# Patient Record
Sex: Female | Born: 1935
Health system: Southern US, Community
[De-identification: ages and names within clinical notes are randomized; demographics above are authoritative.]

## PROBLEM LIST (undated history)

## (undated) DIAGNOSIS — C50919 Malignant neoplasm of unspecified site of unspecified female breast: Secondary | ICD-10-CM

## (undated) DIAGNOSIS — K222 Esophageal obstruction: Secondary | ICD-10-CM

## (undated) DIAGNOSIS — F419 Anxiety disorder, unspecified: Secondary | ICD-10-CM

## (undated) DIAGNOSIS — F32A Depression, unspecified: Secondary | ICD-10-CM

## (undated) DIAGNOSIS — Z973 Presence of spectacles and contact lenses: Secondary | ICD-10-CM

## (undated) DIAGNOSIS — K219 Gastro-esophageal reflux disease without esophagitis: Secondary | ICD-10-CM

## (undated) DIAGNOSIS — I2510 Atherosclerotic heart disease of native coronary artery without angina pectoris: Secondary | ICD-10-CM

## (undated) DIAGNOSIS — I5189 Other ill-defined heart diseases: Secondary | ICD-10-CM

## (undated) DIAGNOSIS — K6289 Other specified diseases of anus and rectum: Secondary | ICD-10-CM

## (undated) DIAGNOSIS — H9190 Unspecified hearing loss, unspecified ear: Secondary | ICD-10-CM

## (undated) DIAGNOSIS — R0602 Shortness of breath: Secondary | ICD-10-CM

## (undated) DIAGNOSIS — Z972 Presence of dental prosthetic device (complete) (partial): Secondary | ICD-10-CM

## (undated) DIAGNOSIS — H5440 Blindness, one eye, unspecified eye: Secondary | ICD-10-CM

## (undated) DIAGNOSIS — N8189 Other female genital prolapse: Secondary | ICD-10-CM

## (undated) DIAGNOSIS — M1990 Unspecified osteoarthritis, unspecified site: Secondary | ICD-10-CM

## (undated) DIAGNOSIS — K529 Noninfective gastroenteritis and colitis, unspecified: Secondary | ICD-10-CM

## (undated) DIAGNOSIS — Z9889 Other specified postprocedural states: Secondary | ICD-10-CM

## (undated) DIAGNOSIS — F329 Major depressive disorder, single episode, unspecified: Secondary | ICD-10-CM

## (undated) DIAGNOSIS — IMO0002 Reserved for concepts with insufficient information to code with codable children: Secondary | ICD-10-CM

## (undated) HISTORY — PX: EYE SURGERY: SHX253

## (undated) HISTORY — PX: CORONARY STENT PLACEMENT: SHX1402

## (undated) HISTORY — PX: ABDOMINAL HYSTERECTOMY: SHX81

## (undated) HISTORY — DX: Other specified postprocedural states: Z98.890

## (undated) HISTORY — PX: APPENDECTOMY: SHX54

## (undated) HISTORY — DX: Unspecified osteoarthritis, unspecified site: M19.90

## (undated) HISTORY — PX: CHOLECYSTECTOMY: SHX55

## (undated) HISTORY — DX: Other specified diseases of anus and rectum: K62.89

## (undated) HISTORY — DX: Other ill-defined heart diseases: I51.89

## (undated) HISTORY — DX: Malignant neoplasm of unspecified site of unspecified female breast: C50.919

## (undated) HISTORY — DX: Noninfective gastroenteritis and colitis, unspecified: K52.9

## (undated) HISTORY — DX: Reserved for concepts with insufficient information to code with codable children: IMO0002

## (undated) HISTORY — DX: Esophageal obstruction: K22.2

## (undated) HISTORY — DX: Gastro-esophageal reflux disease without esophagitis: K21.9

## (undated) HISTORY — DX: Other female genital prolapse: N81.89

---

## 1976-11-25 HISTORY — PX: RETINAL DETACHMENT SURGERY: SHX105

## 1991-11-26 HISTORY — PX: LUMBAR DISC SURGERY: SHX700

## 1996-11-25 HISTORY — PX: CARDIAC CATHETERIZATION: SHX172

## 2001-03-06 ENCOUNTER — Ambulatory Visit (HOSPITAL_COMMUNITY): Admission: RE | Admit: 2001-03-06 | Discharge: 2001-03-06 | Payer: Self-pay | Admitting: Internal Medicine

## 2001-03-06 ENCOUNTER — Encounter: Payer: Self-pay | Admitting: Internal Medicine

## 2001-04-14 ENCOUNTER — Ambulatory Visit (HOSPITAL_COMMUNITY): Admission: RE | Admit: 2001-04-14 | Discharge: 2001-04-14 | Payer: Self-pay | Admitting: Internal Medicine

## 2001-04-14 ENCOUNTER — Encounter: Payer: Self-pay | Admitting: Internal Medicine

## 2001-09-18 ENCOUNTER — Ambulatory Visit (HOSPITAL_COMMUNITY): Admission: RE | Admit: 2001-09-18 | Discharge: 2001-09-18 | Payer: Self-pay | Admitting: Internal Medicine

## 2001-09-18 ENCOUNTER — Encounter: Payer: Self-pay | Admitting: Internal Medicine

## 2001-09-22 ENCOUNTER — Ambulatory Visit (HOSPITAL_COMMUNITY): Admission: RE | Admit: 2001-09-22 | Discharge: 2001-09-22 | Payer: Self-pay | Admitting: Internal Medicine

## 2001-09-22 ENCOUNTER — Encounter: Payer: Self-pay | Admitting: Internal Medicine

## 2001-12-23 ENCOUNTER — Ambulatory Visit (HOSPITAL_COMMUNITY): Admission: RE | Admit: 2001-12-23 | Discharge: 2001-12-23 | Payer: Self-pay | Admitting: Internal Medicine

## 2001-12-23 ENCOUNTER — Encounter: Payer: Self-pay | Admitting: Internal Medicine

## 2002-06-03 ENCOUNTER — Inpatient Hospital Stay (HOSPITAL_COMMUNITY): Admission: AD | Admit: 2002-06-03 | Discharge: 2002-06-06 | Payer: Self-pay | Admitting: Internal Medicine

## 2002-06-03 ENCOUNTER — Encounter: Payer: Self-pay | Admitting: Internal Medicine

## 2002-09-30 ENCOUNTER — Emergency Department (HOSPITAL_COMMUNITY): Admission: EM | Admit: 2002-09-30 | Discharge: 2002-09-30 | Payer: Self-pay | Admitting: Emergency Medicine

## 2003-02-22 ENCOUNTER — Encounter: Payer: Self-pay | Admitting: Internal Medicine

## 2003-02-22 ENCOUNTER — Ambulatory Visit (HOSPITAL_COMMUNITY): Admission: RE | Admit: 2003-02-22 | Discharge: 2003-02-22 | Payer: Self-pay | Admitting: Internal Medicine

## 2003-05-17 ENCOUNTER — Ambulatory Visit (HOSPITAL_COMMUNITY): Admission: RE | Admit: 2003-05-17 | Discharge: 2003-05-17 | Payer: Self-pay | Admitting: Internal Medicine

## 2003-05-17 ENCOUNTER — Encounter: Payer: Self-pay | Admitting: Internal Medicine

## 2003-08-18 ENCOUNTER — Encounter: Payer: Self-pay | Admitting: Internal Medicine

## 2003-08-18 ENCOUNTER — Ambulatory Visit (HOSPITAL_COMMUNITY): Admission: RE | Admit: 2003-08-18 | Discharge: 2003-08-18 | Payer: Self-pay | Admitting: Internal Medicine

## 2003-09-06 ENCOUNTER — Ambulatory Visit (HOSPITAL_COMMUNITY): Admission: RE | Admit: 2003-09-06 | Discharge: 2003-09-06 | Payer: Self-pay | Admitting: Internal Medicine

## 2003-09-06 ENCOUNTER — Encounter: Payer: Self-pay | Admitting: Internal Medicine

## 2003-09-15 ENCOUNTER — Encounter: Payer: Self-pay | Admitting: Internal Medicine

## 2003-09-15 ENCOUNTER — Ambulatory Visit (HOSPITAL_COMMUNITY): Admission: RE | Admit: 2003-09-15 | Discharge: 2003-09-15 | Payer: Self-pay | Admitting: Internal Medicine

## 2004-01-18 ENCOUNTER — Ambulatory Visit (HOSPITAL_COMMUNITY): Admission: RE | Admit: 2004-01-18 | Discharge: 2004-01-18 | Payer: Self-pay | Admitting: Internal Medicine

## 2004-01-18 IMAGING — CR DG CHEST 2V
2 series · 2 of 2 positions shown · non-contrast
Comparison: none

CLINICAL DATA: Bronchitis.
 PA AND LATERAL CHEST
 Comparison [DATE].  
 The heart size and vascularity are normal.  There is some chronic accentuation of the interstitial markings in the right lung, more prominent than on the prior study of [DATE] but there are no consolidative infiltrates or effusions.  There is some minimal scarring at the left lung base which is stable.  Interstitial markings are accentuated in the right upper lobe as well on a chronic basis.  
 IMPRESSION
 Accentuated interstitial markings in the right lung, progressed since the prior study.  No consolidative infiltrates. The findings are consistent with progressive chronic bronchitis, on the right.

[view not recorded (1 of 2)]
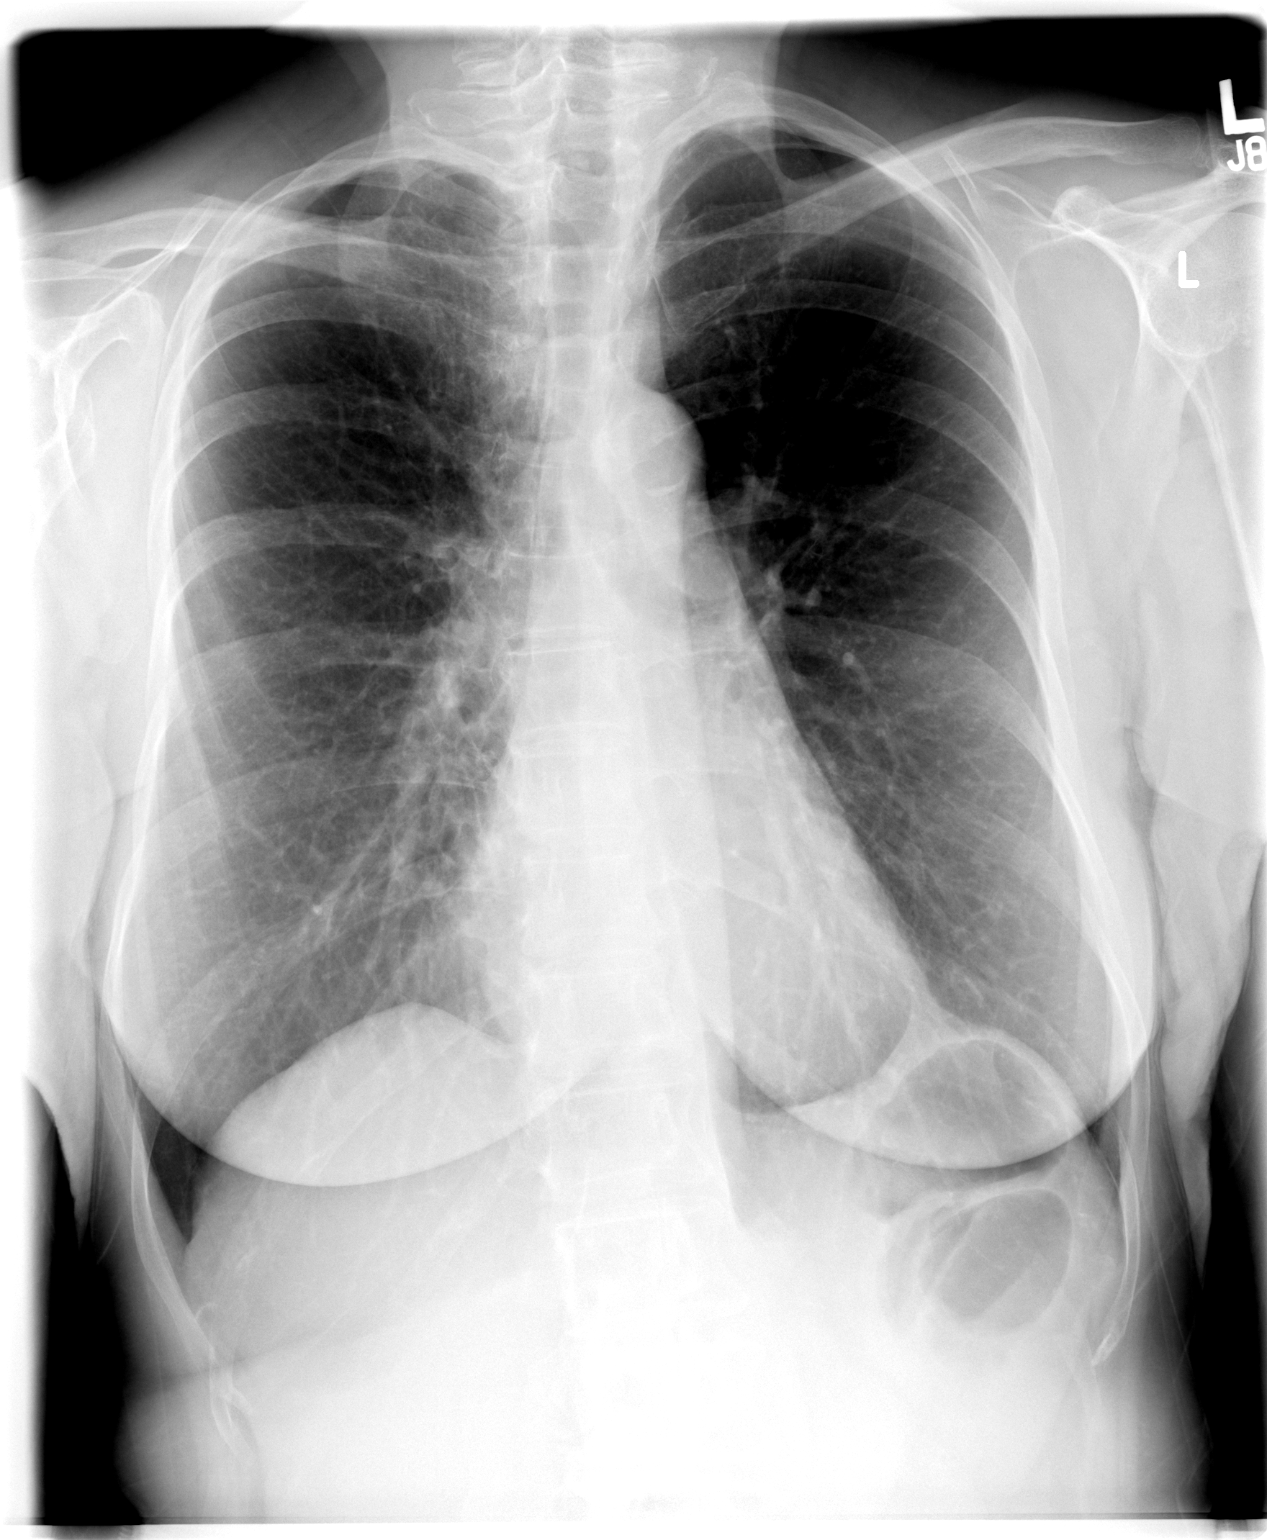

[view not recorded (2 of 2)]
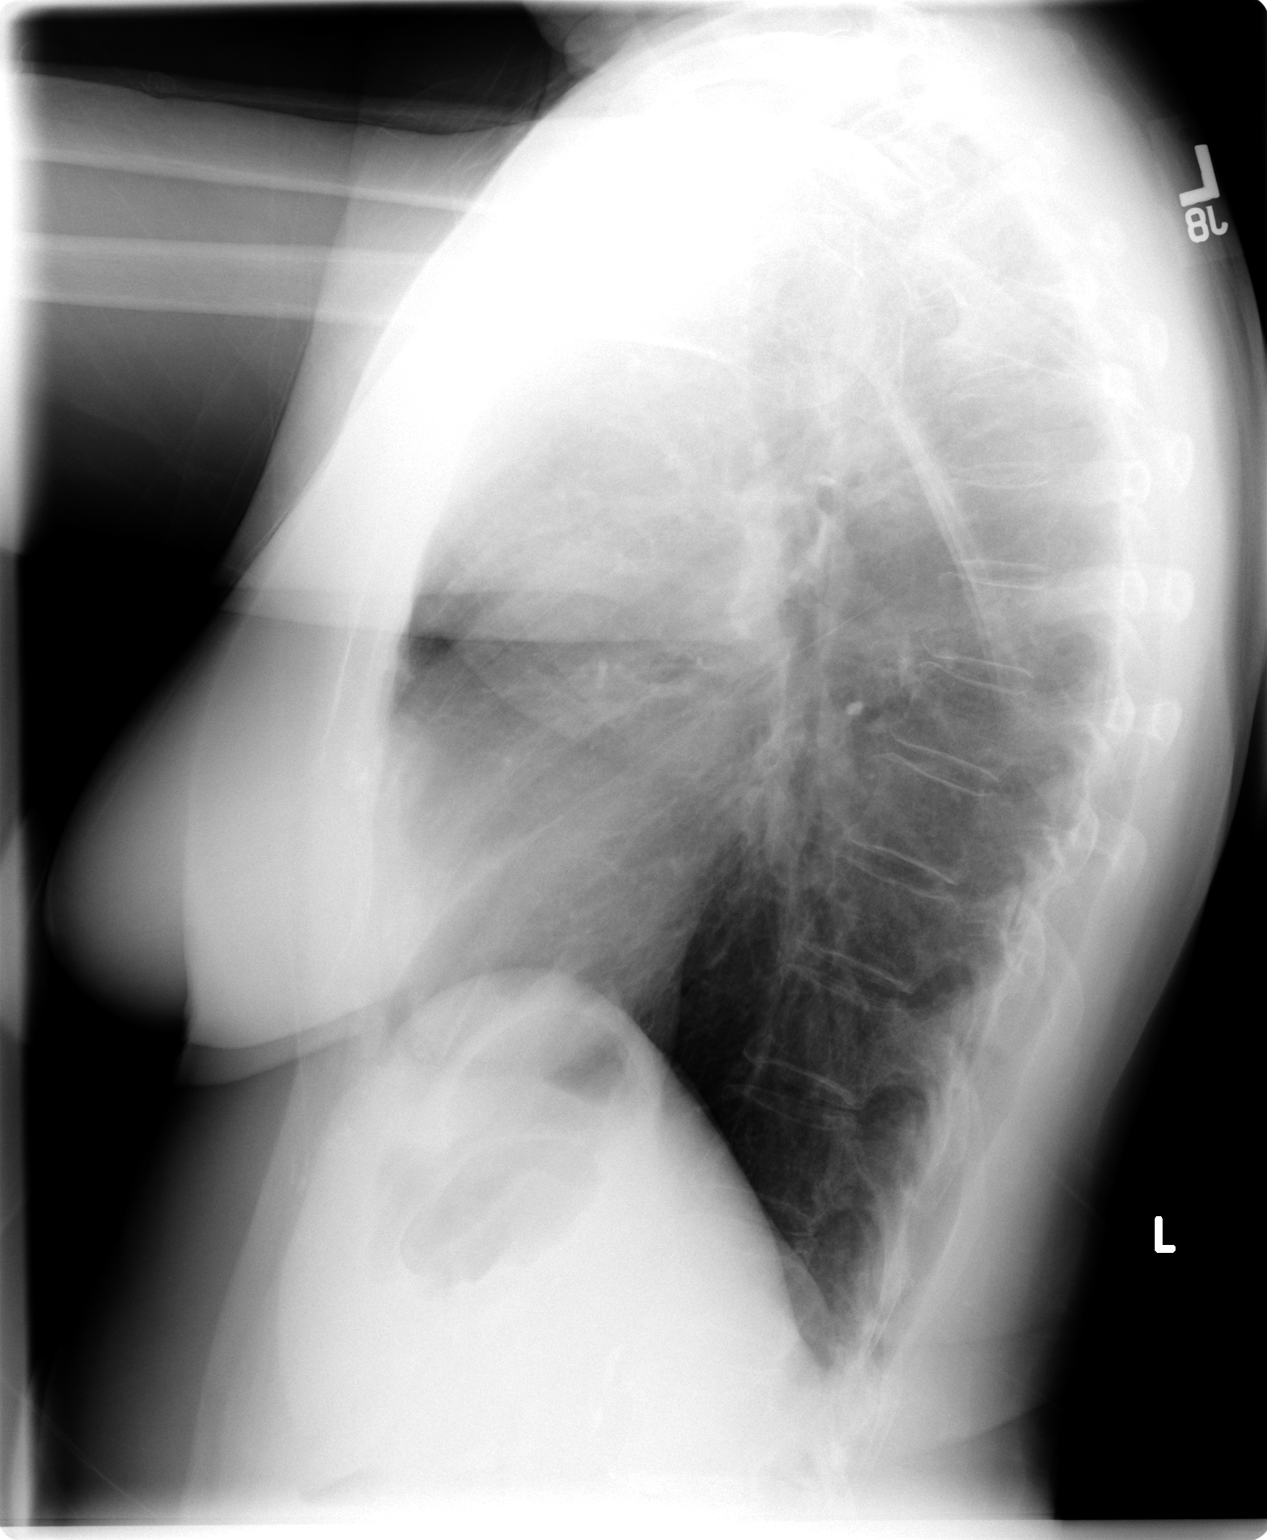

[2 of 2 positions shown; findings below may reference images not displayed]

## 2004-06-05 ENCOUNTER — Ambulatory Visit (HOSPITAL_COMMUNITY): Admission: RE | Admit: 2004-06-05 | Discharge: 2004-06-05 | Payer: Self-pay | Admitting: Internal Medicine

## 2004-06-05 IMAGING — CT CT HEAD W/O CM
1 series · 16 of 30 positions shown, 20 images · non-contrast
Comparison: none

CLINICAL DATA: Sinusitis, headache.
 CT HEAD WITHOUT CONTRAST 
 Routine noncontrast CT head compared to [DATE].  Mild atrophy.  Normal ventricular morphology.  No midline shift or mass effect.  Normal appearance of brain parenchyma without mass, hemorrhage, or infarct.  No extraaxial fluid collection.  Small air fluid level, right maxillary sinus.  Bones unremarkable. 
 IMPRESSION
 Right maxillary sinusitis.  No acute intracranial abnormalities.

[Series 9271: — · axial · 0.43mm/px · z∈[-753,-618]mm · 16 of 30 slices shown, 20 images]
[im 2/30  brain]
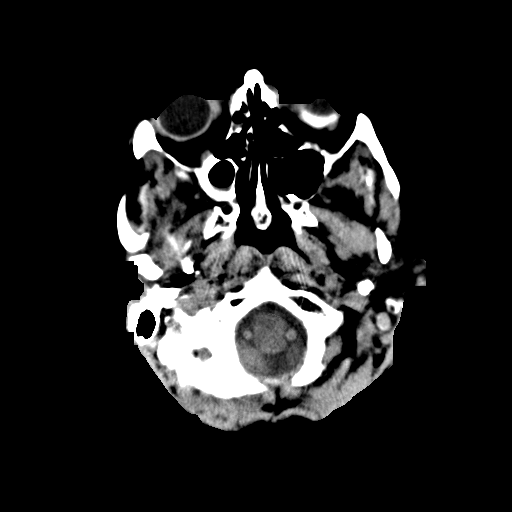
[im 2/30  bone]
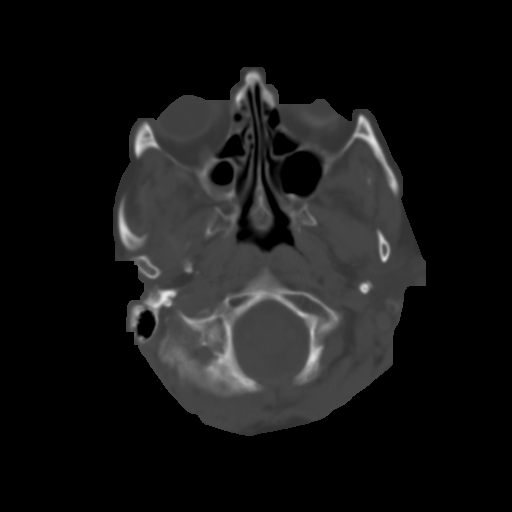
[im 4/30  brain]
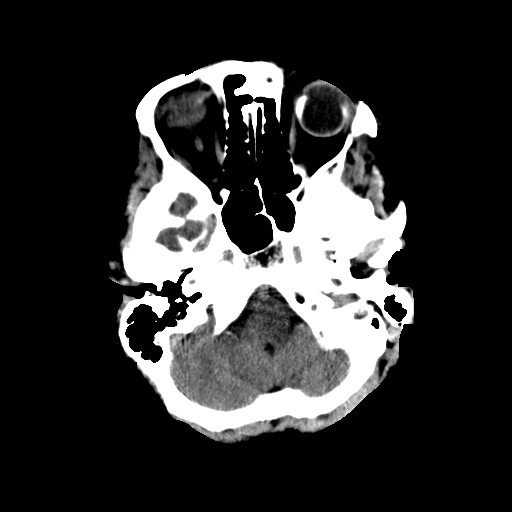
[im 6/30  brain]
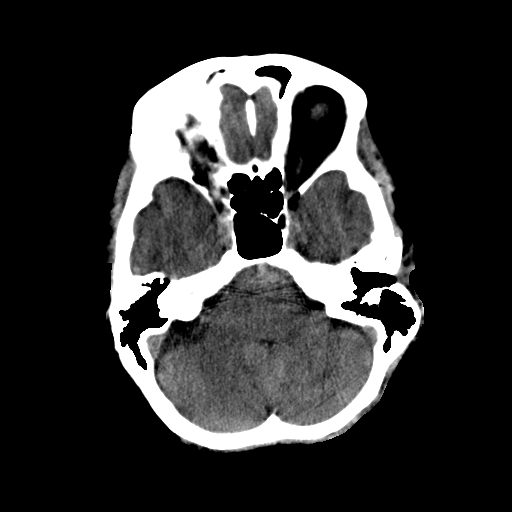
[im 8/30  brain]
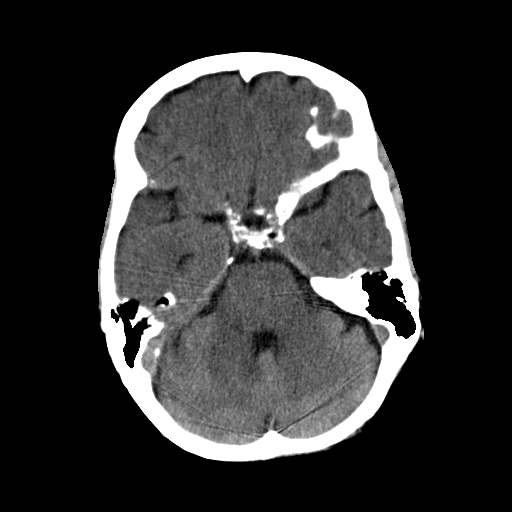
[im 9/30  brain]
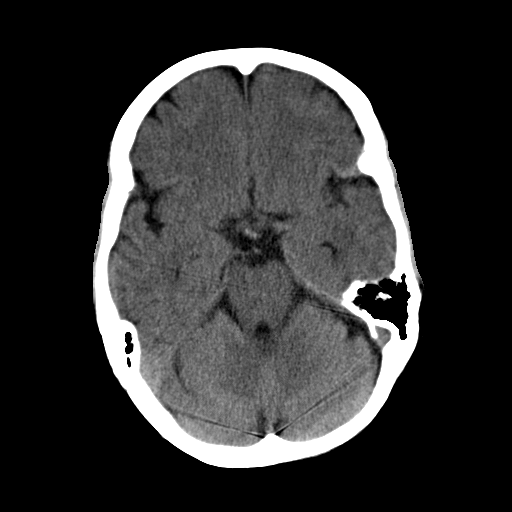
[im 9/30  bone]
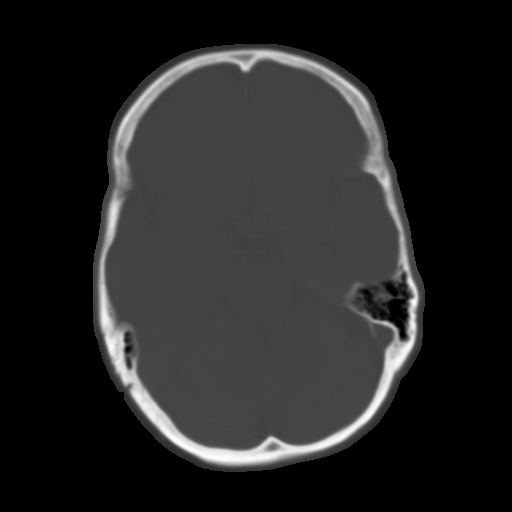
[im 11/30  brain]
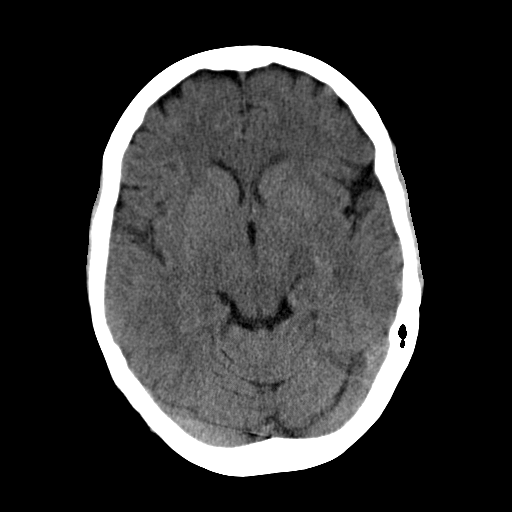
[im 13/30  brain]
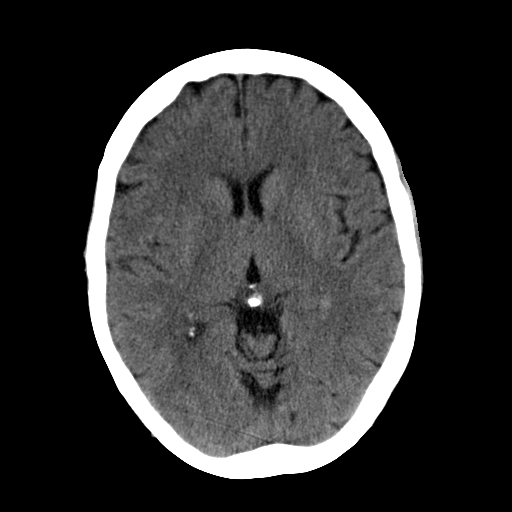
[im 15/30  brain]
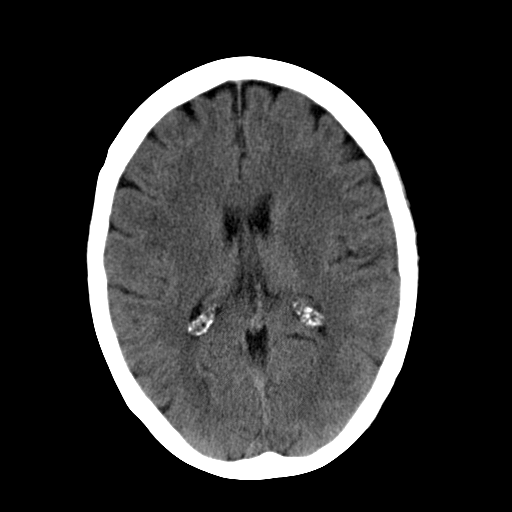
[im 16/30  brain]
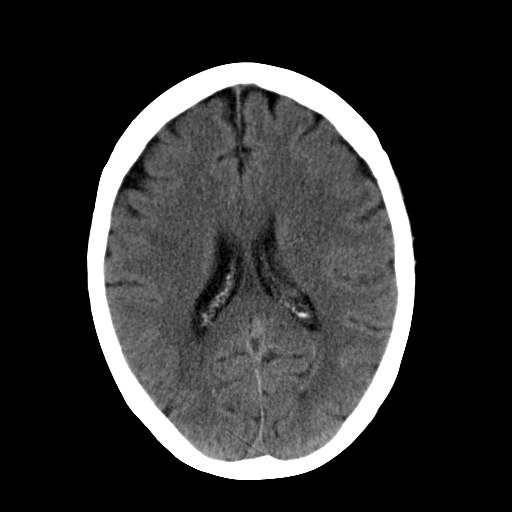
[im 16/30  bone]
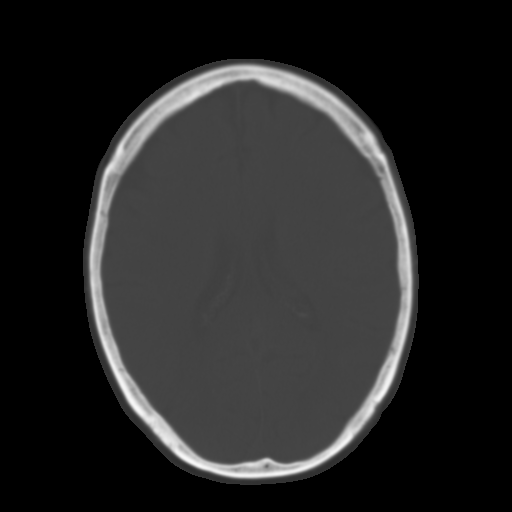
[im 18/30  brain]
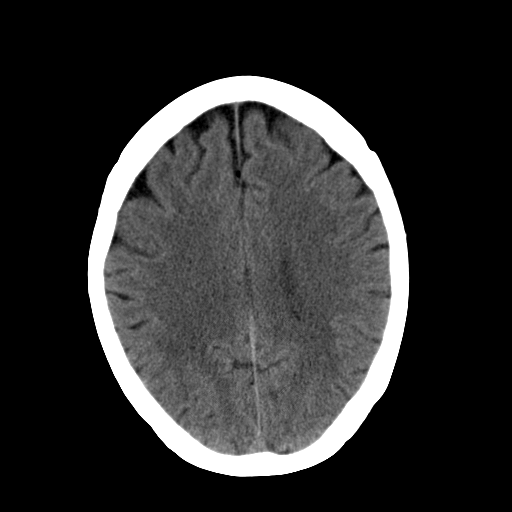
[im 20/30  brain]
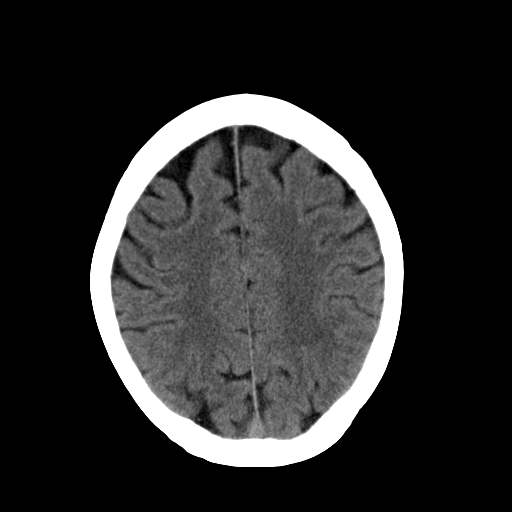
[im 22/30  brain]
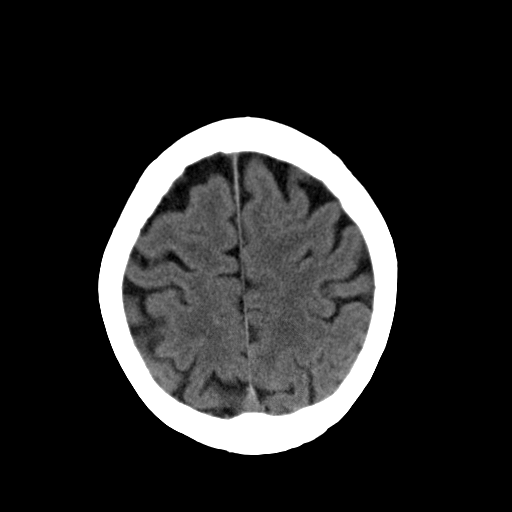
[im 23/30  brain]
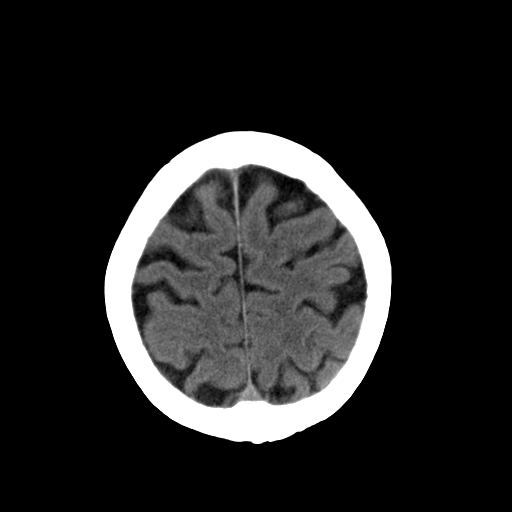
[im 23/30  bone]
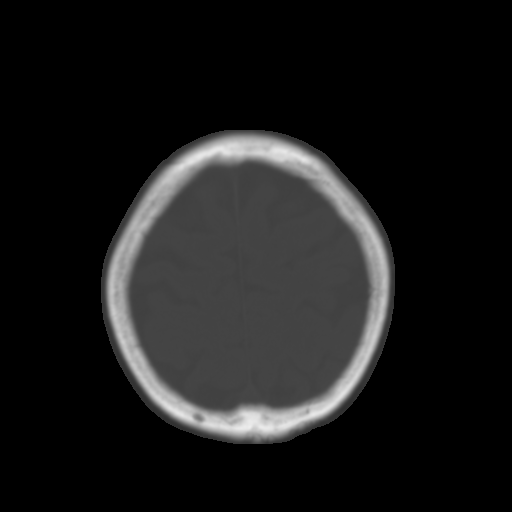
[im 25/30  brain]
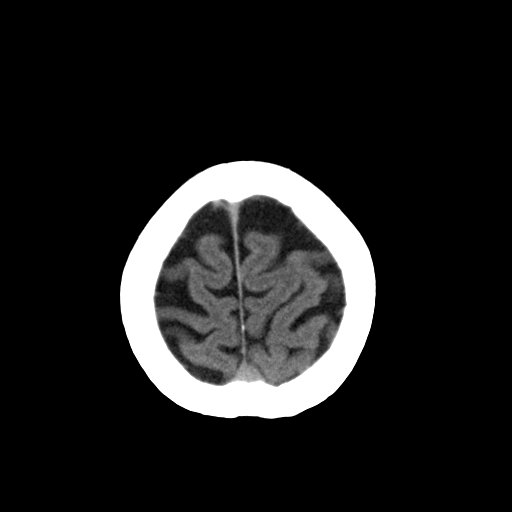
[im 27/30  brain]
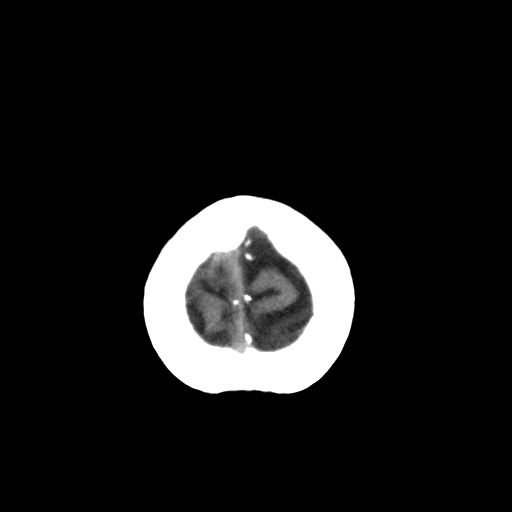
[im 29/30  brain]
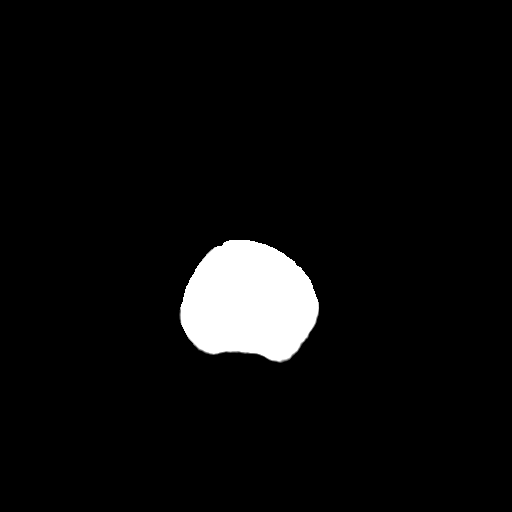

[16 of 30 positions shown; findings below may reference images not displayed]

## 2004-10-25 ENCOUNTER — Ambulatory Visit (HOSPITAL_COMMUNITY): Admission: RE | Admit: 2004-10-25 | Discharge: 2004-10-25 | Payer: Self-pay | Admitting: Internal Medicine

## 2005-07-01 ENCOUNTER — Ambulatory Visit (HOSPITAL_COMMUNITY): Admission: RE | Admit: 2005-07-01 | Discharge: 2005-07-01 | Payer: Self-pay | Admitting: Internal Medicine

## 2005-07-01 IMAGING — CT CT HEAD W/O CM
1 series · 16 of 30 positions shown, 20 images · IV contrast (agent unspecified)
Comparison: [DATE].

CLINICAL DATA: Headaches/struck top of head on cabinet. 
 HEAD CT WITHOUT CONTRAST:
TECHNIQUE: 5mm collimated images were obtained from the base of the skull through the vertex according to standard protocol without contrast.

[Series 6471: — · axial · 0.45mm/px · z∈[-665,-530]mm · 16 of 30 slices shown, 20 images]
[im 2/30  brain]
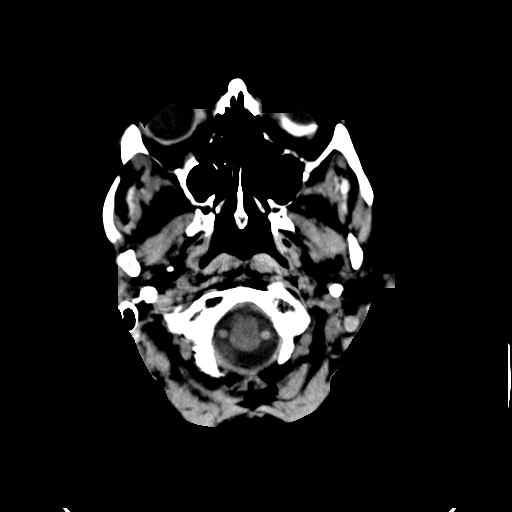
[im 2/30  bone]
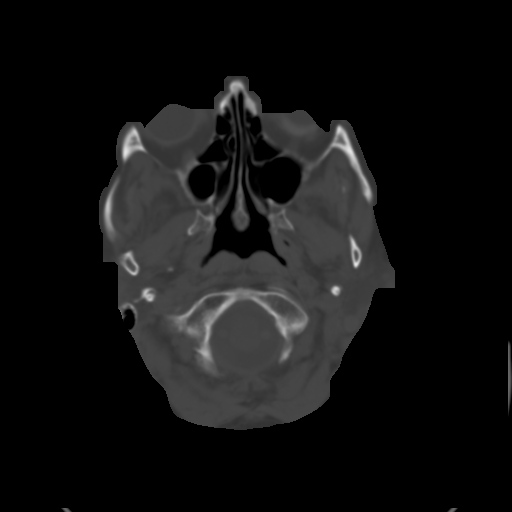
[im 4/30  brain]
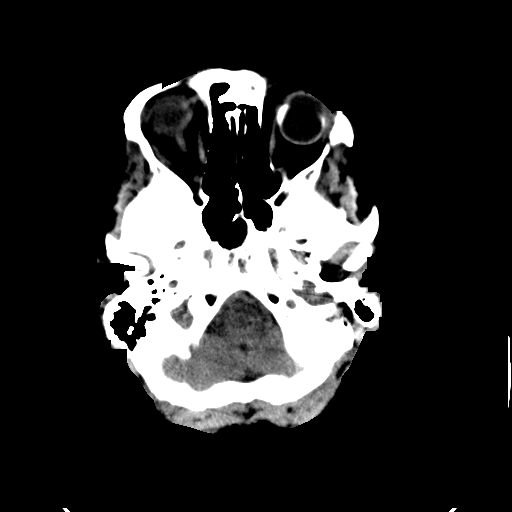
[im 6/30  brain]
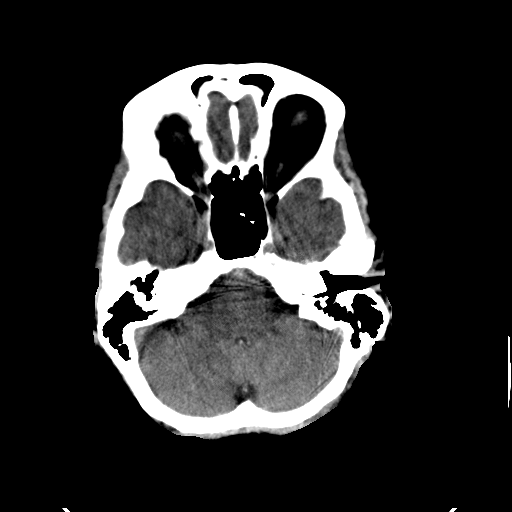
[im 8/30  brain]
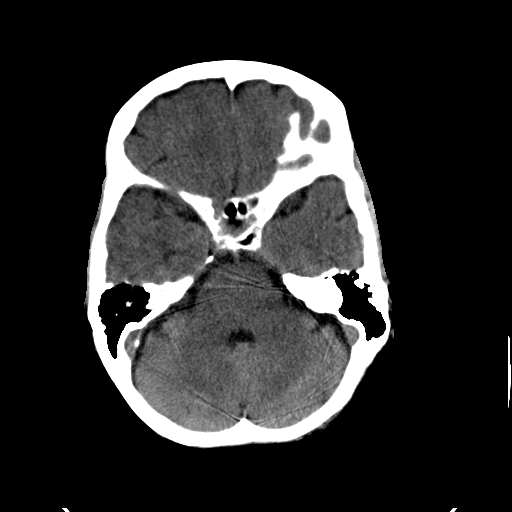
[im 9/30  brain]
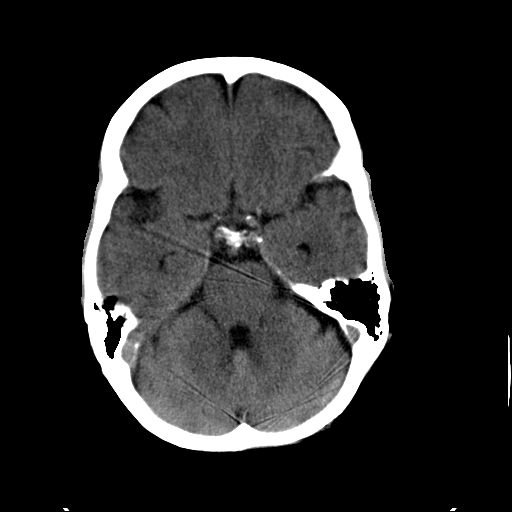
[im 9/30  bone]
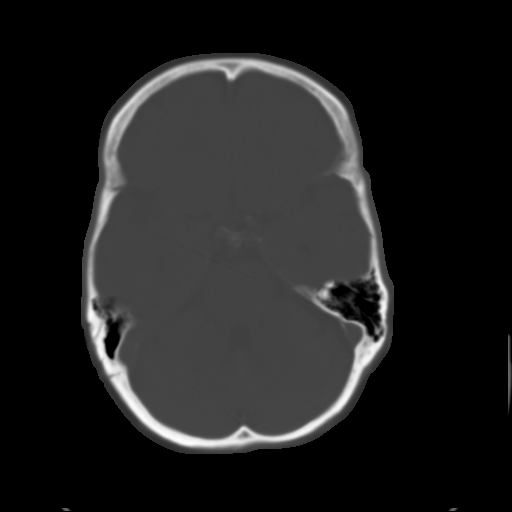
[im 11/30  brain]
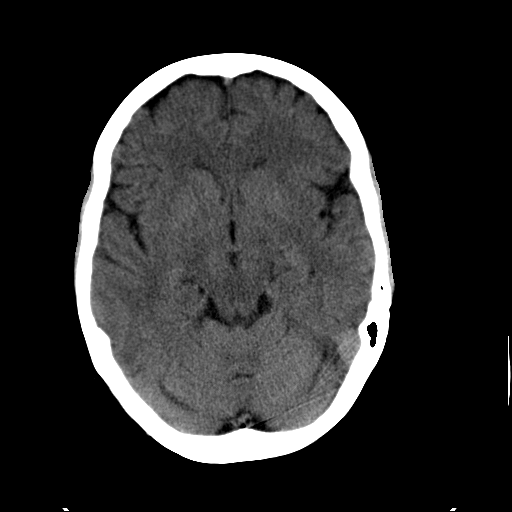
[im 13/30  brain]
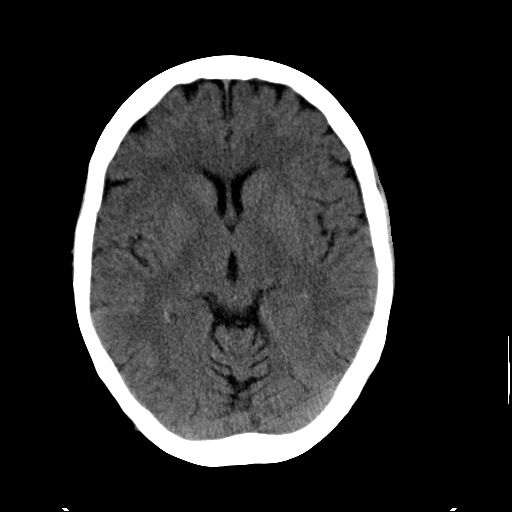
[im 15/30  brain]
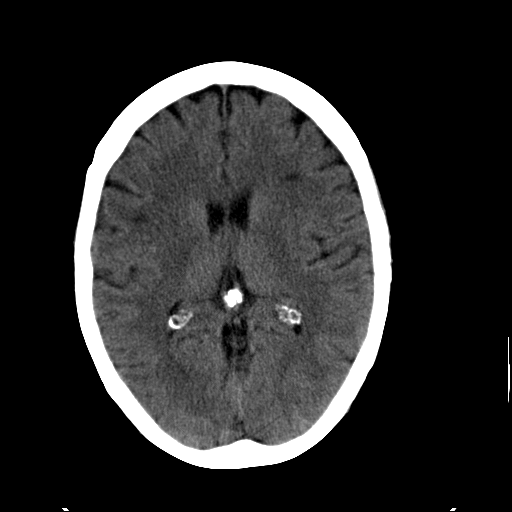
[im 16/30  brain]
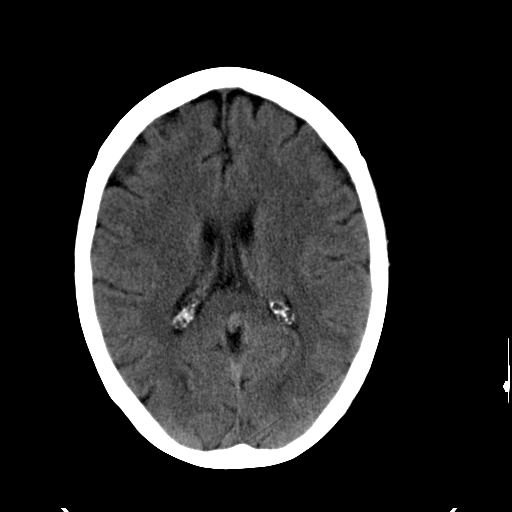
[im 16/30  bone]
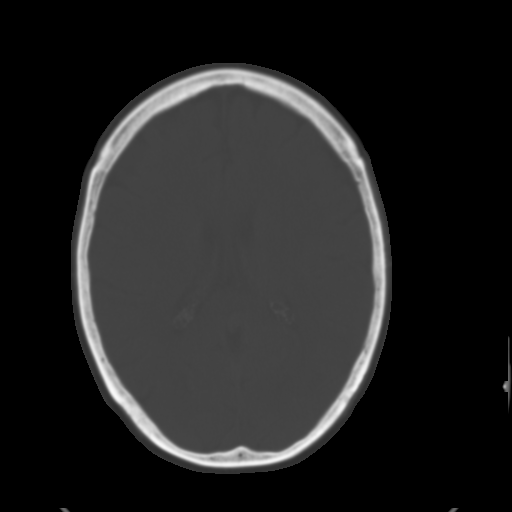
[im 18/30  brain]
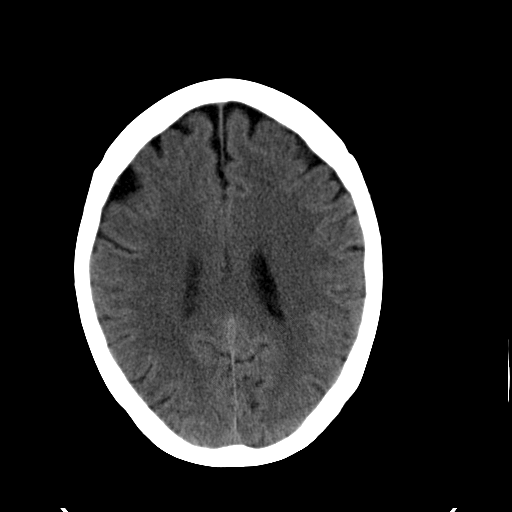
[im 20/30  brain]
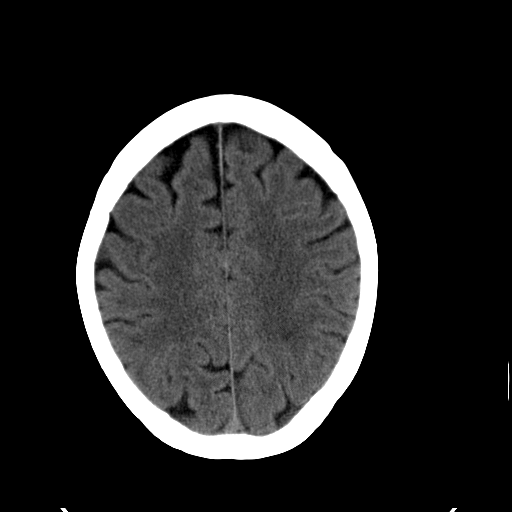
[im 22/30  brain]
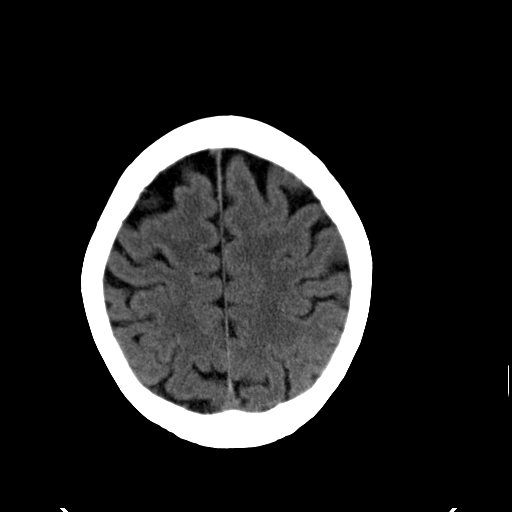
[im 23/30  brain]
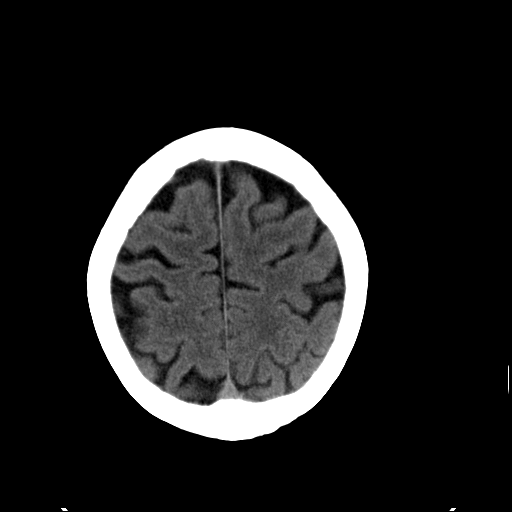
[im 23/30  bone]
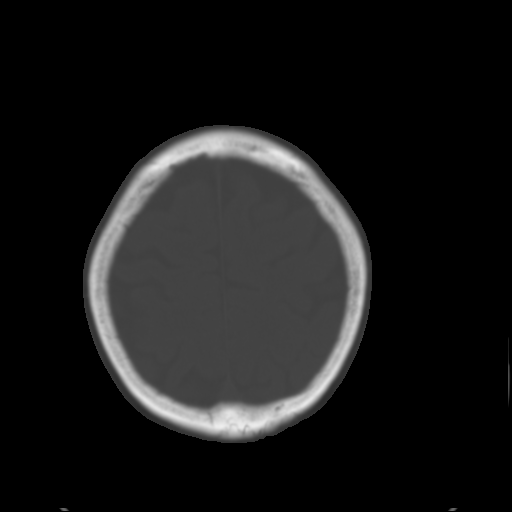
[im 25/30  brain]
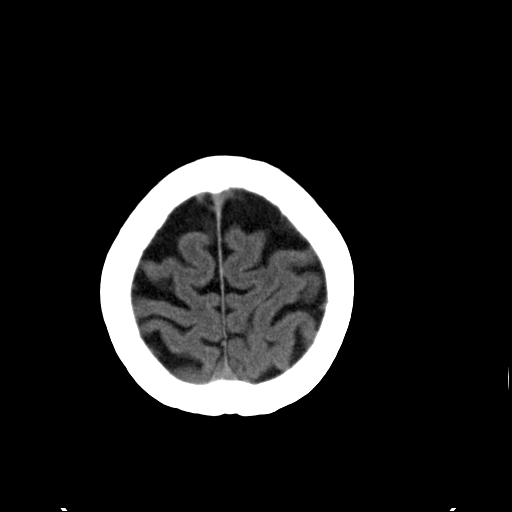
[im 27/30  brain]
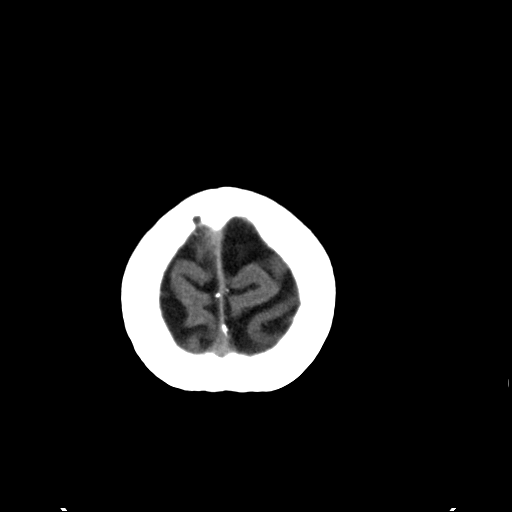
[im 29/30  brain]
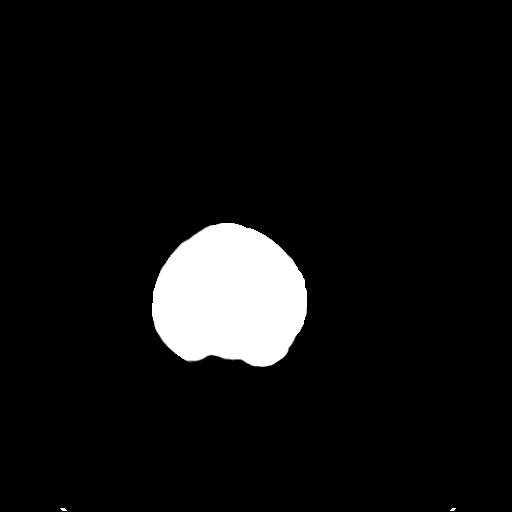

[16 of 30 positions shown; findings below may reference images not displayed]

No obvious acute abnormality. There is a small lucency in the white matter of the left frontal lobe, seen on image #15. This may be a new finding since the prior study, although it is possible that it was present but less obvious.  
 Calvarium intact.  Right maxillary sinusitis has resolved since the prior exam.
IMPRESSION: Probable small infarct in the white matter of the left frontal lobe.  Probably present previously but more prominent now.  Otherwise unremarkable.

## 2005-09-12 ENCOUNTER — Other Ambulatory Visit: Admission: RE | Admit: 2005-09-12 | Discharge: 2005-09-12 | Payer: Self-pay | Admitting: Unknown Physician Specialty

## 2005-12-30 ENCOUNTER — Ambulatory Visit (HOSPITAL_COMMUNITY): Admission: RE | Admit: 2005-12-30 | Discharge: 2005-12-30 | Payer: Self-pay | Admitting: Internal Medicine

## 2006-01-02 ENCOUNTER — Encounter: Admission: RE | Admit: 2006-01-02 | Discharge: 2006-01-02 | Payer: Self-pay | Admitting: Internal Medicine

## 2006-01-02 IMAGING — MG MM MAMMO DIAG UNILATERAL*L*
2 series · 2 of 2 positions shown · non-contrast
Comparison: none

DIGITAL UNILATERAL LIMITED LEFT DIAGNOSTIC MAMMOGRAM AND LEFT BREAST ULTRASOUND:
CLINICAL DATA: Screening callback for left lower inner quadrant density.

Comparison is made to multiple prior studies dating back as distantly as [S2].  The most recent 
screening mammogram from [HOSPITAL] dated [DATE] is reviewed.

[L CC]
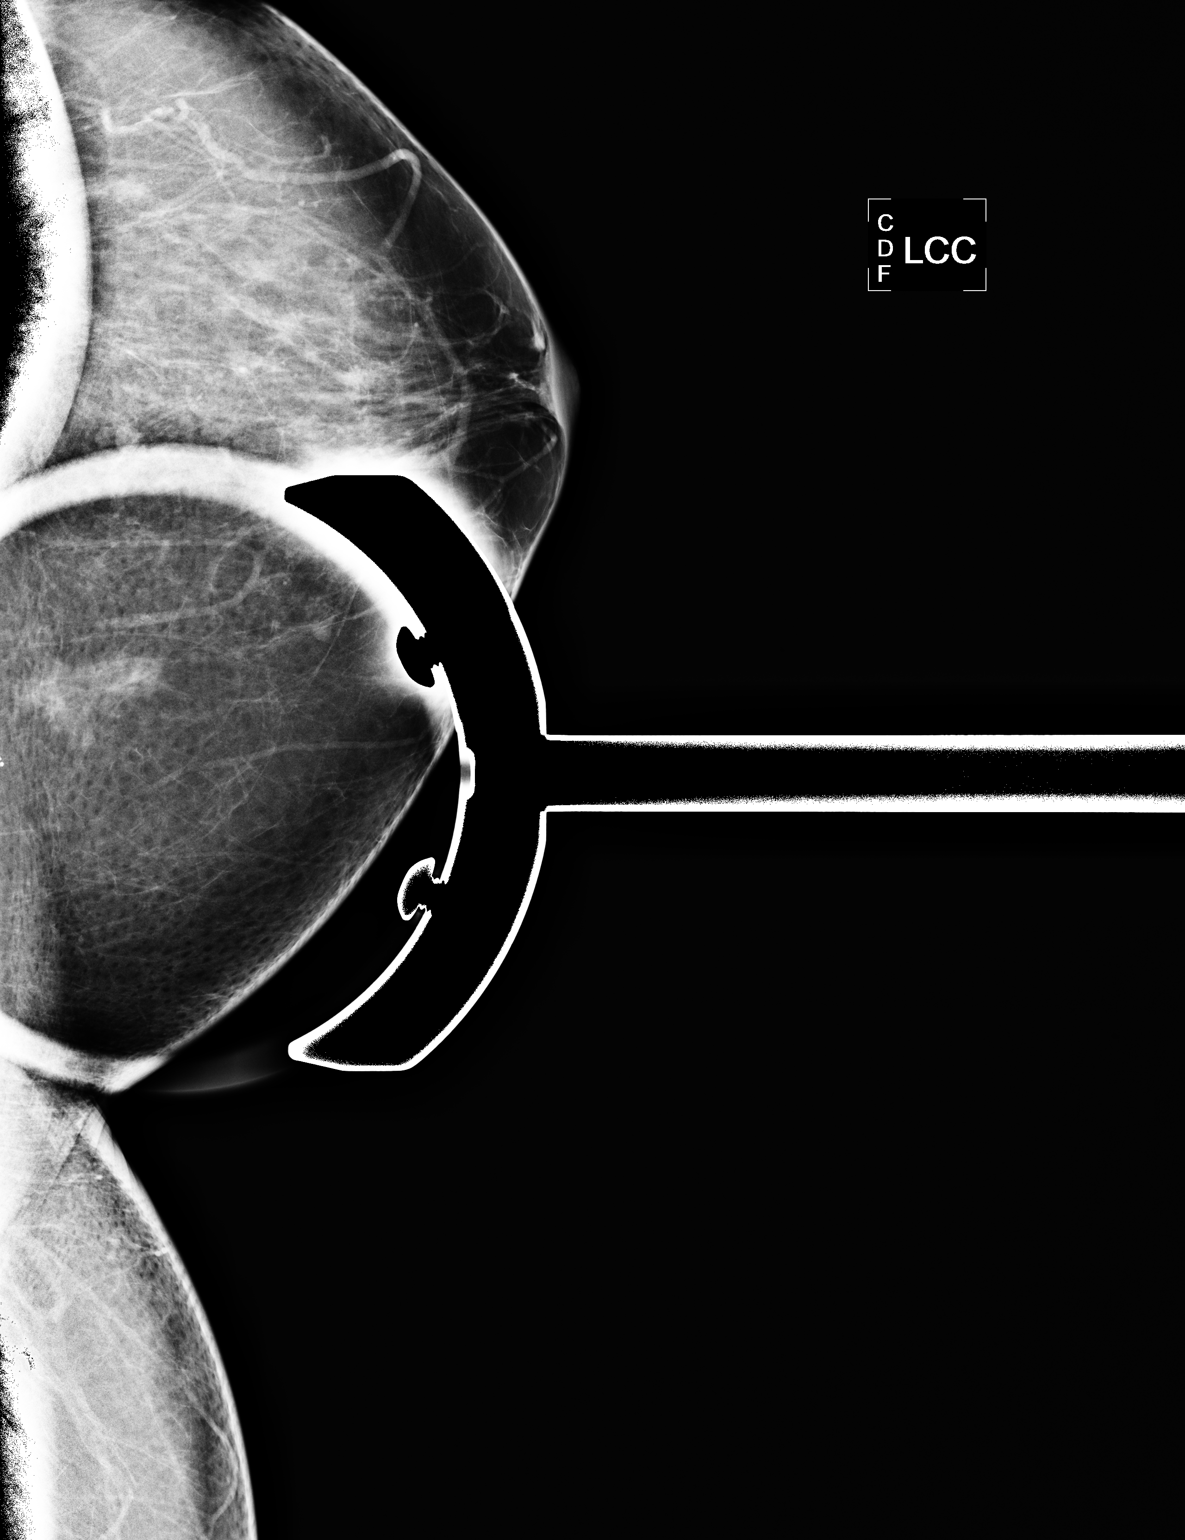

[L MLO]
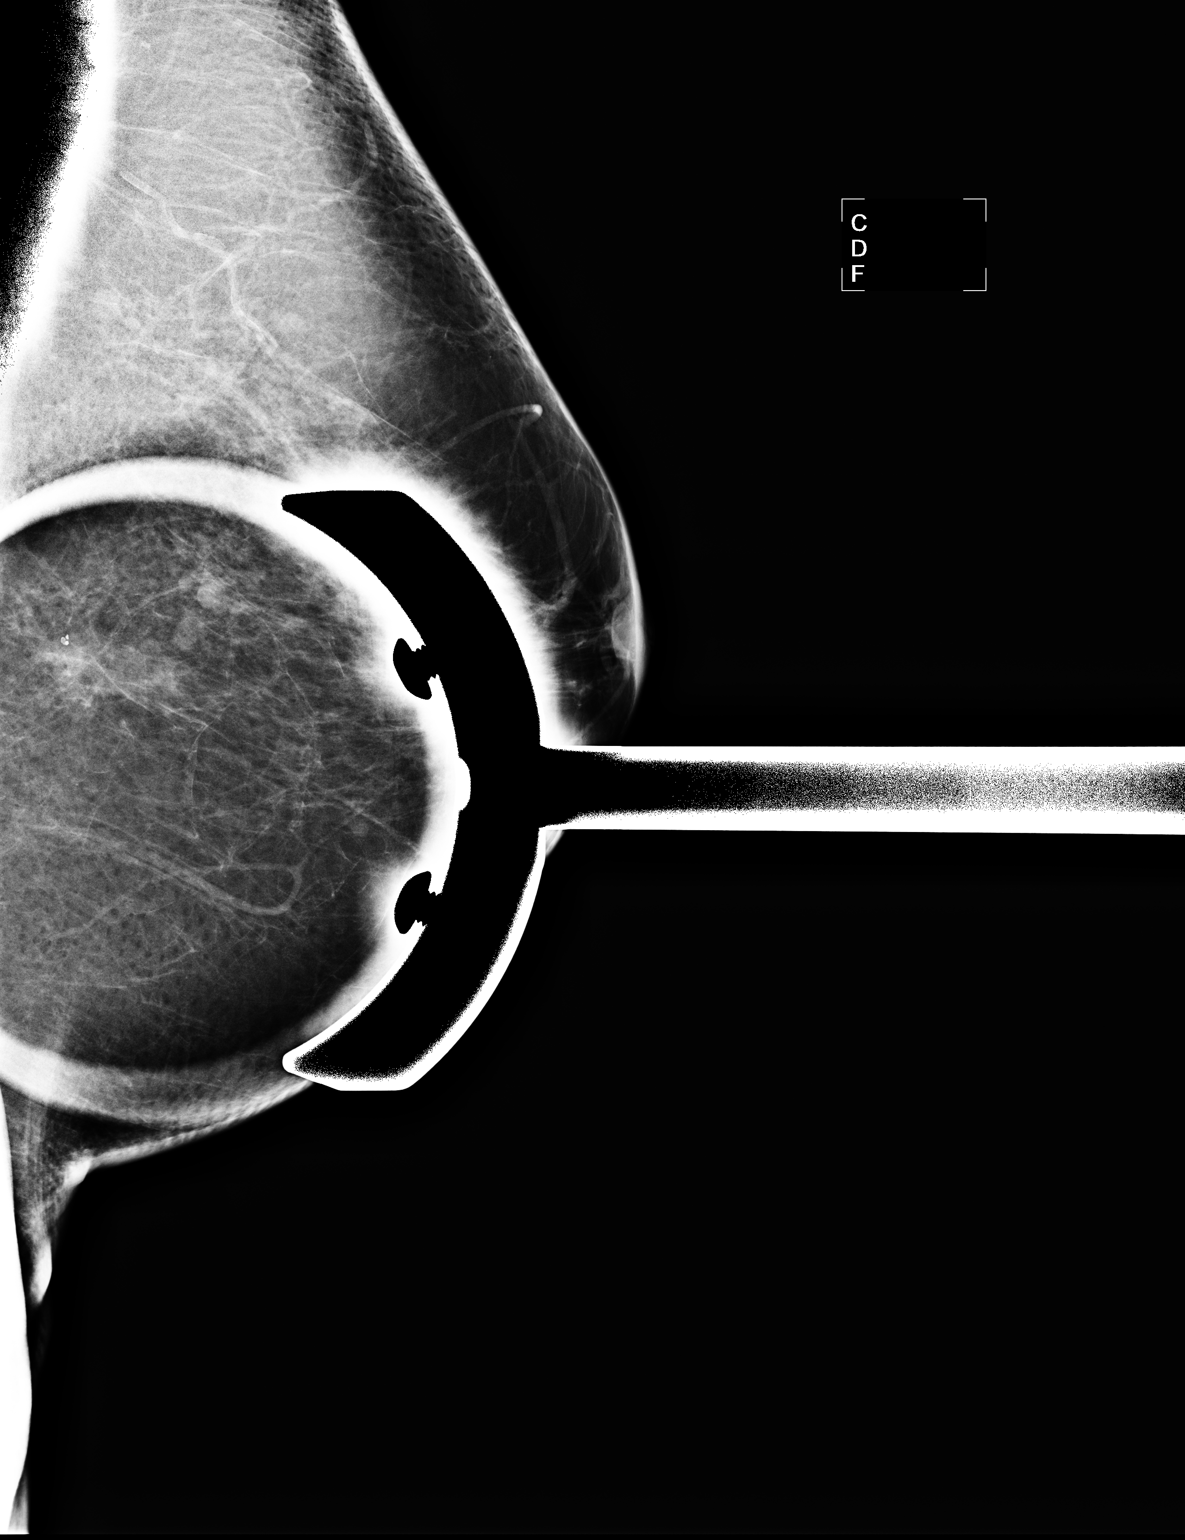

[2 of 2 positions shown; findings below may reference images not displayed]

FINDINGS: Spot compression mammography demonstrates change in conformation and pliability of the 
lower inner quadrant density, suggestive of benign-appearing glandular tissue.  Increased density 
is present in this area dating back to [S2]. However, this may have appeared to have changed on the
screening mammogram due to differences in technique and breast composition overall.

Ultrasound of the left breast lower inner quadrant demonstrates no sonographic abnormality.
IMPRESSION: Probably benign area of normal-appearing glandular tissue, left lower inner quadrant.  Six-month 
left diagnostic mammographic followup is recommended.

ASSESSMENT: Probably benign - BI-RADS 3

Diagnostic mammogram of the left breast in 6 months.

## 2006-05-06 ENCOUNTER — Encounter (INDEPENDENT_AMBULATORY_CARE_PROVIDER_SITE_OTHER): Payer: Self-pay | Admitting: *Deleted

## 2006-05-06 ENCOUNTER — Ambulatory Visit: Payer: Self-pay | Admitting: Internal Medicine

## 2006-05-06 ENCOUNTER — Ambulatory Visit (HOSPITAL_COMMUNITY): Admission: RE | Admit: 2006-05-06 | Discharge: 2006-05-06 | Payer: Self-pay | Admitting: Internal Medicine

## 2006-05-06 HISTORY — PX: COLONOSCOPY: SHX174

## 2006-06-05 ENCOUNTER — Ambulatory Visit (HOSPITAL_COMMUNITY): Admission: RE | Admit: 2006-06-05 | Discharge: 2006-06-05 | Payer: Self-pay | Admitting: Internal Medicine

## 2006-06-05 IMAGING — CR DG HIP W/ PELVIS BILAT
5 series · 5 of 5 positions shown · non-contrast
Comparison: none

CLINICAL DATA: Hip, back, and chest pain.  No known injury. 
 HIP BILATERAL WITH PELVIS ? 5 VIEWS (TOTAL):
 Single view of the pelvis and two views of each hip.

[view not recorded (1 of 5)]
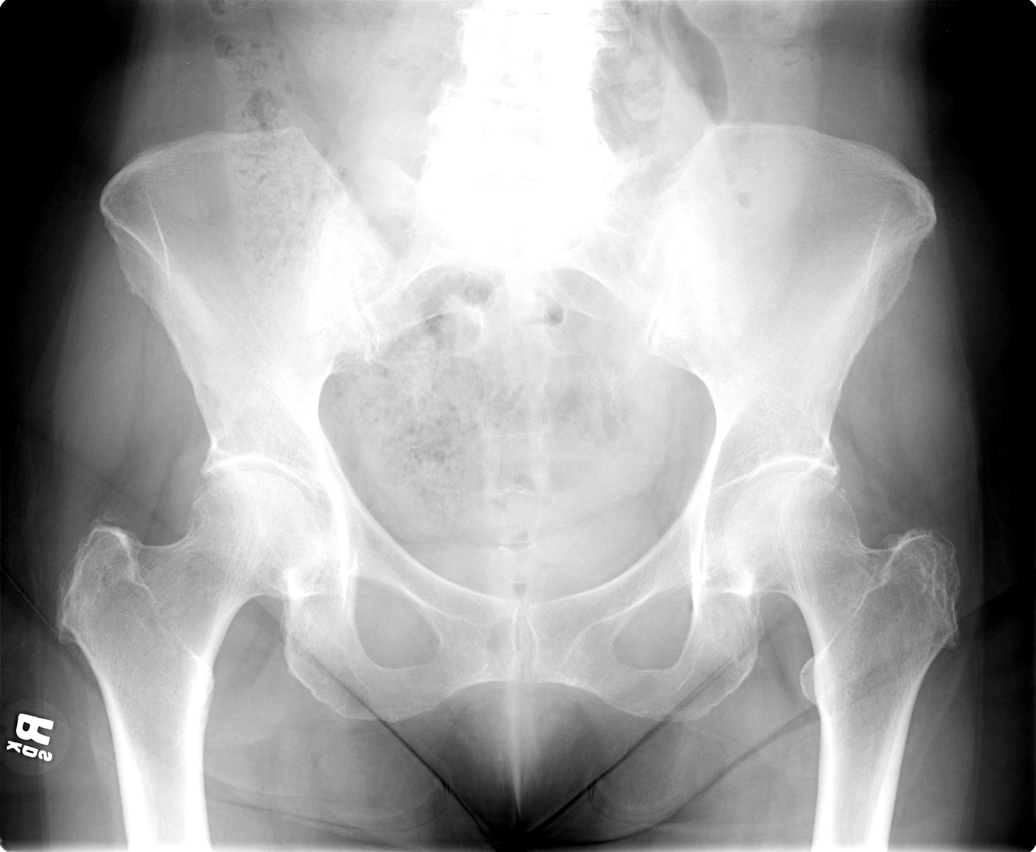

[view not recorded (2 of 5)]
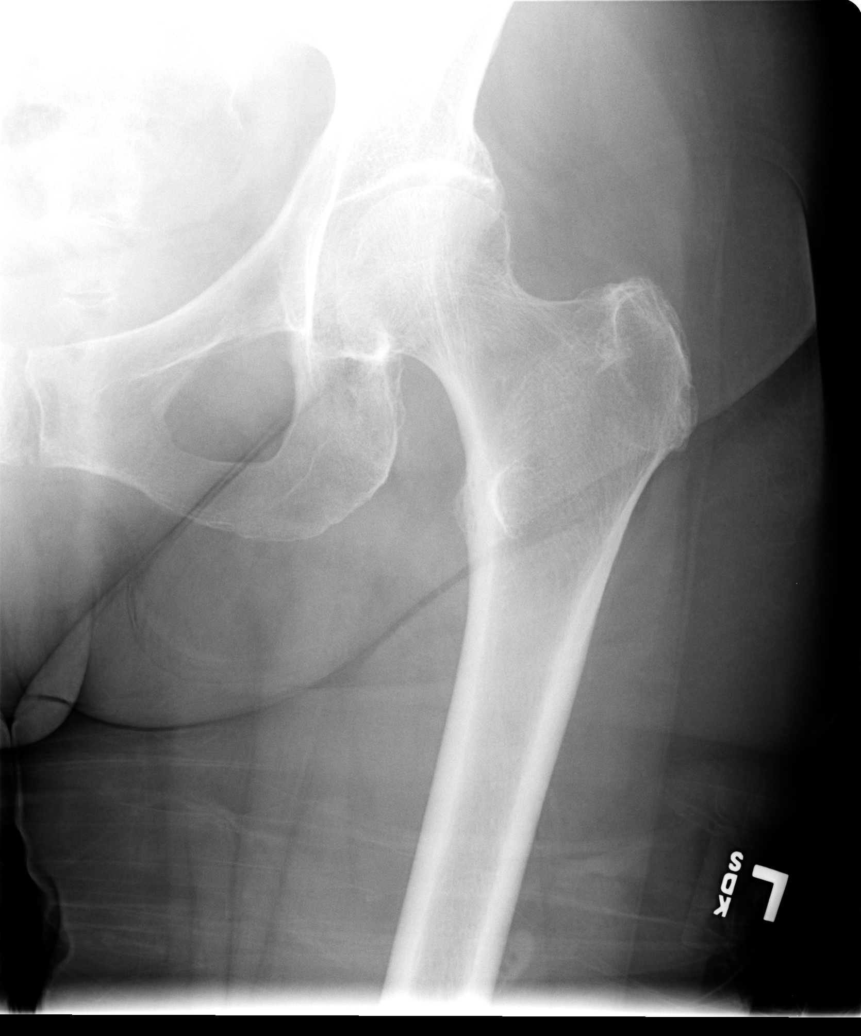

[view not recorded (3 of 5)]
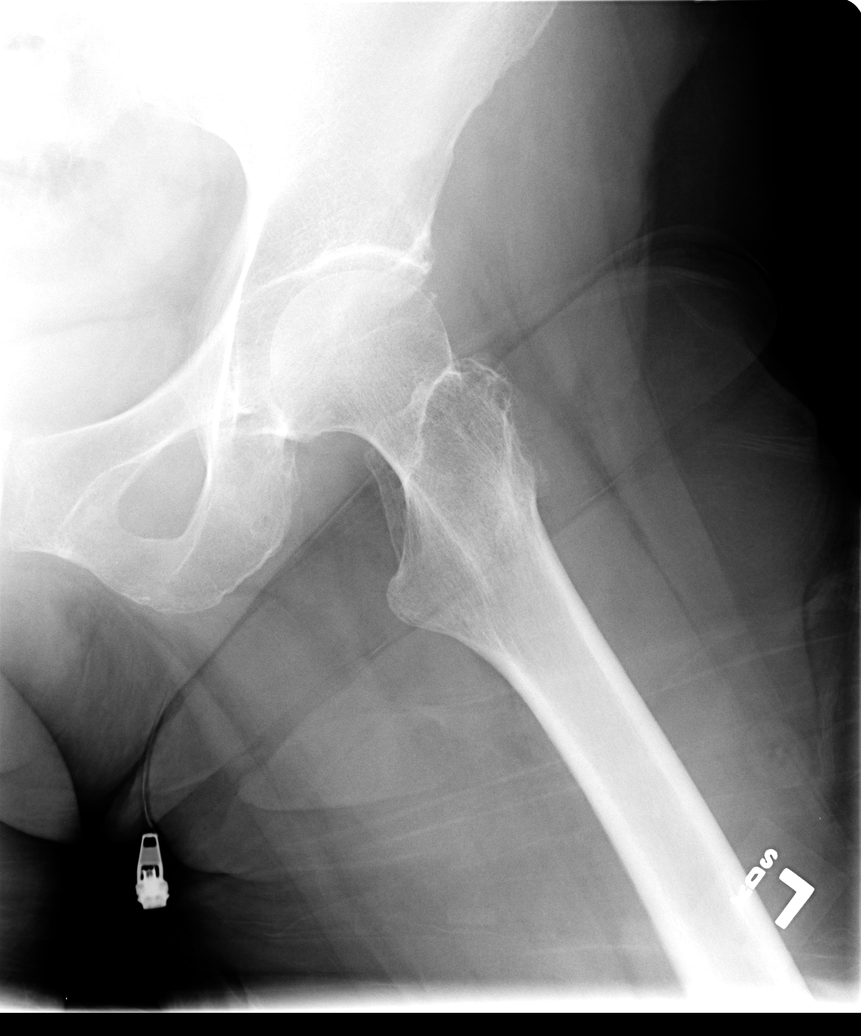

[view not recorded (4 of 5)]
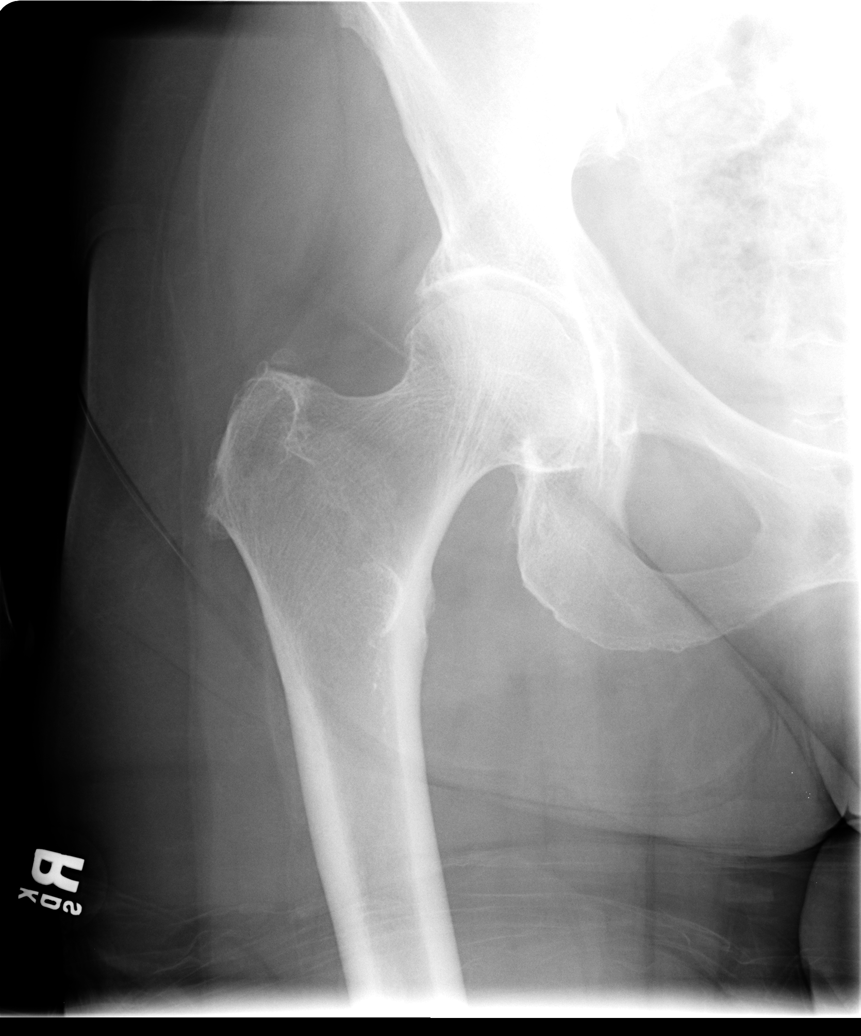

[view not recorded (5 of 5)]
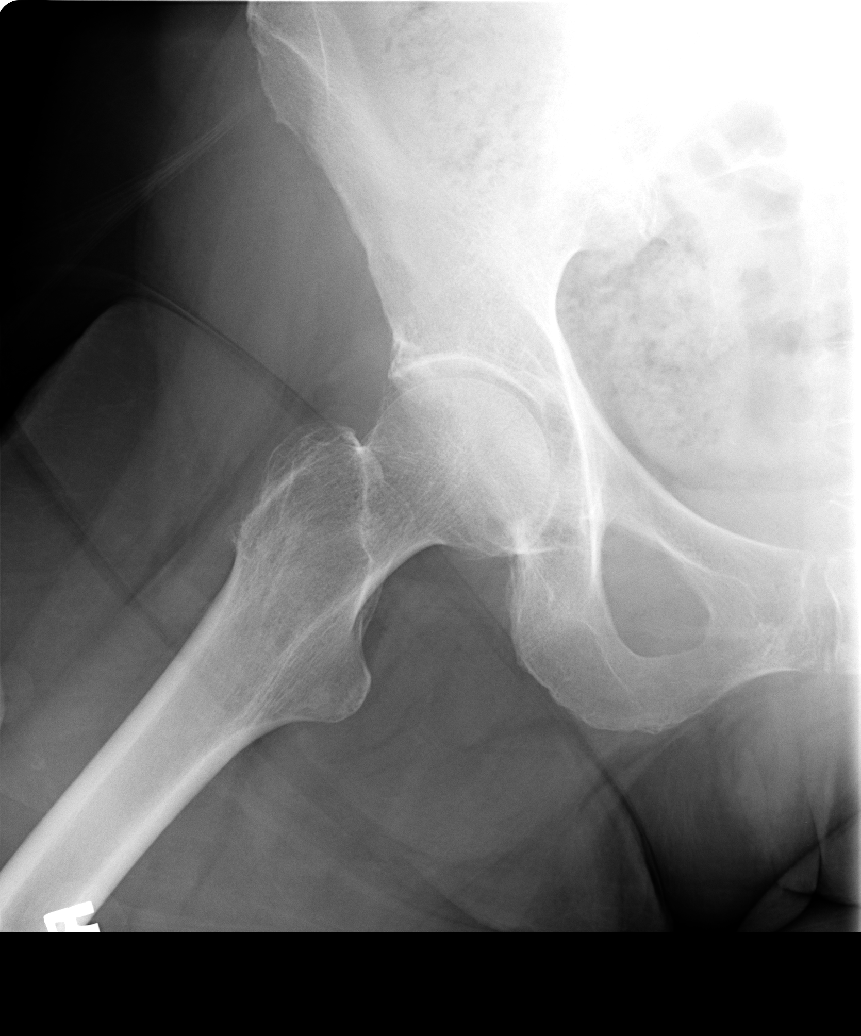

[5 of 5 positions shown; findings below may reference images not displayed]

FINDINGS: The hips are located and the pelvis is intact.  There is degenerative change noted in the lower lumbar spine.  Changes of osteoarthritis are seen in the hips bilaterally which appear mild to moderate.  No acute finding.
IMPRESSION: 1.  Mild to moderate bilateral hip osteoarthritis without acute finding.
 2.  Degenerative disease of the lumbar spine.

## 2006-06-05 IMAGING — CR DG CHEST 2V
2 series · 2 of 2 positions shown · non-contrast
Comparison: Chest x-ray of [DATE].

CLINICAL DATA: Hip, back, and chest pain.  No known injury. 
 CHEST - 2 VIEW ? [DATE]:

[view not recorded (1 of 2)]
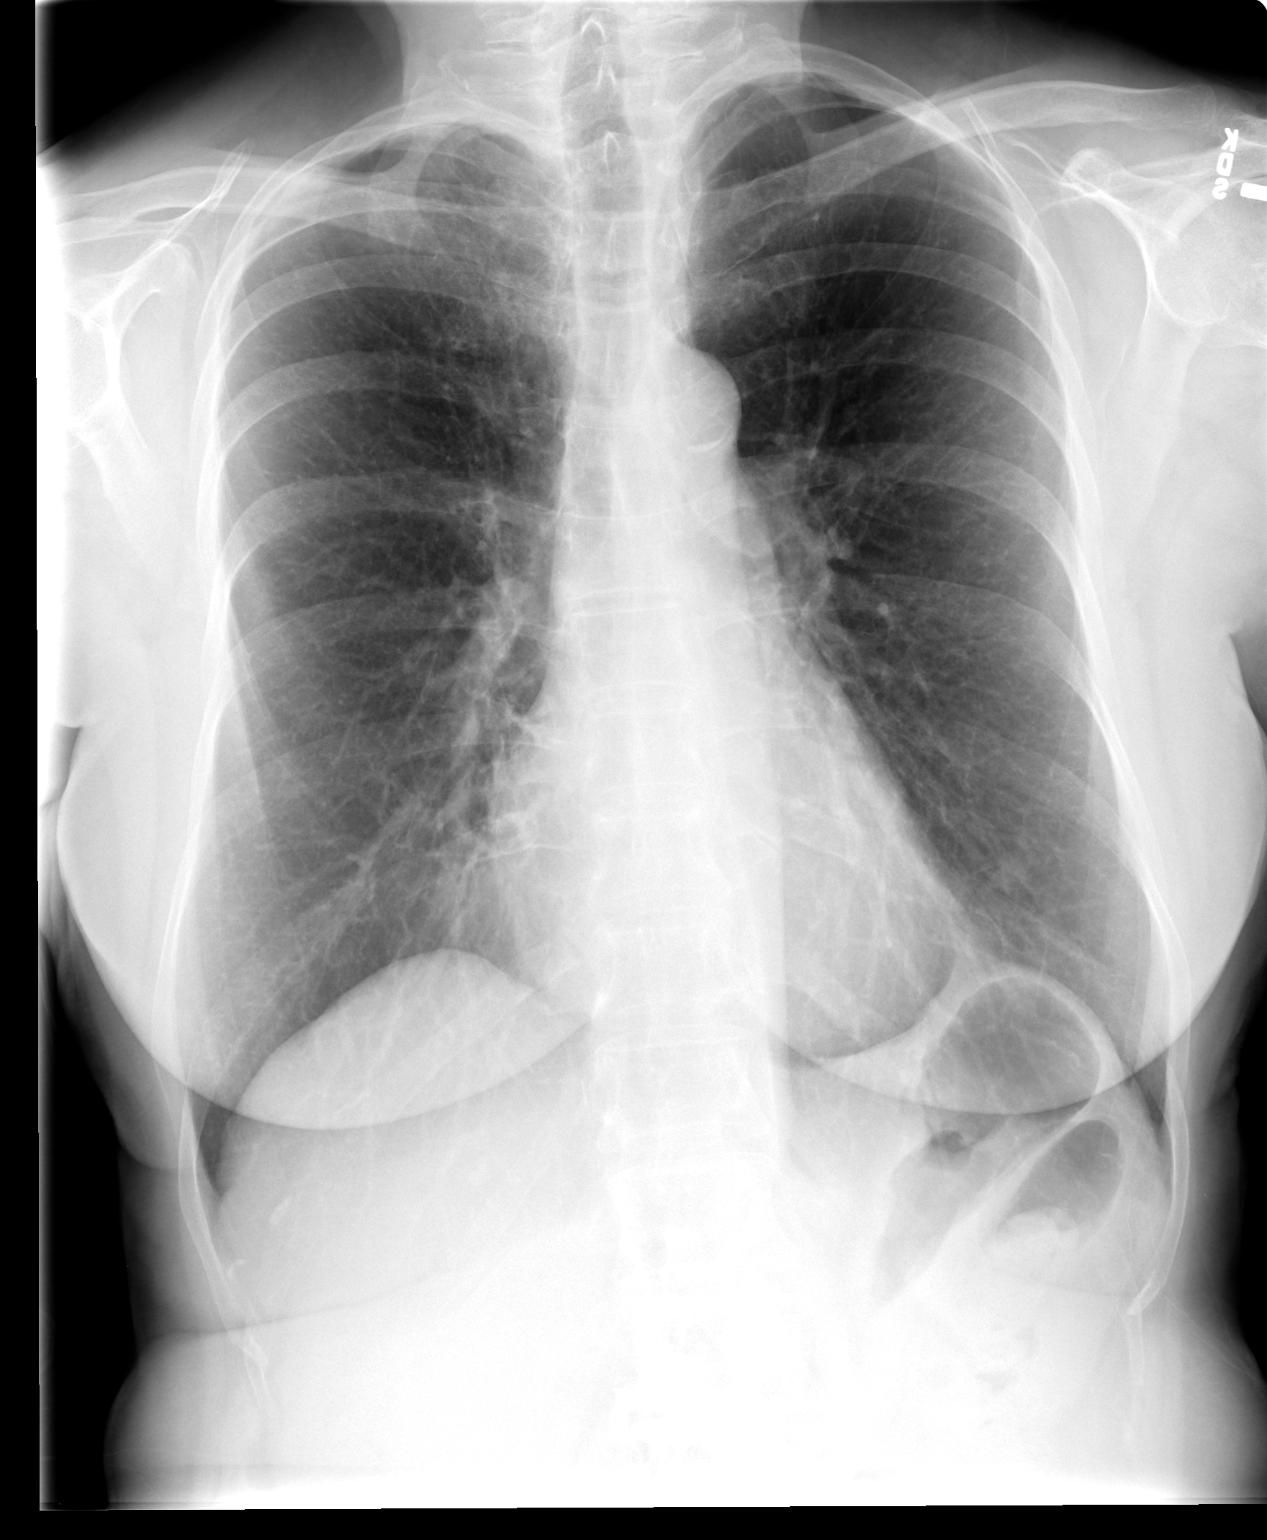

[view not recorded (2 of 2)]
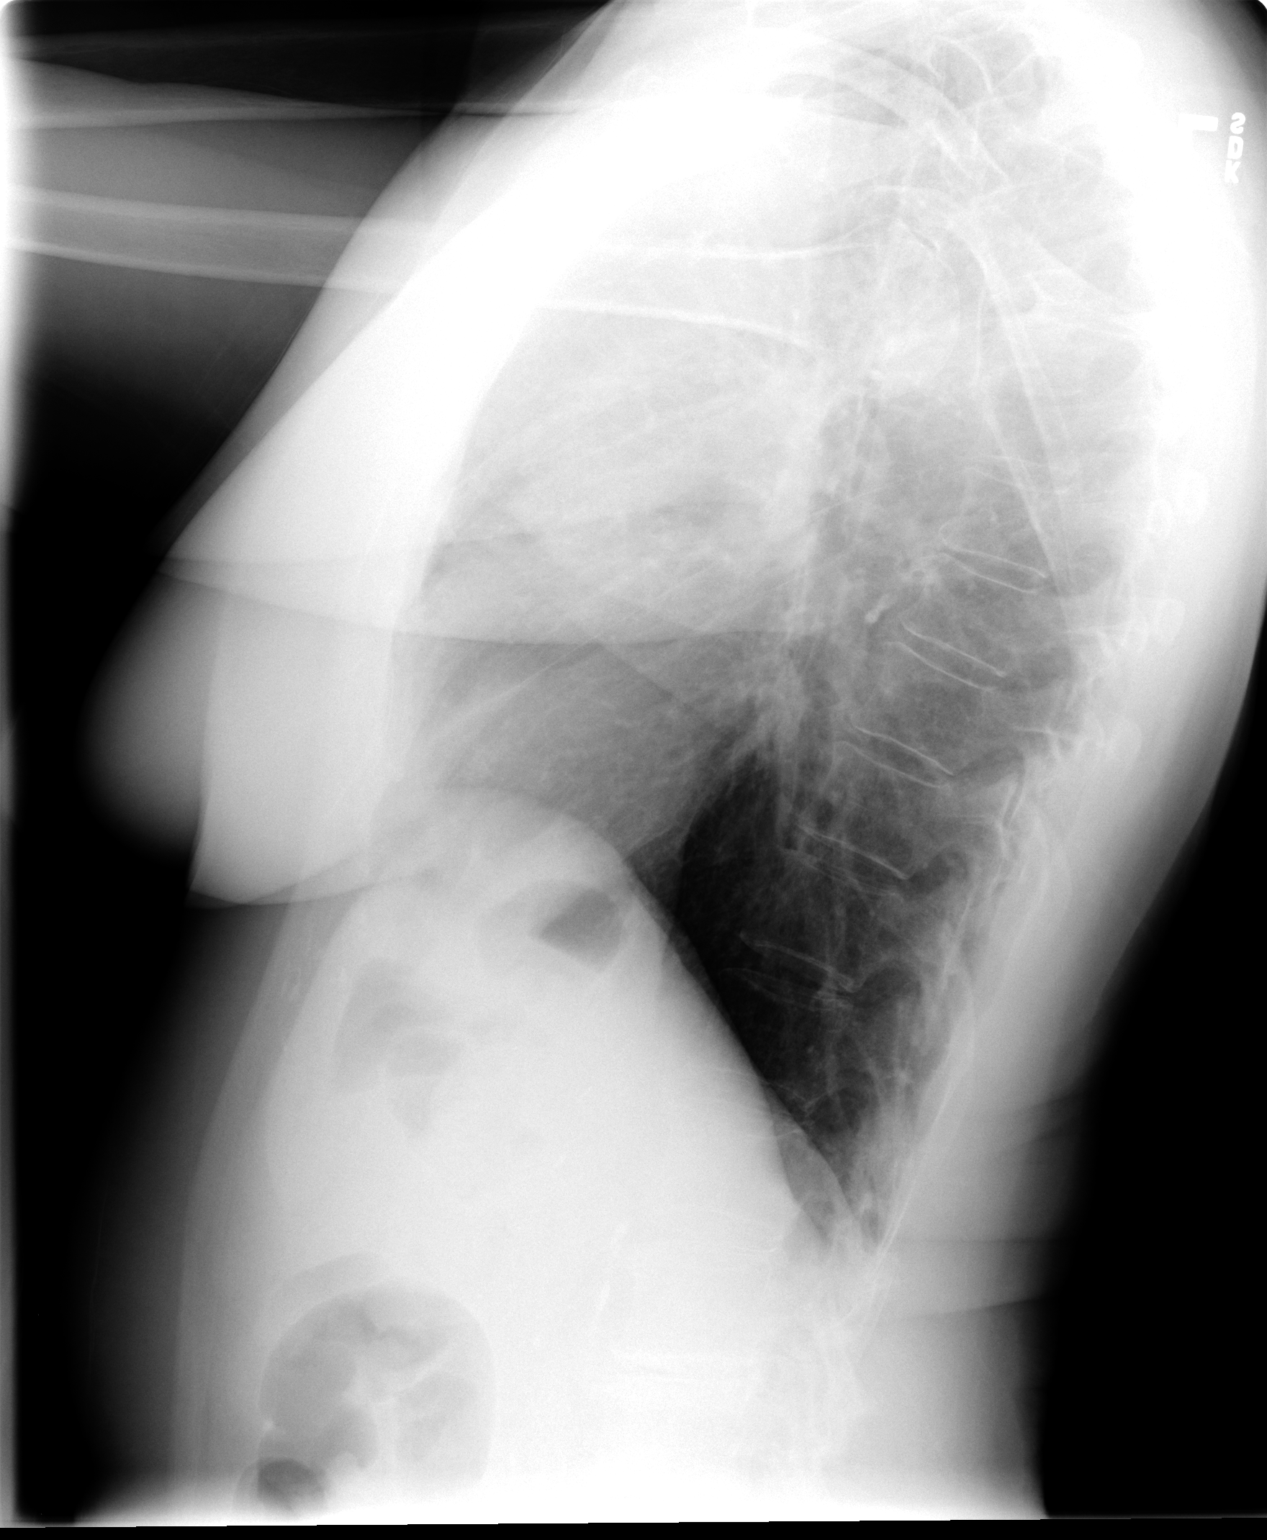

[2 of 2 positions shown; findings below may reference images not displayed]

FINDINGS: The chest is hyperexpanded.  There is a nodular opacity projecting over the left lower lung zone which may represent the patient?s nipple.  The lungs are otherwise clear.  No effusion.  The heart and mediastinum are unremarkable.
IMPRESSION: 1.  Nodular opacity in the left lower lung zone which may represent the patient?s nipple.  Recommend repeat films with nipple markers in place.  
 2.  Emphysema without acute disease .

## 2006-06-05 IMAGING — CR DG LUMBAR SPINE COMPLETE 4+V
5 series · 5 of 5 positions shown · non-contrast
Comparison: none

CLINICAL DATA: Hip, back, and chest pain.  No known injury. 
 LUMBAR SPINE ? 4 VIEWS ? [DATE]:

[view not recorded (1 of 5)]
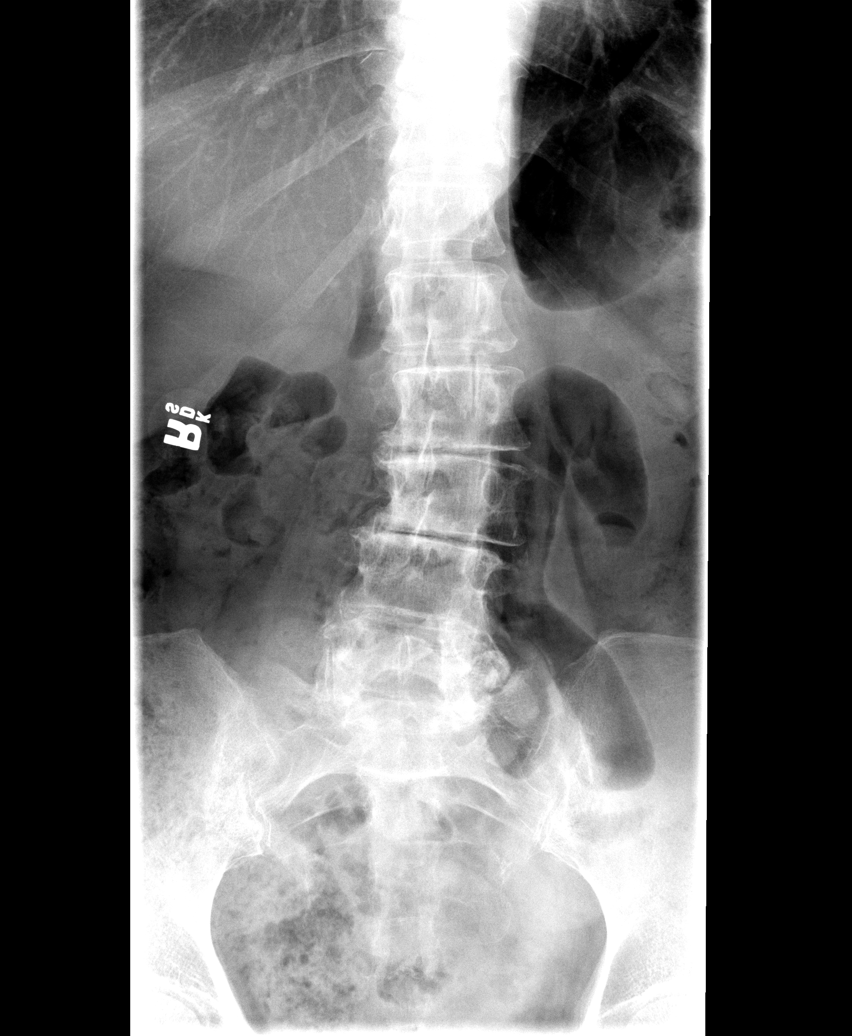

[view not recorded (2 of 5)]
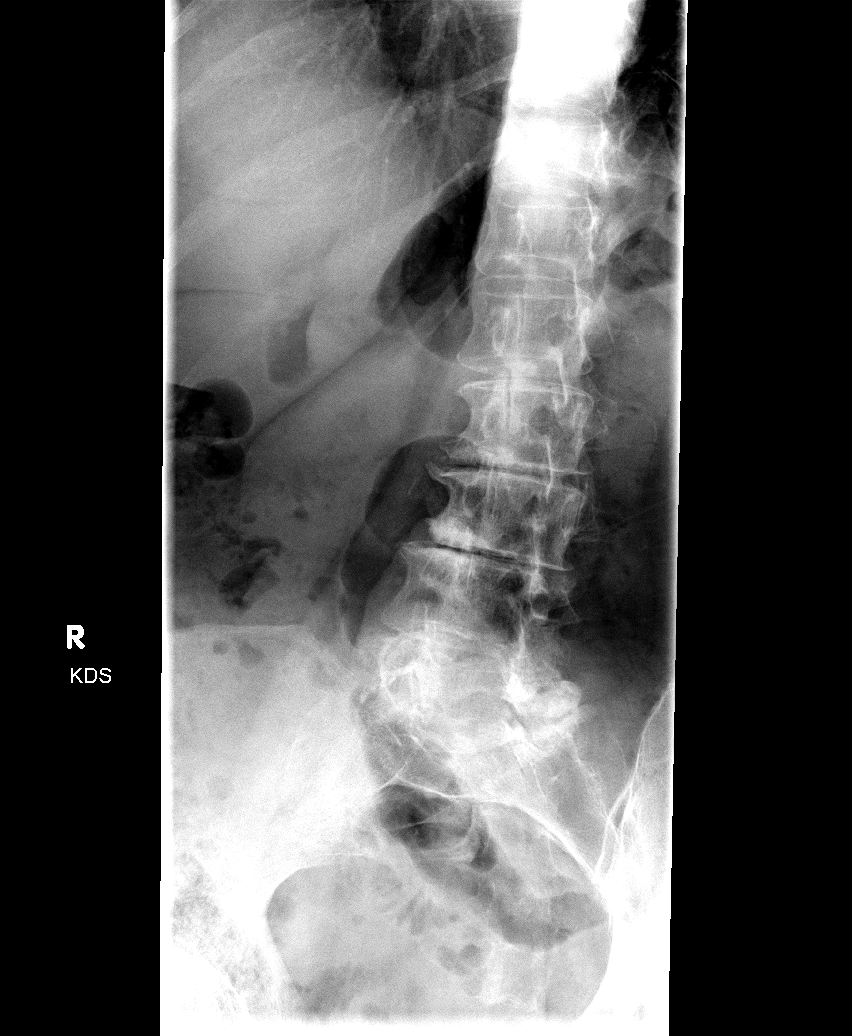

[view not recorded (3 of 5)]
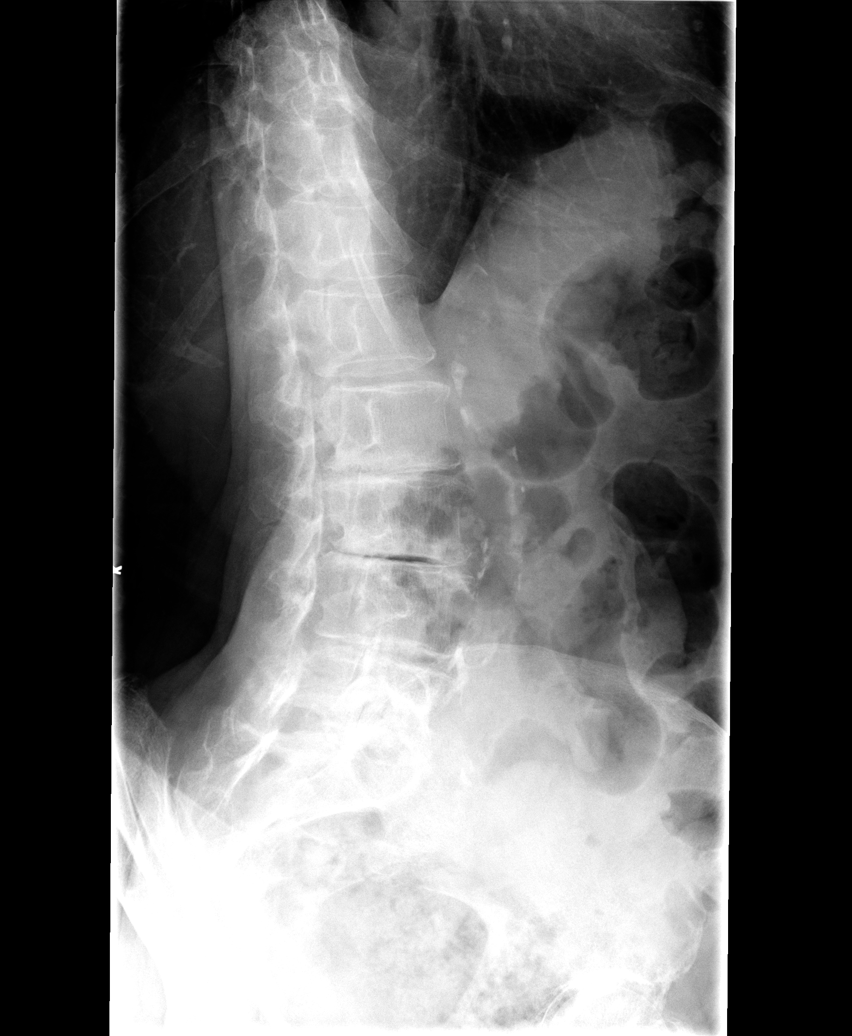

[view not recorded (4 of 5)]
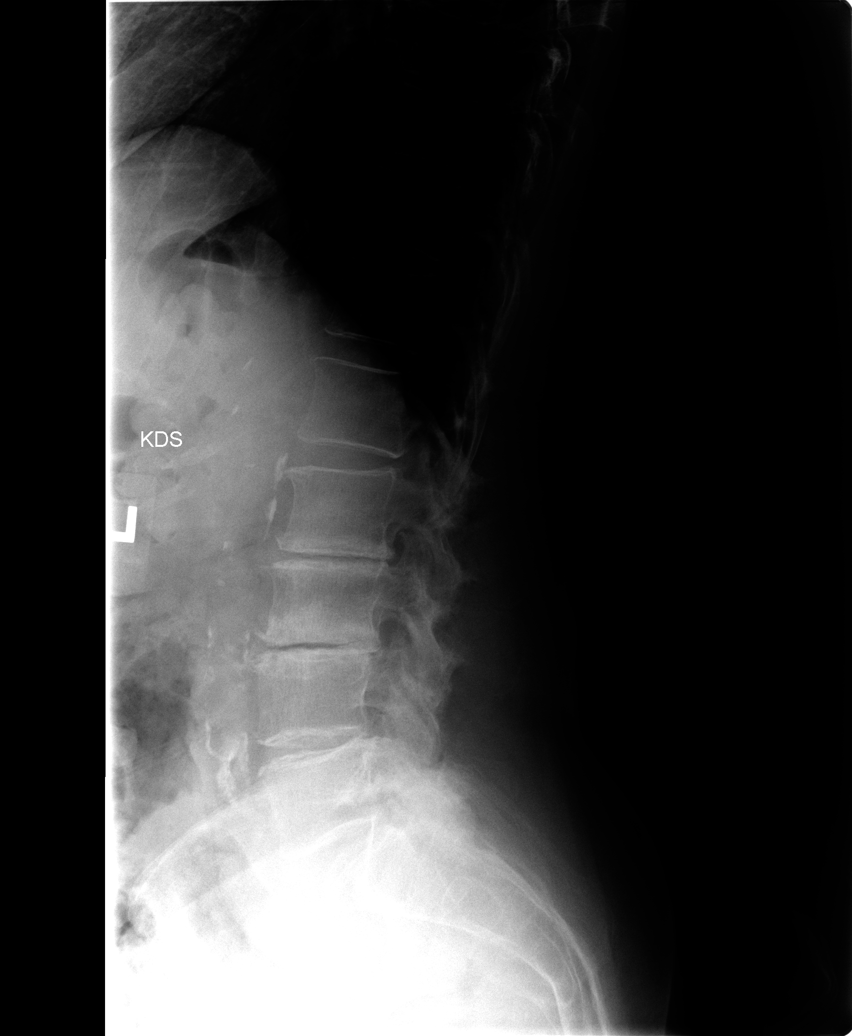

[view not recorded (5 of 5)]
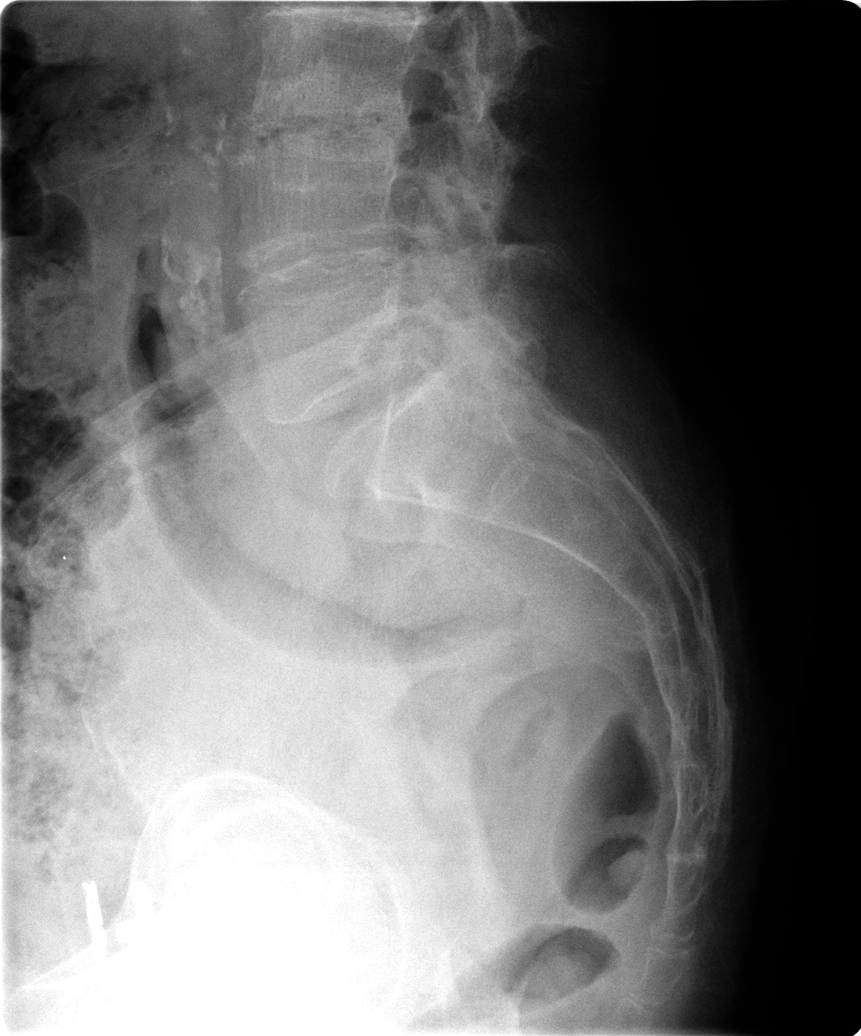

[5 of 5 positions shown; findings below may reference images not displayed]

FINDINGS: There is convex left scoliosis of the lumbar spine with the apex at L3.  The patient has severe degenerative disk disease with marked loss of disk space height and end-plate sclerosis at L2-3, L3-4, and L4-5.  Severe facet arthropathy is identified from L3-4 to L5-S1 with the L5-S1 level most notably affected.  No fracture. Atherosclerosis is noted.
IMPRESSION: Severe degenerative change of the lumbar spine from L3-4 to L5-S1 involving both the intervertebral disks and the facet joints as above.

## 2006-06-11 ENCOUNTER — Ambulatory Visit (HOSPITAL_COMMUNITY): Admission: RE | Admit: 2006-06-11 | Discharge: 2006-06-11 | Payer: Self-pay | Admitting: Internal Medicine

## 2006-06-11 IMAGING — CR DG CHEST SPECIAL VIEW
1 series · 1 of 1 positions shown · non-contrast
Comparison: [DATE].

CLINICAL DATA: Chest pain, nipple markers.
 CHEST ? 1 VIEW:

[view not recorded]
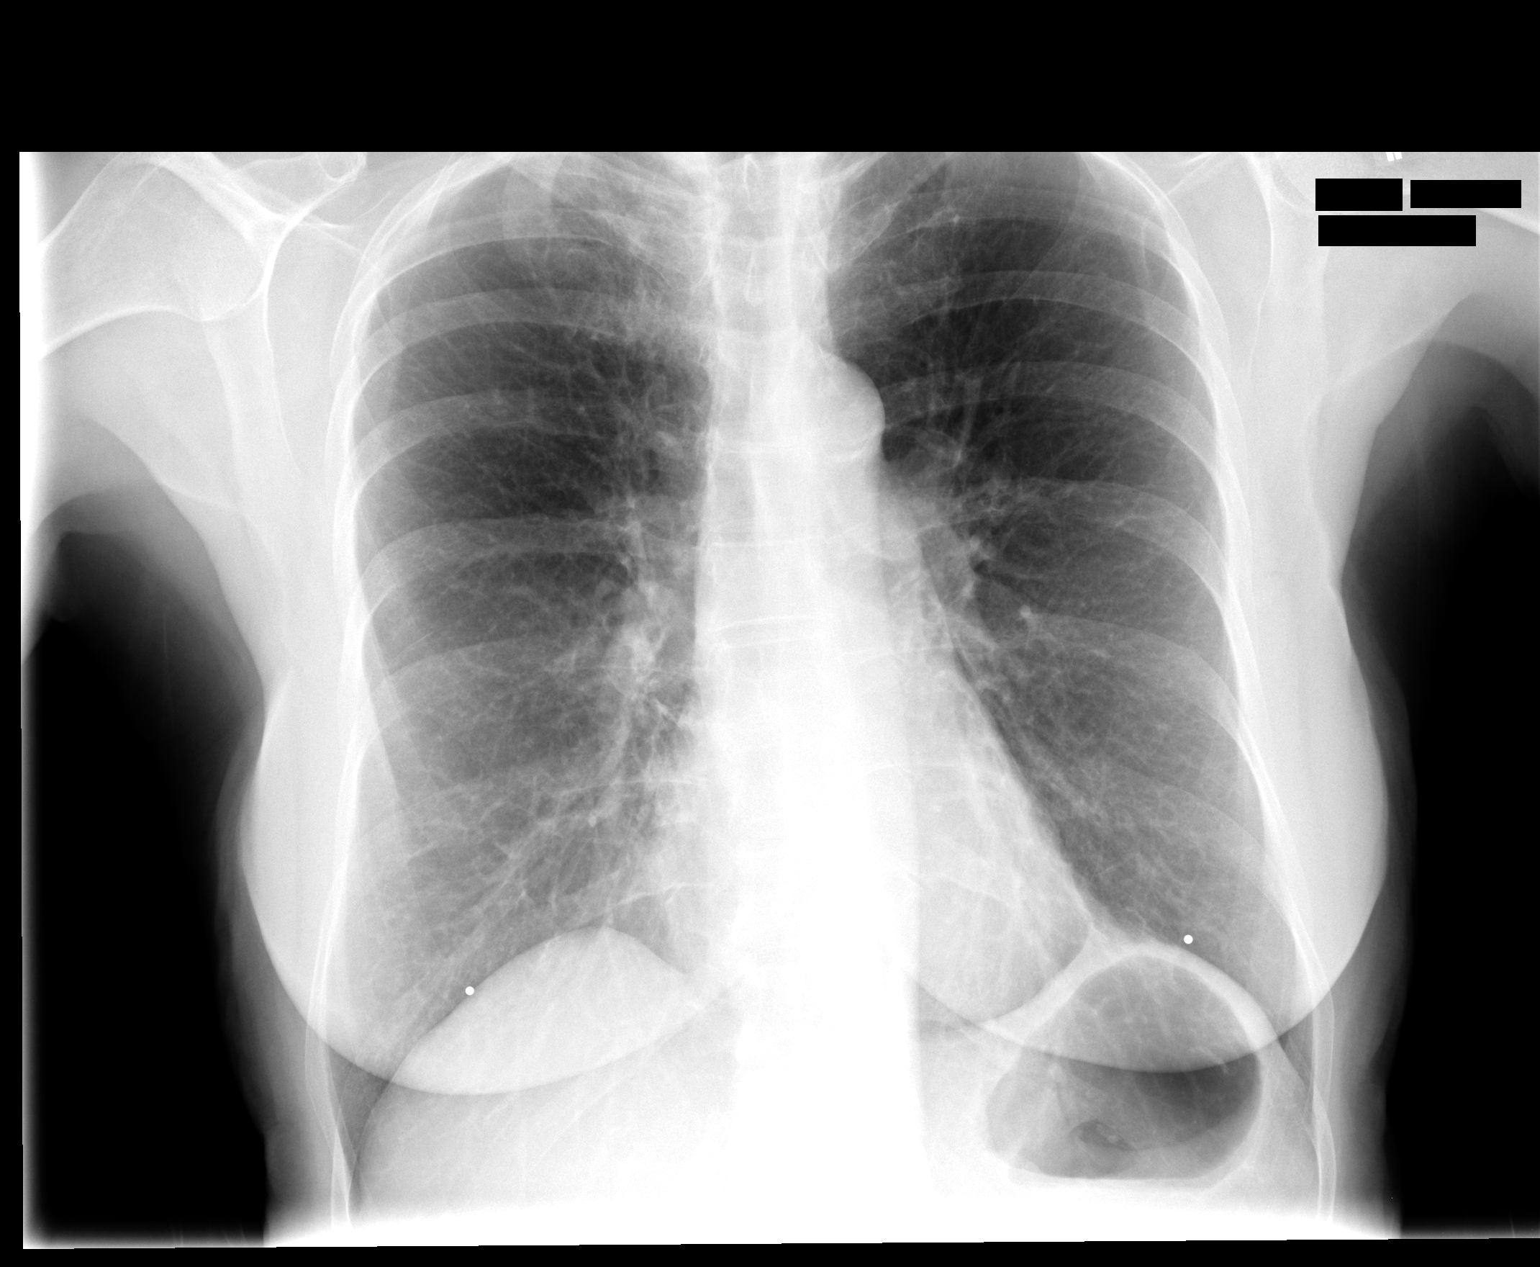

[1 of 1 positions shown; findings below may reference images not displayed]

FINDINGS: Previously seen nodular opacity in the left lower lung zone likely represented a confluence of shadows, as it is not seen today.  Right apical scarring.  Lungs otherwise clear.
IMPRESSION: No acute findings.

## 2006-08-27 ENCOUNTER — Encounter: Admission: RE | Admit: 2006-08-27 | Discharge: 2006-08-27 | Payer: Self-pay | Admitting: Internal Medicine

## 2006-08-27 IMAGING — MG MM DIAGNOSTIC UNILATERAL L
4 series · 4 of 4 positions shown · non-contrast
Comparison: none

DG DIAGNOSTIC UNILATERAL L
CC and MLO view(s) were taken of the left breast.

DIGITAL LEFT DIAGNOSTIC MAMMOGRAM WITH CAD:
CLINICAL DATA: The patient returns for six month follow-up of a suspected benign asymmetric 
density in the medial aspect of the left breast.

[L CC]
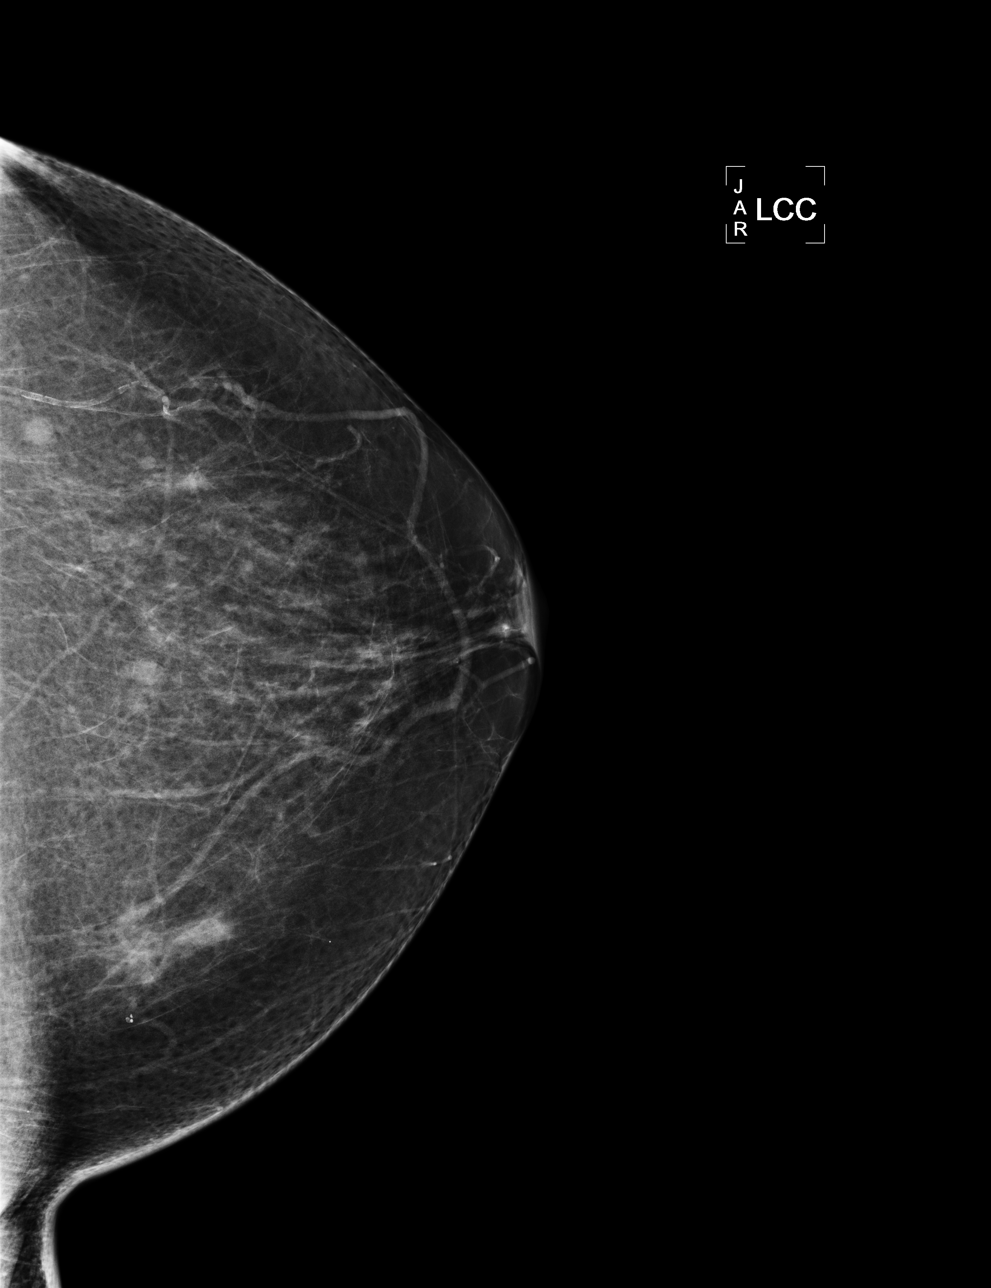

[L MLO (1 of 3)]
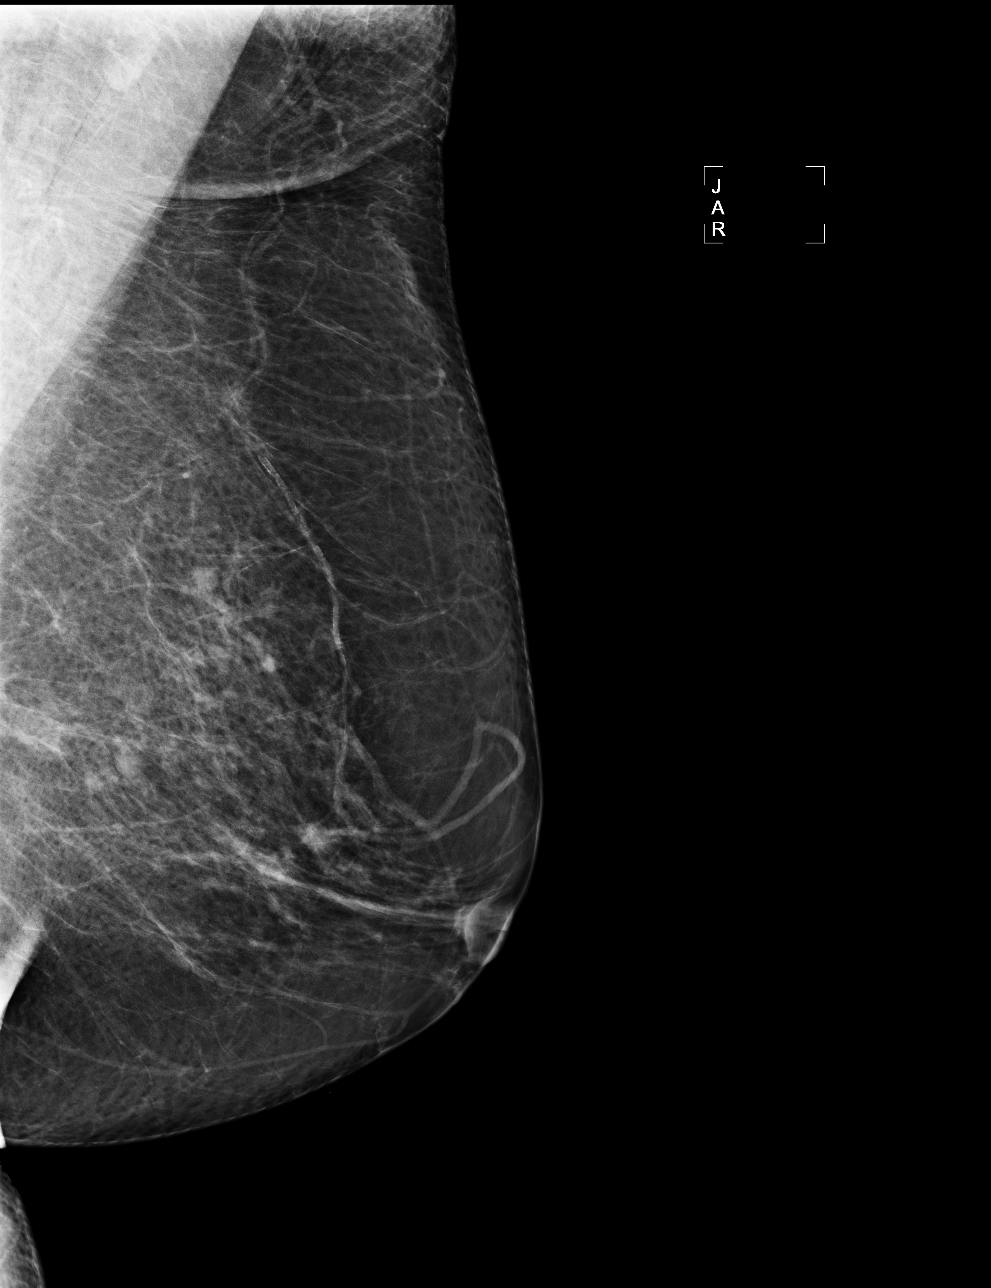

[L MLO (2 of 3)]
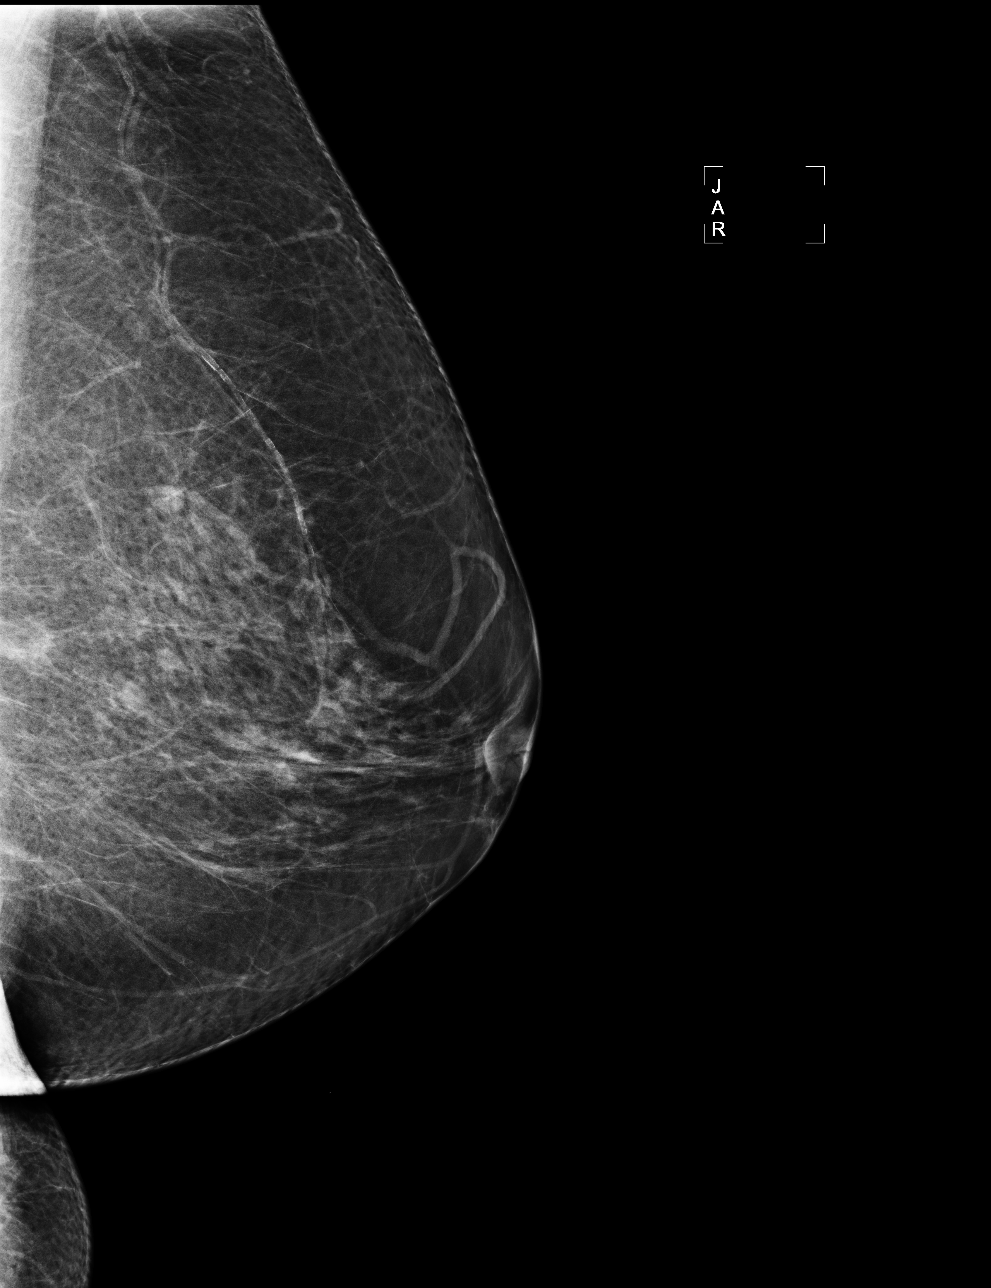

[L MLO (3 of 3)]
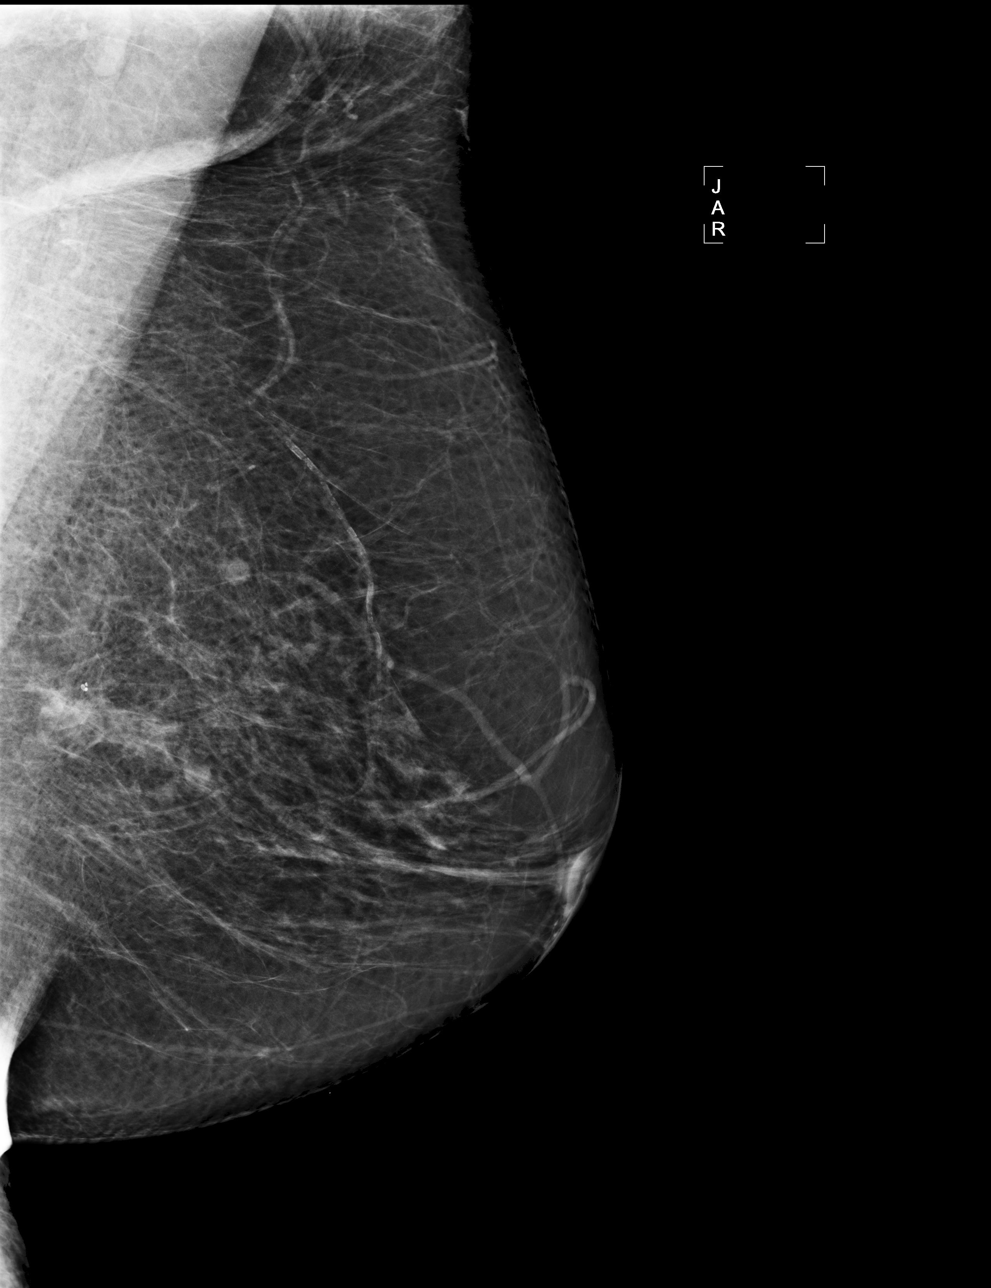

[4 of 4 positions shown; findings below may reference images not displayed]

Comparison is made to the prior study of [DATE] and to our additional views dated [DATE].

There are scattered fibroglandular densities.  There is no dominant mass, architectural distortion,
or calcification to suggest malignancy.  The asymmetric density in the medial aspect of the left 
breast is stable and thought to be of benign etiology.
IMPRESSION: Stable asymmetric density in the medial aspect of the left breast.  Suggest bilateral diagnostic 
mammography in [DATE].

ASSESSMENT: Probably benign - BI-RADS 3

ANALYZED BY COMPUTER AIDED DETECTION. ,

## 2007-02-23 ENCOUNTER — Ambulatory Visit: Payer: Self-pay | Admitting: Internal Medicine

## 2007-02-27 ENCOUNTER — Encounter: Admission: RE | Admit: 2007-02-27 | Discharge: 2007-02-27 | Payer: Self-pay | Admitting: Internal Medicine

## 2007-02-27 IMAGING — MG MM DIAGNOSTIC BILATERAL
4 series · 4 of 4 positions shown · non-contrast
Comparison: none

DG DIAGNOSTIC BILATERAL
Bilateral CC and MLO view(s) were taken.

DIGITAL BILATERAL DIAGNOSTIC MAMMOGRAM WITH CAD:
CLINICAL DATA: Short term interval follow-up of the left breast.
There are scattered fibroglandular densities.  There is no suspicious mass or malignant-type 
microcalcification in either breast.
Comparison is made to  prior mammograms dated [DATE], [DATE] and [DATE].

[R CC]
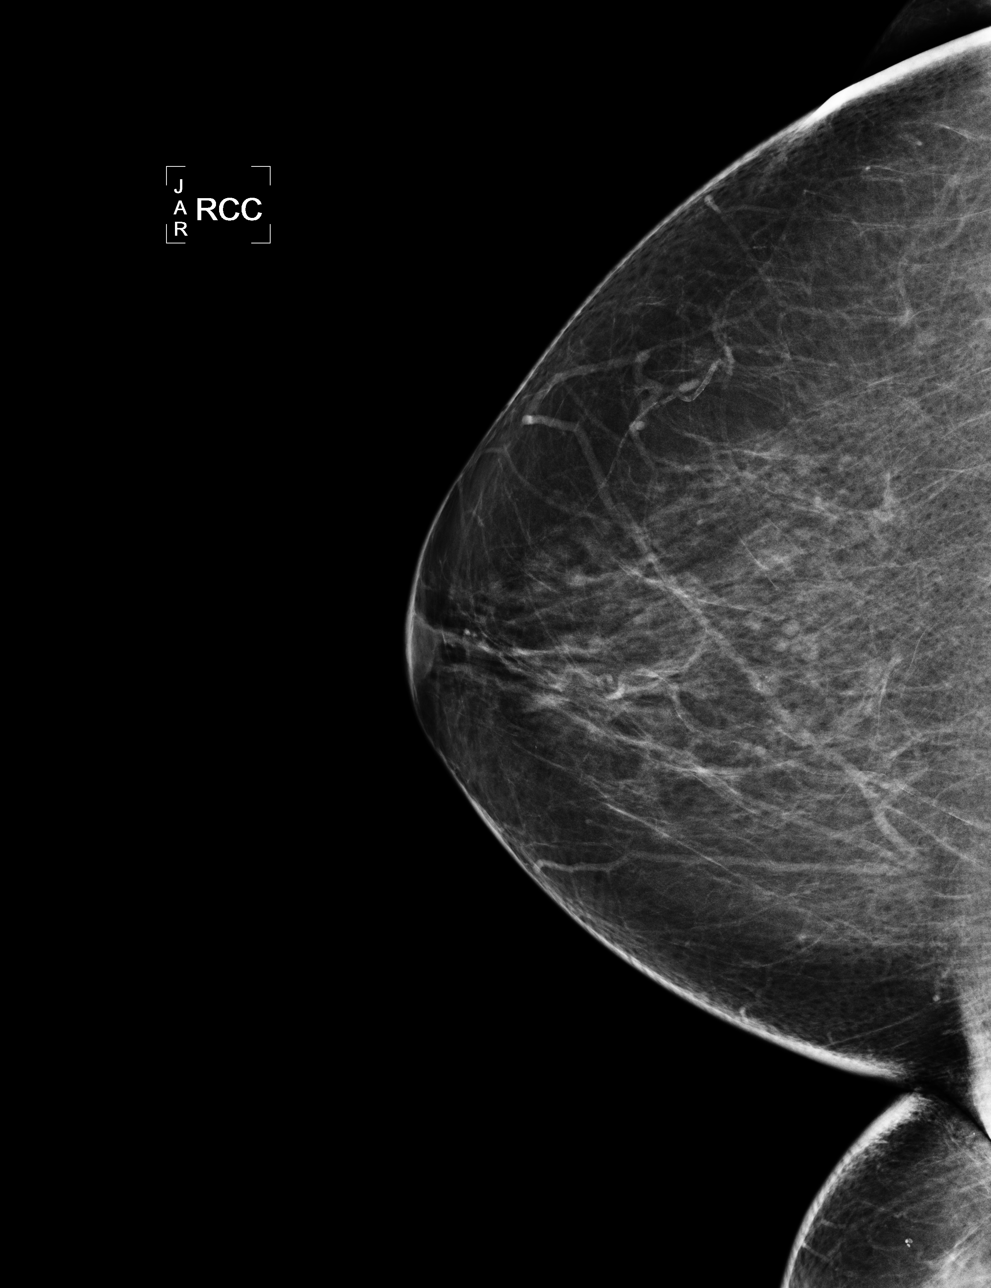

[L CC]
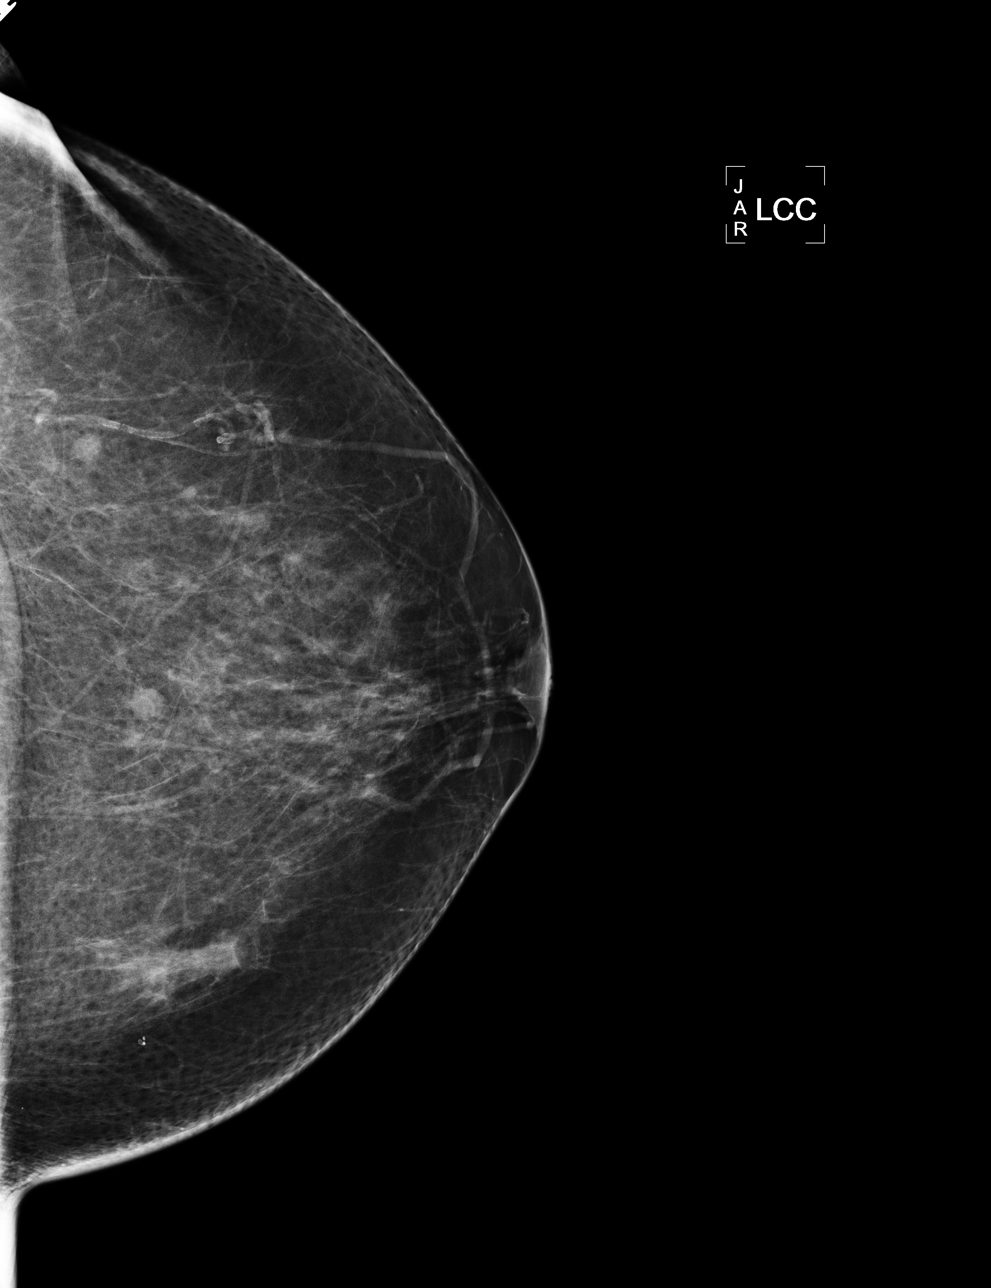

[L MLO]
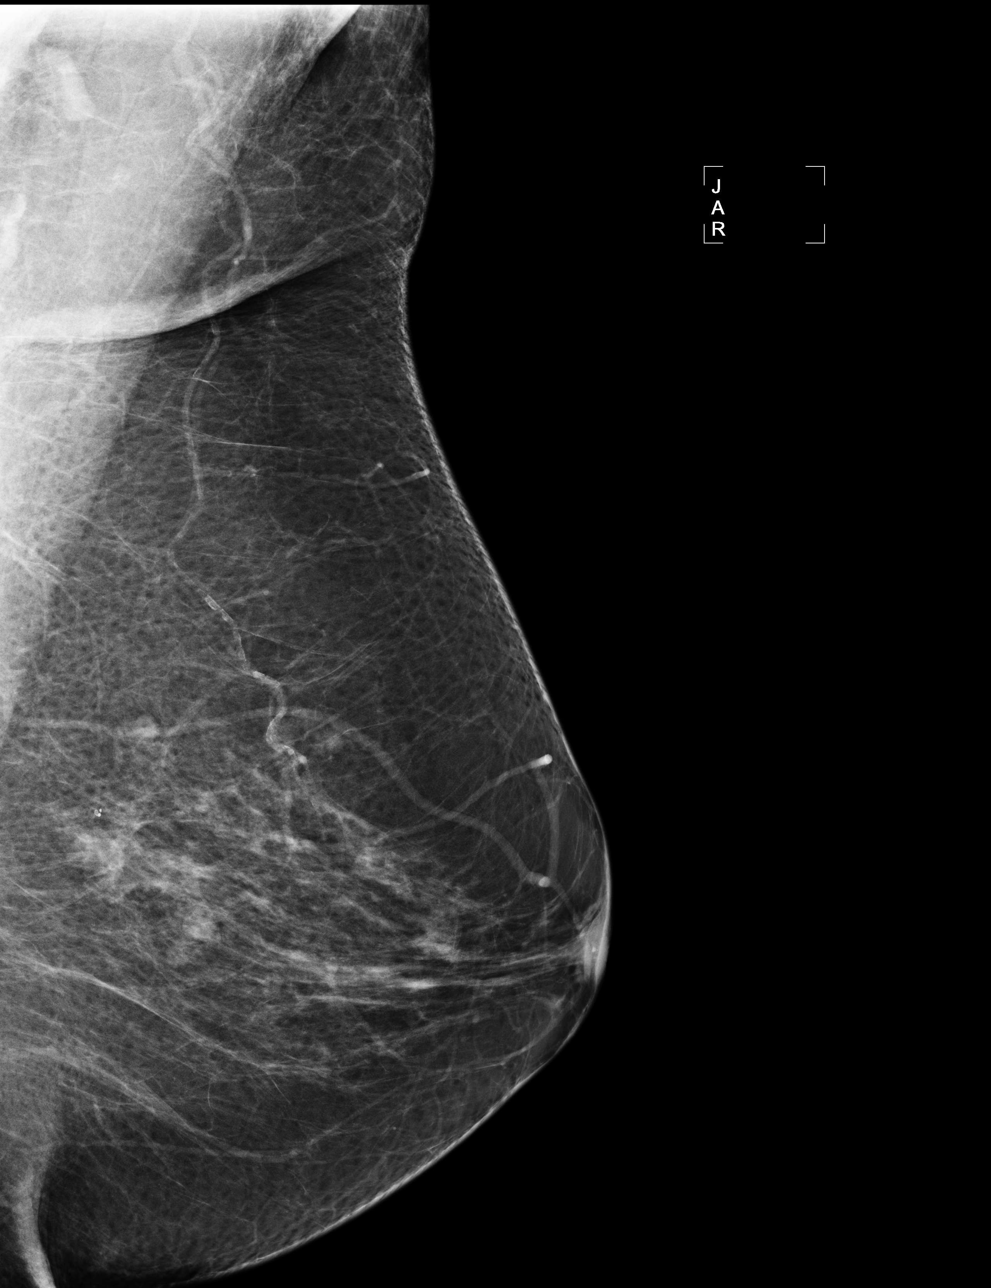

[R MLO]
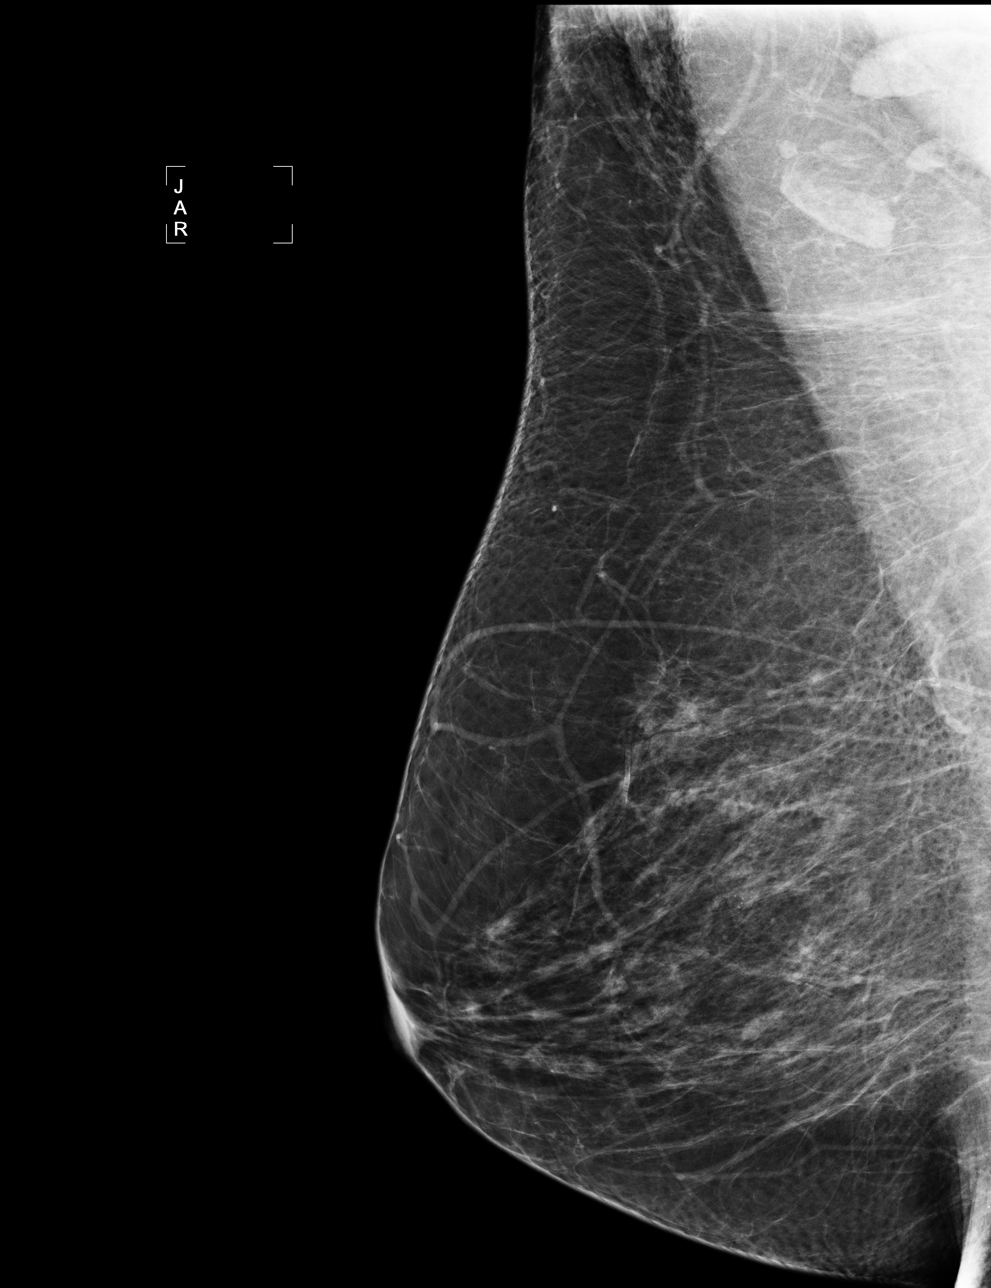

[4 of 4 positions shown; findings below may reference images not displayed]

IMPRESSION: No evidence of malignancy in either breast.  Screening mammogram in a year is recommended.

ASSESSMENT: Benign - BI-RADS 2

Screening mammogram of both breasts in 1 year.
, THIS PROCEDURE WAS A DIGITAL MAMMOGRAM

## 2007-04-16 ENCOUNTER — Ambulatory Visit: Payer: Self-pay | Admitting: Internal Medicine

## 2007-07-21 ENCOUNTER — Ambulatory Visit (HOSPITAL_COMMUNITY): Admission: RE | Admit: 2007-07-21 | Discharge: 2007-07-21 | Payer: Self-pay | Admitting: Internal Medicine

## 2007-07-21 IMAGING — CR DG CHEST 2V
2 series · 2 of 2 positions shown · non-contrast
Comparison: [DATE]

CLINICAL DATA: Chest pain and sob.

CHEST - 2 VIEW

[view not recorded (1 of 2)]
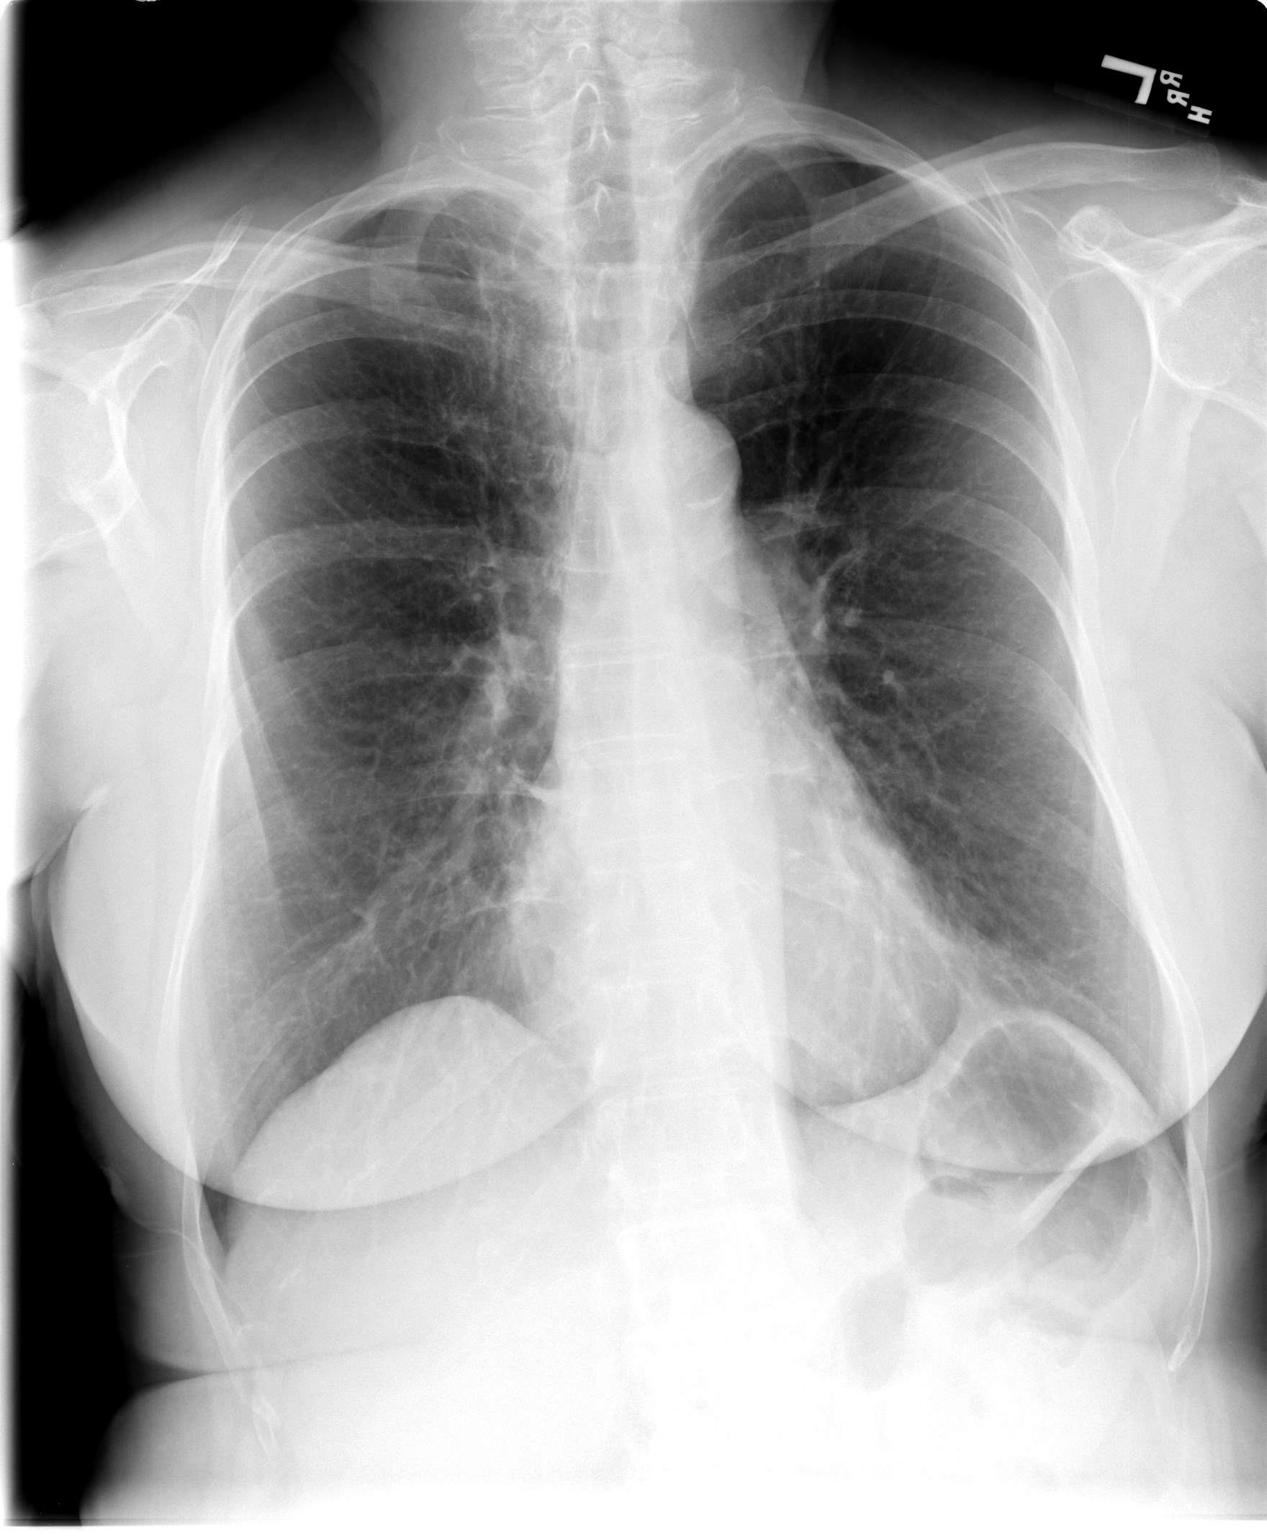

[view not recorded (2 of 2)]
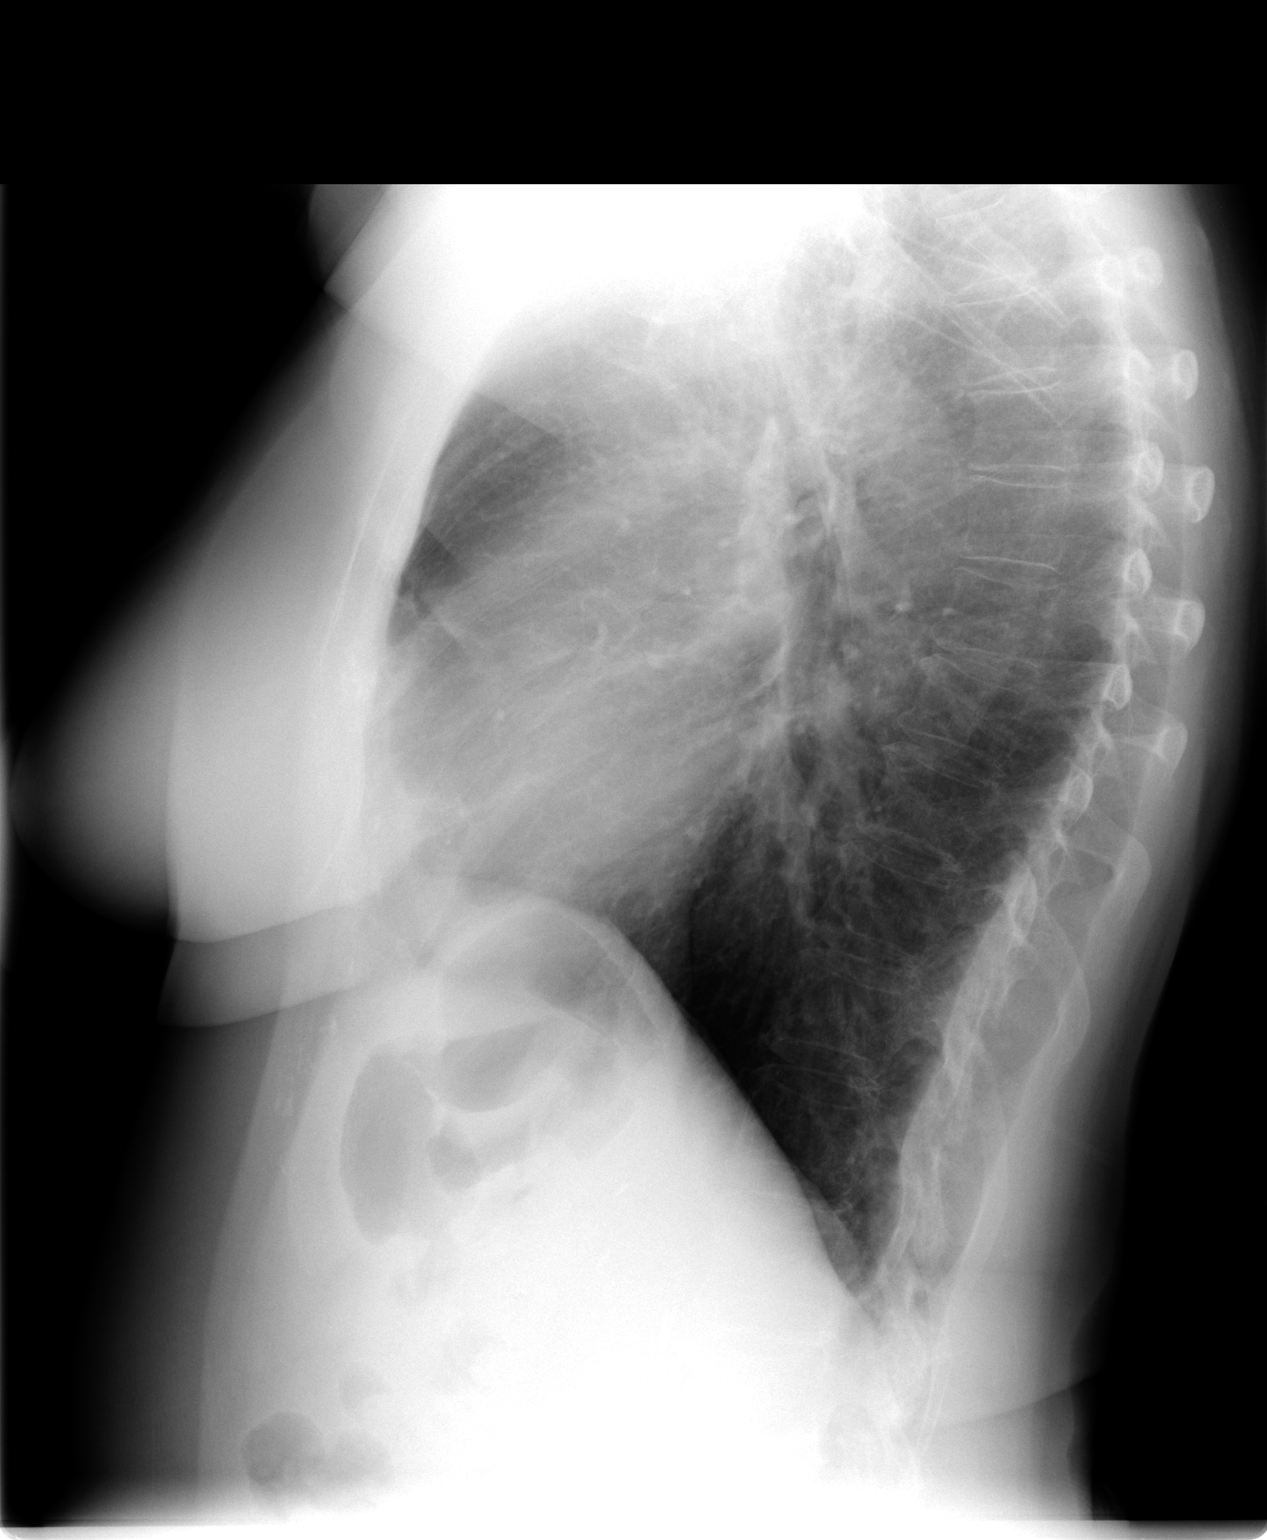

[2 of 2 positions shown; findings below may reference images not displayed]

FINDINGS: COPD. Peribronchial thickening.

Midline trachea. Normal heart size. Atherosclerotic transverse aorta. Sharp
costophrenic angles. No pneumothorax. Similar medial right apical opacity is
likely scarring. Volume loss and left hemidiaphragm tenting is also consistent
with scarring. Stable.

IMPRESSION

1. COPD and multifocal scarring without acute disease.

## 2007-07-29 ENCOUNTER — Ambulatory Visit (HOSPITAL_COMMUNITY): Admission: RE | Admit: 2007-07-29 | Discharge: 2007-07-29 | Payer: Self-pay | Admitting: Internal Medicine

## 2007-08-03 ENCOUNTER — Ambulatory Visit (HOSPITAL_COMMUNITY): Admission: RE | Admit: 2007-08-03 | Discharge: 2007-08-03 | Payer: Self-pay | Admitting: Internal Medicine

## 2007-12-20 ENCOUNTER — Emergency Department (HOSPITAL_COMMUNITY): Admission: EM | Admit: 2007-12-20 | Discharge: 2007-12-20 | Payer: Self-pay | Admitting: Emergency Medicine

## 2007-12-21 ENCOUNTER — Emergency Department (HOSPITAL_COMMUNITY): Admission: EM | Admit: 2007-12-21 | Discharge: 2007-12-21 | Payer: Self-pay | Admitting: Emergency Medicine

## 2008-01-24 DIAGNOSIS — Z9889 Other specified postprocedural states: Secondary | ICD-10-CM

## 2008-01-24 HISTORY — DX: Other specified postprocedural states: Z98.890

## 2008-02-02 ENCOUNTER — Ambulatory Visit: Payer: Self-pay | Admitting: Internal Medicine

## 2008-02-09 ENCOUNTER — Ambulatory Visit: Payer: Self-pay | Admitting: Internal Medicine

## 2008-02-09 ENCOUNTER — Ambulatory Visit (HOSPITAL_COMMUNITY): Admission: RE | Admit: 2008-02-09 | Discharge: 2008-02-09 | Payer: Self-pay | Admitting: Internal Medicine

## 2008-02-09 HISTORY — PX: ESOPHAGOGASTRODUODENOSCOPY: SHX1529

## 2008-06-01 ENCOUNTER — Ambulatory Visit: Payer: Self-pay | Admitting: Gastroenterology

## 2008-07-18 ENCOUNTER — Ambulatory Visit (HOSPITAL_COMMUNITY): Admission: RE | Admit: 2008-07-18 | Discharge: 2008-07-18 | Payer: Self-pay | Admitting: Internal Medicine

## 2008-07-18 IMAGING — US US RENAL
1 series · 14 of 25 positions shown · non-contrast
Comparison: None

CLINICAL DATA: Recurrent UTI, nephritis

RENAL/URINARY TRACT ULTRASOUND
TECHNIQUE: Complete ultrasound examination of the urinary tract
was performed including evaluation of the kidneys, renal collecting
systems and urinary bladder.

[Series 1: us renal · 0.30mm/px · 14 of 34 slices shown]
[im 1/34]
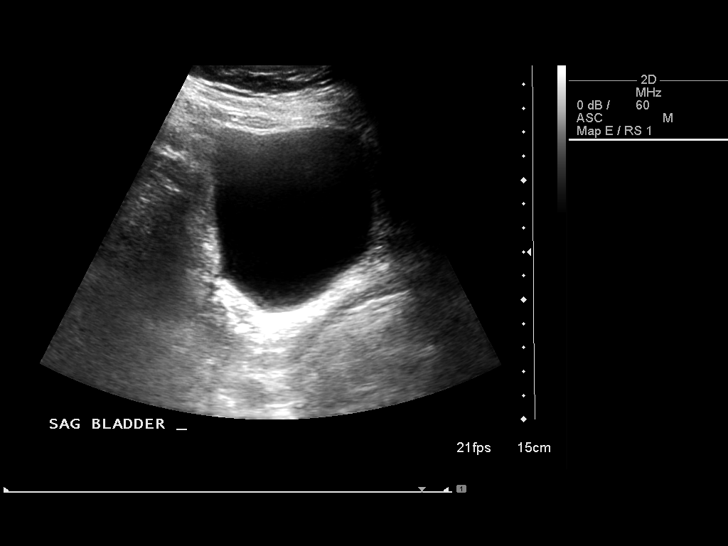
[im 3/34]
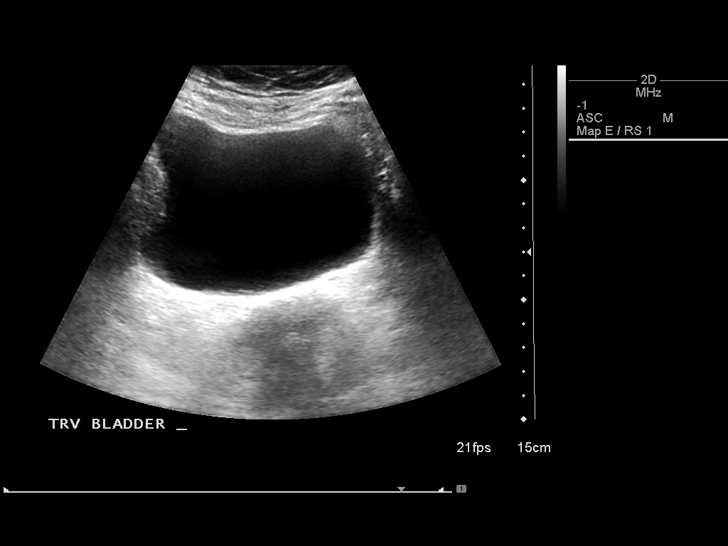
[im 6/34]
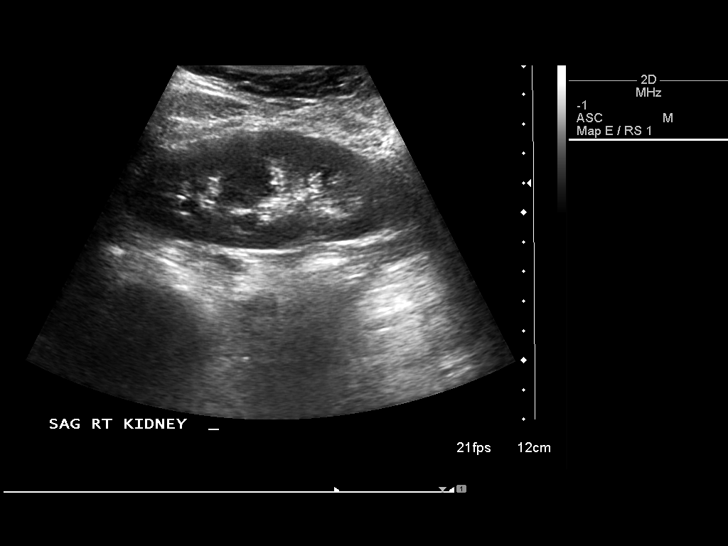
[im 9/34]
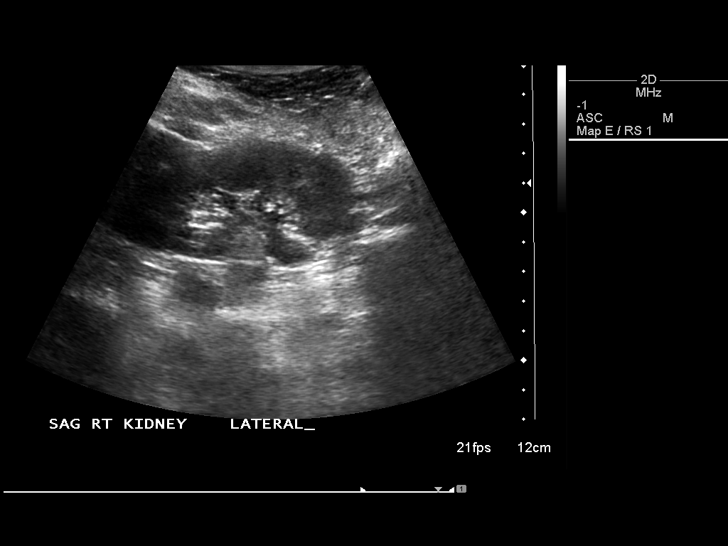
[im 12/34]
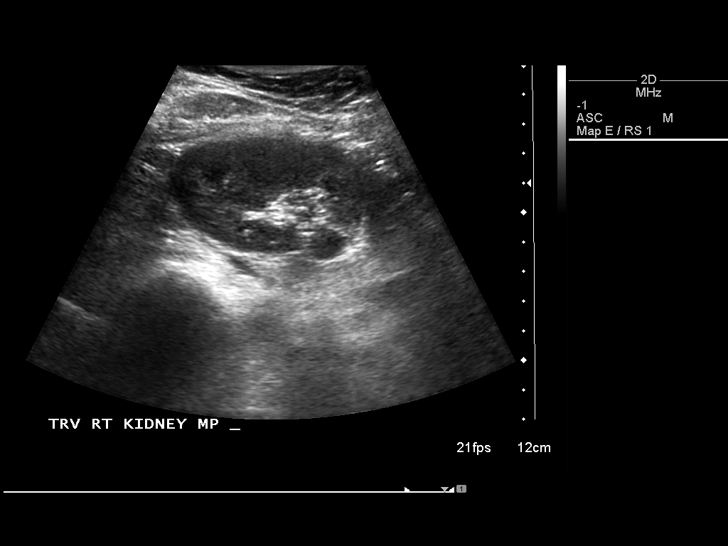
[im 13/34]
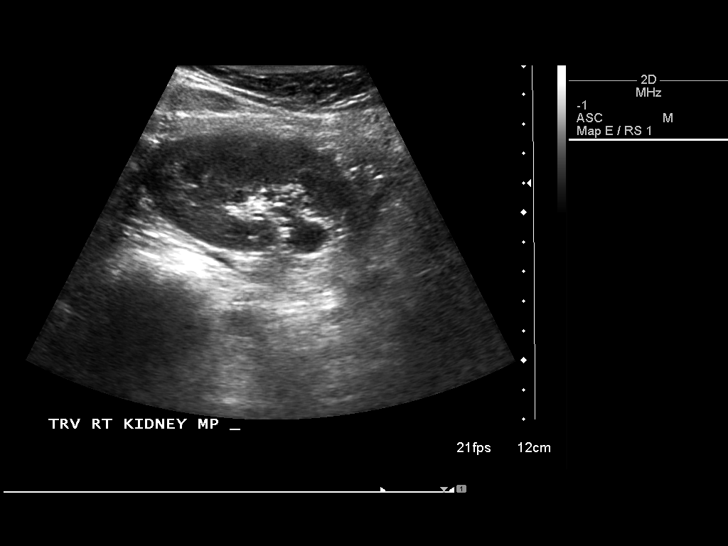
[im 16/34]
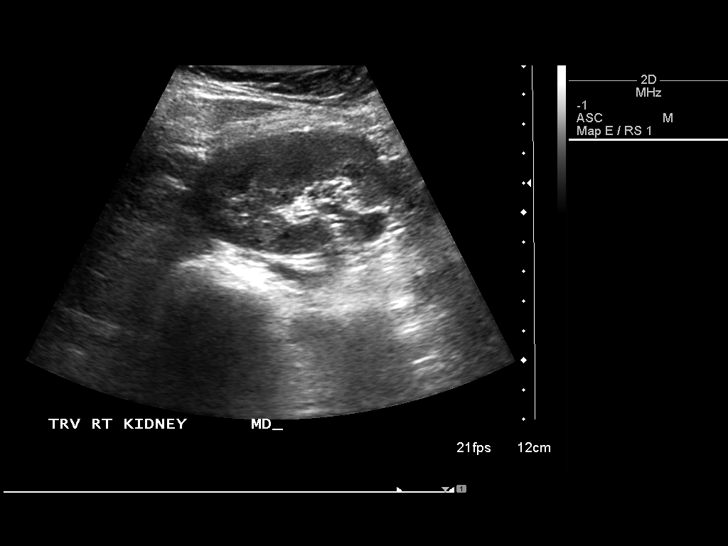
[im 18/34]
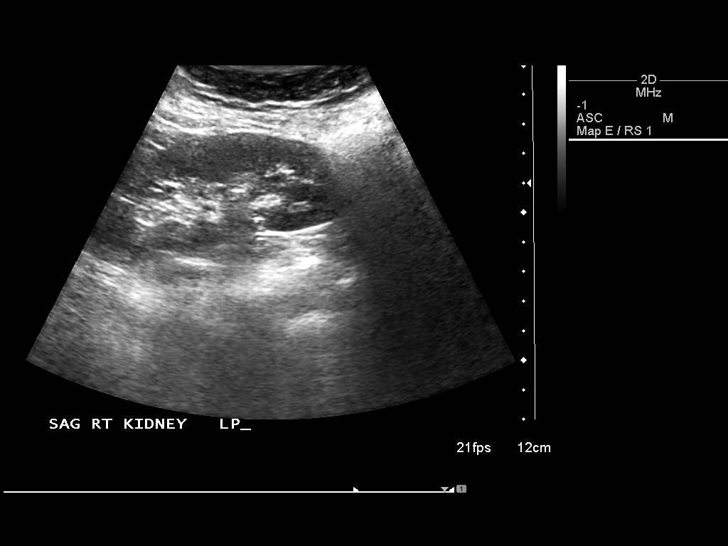
[im 21/34]
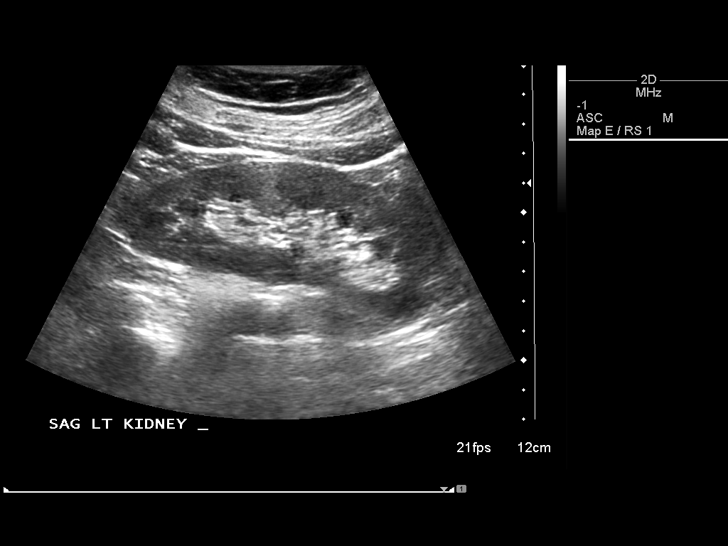
[im 23/34]
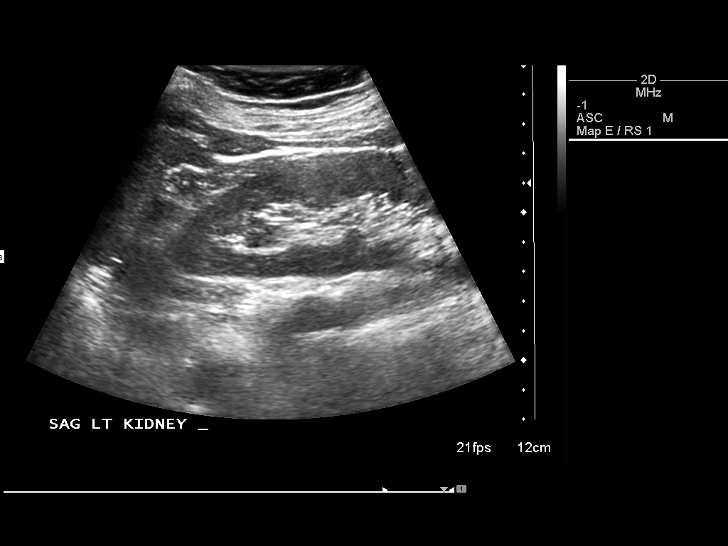
[im 25/34]
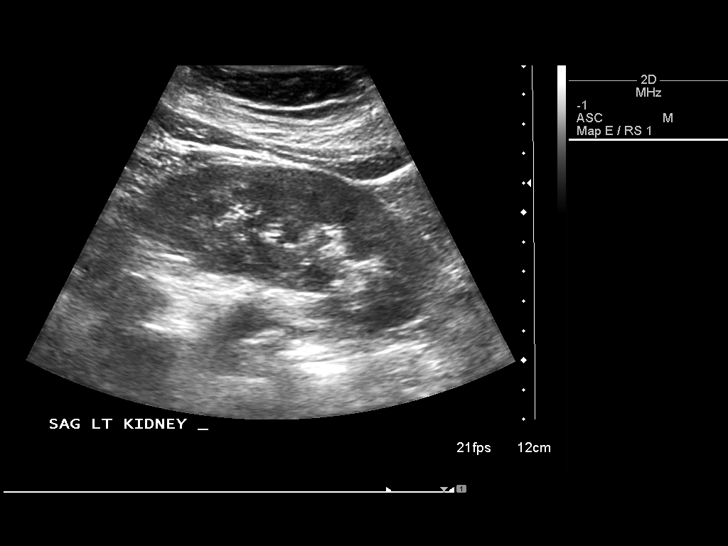
[im 28/34]
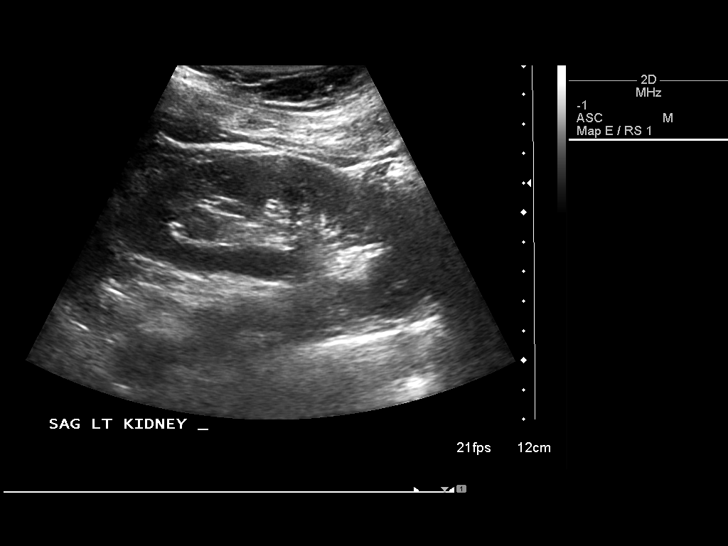
[im 31/34]
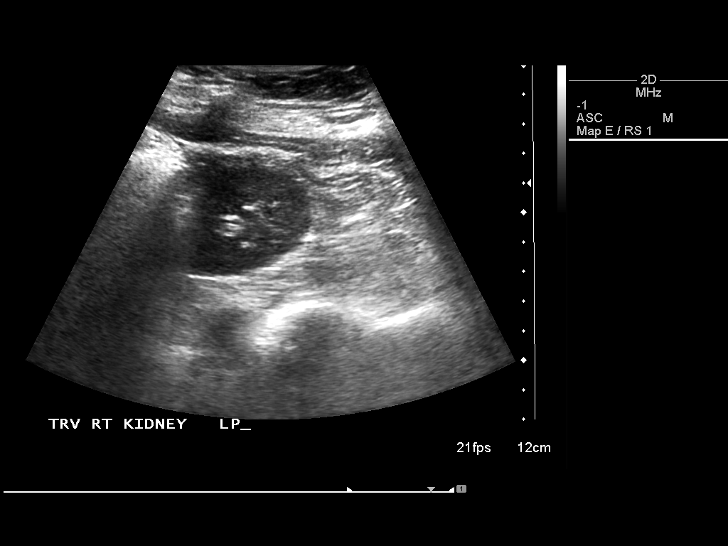
[im 34/34]
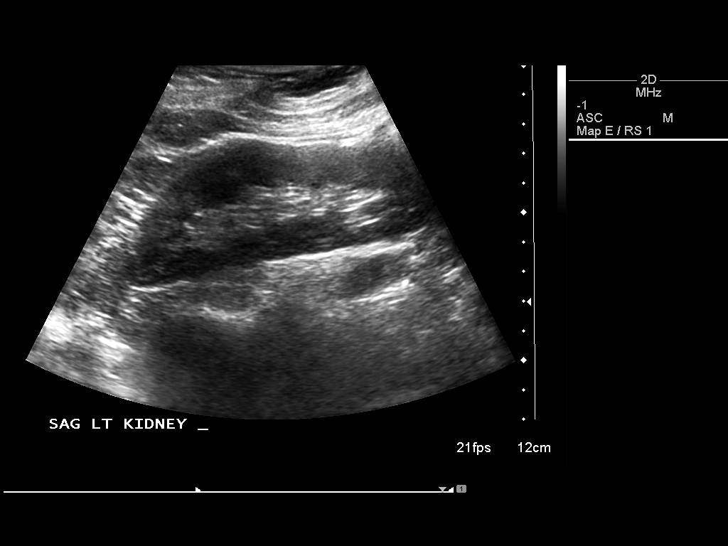

[14 of 25 positions shown; findings below may reference images not displayed]

FINDINGS: Kidneys measure 10.2 cm length right and 10.8 cm length left.
Normal renal cortical thickness and echogenicity bilaterally.
Prominent column of Bertin at mid right kidney.
No hydronephrosis, definite mass, or shadowing calcification.
Bladder unremarkable.
IMPRESSION: No acute renal abnormalities.

## 2008-07-28 DIAGNOSIS — I2510 Atherosclerotic heart disease of native coronary artery without angina pectoris: Secondary | ICD-10-CM

## 2008-07-28 DIAGNOSIS — K6289 Other specified diseases of anus and rectum: Secondary | ICD-10-CM | POA: Insufficient documentation

## 2008-07-28 DIAGNOSIS — Z8669 Personal history of other diseases of the nervous system and sense organs: Secondary | ICD-10-CM | POA: Insufficient documentation

## 2008-07-28 DIAGNOSIS — E785 Hyperlipidemia, unspecified: Secondary | ICD-10-CM | POA: Insufficient documentation

## 2008-07-28 DIAGNOSIS — F411 Generalized anxiety disorder: Secondary | ICD-10-CM | POA: Insufficient documentation

## 2008-07-28 DIAGNOSIS — K219 Gastro-esophageal reflux disease without esophagitis: Secondary | ICD-10-CM | POA: Insufficient documentation

## 2008-07-28 DIAGNOSIS — H3530 Unspecified macular degeneration: Secondary | ICD-10-CM | POA: Insufficient documentation

## 2008-07-28 DIAGNOSIS — M949 Disorder of cartilage, unspecified: Secondary | ICD-10-CM

## 2008-07-28 DIAGNOSIS — M899 Disorder of bone, unspecified: Secondary | ICD-10-CM | POA: Insufficient documentation

## 2009-01-19 ENCOUNTER — Ambulatory Visit (HOSPITAL_COMMUNITY): Admission: RE | Admit: 2009-01-19 | Discharge: 2009-01-19 | Payer: Self-pay | Admitting: Internal Medicine

## 2009-01-19 IMAGING — CR DG CHEST 2V
2 series · 2 of 2 positions shown · non-contrast
Comparison: [DATE]

CLINICAL DATA: Cough, congestion

CHEST - 2 VIEW

[view not recorded (1 of 2)]
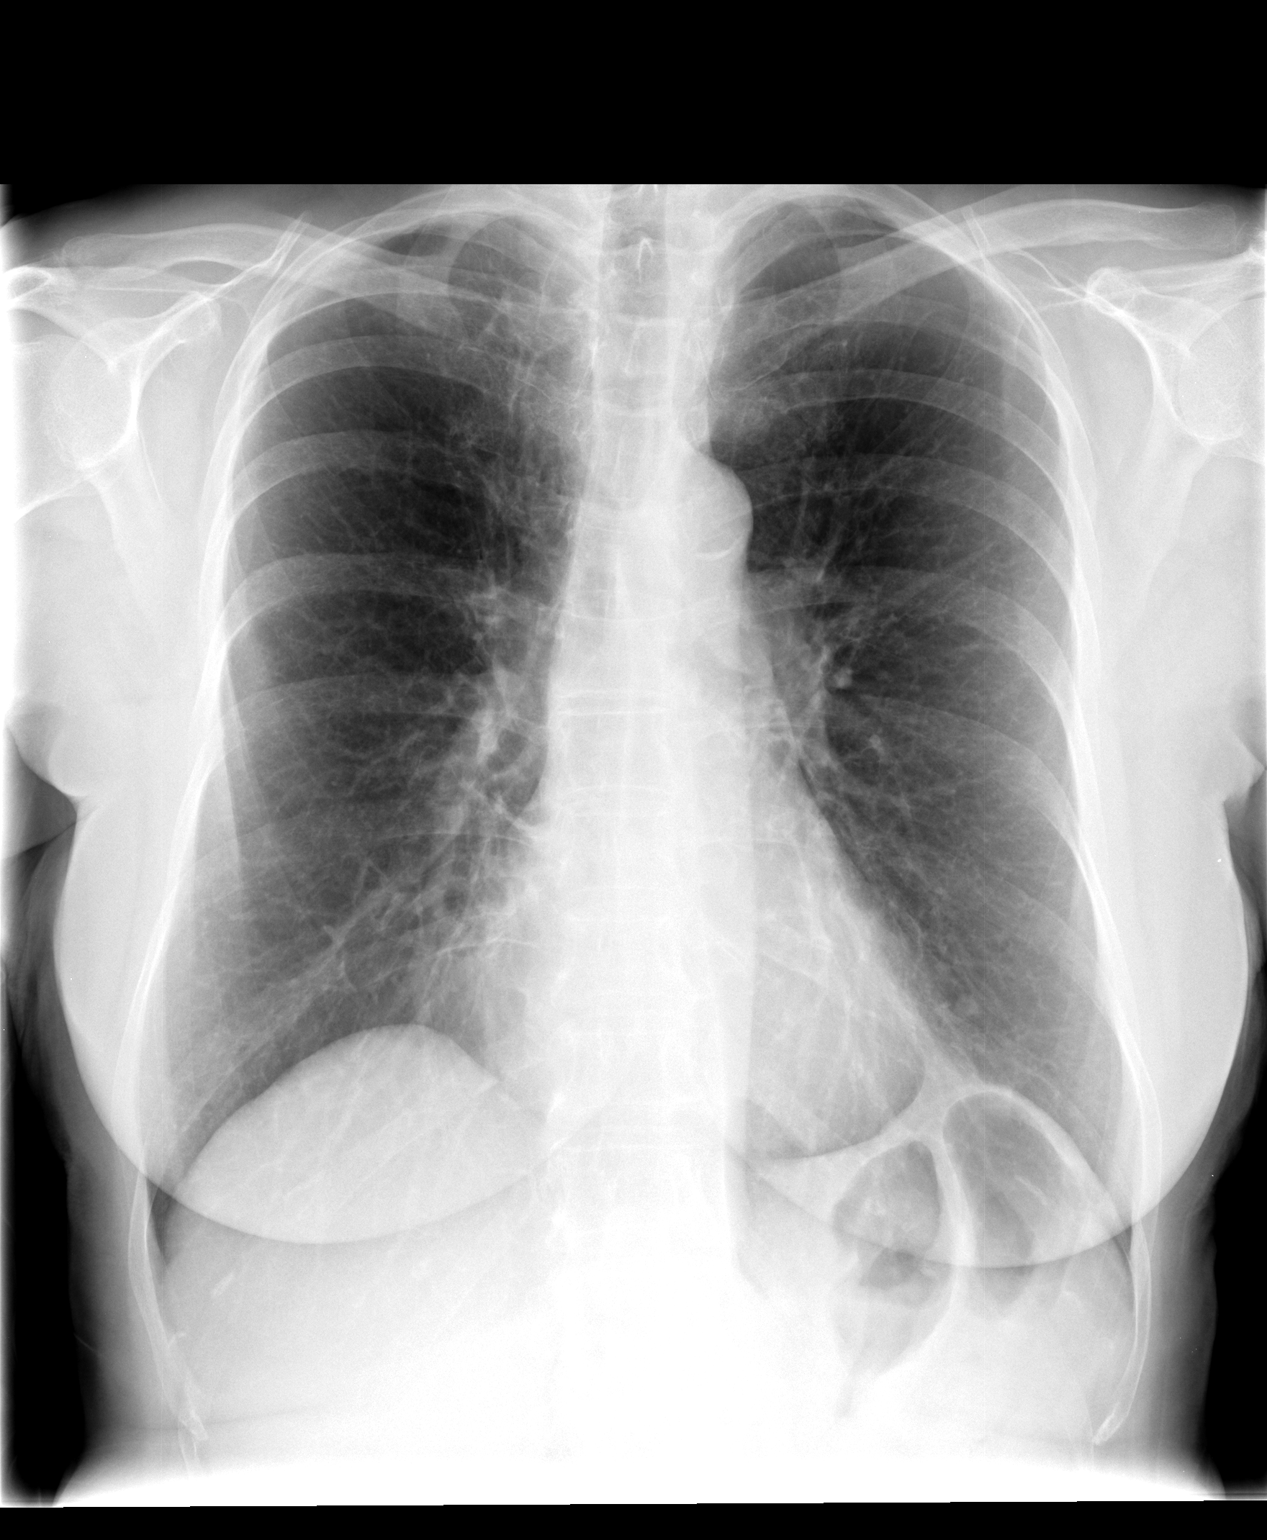

[view not recorded (2 of 2)]
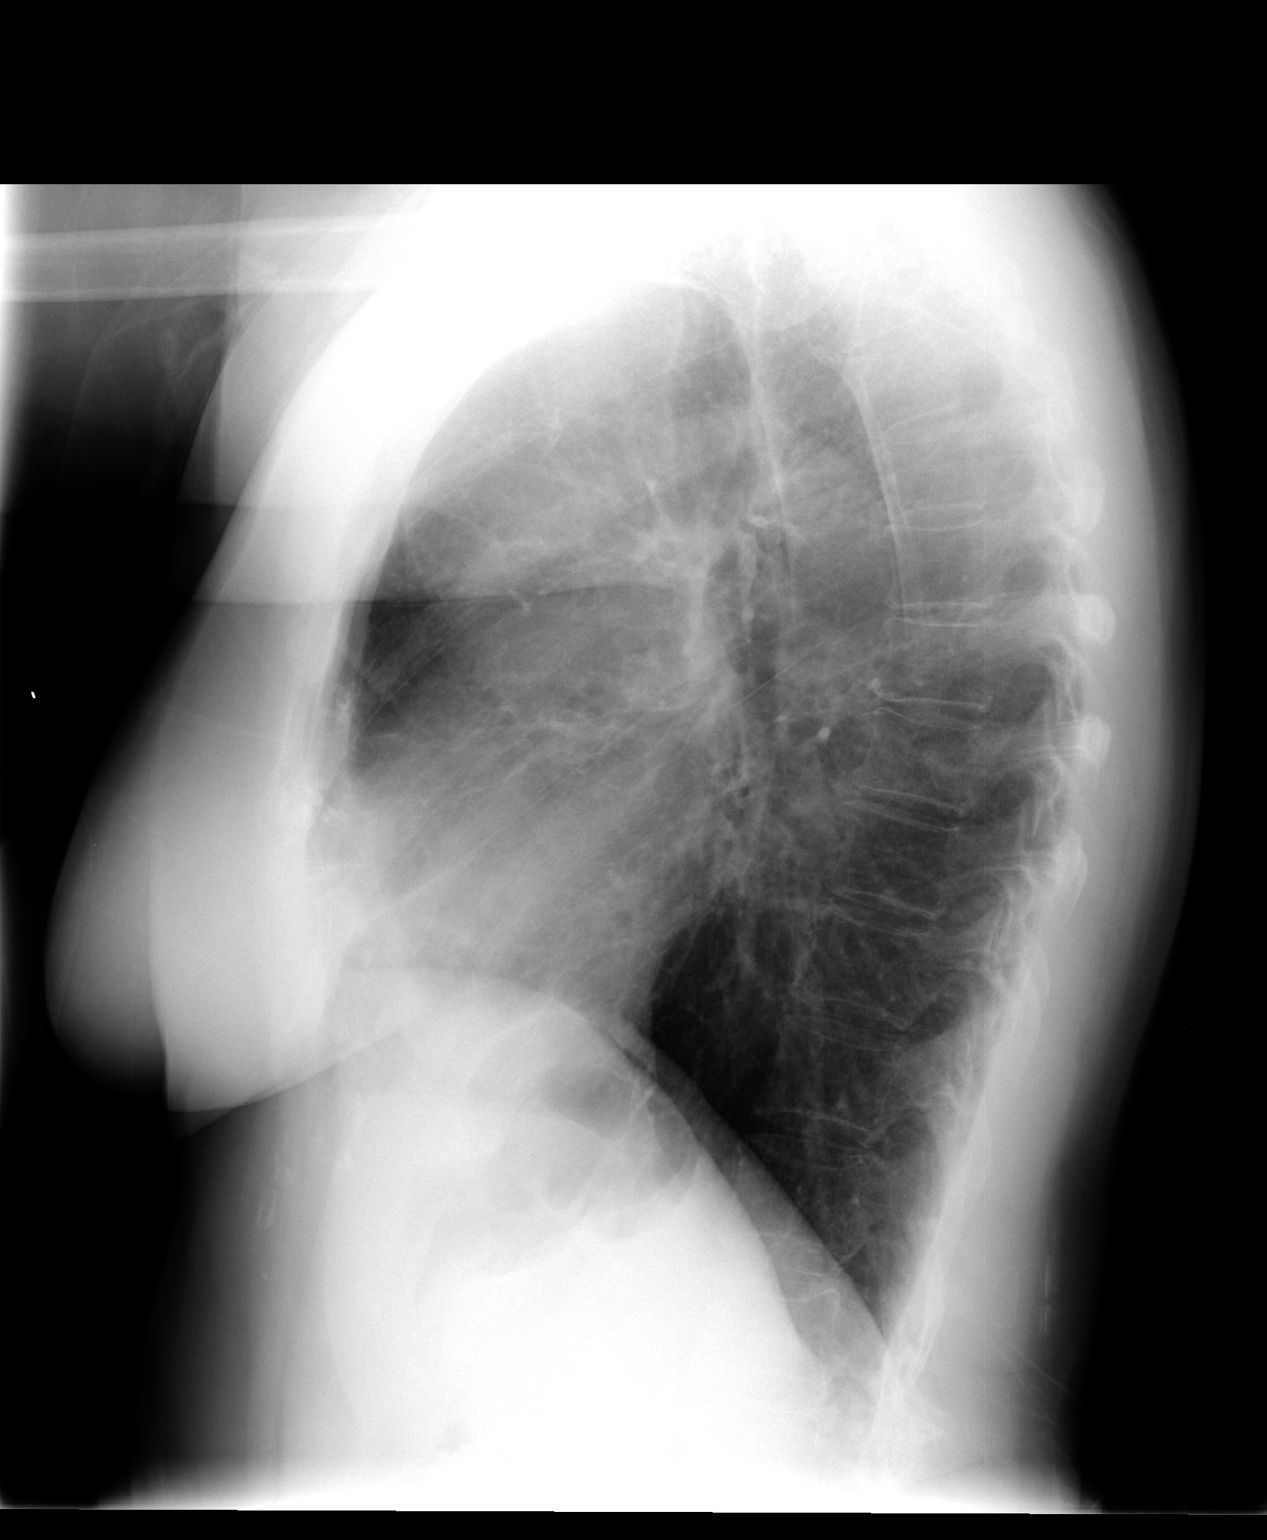

[2 of 2 positions shown; findings below may reference images not displayed]

FINDINGS: Upper normal heart size.
Normal mediastinal contours and pulmonary vascularity.
Atherosclerotic calcification at aortic arch.
Emphysematous and chronic bronchitic changes compatible with COPD.
No pulmonary infiltrate, pleural effusion, or pneumothorax.
7 mm diameter nodular density lower left chest, not seen on
preceding chest radiograph but unchanged from an earlier study of
the [DATE].
Diffuse bony demineralization.
IMPRESSION: COPD/chronic bronchitis without acute infiltrate.

## 2009-04-04 ENCOUNTER — Ambulatory Visit (HOSPITAL_COMMUNITY): Admission: RE | Admit: 2009-04-04 | Discharge: 2009-04-04 | Payer: Self-pay | Admitting: Internal Medicine

## 2009-04-04 IMAGING — CT CT HEAD W/O CM
1 series · 16 of 30 positions shown, 20 images · non-contrast
Comparison: Head CT [DATE]

CLINICAL DATA: Left occipital headache

CT HEAD WITHOUT CONTRAST
TECHNIQUE: Contiguous axial images were obtained from the base of
the skull through the vertex without contrast.

[Series 2: headseq 4.8 h37s · axial · 0.43mm/px · z∈[+64,+219]mm · 16 of 36 slices shown, 20 images]
[im 2/36  brain]
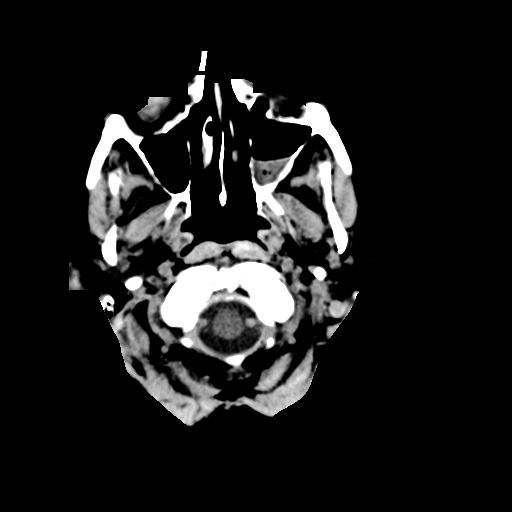
[im 2/36  bone]
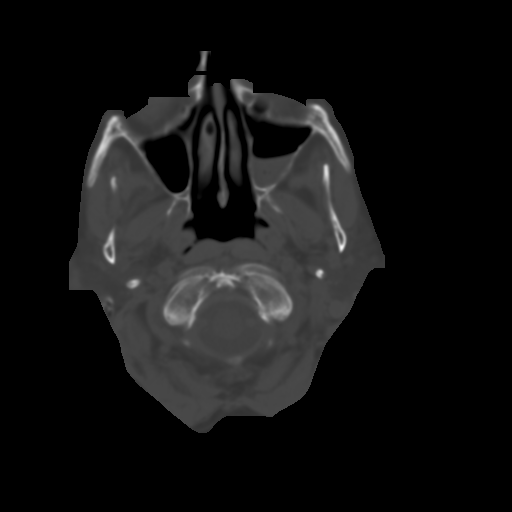
[im 4/36  brain]
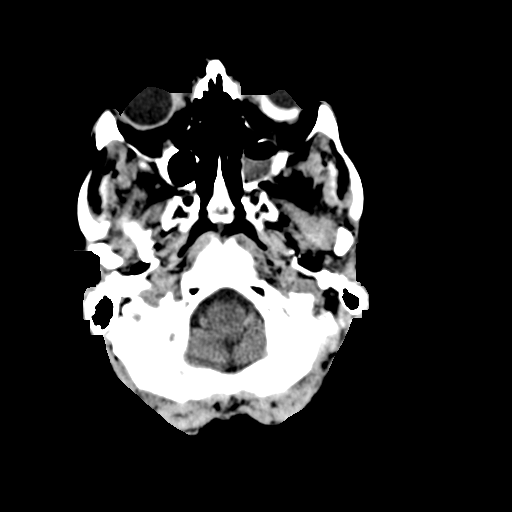
[im 7/36  brain]
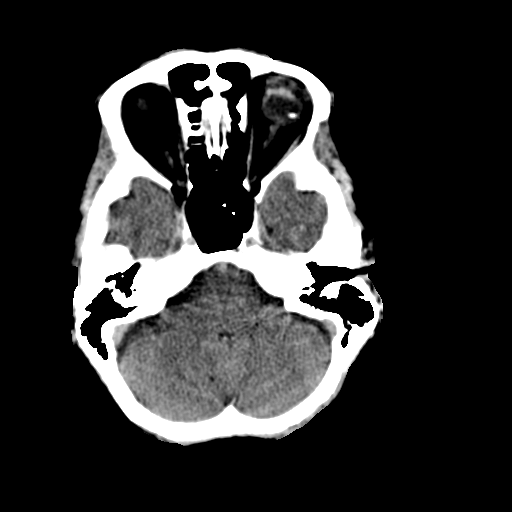
[im 9/36  brain]
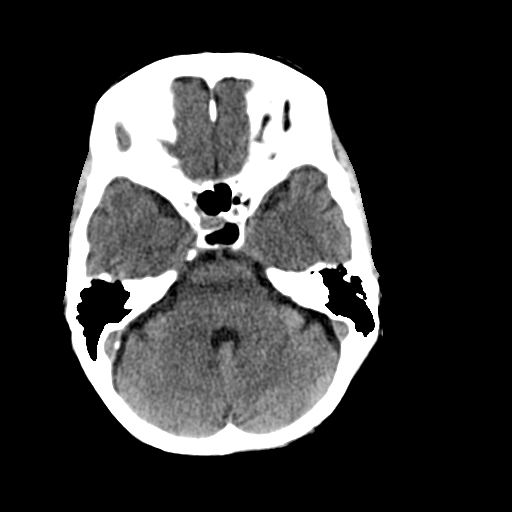
[im 10/36  brain]
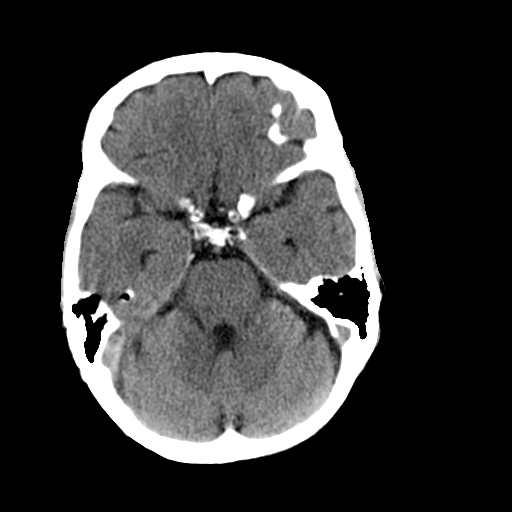
[im 10/36  bone]
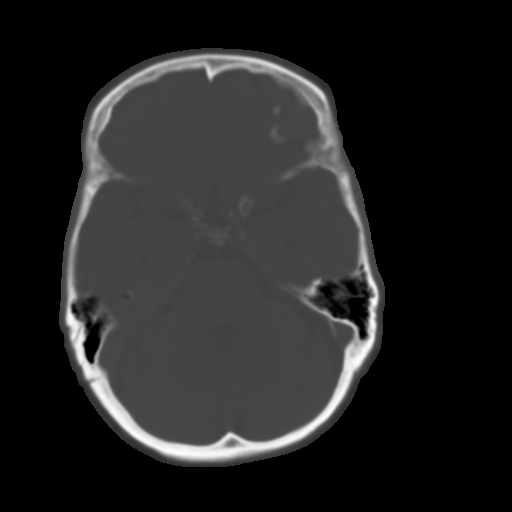
[im 13/36  brain]
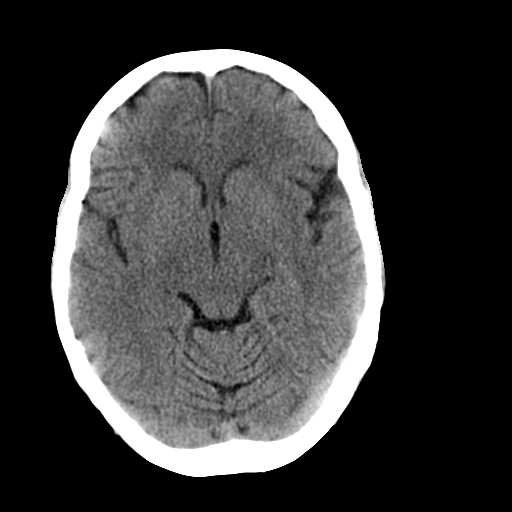
[im 15/36  brain]
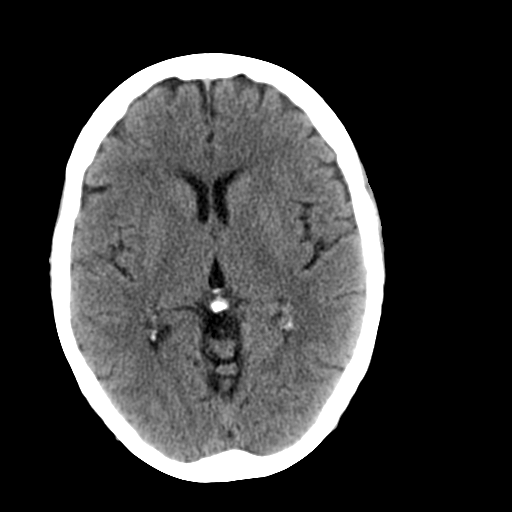
[im 17/36  brain]
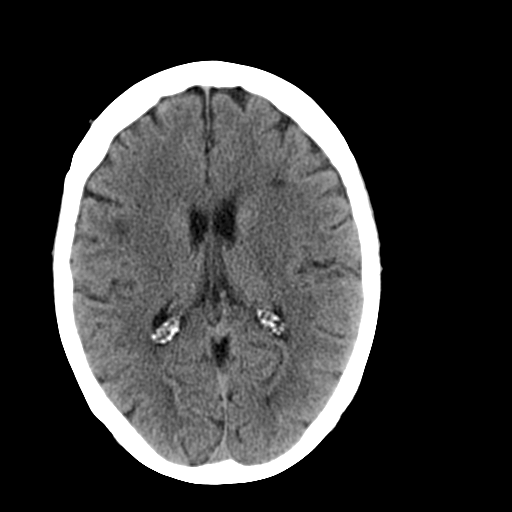
[im 19/36  brain]
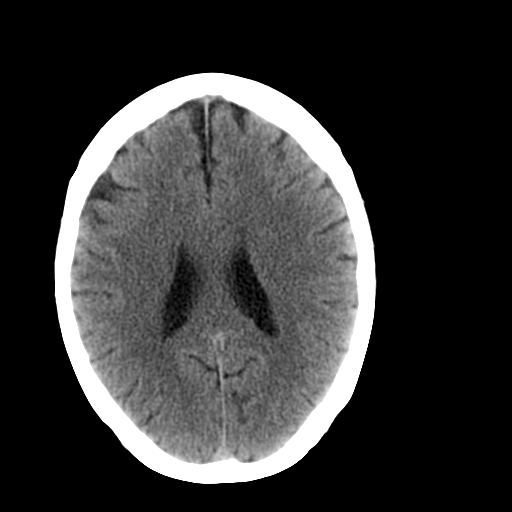
[im 19/36  bone]
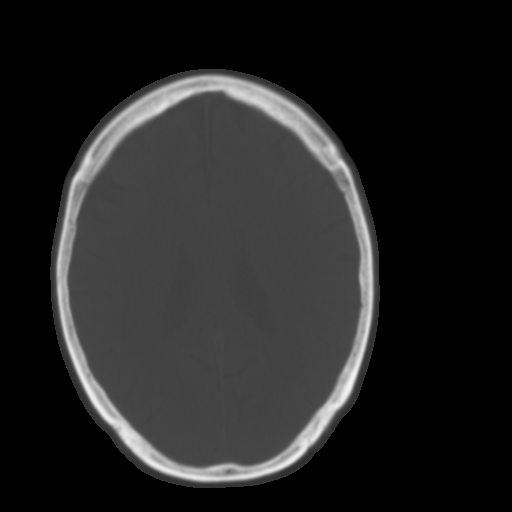
[im 21/36  brain]
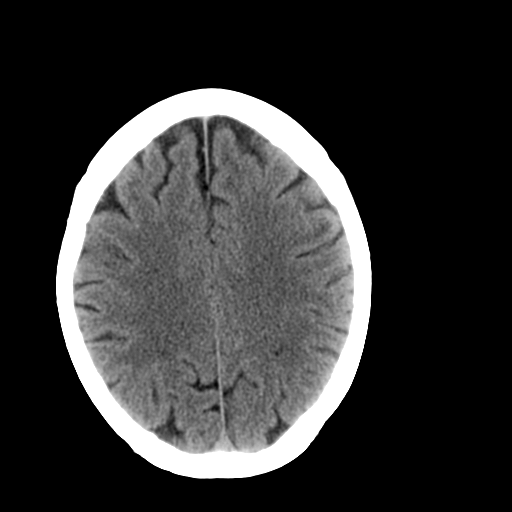
[im 23/36  brain]
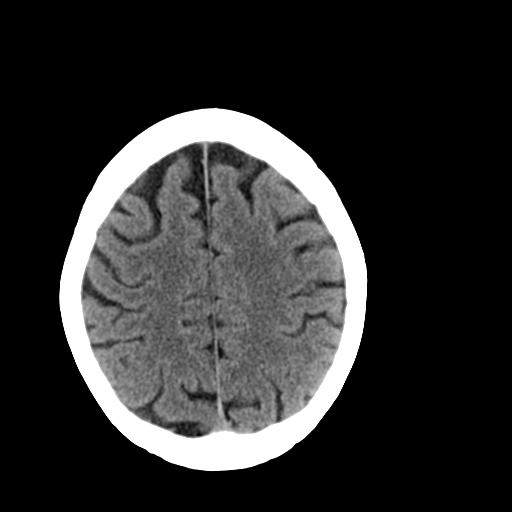
[im 26/36  brain]
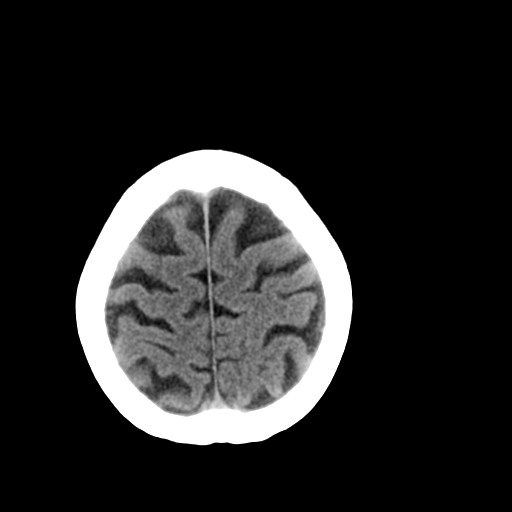
[im 27/36  brain]
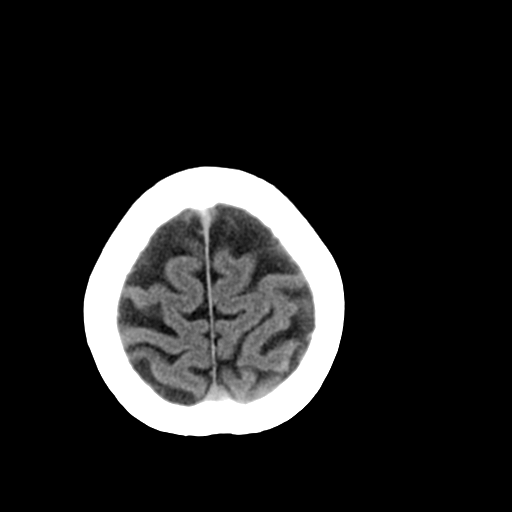
[im 27/36  bone]
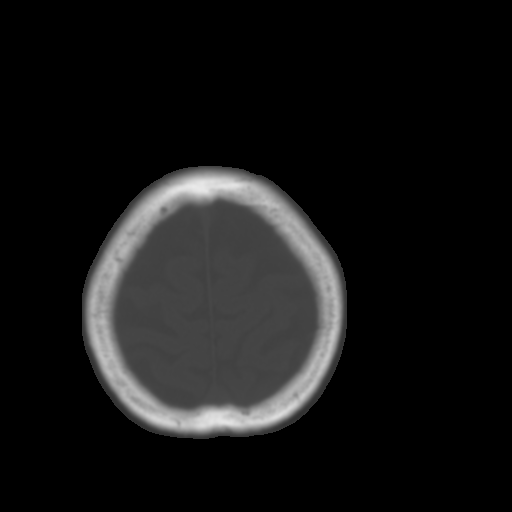
[im 29/36  brain]
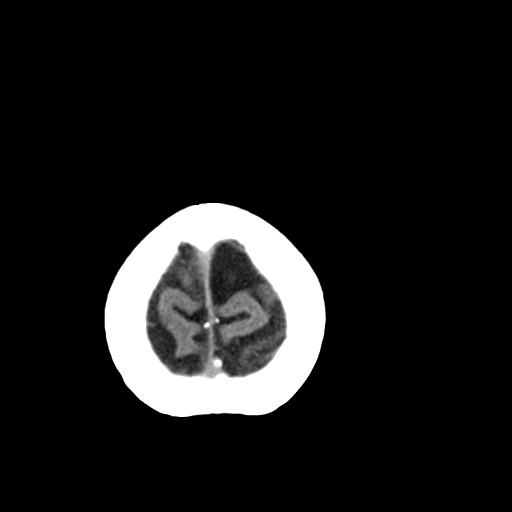
[im 32/36  brain]
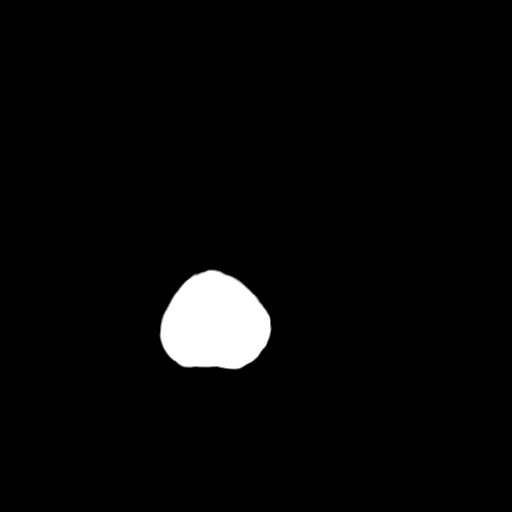
[im 34/36  brain]
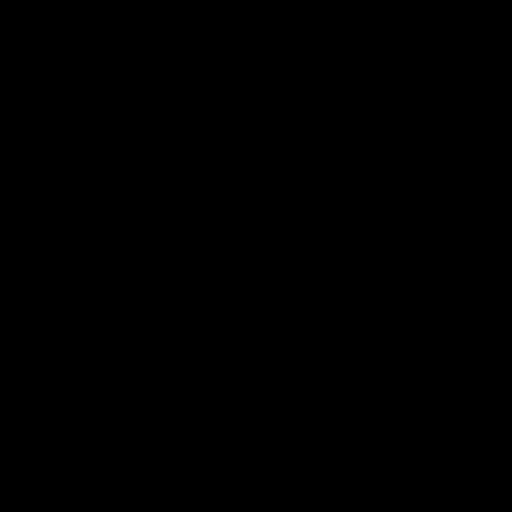

[16 of 30 positions shown; findings below may reference images not displayed]

FINDINGS: No acute intracranial hemorrhage.  No focal mass lesion.
No hydrocephalus midline shift or mass effect.  There are bilateral
frontal subcortical hypodensities which are not changed from prior.
No CT evidence of acute infarction.

Paranasal sinuses demonstrate mucosal thickening in the left
maxillary sinus.  Mastoid air cells are clear.  Orbits are normal.
IMPRESSION: 1.  No CT evidence of acute infarction.
2.  No evidence of intracranial hemorrhage.
3.  Subcortical white matter hypodensities consistent with small
vessel ischemia.
4.  Left maxillary sinusitis.

## 2009-08-14 ENCOUNTER — Encounter: Payer: Self-pay | Admitting: Gastroenterology

## 2009-09-13 ENCOUNTER — Encounter: Payer: Self-pay | Admitting: Urgent Care

## 2009-09-18 ENCOUNTER — Ambulatory Visit: Payer: Self-pay | Admitting: Gastroenterology

## 2009-09-18 DIAGNOSIS — R079 Chest pain, unspecified: Secondary | ICD-10-CM | POA: Insufficient documentation

## 2009-09-20 ENCOUNTER — Ambulatory Visit (HOSPITAL_COMMUNITY): Admission: RE | Admit: 2009-09-20 | Discharge: 2009-09-20 | Payer: Self-pay | Admitting: Internal Medicine

## 2009-09-20 IMAGING — MG MM DIGITAL SCREENING
5 series · 5 of 5 positions shown · non-contrast
Comparison: none

DG SCREEN MAMMOGRAM BILATERAL
Bilateral CC and MLO view(s) were taken.

DIGITAL SCREENING MAMMOGRAM WITH CAD:
The breast tissue is heterogeneously dense.  No masses or malignant type calcifications are 
identified.  Compared with prior studies.
Images were processed with CAD.

[L CC (1 of 2)]
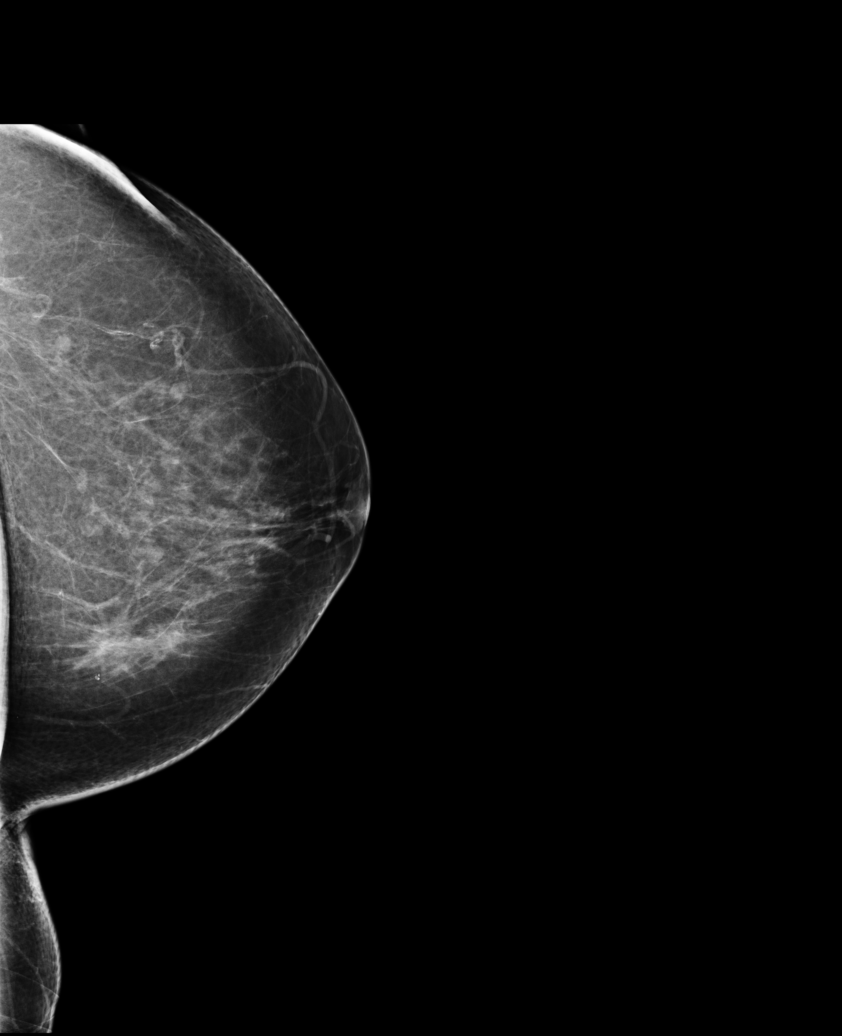

[L MLO]
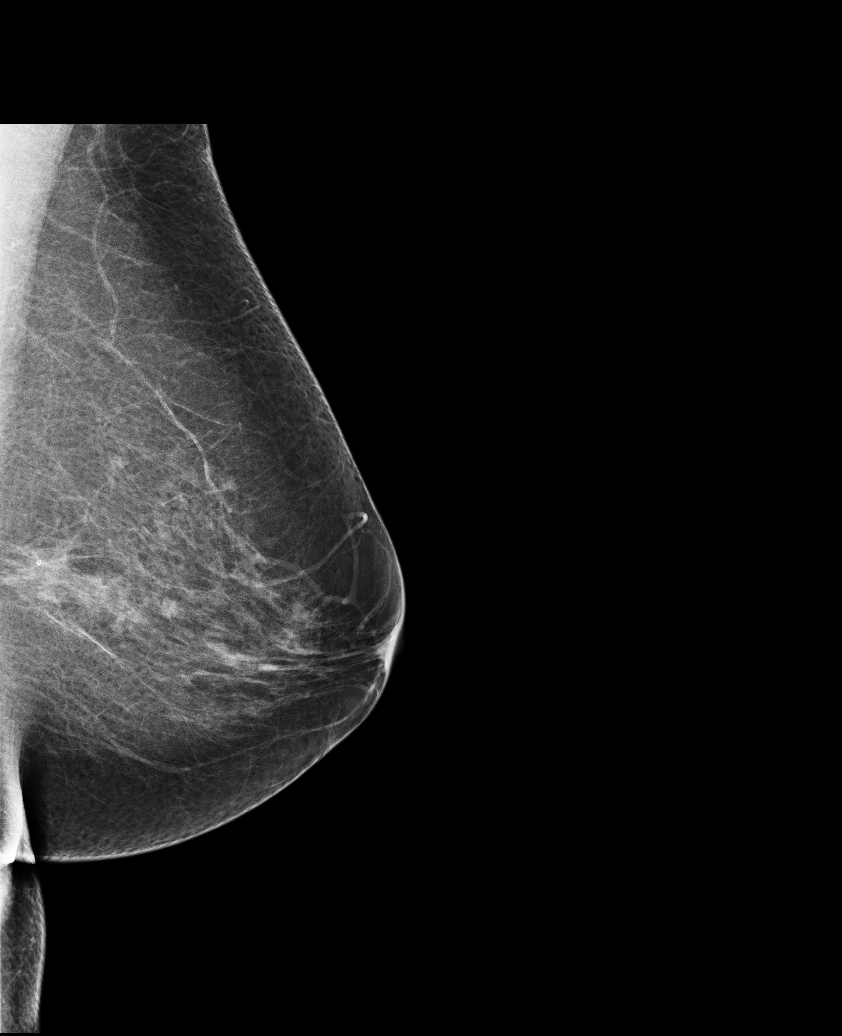

[R CC]
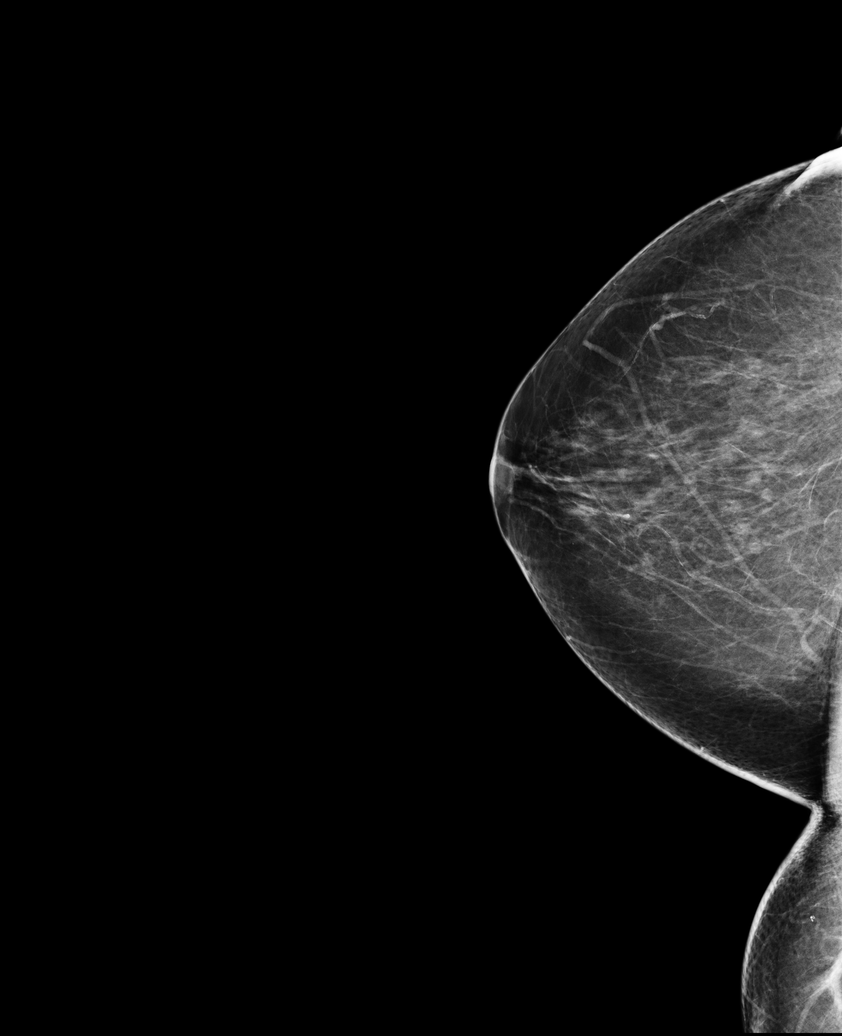

[R MLO]
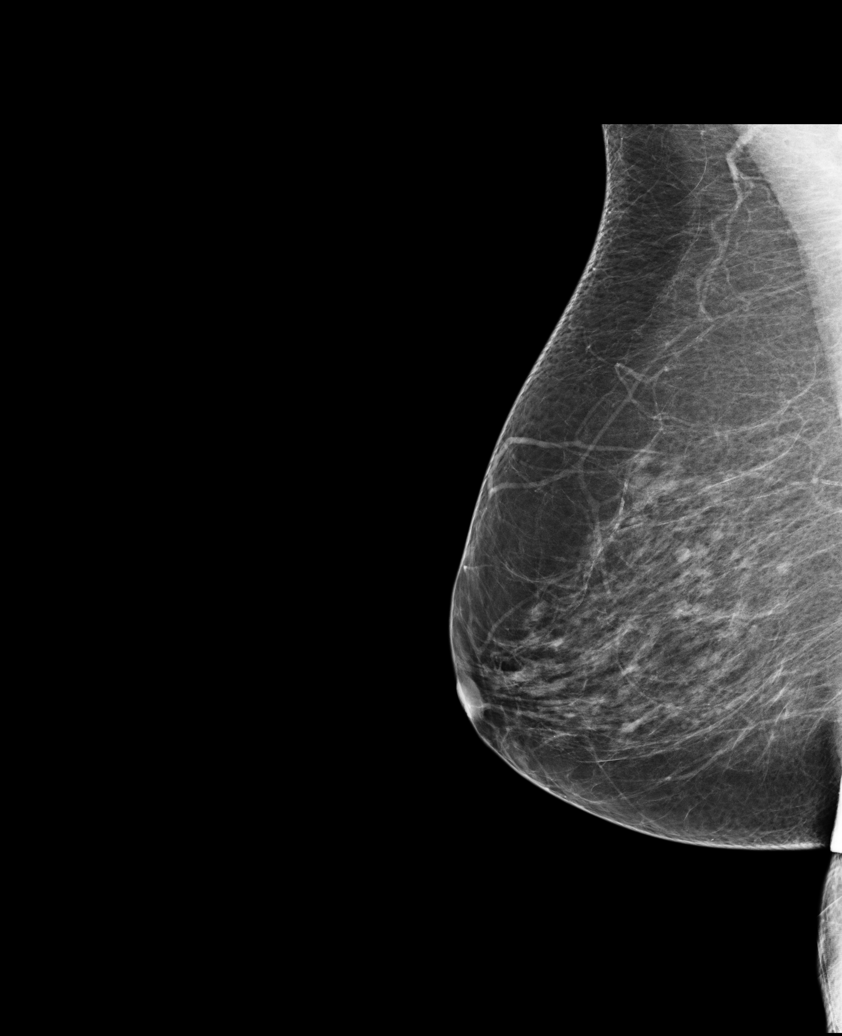

[L CC (2 of 2)]
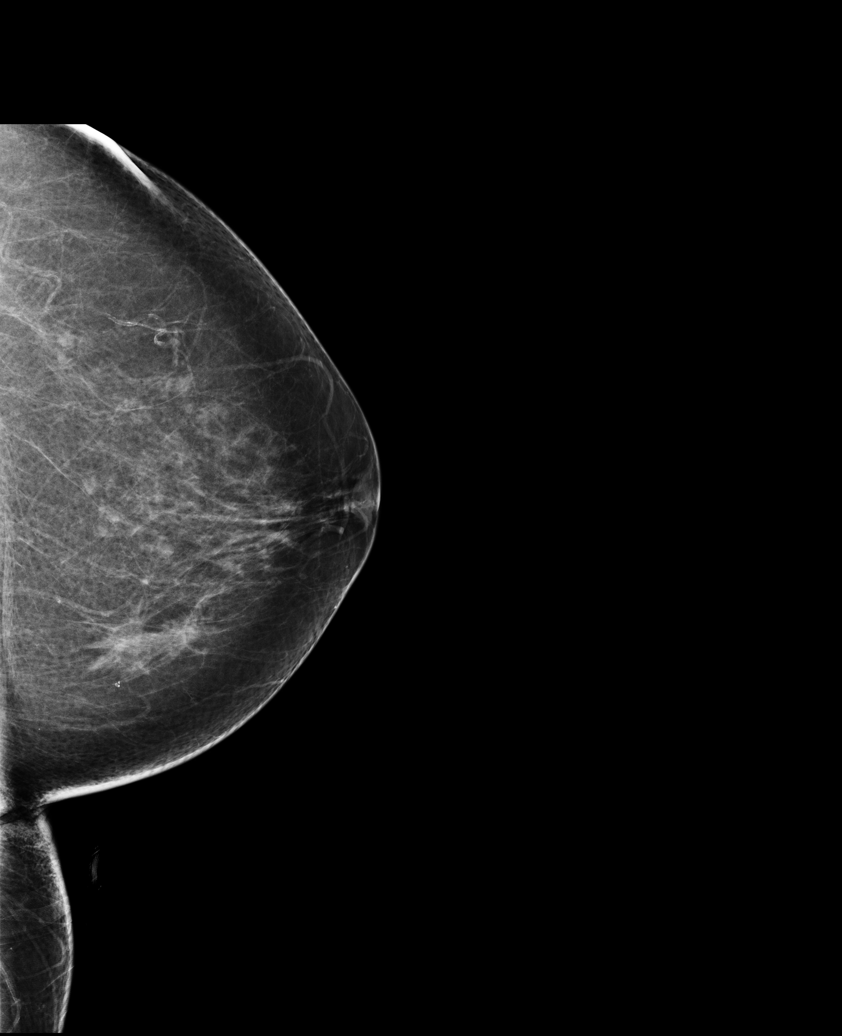

[5 of 5 positions shown; findings below may reference images not displayed]

IMPRESSION: No specific mammographic evidence of malignancy.  Next screening mammogram is recommended in one 
year.

A result letter of this screening mammogram will be mailed directly to the patient.

ASSESSMENT: Negative - BI-RADS 1

Screening mammogram in 1 year.
,

## 2009-12-18 ENCOUNTER — Ambulatory Visit (HOSPITAL_COMMUNITY): Admission: RE | Admit: 2009-12-18 | Discharge: 2009-12-18 | Payer: Self-pay | Admitting: Internal Medicine

## 2009-12-18 IMAGING — CR DG RIBS 2V*L*
3 series · 3 of 3 positions shown · non-contrast
Comparison: Chest radiograph [DATE]

CLINICAL DATA: Left chest pain, rib pain

LEFT RIBS - 2 VIEW

[view not recorded (1 of 3)]
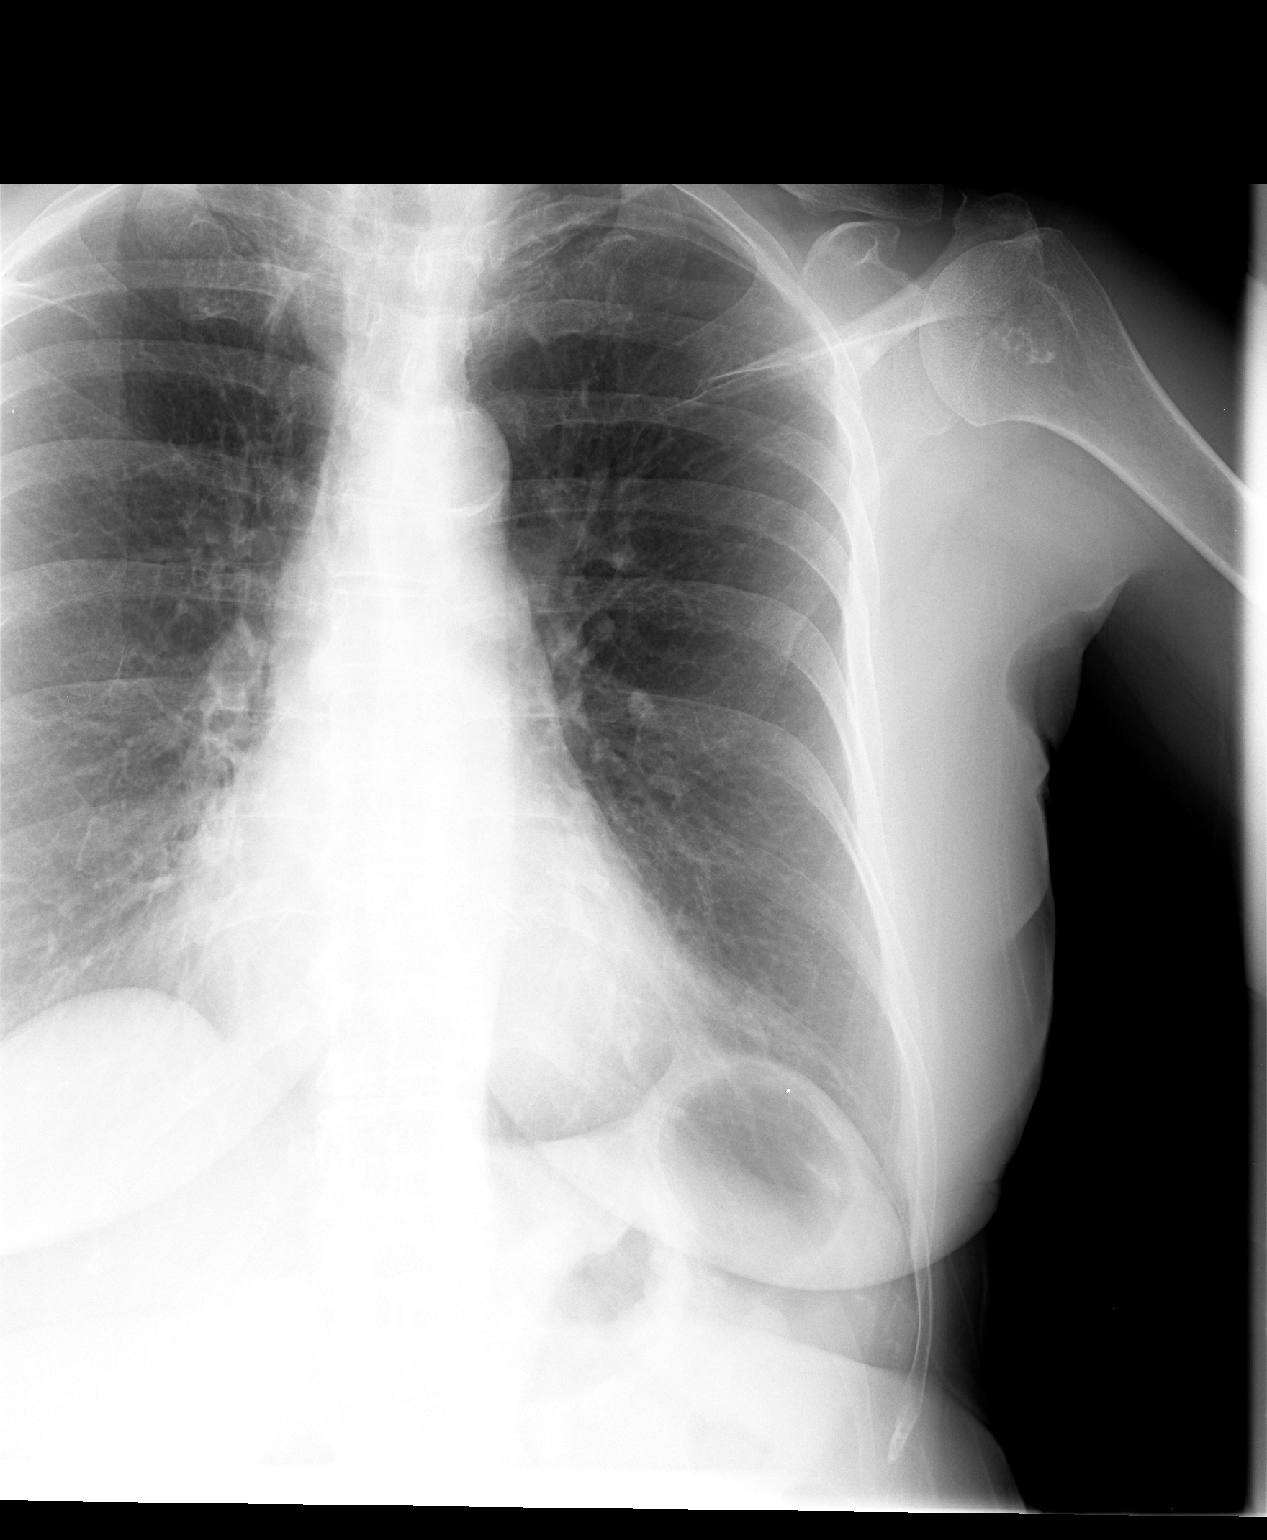

[view not recorded (2 of 3)]
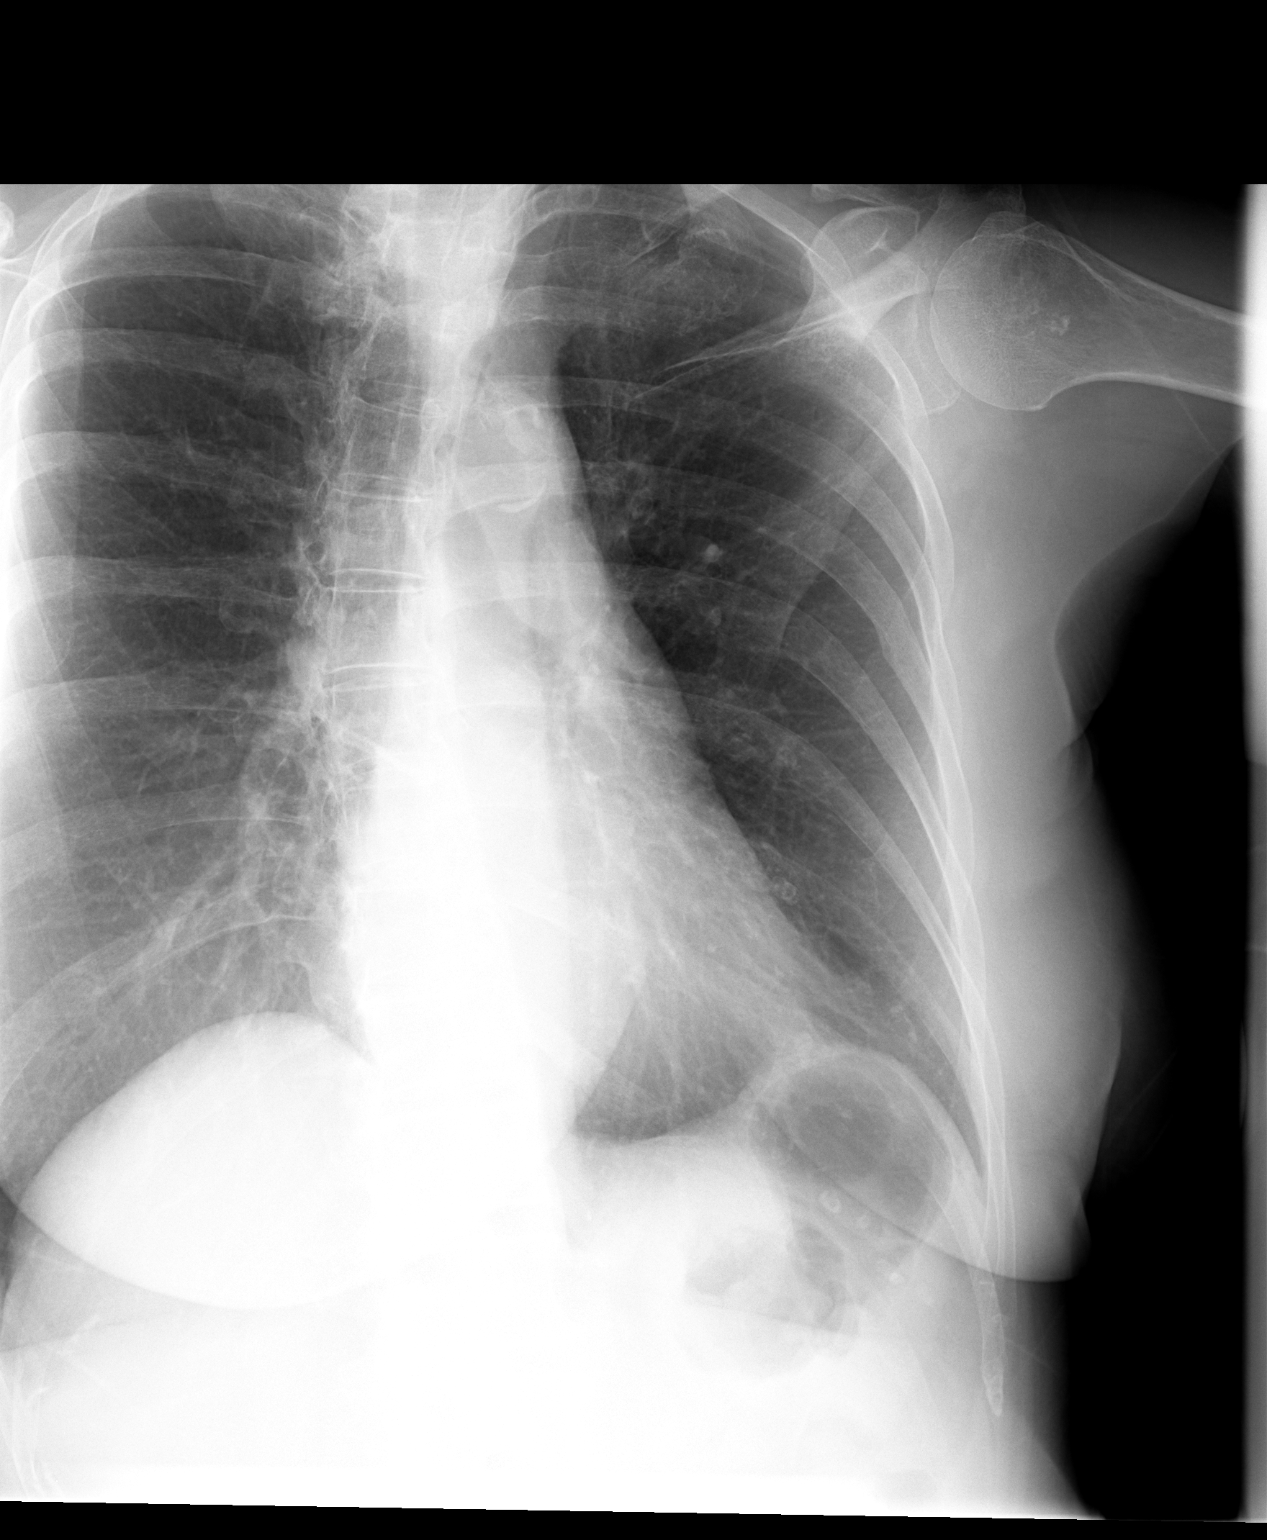

[view not recorded (3 of 3)]
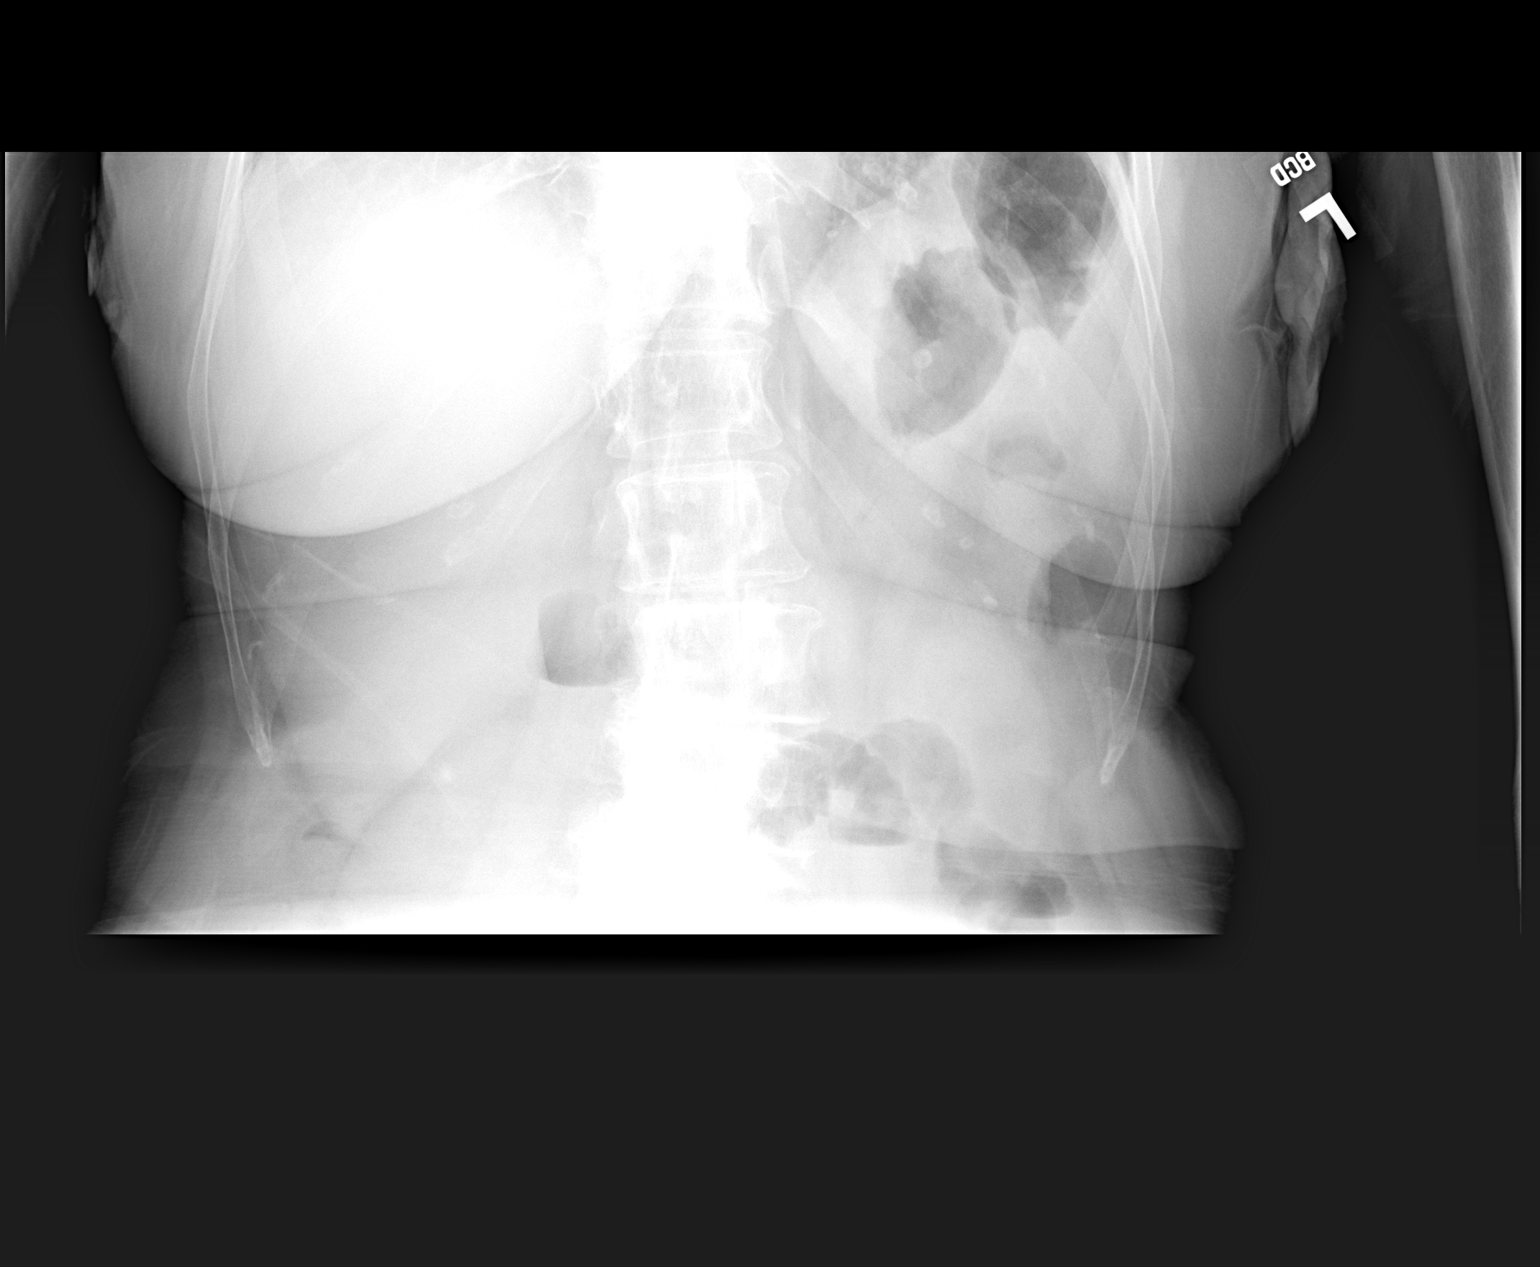

[3 of 3 positions shown; findings below may reference images not displayed]

FINDINGS: Diffuse bony demineralization.
Deformity of posterolateral left 6th rib, likely old healed
fracture.
No definite acute left rib fracture or bone destruction.
Minimal scattered costal cartilaginous calcifications.
IMPRESSION: Probably old fracture posterolateral left 6th rib.
No acute bony abnormalities.
Bony demineralization.

## 2009-12-18 IMAGING — CR DG CHEST 2V
2 series · 2 of 2 positions shown · non-contrast
Comparison: [DATE]

CLINICAL DATA: Left rib pain, chest pain

CHEST - 2 VIEW

[view not recorded (1 of 2)]
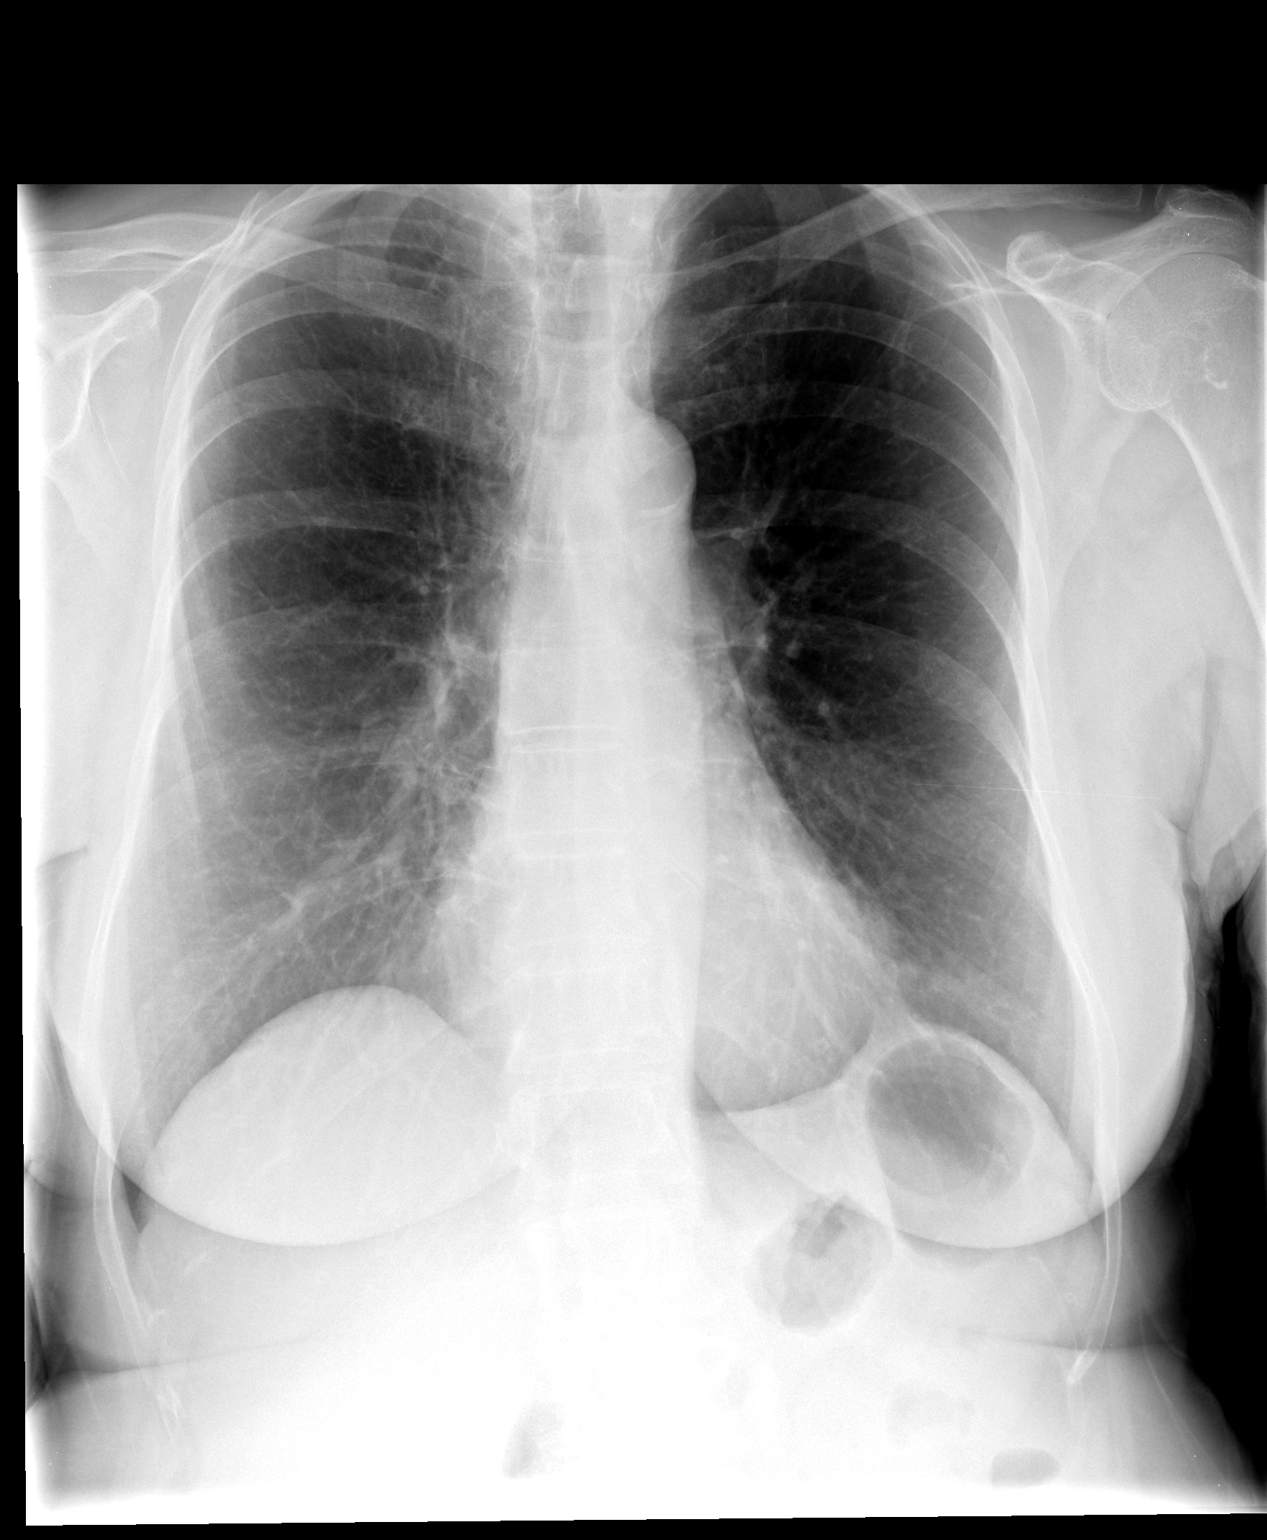

[view not recorded (2 of 2)]
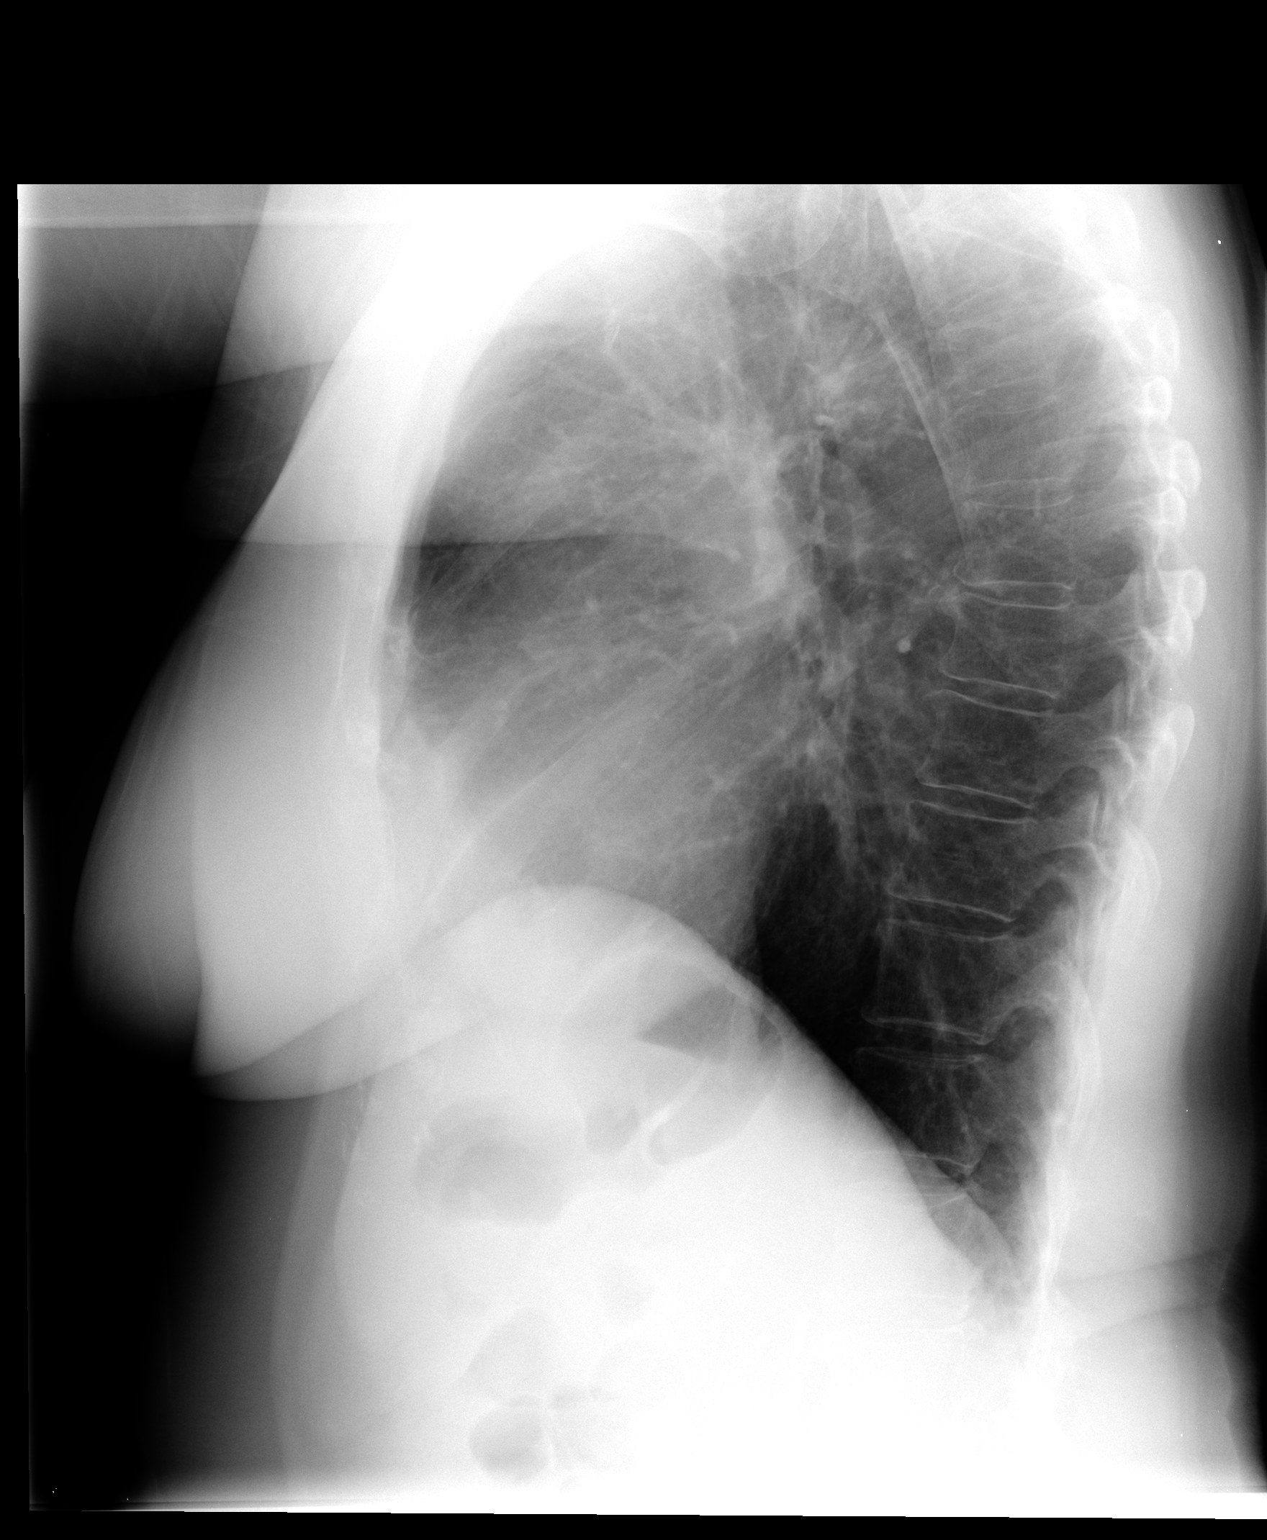

[2 of 2 positions shown; findings below may reference images not displayed]

FINDINGS: Upper normal heart size.
Normal mediastinal contours and pulmonary vascularity.
Atherosclerotic calcifications aortic arch.
Mild emphysematous changes likely COPD.
No definite pulmonary infiltrate, pleural effusion, or
pneumothorax.
Question nodular density on previous exam at left lung base is less
prominent on current study.
Bones appear diffusely demineralized.
IMPRESSION: COPD.
No acute abnormalities.

## 2010-04-03 ENCOUNTER — Emergency Department (HOSPITAL_COMMUNITY): Admission: EM | Admit: 2010-04-03 | Discharge: 2010-04-03 | Payer: Self-pay | Admitting: Emergency Medicine

## 2010-04-03 ENCOUNTER — Encounter: Payer: Self-pay | Admitting: Orthopedic Surgery

## 2010-04-03 IMAGING — CR DG ELBOW COMPLETE 3+V*L*
4 series · 4 of 4 positions shown · non-contrast
Comparison: None.

CLINICAL DATA: Injury

LEFT ELBOW - COMPLETE 3+ VIEW

[view not recorded (1 of 4)]
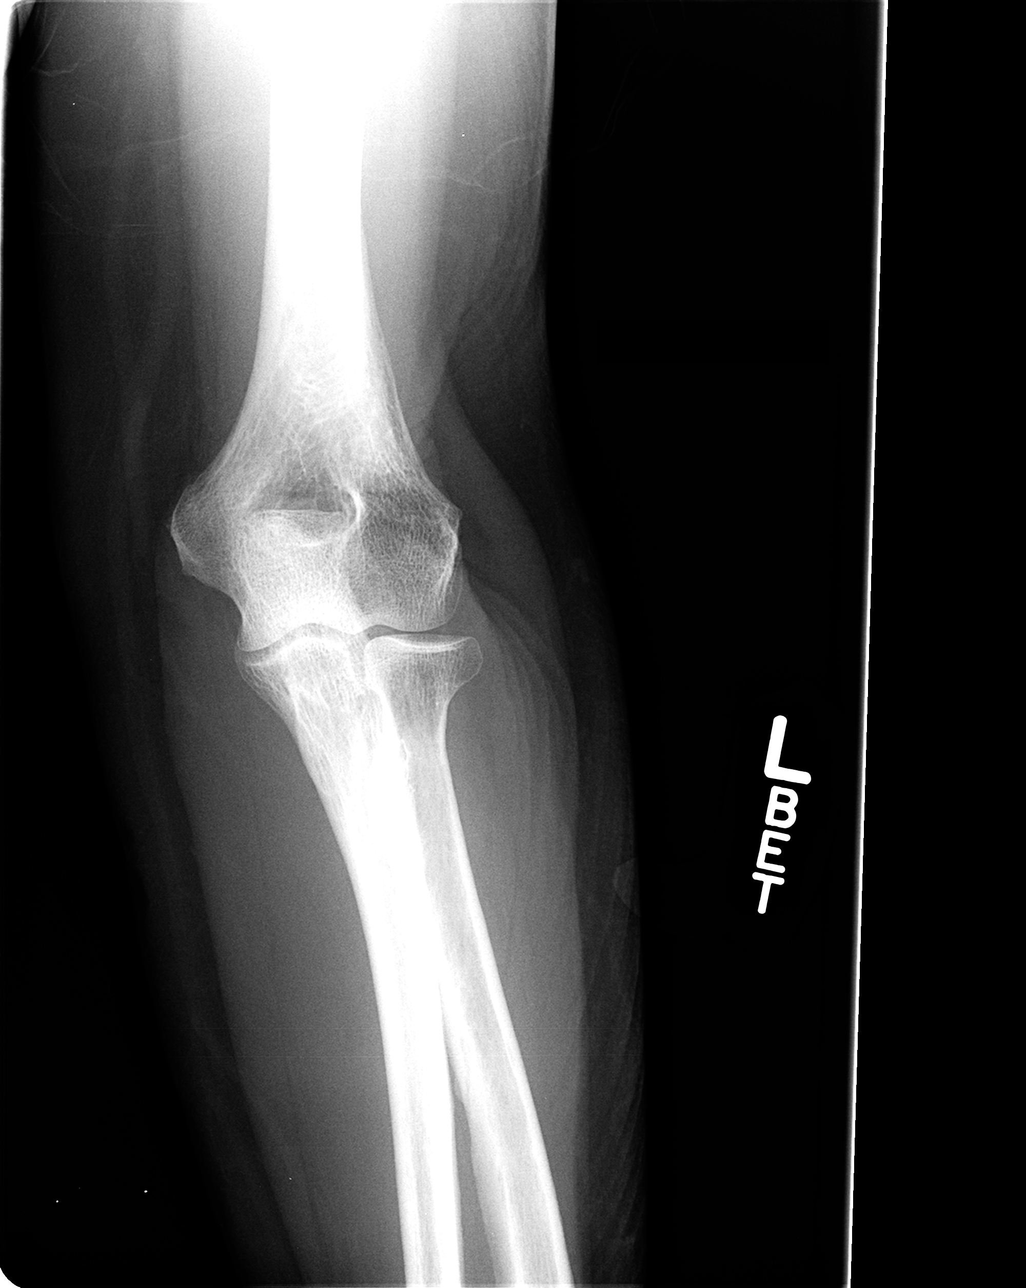

[view not recorded (2 of 4)]
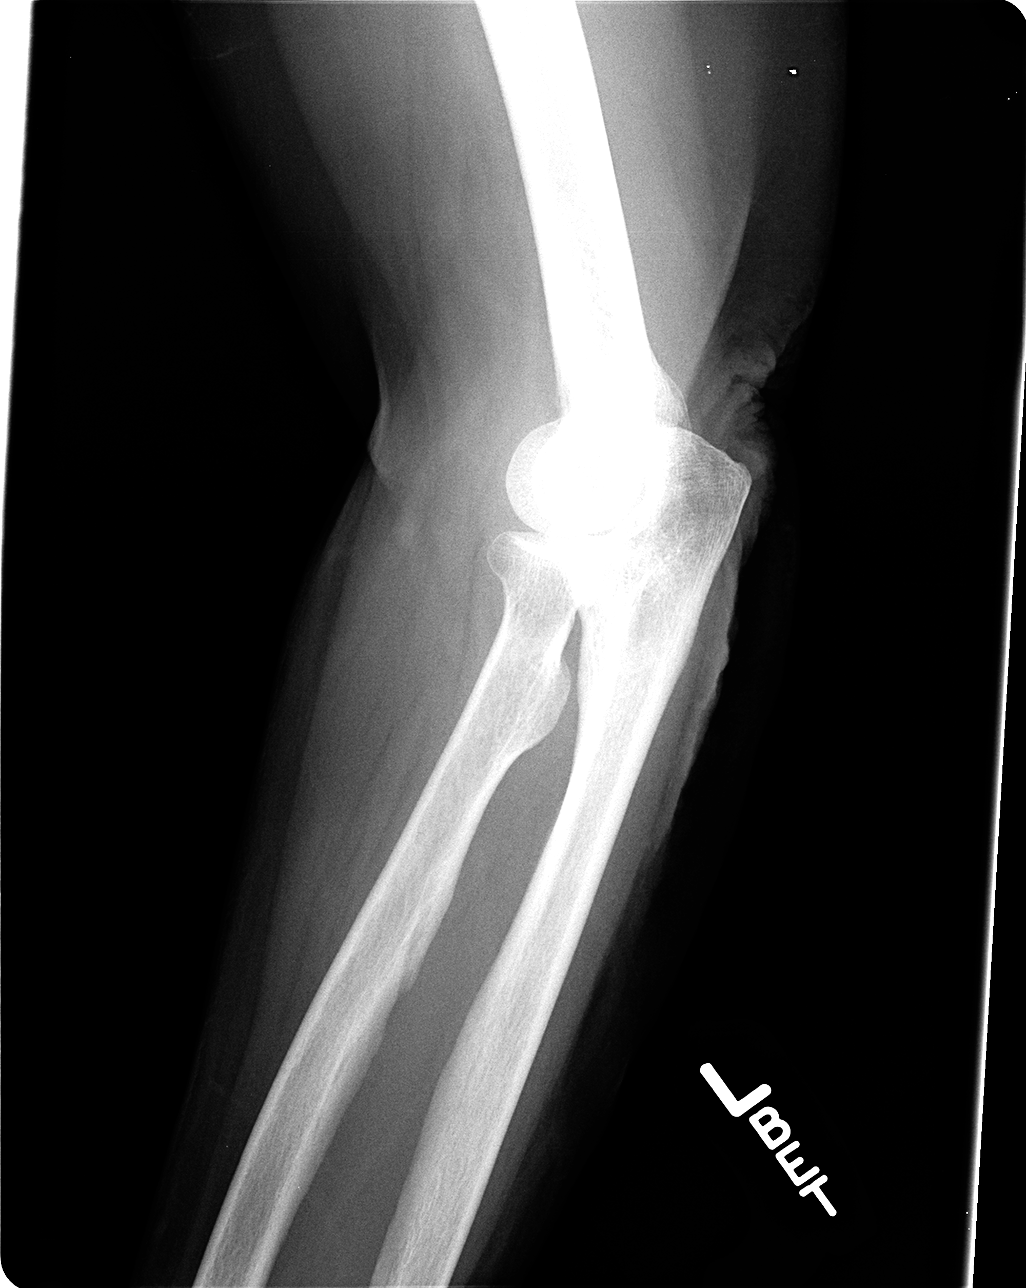

[view not recorded (3 of 4)]
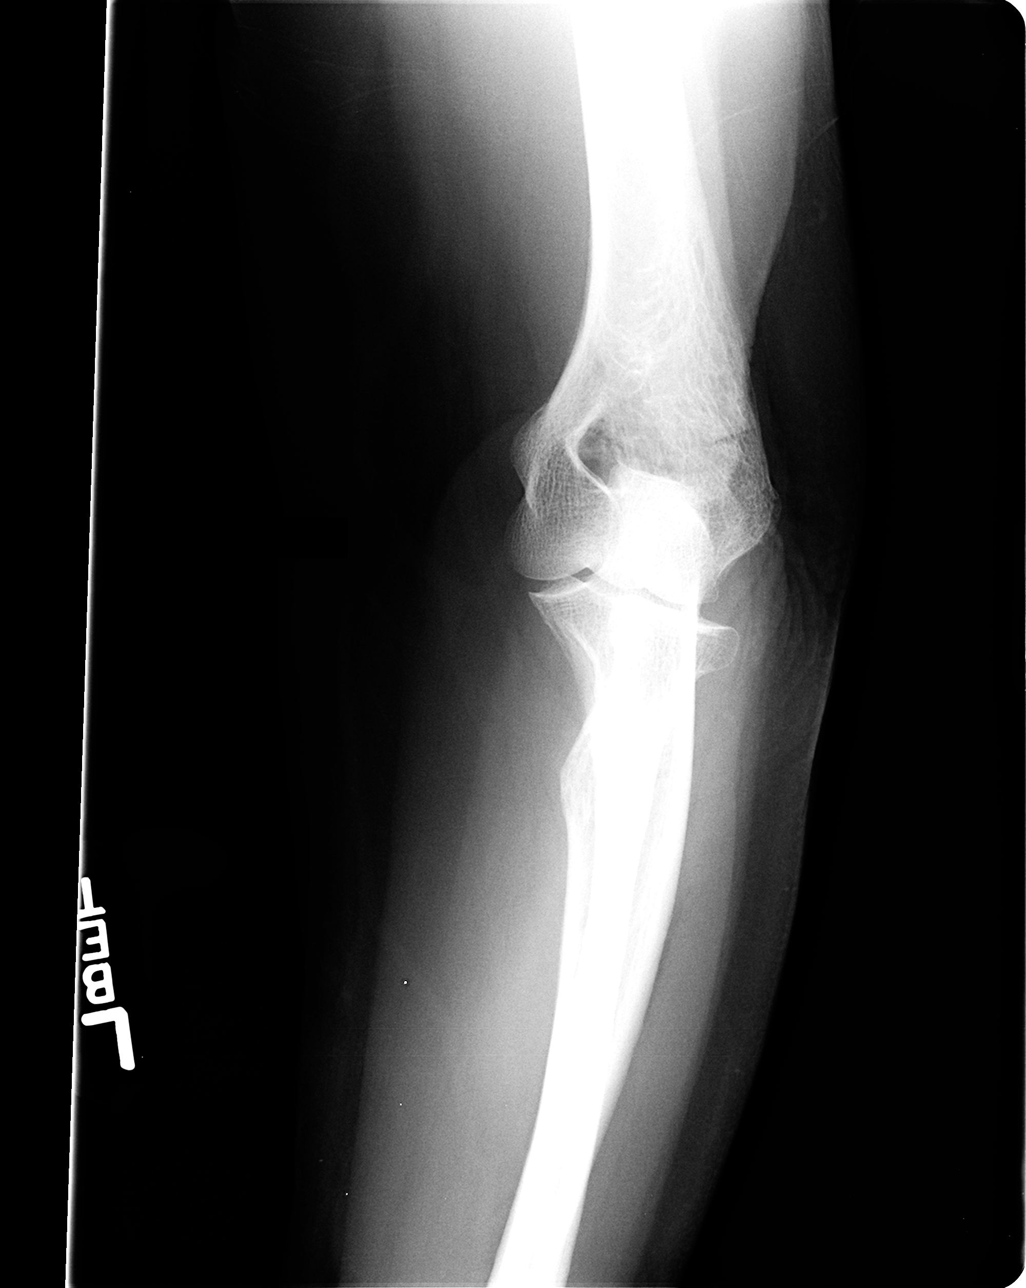

[view not recorded (4 of 4)]
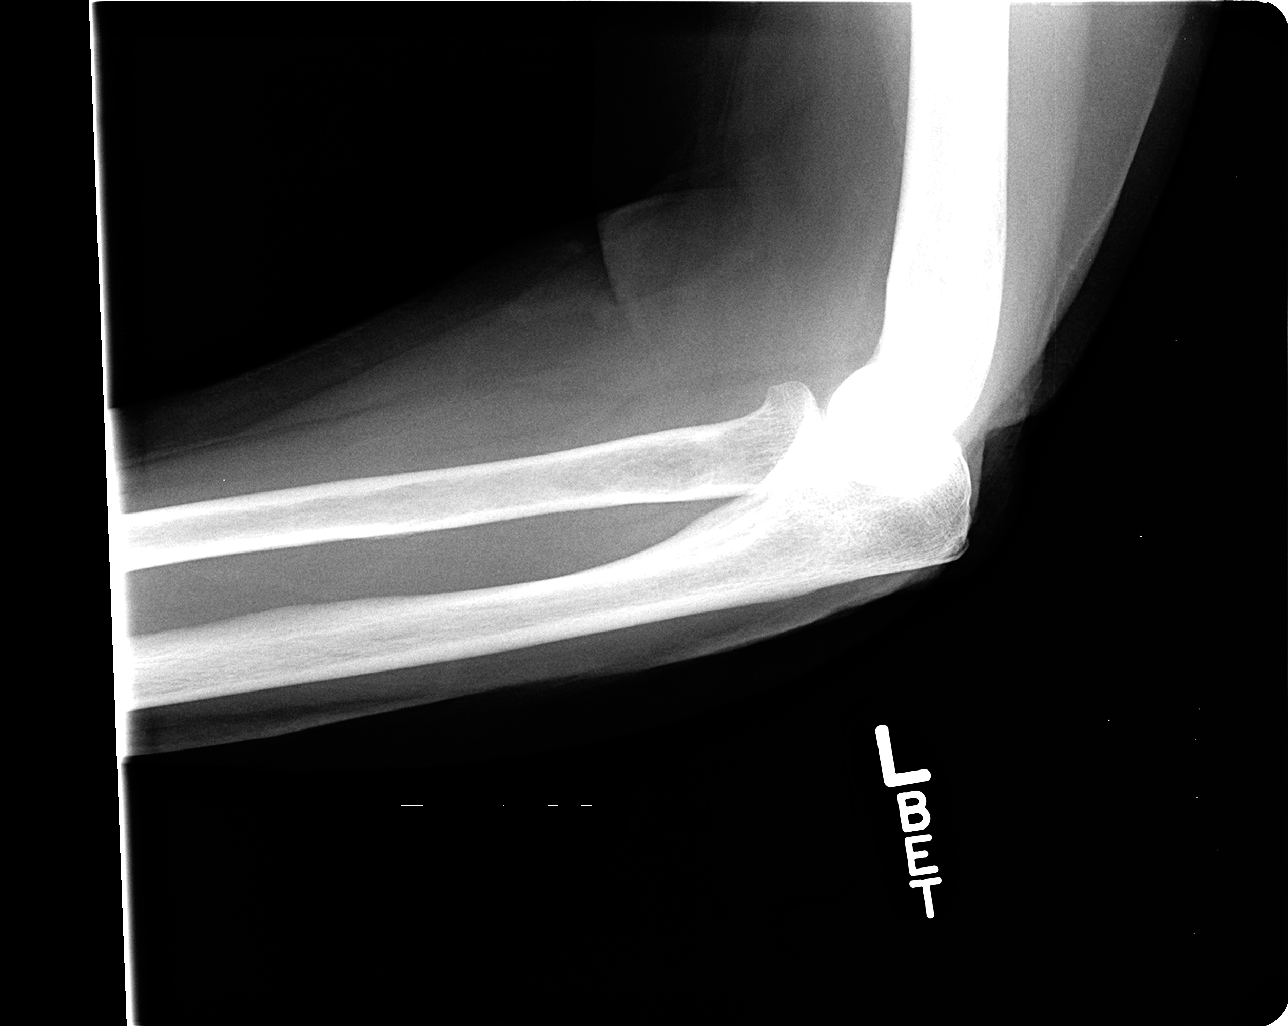

[4 of 4 positions shown; findings below may reference images not displayed]

FINDINGS: No acute fracture.  No dislocation.  Unremarkable soft
tissues.
IMPRESSION: No acute bony pathology.

## 2010-04-03 IMAGING — CR DG WRIST COMPLETE 3+V*L*
3 series · 3 of 3 positions shown · non-contrast
Comparison: None.

CLINICAL DATA: Fall.  Pain.

LEFT WRIST - COMPLETE 3+ VIEW

[view not recorded (1 of 3)]
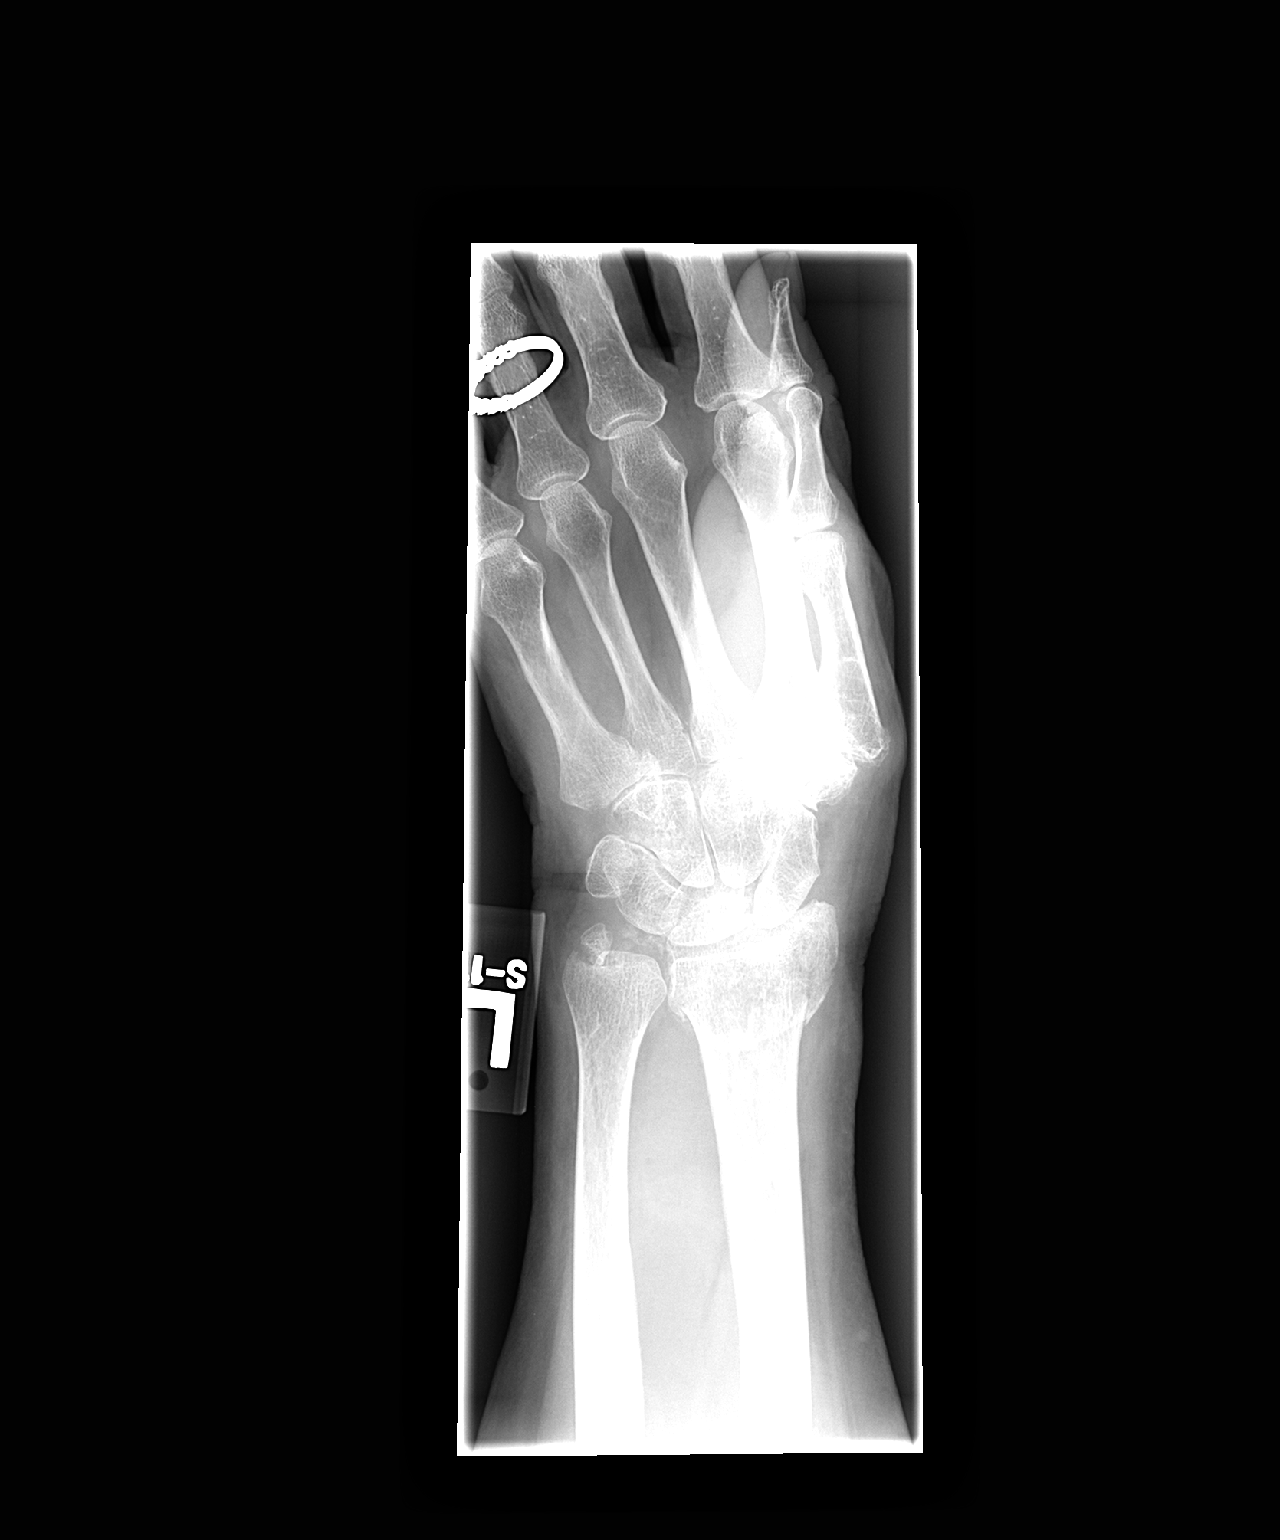

[view not recorded (2 of 3)]
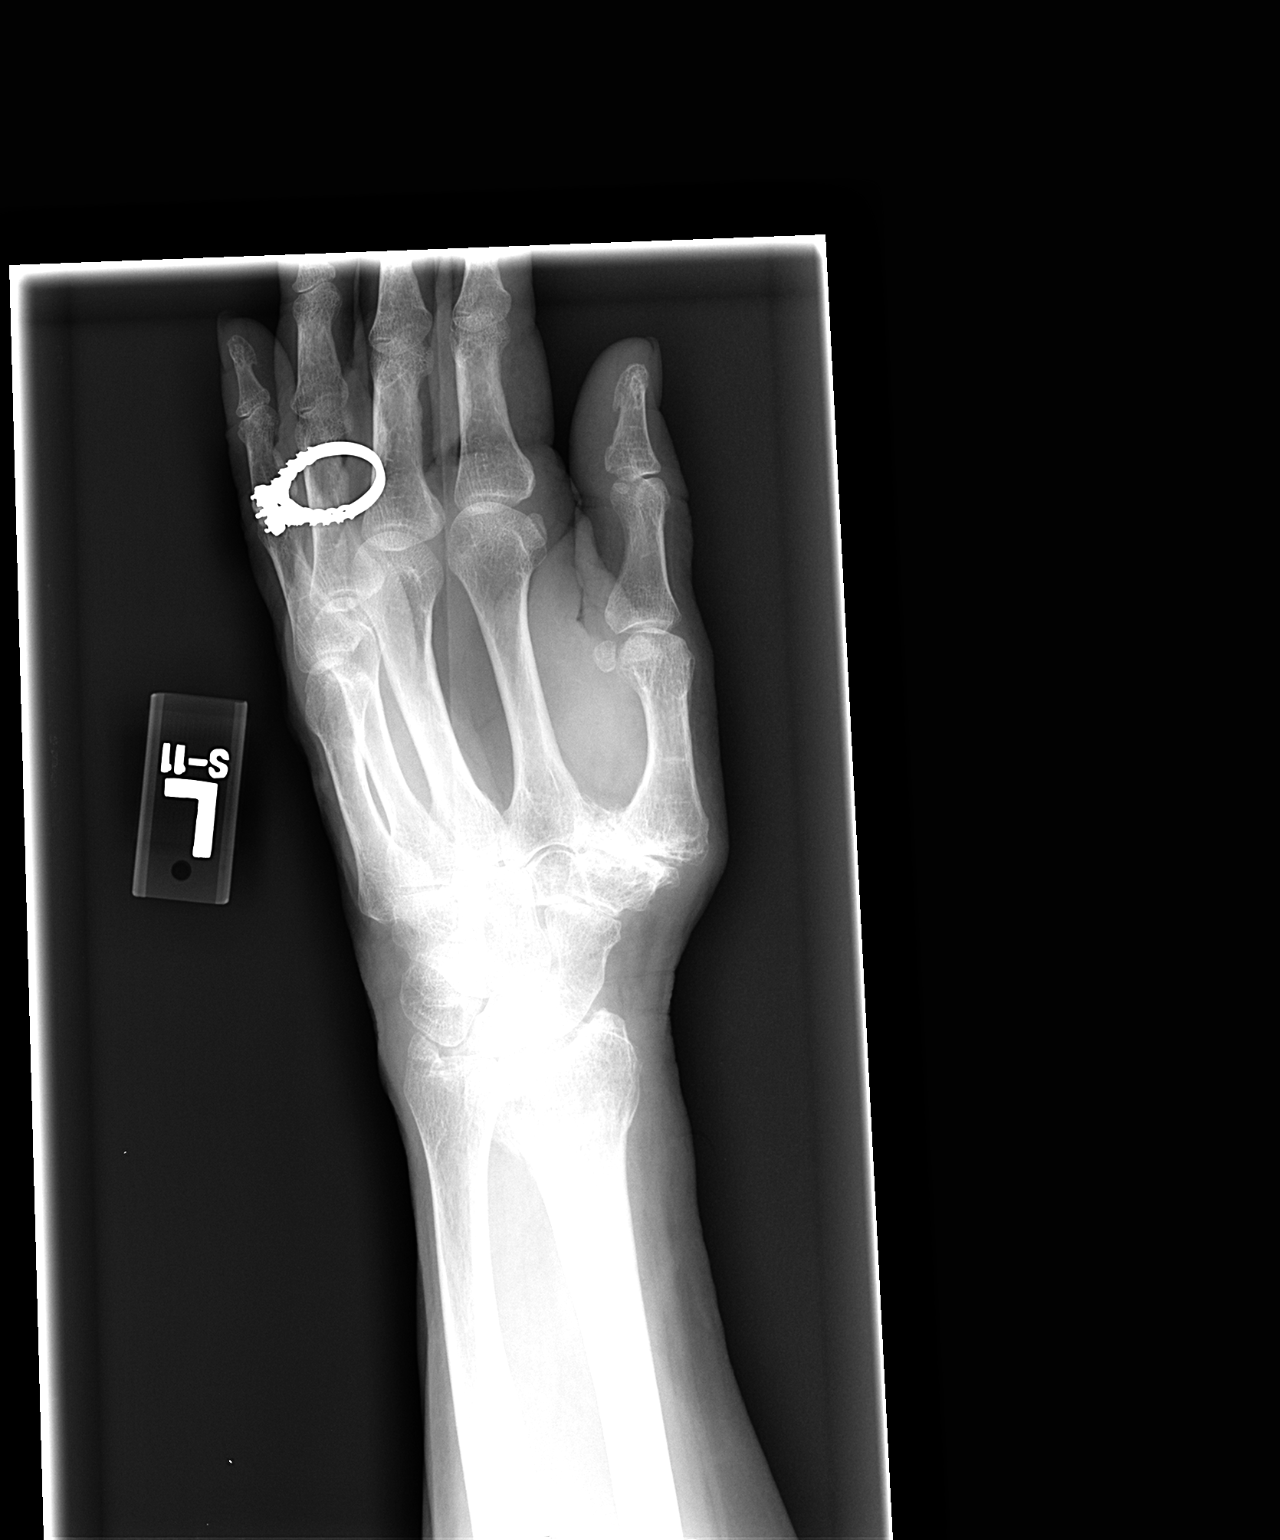

[view not recorded (3 of 3)]
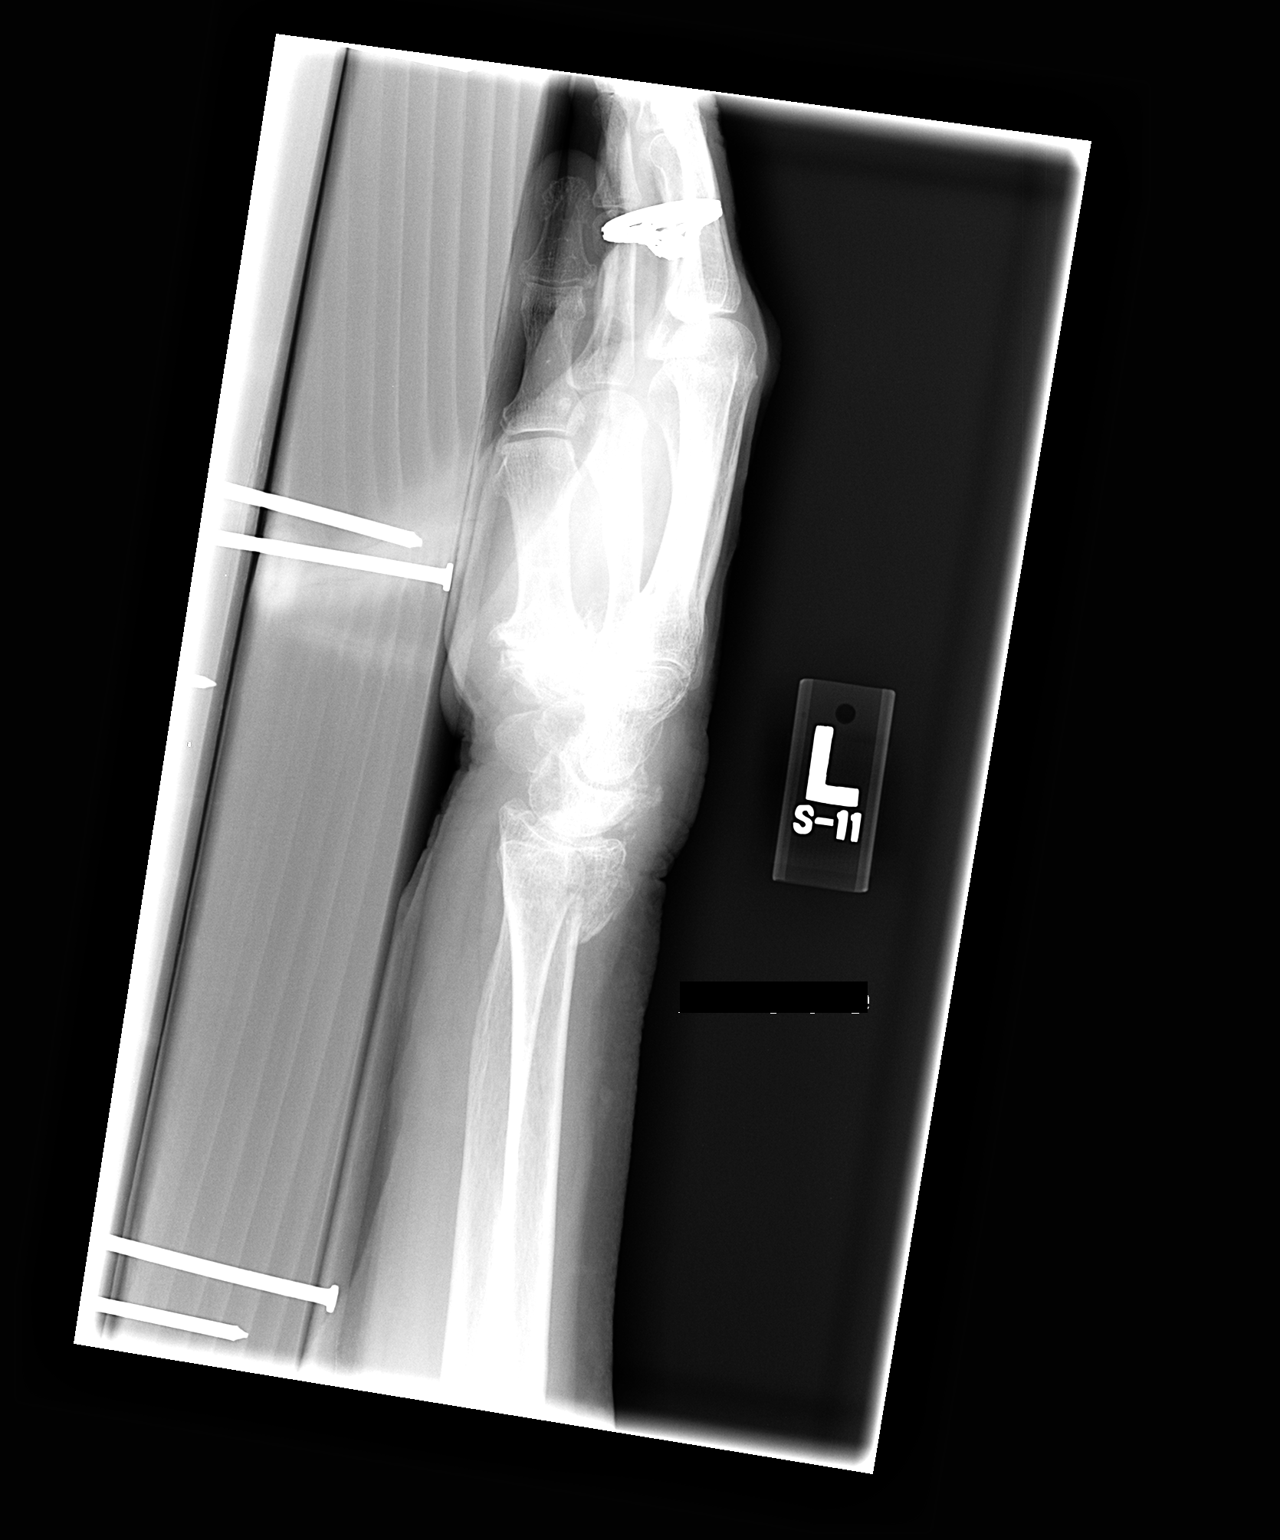

[3 of 3 positions shown; findings below may reference images not displayed]

FINDINGS: Displaced fracture of the ulnar styloid process.
Displaced angulated fracture of the distal radial metaphysis.

Calcification of the triangular fibrocartilage.  Incidental
degenerative arthrosis of the first carpometacarpal articulation.
IMPRESSION: Fracture of the distal left radius and ulnar styloid process.

## 2010-04-04 ENCOUNTER — Ambulatory Visit: Payer: Self-pay | Admitting: Orthopedic Surgery

## 2010-04-04 DIAGNOSIS — S52539A Colles' fracture of unspecified radius, initial encounter for closed fracture: Secondary | ICD-10-CM | POA: Insufficient documentation

## 2010-04-09 ENCOUNTER — Telehealth: Payer: Self-pay | Admitting: Orthopedic Surgery

## 2010-04-09 ENCOUNTER — Ambulatory Visit: Payer: Self-pay | Admitting: Orthopedic Surgery

## 2010-04-11 ENCOUNTER — Ambulatory Visit: Payer: Self-pay | Admitting: Orthopedic Surgery

## 2010-04-13 ENCOUNTER — Telehealth: Payer: Self-pay | Admitting: Orthopedic Surgery

## 2010-04-16 ENCOUNTER — Telehealth: Payer: Self-pay | Admitting: Orthopedic Surgery

## 2010-04-18 ENCOUNTER — Ambulatory Visit: Payer: Self-pay | Admitting: Orthopedic Surgery

## 2010-05-02 ENCOUNTER — Ambulatory Visit: Payer: Self-pay | Admitting: Orthopedic Surgery

## 2010-05-10 ENCOUNTER — Telehealth: Payer: Self-pay | Admitting: Orthopedic Surgery

## 2010-05-10 ENCOUNTER — Encounter: Payer: Self-pay | Admitting: Orthopedic Surgery

## 2010-05-10 ENCOUNTER — Emergency Department (HOSPITAL_COMMUNITY): Admission: EM | Admit: 2010-05-10 | Discharge: 2010-05-10 | Payer: Self-pay | Admitting: Emergency Medicine

## 2010-05-10 IMAGING — CR DG SACRUM/COCCYX 2+V
3 series · 3 of 3 positions shown · non-contrast
Comparison: None.

CLINICAL DATA: Right ankle injury, fall

SACRUM AND COCCYX - 2+ VIEW

[view not recorded (1 of 3)]
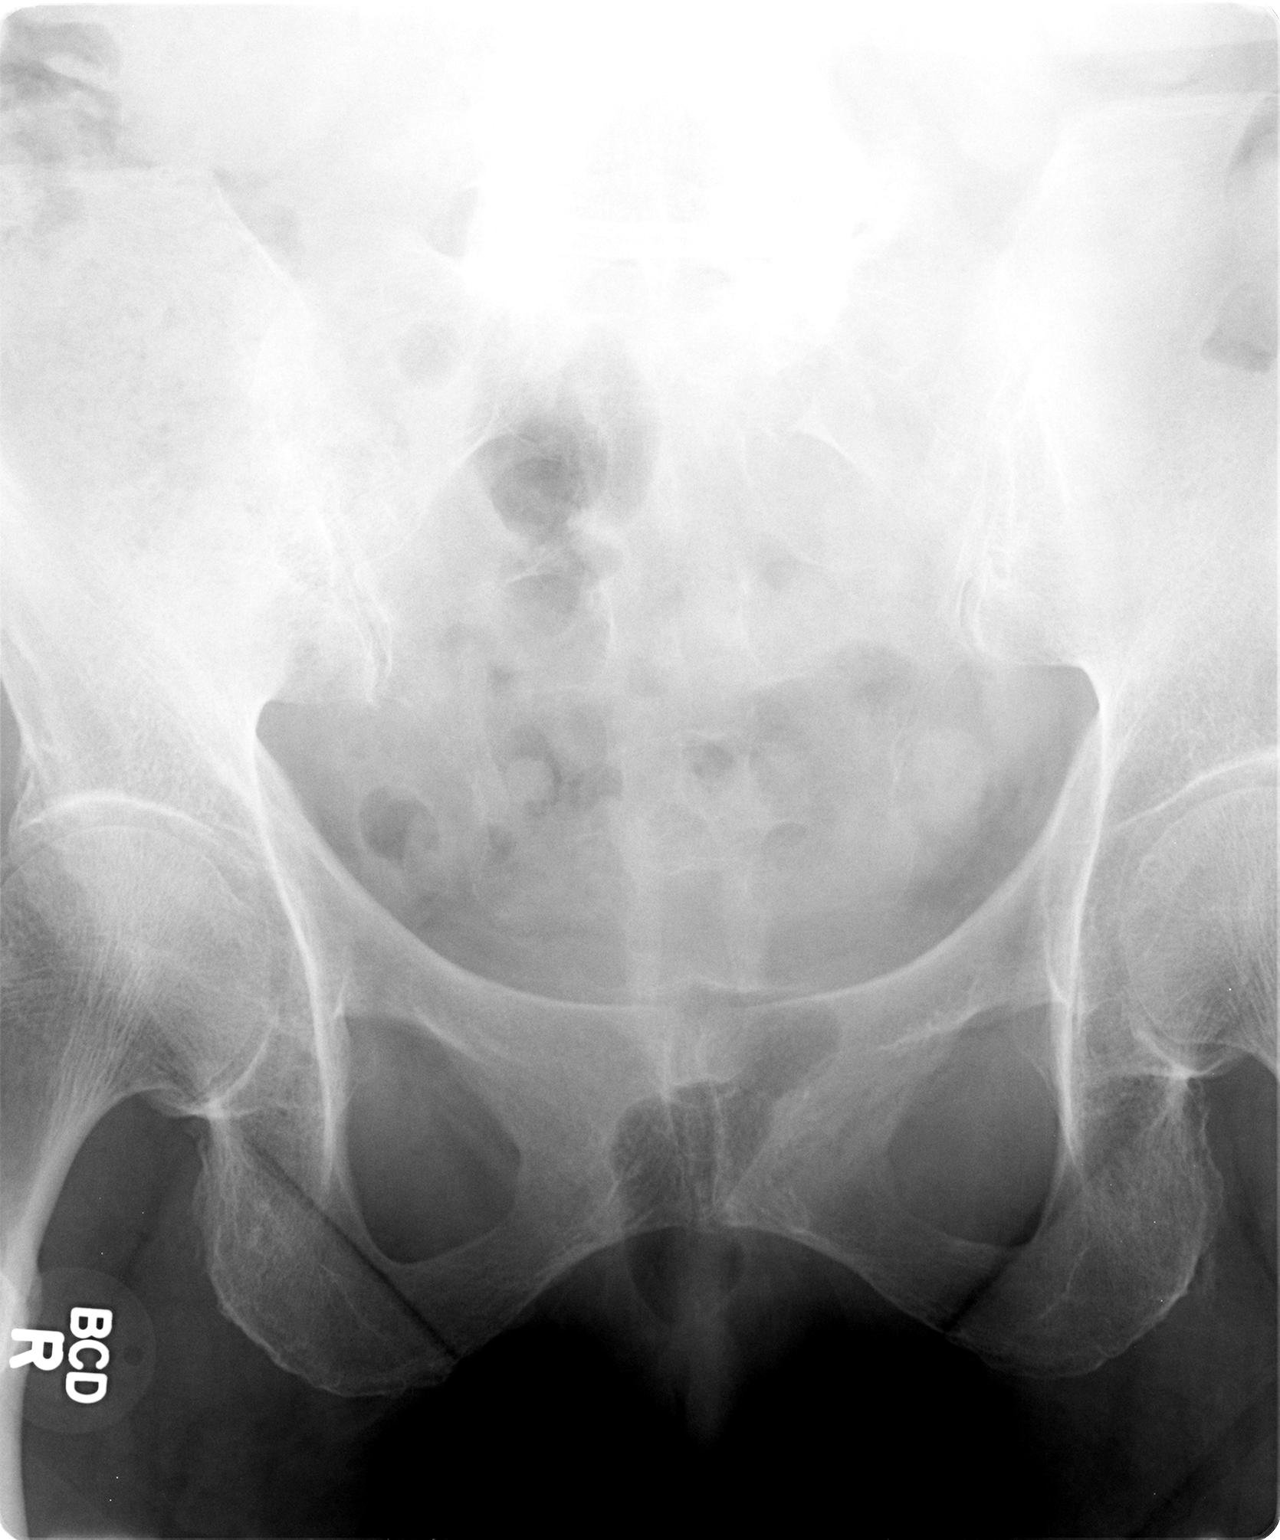

[view not recorded (2 of 3)]
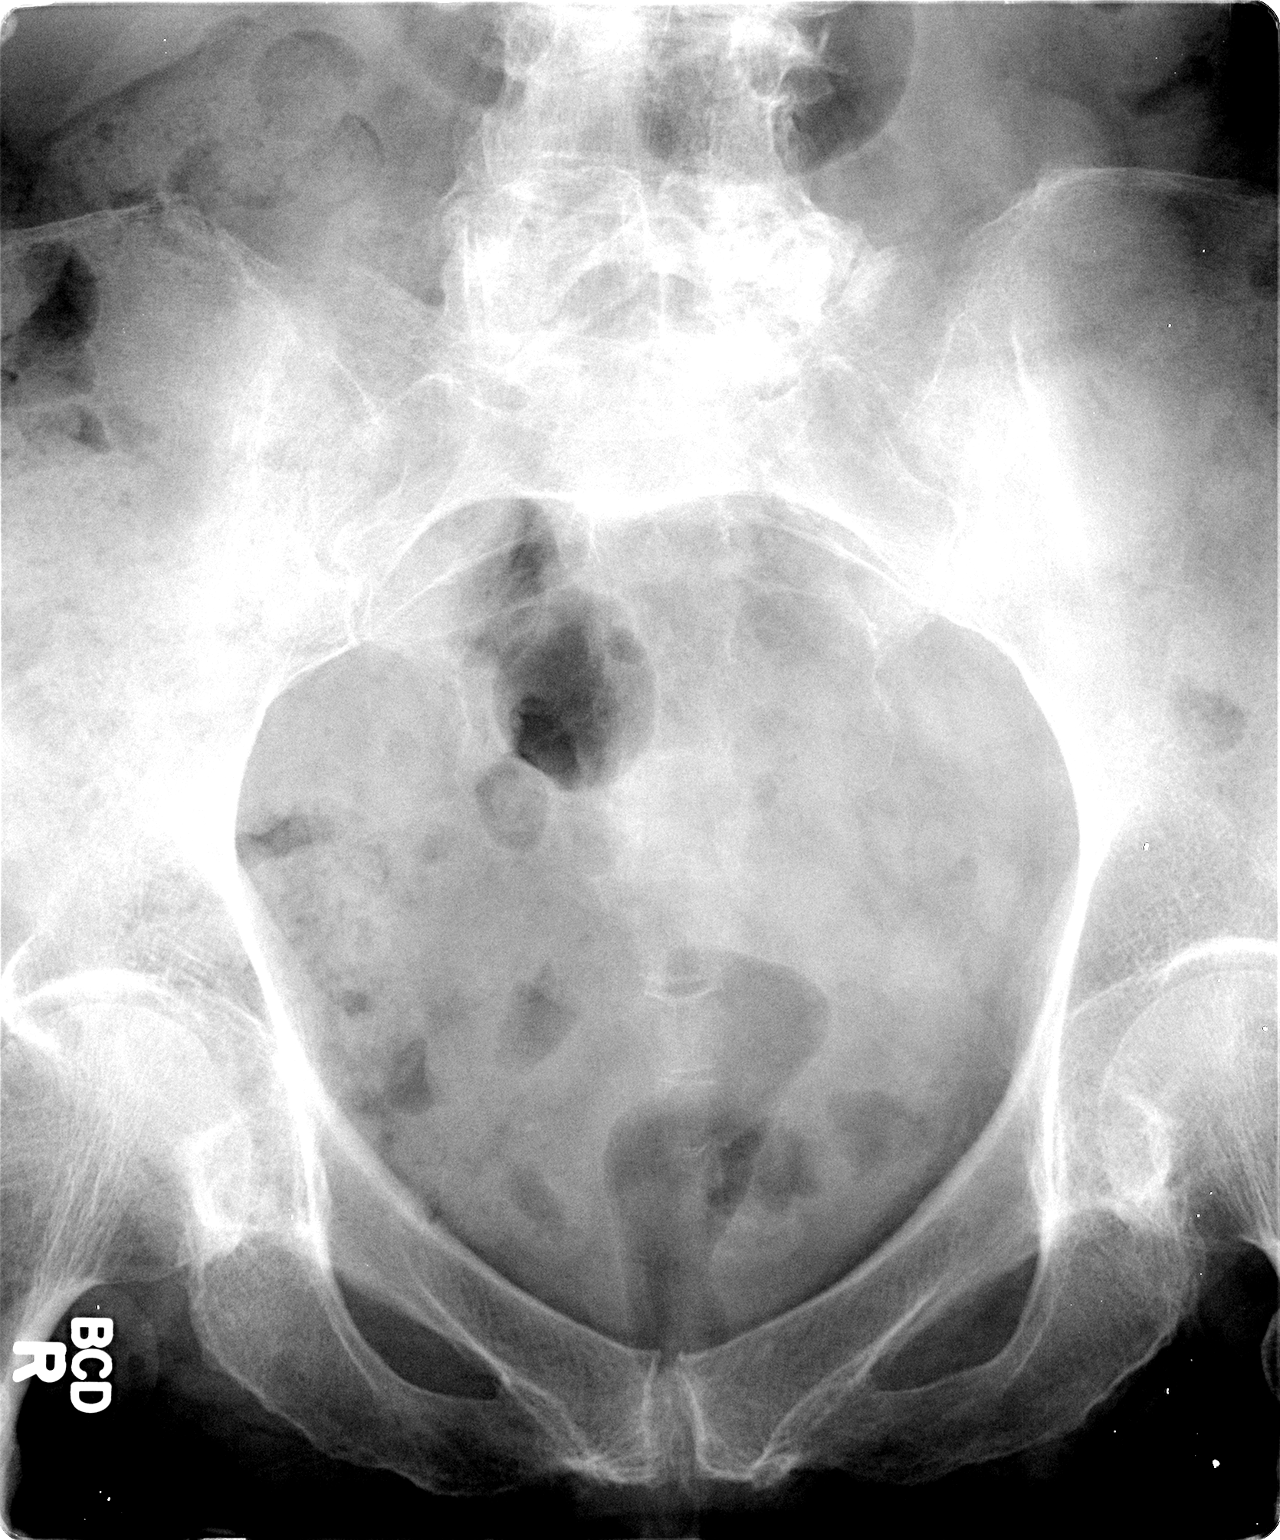

[view not recorded (3 of 3)]
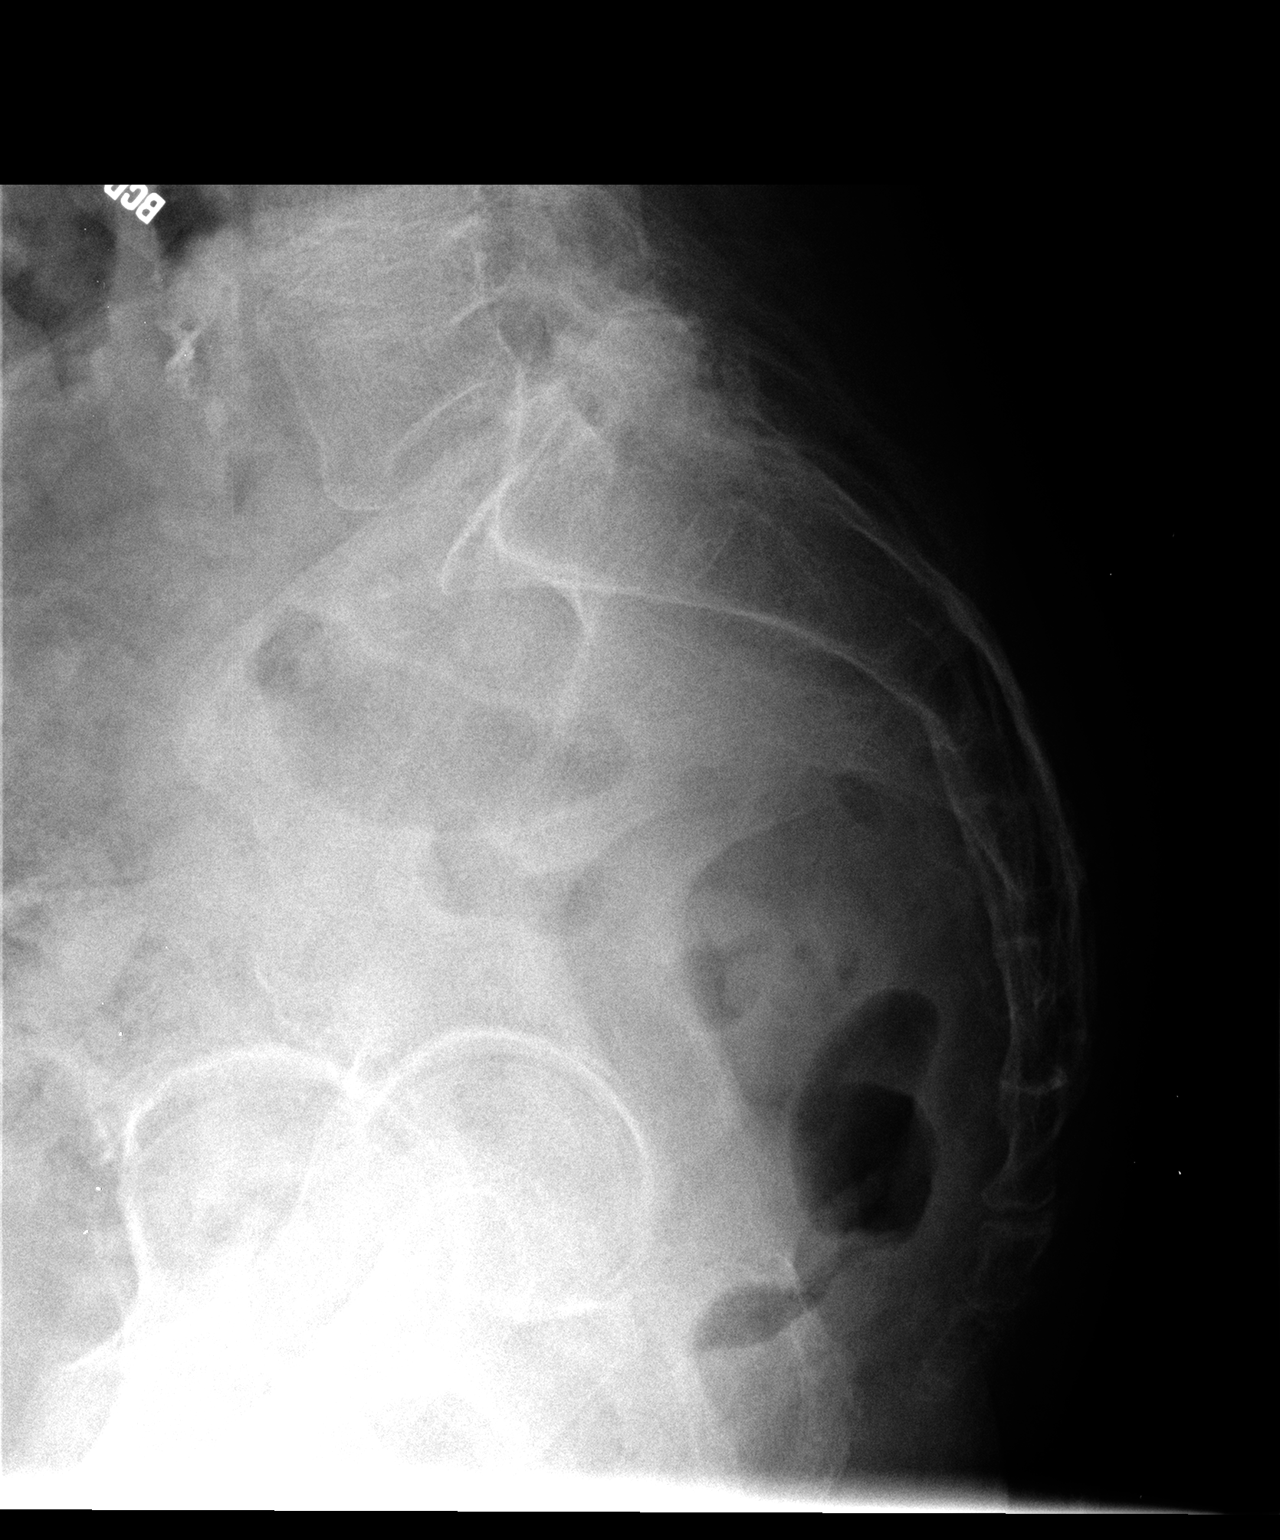

[3 of 3 positions shown; findings below may reference images not displayed]

FINDINGS: Hips are located.  No evidence of sacral fracture.  No
evidence of coccygeal fracture
IMPRESSION: No  evidence of coccygeal or sacral fracture.

## 2010-05-10 IMAGING — CR DG ANKLE COMPLETE 3+V*R*
3 series · 3 of 3 positions shown · non-contrast
Comparison: None.

CLINICAL DATA: Fell from chair, twisted ankle

RIGHT ANKLE - COMPLETE 3+ VIEW

[view not recorded (1 of 3)]
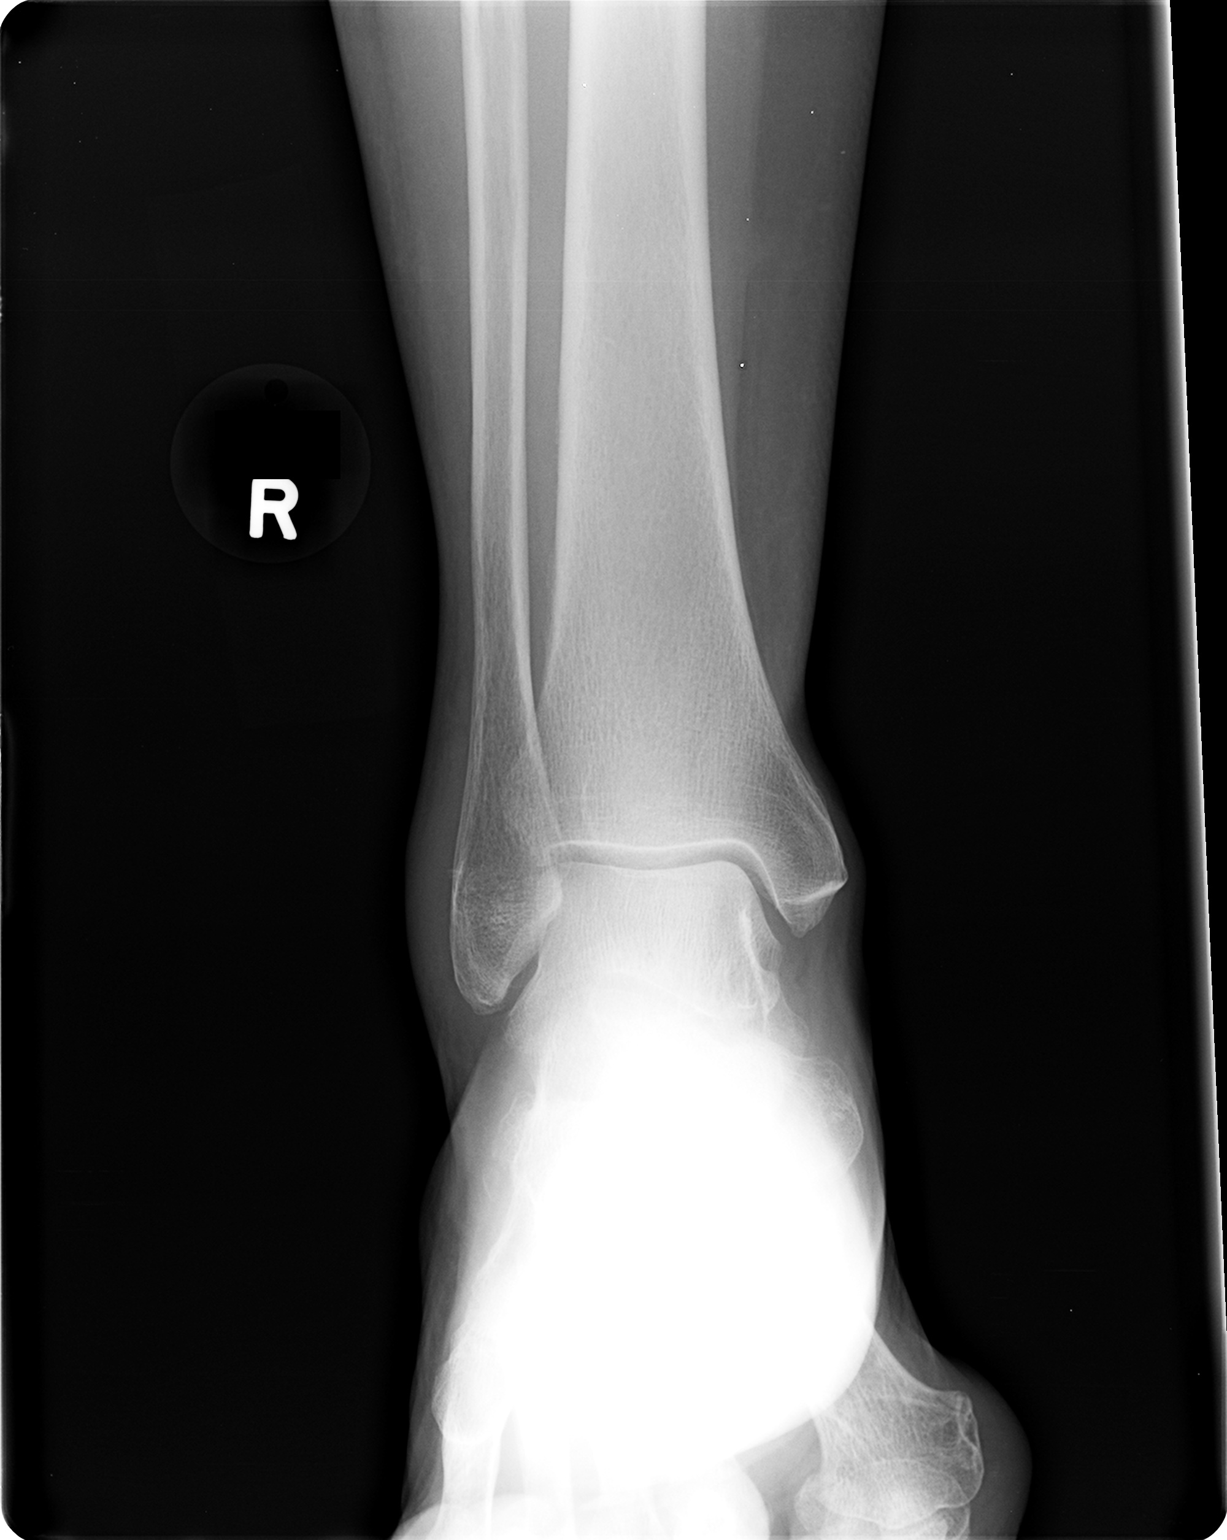

[view not recorded (2 of 3)]
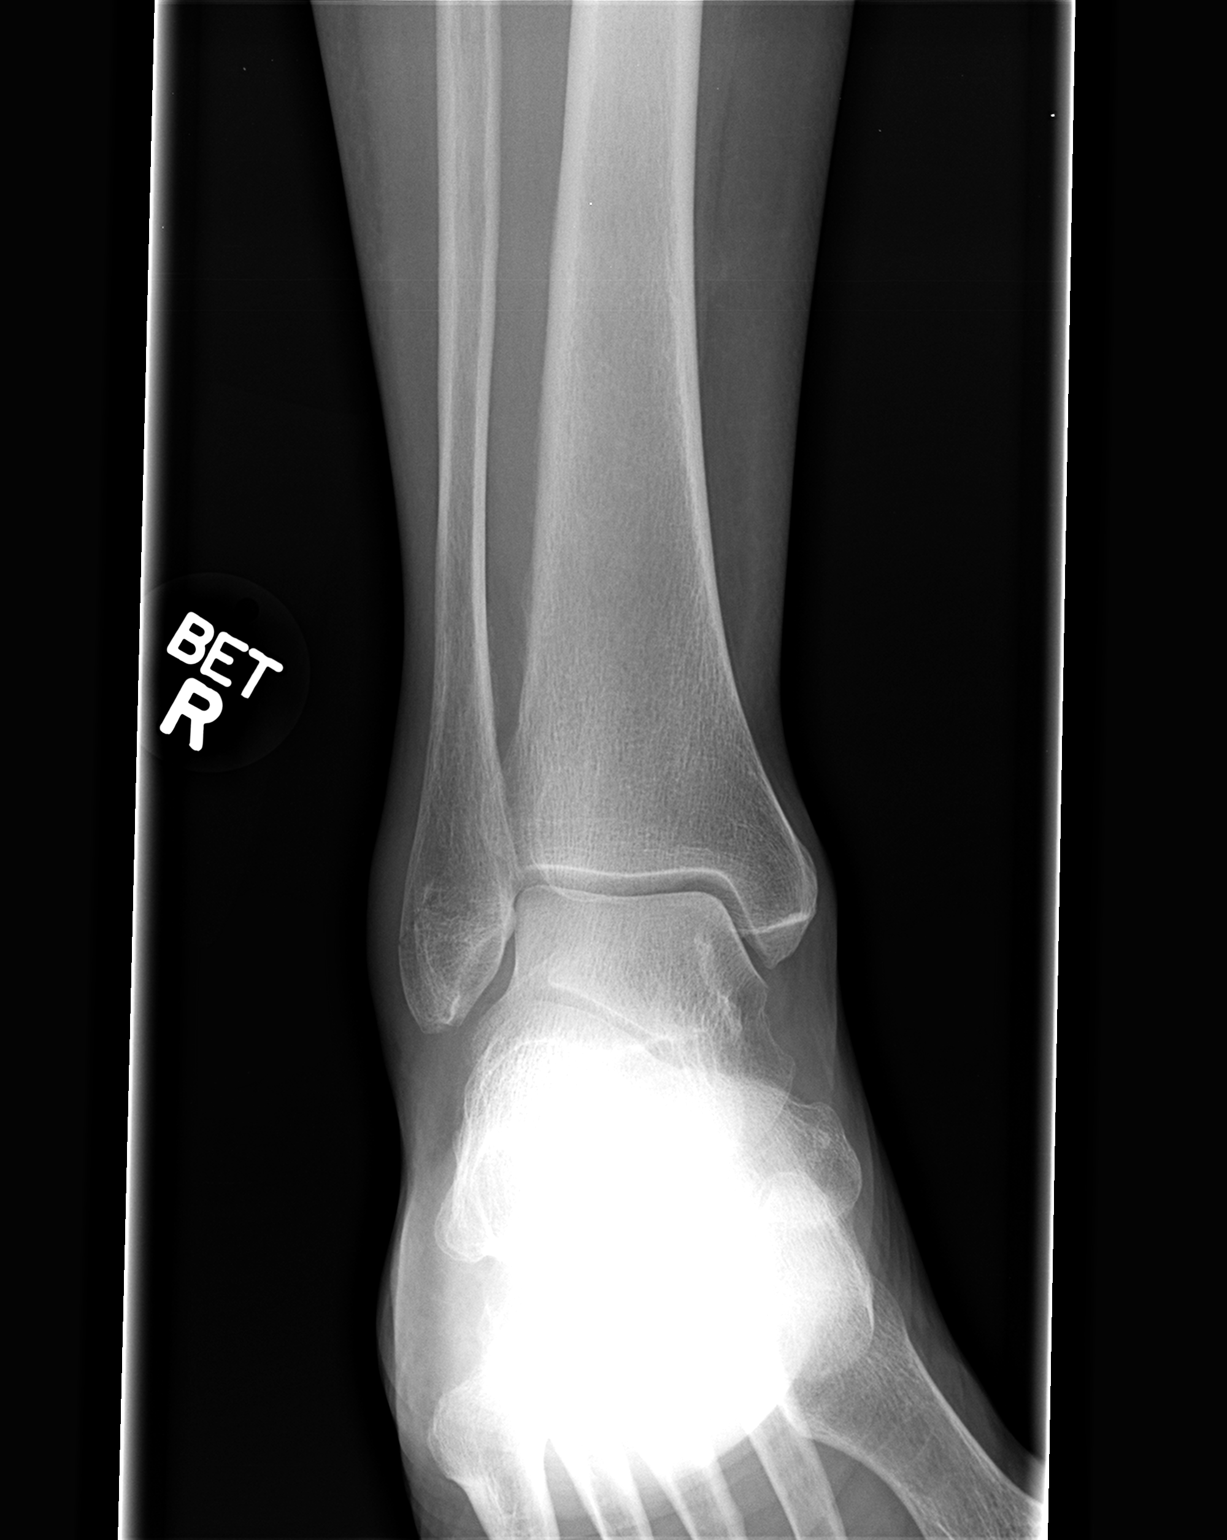

[view not recorded (3 of 3)]
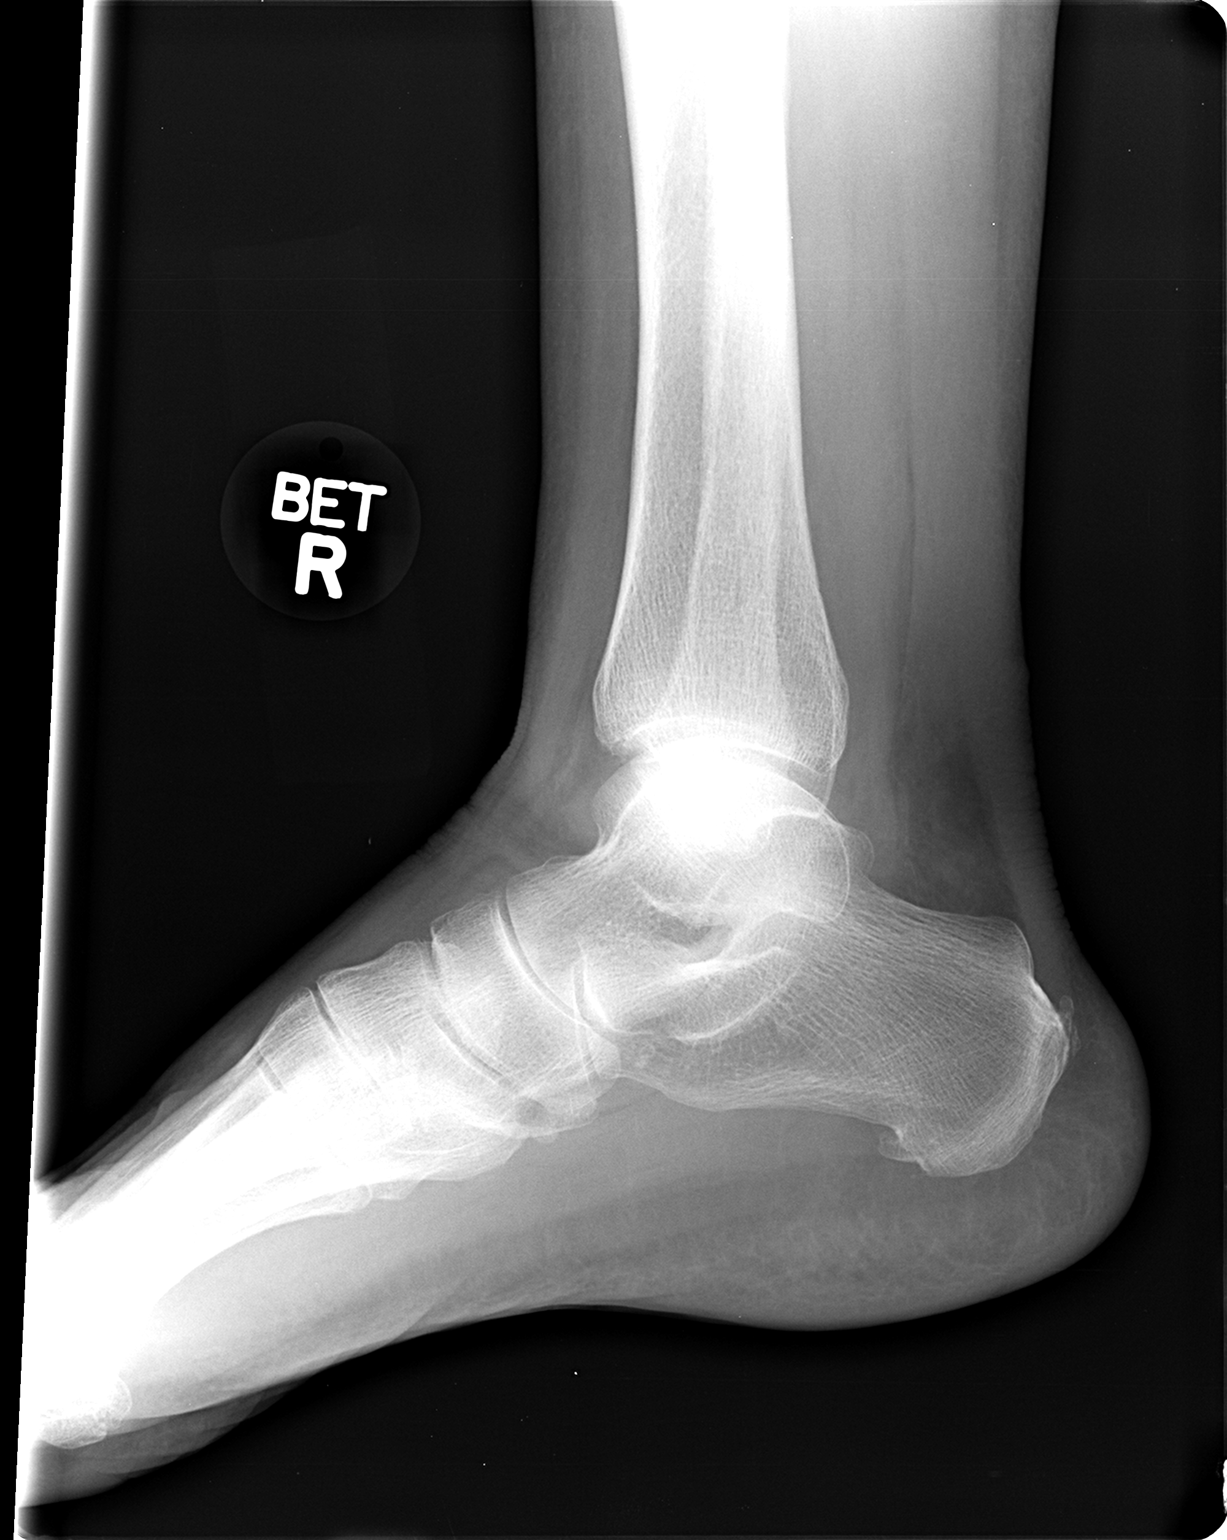

[3 of 3 positions shown; findings below may reference images not displayed]

FINDINGS: Ankle mortise intact.  The talar dome is normal.  No
evidence of malleolar fracture.  No joint effusion.
IMPRESSION: No evidence of ankle fracture.

## 2010-05-14 ENCOUNTER — Telehealth: Payer: Self-pay | Admitting: Orthopedic Surgery

## 2010-05-21 ENCOUNTER — Ambulatory Visit: Payer: Self-pay | Admitting: Orthopedic Surgery

## 2010-05-22 ENCOUNTER — Telehealth: Payer: Self-pay | Admitting: Orthopedic Surgery

## 2010-05-23 ENCOUNTER — Telehealth: Payer: Self-pay | Admitting: Orthopedic Surgery

## 2010-05-25 ENCOUNTER — Encounter: Payer: Self-pay | Admitting: Orthopedic Surgery

## 2010-06-26 ENCOUNTER — Encounter (HOSPITAL_COMMUNITY): Admission: RE | Admit: 2010-06-26 | Discharge: 2010-07-26 | Payer: Self-pay | Admitting: Orthopedic Surgery

## 2010-07-27 ENCOUNTER — Encounter (HOSPITAL_COMMUNITY): Admission: RE | Admit: 2010-07-27 | Discharge: 2010-08-24 | Payer: Self-pay | Admitting: Orthopedic Surgery

## 2010-08-07 ENCOUNTER — Emergency Department (HOSPITAL_COMMUNITY): Admission: EM | Admit: 2010-08-07 | Discharge: 2010-08-07 | Payer: Self-pay | Admitting: Emergency Medicine

## 2010-08-07 IMAGING — CR DG CHEST 2V
2 series · 2 of 2 positions shown · non-contrast
Comparison: [DATE]

CLINICAL DATA: Short of breath

CHEST - 2 VIEW

[w chest pa]
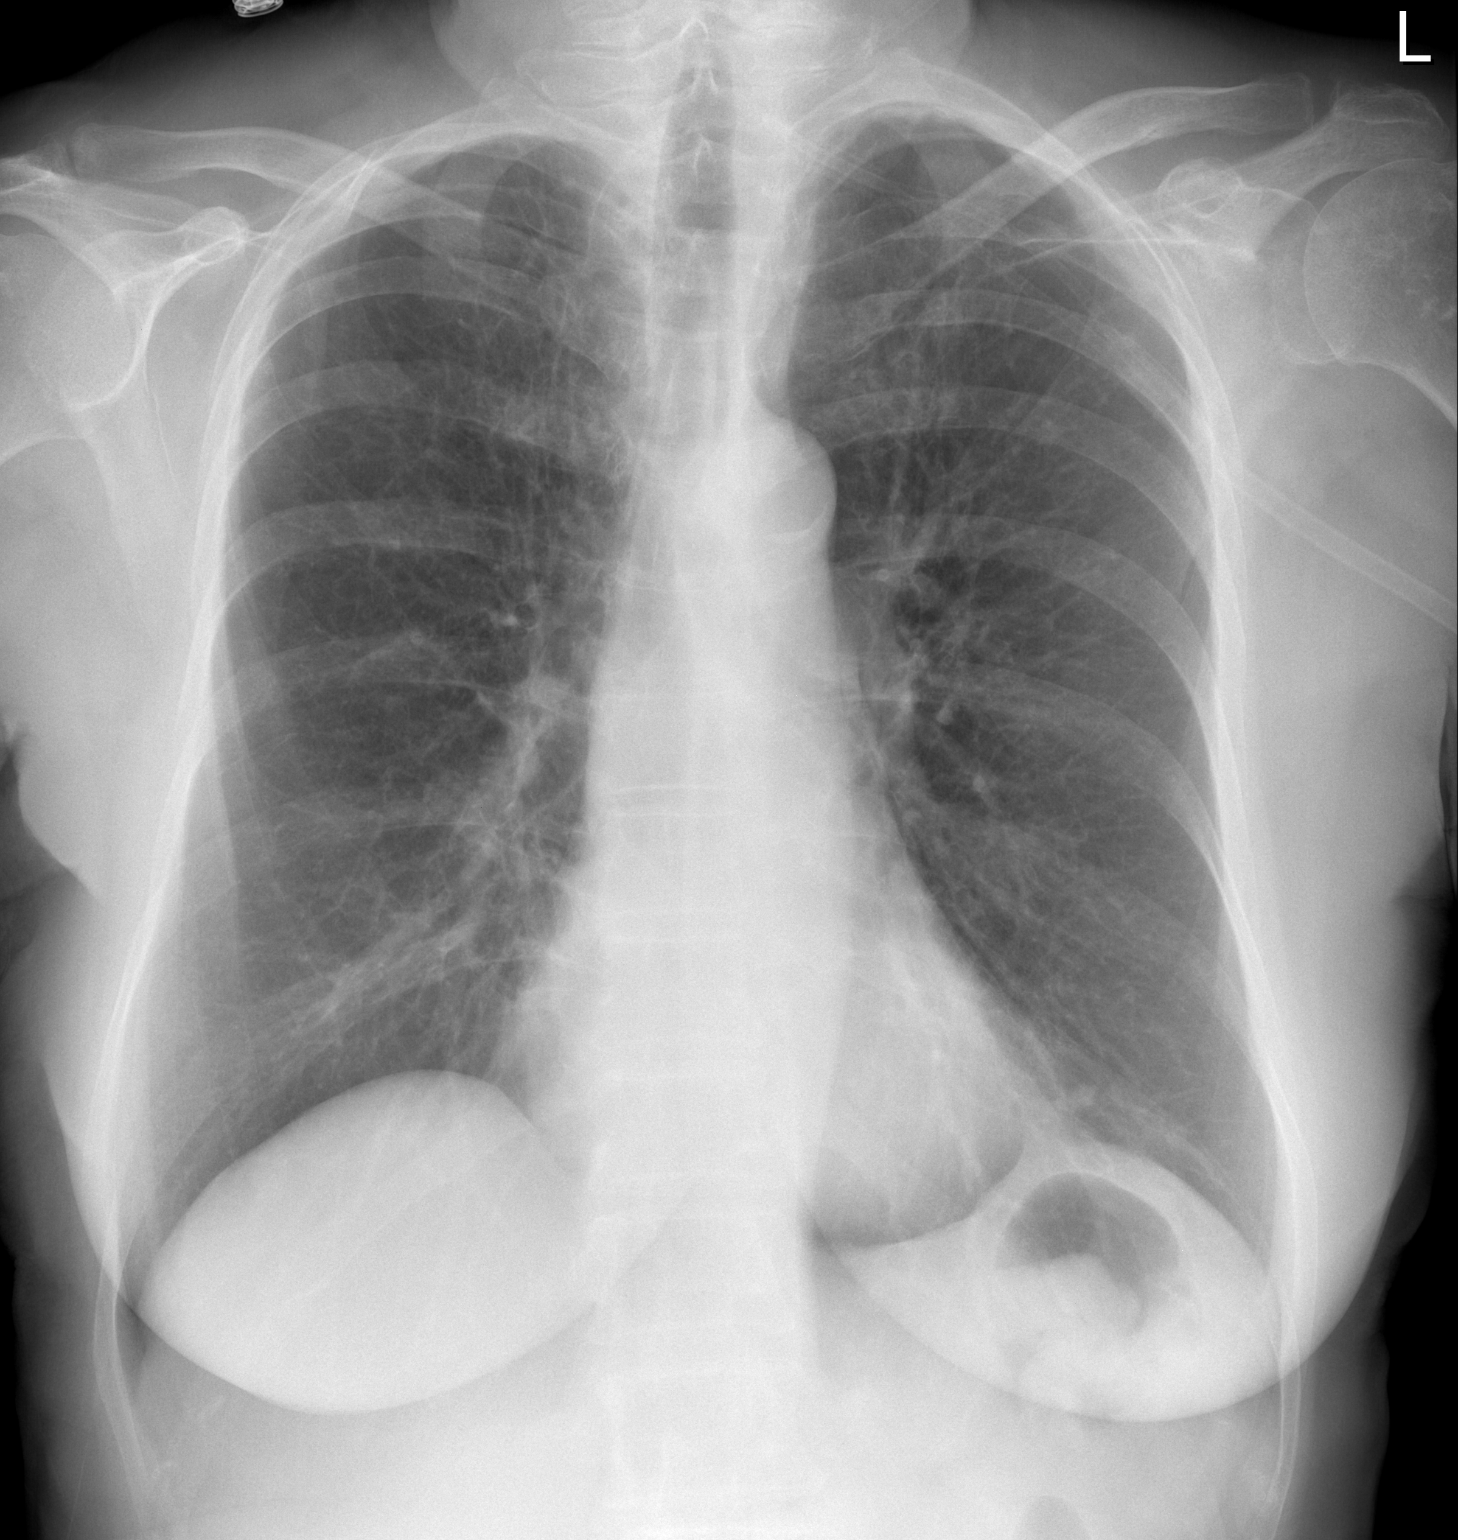

[w chest lat]
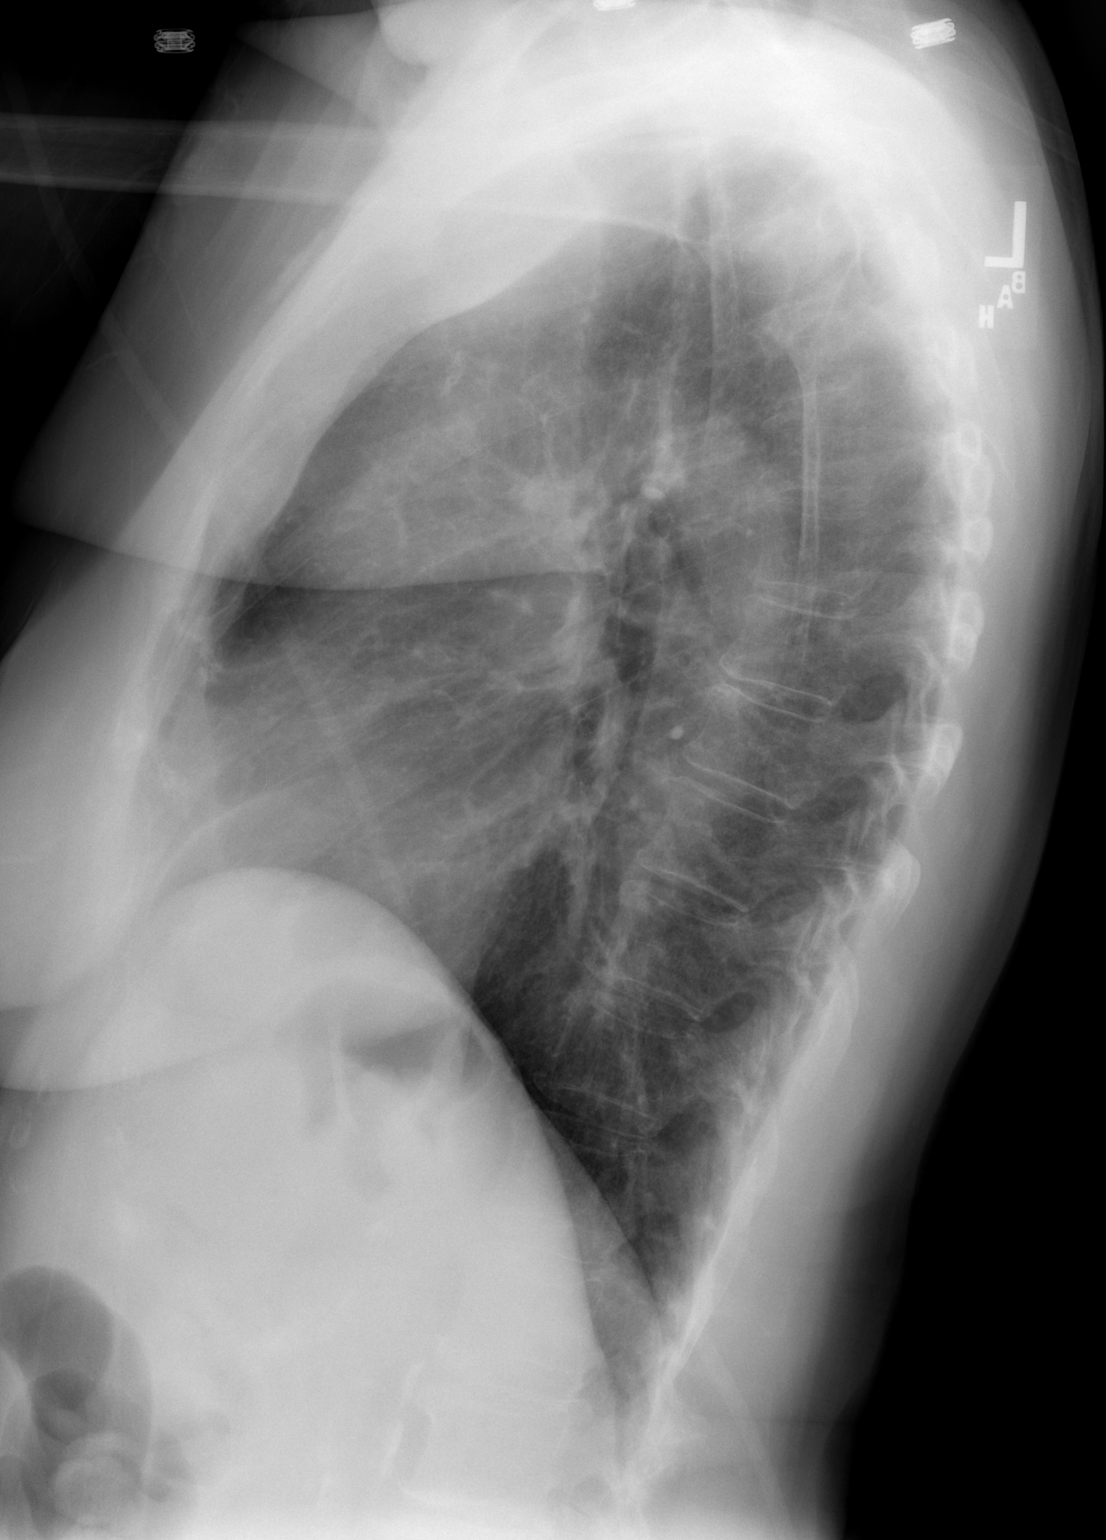

[2 of 2 positions shown; findings below may reference images not displayed]

FINDINGS: Lungs remain hyperaerated.  Heart remains borderline
enlarged.  Stable scarring at left lung base.  Remaining lung zones
are clear.  No pneumothorax and no pleural effusion.
IMPRESSION: No active cardiopulmonary disease.  Chronic changes.

## 2010-08-27 ENCOUNTER — Encounter (HOSPITAL_COMMUNITY): Admission: RE | Admit: 2010-08-27 | Discharge: 2010-09-26 | Payer: Self-pay | Admitting: Orthopedic Surgery

## 2010-10-25 ENCOUNTER — Encounter: Payer: Self-pay | Admitting: Gastroenterology

## 2010-11-29 ENCOUNTER — Encounter: Payer: Self-pay | Admitting: Gastroenterology

## 2010-12-11 ENCOUNTER — Encounter: Payer: Self-pay | Admitting: Internal Medicine

## 2010-12-16 ENCOUNTER — Encounter: Payer: Self-pay | Admitting: Internal Medicine

## 2010-12-20 ENCOUNTER — Ambulatory Visit
Admission: RE | Admit: 2010-12-20 | Discharge: 2010-12-20 | Payer: Self-pay | Source: Home / Self Care | Attending: Gastroenterology | Admitting: Gastroenterology

## 2010-12-27 NOTE — Assessment & Plan Note (Signed)
Summary: BOTTOM HURTING/AMS   Visit Type:  f/u Primary Care Provider:  Ouida Sills  Chief Complaint:  proctits.  History of Present Illness: Miss Kara Woods is here for a followup visit. She has a history of idiopathic proctitis. She was last seen in July 2009. She states she is having a flareup about 3-4 times per year. She has not been taking her Canasa suppositories regularly as directed (2 times per week). She states that she stopped taking it regularly when her prescription was filled for only #8 instead of #30 like she used to get. She had a recent flare that she took about 7 days worse with Canasa, one suppository at bedtime. She complained of tenesmus, single episode of rectal bleeding, rectal pain. Her symptoms have been settled down for several days now. She denies any diarrhea. Denies abdominal pain. Her appetite is good. 3 days ago she developed pain in between her shoulder blades which radiates into her chest. She denies having any injury. Her symptoms are worse when she moves her left shoulder. She's not sure that it is affected by meals. She has an appointment with Dr. Ouida Sills tomorrow. She is concerned that it may be her gallbladder.     Current Medications (verified): 1)  Tranxene-T 7.5 Mg Tabs (Clorazepate Dipotassium) .... Once Daily 2)  Advil 200 Mg Tabs (Ibuprofen) .... Prn 3)  Tylenol 325 Mg Tabs (Acetaminophen) .... As Needed 4)  Canasa 1000 Mg Supp (Mesalamine) .... Per Rectum As Needed 5)  Pantoprazole Sodium 40 Mg Tbec (Pantoprazole Sodium) .... One Daily As Needed  Allergies (verified): 1)  ! Sulfa  Review of Systems      See HPI CV:  Complains of chest pains; denies angina, palpitations, dyspnea on exertion, and peripheral edema. Resp:  Denies dyspnea at rest, dyspnea with exercise, cough, and wheezing. GI:  See HPI; Denies difficulty swallowing, pain on swallowing, nausea, indigestion/heartburn, vomiting, abdominal pain, jaundice, gas/bloating, constipation, and  black BMs. GU:  Denies urinary burning and blood in urine.  Vital Signs:  Patient profile:   77 year old female Height:      67 inches Weight:      162 pounds BMI:     25.46 Temp:     98.5 degrees F oral Pulse rate:   80 / minute BP sitting:   110 / 62  (left arm) Cuff size:   regular  Vitals Entered By: Cloria Spring LPN (September 18, 2009 11:29 AM)  Physical Exam  General:  Well developed, well nourished, no acute distress. Head:  Normocephalic and atraumatic. Eyes:  Conjunctivae pink, no scleral icterus.  Mouth:  OP moist. Neck:  Supple; no masses or thyromegaly. Chest Wall:  No tenderness with palpation.  Some pain with movement of left shoulder. Lungs:  Clear throughout to auscultation. Heart:  Regular rate and rhythm; no murmurs, rubs,  or bruits. Abdomen:  Bowel sounds normal.  Abdomen is soft, nontender, nondistended.  No rebound or guarding.  No hepatosplenomegaly, masses or hernias.  No abdominal bruits.  Extremities:  No clubbing, cyanosis, edema or deformities noted. Neurologic:  Alert and  oriented x4;  grossly normal neurologically. Skin:  Intact without significant lesions or rashes. Psych:  Alert and cooperative. Normal mood and affect.  Impression & Recommendations:  Problem # 1:  PROCTITIS (UYQ-034.74) idiopathic proctitis with recent flare, likely due to noncompliance with medication. Clinically she is back in remission after short course of Canasa. She will need to stay on Canasa at least 2 times per  week but may take up to one per rectum q.h.s. during flares. Prescription provided as well as #10 samples. Currently she is having some difficulty with constipation, advised MiraLax 17 g daily as needed. Office visit in one year. Orders: Est. Patient Level III (63875)  Problem # 2:  CHEST PAIN UNSPECIFIED (ICD-786.50) acute pain from left scapular region radiating to the center of her chest. Not necessarily related to meals. No known trauma or injury. Denies any  associated shortness of breath, diaphoresis, radiation down the left arm. Somewhat aggravated by movement. Suspect musculoskeletal source. She plans to follow up with Dr. Ouida Sills tomorrow. I've asked her to watch for symptoms suggestive of GERD or gallbladder disease. She was advised to take pantoprazole every day for the next 4-6 weeks to see if this helps. Orders: Est. Patient Level III (64332) Prescriptions: CANASA 1000 MG SUPP (MESALAMINE) one pr at bedtime as directed  #30 x 11   Entered and Authorized by:   Leanna Battles. Dixon Boos   Signed by:   Leanna Battles Dixon Boos on 09/18/2009   Method used:   Electronically to        [facility] (retail)       [address]       [place], [place]  [address]       Ph: [phone] or [phone]       Fax: [phone]   RxID:   [id number]

## 2010-12-27 NOTE — Assessment & Plan Note (Signed)
Summary: 1 WK RE-CK/XRAY LT ARM/FX CARE/SEC HORIZ/CAF   Visit Type:  Follow-up Referring Provider:  ap er Primary Provider:  Ouida Sills  CC:  left wrist fracture.Marland Kitchen  History of Present Illness: I saw Kara Woods in the office today for a 1 week  followup visit.  She is a 77 years old woman with the complaint of:  left wrist fracture.  Xrays today.  Fell 04/03/10, slipped on water.  Xrays left wrist and elbow on [facility] 04/03/10.  Meds: Percocet  15 DAYS POST INJURY   Allergies: 1)  ! Sulfa  Physical Exam  Skin:  intact without lesions or rashes Psych:  alert and cooperative; normal mood and affect; normal attention span and concentration   Wrist/Hand Exam  Wrist Exam:    Left:    Palpation:  Abnormal    Stability:  stable    Tenderness:  DISTAL RADIUS    Impression & Recommendations:  Problem # 1:  COLLES' FRACTURE, LEFT (ICD-813.41) Assessment Comment Only  Orders: Post-Op Check (91478) Wrist x-ray complete, minimum 3 views (73110) FRACTURE DISTAL RADIUS  FRACTURE HAS SLIPPED A LITTLE  20 DORSAL ANGULATION 3 MM SHORTENING   Patient Instructions: 1)  Please schedule a follow-up appointment in 2 weeks. 2)  XRAYS IN PLASTER

## 2010-12-27 NOTE — Assessment & Plan Note (Signed)
Summary: 1 WK RE-CK/XRAY LT ARM/FX CARE/SEC HORIZ/CAF   Visit Type:  Follow-up Referring Provider:  ap er Primary Provider:  Ouida Sills  CC:  left wrist fracture.  History of Present Illness: I saw Kara Woods in the office today for a 1 week  followup visit.  She is a 77 years old woman with the complaint of:  left wrist fracture.  Xrays today.  Fell 04/03/10, slipped on water.  Xrays left wrist and elbow on [facility] 04/03/10.  Meds: Promethazine Hcl 25 Mg Tabs (Promethazine hcl) .... One by mouth q 6 hrs as needed nausea   Norco 5-325 Mg Tabs (Hydrocodone-acetaminophen) .Marland Kitchen.. 1-2 by mouth q 4 as needed pain  x-rays today show that the fracture has maintained its position with slight dorsal tilt slight shortening slight loss of radial inclination  Recommend changed to Percocet since 2 tablets of Norco 5 mg are not working and rex-ray in one week  Allergies: 1)  ! Sulfa   Medications Added to Medication List This Visit: 1)  Percocet 5-325 Mg Tabs (Oxycodone-acetaminophen) .... One by mouth q 4 hrs as needed pain  Other Orders: Post-Op Check (16109) Wrist x-ray complete, minimum 3 views (60454)  Patient Instructions: 1)  come back in a week xray left wrist 2)  only take percocet 5 1 by mouth q 4 hrs as needed pain, do not exceed 1 at a time Prescriptions: PERCOCET 5-325 MG TABS (OXYCODONE-ACETAMINOPHEN) one by mouth q 4 hrs as needed pain  #42 x 0   Entered and Authorized by:   Fuller Canada MD   Signed by:   Fuller Canada MD on 04/11/2010   Method used:   Print then Give to Patient   RxID:   [id number]

## 2010-12-27 NOTE — Progress Notes (Signed)
Summary: phone call from patient  ---- Converted from flag ---- ---- 04/16/2010 4:08 PM, Cammie Sickle wrote: Advised patient  ---- 04/16/2010 3:00 PM, Chasity L Kandis Ban wrote: keep that appt Thanks  ---- 04/16/2010 2:58 PM, Cammie Sickle wrote: Kara Woods - patient called Fri & I entered a ph.note - Dr Romeo Apple did review and sign off. She called back - say'g pain & swell'g - still keeping her up at night.  Is it normal to feel like it's worse rather than better?  Her appt is Wed am this wk Ph [phone] ------------------------------  Phone Note Call from Patient   Summary of Call: I will discuss this with her on Wednesday.  If she is having severe pain and swelling  usual rules apply

## 2010-12-27 NOTE — Progress Notes (Signed)
Summary: Appointment scheduled with Dr. Amanda Pea  Phone Note From Other Clinic   Summary of Call: Debbie with [name]. called to relay that Dr. Amanda Pea will see Kara Woods This Friday (05/25/10) at 11:15.  She is to arive at 11:00 . Will advise [name] of the appointment time.  Debbie's # [id number] Initial call taken by: Jacklynn Ganong,  May 23, 2010 8:20 AM

## 2010-12-27 NOTE — Medication Information (Signed)
Summary: Tax adviser   Imported By: Diana Eves 08/14/2009 11:09:21  _____________________________________________________________________  External Attachment:    Type:   Image     Comment:   External Document  Appended Document: RX Folder - canasa    Prescriptions: CANASA 1000 MG SUPP (MESALAMINE) one pr twice a week  #30 x 5   Entered and Authorized by:   Leanna Battles. Dixon Boos   Signed by:   Leanna Battles Dixon Boos on 08/14/2009   Method used:   Electronically to        [facility]* (retail)       [address]       [place], [place]  [address]       Ph: [phone] or [phone]       Fax: [phone]   RxID:   [id number]

## 2010-12-27 NOTE — Progress Notes (Signed)
Summary: advised of ref appointment and Rx question  Phone Note Outgoing Call   Call placed to: Patient Summary of Call: Called patient and advised of appointment w/Dr Amanda Pea 05/25/10, 11:15am, arrival by 11:00am.  Reminded to bring films.   Patient asked about dosage of pain medication, if correct and if okay to take Oxycodone in the am and again at night?  States she may have stated at appointment on 6/27 here that she had taken more dosages.  Please advise.  Also please advise if patient needs to take film from [facility] from May 2011.   Ph [phone]  Initial call taken by: Cammie Sickle,  May 23, 2010 9:17 AM  Follow-up for Phone Call        she advised [place] that she was allergic to Oxycodone, so I dont know why she would be taking it, she had rx from [place] for Norco 10, she advised [place] last visit that she was taking 2 at a time, Dr Rexene Edison said NO do not take but one every 4 hrs, he even explained about liver problems from taking too much pain med, so no she cannot take but one every 4 hrs, no refills Follow-up by: Ether Griffins,  May 23, 2010 9:59 AM  Additional Follow-up for Phone Call Additional follow up Details #1::        advised patient; states she did not say she was allergic to Oxycodone when she was here, and "must have been a miscommunication".  Said she is still taking the same medication that she was taking as when she first came. Patient Read to me the RX # 84 Hydrocodone APAP-325 mg ev 4 hrs.  States she only is allergic to Sulfa. Additional Follow-up by: Cammie Sickle,  May 23, 2010 3:36 PM    Additional Follow-up for Phone Call Additional follow up Details #2::    no she can only take 1 q 4 hrs as needed pain, no more, Dr. Rexene Edison said absolutely not Follow-up by: Ether Griffins,  May 23, 2010 3:43 PM

## 2010-12-27 NOTE — Consult Note (Signed)
Summary: [facility] Dr Amanda Pea  [facility] Dr Amanda Pea   Imported By: Cammie Sickle 06/04/2010 12:44:42  _____________________________________________________________________  External Attachment:    Type:   Image     Comment:   External Document

## 2010-12-27 NOTE — Medication Information (Signed)
Summary: CANASA 1000MG   CANASA 1000MG    Imported By: Rexene Alberts 11/29/2010 16:08:50  _____________________________________________________________________  External Attachment:    Type:   Image     Comment:   External Document  Appended Document: CANASA 1000MG     Prescriptions: CANASA 1000 MG SUPP (MESALAMINE) one pr at bedtime as directed  #30 x 0   Entered and Authorized by:   Gerrit Halls NP   Signed by:   Gerrit Halls NP on 11/29/2010   Method used:   Faxed to ...       [facility]* (retail)       [address]       [place], [place]  [address]       Ph: [phone] or [phone]       Fax: [phone]   RxID:   [id number]    Pt overdue for yearly office visit. Please have return. didn't give any additional refills.  Appended Document: CANASA 1000MG  pt aware of appt for 1-17 @ 0830

## 2010-12-27 NOTE — Miscellaneous (Signed)
Summary: Hand therapy plan of care  Hand therapy plan of care   Imported By: Jacklynn Ganong 06/01/2010 10:20:42  _____________________________________________________________________  External Attachment:    Type:   Image     Comment:   External Document

## 2010-12-27 NOTE — Assessment & Plan Note (Addendum)
Summary: SUPPOSSITORIES ARENT WORKING/HAS TO USE BATHROOM FREQUENTLY/SS   Visit Type:  Follow-up Visit Primary Care Provider:  Ouida Sills  CC:  follow up- has to use suppositories every day.  History of Present Illness: Kara Woods is a 77 year old pleasant Caucasian female who presents today in f/u due to proctitis. She has a well documented hx of proctitis, being diagnosed by colonoscopy in 2007. She was instructed to use Canasa supp 2x/week to maintain remission. She had come off suppositories, wasn't doing it twice/week..had just completely stopped. She resumed Canasa supp on Jan 5, each night at bedtime, and felt immediately better. Thus, she has been on nightly therapy for a total of 3 weeks. Now currently having one BM daily, no melena or hematochezia. Uses a laxative once/month.   She also has a hx of GERD that is well-controlled with protonix; however, she does c/o occasional dysphagia and feels she needs another dilatation. Hx of Schatzki's ring s/p dilatation in March 2009. She wants to schedule this for the last week in February.   Current Medications (verified): 1)  Tranxene-T 7.5 Mg Tabs (Clorazepate Dipotassium) .... Once Daily 2)  Advil 200 Mg Tabs (Ibuprofen) .... Prn 3)  Tylenol 325 Mg Tabs (Acetaminophen) .... As Needed 4)  Canasa 1000 Mg Supp (Mesalamine) .... One Pr At Bedtime As Directed 5)  Pantoprazole Sodium 40 Mg Tbec (Pantoprazole Sodium) .... One Daily As Needed 6)  Meloxicam 7.5 Mg Tabs (Meloxicam) .... As Needed  Allergies (verified): 1)  ! Sulfa  Past History:  Past Medical History: GERD proctitis (TCS 2007)  EGD 3/09: mild erosive reflux esophagitis, Schatzki's ring s/p dilatation  Past Surgical History: Reviewed history from 07/26/2008 and no changes required. Lumbar disc surgery-1993 Cardiac cath -1998 left retina detachment  Family History: Reviewed history from 04/04/2010 and no changes required. FH of Cancer:  Family History Coronary Heart  Disease female < 38 Family History of Arthritis No FH of Colon Cancer:  Social History: Reviewed history from 04/04/2010 and no changes required. 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 Eyes:  Denies blurring, irritation, and discharge. ENT:  Complains of difficulty swallowing; denies sore throat and hoarseness. CV:  Denies chest pains and syncope. Resp:  Denies dyspnea at rest and wheezing. GI:  Complains of difficulty swallowing; denies pain on swallowing, nausea, indigestion/heartburn, abdominal pain, diarrhea, constipation, change in bowel habits, bloody BM's, and black BMs. GU:  Denies urinary burning and urinary frequency. MS:  Denies joint pain / LOM, joint swelling, and joint stiffness. Derm:  Denies rash, itching, and dry skin. Neuro:  Denies weakness and syncope. Psych:  Denies depression and anxiety. Endo:  Denies cold intolerance and heat intolerance.  Vital Signs:  Patient profile:   77 year old female Height:      67 inches Weight:      168 pounds BMI:     26.41 Temp:     98.1 degrees F oral Pulse rate:   76 / minute BP sitting:   128 / 80  (left arm) Cuff size:   regular  Vitals Entered By: Hendricks Limes LPN (December 20, 2010 2:00 PM)  Physical Exam  General:  Well developed, well nourished, no acute distress. Head:  Normocephalic and atraumatic. Lungs:  Clear throughout to auscultation. Heart:  Regular rate and rhythm; no murmurs, rubs,  or bruits. Abdomen:  +BS, soft, non-tender, non-distended, no HSM, no rebound or guarding.  Neurologic:  Alert and  oriented x4;  grossly normal neurologically. Skin:  Intact without significant lesions or rashes. Psych:  Alert and cooperative. Normal mood and affect.  Impression & Recommendations:  Problem # 1:  PROCTITIS (ICD-51.34)  77 year old pleasant female with well-documented hx of proctitis, with  recommendations to continue canasa suppositories twice/week; pt had stopped abruptly with return of symptoms, resumed suppositories nightly X 3 weeks. Immediate improvement. No rectal bleeding. Doing well now.  Resume Canasa suppositories twice per week per rectum to maintain remission F/U in 1 year  Orders: Est. Patient Level II (16109)  Problem # 2:  GERD (ICD-530.81)  Long-standing hx of GERD with hx of EGD/ED in March 2009: Schatzki's ring s/p dilatation. +dysphagia, no reflux. On protonix daily. would like to schedule EGD/ED in later February due to schedule. No odynophagia, no loss of appetite, no weight loss.  EGD/ED to be performed in the near future: the R/B/A have been discussed in detail with the pt; stated understanding and wishes to proceed.   Orders: Est. Patient Level II (60454) Prescriptions: CANASA 1000 MG SUPP (MESALAMINE) one pr at bedtime as directed  #120 x 0   Entered and Authorized by:   Gerrit Halls NP   Signed by:   Gerrit Halls NP on 12/20/2010   Method used:   Faxed to ...       [facility]* (retail)       [address]       [place], [place]  [address]       Ph: [phone] or [phone]       Fax: [phone]   RxID:   [id number]   Appended Document: SUPPOSSITORIES ARENT WORKING/HAS TO USE BATHROOM FREQUENTLY/SS 1 YR F/U OPV IS IN THE COMPUTER

## 2010-12-27 NOTE — Progress Notes (Signed)
Summary: call from patient still some swelling  Phone Note Call from Patient   Caller: Patient Summary of Call: Patient called to relay that she still has swelling around left elbow area and some in her hand.  Asked to speak w/nurse, who is not in office. Advised continue same instructions as per visit here w/Dr Romeo Apple on 04/11/10.  Her next appt + Xray is Wed 04/18/10. Initial call taken by: Cammie Sickle,  Apr 13, 2010 12:12 PM

## 2010-12-27 NOTE — Miscellaneous (Signed)
Summary: Hand therapy progress note  Hand therapy progress note   Imported By: Jacklynn Ganong 05/24/2010 14:40:01  _____________________________________________________________________  External Attachment:    Type:   Image     Comment:   External Document

## 2010-12-27 NOTE — Assessment & Plan Note (Signed)
Summary: [facility] FOL/UP/FX LT ARM/XRAY [facility] 04/03/10/MEDICARE HMO INS/CAF   Vital Signs:  Patient profile:   77 year old female Height:      67 inches Weight:      151 pounds Pulse rate:   76 / minute Resp:     16 per minute  Vitals Entered By: Fuller Canada MD (Apr 04, 2010 10:51 AM)  Visit Type:  new patient Referring Provider:  ap er Primary Provider:  Ouida Sills  CC:  left wrist fracture.  History of Present Illness: I saw Kara Woods in the office today for an initial visit.  She is a 77 years old woman with the complaint of:  left wrist fracture.  Fell 04/03/10, slipped on water.  Xrays left wrist and elbow on [facility] 04/03/10.  Meds: Tranzene.  pain is located over the LEFT distal radius and elbow, it is best described as sharp stabbing pain, severity is 10 out of 10, duration is constant, timing is with movement and since the injury on the 10th.  Context it improves with nothing at present and worsens with movement.  There are no modifying factors other than stated.  Associated symptoms include swelling and bruising.      Allergies: 1)  ! 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  Physical Exam  Additional Exam:  Constitutional: vital signs see recorded values. General: normal development, nutrition, and grooming. No deformity. Body Habitus is medium. CDV: Observation and palpation was normal  Lymph: palpation of the lymph nodes were normal Skin: inspection and palpation of the skin revealed no abnormalities  Neuro:  coordination: normal              DTR's normal              Sensation was normal  Psyche: Alert and oriented x 3. Mood was normal.  Affect: normal  MSK: Gait: normal   evaluation of the LEFT wrist inspection reveals tenderness over the distal radius no tenderness noted in the elbow some tenderness in the forearm.  Her range of motion is limited by pain at the wrist and elbow.  Her wrist and elbow are stable.  Her muscle tone is normal her muscle strength grade is deferred skin is intact no major deformities seen in the wrist  RIGHT upper extremity inspection was normal range of motion was full stability was normal in strength was normal  LOWER EXTREMS: Normal alignment and no atrophy, subluxation or tremor or contracture       Impression & Recommendations:  Problem # 1:  COLLES' FRACTURE, LEFT (ICD-813.41) Assessment New  x-rays show that there is a comminuted fracture of the distal radius and a small ulnar styloid fracture on the anteroposterior view there is minimal shortening of the fracture there is some loss of the radial inclination angle.  On the lateral view there is mild dorsal tilt of the distal radial fragment.  However the fracture appears to be a non-operative treatment criteria of less than 10 mm or 5 mm of shortening and less than 10 mm or 10 change in the dorsal volar tilt.  A short arm  cast is applied the patient is warned that she may still need surgery and that serial x-rays are needed to follow the fracture  Orders: New Patient Level IV (11914) Distal Radius Fx (25600)  Medications Added to Medication List This Visit: 1)  Promethazine Hcl 25 Mg Tabs (Promethazine hcl) .... One by mouth q 6 hrs as needed nausea 2)  Norco 5-325 Mg Tabs (Hydrocodone-acetaminophen) .Marland Kitchen.. 1-2 by mouth q 4 as needed pain  Patient Instructions: 1)  Please do not get the cast wet. It will casue a severe skin reaction. If you do get it wet, dry it with a hairdryer on a low setting and  call the office. [the cast will need to be changed]  2)  xrays in cast in 1 week  3)  take advil 600 mg three times a day  4)  phenergan with the pain pills  Prescriptions: NORCO 5-325 MG TABS (HYDROCODONE-ACETAMINOPHEN) 1-2 by mouth q 4 as needed pain  #60 x 2   Entered and Authorized by:   Fuller Canada MD   Signed by:   Fuller Canada MD on 04/04/2010   Method used:   Print then Give to Patient   RxID:   [id number] PROMETHAZINE HCL 25 MG TABS (PROMETHAZINE HCL) one by mouth q 6 hrs as needed nausea  #60 x 1   Entered and Authorized by:   Fuller Canada MD   Signed by:   Fuller Canada MD on 04/04/2010   Method used:   Faxed to ...       [facility]* (retail)       [address]       [place], [place]  [address]       Ph: [phone] or [phone]       Fax: [phone]   RxID:   [id number]

## 2010-12-27 NOTE — Progress Notes (Signed)
Summary: should she be using ointment?  Phone Note Call from Patient   Summary of Call: Kara Woods (December 11, 2032) has a blister at the base of her thumb where the cast is rubbing (said you know about it).  Wants to know if there is an ointment or anything she can put on it.  Also concerned about the swelling.  She uses [facility] Her # [phone] Initial call taken by: Jacklynn Ganong,  May 10, 2010 9:27 AM  Follow-up for Phone Call        she doesn't have a cast on right now she is on a splint over the blister with a Band-Aid elevate the hand and ice it 3 times a day for the swelling Follow-up by: Fuller Canada MD,  May 10, 2010 9:38 AM  Additional Follow-up for Phone Call Additional follow up Details #1::        Advised the patient of doctor's reply Additional Follow-up by: Jacklynn Ganong,  May 10, 2010 10:59 AM

## 2010-12-27 NOTE — Miscellaneous (Signed)
Summary: Hand therapy progress note  Hand therapy progress note   Imported By: Jacklynn Ganong 06/06/2010 11:30:02  _____________________________________________________________________  External Attachment:    Type:   Image     Comment:   External Document

## 2010-12-27 NOTE — Letter (Signed)
Summary: History form  History form   Imported By: Jacklynn Ganong 04/04/2010 16:11:20  _____________________________________________________________________  External Attachment:    Type:   Image     Comment:   External Document

## 2010-12-27 NOTE — Assessment & Plan Note (Signed)
Summary: 2 WK RE-CK/XRAY OOP LT ARM/FX CARE/SEC HORIZ/CAF   Visit Type:  Follow-up Referring Provider:  ap er Primary Provider:  Ouida Sills  CC:  left wrist.  History of Present Illness: I saw Kara Woods in the office today for a 1 week  followup visit.  She is a 77 years old woman with the complaint of:  left wrist fracture.  Fell 04/03/10, slipped on water.  Xrays left wrist and elbow on [facility] 04/03/10.  Meds: Norco 10 takes 2 every 3 hrs for pain, Percocet made her sick.  Today is 2 week recheck and xray left wrist, has been in removable splint for over a week now.  Went for OT hand and rehab per [place], she had to see him while you were OOT, she was having pain and could not move thumb left wrist.  [name] 05/10/10 sprained right ankle, xrays [facility], no tx, in flip flops today, no limp.              Allergies: 1)  ! Sulfa  Past History:  Past Medical History: Last updated: 04/04/2010 na  Past Surgical History: Last updated: 07/26/2008 Lumbar disc surgery-1993 Cardiac cath -1998 left retina detachment  Family History: Last updated: 04/04/2010 FH of Cancer:  Family History Coronary Heart Disease female < 58 Family History of Arthritis  Social History: Last updated: 04/04/2010 Patient is widowed.  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 FH of Cancer:  Family History Coronary Heart Disease female < 19 Family History of Arthritis  Social History: Reviewed history from 04/04/2010 and no changes required. Patient is widowed.  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  Physical  Exam  Additional Exam:  LEFT HAND IS SWOLLEN   THERE CONTINUES TO BE TENDERNESS AT HE FRACTURE SITE   SHE IS ALSO TENDER AT THE DRUJ      Impression & Recommendations:  Problem # 1:  COLLES' FRACTURE, LEFT (ICD-813.41) Assessment Deteriorated XRAYS TODAY:   DORSAL TILT APPEARS WORSE  SHORTENING IS LESS THAN   I THINK SHE SHOULD SEE SPECIALIST FOR POSSIBLE REALIGNMNET AND PLATING   PRFER DR Butler Denmark    Orders: Orthopedic Surgeon Referral (Ortho Surgeon) Post-Op Check [phone]) Wrist x-ray complete, minimum 3 views (73110)  Patient Instructions: 1)  REFERRAL TO DR GRAMMIG  2)  OK TO STOP PT/OT

## 2010-12-27 NOTE — Progress Notes (Signed)
Summary: arm swelling/hurting badly  Phone Note Call from Patient   Summary of Call: Kara Woods (2033-02-07) says for the last 2 days her left arm is sweling and hurting very badly, thumb also swollen.  She has ben icing and elevating and taking 600 mg Ibupfroen without any releif.  Any other advice?  She is scheduled to come back in Wednesday 5/18.  She uses [facility].  Her # [phone] Initial call taken by: Jacklynn Ganong,  Apr 09, 2010 1:09 PM  Follow-up for Phone Call        has to come in today for cast check ? split  Follow-up by: Fuller Canada MD,  Apr 09, 2010 1:38 PM  Additional Follow-up for Phone Call Additional follow up Details #1::        PATIENT ADVISED, COMING IN TODAY Additional Follow-up by: Jacklynn Ganong,  Apr 09, 2010 2:18 PM

## 2010-12-27 NOTE — Progress Notes (Signed)
Summary: Referral to Dr. Butler Denmark.  Phone Note Outgoing Call   Call placed by: Waldon Reining,  May 22, 2010 9:16 AM Call placed to: Specialist Action Taken: Information Sent Summary of Call: I faxed a referral for this patient to [facility] to see Dr. Butler Denmark for Colles fracture.

## 2010-12-27 NOTE — Progress Notes (Signed)
  Phone Note Call from Patient   Summary of Call: Kara Woods called this morning asking to see Dr Romeo Apple, said she had fallen again.  Told her Dr. Romeo Apple is out of the office the entire week, and Dr, Hilda Lias is on call.   She said OK Initial call taken by: Jacklynn Ganong,  May 14, 2010 8:32 AM

## 2010-12-27 NOTE — Medication Information (Signed)
Summary: Tax adviser   Imported By: Diana Eves 09/13/2009 12:31:03  _____________________________________________________________________  External Attachment:    Type:   Image     Comment:   External Document  Appended Document: RX FolderPANTOPRAZOLE    Prescriptions: PANTOPRAZOLE SODIUM 40 MG TBEC (PANTOPRAZOLE SODIUM) ONE by mouth DAILY FOR ACID REFLUX  #30 x 1   Entered and Authorized by:   Joselyn Arrow FNP-BC   Signed by:   Joselyn Arrow FNP-BC on 09/14/2009   Method used:   Electronically to        [facility] (retail)       [address]       [place], [place]  [address]       Ph: [phone] or [phone]       Fax: [phone]   RxID:   [id number]  PT NEEDS OV PRIOR TO FURTHER RF'S.   Appended Document: RX Folder Ov sch for 09/18/2009 @ 11:30 w/ LSL.  Pt aware of appt.  AS

## 2010-12-27 NOTE — Assessment & Plan Note (Signed)
Summary: 2 WK RE-CK/XRAYS LT ARM IN PLASTER/FX CARE/SEC HORIZ/CAF   Visit Type:  Follow-up Referring Provider:  ap er Primary Provider:  Ouida Sills  CC:  fx care left arm.  History of Present Illness: I saw Kara Woods in the office today for a 1 week  followup visit.  She is a 77 years old woman with the complaint of:  left wrist fracture.  4 week recheck xrays today in plaster  Fell 04/03/10, slipped on water.  Xrays left wrist and elbow on [facility] 04/03/10.  Meds: Norco 5 takes 1 every 3 hrs for pain, Percocet made her sick.  We changed her cast today. Her x-rays show no change in position of the fracture position. The fracture is acceptable.  Was placed in a new cast. We increased her Norco 10 mg,  She'll follow up in 2 weeks for x-ray out of plaster and rotation of removable splint.      Allergies: 1)  ! Sulfa   Impression & Recommendations:  Problem # 1:  COLLES' FRACTURE, LEFT (ICD-813.41) Assessment Comment Only  Orders: Post-Op Check ([address]) Wrist x-ray complete, minimum 3 views ([address])  Medications Added to Medication List This Visit: 1)  Norco 10-325 Mg Tabs (Hydrocodone-acetaminophen) .Marland Kitchen.. 1 -2 tabs q 4 hrs as needed pain  Patient Instructions: 1)  Please schedule a follow-up appointment in 2 weeks. 2)  xr oop Prescriptions: NORCO 10-325 MG TABS (HYDROCODONE-ACETAMINOPHEN) 1 -2 tabs q 4 hrs as needed pain  #84 x 1   Entered and Authorized by:   Fuller Canada MD   Signed by:   Fuller Canada MD on 05/02/2010   Method used:   Print then Give to Patient   RxID:   [id number]

## 2010-12-27 NOTE — Letter (Signed)
Summary: TCS ORDER  TCS ORDER   Imported By: Ave Filter 12/11/2010 12:35:53  _____________________________________________________________________  External Attachment:    Type:   Image     Comment:   External Document

## 2010-12-27 NOTE — Medication Information (Signed)
Summary: PANTOPRAZOLE SODIUM 40MG   PANTOPRAZOLE SODIUM 40MG    Imported By: Rexene Alberts 10/25/2010 13:55:56  _____________________________________________________________________  External Attachment:    Type:   Image     Comment:   External Document  Appended Document: PANTOPRAZOLE SODIUM 40MG     Prescriptions: PANTOPRAZOLE SODIUM 40 MG TBEC (PANTOPRAZOLE SODIUM) one daily as needed  #30 x 11   Entered and Authorized by:   Leanna Battles. Dixon Boos   Signed by:   Leanna Battles Dixon Boos on 10/25/2010   Method used:   Electronically to        [facility] (retail)       [address]       [place], [place]  [address]       Ph: [phone] or [phone]       Fax: [phone]   RxID:   [id number]

## 2010-12-27 NOTE — Assessment & Plan Note (Signed)
Summary: CAST CHECK/SEC HOR/BSF   Visit Type:  Follow-up Referring Provider:  ap er Primary Provider:  Ouida Sills  CC:  cast check.  History of Present Illness: I saw Kara Woods in the office today for an initial visit.  She is a 77 years old woman with the complaint of:  left wrist fracture.  Fell 04/03/10, slipped on water.  Xrays left wrist and elbow on [facility] 04/03/10.  Meds: Tranzene, Norco 5 no relief, has pain in the wrist and left thumb.  Today is cast check, no injury, no cast problem, only pain, medication does not help.  Has fu appt already.  Seems like the cast is tight so I relieve the cast by bivalving it rewrapping it  She seemed to improve with that followup Wednesday for x-ray       Allergies: 1)  ! Sulfa   Other Orders: Post-Op Check ([address])  Patient Instructions: 1)  weds as scheduled

## 2011-01-07 ENCOUNTER — Encounter (INDEPENDENT_AMBULATORY_CARE_PROVIDER_SITE_OTHER): Payer: Self-pay | Admitting: *Deleted

## 2011-01-16 NOTE — Letter (Signed)
Summary: Recall Radiology  [facility]  [address]   [place], [place] [address]   Phone: [phone]  Fax: [phone]    January 07, 2011  Kara Woods [address] [place], [place]  [address] Jan 30, 1933   Dear Ms. Hyppolite,   Our office needs to get you scheduled for your Upper Endoscopy. Please give our office a call to schedule this.  You may call the office at your convenience at [phone].  Please ask for the Referral Coordinator to make arrangements for this to be scheduled.  You may have to leave a message on our voice mail.  We will return your call.  If for any reason you do not wish to schedule this, please advise the office.  Please do not neglect your health.   Thank you,    Ave Filter  [facility] Ph: [phone]   Fax: [phone]

## 2011-02-07 LAB — CBC
HCT: 40.9 % (ref 36.0–46.0)
Hemoglobin: 14 g/dL (ref 12.0–15.0)
MCH: 30.5 pg (ref 26.0–34.0)
MCHC: 34.2 g/dL (ref 30.0–36.0)
MCV: 89.1 fL (ref 78.0–100.0)
Platelets: 269 10*3/uL (ref 150–400)
RBC: 4.59 MIL/uL (ref 3.87–5.11)
RDW: 13.2 % (ref 11.5–15.5)
WBC: 11 10*3/uL — ABNORMAL HIGH (ref 4.0–10.5)

## 2011-02-07 LAB — POCT I-STAT, CHEM 8
BUN: 11 mg/dL (ref 6–23)
Calcium, Ion: 1.09 mmol/L — ABNORMAL LOW (ref 1.12–1.32)
Chloride: 106 mEq/L (ref 96–112)
Creatinine, Ser: 0.7 mg/dL (ref 0.4–1.2)
Glucose, Bld: 106 mg/dL — ABNORMAL HIGH (ref 70–99)
HCT: 44 % (ref 36.0–46.0)
Hemoglobin: 15 g/dL (ref 12.0–15.0)
Potassium: 3.5 mEq/L (ref 3.5–5.1)
Sodium: 138 mEq/L (ref 135–145)
TCO2: 24 mmol/L (ref 0–100)

## 2011-02-07 LAB — DIFFERENTIAL
Basophils Absolute: 0 10*3/uL (ref 0.0–0.1)
Basophils Relative: 0 % (ref 0–1)
Eosinophils Absolute: 0.1 10*3/uL (ref 0.0–0.7)
Eosinophils Relative: 1 % (ref 0–5)
Lymphocytes Relative: 31 % (ref 12–46)
Lymphs Abs: 3.5 10*3/uL (ref 0.7–4.0)
Monocytes Absolute: 0.6 10*3/uL (ref 0.1–1.0)
Monocytes Relative: 6 % (ref 3–12)
Neutro Abs: 6.7 10*3/uL (ref 1.7–7.7)
Neutrophils Relative %: 61 % (ref 43–77)

## 2011-04-09 NOTE — Assessment & Plan Note (Signed)
Kara Woods, Kara Woods               CHART#:  [id number]   DATE:  02/02/2008                       DOB:  Jul 24, 1933   CHIEF COMPLAINT:  Heartburn, trouble swallowing, history of proctitis.   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  Has been, overall, doing well aside from some intermittent  episodes of tenesmus and diarrhea for which she puts herself on a short  course of mesalamine suppositories as previously recommended.  She has  had 3 episodes since May of last year.  Most recent episode was just in  the last couple of weeks.  She goes back on Canasa 1 g mesalamine  suppositories every other day for about 2 weeks and her bowel function  returns to normal.  She did have proctitis endoscopically and  histologically with otherwise a negative upstream.  Colon:  There is no  family history of colorectal neoplasia or inflammatory bowel disease.  She is no longer taking any nonsteroidals.  She has been seeing Dr.  Ouida Sills regularly.  She also reports heartburn almost every day.  She  tells me she has been given samples of various acid suppressing agents  by Dr. Ouida Sills along the way but does not have a prescription, and also  reports having esophageal dysphagia to solids which has insidiously  worsened over the past year or so.  She has had heartburn symptoms that  have really developed and worsened in the past 3 to 4 years.  She drinks  a glass of wine 2 or 3 times yearly, otherwise no alcohol and has not  consumed any tobacco in 20 years.   She has lost 7 pounds in the past 1 year, trying to become more fit (by  our scales).  She has not had any melena or hematochezia, no nausea or  vomiting, no odynophagia, no early satiety.   PAST MEDICAL HISTORY:  History of proctitis, history of GERD.   CURRENT MEDICATIONS:  1. Tranxene 1 tablet daily.  2. Tylenol p.r.n. for various aches  and pains.   ALLERGIES:  SULFA.   FAMILY HISTORY:  Negative for chronic GI or liver illness.   SOCIAL HISTORY:  The patient is a widow, she has 7 children.  She is  retired from [employer].  No tobacco in 20 years, rarely consumes  alcoholic beverages in the form of wine.   REVIEW OF SYSTEMS:  No chest pain, no dyspnea on exertion.  No fever and  chills, otherwise as in history of present illness.   PHYSICAL EXAMINATION:  A pleasant 77 year old lady resting comfortably.  Weight 155, height 5 feet 7-1/2 inches, temp 97.5, BP 120/80, pulse 76.  SKIN:  Warm and dry.  There is no jaundice.  HEENT EXAM:  No scleral icterus.  Conjunctivae are pink.  CHEST:  Lungs are clear to auscultation.  CARDIAC EXAM:  Regular rate and rhythm without murmur, gallop, or rub.  ABDOMEN:  Nondistended, positive bowel sounds, soft, entirely nontender  without appreciable mass or organomegaly.  EXTREMITY EXAM:  No edema.   IMPRESSION:  The patient is a pleasant 77 year old lady with well-  documented proctitis.  She has had an occasional flare over the past 1  year.  She gains rapid remission with  a course of mesalamine  suppositories, and she is currently doing well from a standpoint of her  lower gastrointestinal tract.  I have recommended that she probably go  ahead and use an approach of 1 g mesalamine suppository per rectum twice  weekly after she gains remission, as she likely needs some topical  antiinflammatory, otherwise she is going to have a flare.  If she does  have a flare, I would increase frequency to every other day for a couple  of weeks then back off.  I told her it would be a good idea if she can  maintain remission, to go ahead and take 1 Canasa suppository twice  weekly.  We had a discussion as to the possibility of inflammatory bowel  disease becoming more extensive, but she does so well on topical agents,  I see no reason to add a systemic agent at this point in time.  This   slightly increased risk of colon cancer with in the presence of  inflammatory bowel disease for several years was reviewed.   Her other major issue, now, is 3 to 4 year history of heartburn symptoms  which are now almost every day, and she now describes esophageal  dysphagia to solid food.  She has never had her upper gastrointestinal  tract evaluated.  She needs to have an esophagogastroduodenoscopy with  esophageal dilation as appropriate.  I discussed this approach with the  patient along with potential risks, benefits, and alternatives and  limitations.  Her questions were answered.  Will plan to perform  esophagogastroduodenoscopy with dilation as appropriate in the very near  future at [facility].  Make further recommendations at that  time.       Jonathon Bellows, M.D.  Electronically Signed     RMR/MEDQ  D:  02/02/2008  T:  02/02/2008  Job:  [id number]   cc:   Kingsley Callander. Ouida Sills, MD

## 2011-04-09 NOTE — Procedures (Signed)
Kara Woods, Kara Woods              ACCOUNT NO.:  [id number]   MEDICAL RECORD NO.:  [id number]          PATIENT TYPE:  OUT   LOCATION:  RESP                          FACILITY:  [facility]   PHYSICIAN:  Edward L. Juanetta Gosling, M.D.DATE OF BIRTH:  [birth year]   DATE OF PROCEDURE:  DATE OF DISCHARGE:                            PULMONARY FUNCTION TEST   1. Spirometry shows no ventilatory defect and no definite airflow      obstruction.  2. Lung volumes show mild reduction in TLC at 77% of predicted with      normal being 80%.  3. DLC was mildly reduced.   This study is consistent with COPD with some restrictive change as well.  Clinical correlation is suggested.      Edward L. Juanetta Gosling, M.D.  Electronically Signed     ELH/MEDQ  D:  08/03/2007  T:  08/04/2007  Job:  [id number]   cc:   Kingsley Callander. Ouida Sills, MD  Fax: [phone]

## 2011-04-09 NOTE — Assessment & Plan Note (Signed)
Kara Woods, Kara Woods               CHART#:  [id number]   DATE:  06/01/2008                       DOB:  [birth year]   CHIEF COMPLAINT:  Loss of control of bowels, something coming out of my  rectum.   SUBJECTIVE:  The patient is a 77 year old Caucasian female who presents  today for further evaluation of difficulty controlling her bowels and  feeling like her rectum is falling out.  She does have a well-documented  history of proctitis at the time of colonoscopy, back in 2007.  She has  intermittent flares, which she treats with Canasa suppositories.  At her  last office visit, back in March, she was recommended to take Canasa 2  times weekly to maintain remission.  She admits, last 3 to 4 weeks she  forgot to take the medication.  Last week, when she called, she was  having more pain and pressure in the rectum and having more urgency and  inability to control her stools.  She was having up to 5 bowel movements  daily.  Denies any blood in the stool.  She started right back on the  Canasa 1000 mg at bedtime and took for 4 days straight and felt  completely back to normal.  Now, she is back taking Canasa one per  rectum, twice a week.  She denies any abdominal pain, blood in the  stool, nausea or vomiting, heartburn, dysphagia, or odynophagia.   CURRENT MEDICATIONS:  See updated list.   ALLERGIES:  Sulfa.   PHYSICAL EXAMINATION:  VITAL SIGNS:  Weight 158, temperature 98.3, blood  pressure 110/68, and pulse 76.  GENERAL:  Pleasant, well-nourished, well-developed Caucasian female in  no acute distress.  SKIN:  Warm and dry.  No jaundice.  ABDOMEN:  Positive bowel sounds.  Soft, nontender, and nondistended.  No  organomegaly, or masses.  RECTAL:  No lesions externally.  No masses in the rectal vault.  No  evidence of prolapse.  Brown secretions are hemoccult negative.   IMPRESSION:  The patient is a 77 year old lady with history of proctitis  with recent flare.  She appears to be  back in remission.   PLAN:  1. Urged her to use Canasa 1000 mg per rectum twice a week,      prescription for #60 and 5      refills given.  2. Office visit in 1 year or sooner, if needed.       Tana Coast, P.A.  Electronically Signed     Kassie Mends, M.D.  Electronically Signed    LL/MEDQ  D:  06/01/2008  T:  06/02/2008  Job:  [id number]   cc:   Kingsley Callander. Ouida Sills, MD  R. Roetta Sessions, M.D.

## 2011-04-09 NOTE — Op Note (Signed)
NAMEZOELLA, ROBERTI              ACCOUNT NO.:  [id number]   MEDICAL RECORD NO.:  [id number]          PATIENT TYPE:  AMB   LOCATION:  DAY                           FACILITY:  [facility]   PHYSICIAN:  R. Roetta Sessions, M.D. DATE OF BIRTH:  [birth year]   DATE OF PROCEDURE:  02/09/2008  DATE OF DISCHARGE:                               OPERATIVE REPORT   PROCEDURE:  Esophagogastroduodenoscopy with Elease Hashimoto dilation.   INDICATIONS FOR PROCEDURE:  A 74-year lady with recent reflux symptoms  that have gotten worse in a crescendo fashion and esophageal dysphagia.  EGD with dilation is now being done.  This approach has been discussed  with the patient at length, potential risks, benefits and alternatives  have been reviewed, questions answered.  She is agreeable.  Please see  the documentation in the medical record.   PROCEDURE NOTE:  O2 saturation, blood pressure, pulse and respirations  were monitored throughout the entirety of the procedure.  Conscious  sedation:  Versed 4 mg IV, Demerol 75 mg IV in divided doses.  Instrument:  Pentax video chip system.  Cetacaine spray for topical  pharyngeal anesthesia.   FINDINGS:  Examination of the tubular esophagus revealed a couple of  distal esophageal erosions and what appeared be a noncritical ring.  There was no tumor.  There was no Barrett's esophagus or other  abnormality.  The EG junction was easily traversed.   Stomach:  The gastric cavity was empty and insufflated well with air.  A  thorough examination of the gastric mucosa including retroflexion in the  proximal stomach-esophagogastric junction demonstrated no abnormalities.  Patient really did not have a hiatal hernia.  Pylorus patent and easily  traversed.  Examination of the bulb and second portion revealed no  abnormalities.   therapeutic/diagnostic maneuvers performed:  The scope was withdrawn.  A  54-French Maloney dilators was passed to full insertion with some  resistance (mild).   A look back revealed the ring remained intact.  Subsequently the scope was withdrawn and a 56-French Maloney dilator was  passed with mild to moderate resistance.  A look back revealed the ring  had been ruptured without apparent complication.  Otherwise the patient  tolerated the procedure very well, was reacted in endoscopy.   IMPRESSION:  Schatzki ring status post dilation, described above.  Distal esophageal erosion consistent with mild erosive reflux  esophagitis, otherwise unremarkable esophagus, normal stomach, D1, D2.   RECOMMENDATIONS:  1. Begin Protonix 40 mg orally daily.  2. Gradually resume a regular diet later today.  3. Plan to see her back in the office in 6 weeks.      Jonathon Bellows, M.D.  Electronically Signed     RMR/MEDQ  D:  02/09/2008  T:  02/09/2008  Job:  [id number]   cc:   Kingsley Callander. Ouida Sills, MD  Fax: [phone]

## 2011-04-09 NOTE — Assessment & Plan Note (Signed)
NAMERODNEISHA, BONNET               CHART#:  [id number]   DATE:  04/16/2007                       DOB:  1933-08-14   History of proctitis.  Last seen 02/23/2007.  She was doing well at that  time.  She had a possible flare back in February of this year.  She had  been taking Advil.  She stopped taking Advil and did well until a  weekend last month.  She called in and stated she was having some  tenesmus, a little bit of rectal bleeding.  She was started on  __________  suppositories one at bedtime for two weeks and then every  other day for two weeks and she is here, completing that course and back  to baseline.  One formed stool daily.  No abdominal pain, no tenesmus.  In fact, she has been a little constipated.  Overall, doing very well.  Colonoscopy back in 04/2006 demonstrated proctitis.  Otherwise, the  colon and terminal ileum appeared normal.   CURRENT MEDICATIONS:  See updated list.   ALLERGIES:  SULFA.   PHYSICAL EXAMINATION:  GENERAL:  Looks well.  VITAL SIGNS:  Weight 162, height 5' 7-1/2, temp 97.6, BP 110/68, pulse  64.  SKIN:  Warm and dry.  ABDOMEN:  Flat.  Positive bowel sounds.  Soft, nontender.  No  appreciable mass or organomegaly.   ASSESSMENT:  Idiopathic proctitis.  Rapid response to topical  mesalamine.  Avoidance of NSAIDS I feel has probably helped this nice  lady.   RECOMMENDATIONS:  Would avoid NSAIDS as much as possible in the future  should she have another flare.  She is to let me know.  Otherwise, plan  to see her back in one year.       Jonathon Bellows, M.D.  Electronically Signed     RMR/MEDQ  D:  04/16/2007  T:  04/16/2007  Job:  [id number]   cc:   Kingsley Callander. Ouida Sills, MD

## 2011-04-09 NOTE — Procedures (Signed)
NAMEALAN, Kara Woods              ACCOUNT NO.:  [id number]   MEDICAL RECORD NO.:  [id number]          PATIENT TYPE:  OUT   LOCATION:  RAD                           FACILITY:  [facility]   PHYSICIAN:  Kingsley Callander. Ouida Sills, MD       DATE OF BIRTH:  [birth year]   DATE OF PROCEDURE:  07/29/2007  DATE OF DISCHARGE:                                  STRESS TEST   The patient underwent an exercise stress test to evaluate symptoms of  shortness of breath with exertion.  She exercised 5 minutes 11 seconds  (2 minutes 11 seconds into stage 2), obtaining a maximal heart rate 139  (94% of the age predicted maximum heart rate) and a workload of 7.0  METS.  Discontinued exercise due to shortness of breath.  There are no  symptoms of angina.  She became short of breath during stage 1 and her  oxygen saturations remained in the mid 90s throughout the exam.  There  were occasional PACs.  Baseline electrocardiogram revealed normal sinus  rhythm at 75 beats per minute with nonspecific T-wave flattening.  With  exercise, there were ST-segment changes diagnostic of ischemia.   IMPRESSION:  No evidence of exercise-induced ischemia.  Normal oxygen  saturations.  Limited exercise tolerance due to dyspnea.  We will  evaluate further with a pulmonary function studies.      Kingsley Callander. Ouida Sills, MD  Electronically Signed     ROF/MEDQ  D:  07/29/2007  T:  07/29/2007  Job:  [id number]
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
## 2011-04-12 NOTE — Op Note (Signed)
Kara Woods, Kara Woods              ACCOUNT NO.:  [id number]   MEDICAL RECORD NO.:  [id number]          PATIENT TYPE:  AMB   LOCATION:  DAY                           FACILITY:  [facility]   PHYSICIAN:  R. Roetta Sessions, M.D. DATE OF BIRTH:  [birth year]   DATE OF PROCEDURE:  05/06/2006  DATE OF DISCHARGE:                                 OPERATIVE REPORT   PROCEDURE:  Colonoscopy with biopsy.   INDICATIONS FOR PROCEDURE:  A 77 year old lady with a several-week history  of bloody mucous-like bowel movements.  She denies frank diarrhea.  Has not  had any associated abdominal pain.  She has no upper GI tract symptoms.  There is no family history of colorectal neoplasia.  She has never had a  colonoscopy previously.  She was set up to have a colonoscopy.  However, as  recommended by Dr. Ouida Sills in the past but she declined at the last minute to  undergo it.  Colonoscopy is now being done.  The procedure has been  discussed with the patient at length.  Potential risks, benefits,  alternatives have been reviewed and questions answered.  She is agreeable.  Please see documentation on medical record.   PROCEDURE NOTE:  O2 saturation, blood pressure, pulse were monitored at the  time of the procedure.  Conscious sedation with Versed 5 mg IV and Demerol  100 mg IV in divided doses.   INSTRUMENT:  Olympus video system.   FINDINGS:  Digital rectal exam revealed no abnormalities.   ENDOSCOPIC FINDINGS:  Prep was good.  Examination of the rectum, including a  retroflexed view of the anal verge, revealed diffuse granularity of the  rectal mucosa, extending to 18 cm.  There were some superficial erosions.  The vascular pattern was preserved.  However, there were no frank ulcers,  but the rectal mucosa was diffusely abnormal.  At approximately 18 cm from  the rectosigmoid junction, there is a demarcation to what upstream appeared  to be entirely normal colonic mucosa.  The colon was surveyed from the  rectosigmoid junction through the left transverse, right colon, the  appendiceal orifice, ileocecal valve and cecum where the str4uctures were  well seen and photographed for the record.  The terminal ileum was intubated  to 5 cm.  The Olympus scope was slowly withdrawn and all mucosal surfaces  were again seen.  The colonic mucosa and terminal ileum mucosa appeared  normal.  The rectum was biopsied for histologic study.  The patient  tolerated the procedure well and was reacted in endoscopy.   IMPRESSION:  Diffuse inflammatory changes of the rectal mucosa, consistent  with proctitis.  Otherwise, normal colon to terminal ileum.   RECOMMENDATIONS:  Begin Canasa 1 g, salamine suppository one per rectum at  bedtime.  Will take these suppositories for the next one month.  Will plan  to see this nice lady back in the office in approximately two months to see  how she is doing.  Follow up on path in the interim.      Jonathon Bellows, M.D.  Electronically Signed     RMR/MEDQ  D:  05/06/2006  T:  05/06/2006  Job:  [id number]   cc:   Kingsley Callander. Ouida Sills, MD  Fax: [phone]
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
## 2011-04-12 NOTE — Procedures (Signed)
[facility]  Patient:    NATANYA, HOLECEK Visit Number: [id number] MRN: [id number]          Service Type: MED Location: 2A A227 01 Attending Physician:  Carylon Perches Dictated by:   Dietrich Pates, M.D. Proc. Date: 06/04/02 Admit Date:  06/03/2002 Discharge Date: 06/06/2002   CC:         Carylon Perches, M.D.   Echocardiograms  REFERRING PHYSICIAN:  Carylon Perches, M.D.  TEST INDICATIONS:  A 76 year old with a history of syncope, chest pain, and test to evaluate.  A 2-D ECHO WITH ECHO DOPPLER:  Left ventricle is normal in size with an end-diastolic dimension of 36 mm.  The interventricular septum and posterior wall are normal at 10 mm each.  The left atrium is normal at 38 mm.  The right atrium and right ventricle are normal.  The aortic valve is normal with no insufficiency.  Mitral valve is mildly thickened with mild insufficiency (1 out of 4).  Pulmonic valve is normal with trace insufficiency.  Tricuspid valve is normal with mild insufficiency, 1 out of 4.  Estimated PA pressure by TR jet is 35 mmHg.  LV systolic function is normal with an LVEF of approximately 65%.  R-V systolic function is normal.  No pericardial effusion is seen.  IVC is normal. Dictated by:   Dietrich Pates, M.D. Attending Physician:  Carylon Perches DD:  06/04/02 TD:  06/08/02 Job: [id number]

## 2011-04-12 NOTE — Group Therapy Note (Signed)
[facility]  Patient:    LAYNE, LEBON Visit Number: [id number] MRN: [id number]          Service Type: MED Location: 2A A227 01 Attending Physician:  Carylon Perches Dictated by:   Butch Penny, M.D. Admit Date:  06/03/2002 Discharge Date: 06/06/2002                               Progress Note  BRIEF HISTORY:  This patient was admitted with near syncope.  She has had no further episodes of syncope and has had some palpitations.  Apparently had previous cardiolite study and catheterization which was some years ago and was negative.  PHYSICAL EXAMINATION:  VITAL SIGNS: Blood pressure 105/51, respirations 16, pulse 70, temperature 97.0.  LUNGS:  Clear to percussion and auscultation.  HEART:  Regular rhythm.  ABDOMEN: No palpable organs or masses.  ASSESSMENT: Patient admitted with near syncope of undetermined etiology.  PLan to continue to monitor orthostatic blood pressures etc. Dictated by:   Butch Penny, M.D. Attending Physician:  Carylon Perches DD:  06/05/02 TD:  06/08/02 Job: [id number]
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
## 2011-07-16 ENCOUNTER — Ambulatory Visit (INDEPENDENT_AMBULATORY_CARE_PROVIDER_SITE_OTHER): Payer: Medicare Other | Admitting: Gastroenterology

## 2011-07-16 ENCOUNTER — Encounter: Payer: Self-pay | Admitting: Gastroenterology

## 2011-07-16 VITALS — BP 121/75 | HR 81 | Temp 98.2°F | Ht 67.0 in | Wt 161.6 lb

## 2011-07-16 DIAGNOSIS — Q391 Atresia of esophagus with tracheo-esophageal fistula: Secondary | ICD-10-CM

## 2011-07-16 DIAGNOSIS — K6289 Other specified diseases of anus and rectum: Secondary | ICD-10-CM

## 2011-07-16 DIAGNOSIS — Q393 Congenital stenosis and stricture of esophagus: Secondary | ICD-10-CM

## 2011-07-16 DIAGNOSIS — K222 Esophageal obstruction: Secondary | ICD-10-CM | POA: Insufficient documentation

## 2011-07-16 NOTE — Assessment & Plan Note (Signed)
77 year old pleasant Caucasian female with hx of Schatzki's ring, s/p dilation March 2009. Hx of erosive esophagitis, taking Protonix prn. Progressing dysphagia since January, no odynophagia. Likely r/t ring, doubt other process. No wt loss or lack of appetite. We will proceed with EGD in near future with Dr. Jena Gauss.  Proceed with upper endoscopy/dilation in the near future with Dr. Jena Gauss. The risks, benefits, and alternatives have been discussed in detail with patient. They have stated understanding and desire to proceed.

## 2011-07-16 NOTE — Patient Instructions (Signed)
We have scheduled you for an endoscopy with Dr. Jena Gauss to look at your esophagus.  Continue Protonix as needed.  I have sent a refill for the Canasa suppositories as well. It would be good to take 1-2 X per week.

## 2011-07-16 NOTE — Assessment & Plan Note (Signed)
Diagnosed 2007. Was maintained on Canasa twice/week, which seemed to keep her in remission. She admits to not taking for one month. No abdominal pain, rectal bleeding, change in bowel habits. Resume twice/week. Refill provided.

## 2011-07-16 NOTE — Progress Notes (Signed)
Referring Provider: No ref. provider found Primary Care Physician:  Carylon Perches, MD  Chief Complaint  Patient presents with  . EGD    repeat/clears throat alot    HPI:   Kara Woods is a pleasant 77 year old Caucasian female who presents today in routine follow-up, and she has some concerns regarding progressive dysphagia. She has a hx of proctitis, found on colonoscopy in 2007. Has done well with Canasa suppositories now twice per week. However, she admits to not taking this for the past month. She denies any rectal bleeding, abdominal pain, change in bowel habits, diarrhea.  She also has a hx of Schatzki's ring, s/p dilation in 2009, as well as mild erosive esophagitis. When she was last seen in January, she was having occasional dysphagia and wanted to proceed with another EGD. However, she wanted to schedule this a few months out. It was never done, and she admits to worsening dysphagia since that time. No odynophagia. Rare nausea. Denies abdominal pain. She is alternating ibuprofen and tylenol. Takes Protonix as needed.    Past Medical History  Diagnosis Date  . GERD (gastroesophageal reflux disease)   . Proctitis     colonsocopy 2007, Canasa suppositories  . S/P endoscopy March 2009    mild erosive reflux esophagitis, Schatzki's ring, s/p dilation    Past Surgical History  Procedure Date  . Lumbar disc surgery 1993  . Cardiac catheterization 1998  . Retinal detachment surgery     Left    Current Outpatient Prescriptions  Medication Sig Dispense Refill  . acetaminophen (TYLENOL) 325 MG tablet Take 650 mg by mouth every 6 (six) hours as needed.        . clorazepate (TRANXENE) 7.5 MG tablet Take 7.5 mg by mouth 2 (two) times daily as needed.        Marland Kitchen ibuprofen (ADVIL,MOTRIN) 200 MG tablet Take 200 mg by mouth every 6 (six) hours as needed.        . latanoprost (XALATAN) 0.005 % ophthalmic solution       . LOTEMAX 0.5 % ophthalmic suspension       . mesalamine (CANASA) 1000 MG  suppository Place 1,000 mg rectally at bedtime.        . pantoprazole (PROTONIX) 40 MG tablet Take 40 mg by mouth daily.        . meloxicam (MOBIC) 7.5 MG tablet Take 7.5 mg by mouth daily.          Allergies as of 07/16/2011 - Review Complete 07/16/2011  Allergen Reaction Noted  . Sulfonamide derivatives  07/26/2008    Family History  Problem Relation Age of Onset  . Colon cancer Neg Hx     History   Social History  . Marital Status: Widowed    Spouse Name: N/A    Number of Children: N/A  . Years of Education: N/A   Social History Main Topics  . Smoking status: Former Smoker -- 1.0 packs/day    Types: Cigarettes  . Smokeless tobacco: None   Comment: quit 19 yrs ago  . Alcohol Use: Yes     a glass of wine once or twice a week  . Drug Use: No  . Sexually Active: None    Review of Systems: Gen: Denies fever, chills, anorexia. Denies fatigue, weakness, weight loss.  CV: Denies chest pain, palpitations, syncope, peripheral edema, and claudication. Resp: Denies dyspnea at rest, cough, wheezing, coughing up blood, and pleurisy. GI: Denies vomiting blood, jaundice, and fecal incontinence.   +dysphagia Derm:  Denies rash, itching, dry skin Psych: Denies depression, anxiety, memory loss, confusion. No homicidal or suicidal ideation.  Heme: Denies bruising, bleeding, and enlarged lymph nodes.  Physical Exam: BP 121/75  Pulse 81  Temp(Src) 98.2 F (36.8 C) (Temporal)  Ht 5\' 7"  (1.702 m)  Wt 161 lb 9.6 oz (73.301 kg)  BMI 25.31 kg/m2 General:   Alert and oriented. No distress noted. Pleasant and cooperative.  Head:  Normocephalic and atraumatic. Eyes:  Conjuctiva clear without scleral icterus. Mouth:  Oral mucosa pink and moist. Good dentition. No lesions. Neck:  Supple, without mass or thyromegaly. Heart:  S1, S2 present without murmurs, rubs, or gallops. Regular rate and rhythm. Abdomen:  +BS, soft, non-tender and non-distended. No rebound or guarding. No HSM or masses  noted. Msk:  Symmetrical without gross deformities. Normal posture. Extremities:  Without edema. Neurologic:  Alert and  oriented x4;  grossly normal neurologically. Skin:  Intact without significant lesions or rashes. Cervical Nodes:  No significant cervical adenopathy. Psych:  Alert and cooperative. Normal mood and affect.

## 2011-07-17 NOTE — Progress Notes (Signed)
Cc to PCP 

## 2011-07-26 MED ORDER — SODIUM CHLORIDE 0.45 % IV SOLN: Freq: Once | INTRAVENOUS | Status: DC

## 2011-07-30 ENCOUNTER — Ambulatory Visit (HOSPITAL_COMMUNITY): Admission: RE | Admit: 2011-07-30 | Payer: Medicare Other | Source: Ambulatory Visit | Admitting: Internal Medicine

## 2011-07-30 ENCOUNTER — Encounter (HOSPITAL_COMMUNITY): Admission: RE | Payer: Self-pay | Source: Ambulatory Visit

## 2011-07-30 SURGERY — EGD (ESOPHAGOGASTRODUODENOSCOPY)
Anesthesia: Moderate Sedation

## 2011-08-07 ENCOUNTER — Ambulatory Visit (HOSPITAL_COMMUNITY)
Admission: RE | Admit: 2011-08-07 | Discharge: 2011-08-07 | Disposition: A | Discharge status: Home / Self Care | Payer: Medicare Other | Source: Ambulatory Visit | Attending: Cardiovascular Disease | Admitting: Cardiovascular Disease

## 2011-08-07 ENCOUNTER — Other Ambulatory Visit (HOSPITAL_COMMUNITY): Payer: Self-pay | Admitting: Cardiovascular Disease

## 2011-08-07 DIAGNOSIS — R0602 Shortness of breath: Secondary | ICD-10-CM | POA: Insufficient documentation

## 2011-08-07 DIAGNOSIS — R0600 Dyspnea, unspecified: Secondary | ICD-10-CM

## 2011-08-07 IMAGING — CR DG CHEST 2V
2 series · 2 of 2 positions shown · non-contrast
Comparison: [DATE]

CLINICAL DATA: Shortness of breath

CHEST - 2 VIEW

[view not recorded (1 of 2)]
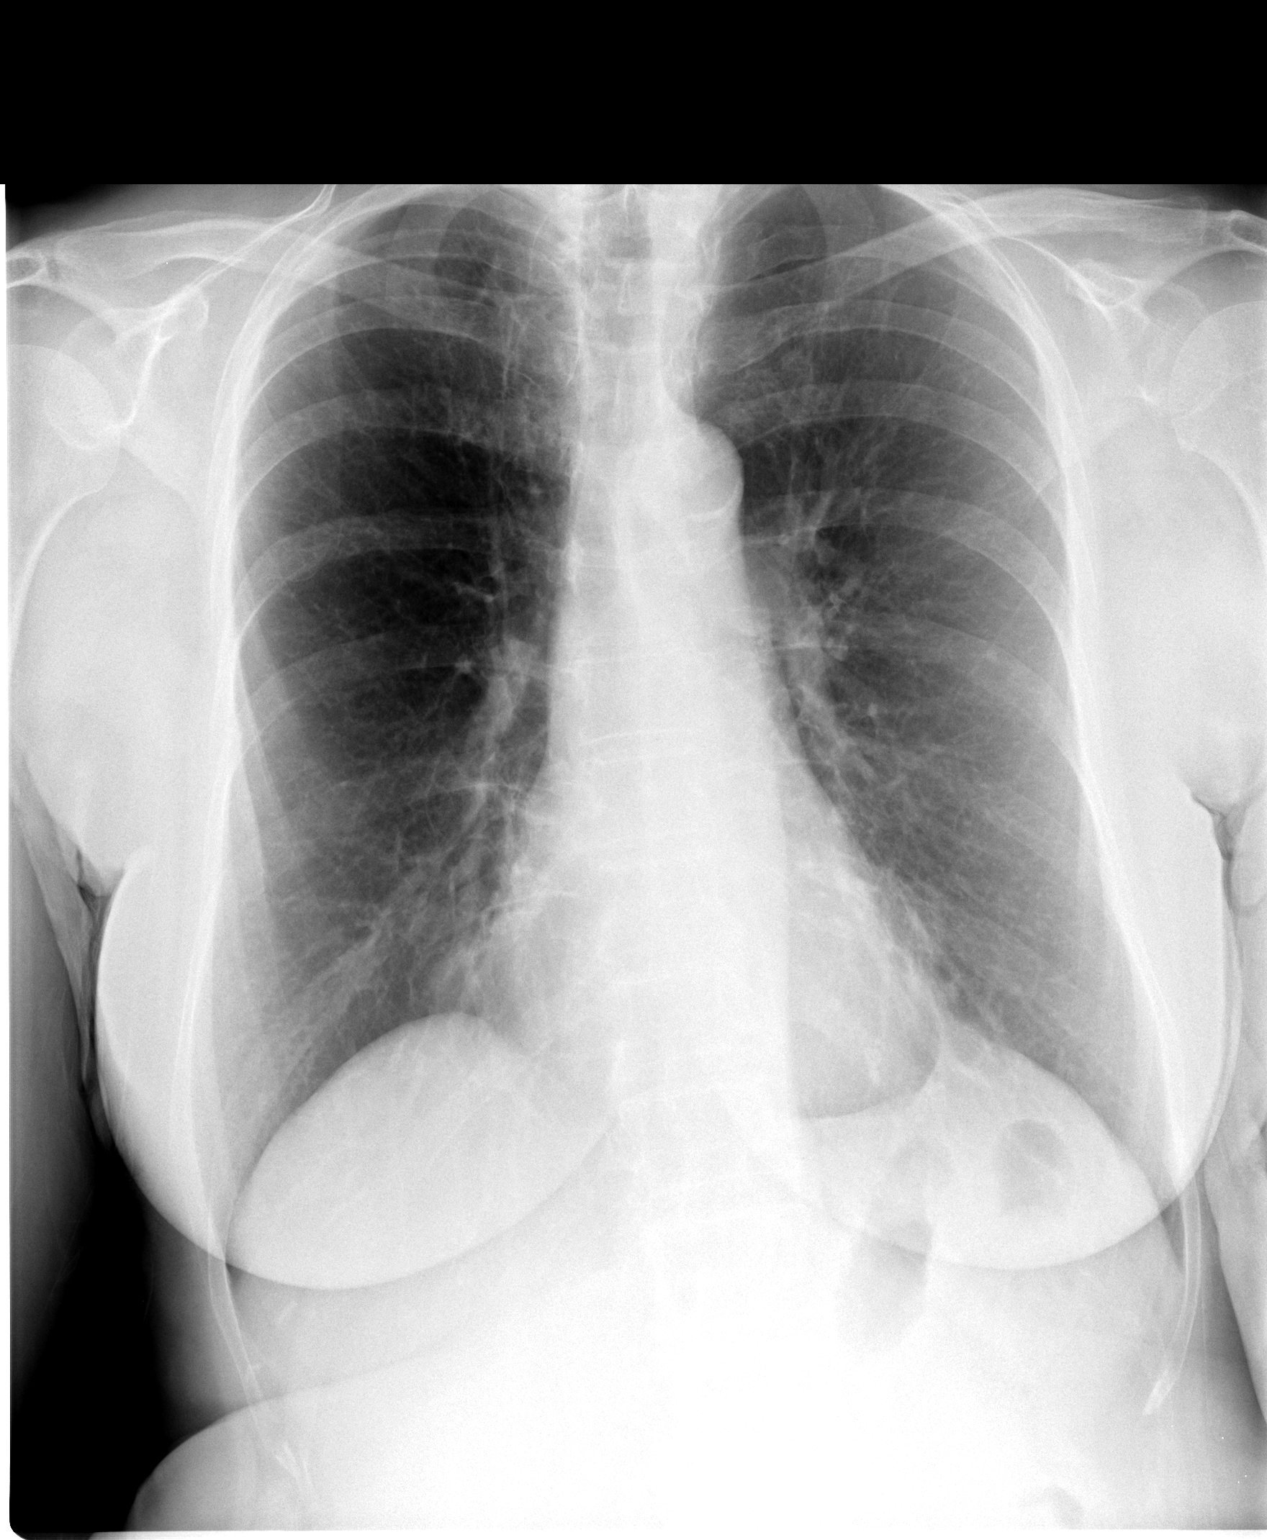

[view not recorded (2 of 2)]
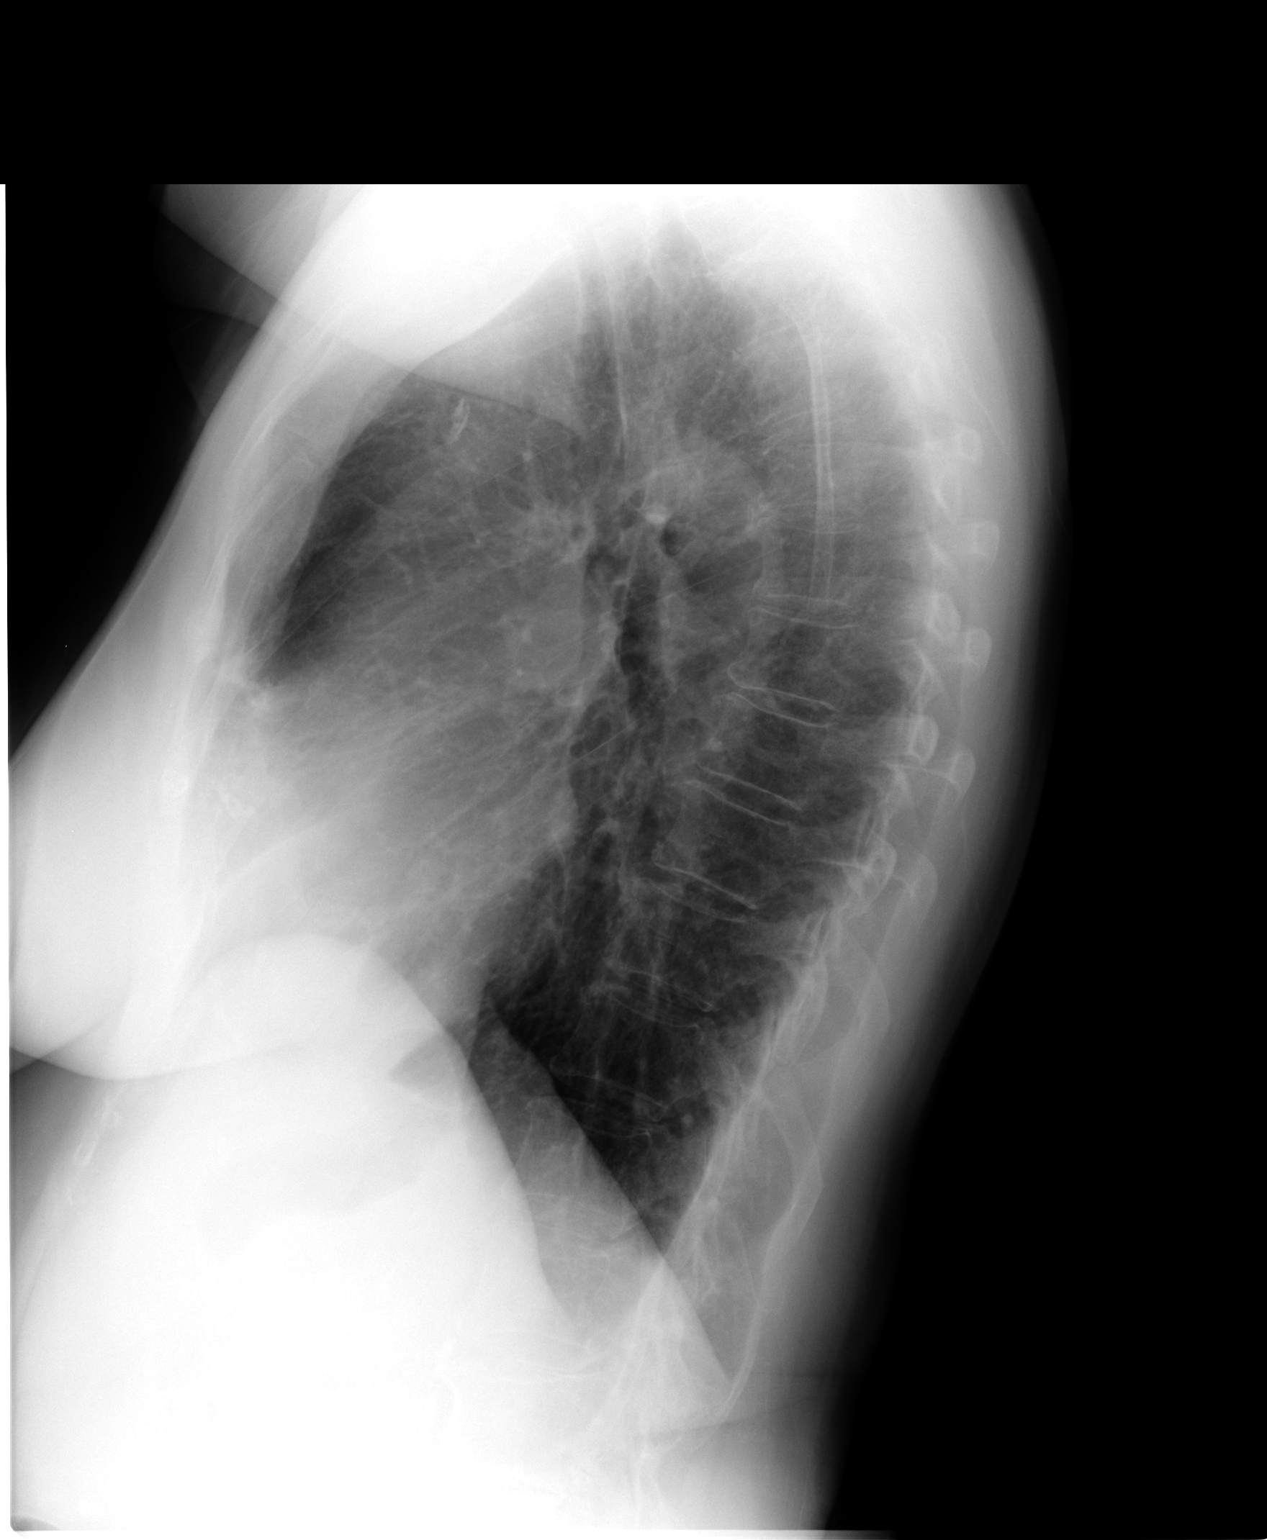

[2 of 2 positions shown; findings below may reference images not displayed]

FINDINGS: Hyperinflation with emphysematous changes. No pleural
effusion or pneumothorax.

Cardiomediastinal silhouette is within normal limits.

Mild degenerative changes of the visualized thoracolumbar spine.
IMPRESSION: No evidence of acute cardiopulmonary disease.

## 2011-08-15 ENCOUNTER — Ambulatory Visit (INDEPENDENT_AMBULATORY_CARE_PROVIDER_SITE_OTHER): Payer: Medicare Other | Admitting: Otolaryngology

## 2011-08-15 DIAGNOSIS — R070 Pain in throat: Secondary | ICD-10-CM

## 2011-08-15 DIAGNOSIS — K2100 Gastro-esophageal reflux disease with esophagitis, without bleeding: Secondary | ICD-10-CM

## 2011-08-15 DIAGNOSIS — H6120 Impacted cerumen, unspecified ear: Secondary | ICD-10-CM

## 2011-09-10 ENCOUNTER — Ambulatory Visit (HOSPITAL_COMMUNITY)
Admission: RE | Admit: 2011-09-10 | Discharge: 2011-09-10 | Disposition: A | Discharge status: Home / Self Care | Payer: Medicare Other | Source: Ambulatory Visit | Attending: Cardiovascular Disease | Admitting: Cardiovascular Disease

## 2011-09-10 DIAGNOSIS — R0602 Shortness of breath: Secondary | ICD-10-CM | POA: Insufficient documentation

## 2011-09-10 MED ORDER — ALBUTEROL SULFATE (5 MG/ML) 0.5% IN NEBU
2.5000 mg | INHALATION_SOLUTION | Freq: Once | RESPIRATORY_TRACT | Status: AC
  Administered 2011-09-10: 2.5 mg via RESPIRATORY_TRACT

## 2011-09-10 NOTE — Procedures (Signed)
NAMEMACRINA, LEHNERT              ACCOUNT NO.:  [id number]  MEDICAL RECORD NO.:  [id number]  LOCATION:  RESP                          FACILITY:  [facility]  PHYSICIAN:  Malaka Ruffner L. Juanetta Gosling, M.D.DATE OF BIRTH:  1933/03/22  DATE OF PROCEDURE:  09/10/2011 DATE OF DISCHARGE:                           PULMONARY FUNCTION TEST   REASON FOR PULMONARY FUNCTION TESTING:  Shortness of breath number.  1. Spirometry shows no ventilatory defect and no definite airflow     obstruction. 2. Lung volumes are normal. 3. DLCO is mildly reduced. 4. There is no significant bronchodilator improvement. 5. There is no definite cause of shortness of breath seen on this     pulmonary function test.     Ramon Dredge L. Juanetta Gosling, M.D.     ELH/MEDQ  D:  09/10/2011  T:  09/10/2011  Job:  [id number]  cc:   Nicki Guadalajara, M.D. Fax: [phone]

## 2011-11-27 DIAGNOSIS — H1010 Acute atopic conjunctivitis, unspecified eye: Secondary | ICD-10-CM | POA: Diagnosis not present

## 2011-11-28 DIAGNOSIS — R002 Palpitations: Secondary | ICD-10-CM | POA: Diagnosis not present

## 2011-11-28 DIAGNOSIS — E782 Mixed hyperlipidemia: Secondary | ICD-10-CM | POA: Diagnosis not present

## 2011-11-28 DIAGNOSIS — F411 Generalized anxiety disorder: Secondary | ICD-10-CM | POA: Diagnosis not present

## 2011-12-18 DIAGNOSIS — H04129 Dry eye syndrome of unspecified lacrimal gland: Secondary | ICD-10-CM | POA: Diagnosis not present

## 2011-12-31 DIAGNOSIS — H04129 Dry eye syndrome of unspecified lacrimal gland: Secondary | ICD-10-CM | POA: Diagnosis not present

## 2012-01-20 ENCOUNTER — Telehealth: Payer: Self-pay

## 2012-01-20 NOTE — Telephone Encounter (Signed)
Pt called- she has rx for Canasa Suppositories. Pt is changing insurance soon and wants to know if there is anything available in a generic that she could use. Please advise.

## 2012-01-21 MED ORDER — MESALAMINE 1000 MG RE SUPP: 1000.0000 mg | Freq: Every day | RECTAL | Status: DC

## 2012-01-21 NOTE — Telephone Encounter (Signed)
i reordered for twice per week, using generic.  Let's see what this costs at the pharmacy and go from there.

## 2012-01-30 DIAGNOSIS — H4011X0 Primary open-angle glaucoma, stage unspecified: Secondary | ICD-10-CM | POA: Diagnosis not present

## 2012-03-10 DIAGNOSIS — C44319 Basal cell carcinoma of skin of other parts of face: Secondary | ICD-10-CM | POA: Diagnosis not present

## 2012-03-10 DIAGNOSIS — D235 Other benign neoplasm of skin of trunk: Secondary | ICD-10-CM | POA: Diagnosis not present

## 2012-03-23 DIAGNOSIS — R0609 Other forms of dyspnea: Secondary | ICD-10-CM | POA: Diagnosis not present

## 2012-03-23 DIAGNOSIS — R0989 Other specified symptoms and signs involving the circulatory and respiratory systems: Secondary | ICD-10-CM | POA: Diagnosis not present

## 2012-05-12 DIAGNOSIS — I10 Essential (primary) hypertension: Secondary | ICD-10-CM | POA: Diagnosis not present

## 2012-05-12 DIAGNOSIS — E782 Mixed hyperlipidemia: Secondary | ICD-10-CM | POA: Diagnosis not present

## 2012-05-12 DIAGNOSIS — R0989 Other specified symptoms and signs involving the circulatory and respiratory systems: Secondary | ICD-10-CM | POA: Diagnosis not present

## 2012-06-22 ENCOUNTER — Encounter: Payer: Self-pay | Admitting: Internal Medicine

## 2012-06-23 ENCOUNTER — Encounter: Payer: Self-pay | Admitting: Gastroenterology

## 2012-06-23 ENCOUNTER — Telehealth: Payer: Self-pay | Admitting: *Deleted

## 2012-06-23 ENCOUNTER — Ambulatory Visit (INDEPENDENT_AMBULATORY_CARE_PROVIDER_SITE_OTHER): Payer: 59 | Admitting: Gastroenterology

## 2012-06-23 VITALS — BP 105/61 | HR 73 | Temp 98.4°F | Ht 67.0 in | Wt 169.0 lb

## 2012-06-23 DIAGNOSIS — R197 Diarrhea, unspecified: Secondary | ICD-10-CM | POA: Diagnosis not present

## 2012-06-23 DIAGNOSIS — K6289 Other specified diseases of anus and rectum: Secondary | ICD-10-CM

## 2012-06-23 MED ORDER — ALIGN 4 MG PO CAPS: 4.0000 mg | ORAL_CAPSULE | Freq: Every day | ORAL | Status: DC

## 2012-06-23 MED ORDER — MESALAMINE 1000 MG RE SUPP: RECTAL | Status: DC

## 2012-06-23 NOTE — Progress Notes (Signed)
Faxed to PCP

## 2012-06-23 NOTE — Progress Notes (Addendum)
Primary Care Physician: Carylon Perches, MD  Primary Gastroenterologist:  Roetta Sessions, MD   Chief Complaint  Patient presents with  . Follow-up    HPI: Kara Woods is a 78 y.o. female here for f/u. Last seen in 06/2011. She has hx of proctitis, found on colonoscopy in 2007. Has been on Canasa suppositories twice per week chronically. She has hx of Schatzki's ring, s/p dialtion 2009, as well as mild ERE.    About 3-4 weeks ago she developed change in her bowels. She started having 5-6 stools daily. Denies any rectal bleeding. Denies ill contacts, recent antibiotics, travel abroad. Over the past several days her symptoms have improved. She's not having 3-4 stools daily. More formed stools. No abd cramps.  Has been taking canasa supp every day since symptoms started. She takes Ibuprofen 400mg  tid, several years. Denies weight loss.    Current Outpatient Prescriptions  Medication Sig Dispense Refill  . acetaminophen (TYLENOL) 325 MG tablet Take 650 mg by mouth every 6 (six) hours as needed.        . clorazepate (TRANXENE) 7.5 MG tablet Take 7.5 mg by mouth 2 (two) times daily as needed.        Marland Kitchen ibuprofen (ADVIL,MOTRIN) 200 MG tablet Take 200 mg by mouth every 6 (six) hours as needed.        . latanoprost (XALATAN) 0.005 % ophthalmic solution Place 1 drop into both eyes daily.       Marland Kitchen LOTEMAX 0.5 % ophthalmic suspension Place 1 drop into both eyes 4 (four) times daily.       . mesalamine (CANASA) 1000 MG suppository Place 1 suppository (1,000 mg total) rectally at bedtime. Twice per week  30 suppository  3  . pantoprazole (PROTONIX) 40 MG tablet Take 40 mg by mouth daily.          Allergies as of 06/23/2012 - Review Complete 06/23/2012  Allergen Reaction Noted  . Sulfonamide derivatives  07/26/2008    ROS:  General: Negative for anorexia, weight loss, fever, chills, fatigue, weakness. ENT: Negative for hoarseness, difficulty swallowing , nasal congestion. CV: Negative for chest pain,  angina, palpitations, dyspnea on exertion, peripheral edema.  Respiratory: Negative for dyspnea at rest, dyspnea on exertion, cough, sputum, wheezing.  GI: See history of present illness. GU:  Negative for dysuria, hematuria, urinary incontinence, urinary frequency, nocturnal urination.  Endo: Negative for unusual weight change.    Physical Examination:   BP 105/61  Pulse 73  Temp 98.4 F (36.9 C) (Temporal)  Ht 5\' 7"  (1.702 m)  Wt 169 lb (76.658 kg)  BMI 26.47 kg/m2 No LMP recorded. Patient is not currently having periods (Reason: Other).  General: Well-nourished, well-developed in no acute distress.  Eyes: No icterus. Mouth: Oropharyngeal mucosa moist and pink , no lesions erythema or exudate. Lungs: Clear to auscultation bilaterally.  Heart: Regular rate and rhythm, no murmurs rubs or gallops.  Abdomen: Bowel sounds are normal, nontender, nondistended, no hepatosplenomegaly or masses, no abdominal bruits or hernia , no rebound or guarding.   Extremities: No lower extremity edema. No clubbing or deformities. Neuro: Alert and oriented x 4   Skin: Warm and dry, no jaundice.   Psych: Alert and cooperative, normal mood and affect.

## 2012-06-23 NOTE — Telephone Encounter (Signed)
[place] pharmacy called to get clarification about a suppository order that was sent to them today.  Please return there call. Thanks.

## 2012-06-23 NOTE — Assessment & Plan Note (Addendum)
3-4 week history of change in bowels, diarrhea with increased stool frequency and loose stool. Not associated with any blood. Suspect protracted viral illness however cannot exclude possibility of flare of proctitis. Discussed with patient, as she is doing better would continue to watch for now. Continue Canasa suppository at bedtime for 2 weeks. Samples provided. Then she will go back to twice weekly as before. Add Align one daily for 3 weeks. If her symptoms do not resolve, and she does not get back to her baseline, she will let us know. At that time would consider stool studies and/or colonoscopy.

## 2012-06-23 NOTE — Telephone Encounter (Signed)
Clarified rx with [facility]-Spoke with pharmacy and verified rx that was sent was correct.

## 2012-06-23 NOTE — Patient Instructions (Addendum)
Take Canasa suppository one per rectum each evening for the next 2 weeks. Then you may decrease to twice weekly. New prescription for Canasa sent to your pharmacy. Samples also provided today. Take Align one daily for 3 weeks. Samples provided. Please call us in 2-3 weeks if her symptoms have not dramatically improved and your bowels are not back to normal.

## 2012-06-30 DIAGNOSIS — N390 Urinary tract infection, site not specified: Secondary | ICD-10-CM | POA: Diagnosis not present

## 2012-07-10 ENCOUNTER — Ambulatory Visit (INDEPENDENT_AMBULATORY_CARE_PROVIDER_SITE_OTHER): Payer: Medicare Other | Admitting: Internal Medicine

## 2012-07-10 ENCOUNTER — Encounter: Payer: Self-pay | Admitting: Internal Medicine

## 2012-07-10 VITALS — BP 122/73 | HR 90 | Temp 97.5°F | Ht 67.0 in | Wt 170.4 lb

## 2012-07-10 DIAGNOSIS — R197 Diarrhea, unspecified: Secondary | ICD-10-CM

## 2012-07-10 DIAGNOSIS — K6289 Other specified diseases of anus and rectum: Secondary | ICD-10-CM

## 2012-07-10 NOTE — Progress Notes (Signed)
Primary Care Physician:  Carylon Perches, MD Primary Gastroenterologist:  Dr. Jena Gauss  Pre-Procedure History & Physical: HPI:  Kara Woods is a 78 y.o. female here for evaluation of a several week history of diarrhea and some tenesmus. No rectal bleeding. 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 No other change in medical regimen. No antibiotics predating onset of symptoms. Ibuprofen chronically for years but has done well with this combination. Last colonoscopy 2007 demonstrated proctitis. No dysphagia after having her Schatzki's ring dilated previous.  Finishing up her current prescription of Canasa as can no longer afford it. [name] is now gone up in price from  $20 to 600.   Past Medical History  Diagnosis Date  . GERD (gastroesophageal reflux disease)   . Proctitis     colonsocopy 2007, Canasa suppositories  . S/P endoscopy March 2009    mild erosive reflux esophagitis, Schatzki's ring, s/p dilation  . Schatzki's ring   . Colitis     Past Surgical History  Procedure Date  . Lumbar disc surgery 1993  . Cardiac catheterization 1998  . Retinal detachment surgery     Left  . Colonoscopy 05/06/2006    Diffuse inflammatory changes of the rectal mucosa, consistent  with proctitis.  Otherwise, normal colon to terminal ileum  . Esophagogastroduodenoscopy 02/09/2008    Schatzki ring status post dilation/Distal esophageal erosion consistent with mild erosive reflux  esophagitis, otherwise unremarkable esophagus, normal stomach, D1, D2.    Prior to Admission medications   Medication Sig Start Date End Date Taking? Authorizing Provider  acetaminophen (TYLENOL) 325 MG tablet Take 650 mg by mouth every 6 (six) hours as needed.     Yes Historical Provider, MD  clorazepate (TRANXENE) 7.5 MG tablet Take 7.5 mg by mouth 2 (two) times daily as needed.     Yes Historical Provider, MD  ibuprofen (ADVIL,MOTRIN) 200 MG tablet Take 200 mg by mouth every 6  (six) hours as needed.     Yes Historical Provider, MD  latanoprost (XALATAN) 0.005 % ophthalmic solution Place 1 drop into both eyes daily.  07/05/11  Yes Historical Provider, MD  LOTEMAX 0.5 % ophthalmic suspension Place 1 drop into both eyes 4 (four) times daily.  05/07/11  Yes Historical Provider, MD  omeprazole (PRILOSEC) 20 MG capsule Take 20 mg by mouth daily.  07/06/12  Yes Historical Provider, MD  Probiotic Product (ALIGN) 4 MG CAPS Take 4 mg by mouth daily. 06/23/12  Yes Tiffany Kocher, PA  mesalamine (CANASA) 1000 MG suppository 1 per rectum twice weekly 06/23/12   Tiffany Kocher, PA    Allergies as of 07/10/2012 - Review Complete 07/10/2012  Allergen Reaction Noted  . Sulfonamide derivatives  07/26/2008    Family History  Problem Relation Age of Onset  . Colon cancer Neg Hx     History   Social History  . Marital Status: Widowed    Spouse Name: N/A    Number of Children: N/A  . Years of Education: N/A   Occupational History  . Not on file.   Social History Main Topics  . Smoking status: Former Smoker -- 1.0 packs/day    Types: Cigarettes  . Smokeless tobacco: Not on file   Comment: quit 19 yrs ago  . Alcohol Use: Yes     a glass of wine once or twice a week  . Drug Use: No  . Sexually Active: Not on file   Other Topics Concern  . Not  on file   Social History Narrative  . No narrative on file    Review of Systems: See HPI, otherwise negative ROS  Physical Exam: BP 122/73  Pulse 90  Temp 97.5 F (36.4 C) (Temporal)  Ht 5\' 7"  (1.702 m)  Wt 170 lb 6.4 oz (77.293 kg)  BMI 26.69 kg/m2 General:   Alert,  Well-developed, well-nourished, pleasant and cooperative in NAD Skin:  Intact without significant lesions or rashes. Eyes:  Sclera clear, no icterus.   Conjunctiva pink. Ears:  Normal auditory acuity. Nose:  No deformity, discharge,  or lesions. Mouth:  No deformity or lesions. Neck:  Supple; no masses or thyromegaly. No significant cervical  adenopathy. Lungs:  Clear throughout to auscultation.   No wheezes, crackles, or rhonchi. No acute distress. Heart:  Regular rate and rhythm; no murmurs, clicks, rubs,  or gallops. Abdomen: Non-distended, normal bowel sounds.  Soft and nontender without appreciable mass or hepatosplenomegaly.  Pulses:  Normal pulses noted. Extremities:  Without clubbing or edema.  Impression/Plan:  Several week history of nonbloody diarrhea; no proctitis. No antibiotic exposure predating onset of symptoms. Doubt ibuprofen as a contributing factor.  Infectious etiology needs to be ruled out. However, with a relatively low threshold for a repeat visiting her colon to determine the status of her inflammatory bowel disease. For now, will pursue a set of stool studies. She may stop Canasa. Utilize Imodium as needed for diarrhea not to exceed the label instructions. Further recommendations to follow.

## 2012-07-10 NOTE — Patient Instructions (Addendum)
Stool for giardia / O and P Culture, C Diff, lactoferrin  Colonoscopy to be scheduled if stool studies negative  You may stop Canasa. Utilize Imodium as needed for diarrhea-  not to exceed label instructions

## 2012-07-11 LAB — FECAL LACTOFERRIN, QUANT: Lactoferrin: POSITIVE

## 2012-07-13 LAB — GIARDIA ANTIGEN: Giardia Screen (EIA): NEGATIVE

## 2012-07-13 LAB — CLOSTRIDIUM DIFFICILE BY PCR: Toxigenic C. Difficile by PCR: NOT DETECTED

## 2012-07-14 ENCOUNTER — Other Ambulatory Visit: Payer: Self-pay | Admitting: Internal Medicine

## 2012-07-14 ENCOUNTER — Telehealth: Payer: Self-pay | Admitting: Internal Medicine

## 2012-07-14 DIAGNOSIS — R195 Other fecal abnormalities: Secondary | ICD-10-CM

## 2012-07-14 LAB — STOOL CULTURE

## 2012-07-14 NOTE — Telephone Encounter (Signed)
Pt is aware of her results and is aware that she needs to schedule tcs. Pt is moving in 2 weeks and wants to wait until after she moves to have tcs done. Leighann please schedule pt.   RMR- pt is requesting to use the pill prep. She stated she was able to use it the last time she had a tcs done. Is it ok to use pill prep?please advise.

## 2012-07-14 NOTE — Telephone Encounter (Signed)
Routing to RMR for results. 

## 2012-07-14 NOTE — Telephone Encounter (Signed)
Patient is scheduled w/RMR for TCS on Friday September 13 and patient is aware

## 2012-07-14 NOTE — Telephone Encounter (Signed)
If we wait until after she moves we will need a new 30 day H&P please advise

## 2012-07-14 NOTE — Telephone Encounter (Signed)
Patient is asking for Stool Culture Results please advise?

## 2012-07-15 NOTE — Telephone Encounter (Signed)
Dr. Jena Gauss Ms. Name is asking for the pill prep she says that's the only way she will do the procedure please advise?

## 2012-07-20 ENCOUNTER — Other Ambulatory Visit: Payer: Self-pay | Admitting: Internal Medicine

## 2012-07-20 MED ORDER — SOD PHOS MONO-SOD PHOS DIBASIC 1.102-0.398 G PO TABS: 32.0000 | ORAL_TABLET | ORAL | Status: DC

## 2012-07-20 NOTE — Progress Notes (Signed)
DONE

## 2012-07-20 NOTE — Progress Notes (Unsigned)
Pt is aware that she must drink extra fluids with osmoprep. Leighann please add to her instructions as well.   Per RMR-Ok on osmo- prep;please emphasize extra fluids above and beyond fluids related to prep to avoid dehydration  ----- Message ----- From: Evalee Mutton, LPN Sent: 9/60/4540 10:48 AM To: Corbin Ade, MDPt is scheduled for September 13th. Have you decided if pt can have osmoprep. Pt stated she will not have procedure if she can not use osmoprep. She said that is what she used last time.   ----- Message ----- From: Corbin Ade, MD Sent: 07/15/2012 9:25 AM To: Evalee Mutton, LPN What pill prep?   ----- Message ----- From: Evalee Mutton, LPN Sent: 9/81/1914 8:17 AM To: Corbin Ade, MD Pt wants to use pill prep. She said that is what she used last time. Is this ok?

## 2012-07-22 DIAGNOSIS — H40019 Open angle with borderline findings, low risk, unspecified eye: Secondary | ICD-10-CM | POA: Diagnosis not present

## 2012-07-23 NOTE — Progress Notes (Signed)
REVIEWED.  

## 2012-08-03 ENCOUNTER — Telehealth: Payer: Self-pay | Admitting: Internal Medicine

## 2012-08-03 NOTE — Telephone Encounter (Signed)
Pt wants to cancel her TCS. She is in the process of changing insurance companies and she wants to wait.

## 2012-08-03 NOTE — Telephone Encounter (Signed)
Patients procedure has been canceled.

## 2012-08-07 ENCOUNTER — Encounter (HOSPITAL_COMMUNITY): Admission: RE | Payer: Self-pay | Source: Ambulatory Visit

## 2012-08-07 ENCOUNTER — Ambulatory Visit (HOSPITAL_COMMUNITY): Admission: RE | Admit: 2012-08-07 | Payer: Medicare Other | Source: Ambulatory Visit | Admitting: Internal Medicine

## 2012-08-07 SURGERY — COLONOSCOPY
Anesthesia: Moderate Sedation

## 2012-08-11 DIAGNOSIS — IMO0002 Reserved for concepts with insufficient information to code with codable children: Secondary | ICD-10-CM | POA: Diagnosis not present

## 2012-08-18 DIAGNOSIS — IMO0002 Reserved for concepts with insufficient information to code with codable children: Secondary | ICD-10-CM | POA: Diagnosis not present

## 2012-09-18 DIAGNOSIS — Z23 Encounter for immunization: Secondary | ICD-10-CM | POA: Diagnosis not present

## 2012-09-18 DIAGNOSIS — IMO0002 Reserved for concepts with insufficient information to code with codable children: Secondary | ICD-10-CM | POA: Diagnosis not present

## 2012-09-28 ENCOUNTER — Ambulatory Visit (HOSPITAL_COMMUNITY)
Admission: RE | Admit: 2012-09-28 | Discharge: 2012-09-28 | Disposition: A | Discharge status: Home / Self Care | Payer: Medicare Other | Source: Ambulatory Visit | Attending: Internal Medicine | Admitting: Internal Medicine

## 2012-09-28 ENCOUNTER — Other Ambulatory Visit (HOSPITAL_COMMUNITY): Payer: Self-pay | Admitting: Internal Medicine

## 2012-09-28 DIAGNOSIS — M4120 Other idiopathic scoliosis, site unspecified: Secondary | ICD-10-CM | POA: Insufficient documentation

## 2012-09-28 DIAGNOSIS — IMO0002 Reserved for concepts with insufficient information to code with codable children: Secondary | ICD-10-CM | POA: Diagnosis not present

## 2012-09-28 DIAGNOSIS — M47817 Spondylosis without myelopathy or radiculopathy, lumbosacral region: Secondary | ICD-10-CM | POA: Diagnosis not present

## 2012-09-28 DIAGNOSIS — M5416 Radiculopathy, lumbar region: Secondary | ICD-10-CM

## 2012-09-28 DIAGNOSIS — M169 Osteoarthritis of hip, unspecified: Secondary | ICD-10-CM | POA: Diagnosis not present

## 2012-09-28 IMAGING — CR DG HIP COMPLETE 2+V*R*
3 series · 3 of 3 positions shown · non-contrast
Comparison: None.

CLINICAL DATA: Low back and right hip pain

RIGHT HIP - COMPLETE 2+ VIEW

[view not recorded (1 of 3)]
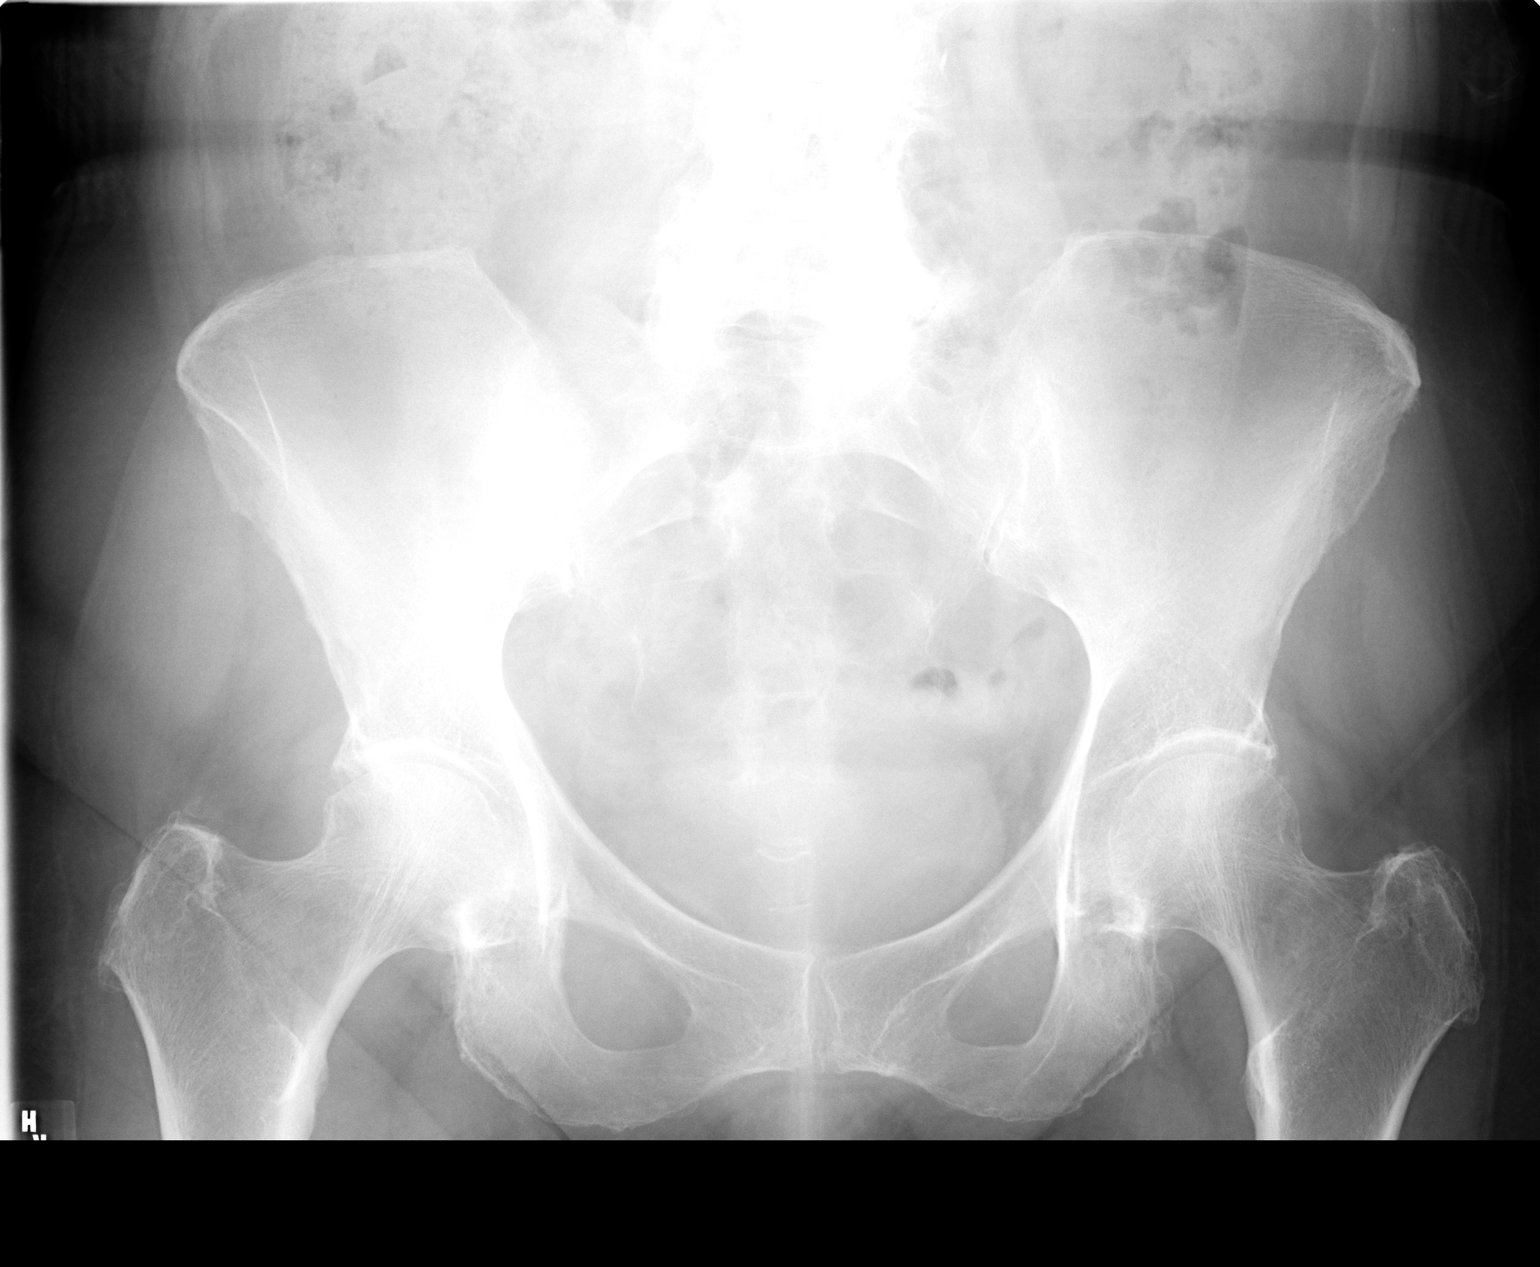

[view not recorded (2 of 3)]
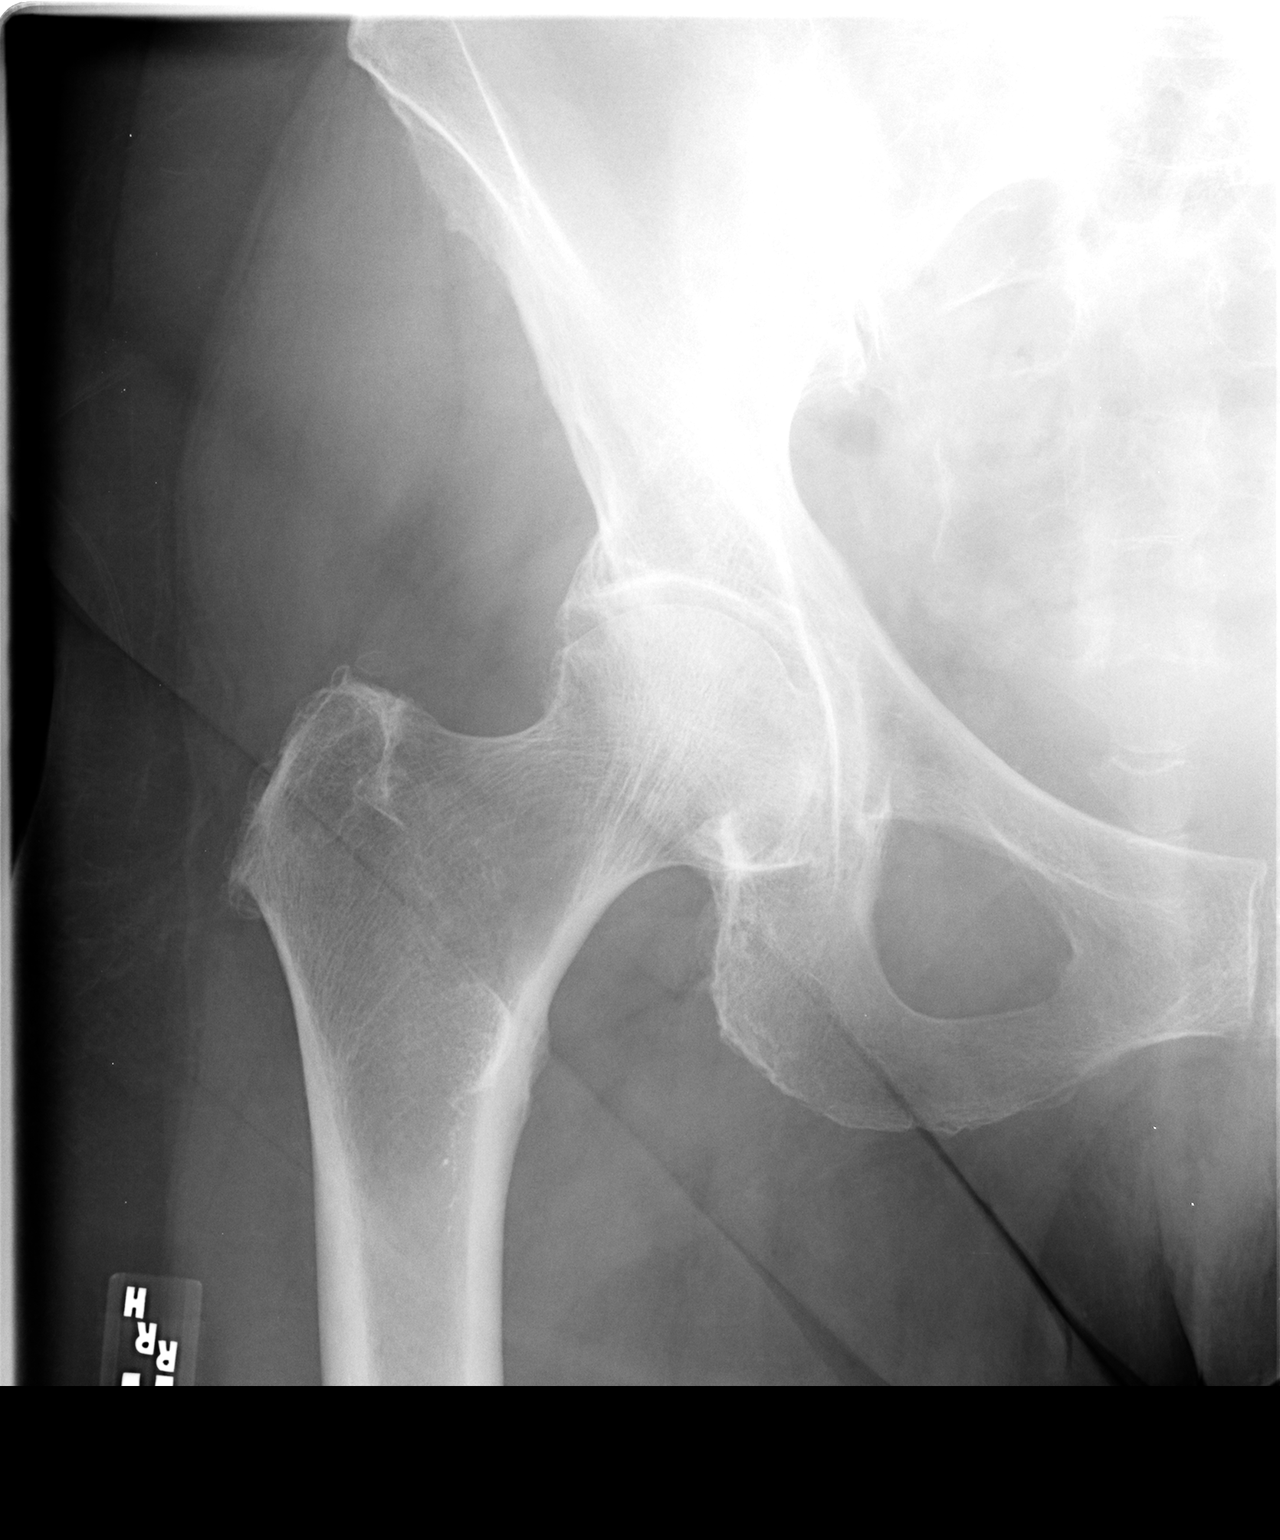

[view not recorded (3 of 3)]
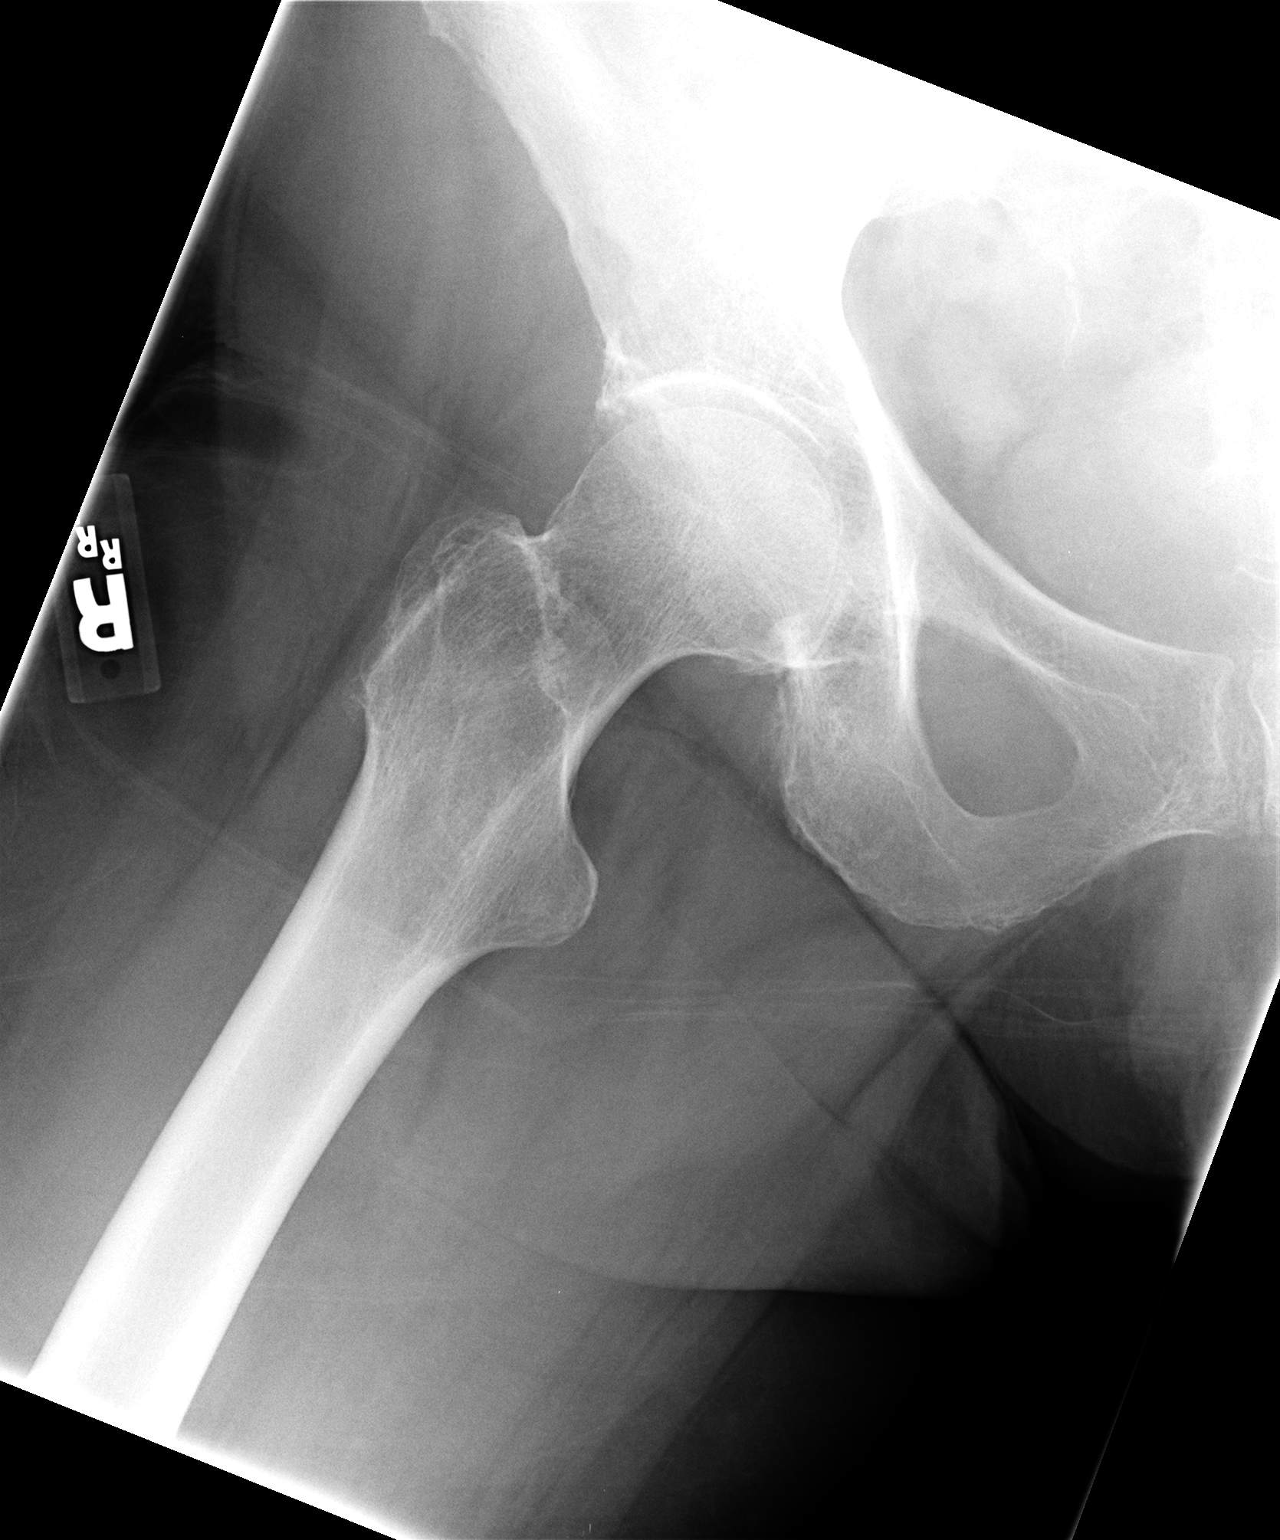

[3 of 3 positions shown; findings below may reference images not displayed]

FINDINGS: Degenerative changes of the hip joints are noted
bilaterally.  No acute fracture or dislocation is seen.  No soft
tissue abnormality is noted.
IMPRESSION: Degenerative change without acute abnormality.

## 2012-09-28 IMAGING — CR DG LUMBAR SPINE COMPLETE 4+V
5 series · 5 of 5 positions shown · non-contrast
Comparison: None.

CLINICAL DATA: Low back pain and right hip pain

LUMBAR SPINE - COMPLETE 4+ VIEW

[view not recorded (1 of 5)]
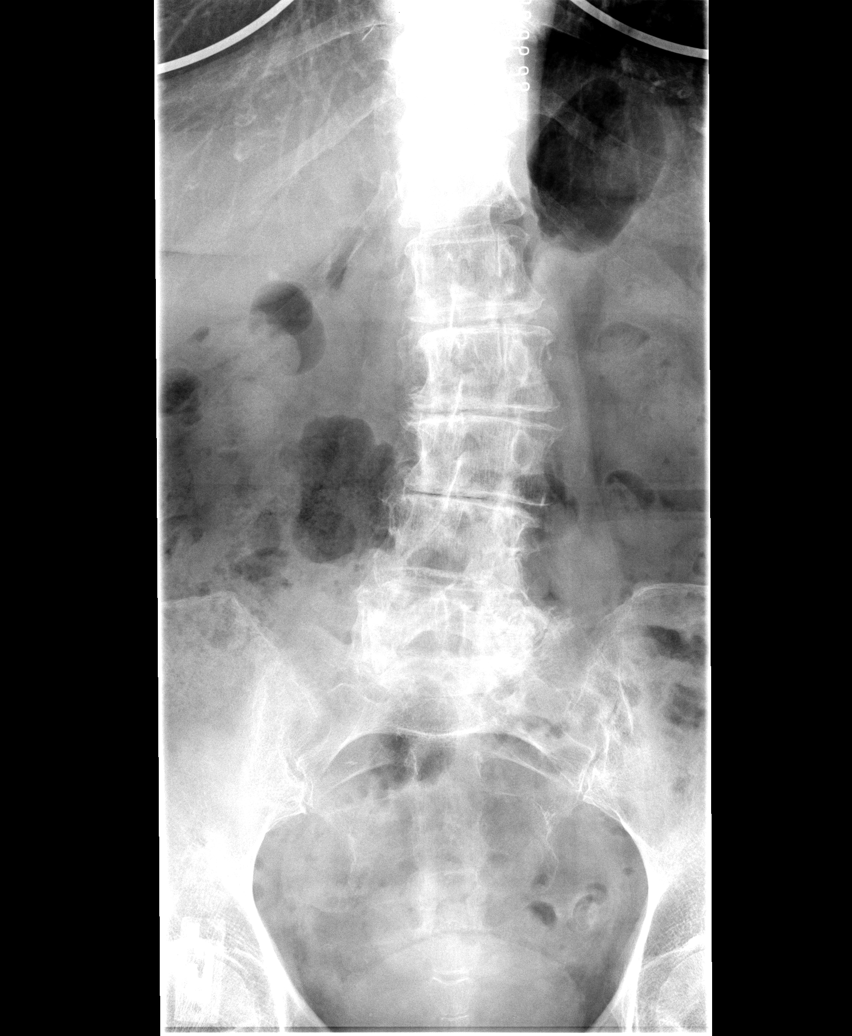

[view not recorded (2 of 5)]
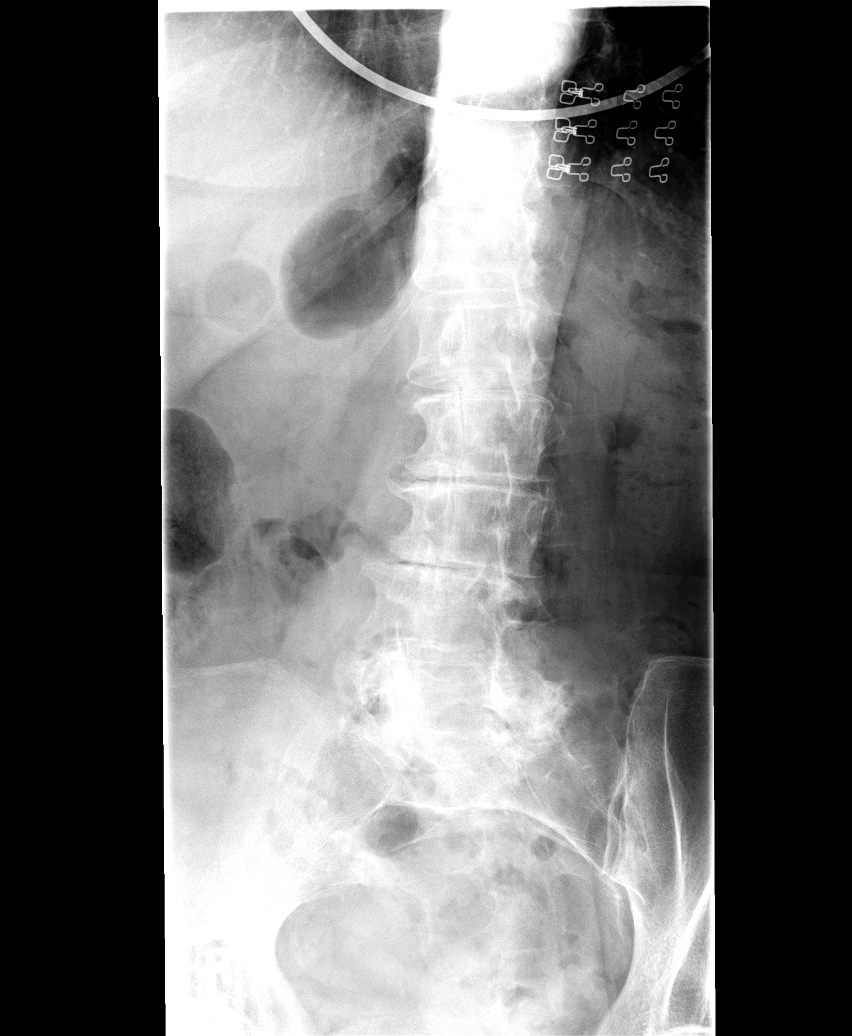

[view not recorded (3 of 5)]
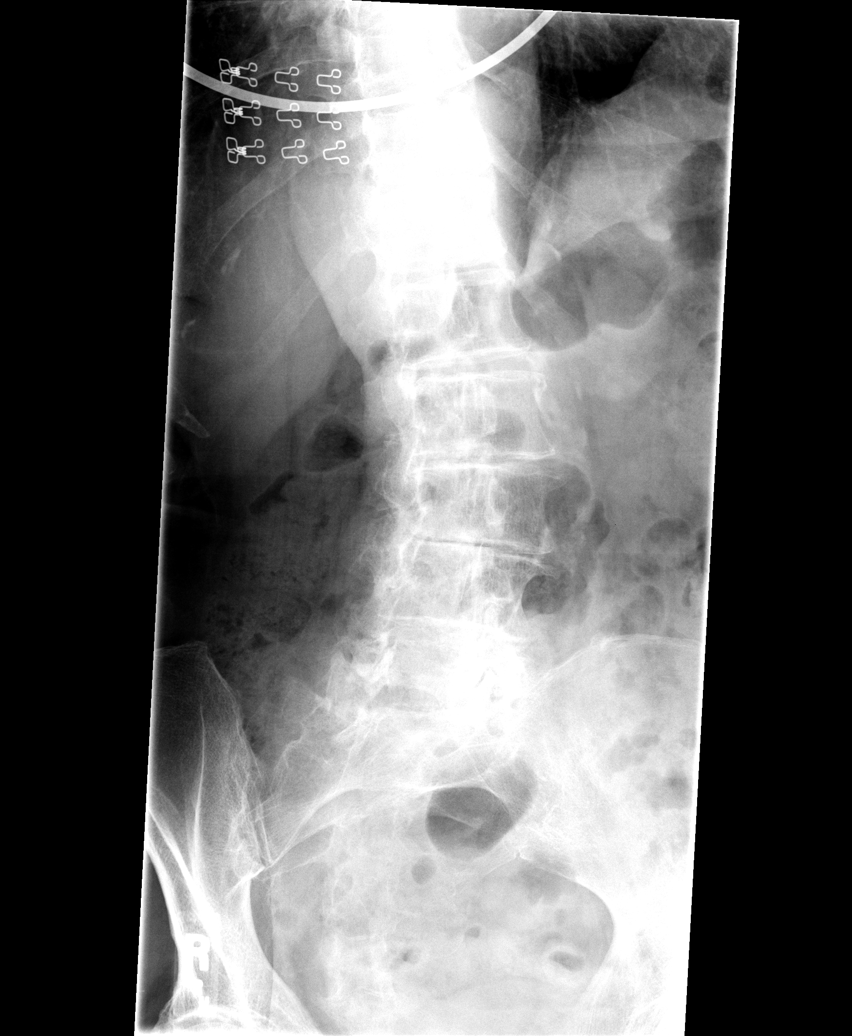

[view not recorded (4 of 5)]
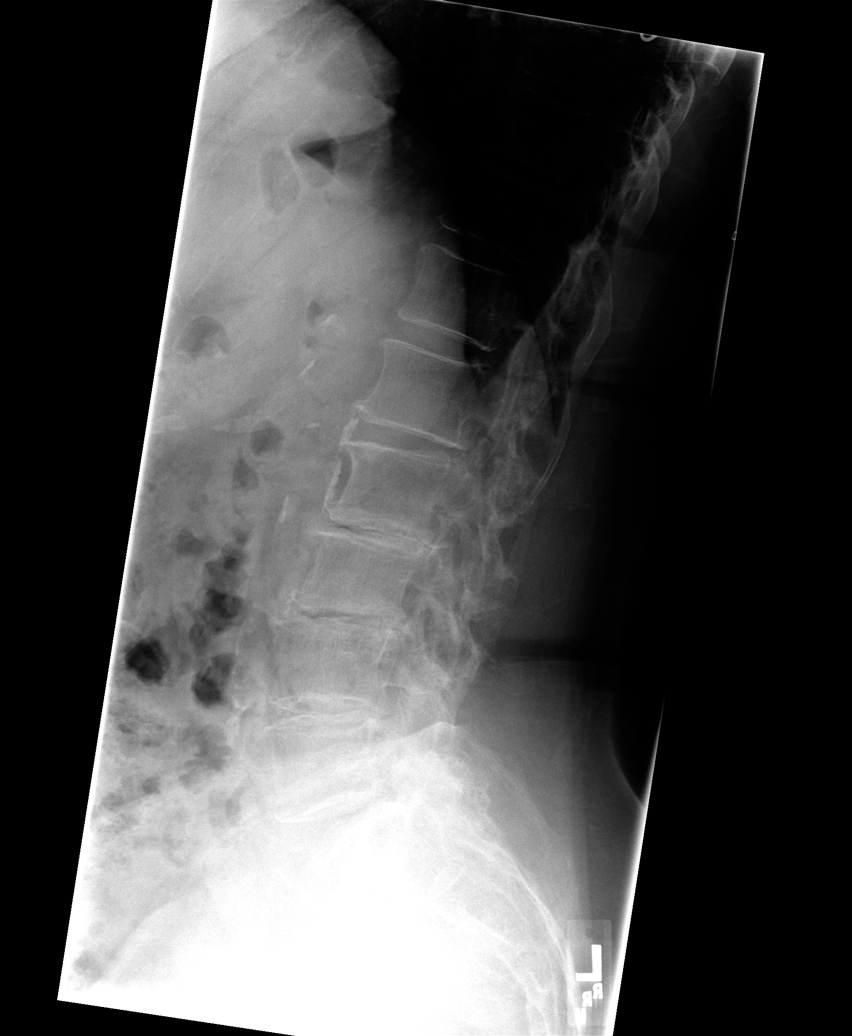

[view not recorded (5 of 5)]
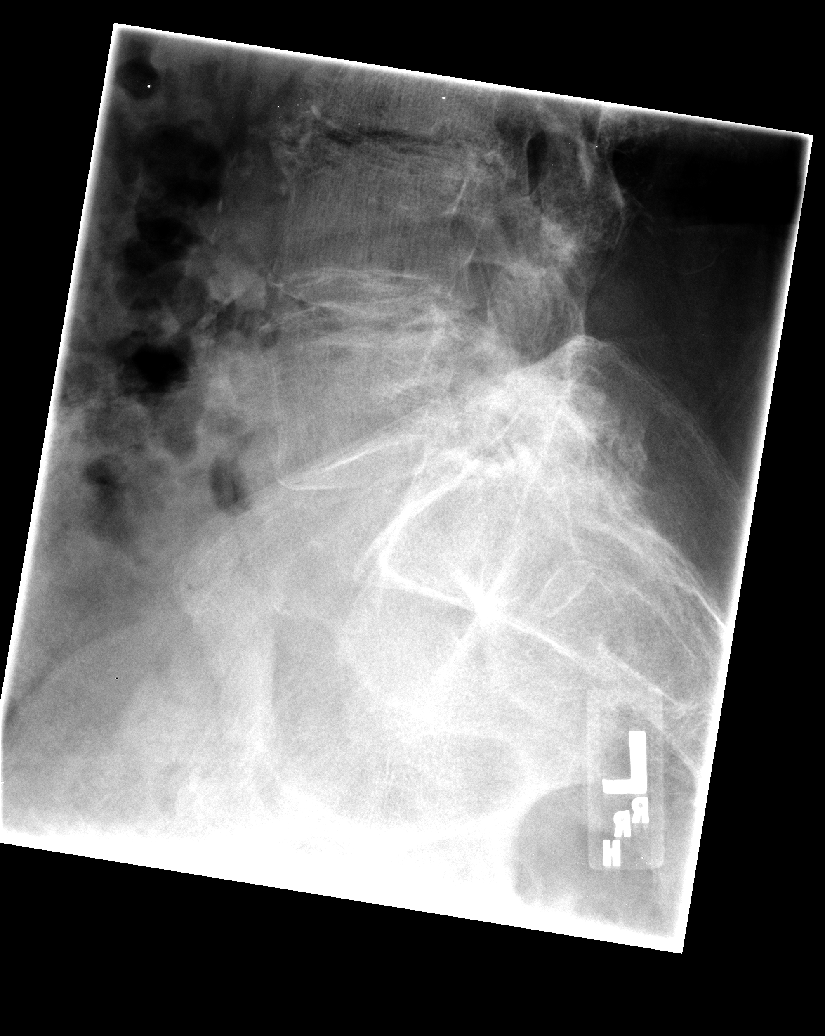

[5 of 5 positions shown; findings below may reference images not displayed]

FINDINGS: Five lumbar type vertebral bodies are well visualized.  A
mild scoliosis concave to the right is noted centered at the L2-3
interspace.  Vertebral body height is well-maintained.  Multilevel
disc space narrowing is noted from L2-L5.  Mild anterolisthesis of
L5 on S1 is noted likely of a degenerative nature as no definitive
pars defects are seen.  Hypertrophic facet changes are noted at
multiple levels.
IMPRESSION: Degenerative change without acute abnormality.

## 2012-12-08 DIAGNOSIS — H40019 Open angle with borderline findings, low risk, unspecified eye: Secondary | ICD-10-CM | POA: Diagnosis not present

## 2012-12-11 DIAGNOSIS — IMO0002 Reserved for concepts with insufficient information to code with codable children: Secondary | ICD-10-CM | POA: Diagnosis not present

## 2012-12-31 DIAGNOSIS — Z85828 Personal history of other malignant neoplasm of skin: Secondary | ICD-10-CM | POA: Diagnosis not present

## 2012-12-31 DIAGNOSIS — L719 Rosacea, unspecified: Secondary | ICD-10-CM | POA: Diagnosis not present

## 2013-01-13 DIAGNOSIS — M259 Joint disorder, unspecified: Secondary | ICD-10-CM | POA: Diagnosis not present

## 2013-01-13 DIAGNOSIS — M47817 Spondylosis without myelopathy or radiculopathy, lumbosacral region: Secondary | ICD-10-CM | POA: Diagnosis not present

## 2013-01-20 DIAGNOSIS — M5137 Other intervertebral disc degeneration, lumbosacral region: Secondary | ICD-10-CM | POA: Diagnosis not present

## 2013-01-20 DIAGNOSIS — M47817 Spondylosis without myelopathy or radiculopathy, lumbosacral region: Secondary | ICD-10-CM | POA: Diagnosis not present

## 2013-01-20 DIAGNOSIS — M4120 Other idiopathic scoliosis, site unspecified: Secondary | ICD-10-CM | POA: Diagnosis not present

## 2013-01-20 DIAGNOSIS — M5126 Other intervertebral disc displacement, lumbar region: Secondary | ICD-10-CM | POA: Diagnosis not present

## 2013-01-20 DIAGNOSIS — M48061 Spinal stenosis, lumbar region without neurogenic claudication: Secondary | ICD-10-CM | POA: Diagnosis not present

## 2013-02-12 ENCOUNTER — Other Ambulatory Visit (HOSPITAL_COMMUNITY): Payer: Self-pay | Admitting: Pulmonary Disease

## 2013-02-12 ENCOUNTER — Ambulatory Visit (HOSPITAL_COMMUNITY)
Admission: RE | Admit: 2013-02-12 | Discharge: 2013-02-12 | Disposition: A | Discharge status: Home / Self Care | Payer: Medicare Other | Source: Ambulatory Visit | Attending: Pulmonary Disease | Admitting: Pulmonary Disease

## 2013-02-12 DIAGNOSIS — M25569 Pain in unspecified knee: Secondary | ICD-10-CM | POA: Diagnosis not present

## 2013-02-12 DIAGNOSIS — S99919A Unspecified injury of unspecified ankle, initial encounter: Secondary | ICD-10-CM | POA: Insufficient documentation

## 2013-02-12 DIAGNOSIS — W19XXXA Unspecified fall, initial encounter: Secondary | ICD-10-CM

## 2013-02-12 DIAGNOSIS — S8990XA Unspecified injury of unspecified lower leg, initial encounter: Secondary | ICD-10-CM | POA: Diagnosis not present

## 2013-02-12 DIAGNOSIS — S99929A Unspecified injury of unspecified foot, initial encounter: Secondary | ICD-10-CM | POA: Insufficient documentation

## 2013-02-12 IMAGING — CR DG KNEE COMPLETE 4+V*L*
4 series · 4 of 4 positions shown · non-contrast
Comparison: None.

CLINICAL DATA: Pain post trauma

LEFT KNEE - COMPLETE 4+ VIEW

[view not recorded (1 of 4)]
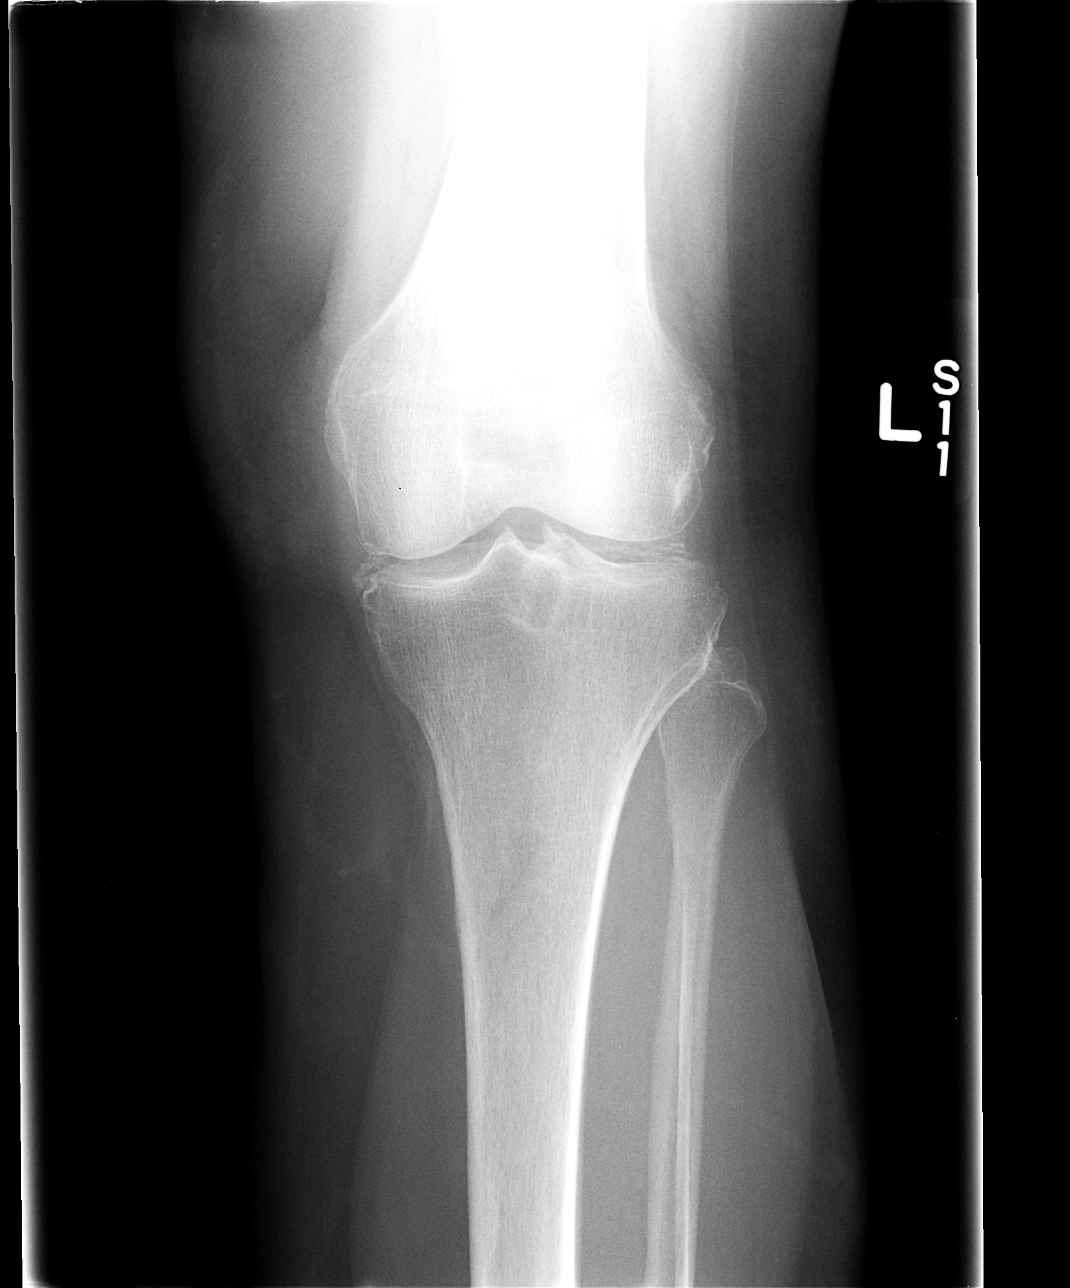

[view not recorded (2 of 4)]
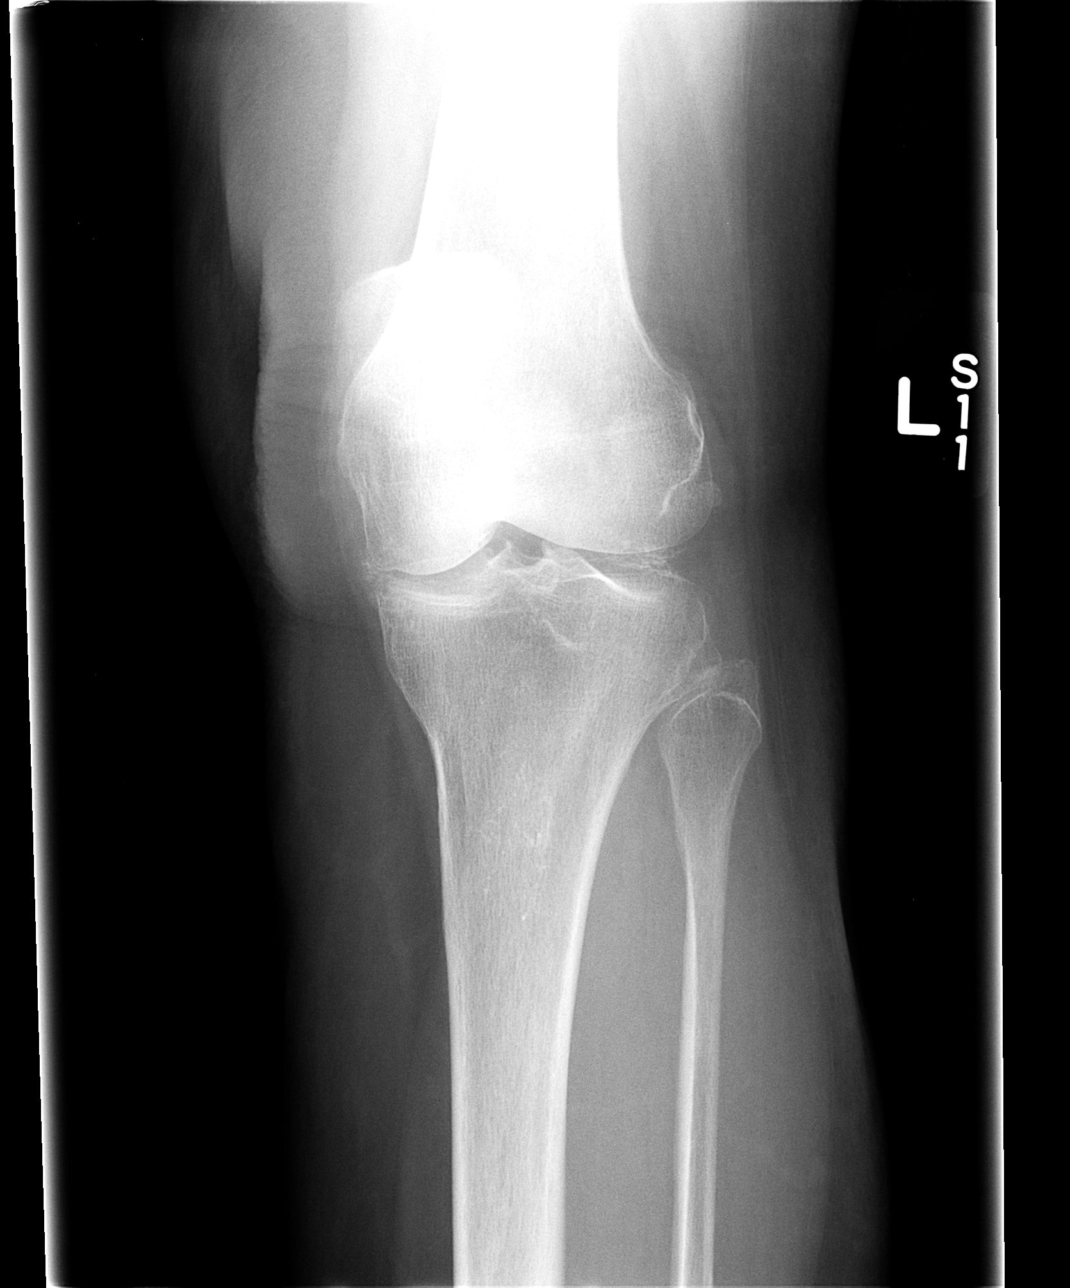

[view not recorded (3 of 4)]
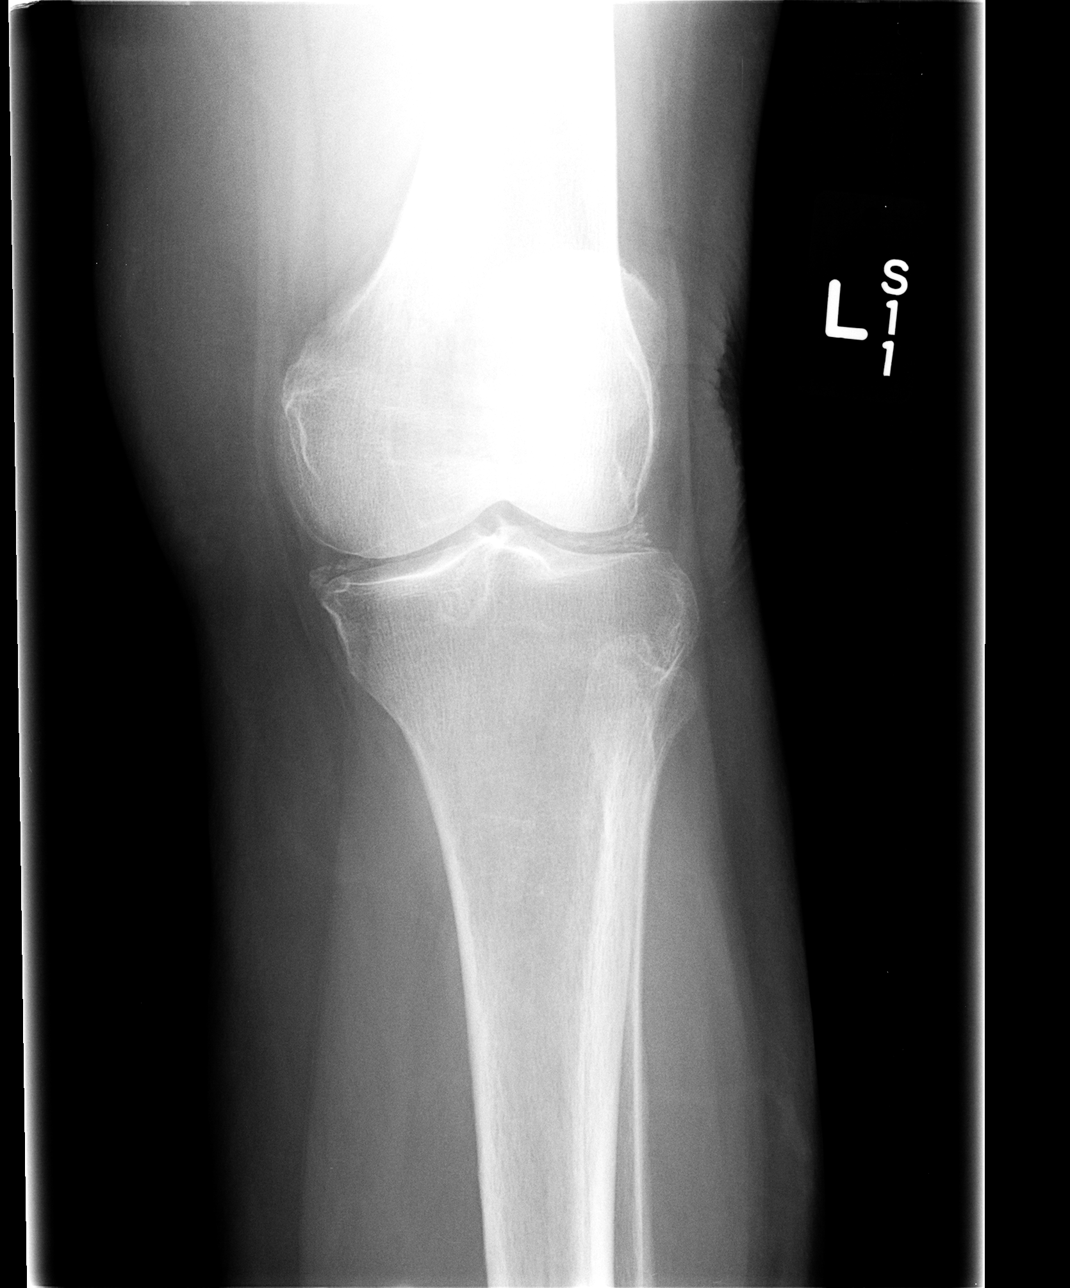

[view not recorded (4 of 4)]
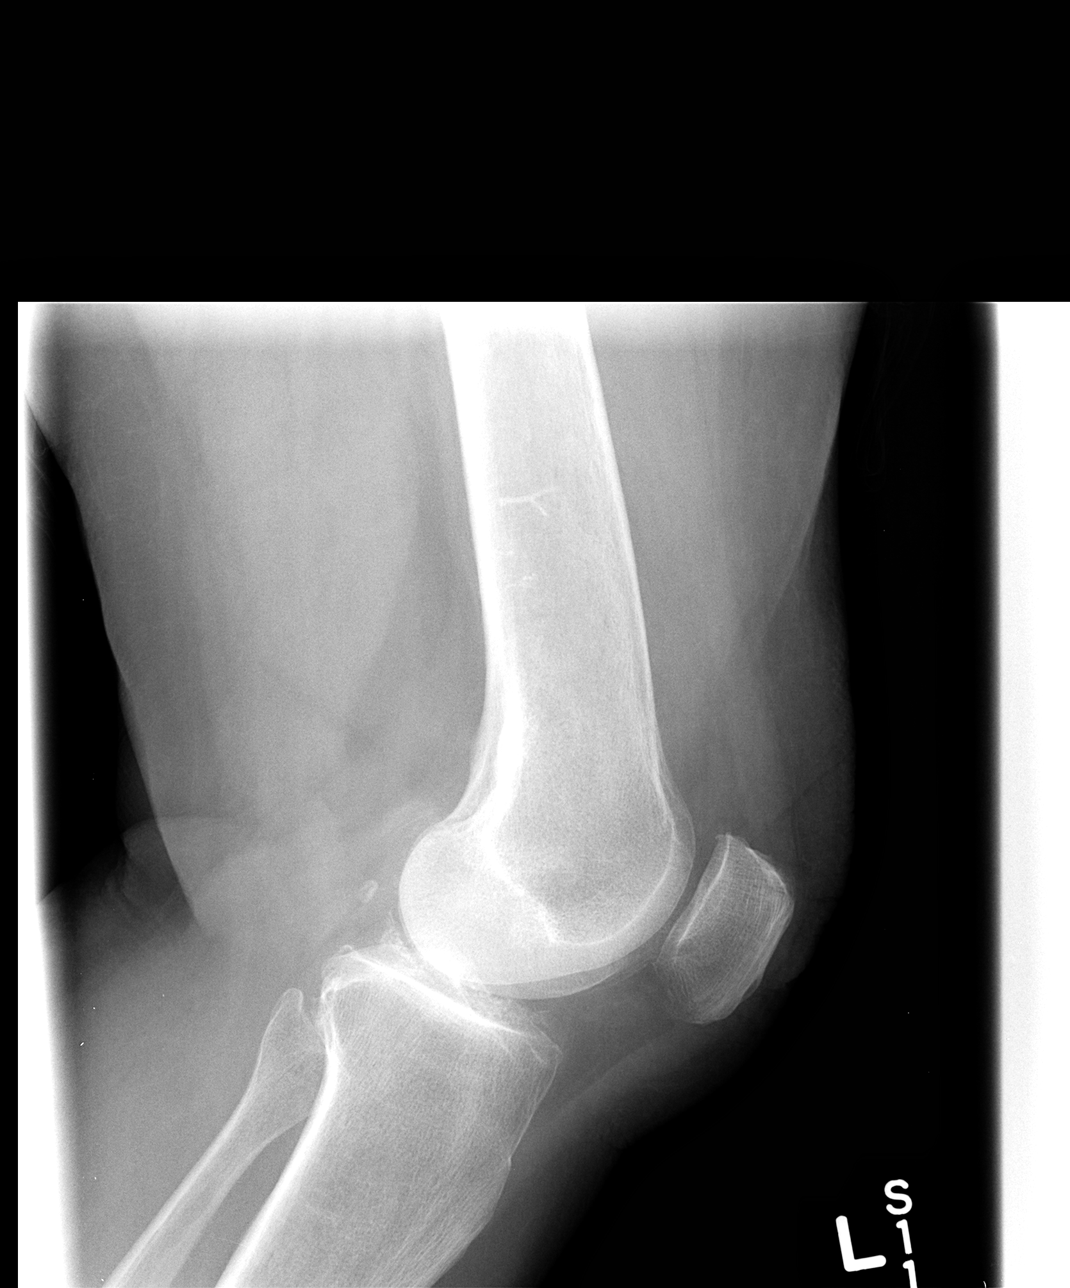

[4 of 4 positions shown; findings below may reference images not displayed]

FINDINGS: Frontal, lateral, and bilateral oblique views were
obtained.  There is no fracture, dislocation, or effusion.  There
is mild generalized joint space narrowing.  No erosive change.
There is extensive meniscal calcification.
IMPRESSION: Extensive meniscal calcification.  While this finding
may be seen as a function of osteoarthritis, it is also seen with
calcium pyrophosphate deposition disease which may represent
clinically as pseudogout.

No fracture or effusion.

## 2013-02-12 IMAGING — CR DG KNEE COMPLETE 4+V*R*
4 series · 4 of 4 positions shown · non-contrast
Comparison: None.

CLINICAL DATA: Pain post trauma

RIGHT KNEE - COMPLETE 4+ VIEW

[view not recorded (1 of 4)]
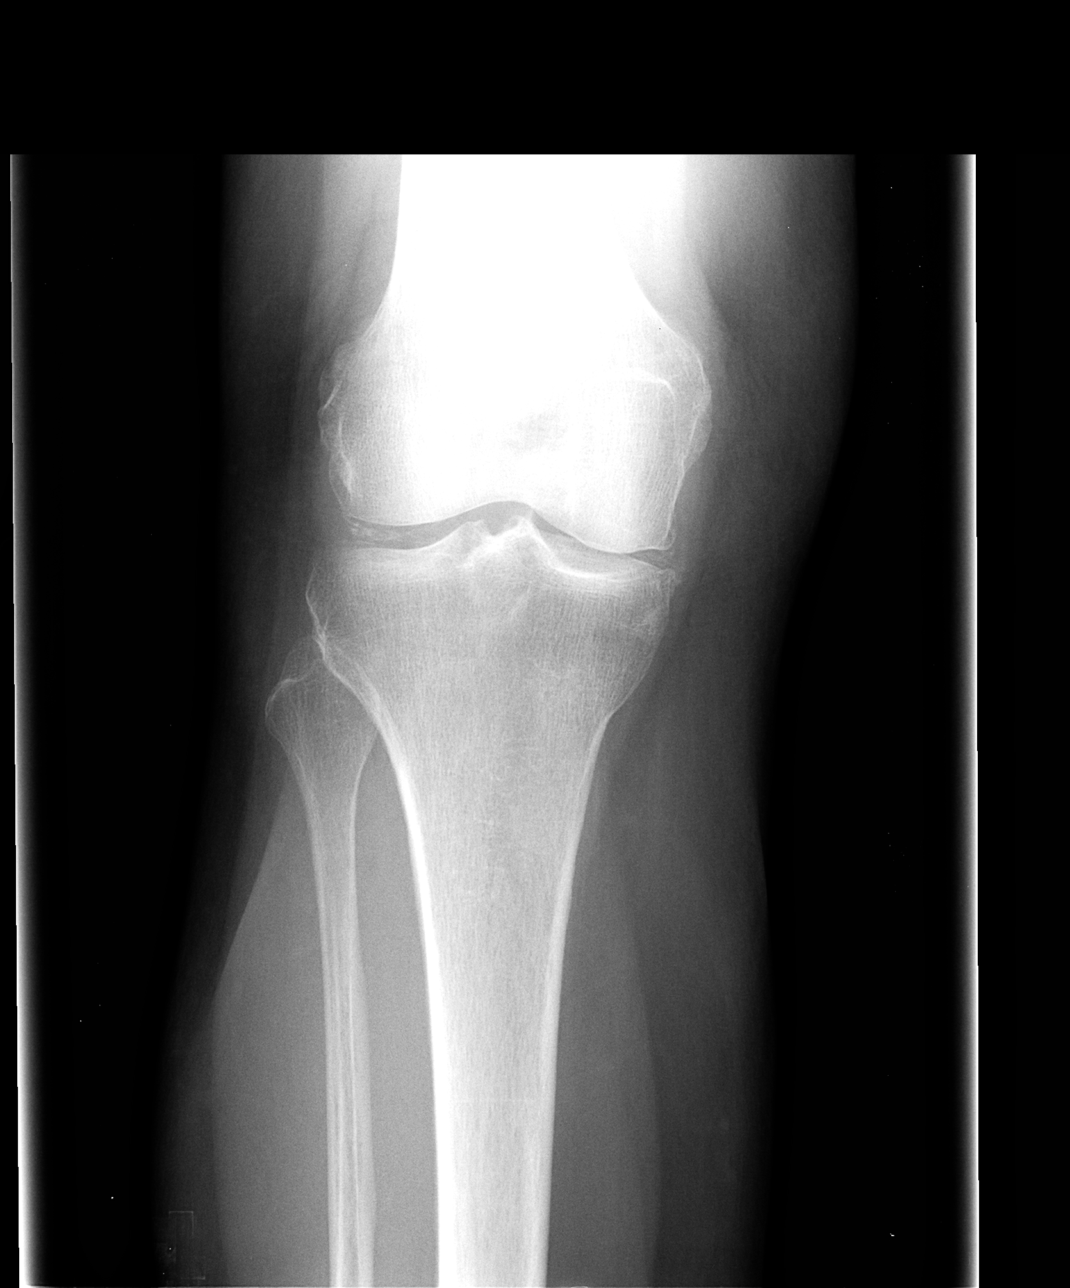

[view not recorded (2 of 4)]
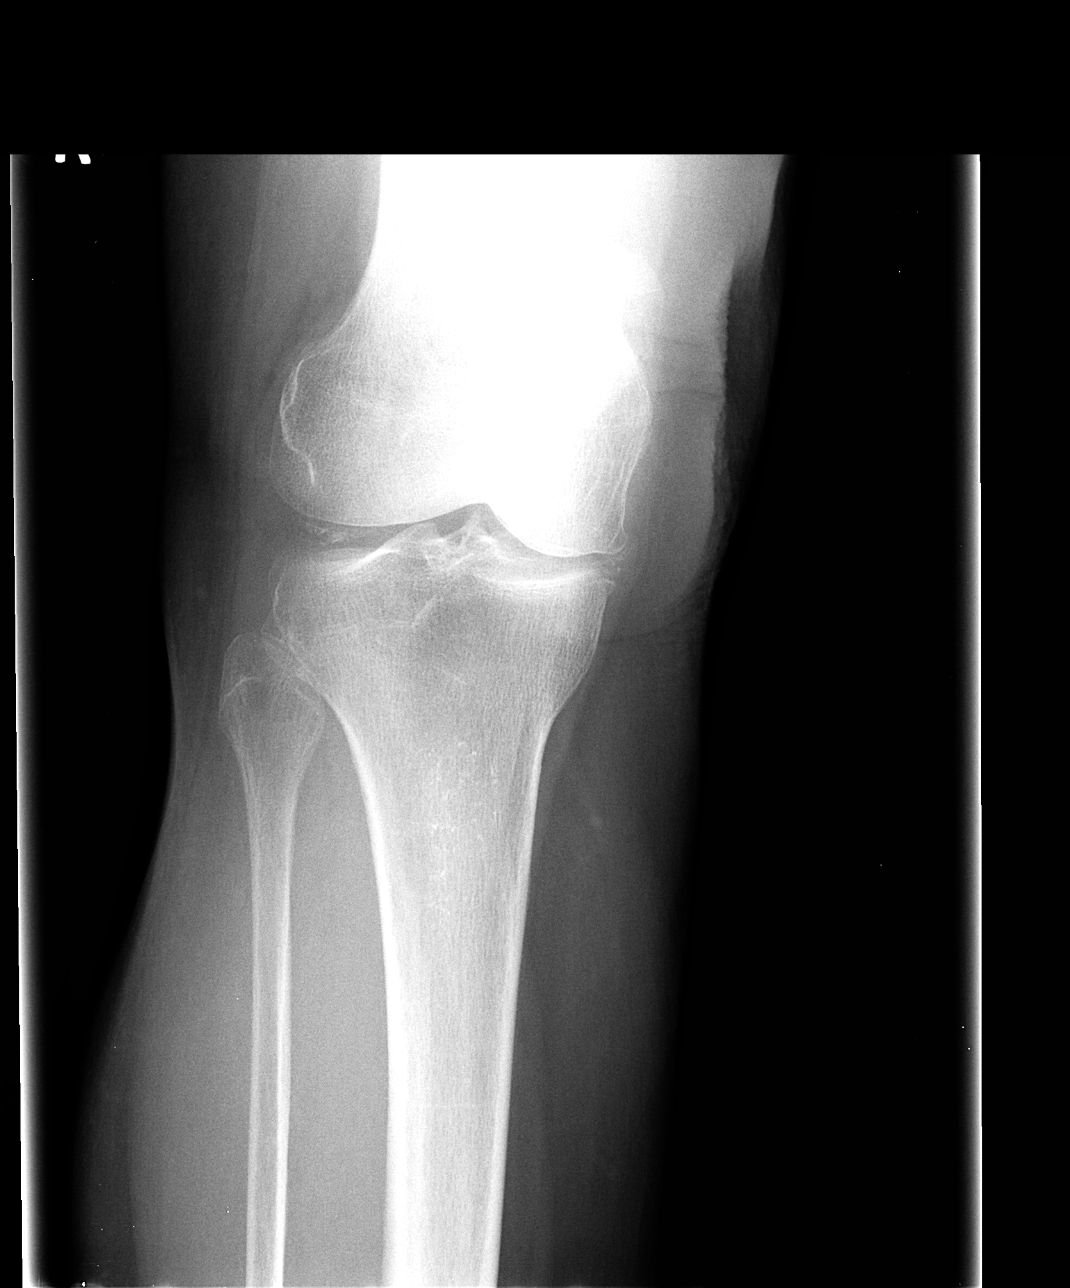

[view not recorded (3 of 4)]
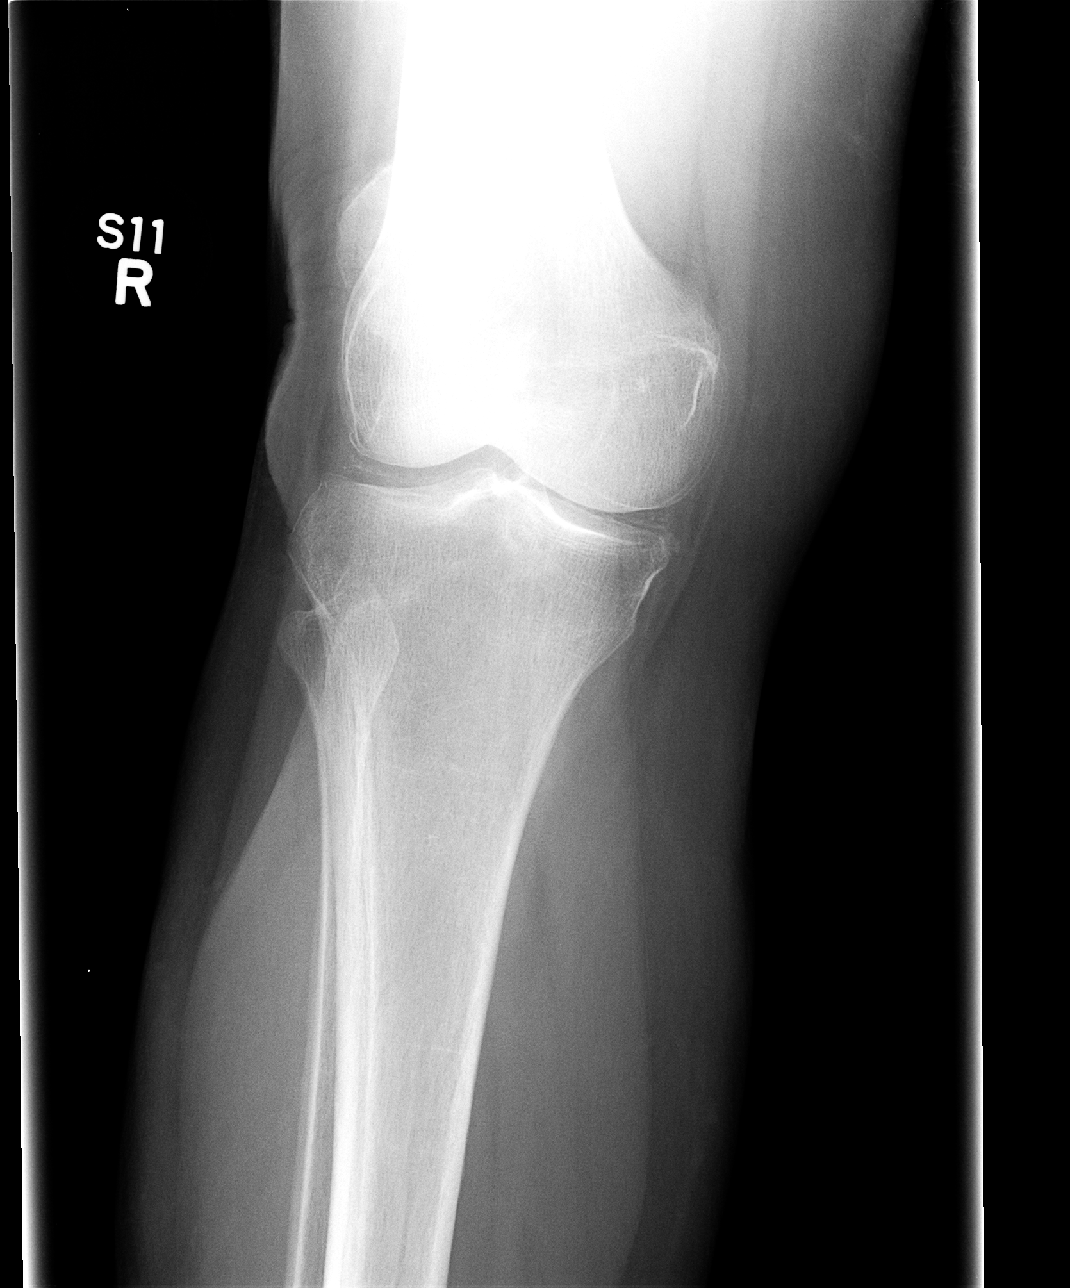

[view not recorded (4 of 4)]
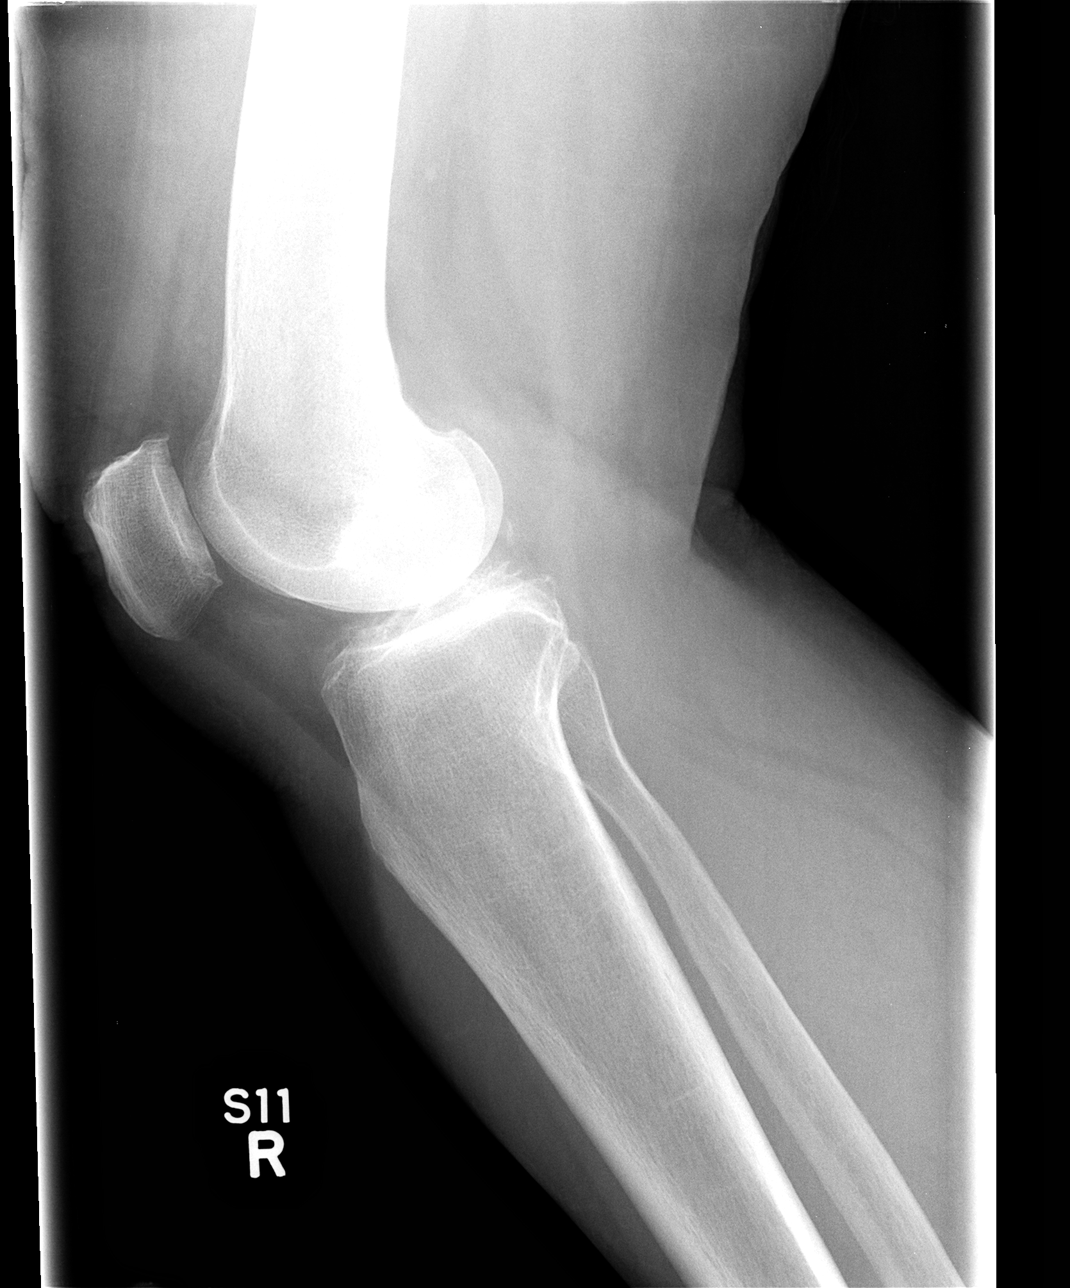

[4 of 4 positions shown; findings below may reference images not displayed]

FINDINGS: Frontal, lateral, and bilateral oblique views were
obtained.  There is no fracture, dislocation, or effusion.  There
is moderate joint space narrowing medially with spurring medially.
There is mild narrowing of the patellofemoral joint with mild
patellar spurring.

There is extensive meniscal calcification.  There is no erosive
change.
IMPRESSION: Osteoarthritic change, most prominent medially.
Extensive meniscal calcification. While this meniscal calcification
may be associated with osteoarthritis, and also may be associated
with calcium pyrophosphate deposition disease which may represent
clinically as pseudogout.

No fracture or effusion.

## 2013-02-17 DIAGNOSIS — H18519 Endothelial corneal dystrophy, unspecified eye: Secondary | ICD-10-CM | POA: Diagnosis not present

## 2013-03-24 DIAGNOSIS — F329 Major depressive disorder, single episode, unspecified: Secondary | ICD-10-CM | POA: Diagnosis not present

## 2013-04-07 DIAGNOSIS — H1820 Unspecified corneal edema: Secondary | ICD-10-CM | POA: Insufficient documentation

## 2013-04-07 DIAGNOSIS — H04123 Dry eye syndrome of bilateral lacrimal glands: Secondary | ICD-10-CM | POA: Insufficient documentation

## 2013-04-07 DIAGNOSIS — H2640 Unspecified secondary cataract: Secondary | ICD-10-CM | POA: Insufficient documentation

## 2013-04-07 DIAGNOSIS — H04129 Dry eye syndrome of unspecified lacrimal gland: Secondary | ICD-10-CM | POA: Diagnosis not present

## 2013-04-08 ENCOUNTER — Other Ambulatory Visit (HOSPITAL_COMMUNITY): Payer: Self-pay | Admitting: Internal Medicine

## 2013-04-08 DIAGNOSIS — N63 Unspecified lump in unspecified breast: Secondary | ICD-10-CM

## 2013-04-12 DIAGNOSIS — N63 Unspecified lump in unspecified breast: Secondary | ICD-10-CM | POA: Diagnosis not present

## 2013-04-13 DIAGNOSIS — C50219 Malignant neoplasm of upper-inner quadrant of unspecified female breast: Secondary | ICD-10-CM | POA: Diagnosis not present

## 2013-04-14 ENCOUNTER — Ambulatory Visit (HOSPITAL_COMMUNITY)
Admission: RE | Admit: 2013-04-14 | Discharge: 2013-04-14 | Disposition: A | Discharge status: Home / Self Care | Payer: Medicare Other | Source: Ambulatory Visit | Attending: Internal Medicine | Admitting: Internal Medicine

## 2013-04-14 ENCOUNTER — Other Ambulatory Visit (HOSPITAL_COMMUNITY): Payer: Self-pay | Admitting: Internal Medicine

## 2013-04-14 ENCOUNTER — Ambulatory Visit: Payer: Medicare Other

## 2013-04-14 DIAGNOSIS — IMO0002 Reserved for concepts with insufficient information to code with codable children: Secondary | ICD-10-CM

## 2013-04-14 DIAGNOSIS — N63 Unspecified lump in unspecified breast: Secondary | ICD-10-CM

## 2013-04-14 DIAGNOSIS — C50919 Malignant neoplasm of unspecified site of unspecified female breast: Secondary | ICD-10-CM | POA: Diagnosis not present

## 2013-04-14 IMAGING — MG MM DIGITAL DIAGNOSTIC UNILAT L
1 series · 1 of 1 positions shown · non-contrast
Comparison: Previous exams.

***ADDENDUM*** CREATED: [DATE] [DATE]

Pathologic results indicate the presence of invasive mammary
carcinoma, which is concordant with the imaging.  I gave this
information to the patient's CUDJO, at the patient's
request, by telephone on [DATE] at [F8].  The patient has been
experiencing some soreness at the biopsy site but is otherwise
doing well.
Per the patient's request, we made an appointment for her to see
Dr. CUDJO of [REDACTED] on [DATE] (the patient
specifically requested an appointment with a surgeon in that
practice).  I also discussed the results and referral with Dr.
CUDJO at [F8].
Addended by:  CUDJO, M.D. on [DATE] [DATE].
***END ADDENDUM*** SIGNED BY: CUDJO, M.D.
CLINICAL DATA: Status post ultrasound-guided core biopsy of the
left breast
DIGITAL DIAGNOSTIC LEFT MAMMOGRAM

[L ML]
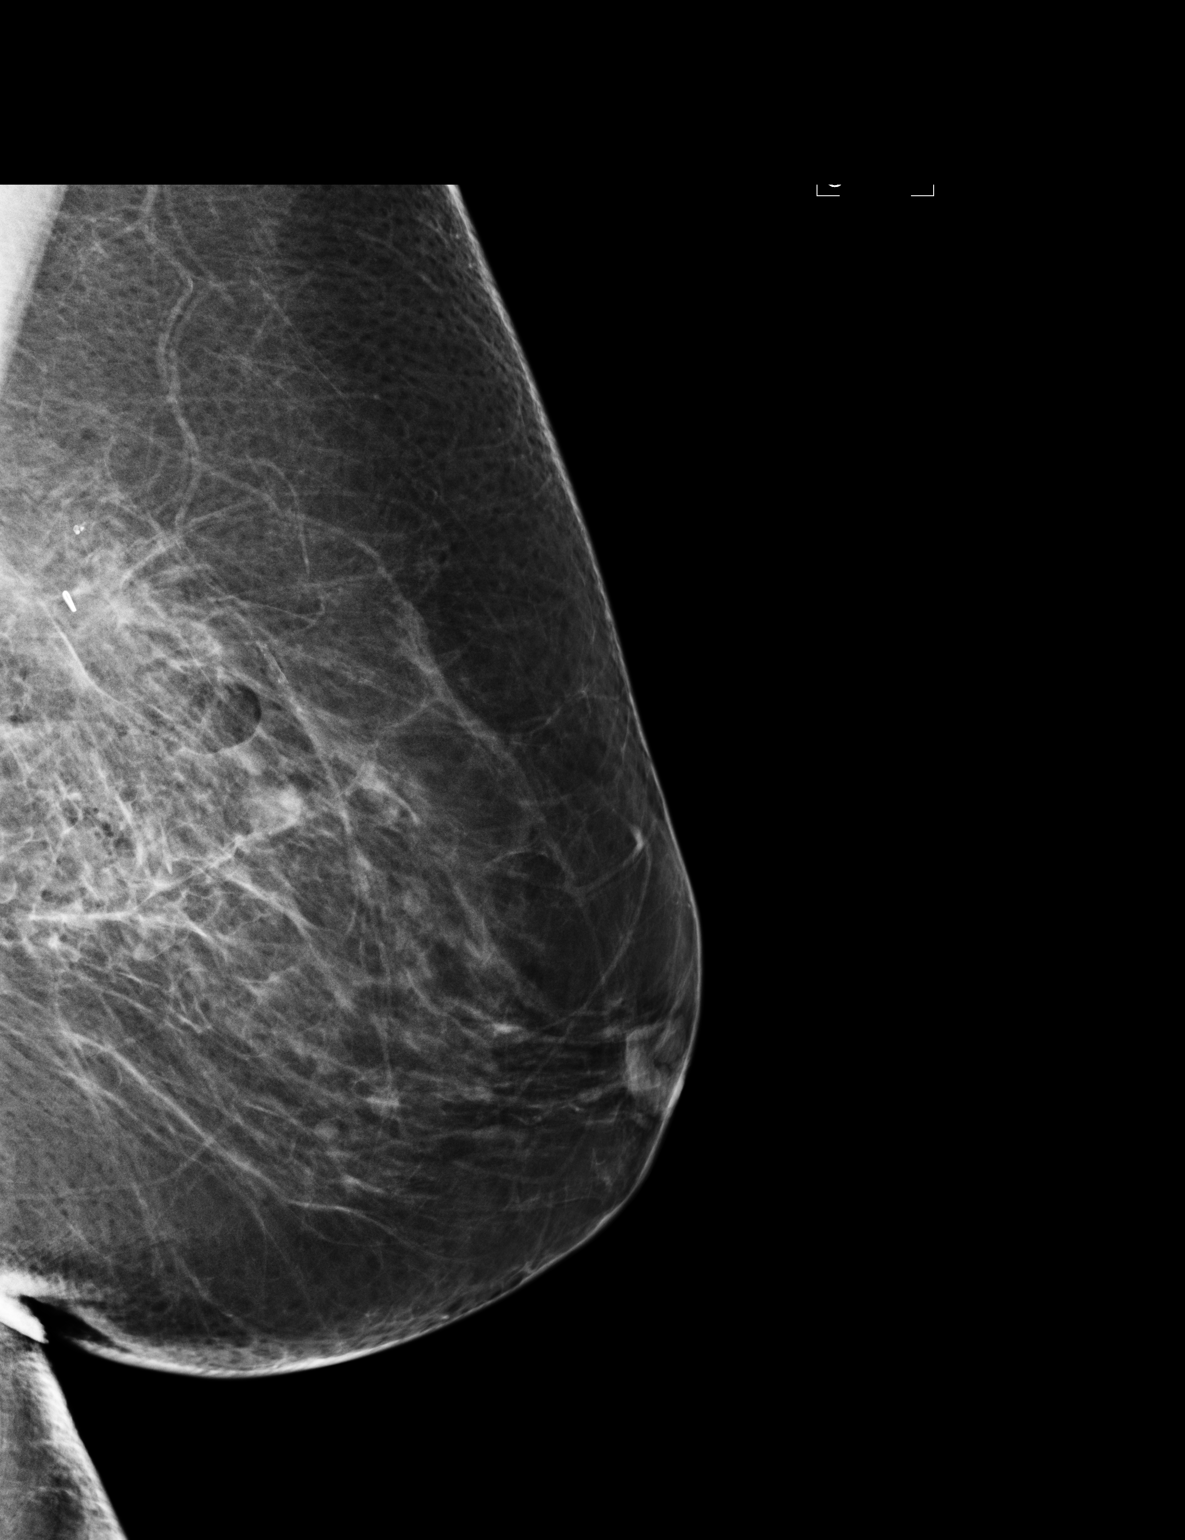

[1 of 1 positions shown; findings below may reference images not displayed]

FINDINGS: Films are performed following ultrasound guided biopsy
of the left breast.  Mammographic images show there is a ribbon
shaped clip in the upper inner quadrant of the left breast
associated with the mass and distortion.
IMPRESSION: Status post ultrasound-guided core biopsy of the left breast with
pathology pending.

## 2013-04-14 IMAGING — US US BREAST BILATERAL
2 series · 13 of 15 positions shown · non-contrast
Comparison: With priors

CLINICAL DATA: Palpable left breast mass.

DIGITAL DIAGNOSTIC BILATERAL MAMMOGRAM WITH CAD AND BILATERAL
BREAST ULTRASOUND:

[Series 1: us breast bilateral · 0.07mm/px · 3 of 4 slices shown (1 of 2)]
[im 1/4]
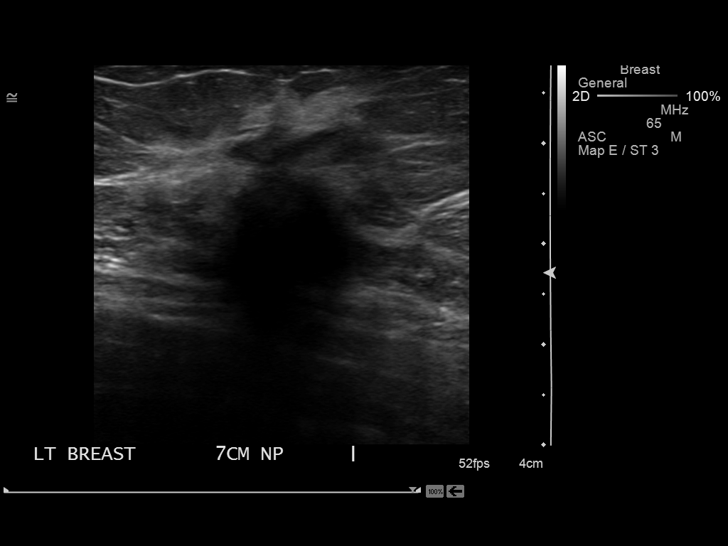
[im 2/4]
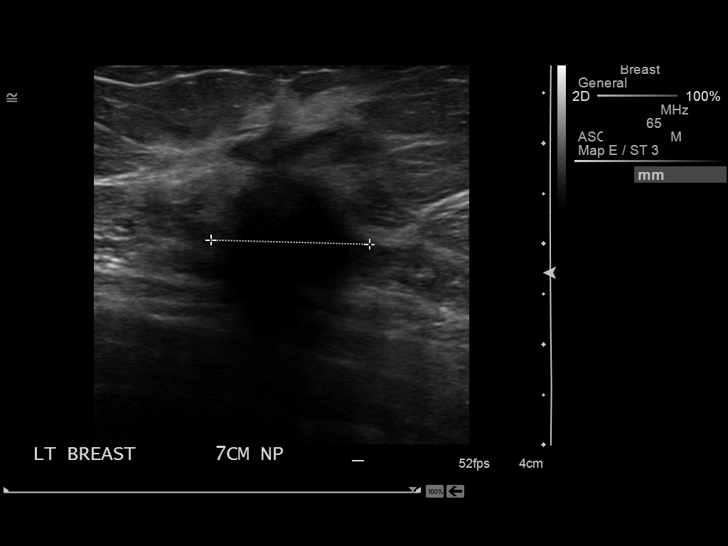
[im 3/4]
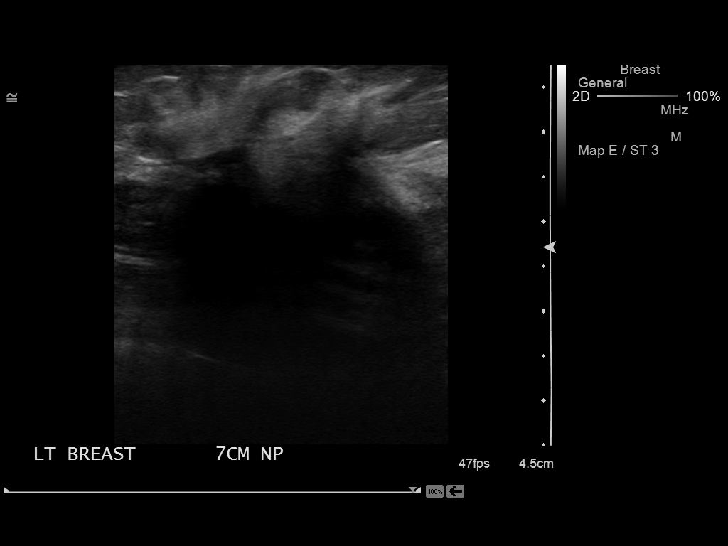

[Series 2: us breast bilateral · 0.07mm/px · 10 of 11 slices shown (2 of 2)]
[im 1/11]
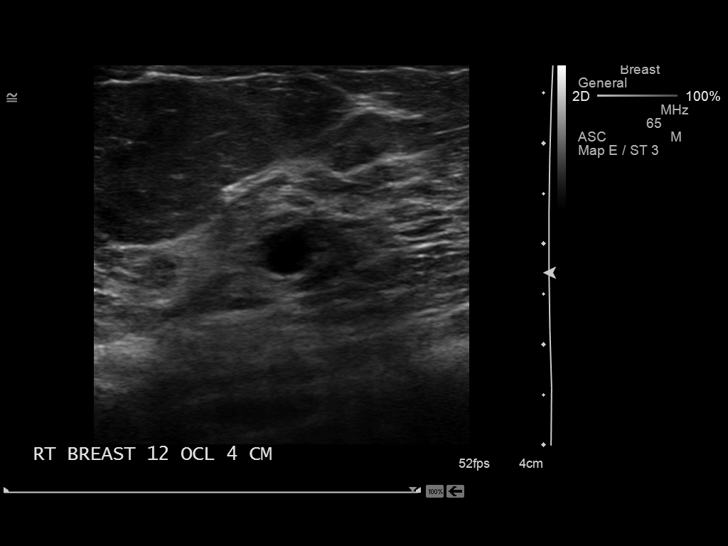
[im 2/11]
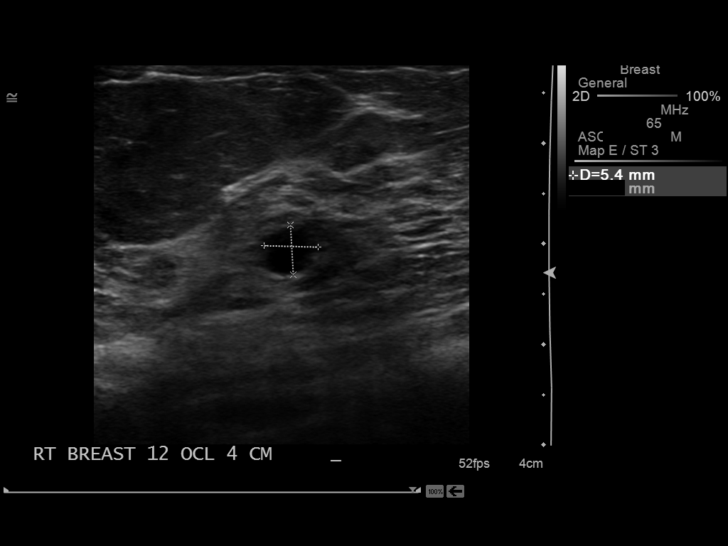
[im 3/11]
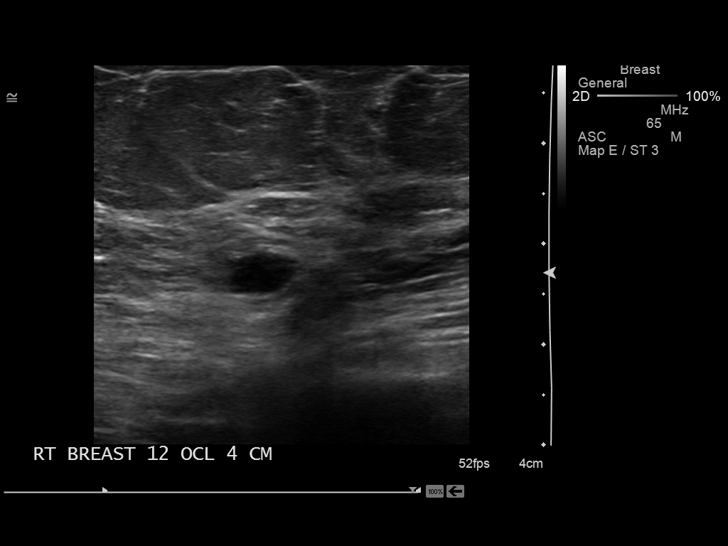
[im 4/11]
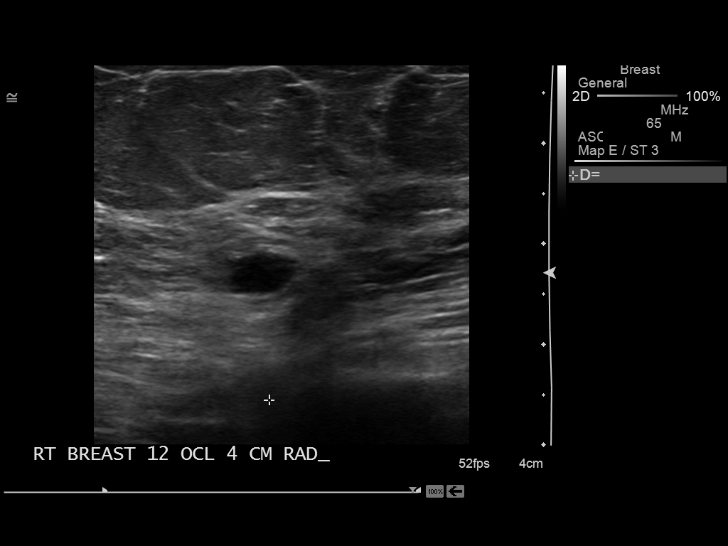
[im 5/11]
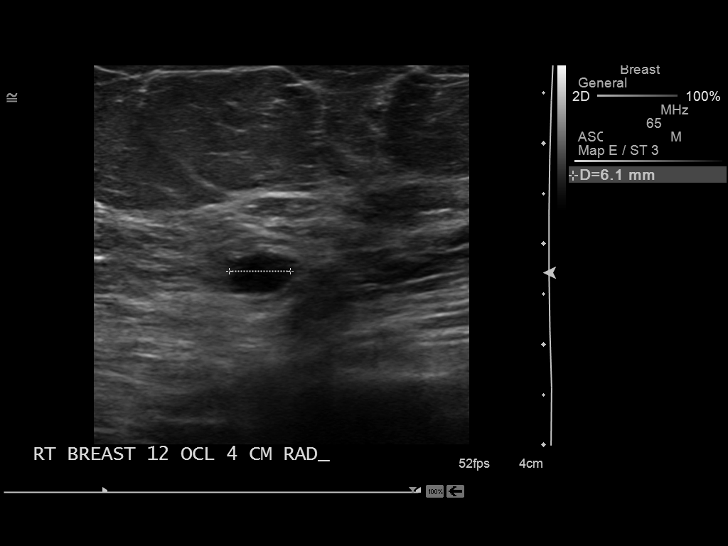
[im 6/11]
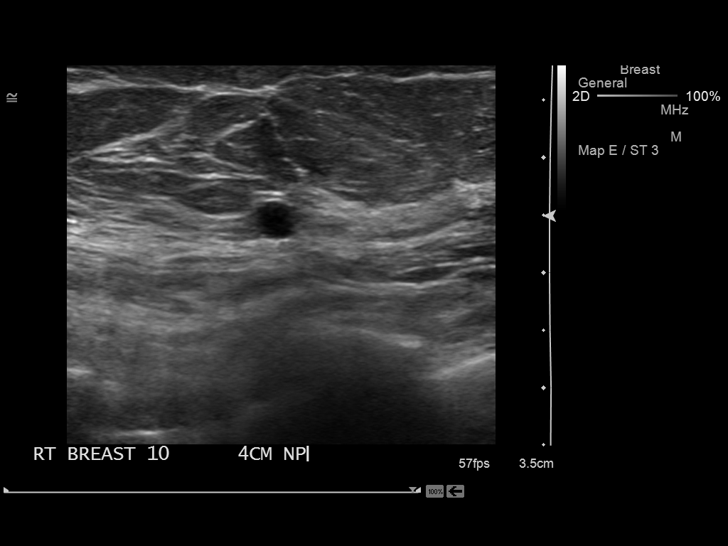
[im 7/11]
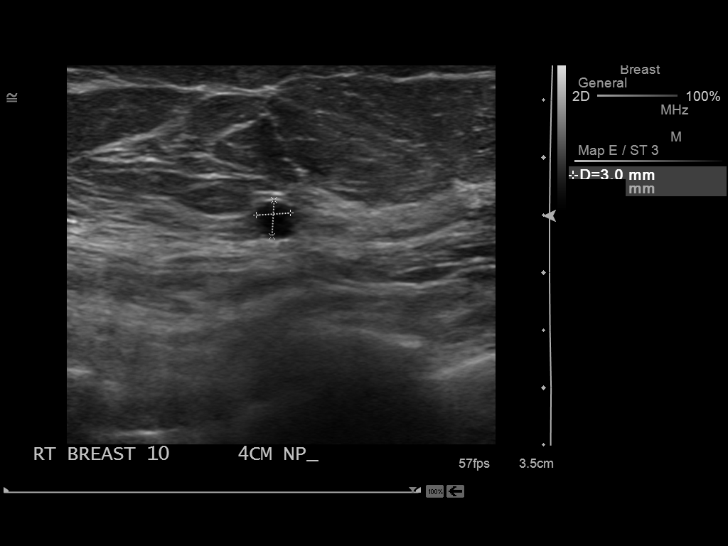
[im 9/11]
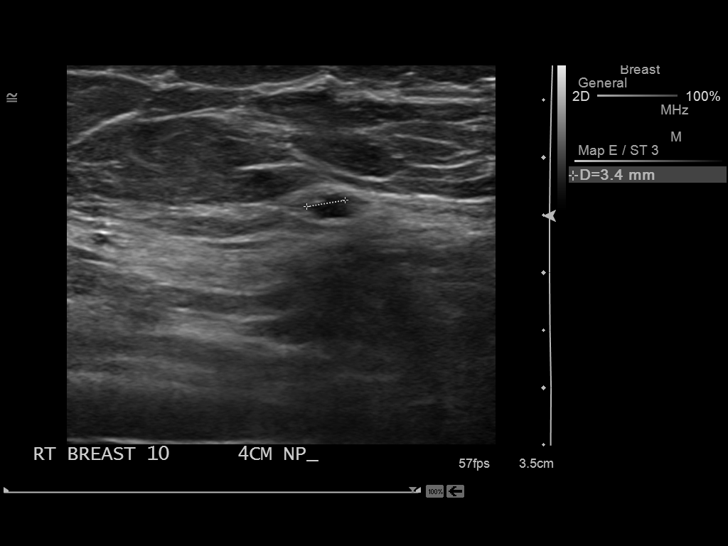
[im 10/11]
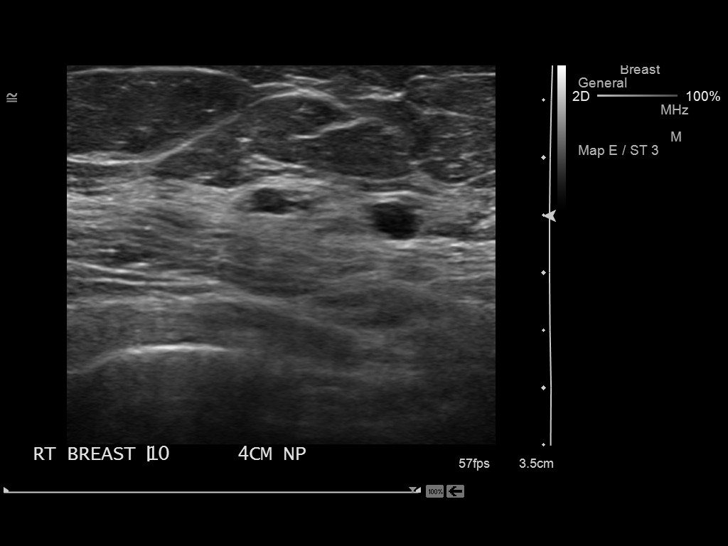
[im 11/11]
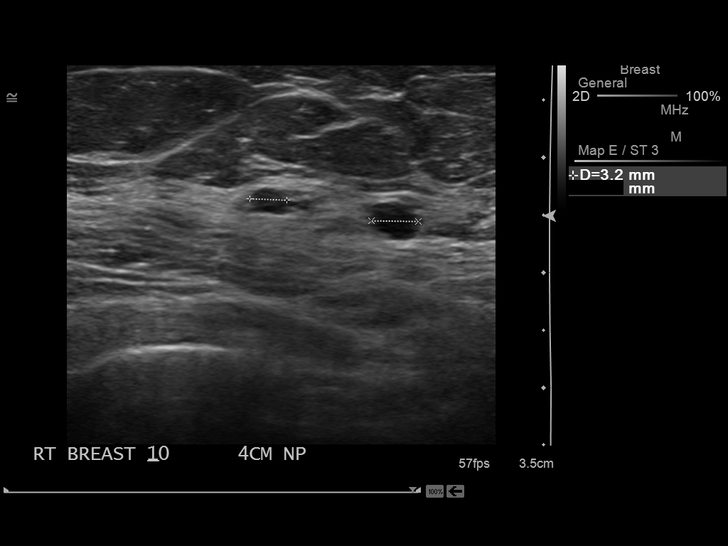

[13 of 15 positions shown; findings below may reference images not displayed]

FINDINGS: ACR Breast Density Category 2: There is a scattered fibroglandular
pattern.

There are fibronodular densities in the upper outer quadrant of the
right breast.  There is a developing mass and distortion in the
upper inner quadrant of the left breast.  There is a nodule in the
central portion of the left breast.

Mammographic images were processed with CAD.

On physical exam, I palpate a discrete mass in the left breast at
10 o'clock 7 cm from the nipple.

Ultrasound is performed, showing irregular, hypoechoic mass with
shadowing in the left breast at 10 o'clock 7 cm from the nipple
measuring 1.6 x 2.9 cm.  There is a simple cyst in the left breast
at 10 o'clock 3 cm from the nipple measuring 6 x 7 x 6 mm.
Sonographic evaluation of the left axilla fails to reveal any
enlarged adenopathy.

Sonographic evaluation of the right breast reveals multiple cysts.
There is a cyst at 10 o'clock 4 cm from the nipple measuring 3 mm.
There is an adjacent cyst at 10 o'clock 4 cm from the nipple
measuring 3 mm.  There is a cyst at 12 o'clock 4 cm from the nipple
measuring 5 mm.  No solid mass is identified.
IMPRESSION: Suspicious mass in the upper inner quadrant of the left breast.

RECOMMENDATION:
Ultrasound guided core biopsy of the left breast is recommended.
This will be performed and dictated separately.

I have discussed the findings and recommendations with the patient.
Results were also provided in writing at the conclusion of the
visit.  If applicable, a reminder letter will be sent to the
patient regarding the next appointment.

BI-RADS CATEGORY 5:  Highly suggestive of malignancy - appropriate
action should be taken.

## 2013-04-14 IMAGING — US US LT BREAST BX W LOC DEV 1ST LESION IMG BX SPEC US GUIDE
1 series · 12 of 12 positions shown · non-contrast
Comparison: Previous exams.

CLINICAL DATA: Suspicious left breast mass

ULTRASOUND GUIDED VACUUM ASSISTED CORE BIOPSY OF THE LEFT BREAST

[Series 1: us left breast bx w loc dev 1st lesion img bx spec · 0.07mm/px · 12 of 12 slices shown]
[im 1/12]
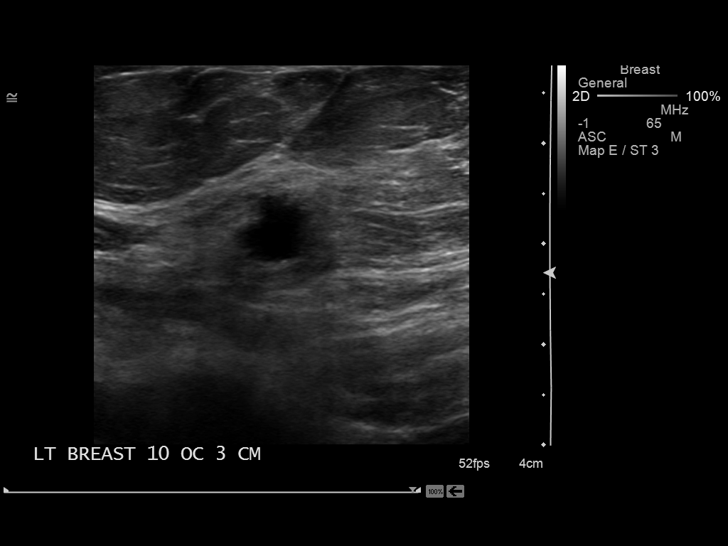
[im 2/12]
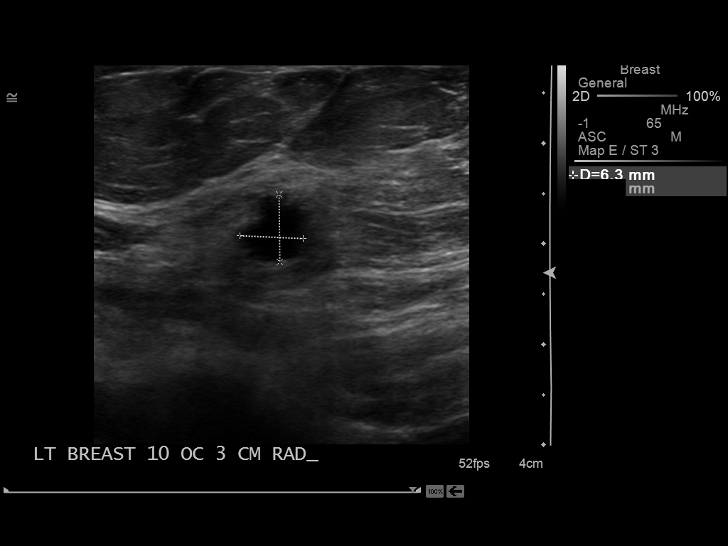
[im 3/12]
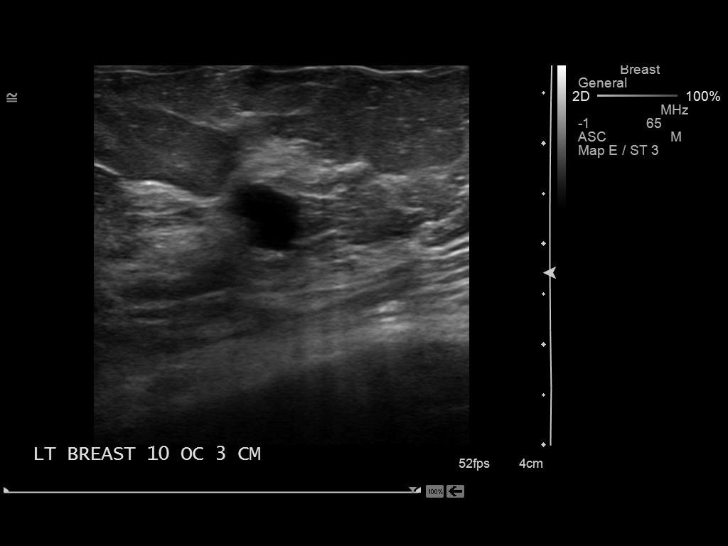
[im 4/12]
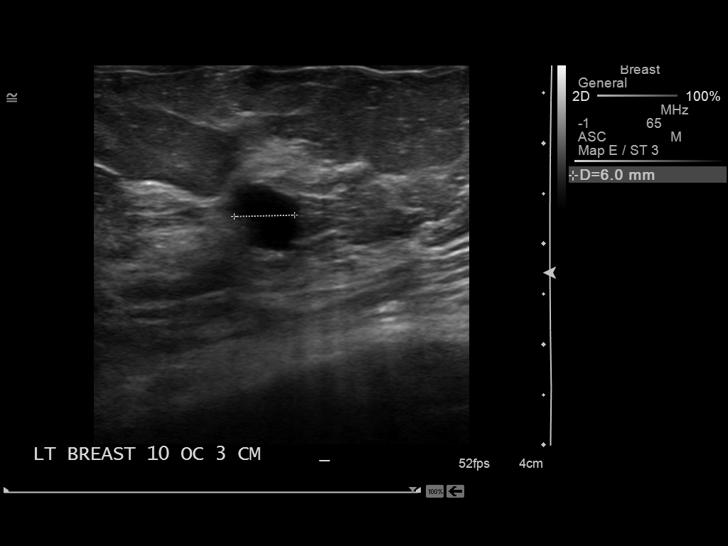
[im 5/12]
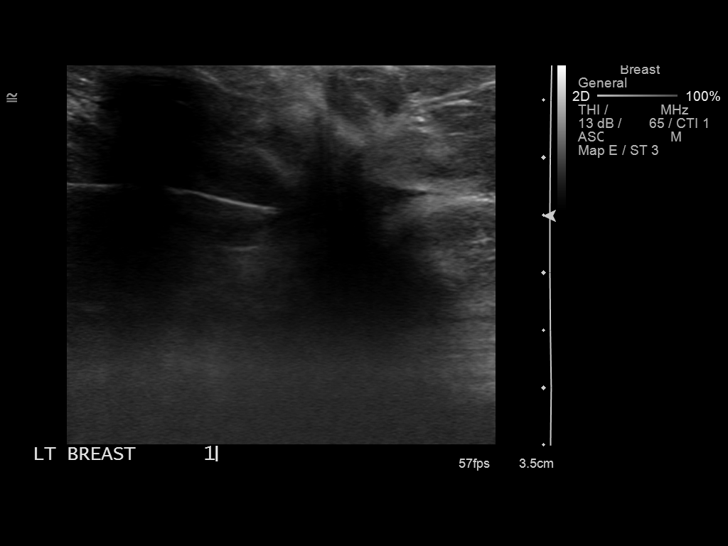
[im 6/12]
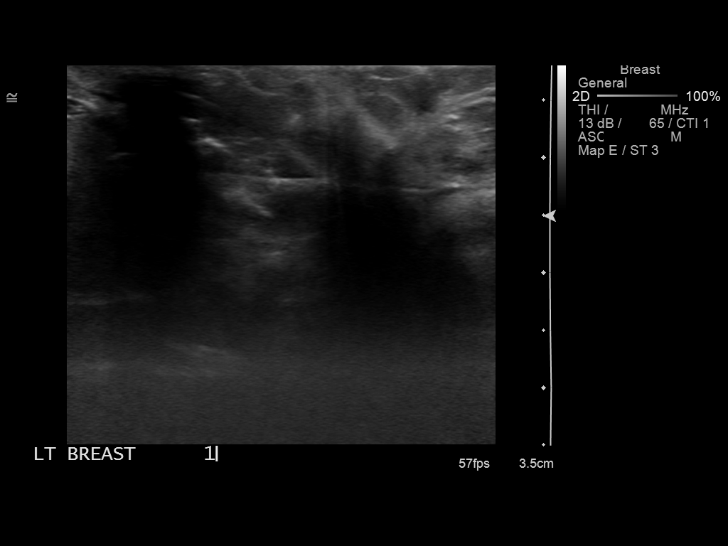
[im 7/12]
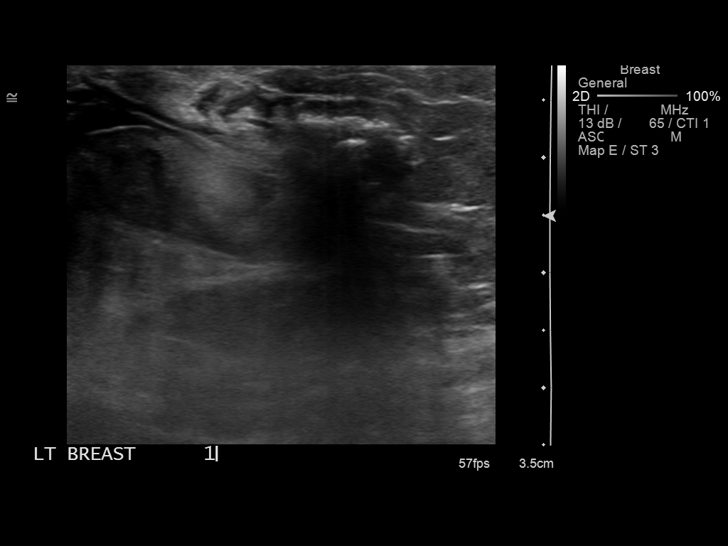
[im 8/12]
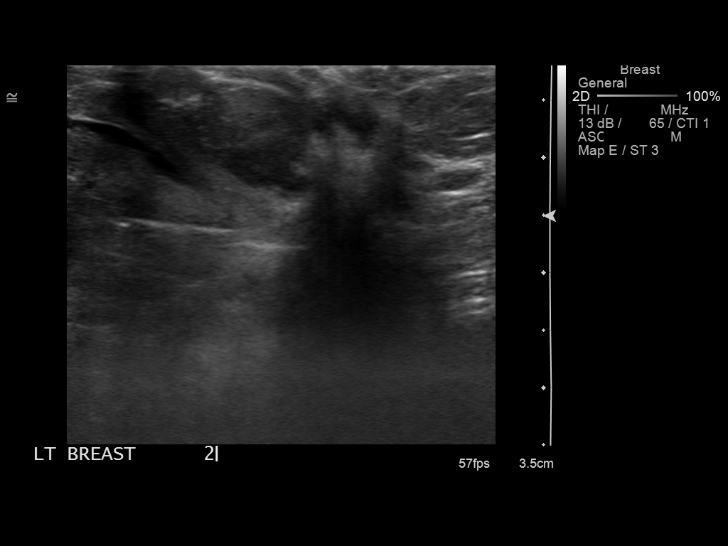
[im 9/12]
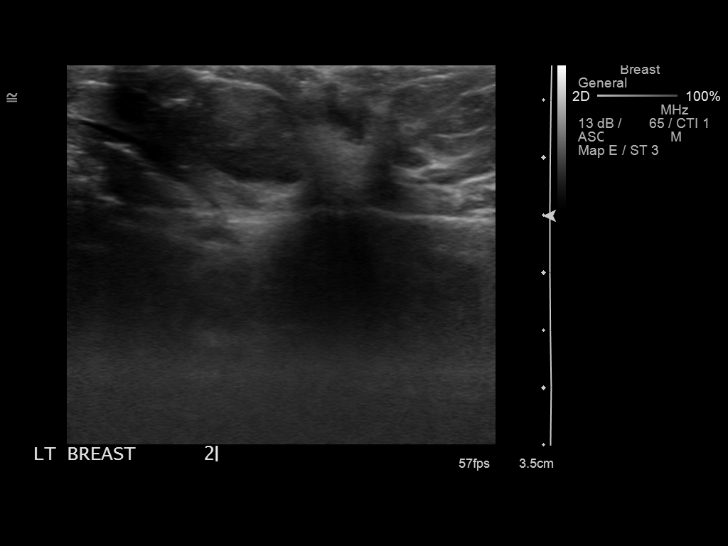
[im 10/12]
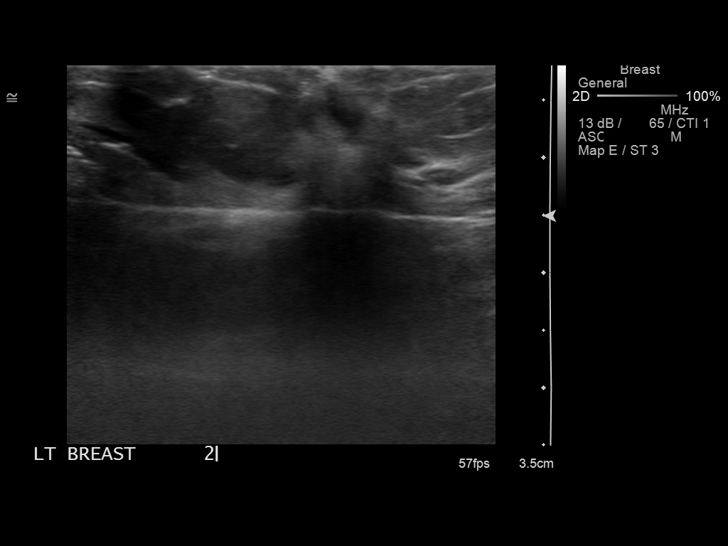
[im 11/12]
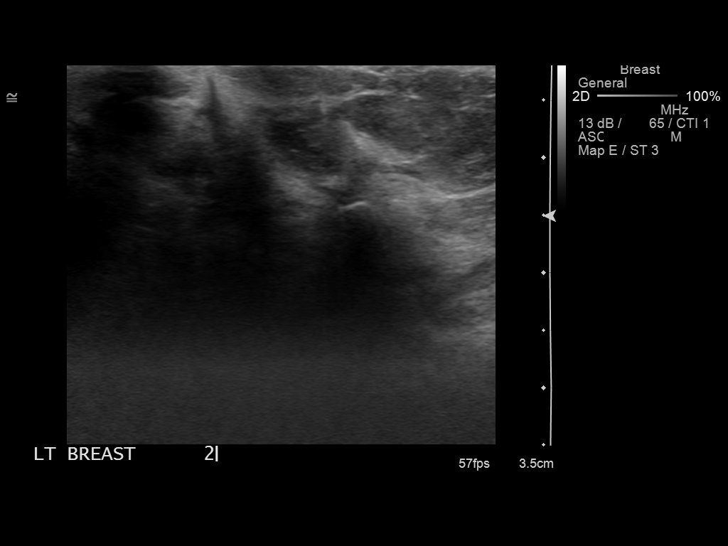
[im 12/12]
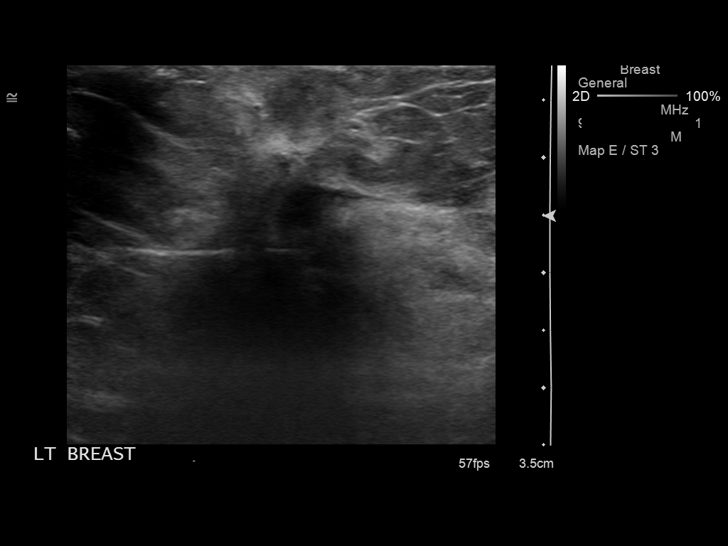

[12 of 12 positions shown; findings below may reference images not displayed]

I met with the patient and we discussed the procedure of ultrasound-
guided biopsy, including benefits and alternatives.  We discussed
the high likelihood of a successful procedure. We discussed the
risks of the procedure including infection, bleeding, tissue
injury, clip migration, and inadequate sampling.  Informed written
consent was given.

Using sterile technique, 2% lidocaine ultrasound guidance and a 12
gauge vacuum assisted needle biopsy was performed of the mass in
the 10 o'clock region of the left breast using an inferior
approach.  At the conclusion of the procedure, a ribbon shaped
tissue marker clip was deployed into the biopsy cavity.  Follow-up
2-view mammogram was performed and dictated separately.
IMPRESSION: Ultrasound-guided biopsy of the left breast.  No apparent
complications.

## 2013-04-14 IMAGING — MG MM DIGITAL DIAGNOSTIC BILAT CAD
4 series · 4 of 4 positions shown · non-contrast
Comparison: With priors

CLINICAL DATA: Palpable left breast mass.

DIGITAL DIAGNOSTIC BILATERAL MAMMOGRAM WITH CAD AND BILATERAL
BREAST ULTRASOUND:

[L MLO]
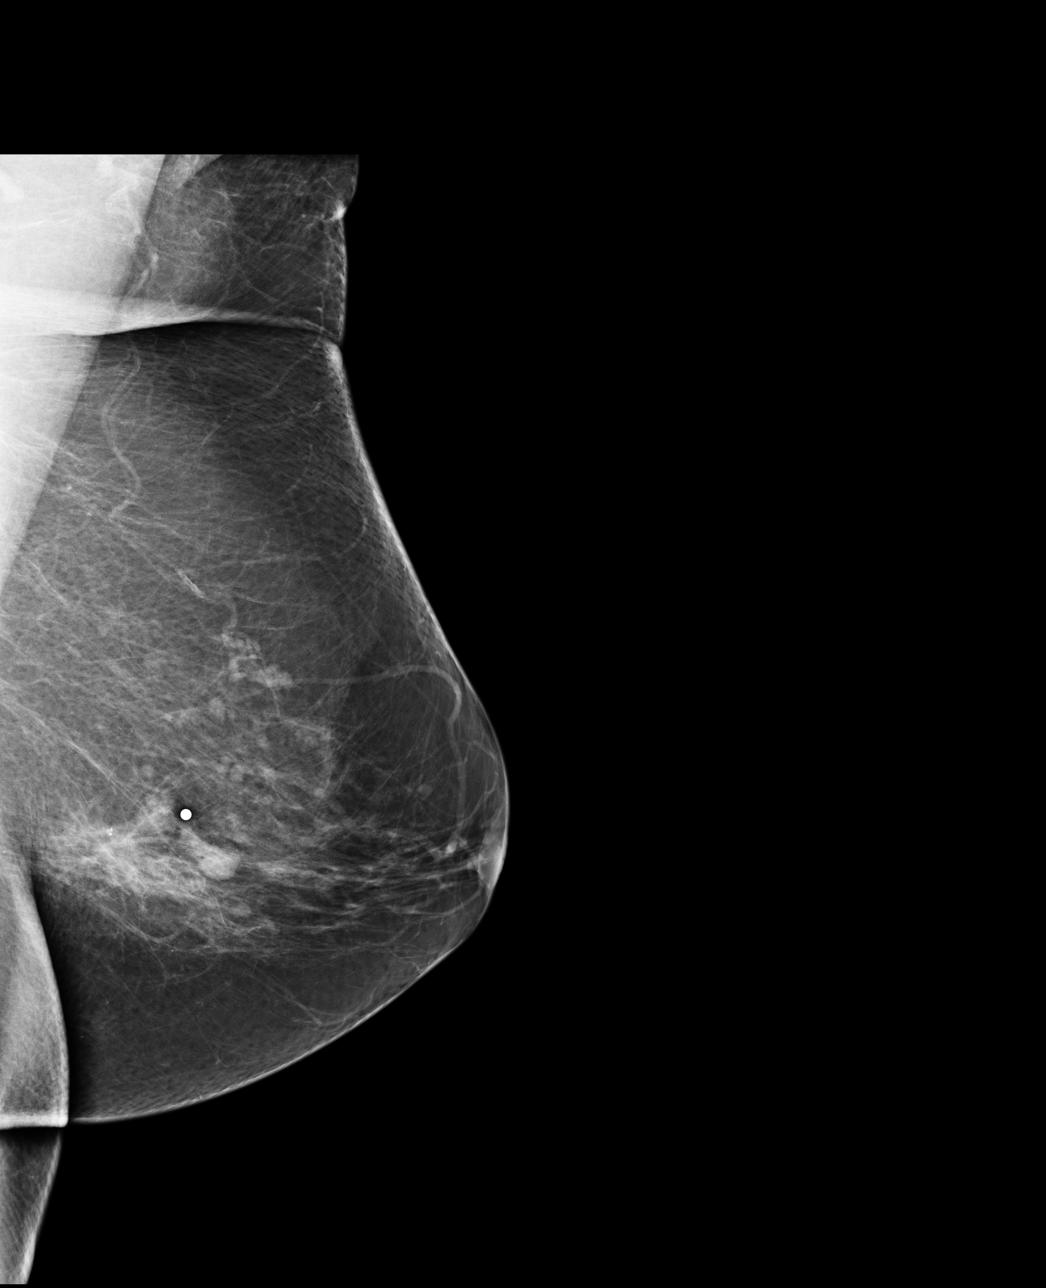

[R CC]
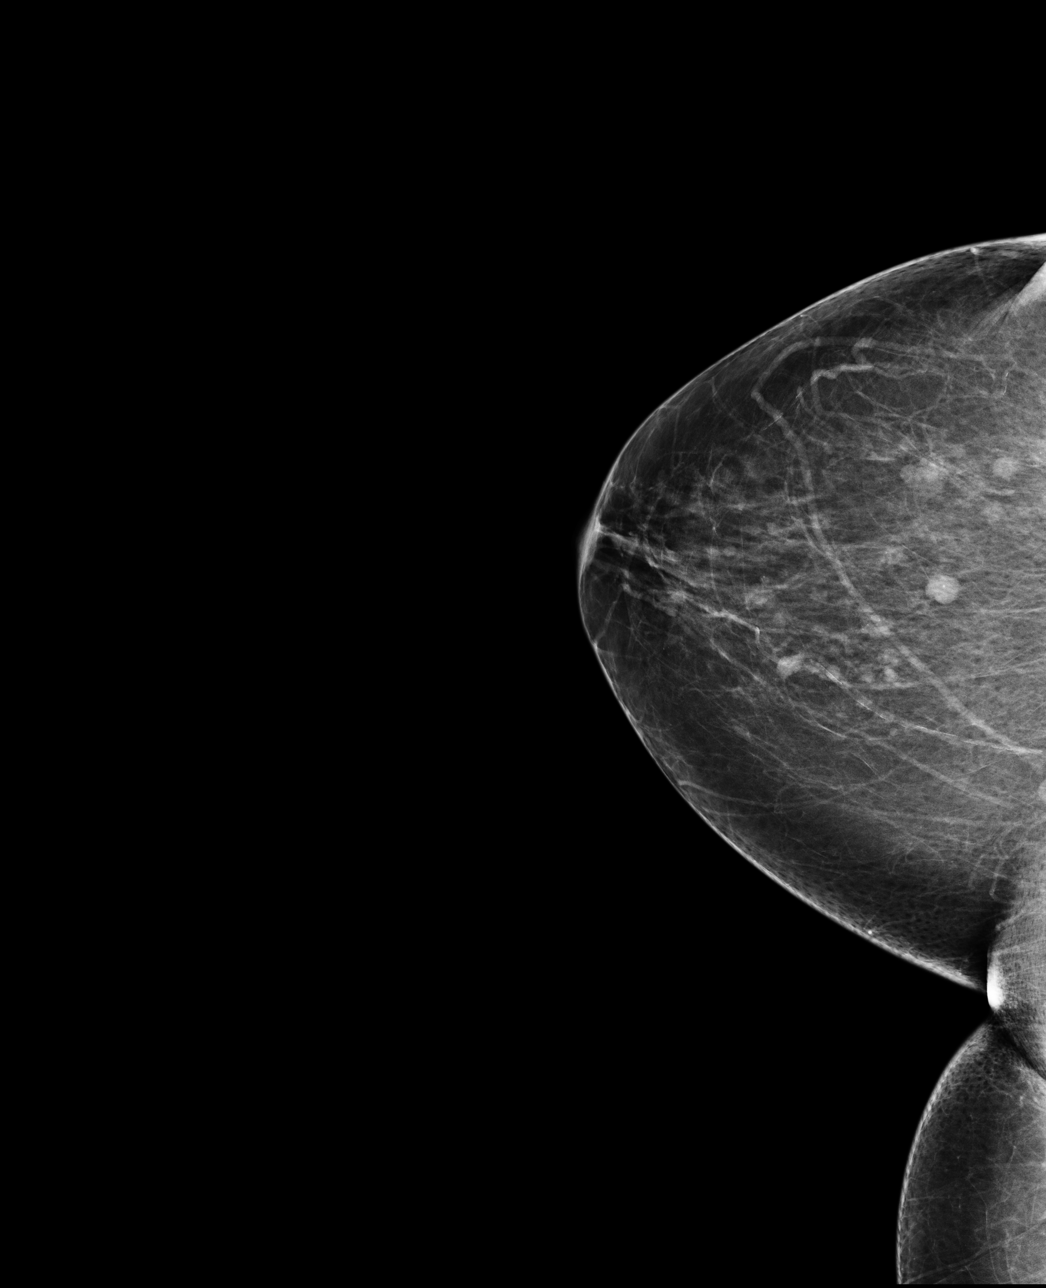

[R MLO]
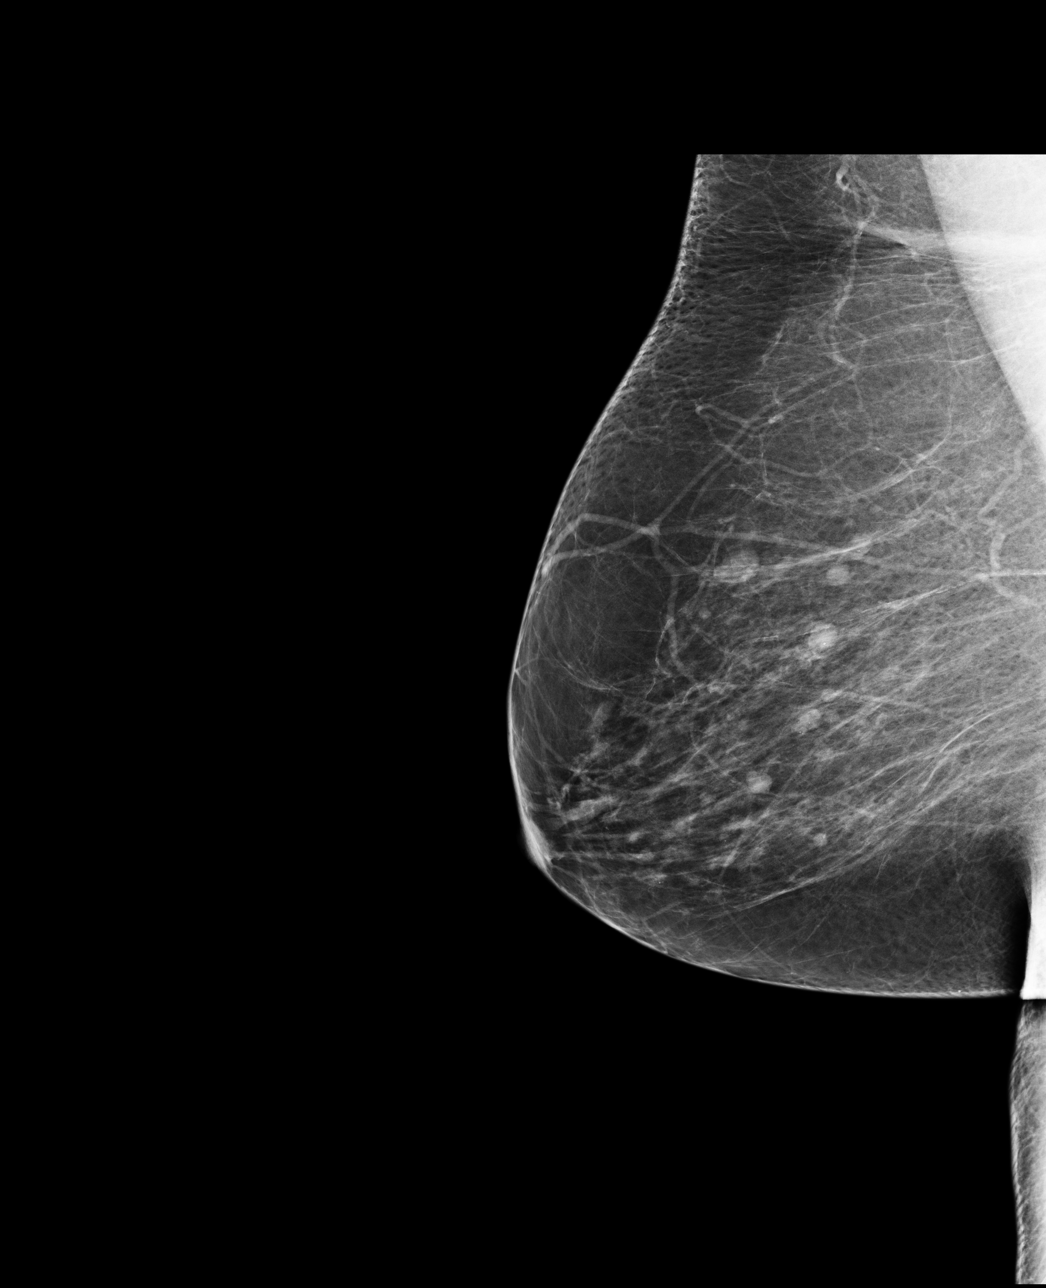

[L TAN]
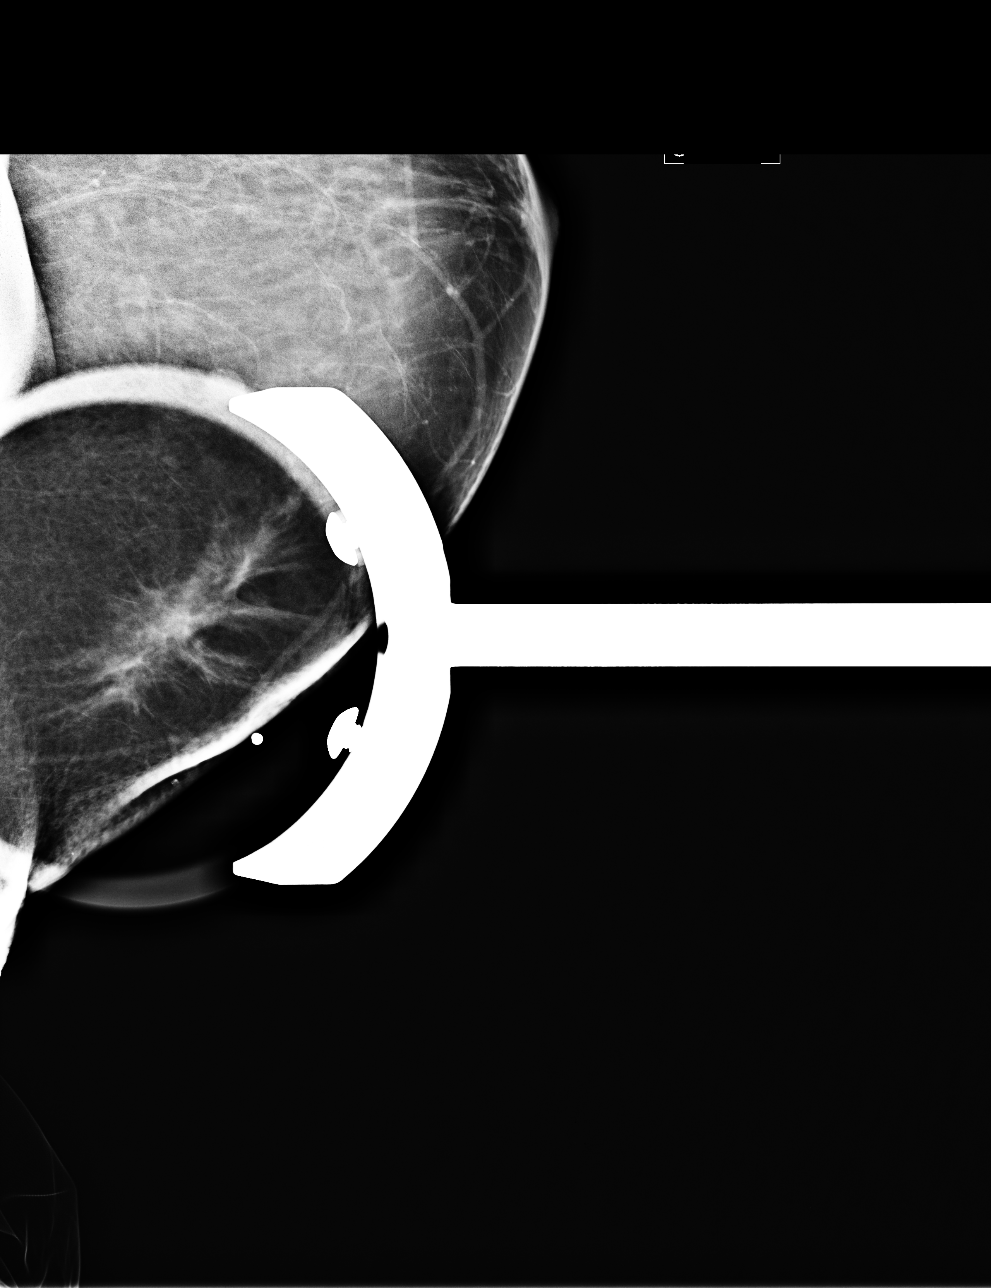

[4 of 4 positions shown; findings below may reference images not displayed]

FINDINGS: ACR Breast Density Category 2: There is a scattered fibroglandular
pattern.

There are fibronodular densities in the upper outer quadrant of the
right breast.  There is a developing mass and distortion in the
upper inner quadrant of the left breast.  There is a nodule in the
central portion of the left breast.

Mammographic images were processed with CAD.

On physical exam, I palpate a discrete mass in the left breast at
10 o'clock 7 cm from the nipple.

Ultrasound is performed, showing irregular, hypoechoic mass with
shadowing in the left breast at 10 o'clock 7 cm from the nipple
measuring 1.6 x 2.9 cm.  There is a simple cyst in the left breast
at 10 o'clock 3 cm from the nipple measuring 6 x 7 x 6 mm.
Sonographic evaluation of the left axilla fails to reveal any
enlarged adenopathy.

Sonographic evaluation of the right breast reveals multiple cysts.
There is a cyst at 10 o'clock 4 cm from the nipple measuring 3 mm.
There is an adjacent cyst at 10 o'clock 4 cm from the nipple
measuring 3 mm.  There is a cyst at 12 o'clock 4 cm from the nipple
measuring 5 mm.  No solid mass is identified.
IMPRESSION: Suspicious mass in the upper inner quadrant of the left breast.

RECOMMENDATION:
Ultrasound guided core biopsy of the left breast is recommended.
This will be performed and dictated separately.

I have discussed the findings and recommendations with the patient.
Results were also provided in writing at the conclusion of the
visit.  If applicable, a reminder letter will be sent to the
patient regarding the next appointment.

BI-RADS CATEGORY 5:  Highly suggestive of malignancy - appropriate
action should be taken.

## 2013-04-14 MED ORDER — LIDOCAINE HCL (PF) 2 % IJ SOLN
INTRAMUSCULAR | Status: AC
  Administered 2013-04-14: 4 mL via INTRADERMAL
  Filled 2013-04-14: qty 10

## 2013-04-14 MED ORDER — LIDOCAINE HCL (PF) 2 % IJ SOLN
INTRAMUSCULAR | Status: AC
  Administered 2013-04-14: 10 mL via INTRADERMAL
  Filled 2013-04-14: qty 10

## 2013-04-14 NOTE — Progress Notes (Signed)
Lidocaine 2%              14mL injected                         Left breast biopsies performed

## 2013-04-20 ENCOUNTER — Ambulatory Visit (INDEPENDENT_AMBULATORY_CARE_PROVIDER_SITE_OTHER): Payer: Medicare Other | Admitting: General Surgery

## 2013-04-20 ENCOUNTER — Encounter (INDEPENDENT_AMBULATORY_CARE_PROVIDER_SITE_OTHER): Payer: Self-pay | Admitting: General Surgery

## 2013-04-20 VITALS — BP 142/82 | HR 82 | Temp 97.3°F | Resp 16 | Ht 66.5 in | Wt 163.4 lb

## 2013-04-20 DIAGNOSIS — C50919 Malignant neoplasm of unspecified site of unspecified female breast: Secondary | ICD-10-CM | POA: Diagnosis not present

## 2013-04-20 DIAGNOSIS — Z853 Personal history of malignant neoplasm of breast: Secondary | ICD-10-CM | POA: Insufficient documentation

## 2013-04-20 DIAGNOSIS — C50912 Malignant neoplasm of unspecified site of left female breast: Secondary | ICD-10-CM

## 2013-04-20 NOTE — Progress Notes (Signed)
Subjective:   new diagnosis of left breast cancer  Patient ID: Kara Woods, female   DOB: 04-06-1933, 79 y.o.   MRN: [id number]  HPI Patient is a very pleasant 79 year old female referred by Dr. Judyann Munson for a new diagnosis of left breast cancer. The patient was in her usual state of health when about 2 weeks ago she incidentally noticed a mass in the upper inner left breast. She saw her primary physician and was referred to the breast center for further evaluation. I have reviewed all of her imaging. Mammogram reveals an area of distortion an increased density in the upper inner quadrant of the left breast. A small notch was also noted centrally in the left breast. Ultrasound showed a simple cyst of the left breast but on the right at the 10:00 position in the upper-inner quadrant 7 cm from the nipple with a 2.9 cm hypoechoic mass. Large core needle biopsy was recommended and accepted. This has revealed low-grade invasive mammary carcinoma. Prognostic panel is pending at the time of this evaluation. MRI has not been performed and the patient has metal in her left thigh that would contraindicate MRI. The patient generally has been feeling well. She denies any other breast masses, skin changes or nipple discharge or unusual pain. She has no personal history of breast disease nor any significant family history of breast cancer. She is accompanied by both of her daughters today.  Past Medical History  Diagnosis Date  . GERD (gastroesophageal reflux disease)   . Proctitis     colonsocopy 2007, Canasa suppositories  . S/P endoscopy March 2009    mild erosive reflux esophagitis, Schatzki's ring, s/p dilation  . Schatzki's ring   . Colitis   . Arthritis    Past Surgical History  Procedure Laterality Date  . Lumbar disc surgery  1993  . Cardiac catheterization  1998  . Retinal detachment surgery      Left  . Colonoscopy  05/06/2006    Diffuse inflammatory changes of the rectal mucosa, consistent   with proctitis.  Otherwise, normal colon to terminal ileum  . Esophagogastroduodenoscopy  02/09/2008    Schatzki ring status post dilation/Distal esophageal erosion consistent with mild erosive reflux  esophagitis, otherwise unremarkable esophagus, normal stomach, D1, D2.   Current Outpatient Prescriptions  Medication Sig Dispense Refill  . acetaminophen (TYLENOL) 325 MG tablet Take 650 mg by mouth every 6 (six) hours as needed.        . clorazepate (TRANXENE) 7.5 MG tablet Take 7.5 mg by mouth 2 (two) times daily as needed.        Marland Kitchen escitalopram (LEXAPRO) 10 MG tablet       . LOTEMAX 0.5 % ophthalmic suspension Place 1 drop into both eyes 4 (four) times daily.       . mesalamine (CANASA) 1000 MG suppository 1 per rectum twice weekly  30 suppository  3   No current facility-administered medications for this visit.   Allergies  Allergen Reactions  . Sulfonamide Derivatives    History  Substance Use Topics  . Smoking status: Former Smoker -- 1.00 packs/day    Types: Cigarettes  . Smokeless tobacco: Not on file     Comment: quit 19 yrs ago  . Alcohol Use: Yes     Comment: a glass of wine once or twice a week     Review of Systems  Constitutional: Negative for fever, chills, fatigue and unexpected weight change.  Eyes: Positive for visual disturbance.  Respiratory: Positive  for shortness of breath (Mild, exertional).   Cardiovascular: Negative.   Gastrointestinal: Negative.   Genitourinary: Negative.   Musculoskeletal: Positive for back pain.       Objective:   Physical Exam BP 142/82  Pulse 82  Temp(Src) 97.3 F (36.3 C) (Temporal)  Resp 16  Ht 5' 6.5" (1.689 m)  Wt 163 lb 6.4 oz (74.118 kg)  BMI 25.98 kg/m2   General: Alert, well-developed Caucasian female, in no distress Skin: Warm and dry without rash or infection. HEENT: No palpable masses or thyromegaly. Sclera nonicteric. Marland Kitchen Oropharynx clear. Lymph nodes: No cervical, supraclavicular, or inguinal nodes  palpable. Breasts: In the upper inner left breast is approximately 2 cm palpable mass with some ecchymosis and bruising post biopsy. No skin changes or nipple inversion. No other masses in either breast. No axillary adenopathy palpable. Lungs: Breath sounds clear and equal without increased work of breathing Cardiovascular: Regular rate and rhythm without murmur. No JVD or edema. Peripheral pulses intact. Abdomen: Nondistended. Soft and nontender. No masses palpable. No organomegaly. No palpable hernias. Extremities: No edema or joint swelling or deformity. No chronic venous stasis changes. Neurologic: Alert and fully oriented. Gait normal.    Assessment:     New diagnosis of left breast cancer. Low-grade, 2.9 cm by ultrasound, in the upper inner quadrant, clinical T2 N0M0.  Prognostic panel pending.  I had a long discussion with the patient and her daughters today regarding initial surgical treatment options.  I believe she would be a candidate for breast conservation although her tumor is a large relative to the breast tissue in the upper watertight and there could be some deformity. We discussed options of lumpectomy versus mastectomy including the need for radiation with lumpectomy and likely not with mastectomy as well as possible need for further surgery based on final pathology findings and some slight increased risk of local recurrence versus mastectomy with no differences survival. We discussed in general terms radiation treatment and side effects. We discussed sentinel lymph node biopsy and its indications. After thorough discussion of all their questions were answered she would prefer lumpectomy with sentinel lymph node biopsy. We discussed the nature of the surgery and recovery as well as risks of bleeding, infection, slight risk of lymphedema and possible need for further surgery based on final pathology findings. She understands she would need radiation therapy.    Plan:     Needle  localized left breast lumpectomy with axillary sentinel lymph node biopsy as an outpatient under general anesthesia.

## 2013-04-21 ENCOUNTER — Encounter (HOSPITAL_COMMUNITY): Payer: Medicare Other

## 2013-04-28 ENCOUNTER — Encounter (HOSPITAL_BASED_OUTPATIENT_CLINIC_OR_DEPARTMENT_OTHER): Payer: Self-pay | Admitting: *Deleted

## 2013-04-28 NOTE — Progress Notes (Signed)
Pt very anxious-when had a card cath 98 when she was upset=then saw dr Tresa Endo with sob-was anxiety 3 yr ago No labs needed Takes lasix as needed-has not had in a month

## 2013-05-03 ENCOUNTER — Other Ambulatory Visit (INDEPENDENT_AMBULATORY_CARE_PROVIDER_SITE_OTHER): Payer: Self-pay | Admitting: General Surgery

## 2013-05-03 ENCOUNTER — Ambulatory Visit
Admission: RE | Admit: 2013-05-03 | Discharge: 2013-05-03 | Disposition: A | Discharge status: Home / Self Care | Payer: Medicare Other | Source: Ambulatory Visit | Attending: General Surgery | Admitting: General Surgery

## 2013-05-03 ENCOUNTER — Encounter (HOSPITAL_BASED_OUTPATIENT_CLINIC_OR_DEPARTMENT_OTHER): Payer: Self-pay

## 2013-05-03 ENCOUNTER — Encounter (HOSPITAL_BASED_OUTPATIENT_CLINIC_OR_DEPARTMENT_OTHER): Payer: Self-pay | Admitting: Anesthesiology

## 2013-05-03 ENCOUNTER — Ambulatory Visit (HOSPITAL_BASED_OUTPATIENT_CLINIC_OR_DEPARTMENT_OTHER)
Admission: RE | Admit: 2013-05-03 | Discharge: 2013-05-03 | Disposition: A | Discharge status: Home / Self Care | Payer: Medicare Other | Source: Ambulatory Visit | Attending: General Surgery | Admitting: General Surgery

## 2013-05-03 ENCOUNTER — Ambulatory Visit (HOSPITAL_COMMUNITY)
Admission: RE | Admit: 2013-05-03 | Discharge: 2013-05-03 | Disposition: A | Discharge status: Home / Self Care | Payer: Medicare Other | Source: Ambulatory Visit | Attending: General Surgery | Admitting: General Surgery

## 2013-05-03 ENCOUNTER — Ambulatory Visit (HOSPITAL_BASED_OUTPATIENT_CLINIC_OR_DEPARTMENT_OTHER): Payer: Medicare Other | Admitting: Anesthesiology

## 2013-05-03 ENCOUNTER — Encounter (HOSPITAL_BASED_OUTPATIENT_CLINIC_OR_DEPARTMENT_OTHER)
Admission: RE | Disposition: A | Discharge status: Home / Self Care | Payer: Self-pay | Source: Ambulatory Visit | Attending: General Surgery

## 2013-05-03 DIAGNOSIS — Z87891 Personal history of nicotine dependence: Secondary | ICD-10-CM | POA: Diagnosis not present

## 2013-05-03 DIAGNOSIS — K219 Gastro-esophageal reflux disease without esophagitis: Secondary | ICD-10-CM | POA: Diagnosis not present

## 2013-05-03 DIAGNOSIS — C50912 Malignant neoplasm of unspecified site of left female breast: Secondary | ICD-10-CM

## 2013-05-03 DIAGNOSIS — K5289 Other specified noninfective gastroenteritis and colitis: Secondary | ICD-10-CM | POA: Diagnosis not present

## 2013-05-03 DIAGNOSIS — Z882 Allergy status to sulfonamides status: Secondary | ICD-10-CM | POA: Insufficient documentation

## 2013-05-03 DIAGNOSIS — I2510 Atherosclerotic heart disease of native coronary artery without angina pectoris: Secondary | ICD-10-CM | POA: Insufficient documentation

## 2013-05-03 DIAGNOSIS — C50919 Malignant neoplasm of unspecified site of unspecified female breast: Secondary | ICD-10-CM | POA: Insufficient documentation

## 2013-05-03 DIAGNOSIS — C50219 Malignant neoplasm of upper-inner quadrant of unspecified female breast: Secondary | ICD-10-CM | POA: Diagnosis not present

## 2013-05-03 DIAGNOSIS — Z79899 Other long term (current) drug therapy: Secondary | ICD-10-CM | POA: Diagnosis not present

## 2013-05-03 DIAGNOSIS — M129 Arthropathy, unspecified: Secondary | ICD-10-CM | POA: Diagnosis not present

## 2013-05-03 DIAGNOSIS — Z170 Estrogen receptor positive status [ER+]: Secondary | ICD-10-CM | POA: Diagnosis not present

## 2013-05-03 HISTORY — DX: Depression, unspecified: F32.A

## 2013-05-03 HISTORY — DX: Presence of spectacles and contact lenses: Z97.3

## 2013-05-03 HISTORY — DX: Major depressive disorder, single episode, unspecified: F32.9

## 2013-05-03 HISTORY — DX: Unspecified hearing loss, unspecified ear: H91.90

## 2013-05-03 HISTORY — DX: Anxiety disorder, unspecified: F41.9

## 2013-05-03 HISTORY — PX: BREAST LUMPECTOMY WITH NEEDLE LOCALIZATION AND AXILLARY SENTINEL LYMPH NODE BX: SHX5760

## 2013-05-03 HISTORY — DX: Presence of dental prosthetic device (complete) (partial): Z97.2

## 2013-05-03 HISTORY — DX: Shortness of breath: R06.02

## 2013-05-03 HISTORY — DX: Blindness, one eye, unspecified eye: H54.40

## 2013-05-03 IMAGING — MG MM PLC LOC DEV 1ST LESION MAMMO GUIDE
2 series · 2 of 2 positions shown · non-contrast
Comparison: none

CLINICAL DATA: Left breast cancer

[L ML (1 of 2)]
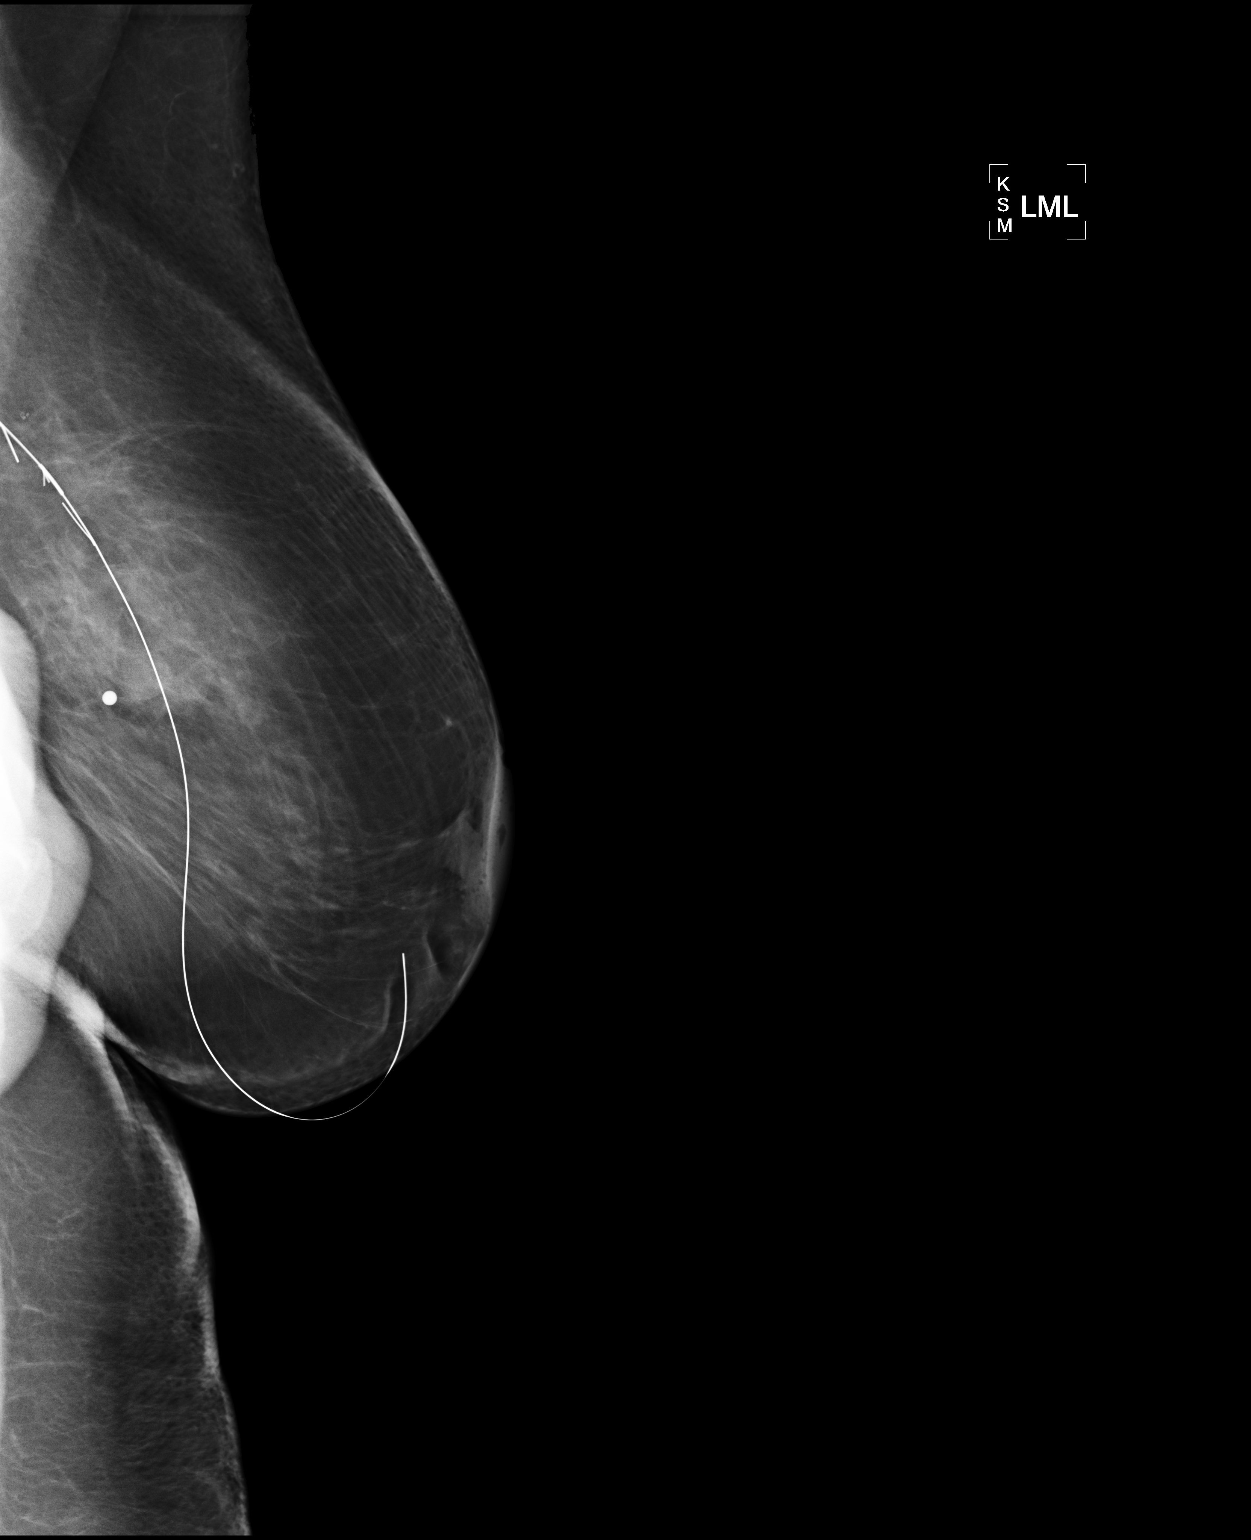

[L ML (2 of 2)]
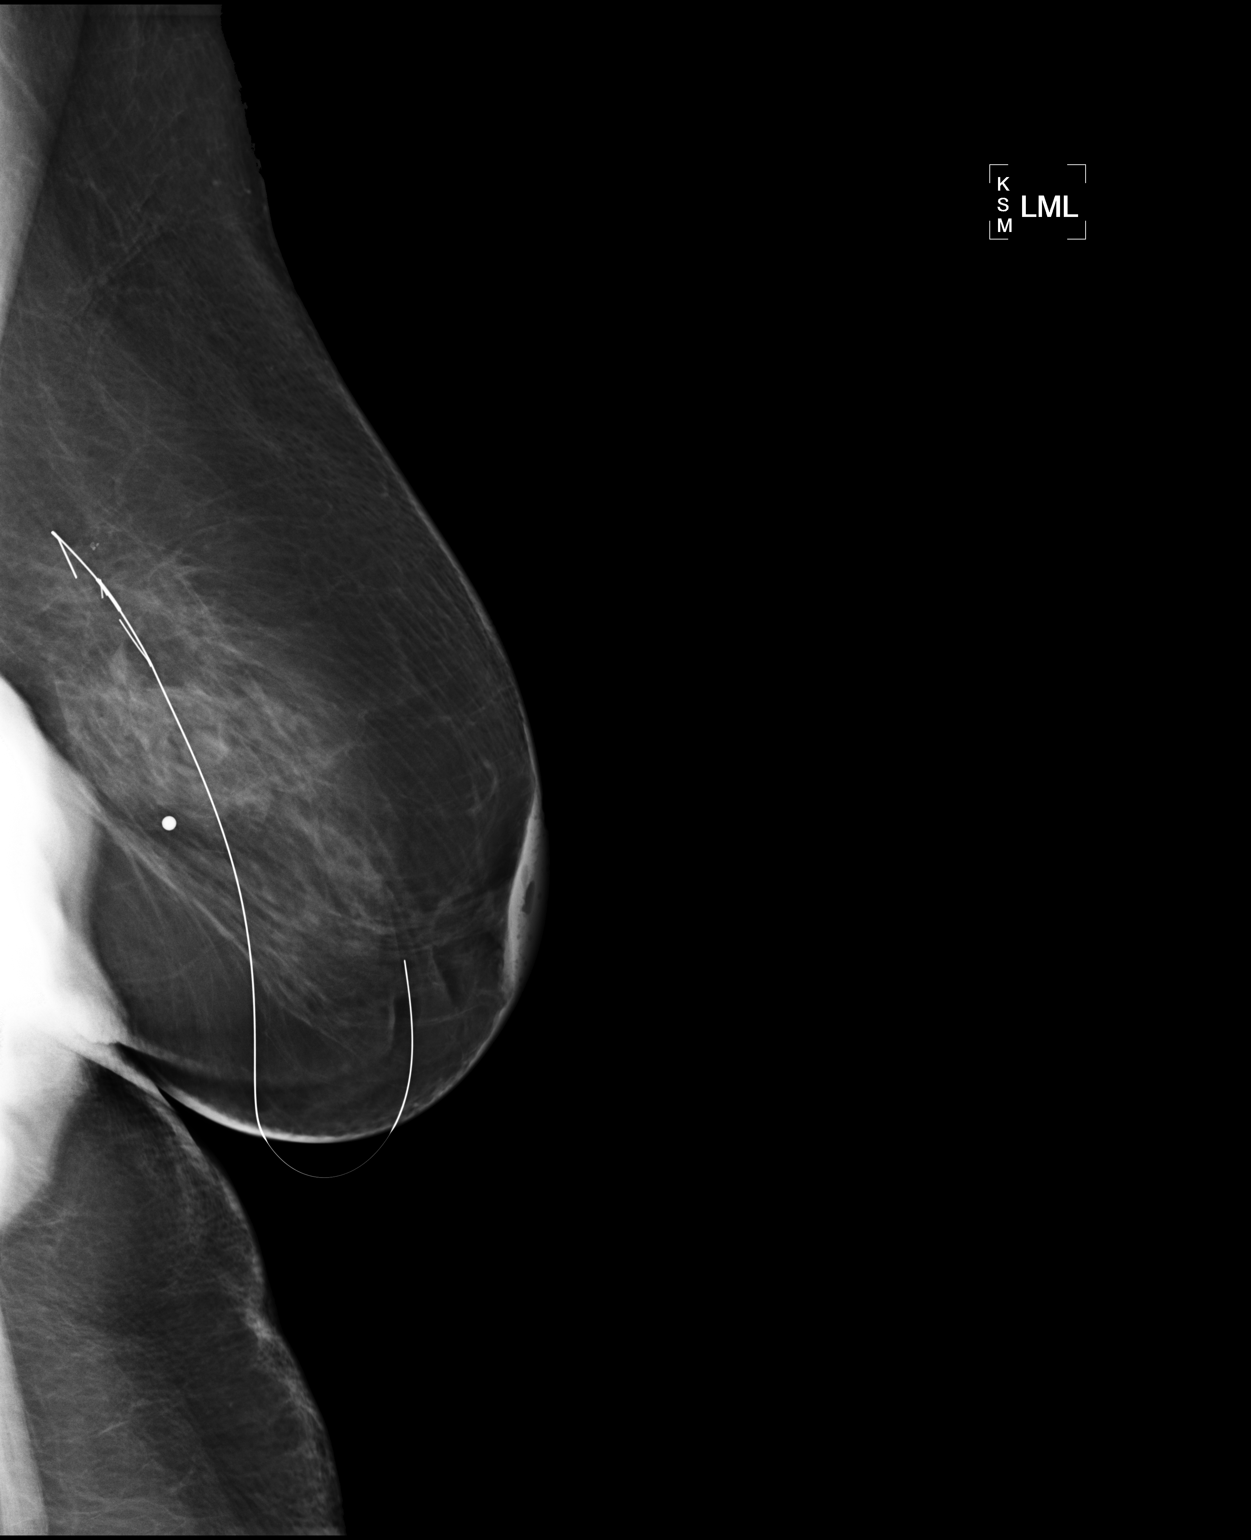

[2 of 2 positions shown; findings below may reference images not displayed]

LEFT BREAST NEEDLE LOCALIZATION USING ULTRASOUND GUIDANCE AND
SPECIMEN RADIOGRAPH

Patient presents for needle localization prior to surgical
excision.  The patient and I discussed the procedure of needle
localization including benefits and alternatives. We discussed the
high likelihood of a successful procedure. We discussed the risks
of the procedure, including infection, bleeding, tissue injury, and
further surgery. Written informed consent was given.  Appropriate
time-out was performed.

Using ultrasound guidance, sterile technique, 2% lidocaine, and a 7
cm InRad Ultrawire needle, the mass in the left upper inner
quadrant was localized using a caudocranial approach.  Films were
labeled and sent with the patient to surgery.  She tolerated the
procedure well.

Specimen radiograph is performed at [HOSPITAL] Day [HOSPITAL]
and confirms the mass, clip, and wire to be present in the tissue
sample.  The specimen is marked for pathology.  Results were
discussed with the nurse in the operating room.
IMPRESSION: Needle localization left breast.  No apparent complications.

## 2013-05-03 IMAGING — US US WIRE LOC LT
1 series · 3 of 3 positions shown · non-contrast
Comparison: none

CLINICAL DATA: Left breast cancer

[Series 1: us wire loc left · 3 of 3 slices shown]
[im 1/3]
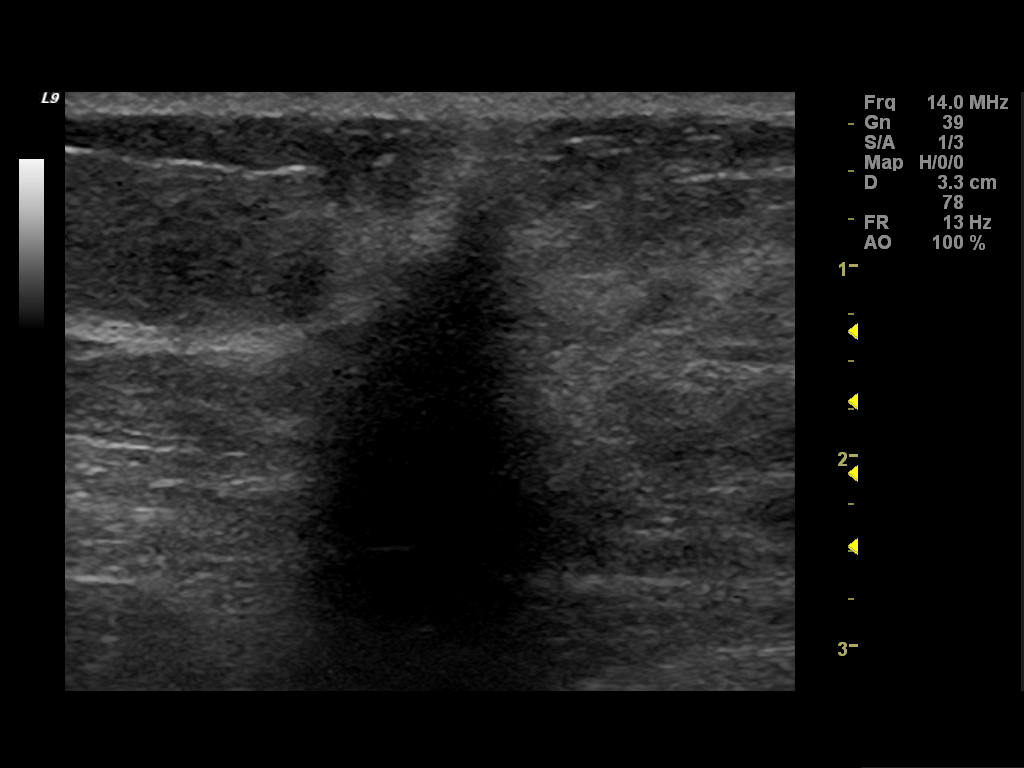
[im 2/3]
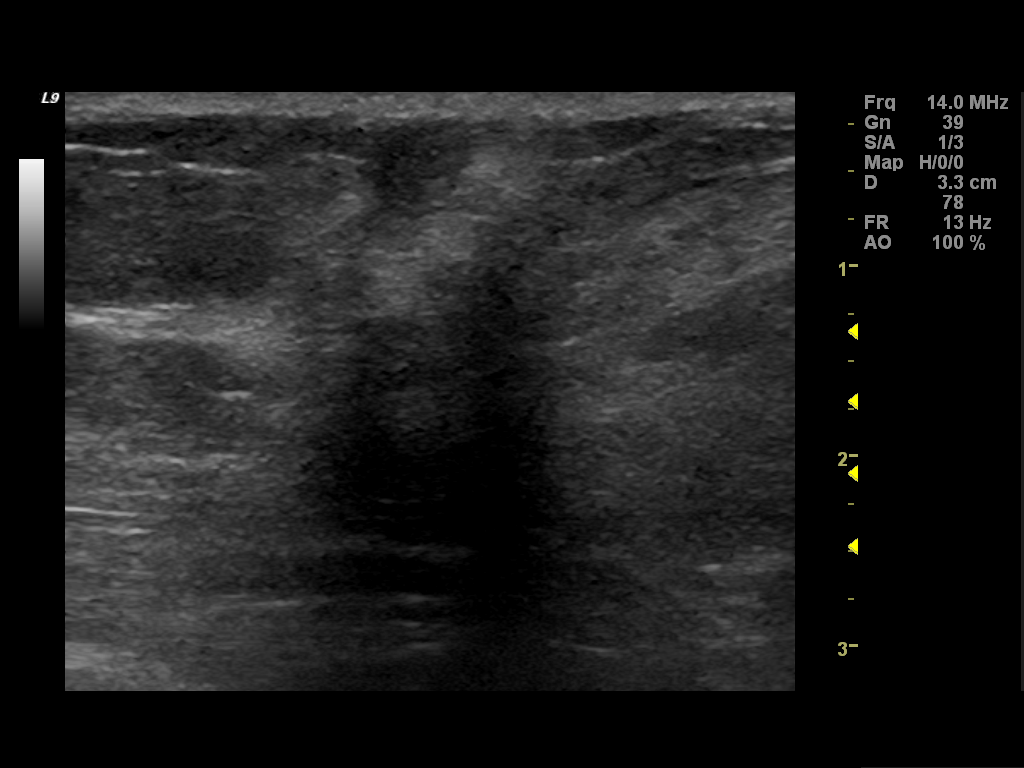
[im 3/3]
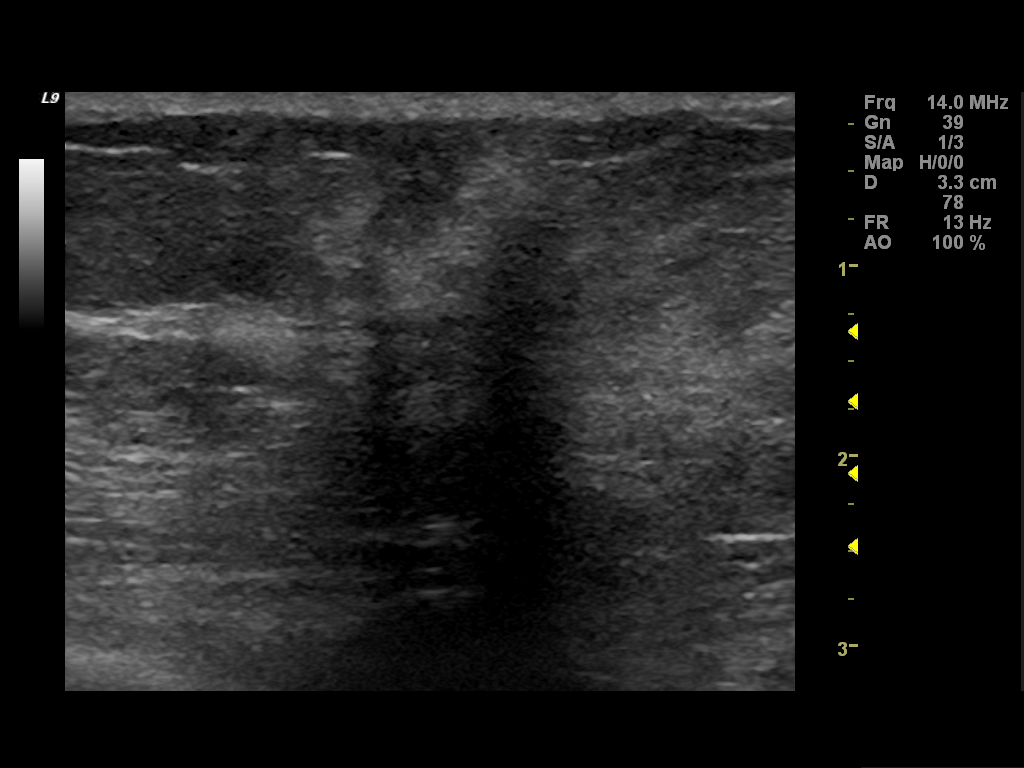

[3 of 3 positions shown; findings below may reference images not displayed]

LEFT BREAST NEEDLE LOCALIZATION USING ULTRASOUND GUIDANCE AND
SPECIMEN RADIOGRAPH

Patient presents for needle localization prior to surgical
excision.  The patient and I discussed the procedure of needle
localization including benefits and alternatives. We discussed the
high likelihood of a successful procedure. We discussed the risks
of the procedure, including infection, bleeding, tissue injury, and
further surgery. Written informed consent was given.  Appropriate
time-out was performed.

Using ultrasound guidance, sterile technique, 2% lidocaine, and a 7
cm InRad Ultrawire needle, the mass in the left upper inner
quadrant was localized using a caudocranial approach.  Films were
labeled and sent with the patient to surgery.  She tolerated the
procedure well.

Specimen radiograph is performed at [HOSPITAL] Day [HOSPITAL]
and confirms the mass, clip, and wire to be present in the tissue
sample.  The specimen is marked for pathology.  Results were
discussed with the nurse in the operating room.
IMPRESSION: Needle localization left breast.  No apparent complications.

## 2013-05-03 SURGERY — BREAST LUMPECTOMY WITH NEEDLE LOCALIZATION AND AXILLARY SENTINEL LYMPH NODE BX
Anesthesia: General | Site: Breast | Laterality: Left | Wound class: Clean

## 2013-05-03 MED ORDER — CHLORHEXIDINE GLUCONATE 4 % EX LIQD: 1.0000 "application " | Freq: Once | CUTANEOUS | Status: DC

## 2013-05-03 MED ORDER — CEFAZOLIN SODIUM-DEXTROSE 2-3 GM-% IV SOLR
2.0000 g | INTRAVENOUS | Status: AC
  Administered 2013-05-03: 2 g via INTRAVENOUS

## 2013-05-03 MED ORDER — PROPOFOL 10 MG/ML IV BOLUS
INTRAVENOUS | Status: DC | PRN
  Administered 2013-05-03: 150 mg via INTRAVENOUS

## 2013-05-03 MED ORDER — BUPIVACAINE-EPINEPHRINE PF 0.5-1:200000 % IJ SOLN
INTRAMUSCULAR | Status: DC | PRN
  Administered 2013-05-03: 20 mL

## 2013-05-03 MED ORDER — ONDANSETRON HCL 4 MG/2ML IJ SOLN: 4.0000 mg | Freq: Once | INTRAMUSCULAR | Status: DC | PRN

## 2013-05-03 MED ORDER — OXYCODONE HCL 5 MG/5ML PO SOLN: 5.0000 mg | Freq: Once | ORAL | Status: AC | PRN

## 2013-05-03 MED ORDER — HYDROCODONE-ACETAMINOPHEN 5-325 MG PO TABS: 1.0000 | ORAL_TABLET | ORAL | Status: DC | PRN

## 2013-05-03 MED ORDER — FENTANYL CITRATE 0.05 MG/ML IJ SOLN
50.0000 ug | INTRAMUSCULAR | Status: DC | PRN
  Administered 2013-05-03: 100 ug via INTRAVENOUS

## 2013-05-03 MED ORDER — MIDAZOLAM HCL 2 MG/2ML IJ SOLN
1.0000 mg | INTRAMUSCULAR | Status: DC | PRN
  Administered 2013-05-03: 2 mg via INTRAVENOUS

## 2013-05-03 MED ORDER — LACTATED RINGERS IV SOLN
INTRAVENOUS | Status: DC
  Administered 2013-05-03: 20 mL/h via INTRAVENOUS
  Administered 2013-05-03 (×2): via INTRAVENOUS

## 2013-05-03 MED ORDER — TECHNETIUM TC 99M SULFUR COLLOID FILTERED
1.0000 | Freq: Once | INTRAVENOUS | Status: AC | PRN
  Administered 2013-05-03: 1 via INTRADERMAL

## 2013-05-03 MED ORDER — SUFENTANIL CITRATE 50 MCG/ML IV SOLN
INTRAVENOUS | Status: DC | PRN
  Administered 2013-05-03: 5 ug via INTRAVENOUS

## 2013-05-03 MED ORDER — LIDOCAINE HCL (CARDIAC) 20 MG/ML IV SOLN
INTRAVENOUS | Status: DC | PRN
  Administered 2013-05-03: 50 mg via INTRAVENOUS

## 2013-05-03 MED ORDER — OXYCODONE HCL 5 MG PO TABS
5.0000 mg | ORAL_TABLET | Freq: Once | ORAL | Status: AC | PRN
  Administered 2013-05-03: 5 mg via ORAL

## 2013-05-03 MED ORDER — HYDROMORPHONE HCL PF 1 MG/ML IJ SOLN
0.2500 mg | INTRAMUSCULAR | Status: DC | PRN
  Administered 2013-05-03 (×3): 0.5 mg via INTRAVENOUS

## 2013-05-03 MED ORDER — ONDANSETRON HCL 4 MG/2ML IJ SOLN
INTRAMUSCULAR | Status: DC | PRN
  Administered 2013-05-03: 4 mg via INTRAVENOUS

## 2013-05-03 MED ORDER — SODIUM CHLORIDE 0.9 % IJ SOLN
INTRAMUSCULAR | Status: DC | PRN
  Administered 2013-05-03: 13:00:00

## 2013-05-03 SURGICAL SUPPLY — 58 items
ADH SKN CLS APL DERMABOND .7 (GAUZE/BANDAGES/DRESSINGS) ×1
APPLIER CLIP 11 MED OPEN (CLIP)
APR CLP MED 11 20 MLT OPN (CLIP)
BINDER BREAST LRG (GAUZE/BANDAGES/DRESSINGS) ×2 IMPLANT
BLADE SURG 10 STRL SS (BLADE) ×2 IMPLANT
BLADE SURG 15 STRL LF DISP TIS (BLADE) ×1 IMPLANT
BLADE SURG 15 STRL SS (BLADE) ×2
CANISTER SUCTION 1200CC (MISCELLANEOUS) ×2 IMPLANT
CHLORAPREP W/TINT 26ML (MISCELLANEOUS) ×2 IMPLANT
CLIP APPLIE 11 MED OPEN (CLIP) IMPLANT
CLIP TI WIDE RED SMALL 6 (CLIP) ×2 IMPLANT
CLOTH BEACON ORANGE TIMEOUT ST (SAFETY) ×2 IMPLANT
COVER MAYO STAND STRL (DRAPES) ×2 IMPLANT
COVER PROBE W GEL 5X96 (DRAPES) ×2 IMPLANT
COVER TABLE BACK 60X90 (DRAPES) ×2 IMPLANT
DECANTER SPIKE VIAL GLASS SM (MISCELLANEOUS) IMPLANT
DERMABOND ADVANCED (GAUZE/BANDAGES/DRESSINGS) ×1
DERMABOND ADVANCED .7 DNX12 (GAUZE/BANDAGES/DRESSINGS) ×1 IMPLANT
DEVICE DUBIN W/COMP PLATE 8390 (MISCELLANEOUS) ×2 IMPLANT
DRAIN CHANNEL 19F RND (DRAIN) IMPLANT
DRAIN HEMOVAC 1/8 X 5 (WOUND CARE) IMPLANT
DRAPE LAPAROSCOPIC ABDOMINAL (DRAPES) ×2 IMPLANT
DRAPE UTILITY XL STRL (DRAPES) ×2 IMPLANT
ELECT COATED BLADE 2.86 ST (ELECTRODE) ×2 IMPLANT
ELECT REM PT RETURN 9FT ADLT (ELECTROSURGICAL) ×2
ELECTRODE REM PT RTRN 9FT ADLT (ELECTROSURGICAL) ×1 IMPLANT
EVACUATOR SILICONE 100CC (DRAIN) IMPLANT
GLOVE BIO SURGEON STRL SZ7 (GLOVE) ×2 IMPLANT
GLOVE BIOGEL PI IND STRL 8 (GLOVE) ×1 IMPLANT
GLOVE BIOGEL PI INDICATOR 8 (GLOVE) ×1
GLOVE SS BIOGEL STRL SZ 7.5 (GLOVE) ×1 IMPLANT
GLOVE SUPERSENSE BIOGEL SZ 7.5 (GLOVE) ×1
GOWN PREVENTION PLUS XLARGE (GOWN DISPOSABLE) IMPLANT
GOWN PREVENTION PLUS XXLARGE (GOWN DISPOSABLE) ×4 IMPLANT
KIT MARKER MARGIN INK (KITS) ×2 IMPLANT
NDL SAFETY ECLIPSE 18X1.5 (NEEDLE) ×1 IMPLANT
NEEDLE HYPO 18GX1.5 SHARP (NEEDLE) ×2
NEEDLE HYPO 25X1 1.5 SAFETY (NEEDLE) ×4 IMPLANT
NS IRRIG 1000ML POUR BTL (IV SOLUTION) ×2 IMPLANT
PACK BASIN DAY SURGERY FS (CUSTOM PROCEDURE TRAY) ×2 IMPLANT
PAD ALCOHOL SWAB (MISCELLANEOUS) ×2 IMPLANT
PENCIL BUTTON HOLSTER BLD 10FT (ELECTRODE) ×2 IMPLANT
PIN SAFETY STERILE (MISCELLANEOUS) IMPLANT
SLEEVE SCD COMPRESS KNEE MED (MISCELLANEOUS) ×2 IMPLANT
SPONGE LAP 18X18 X RAY DECT (DISPOSABLE) IMPLANT
SPONGE LAP 4X18 X RAY DECT (DISPOSABLE) ×4 IMPLANT
SUT ETHILON 3 0 FSL (SUTURE) IMPLANT
SUT MON AB 4-0 PC3 18 (SUTURE) IMPLANT
SUT MON AB 5-0 PS2 18 (SUTURE) ×2 IMPLANT
SUT SILK 3 0 SH 30 (SUTURE) IMPLANT
SUT VIC AB 3-0 SH 27 (SUTURE)
SUT VIC AB 3-0 SH 27X BRD (SUTURE) IMPLANT
SUT VICRYL 3-0 CR8 SH (SUTURE) ×2 IMPLANT
SYR CONTROL 10ML LL (SYRINGE) ×4 IMPLANT
TOWEL OR 17X24 6PK STRL BLUE (TOWEL DISPOSABLE) ×2 IMPLANT
TOWEL OR NON WOVEN STRL DISP B (DISPOSABLE) ×2 IMPLANT
TUBE CONNECTING 20X1/4 (TUBING) ×2 IMPLANT
YANKAUER SUCT BULB TIP NO VENT (SUCTIONS) ×2 IMPLANT

## 2013-05-03 NOTE — Transfer of Care (Signed)
Immediate Anesthesia Transfer of Care Note  Patient: Kara Woods  Procedure(s) Performed: Procedure(s): BREAST LUMPECTOMY WITH NEEDLE LOCALIZATION AND AXILLARY SENTINEL LYMPH NODE BX (Left)  Patient Location: PACU  Anesthesia Type:General  Level of Consciousness: awake and alert   Airway & Oxygen Therapy: Patient Spontanous Breathing and Patient connected to face mask oxygen  Post-op Assessment: Report given to PACU RN and Post -op Vital signs reviewed and stable  Post vital signs: Reviewed and stable  Complications: No apparent anesthesia complications

## 2013-05-03 NOTE — H&P (View-Only) (Signed)
Lidocaine 2%              14mL injected                         Left breast biopsies performed 

## 2013-05-03 NOTE — Interval H&P Note (Signed)
History and Physical Interval Note:  05/03/2013 12:41 PM  Kara Woods  has presented today for surgery, with the diagnosis of cancer left breast  The various methods of treatment have been discussed with the patient and family. After consideration of risks, benefits and other options for treatment, the patient has consented to  Procedure(s): BREAST LUMPECTOMY WITH NEEDLE LOCALIZATION AND AXILLARY SENTINEL LYMPH NODE BX (Left) as a surgical intervention .  The patient's history has been reviewed, patient examined, no change in status, stable for surgery.  I have reviewed the patient's chart and labs.  Questions were answered to the patient's satisfaction.     Donyale Berthold T

## 2013-05-03 NOTE — Anesthesia Preprocedure Evaluation (Signed)
Anesthesia Evaluation  Patient identified by MRN, date of birth, ID band Patient awake    Reviewed: Allergy & Precautions, H&P , NPO status , Patient's Chart, lab work & pertinent test results  Airway Mallampati: I TM Distance: >3 FB     Dental  (+) Upper Dentures, Teeth Intact and Dental Advisory Given   Pulmonary  breath sounds clear to auscultation        Cardiovascular + CAD Rhythm:Regular Rate:Normal     Neuro/Psych    GI/Hepatic GERD-  Medicated and Controlled,  Endo/Other    Renal/GU      Musculoskeletal   Abdominal   Peds  Hematology   Anesthesia Other Findings   Reproductive/Obstetrics                           Anesthesia Physical Anesthesia Plan  ASA: III  Anesthesia Plan: General   Post-op Pain Management:    Induction: Intravenous  Airway Management Planned: LMA  Additional Equipment:   Intra-op Plan:   Post-operative Plan: Extubation in OR  Informed Consent: I have reviewed the patients History and Physical, chart, labs and discussed the procedure including the risks, benefits and alternatives for the proposed anesthesia with the patient or authorized representative who has indicated his/her understanding and acceptance.   Dental advisory given  Plan Discussed with: CRNA, Anesthesiologist and Surgeon  Anesthesia Plan Comments:         Anesthesia Quick Evaluation

## 2013-05-03 NOTE — Op Note (Signed)
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 After extensive discussion of surgical treatment options and risks detailed elsewhere we have elected to proceed with breast conservation with lumpectomy and sentinel lymph node biopsy.  Procedure Detail:   Following needle localization at the breast center she was brought to the holding area and 1 mCi of technetium sulfur colloid was injected around the left nipple intradermally by nuclear medicine. She was then brought to the operating room and placed in the supine position on the operating table and laryngeal mask general anesthesia induced. After patient timeout was performed and correct procedure verified 5 cc of dilute methylene blue was injected subcutaneously beneath the left nipple and massaged for several minutes. The entire left breast and chest wall and axilla and upper arm were widely sterilely prepped and draped. Patient timeout was again performed and correct procedure and patient verified. She received IV antibiotics. Needle localization was performed previously as noted and the mass was also palpable in the upper outer quadrant and felt fairly close to the skin in this area. I therefore used a curvilinear incision and excised a crescent of skin directly overlying the mass. Short skin and subcutaneous flaps were then raised and the dissection was deepened down into the breast tissue. The wire was brought into the incision from its inferior placement. Staying away from the mass I then deepened the  dissection down to the chest wall as this was a fairly thin area of the breast. The mass and wire were dissected up off of the chest wall and removed. Grossly margins all seemed good although the inferior margin felt relatively a little closer. I did excise further inferior margin and the margin was oriented and this was sent for permanent pathology. The main specimen was inked for orientation. The specimen x-ray showed the clip and the wire and the mass in the center of the specimen. This was sent for permanent pathology. The excision was down to the chest wall but I did also separately excised the pectoralis fascia immediately beneath the mass and sent this as a separate specimen. The wound was thoroughly irrigated and hemostasis obtained. The soft tissue was infiltrated with Marcaine. The cavity was marked with clips. I then mobilized the breast to some degree off the chest wall inferiorly, superiorly and laterally and brought this mobilized breast tissue into the lumpectomy cavity and it was secured to the anterior chest wall and closed with interrupted 3-0 Vicryl was to at least partially obliterate the lumpectomy cavity. The subcutaneous was closed with interrupted 3-0 Vicryl the skin with a running subcuticular 4-0 Monocryl. Attention was turned to the sentinel node. A hot area in the left axilla was noted and a small transverse incision made and dissection carried down through the subcutaneous tissue using cautery. The clavipectoral fascia was incised. Using the neoprobe for guidance I bluntly dissected down onto a blue lymphatic and normal sized blue lymph node with high counts. This was completely excised with cautery. Ex vivo the node had counts in excess of 3800 with background in the axilla no greater than about 150. This was  sent as hot blue left axillary sentinel lymph node. The soft tissue and this incision was infiltrated with Marcaine and hemostasis assured. The wound was closed in layers with  interrupted 3-0 Vicryl and the skin with subcuticular Monocryl. Dermabond was used on both incisions. Sponge needle and instrument counts were correct.   Estimated Blood Loss:  Minimal         Drains: none  Blood Given: none          Specimens: #1 left breast lumpectomy  #2 further inferior margin #3 posterior fascia  #4 left axillary hot blue sentinel lymph node        Complications:  * No complications entered in OR log *         Disposition: PACU - hemodynamically stable.         Condition: stable

## 2013-05-03 NOTE — Anesthesia Procedure Notes (Signed)
Procedure Name: LMA Insertion Date/Time: 05/03/2013 1:01 PM Performed by: Caren Macadam Pre-anesthesia Checklist: Patient identified, Emergency Drugs available, Suction available and Patient being monitored Patient Re-evaluated:Patient Re-evaluated prior to inductionOxygen Delivery Method: Circle System Utilized Preoxygenation: Pre-oxygenation with 100% oxygen Intubation Type: IV induction Ventilation: Mask ventilation without difficulty LMA: LMA inserted LMA Size: 4.0 Number of attempts: 1 Airway Equipment and Method: bite block Placement Confirmation: positive ETCO2 Tube secured with: Tape Dental Injury: Teeth and Oropharynx as per pre-operative assessment

## 2013-05-03 NOTE — Anesthesia Postprocedure Evaluation (Signed)
  Anesthesia Post-op Note  Patient: Kara Woods  Procedure(s) Performed: Procedure(s): BREAST LUMPECTOMY WITH NEEDLE LOCALIZATION AND AXILLARY SENTINEL LYMPH NODE BX (Left)  Patient Location: PACU  Anesthesia Type:General  Level of Consciousness: awake, alert  and oriented  Airway and Oxygen Therapy: Patient Spontanous Breathing and Patient connected to face mask oxygen  Post-op Pain: mild  Post-op Assessment: Post-op Vital signs reviewed  Post-op Vital Signs: Reviewed  Complications: No apparent anesthesia complications

## 2013-05-04 ENCOUNTER — Encounter (HOSPITAL_BASED_OUTPATIENT_CLINIC_OR_DEPARTMENT_OTHER): Payer: Self-pay | Admitting: General Surgery

## 2013-05-04 LAB — POCT HEMOGLOBIN-HEMACUE: Hemoglobin: 13.8 g/dL (ref 12.0–15.0)

## 2013-05-06 ENCOUNTER — Telehealth (INDEPENDENT_AMBULATORY_CARE_PROVIDER_SITE_OTHER): Payer: Self-pay | Admitting: General Surgery

## 2013-05-06 ENCOUNTER — Telehealth (INDEPENDENT_AMBULATORY_CARE_PROVIDER_SITE_OTHER): Payer: Self-pay

## 2013-05-06 NOTE — Telephone Encounter (Signed)
Call the patient and discuss pathology report with positive margin anteriorly. Inferior and posterior margins were focally positive on the original specimen but I excised further tissue here that was negative. I talked to her daughter as the patient was napping. Get her into the office for short-term followup to discuss the next step. She will need reexcision of  anterior margin which will include more skin.

## 2013-05-06 NOTE — Telephone Encounter (Signed)
Patient would like path results . Informed he would have Dr. Evonnie Pat CMA call her

## 2013-05-13 ENCOUNTER — Ambulatory Visit (INDEPENDENT_AMBULATORY_CARE_PROVIDER_SITE_OTHER): Payer: Medicare Other | Admitting: General Surgery

## 2013-05-13 ENCOUNTER — Encounter (INDEPENDENT_AMBULATORY_CARE_PROVIDER_SITE_OTHER): Payer: Self-pay | Admitting: General Surgery

## 2013-05-13 ENCOUNTER — Encounter (HOSPITAL_BASED_OUTPATIENT_CLINIC_OR_DEPARTMENT_OTHER): Payer: Self-pay | Admitting: *Deleted

## 2013-05-13 VITALS — BP 122/74 | HR 76 | Temp 97.0°F | Resp 16 | Ht 66.5 in | Wt 164.2 lb

## 2013-05-13 DIAGNOSIS — C50912 Malignant neoplasm of unspecified site of left female breast: Secondary | ICD-10-CM

## 2013-05-13 DIAGNOSIS — C50919 Malignant neoplasm of unspecified site of unspecified female breast: Secondary | ICD-10-CM

## 2013-05-13 NOTE — Progress Notes (Signed)
Pt here 2 weks ago -needs re-exc-did well-much less nervous this time.

## 2013-05-13 NOTE — Progress Notes (Signed)
History: The patient returns following left breast lumpectomy and axillary sentinel lymph node biopsy for invasive lobular cancer. She reports soreness but no other problems. We had previously discussed her pathology. This showed a negative sentinel lymph node. Free margins were positive but the inferior and posterior margins had further tissue excised at the time of her lumpectomy and were negative. Her tumor was 1.4 cm. The anterior margin was positive.  Exam: BP 122/74  Pulse 76  Temp(Src) 97 F (36.1 C) (Oral)  Resp 16  Ht 5' 6.5" (1.689 m)  Wt 164 lb 3.2 oz (74.481 kg)  BMI 26.11 kg/m2 General: Appears well Breasts: Her breasts and axillary incisions are healing very nicely without complication  Assessment and plan: Status post lumpectomy for invasive lobular cancer.  She still has a positive anterior margin. I did take skin anterior to the tumor. I recommended reexcision and I would plan to excise further skin and subcutaneous tissue anteriorly. We discussed that this could result in some deformity of the breast but I don't think this will be too bad. Alternative mastectomy was discussed which I do not believe is necessary at this point and she strongly desires breast conservation. Will plan reexcision of the anterior margin as soon as possible. She understands that I cannot guarantee that this will clear her margins. 

## 2013-05-19 ENCOUNTER — Ambulatory Visit (HOSPITAL_BASED_OUTPATIENT_CLINIC_OR_DEPARTMENT_OTHER): Payer: Medicare Other | Admitting: Anesthesiology

## 2013-05-19 ENCOUNTER — Encounter (HOSPITAL_BASED_OUTPATIENT_CLINIC_OR_DEPARTMENT_OTHER): Payer: Self-pay | Admitting: *Deleted

## 2013-05-19 ENCOUNTER — Encounter (HOSPITAL_BASED_OUTPATIENT_CLINIC_OR_DEPARTMENT_OTHER): Payer: Self-pay | Admitting: Anesthesiology

## 2013-05-19 ENCOUNTER — Encounter (HOSPITAL_BASED_OUTPATIENT_CLINIC_OR_DEPARTMENT_OTHER)
Admission: RE | Disposition: A | Discharge status: Home / Self Care | Payer: Self-pay | Source: Ambulatory Visit | Attending: General Surgery

## 2013-05-19 ENCOUNTER — Ambulatory Visit (HOSPITAL_BASED_OUTPATIENT_CLINIC_OR_DEPARTMENT_OTHER)
Admission: RE | Admit: 2013-05-19 | Discharge: 2013-05-19 | Disposition: A | Discharge status: Home / Self Care | Payer: Medicare Other | Source: Ambulatory Visit | Attending: General Surgery | Admitting: General Surgery

## 2013-05-19 DIAGNOSIS — C50912 Malignant neoplasm of unspecified site of left female breast: Secondary | ICD-10-CM

## 2013-05-19 DIAGNOSIS — E669 Obesity, unspecified: Secondary | ICD-10-CM | POA: Diagnosis not present

## 2013-05-19 DIAGNOSIS — Z87891 Personal history of nicotine dependence: Secondary | ICD-10-CM | POA: Diagnosis not present

## 2013-05-19 DIAGNOSIS — N6039 Fibrosclerosis of unspecified breast: Secondary | ICD-10-CM | POA: Diagnosis not present

## 2013-05-19 DIAGNOSIS — Z6826 Body mass index (BMI) 26.0-26.9, adult: Secondary | ICD-10-CM | POA: Insufficient documentation

## 2013-05-19 DIAGNOSIS — I2510 Atherosclerotic heart disease of native coronary artery without angina pectoris: Secondary | ICD-10-CM | POA: Diagnosis not present

## 2013-05-19 DIAGNOSIS — C50919 Malignant neoplasm of unspecified site of unspecified female breast: Secondary | ICD-10-CM | POA: Insufficient documentation

## 2013-05-19 DIAGNOSIS — K219 Gastro-esophageal reflux disease without esophagitis: Secondary | ICD-10-CM | POA: Insufficient documentation

## 2013-05-19 HISTORY — PX: BREAST SURGERY: SHX581

## 2013-05-19 HISTORY — PX: RE-EXCISION OF BREAST LUMPECTOMY: SHX6048

## 2013-05-19 SURGERY — EXCISION, LESION, BREAST
Anesthesia: General | Site: Breast | Laterality: Left | Wound class: Clean

## 2013-05-19 MED ORDER — FENTANYL CITRATE 0.05 MG/ML IJ SOLN
INTRAMUSCULAR | Status: DC | PRN
  Administered 2013-05-19: 50 ug via INTRAVENOUS

## 2013-05-19 MED ORDER — LIDOCAINE HCL (CARDIAC) 20 MG/ML IV SOLN
INTRAVENOUS | Status: DC | PRN
  Administered 2013-05-19: 50 mg via INTRAVENOUS

## 2013-05-19 MED ORDER — MIDAZOLAM HCL 5 MG/5ML IJ SOLN
INTRAMUSCULAR | Status: DC | PRN
  Administered 2013-05-19: 1 mg via INTRAVENOUS

## 2013-05-19 MED ORDER — LACTATED RINGERS IV SOLN
INTRAVENOUS | Status: DC
  Administered 2013-05-19: 10:00:00 via INTRAVENOUS

## 2013-05-19 MED ORDER — PROMETHAZINE HCL 25 MG/ML IJ SOLN: 6.2500 mg | INTRAMUSCULAR | Status: DC | PRN

## 2013-05-19 MED ORDER — MIDAZOLAM HCL 2 MG/2ML IJ SOLN: 1.0000 mg | INTRAMUSCULAR | Status: DC | PRN

## 2013-05-19 MED ORDER — EPHEDRINE SULFATE 50 MG/ML IJ SOLN
INTRAMUSCULAR | Status: DC | PRN
  Administered 2013-05-19 (×2): 10 mg via INTRAVENOUS

## 2013-05-19 MED ORDER — ONDANSETRON HCL 4 MG/2ML IJ SOLN
INTRAMUSCULAR | Status: DC | PRN
  Administered 2013-05-19: 4 mg via INTRAVENOUS

## 2013-05-19 MED ORDER — FENTANYL CITRATE 0.05 MG/ML IJ SOLN: 50.0000 ug | INTRAMUSCULAR | Status: DC | PRN

## 2013-05-19 MED ORDER — PROPOFOL 10 MG/ML IV BOLUS
INTRAVENOUS | Status: DC | PRN
  Administered 2013-05-19: 150 mg via INTRAVENOUS

## 2013-05-19 MED ORDER — BUPIVACAINE-EPINEPHRINE 0.25% -1:200000 IJ SOLN
INTRAMUSCULAR | Status: DC | PRN
  Administered 2013-05-19: 15 mL

## 2013-05-19 MED ORDER — OXYCODONE HCL 5 MG/5ML PO SOLN: 5.0000 mg | Freq: Once | ORAL | Status: DC | PRN

## 2013-05-19 MED ORDER — HYDROMORPHONE HCL PF 1 MG/ML IJ SOLN
0.2500 mg | INTRAMUSCULAR | Status: DC | PRN
  Administered 2013-05-19 (×2): 0.25 mg via INTRAVENOUS
  Administered 2013-05-19: 0.5 mg via INTRAVENOUS

## 2013-05-19 MED ORDER — OXYCODONE HCL 5 MG PO TABS: 5.0000 mg | ORAL_TABLET | Freq: Once | ORAL | Status: DC | PRN

## 2013-05-19 MED ORDER — MEPERIDINE HCL 25 MG/ML IJ SOLN: 6.2500 mg | INTRAMUSCULAR | Status: DC | PRN

## 2013-05-19 MED ORDER — MIDAZOLAM HCL 2 MG/2ML IJ SOLN: 0.5000 mg | Freq: Once | INTRAMUSCULAR | Status: DC | PRN

## 2013-05-19 MED ORDER — DEXAMETHASONE SODIUM PHOSPHATE 4 MG/ML IJ SOLN
INTRAMUSCULAR | Status: DC | PRN
  Administered 2013-05-19: 8 mg via INTRAVENOUS

## 2013-05-19 MED ORDER — CEFAZOLIN SODIUM-DEXTROSE 2-3 GM-% IV SOLR
2.0000 g | INTRAVENOUS | Status: AC
  Administered 2013-05-19: 2 g via INTRAVENOUS

## 2013-05-19 SURGICAL SUPPLY — 47 items
ADH SKN CLS APL DERMABOND .7 (GAUZE/BANDAGES/DRESSINGS) ×2
BLADE SURG 10 STRL SS (BLADE) IMPLANT
BLADE SURG 15 STRL LF DISP TIS (BLADE) ×1 IMPLANT
BLADE SURG 15 STRL SS (BLADE) ×2
CANISTER SUCTION 1200CC (MISCELLANEOUS) IMPLANT
CHLORAPREP W/TINT 26ML (MISCELLANEOUS) ×2 IMPLANT
CLIP TI MEDIUM 6 (CLIP) IMPLANT
CLIP TI WIDE RED SMALL 6 (CLIP) IMPLANT
CLOTH BEACON ORANGE TIMEOUT ST (SAFETY) ×2 IMPLANT
COVER MAYO STAND STRL (DRAPES) ×2 IMPLANT
COVER TABLE BACK 60X90 (DRAPES) ×2 IMPLANT
DERMABOND ADVANCED (GAUZE/BANDAGES/DRESSINGS) ×2
DERMABOND ADVANCED .7 DNX12 (GAUZE/BANDAGES/DRESSINGS) ×2 IMPLANT
DEVICE DUBIN W/COMP PLATE 8390 (MISCELLANEOUS) IMPLANT
DRAPE PED LAPAROTOMY (DRAPES) ×2 IMPLANT
DRAPE UTILITY XL STRL (DRAPES) ×2 IMPLANT
ELECT COATED BLADE 2.86 ST (ELECTRODE) ×2 IMPLANT
ELECT REM PT RETURN 9FT ADLT (ELECTROSURGICAL) ×2
ELECTRODE REM PT RTRN 9FT ADLT (ELECTROSURGICAL) ×1 IMPLANT
GLOVE BIOGEL PI IND STRL 8 (GLOVE) ×1 IMPLANT
GLOVE BIOGEL PI INDICATOR 8 (GLOVE) ×1
GLOVE SS BIOGEL STRL SZ 7.5 (GLOVE) ×1 IMPLANT
GLOVE SUPERSENSE BIOGEL SZ 7.5 (GLOVE) ×1
GOWN PREVENTION PLUS XLARGE (GOWN DISPOSABLE) IMPLANT
GOWN PREVENTION PLUS XXLARGE (GOWN DISPOSABLE) ×4 IMPLANT
KIT MARKER MARGIN INK (KITS) IMPLANT
NEEDLE HYPO 25X1 1.5 SAFETY (NEEDLE) ×2 IMPLANT
NS IRRIG 1000ML POUR BTL (IV SOLUTION) ×2 IMPLANT
PACK BASIN DAY SURGERY FS (CUSTOM PROCEDURE TRAY) ×2 IMPLANT
PENCIL BUTTON HOLSTER BLD 10FT (ELECTRODE) ×2 IMPLANT
SLEEVE SCD COMPRESS KNEE MED (MISCELLANEOUS) ×2 IMPLANT
STAPLER VISISTAT 35W (STAPLE) IMPLANT
SUT MNCRL AB 4-0 PS2 18 (SUTURE) ×2 IMPLANT
SUT MON AB 3-0 SH 27 (SUTURE)
SUT MON AB 3-0 SH27 (SUTURE) IMPLANT
SUT MON AB 5-0 PS2 18 (SUTURE) IMPLANT
SUT SILK 3 0 SH 30 (SUTURE) ×2 IMPLANT
SUT VIC AB 3-0 SH 27 (SUTURE)
SUT VIC AB 3-0 SH 27X BRD (SUTURE) IMPLANT
SUT VIC AB 4-0 BRD 54 (SUTURE) IMPLANT
SUT VICRYL 3-0 CR8 SH (SUTURE) ×2 IMPLANT
SYR BULB 3OZ (MISCELLANEOUS) IMPLANT
SYR CONTROL 10ML LL (SYRINGE) ×2 IMPLANT
TOWEL OR 17X24 6PK STRL BLUE (TOWEL DISPOSABLE) ×4 IMPLANT
TOWEL OR NON WOVEN STRL DISP B (DISPOSABLE) ×2 IMPLANT
TUBE CONNECTING 20X1/4 (TUBING) IMPLANT
YANKAUER SUCT BULB TIP NO VENT (SUCTIONS) IMPLANT

## 2013-05-19 NOTE — Interval H&P Note (Signed)
History and Physical Interval Note:  05/19/2013 10:54 AM  Kara Woods  has presented today for surgery, with the diagnosis of cancer left breast  The various methods of treatment have been discussed with the patient and family. After consideration of risks, benefits and other options for treatment, the patient has consented to  Procedure(s): RE-EXCISION OF BREAST LUMPECTOMY (Left) as a surgical intervention .  The patient's history has been reviewed, patient examined, no change in status, stable for surgery.  I have reviewed the patient's chart and labs.  Questions were answered to the patient's satisfaction.     Jolane Bankhead T

## 2013-05-19 NOTE — Anesthesia Postprocedure Evaluation (Signed)
  Anesthesia Post-op Note  Patient: Kara Woods  Procedure(s) Performed: Procedure(s): RE-EXCISION OF BREAST LUMPECTOMY (Left)  Patient Location: PACU  Anesthesia Type:General  Level of Consciousness: awake, alert , oriented and patient cooperative  Airway and Oxygen Therapy: Patient Spontanous Breathing  Post-op Pain: none  Post-op Assessment: Post-op Vital signs reviewed, Patient's Cardiovascular Status Stable, Respiratory Function Stable, Patent Airway, No signs of Nausea or vomiting and Pain level controlled  Post-op Vital Signs: Reviewed and stable  Complications: No apparent anesthesia complications

## 2013-05-19 NOTE — Anesthesia Preprocedure Evaluation (Addendum)
Anesthesia Evaluation  Patient identified by MRN, date of birth, ID band Patient awake    Reviewed: Allergy & Precautions, H&P , NPO status , Patient's Chart, lab work & pertinent test results, reviewed documented beta blocker date and time   History of Anesthesia Complications Negative for: history of anesthetic complications  Airway Mallampati: I TM Distance: >3 FB Neck ROM: Full    Dental  (+) Upper Dentures and Dental Advisory Given   Pulmonary neg pulmonary ROS, neg shortness of breath, former smoker (quit 20+ years),  breath sounds clear to auscultation  Pulmonary exam normal       Cardiovascular - CAD Rhythm:Regular Rate:Normal  '03 ECHO: normal LVF, EF 65%, valves  Remote cath and cardiolite: both normal   Neuro/Psych negative neurological ROS     GI/Hepatic Neg liver ROS, GERD-  Medicated and Controlled,  Endo/Other  negative endocrine ROS  Renal/GU negative Renal ROS     Musculoskeletal   Abdominal (+) - obese,   Peds  Hematology   Anesthesia Other Findings   Reproductive/Obstetrics                         Anesthesia Physical Anesthesia Plan  ASA: II  Anesthesia Plan: General   Post-op Pain Management:    Induction: Intravenous  Airway Management Planned: LMA  Additional Equipment:   Intra-op Plan:   Post-operative Plan:   Informed Consent: I have reviewed the patients History and Physical, chart, labs and discussed the procedure including the risks, benefits and alternatives for the proposed anesthesia with the patient or authorized representative who has indicated his/her understanding and acceptance.   Dental advisory given  Plan Discussed with: CRNA and Surgeon  Anesthesia Plan Comments: (Plan routine monitors, GA- LMA OK)        Anesthesia Quick Evaluation

## 2013-05-19 NOTE — Anesthesia Procedure Notes (Signed)
Procedure Name: LMA Insertion Date/Time: 05/19/2013 11:06 AM Performed by: Zenia Resides D Pre-anesthesia Checklist: Patient identified, Emergency Drugs available, Suction available and Patient being monitored Patient Re-evaluated:Patient Re-evaluated prior to inductionOxygen Delivery Method: Circle System Utilized Preoxygenation: Pre-oxygenation with 100% oxygen Intubation Type: IV induction Ventilation: Mask ventilation without difficulty LMA: LMA inserted LMA Size: 4.0 Number of attempts: 1 Airway Equipment and Method: bite block Placement Confirmation: positive ETCO2 Tube secured with: Tape Dental Injury: Teeth and Oropharynx as per pre-operative assessment

## 2013-05-19 NOTE — Op Note (Signed)
Preoperative Diagnosis: cancer left breast  Postoprative Diagnosis: cancer left breast  Procedure: Procedure(s): RE-EXCISION OF BREAST LUMPECTOMY   Surgeon: Glenna Fellows T   Assistants: None  Anesthesia:  General LMA anesthesia  Indications:   Patient is a 79 year old female recently status post left breast lumpectomy and left axillary sentinel lymph node biopsy for invasive lobular carcinoma of the upper-inner quadrant of the left breast. Final pathology showed a positive anterior margin of the lumpectomy. I have recommended proceeding with reexcision of her anterior margin. We have discussed the indication for the procedure and risks of bleeding, infection and possible failure to obtain negative margins. She understands and agrees.  Procedure Detail:   patient brought to the operating patient and procedure verified., placed in the supine position on the operating table, a laryngeal mask general anesthesia induced. She received preoperative IV antibiotics. The left breast was widely sterilely prepped and draped and patient timeout was performed and correct procedure verified. I had previously taken and the lips of skin overlying the tumor and created short skin and subcutaneous flaps due to its anterior location. I therefore planned further skin excision. The previous incision was opened in the lumpectomy cavity widely opened and seroma drained. I then elliptically excised a portion of skin from the upper and lower side of the transversely oriented incision. I then dissected through the subcutaneous tissue back superiorly and inferiorly on either side therefore taking skin and subcutaneous and breast tissue over the entire anterior margin. This was sent as separate specimens with the upper and lower portion separate and the new surface margin marked with sutures. Following this the wound was here again and complete hemostasis obtained with cautery. The cavity was closed partially suturing  some mobilized inferior and lateral breast tissue back under the incision to the chest wall and then the subcutaneous tissue was closed with interrupted 3-0 Vicryl as well and the skin was closed with running subcuticular 4-0 Monocryl and Dermabond. Sponge needle and management counts were correct.   Estimated Blood Loss:  Minimal         Drains: none  Blood Given: none          Specimens: further anterior margin of lumpectomy site including skin and subcutaneous tissue.        Complications:  * No complications entered in OR log *         Disposition: PACU - hemodynamically stable.         Condition: stable

## 2013-05-19 NOTE — H&P (View-Only) (Signed)
History: The patient returns following left breast lumpectomy and axillary sentinel lymph node biopsy for invasive lobular cancer. She reports soreness but no other problems. We had previously discussed her pathology. This showed a negative sentinel lymph node. Free margins were positive but the inferior and posterior margins had further tissue excised at the time of her lumpectomy and were negative. Her tumor was 1.4 cm. The anterior margin was positive.  Exam: BP 122/74  Pulse 76  Temp(Src) 97 F (36.1 C) (Oral)  Resp 16  Ht 5' 6.5" (1.689 m)  Wt 164 lb 3.2 oz (74.481 kg)  BMI 26.11 kg/m2 General: Appears well Breasts: Her breasts and axillary incisions are healing very nicely without complication  Assessment and plan: Status post lumpectomy for invasive lobular cancer.  She still has a positive anterior margin. I did take skin anterior to the tumor. I recommended reexcision and I would plan to excise further skin and subcutaneous tissue anteriorly. We discussed that this could result in some deformity of the breast but I don't think this will be too bad. Alternative mastectomy was discussed which I do not believe is necessary at this point and she strongly desires breast conservation. Will plan reexcision of the anterior margin as soon as possible. She understands that I cannot guarantee that this will clear her margins.

## 2013-05-19 NOTE — Transfer of Care (Signed)
Immediate Anesthesia Transfer of Care Note  Patient: Kara Woods  Procedure(s) Performed: Procedure(s): RE-EXCISION OF BREAST LUMPECTOMY (Left)  Patient Location: PACU  Anesthesia Type:General  Level of Consciousness: awake, alert  and oriented  Airway & Oxygen Therapy: Patient Spontanous Breathing and Patient connected to face mask oxygen  Post-op Assessment: Report given to PACU RN and Post -op Vital signs reviewed and stable  Post vital signs: Reviewed and stable  Complications: No apparent anesthesia complications

## 2013-05-20 ENCOUNTER — Encounter (INDEPENDENT_AMBULATORY_CARE_PROVIDER_SITE_OTHER): Payer: Medicare Other | Admitting: General Surgery

## 2013-05-20 ENCOUNTER — Encounter (HOSPITAL_BASED_OUTPATIENT_CLINIC_OR_DEPARTMENT_OTHER): Payer: Self-pay | Admitting: General Surgery

## 2013-05-20 ENCOUNTER — Telehealth (INDEPENDENT_AMBULATORY_CARE_PROVIDER_SITE_OTHER): Payer: Self-pay | Admitting: General Surgery

## 2013-05-20 NOTE — Telephone Encounter (Signed)
Patient made aware of appt date/time.  

## 2013-05-20 NOTE — Telephone Encounter (Signed)
Message copied by Liliana Cline on Thu May 20, 2013  2:14 PM ------      Message from: Maryan Puls      Created: Thu May 20, 2013  1:31 PM      Regarding: appt       I scheduled her for 06/10/13 @ 8:50 w/Dr. Johna Sheriff.  I had to wait for him to let me know if he's okay with this date.        I haven't called patient yet.            Christy      ----- Message -----         From: Liliana Cline, CMA         Sent: 05/20/2013   9:56 AM           To: Maryan Puls, CMA            Re-excision left lumpectomy by [facility] 05/19/2013      Where would you like me to schedule post op appt?            Joella Saefong       ------

## 2013-05-21 ENCOUNTER — Telehealth (INDEPENDENT_AMBULATORY_CARE_PROVIDER_SITE_OTHER): Payer: Self-pay

## 2013-05-21 NOTE — Telephone Encounter (Signed)
Called and left message to call our office back.  Pathology results are not available at this time.  It can take up to 3-5 day's when surgery falls a few day's before the weekend.

## 2013-05-21 NOTE — Telephone Encounter (Signed)
Spoke to [employer] and made her aware that pathology results are not back and it may be as last as mid week before results are available due to weekend.

## 2013-05-21 NOTE — Telephone Encounter (Signed)
Mrs. Kara Woods  Is asking for path report results  for patient please call [phone]

## 2013-05-24 ENCOUNTER — Telehealth (INDEPENDENT_AMBULATORY_CARE_PROVIDER_SITE_OTHER): Payer: Self-pay | Admitting: General Surgery

## 2013-05-24 NOTE — Telephone Encounter (Signed)
Norco 5-325 mg Take 1-2 tablets every 4 hours as needed for pain. #30 No Refills was refilled at [facility]

## 2013-05-31 ENCOUNTER — Telehealth (INDEPENDENT_AMBULATORY_CARE_PROVIDER_SITE_OTHER): Payer: Self-pay | Admitting: General Surgery

## 2013-05-31 NOTE — Telephone Encounter (Signed)
Called pt and discussed path report and planned followup

## 2013-06-07 ENCOUNTER — Telehealth (INDEPENDENT_AMBULATORY_CARE_PROVIDER_SITE_OTHER): Payer: Self-pay

## 2013-06-07 ENCOUNTER — Other Ambulatory Visit (INDEPENDENT_AMBULATORY_CARE_PROVIDER_SITE_OTHER): Payer: Self-pay

## 2013-06-07 NOTE — Telephone Encounter (Signed)
Called and left message for patient daughter Selena Batten) to discuss Radiation & Medical Oncology Consults that are scheduled for patient.  Patient is scheduled with Radiation Oncology 06/15/13 @ 7:45 am arrival time of 7:15.  Medical Oncology 06/09/13 @ 3:00 with arrival time of 2:45 pm.  Both appointments to Oncology are scheduled at [facility].

## 2013-06-09 DIAGNOSIS — C50919 Malignant neoplasm of unspecified site of unspecified female breast: Secondary | ICD-10-CM

## 2013-06-10 ENCOUNTER — Ambulatory Visit (INDEPENDENT_AMBULATORY_CARE_PROVIDER_SITE_OTHER): Payer: Medicare Other | Admitting: General Surgery

## 2013-06-10 ENCOUNTER — Encounter (INDEPENDENT_AMBULATORY_CARE_PROVIDER_SITE_OTHER): Payer: Self-pay | Admitting: General Surgery

## 2013-06-10 VITALS — BP 124/80 | HR 80 | Temp 97.3°F | Resp 16 | Ht 66.5 in | Wt 162.0 lb

## 2013-06-10 DIAGNOSIS — C50919 Malignant neoplasm of unspecified site of unspecified female breast: Secondary | ICD-10-CM

## 2013-06-10 DIAGNOSIS — C50912 Malignant neoplasm of unspecified site of left female breast: Secondary | ICD-10-CM

## 2013-06-10 MED ORDER — TRAMADOL HCL 50 MG PO TABS: 50.0000 mg | ORAL_TABLET | Freq: Four times a day (QID) | ORAL | Status: DC | PRN

## 2013-06-10 NOTE — Progress Notes (Signed)
   chief complaint: Followup lumpectomy  History: Patient returns for followup status post lumpectomy and reexcision for positive margin for invasive lobular carcinoma. She still has some pain at the lumpectomy site this is getting better. She has seen medical oncology and has an appointment with radiation oncology at [facility] does not have these recommendations yet.  Exam: Left breast is healing well without hematoma or seroma or infection or other complications  Assessment and plan: Doing well following lobectomy. All margins are negative. Radiation as planned. Return in 3 months.

## 2013-06-11 ENCOUNTER — Telehealth (INDEPENDENT_AMBULATORY_CARE_PROVIDER_SITE_OTHER): Payer: Self-pay

## 2013-06-11 NOTE — Telephone Encounter (Signed)
Called to request a copy of office visit (06/09/13) Physician notes

## 2013-06-14 ENCOUNTER — Telehealth (INDEPENDENT_AMBULATORY_CARE_PROVIDER_SITE_OTHER): Payer: Self-pay

## 2013-06-14 NOTE — Telephone Encounter (Signed)
Patient calling into office to speak with Dr. Johna Sheriff.  Office dictation from 06/09/13 at Oncologist was requested on 06/11/13.  Patient aware that we have not received the office notes from [facility] cancer center.

## 2013-06-15 DIAGNOSIS — Z510 Encounter for antineoplastic radiation therapy: Secondary | ICD-10-CM | POA: Diagnosis not present

## 2013-06-15 DIAGNOSIS — Z9889 Other specified postprocedural states: Secondary | ICD-10-CM | POA: Diagnosis not present

## 2013-06-15 DIAGNOSIS — C50919 Malignant neoplasm of unspecified site of unspecified female breast: Secondary | ICD-10-CM | POA: Diagnosis not present

## 2013-06-15 DIAGNOSIS — Z79899 Other long term (current) drug therapy: Secondary | ICD-10-CM | POA: Diagnosis not present

## 2013-06-15 DIAGNOSIS — Z808 Family history of malignant neoplasm of other organs or systems: Secondary | ICD-10-CM | POA: Diagnosis not present

## 2013-06-15 DIAGNOSIS — H5440 Blindness, one eye, unspecified eye: Secondary | ICD-10-CM | POA: Diagnosis not present

## 2013-06-15 DIAGNOSIS — Z87891 Personal history of nicotine dependence: Secondary | ICD-10-CM | POA: Diagnosis not present

## 2013-06-15 DIAGNOSIS — Z170 Estrogen receptor positive status [ER+]: Secondary | ICD-10-CM | POA: Diagnosis not present

## 2013-06-15 DIAGNOSIS — F411 Generalized anxiety disorder: Secondary | ICD-10-CM | POA: Diagnosis not present

## 2013-06-15 DIAGNOSIS — K219 Gastro-esophageal reflux disease without esophagitis: Secondary | ICD-10-CM | POA: Diagnosis not present

## 2013-06-15 DIAGNOSIS — C50219 Malignant neoplasm of upper-inner quadrant of unspecified female breast: Secondary | ICD-10-CM | POA: Diagnosis not present

## 2013-06-15 DIAGNOSIS — M129 Arthropathy, unspecified: Secondary | ICD-10-CM | POA: Diagnosis not present

## 2013-06-16 DIAGNOSIS — M129 Arthropathy, unspecified: Secondary | ICD-10-CM | POA: Diagnosis not present

## 2013-06-16 DIAGNOSIS — K219 Gastro-esophageal reflux disease without esophagitis: Secondary | ICD-10-CM | POA: Diagnosis not present

## 2013-06-16 DIAGNOSIS — C50919 Malignant neoplasm of unspecified site of unspecified female breast: Secondary | ICD-10-CM | POA: Diagnosis not present

## 2013-06-16 DIAGNOSIS — H5440 Blindness, one eye, unspecified eye: Secondary | ICD-10-CM | POA: Diagnosis not present

## 2013-06-16 DIAGNOSIS — Z510 Encounter for antineoplastic radiation therapy: Secondary | ICD-10-CM | POA: Diagnosis not present

## 2013-06-16 DIAGNOSIS — F411 Generalized anxiety disorder: Secondary | ICD-10-CM | POA: Diagnosis not present

## 2013-06-17 DIAGNOSIS — L0100 Impetigo, unspecified: Secondary | ICD-10-CM | POA: Diagnosis not present

## 2013-06-17 DIAGNOSIS — F411 Generalized anxiety disorder: Secondary | ICD-10-CM | POA: Diagnosis not present

## 2013-06-18 DIAGNOSIS — F411 Generalized anxiety disorder: Secondary | ICD-10-CM | POA: Diagnosis not present

## 2013-06-18 DIAGNOSIS — M129 Arthropathy, unspecified: Secondary | ICD-10-CM | POA: Diagnosis not present

## 2013-06-18 DIAGNOSIS — C50919 Malignant neoplasm of unspecified site of unspecified female breast: Secondary | ICD-10-CM | POA: Diagnosis not present

## 2013-06-18 DIAGNOSIS — K219 Gastro-esophageal reflux disease without esophagitis: Secondary | ICD-10-CM | POA: Diagnosis not present

## 2013-06-18 DIAGNOSIS — H5440 Blindness, one eye, unspecified eye: Secondary | ICD-10-CM | POA: Diagnosis not present

## 2013-06-18 DIAGNOSIS — Z510 Encounter for antineoplastic radiation therapy: Secondary | ICD-10-CM | POA: Diagnosis not present

## 2013-06-22 DIAGNOSIS — M129 Arthropathy, unspecified: Secondary | ICD-10-CM | POA: Diagnosis not present

## 2013-06-22 DIAGNOSIS — Z510 Encounter for antineoplastic radiation therapy: Secondary | ICD-10-CM | POA: Diagnosis not present

## 2013-06-22 DIAGNOSIS — K219 Gastro-esophageal reflux disease without esophagitis: Secondary | ICD-10-CM | POA: Diagnosis not present

## 2013-06-22 DIAGNOSIS — F411 Generalized anxiety disorder: Secondary | ICD-10-CM | POA: Diagnosis not present

## 2013-06-22 DIAGNOSIS — C50919 Malignant neoplasm of unspecified site of unspecified female breast: Secondary | ICD-10-CM | POA: Diagnosis not present

## 2013-06-22 DIAGNOSIS — H5440 Blindness, one eye, unspecified eye: Secondary | ICD-10-CM | POA: Diagnosis not present

## 2013-06-23 DIAGNOSIS — C50919 Malignant neoplasm of unspecified site of unspecified female breast: Secondary | ICD-10-CM | POA: Diagnosis not present

## 2013-06-23 DIAGNOSIS — F411 Generalized anxiety disorder: Secondary | ICD-10-CM | POA: Diagnosis not present

## 2013-06-23 DIAGNOSIS — M129 Arthropathy, unspecified: Secondary | ICD-10-CM | POA: Diagnosis not present

## 2013-06-23 DIAGNOSIS — Z510 Encounter for antineoplastic radiation therapy: Secondary | ICD-10-CM | POA: Diagnosis not present

## 2013-06-23 DIAGNOSIS — K219 Gastro-esophageal reflux disease without esophagitis: Secondary | ICD-10-CM | POA: Diagnosis not present

## 2013-06-23 DIAGNOSIS — H5440 Blindness, one eye, unspecified eye: Secondary | ICD-10-CM | POA: Diagnosis not present

## 2013-06-24 DIAGNOSIS — K219 Gastro-esophageal reflux disease without esophagitis: Secondary | ICD-10-CM | POA: Diagnosis not present

## 2013-06-24 DIAGNOSIS — H5440 Blindness, one eye, unspecified eye: Secondary | ICD-10-CM | POA: Diagnosis not present

## 2013-06-24 DIAGNOSIS — F411 Generalized anxiety disorder: Secondary | ICD-10-CM | POA: Diagnosis not present

## 2013-06-24 DIAGNOSIS — C50919 Malignant neoplasm of unspecified site of unspecified female breast: Secondary | ICD-10-CM | POA: Diagnosis not present

## 2013-06-24 DIAGNOSIS — Z510 Encounter for antineoplastic radiation therapy: Secondary | ICD-10-CM | POA: Diagnosis not present

## 2013-06-24 DIAGNOSIS — M129 Arthropathy, unspecified: Secondary | ICD-10-CM | POA: Diagnosis not present

## 2013-06-25 DIAGNOSIS — Z510 Encounter for antineoplastic radiation therapy: Secondary | ICD-10-CM | POA: Diagnosis not present

## 2013-06-25 DIAGNOSIS — C50919 Malignant neoplasm of unspecified site of unspecified female breast: Secondary | ICD-10-CM | POA: Diagnosis not present

## 2013-06-28 DIAGNOSIS — Z510 Encounter for antineoplastic radiation therapy: Secondary | ICD-10-CM | POA: Diagnosis not present

## 2013-06-28 DIAGNOSIS — C50919 Malignant neoplasm of unspecified site of unspecified female breast: Secondary | ICD-10-CM | POA: Diagnosis not present

## 2013-06-29 DIAGNOSIS — C50919 Malignant neoplasm of unspecified site of unspecified female breast: Secondary | ICD-10-CM | POA: Diagnosis not present

## 2013-06-29 DIAGNOSIS — Z510 Encounter for antineoplastic radiation therapy: Secondary | ICD-10-CM | POA: Diagnosis not present

## 2013-06-30 ENCOUNTER — Other Ambulatory Visit: Payer: Self-pay

## 2013-06-30 DIAGNOSIS — C50919 Malignant neoplasm of unspecified site of unspecified female breast: Secondary | ICD-10-CM | POA: Diagnosis not present

## 2013-06-30 DIAGNOSIS — Z510 Encounter for antineoplastic radiation therapy: Secondary | ICD-10-CM | POA: Diagnosis not present

## 2013-07-01 DIAGNOSIS — Z510 Encounter for antineoplastic radiation therapy: Secondary | ICD-10-CM | POA: Diagnosis not present

## 2013-07-01 DIAGNOSIS — C50919 Malignant neoplasm of unspecified site of unspecified female breast: Secondary | ICD-10-CM | POA: Diagnosis not present

## 2013-07-02 DIAGNOSIS — Z510 Encounter for antineoplastic radiation therapy: Secondary | ICD-10-CM | POA: Diagnosis not present

## 2013-07-02 DIAGNOSIS — C50919 Malignant neoplasm of unspecified site of unspecified female breast: Secondary | ICD-10-CM | POA: Diagnosis not present

## 2013-07-05 DIAGNOSIS — Z510 Encounter for antineoplastic radiation therapy: Secondary | ICD-10-CM | POA: Diagnosis not present

## 2013-07-05 DIAGNOSIS — C50919 Malignant neoplasm of unspecified site of unspecified female breast: Secondary | ICD-10-CM | POA: Diagnosis not present

## 2013-07-06 DIAGNOSIS — Z510 Encounter for antineoplastic radiation therapy: Secondary | ICD-10-CM | POA: Diagnosis not present

## 2013-07-06 DIAGNOSIS — C50919 Malignant neoplasm of unspecified site of unspecified female breast: Secondary | ICD-10-CM | POA: Diagnosis not present

## 2013-07-07 DIAGNOSIS — Z510 Encounter for antineoplastic radiation therapy: Secondary | ICD-10-CM | POA: Diagnosis not present

## 2013-07-07 DIAGNOSIS — C50919 Malignant neoplasm of unspecified site of unspecified female breast: Secondary | ICD-10-CM | POA: Diagnosis not present

## 2013-07-08 DIAGNOSIS — Z510 Encounter for antineoplastic radiation therapy: Secondary | ICD-10-CM | POA: Diagnosis not present

## 2013-07-08 DIAGNOSIS — C50919 Malignant neoplasm of unspecified site of unspecified female breast: Secondary | ICD-10-CM | POA: Diagnosis not present

## 2013-07-09 DIAGNOSIS — Z510 Encounter for antineoplastic radiation therapy: Secondary | ICD-10-CM | POA: Diagnosis not present

## 2013-07-09 DIAGNOSIS — C50919 Malignant neoplasm of unspecified site of unspecified female breast: Secondary | ICD-10-CM | POA: Diagnosis not present

## 2013-07-12 DIAGNOSIS — Z510 Encounter for antineoplastic radiation therapy: Secondary | ICD-10-CM | POA: Diagnosis not present

## 2013-07-12 DIAGNOSIS — C50919 Malignant neoplasm of unspecified site of unspecified female breast: Secondary | ICD-10-CM | POA: Diagnosis not present

## 2013-07-13 DIAGNOSIS — Z510 Encounter for antineoplastic radiation therapy: Secondary | ICD-10-CM | POA: Diagnosis not present

## 2013-07-13 DIAGNOSIS — C50919 Malignant neoplasm of unspecified site of unspecified female breast: Secondary | ICD-10-CM | POA: Diagnosis not present

## 2013-07-14 DIAGNOSIS — C50919 Malignant neoplasm of unspecified site of unspecified female breast: Secondary | ICD-10-CM | POA: Diagnosis not present

## 2013-07-14 DIAGNOSIS — Z510 Encounter for antineoplastic radiation therapy: Secondary | ICD-10-CM | POA: Diagnosis not present

## 2013-07-15 DIAGNOSIS — C50919 Malignant neoplasm of unspecified site of unspecified female breast: Secondary | ICD-10-CM | POA: Diagnosis not present

## 2013-07-15 DIAGNOSIS — Z510 Encounter for antineoplastic radiation therapy: Secondary | ICD-10-CM | POA: Diagnosis not present

## 2013-07-16 DIAGNOSIS — Z510 Encounter for antineoplastic radiation therapy: Secondary | ICD-10-CM | POA: Diagnosis not present

## 2013-07-16 DIAGNOSIS — C50919 Malignant neoplasm of unspecified site of unspecified female breast: Secondary | ICD-10-CM | POA: Diagnosis not present

## 2013-07-19 DIAGNOSIS — Z510 Encounter for antineoplastic radiation therapy: Secondary | ICD-10-CM | POA: Diagnosis not present

## 2013-07-19 DIAGNOSIS — C50919 Malignant neoplasm of unspecified site of unspecified female breast: Secondary | ICD-10-CM | POA: Diagnosis not present

## 2013-07-20 DIAGNOSIS — C50919 Malignant neoplasm of unspecified site of unspecified female breast: Secondary | ICD-10-CM | POA: Diagnosis not present

## 2013-07-20 DIAGNOSIS — Z510 Encounter for antineoplastic radiation therapy: Secondary | ICD-10-CM | POA: Diagnosis not present

## 2013-07-21 DIAGNOSIS — Z510 Encounter for antineoplastic radiation therapy: Secondary | ICD-10-CM | POA: Diagnosis not present

## 2013-07-21 DIAGNOSIS — C50919 Malignant neoplasm of unspecified site of unspecified female breast: Secondary | ICD-10-CM | POA: Diagnosis not present

## 2013-07-22 DIAGNOSIS — C50919 Malignant neoplasm of unspecified site of unspecified female breast: Secondary | ICD-10-CM | POA: Diagnosis not present

## 2013-07-22 DIAGNOSIS — Z510 Encounter for antineoplastic radiation therapy: Secondary | ICD-10-CM | POA: Diagnosis not present

## 2013-08-10 DIAGNOSIS — I951 Orthostatic hypotension: Secondary | ICD-10-CM | POA: Diagnosis not present

## 2013-08-10 DIAGNOSIS — Z79899 Other long term (current) drug therapy: Secondary | ICD-10-CM | POA: Diagnosis not present

## 2013-08-10 DIAGNOSIS — R42 Dizziness and giddiness: Secondary | ICD-10-CM | POA: Diagnosis not present

## 2013-08-24 ENCOUNTER — Encounter (INDEPENDENT_AMBULATORY_CARE_PROVIDER_SITE_OTHER): Payer: Self-pay | Admitting: General Surgery

## 2013-08-24 ENCOUNTER — Ambulatory Visit (INDEPENDENT_AMBULATORY_CARE_PROVIDER_SITE_OTHER): Payer: Medicare Other | Admitting: General Surgery

## 2013-08-24 VITALS — BP 138/88 | HR 90 | Temp 97.0°F | Ht 66.5 in | Wt 162.6 lb

## 2013-08-24 DIAGNOSIS — C50919 Malignant neoplasm of unspecified site of unspecified female breast: Secondary | ICD-10-CM

## 2013-08-24 DIAGNOSIS — C50912 Malignant neoplasm of unspecified site of left female breast: Secondary | ICD-10-CM

## 2013-08-24 NOTE — Progress Notes (Signed)
History: Patient returns for more long-term postop followup for her T2 N0 low-grade ER PR positive invasive lobular carcinoma of the left breast. She has completed her radiation therapy. She saw Dr. Cleone Slim and had a thorough evaluation and discussion regarding pros and cons of adjuvant hormonal therapy and hormonal therapy was not recommended. She is now observation only. She tolerated her radiation well. She notices a little thickening at the lumpectomy site and some occasional sharp pains in her breasts as well as some fatigue.  Exam: General: Appears well Breasts: Lobectomy site left breast is well-healed. There is some moderate thickening medially along the incision in the upper left breast. Mild postradiation changes.  Assessment and plan: Doing well with no apparent complications of treatment. All her questions were answered. I will plan to follow her clinically every 6 months and she is given an appointment.

## 2013-08-26 DIAGNOSIS — Z23 Encounter for immunization: Secondary | ICD-10-CM | POA: Diagnosis not present

## 2013-09-03 DIAGNOSIS — C50919 Malignant neoplasm of unspecified site of unspecified female breast: Secondary | ICD-10-CM | POA: Diagnosis not present

## 2013-09-03 DIAGNOSIS — Z923 Personal history of irradiation: Secondary | ICD-10-CM | POA: Diagnosis not present

## 2013-09-27 DIAGNOSIS — R42 Dizziness and giddiness: Secondary | ICD-10-CM | POA: Diagnosis not present

## 2013-09-30 ENCOUNTER — Other Ambulatory Visit: Payer: Self-pay

## 2013-10-11 ENCOUNTER — Encounter: Payer: Self-pay | Admitting: Adult Health

## 2013-10-11 ENCOUNTER — Ambulatory Visit (INDEPENDENT_AMBULATORY_CARE_PROVIDER_SITE_OTHER): Payer: Medicare Other | Admitting: Adult Health

## 2013-10-11 VITALS — BP 118/70 | Ht 66.0 in | Wt 171.0 lb

## 2013-10-11 DIAGNOSIS — N8189 Other female genital prolapse: Secondary | ICD-10-CM

## 2013-10-11 DIAGNOSIS — N8111 Cystocele, midline: Secondary | ICD-10-CM

## 2013-10-11 DIAGNOSIS — Z1212 Encounter for screening for malignant neoplasm of rectum: Secondary | ICD-10-CM | POA: Diagnosis not present

## 2013-10-11 DIAGNOSIS — IMO0002 Reserved for concepts with insufficient information to code with codable children: Secondary | ICD-10-CM | POA: Insufficient documentation

## 2013-10-11 HISTORY — DX: Other female genital prolapse: N81.89

## 2013-10-11 HISTORY — DX: Reserved for concepts with insufficient information to code with codable children: IMO0002

## 2013-10-11 LAB — HEMOCCULT GUIAC POC 1CARD (OFFICE): Fecal Occult Blood, POC: NEGATIVE

## 2013-10-11 NOTE — Patient Instructions (Signed)
Return in 2 days for pessary fitting

## 2013-10-11 NOTE — Progress Notes (Signed)
Subjective:     Patient ID: [name], female   DOB: 1933-01-09, 79 y.o.   MRN: [id number]  HPI Taelyn is a 79 year old white female, widowed in complaining of rust colored vaginal discharge and something is hanging out of vagina at times.She has just finished up radiation for left breast cancer.Has occasional of urine if she waits too long to go pee at night.   Review of Systems See HPI Reviewed past medical,surgical, social and family history. Reviewed medications and allergies.     Objective:   Physical Exam BP 118/70  Ht 5\' 6"  (1.676 m)  Wt 171 lb (77.565 kg)  BMI 27.61 kg/m2   Skin warm and dry.Pelvic: external genitalia is normal in appearance for age, vagina: atrophic with + cystocele and pelvic relation,no discharge noted today cervix:atrophic, uterus: normal size, shape and contour, non tender, no masses felt, adnexa: no masses or tenderness noted.On rectal exam has good tone, no hemorrhoids felt and hemoccult was negative.Dr Emelda Fear in for co exam to see if candidate for pessary.  Assessment:     Cystocele Pelvic relaxation     Plan:     Return in 2 days for pessary fitting Review handout on cystocele

## 2013-10-13 ENCOUNTER — Encounter: Payer: Self-pay | Admitting: Obstetrics and Gynecology

## 2013-10-13 ENCOUNTER — Ambulatory Visit (INDEPENDENT_AMBULATORY_CARE_PROVIDER_SITE_OTHER): Payer: Medicare Other | Admitting: Obstetrics and Gynecology

## 2013-10-13 VITALS — BP 130/80 | Ht 66.0 in | Wt 168.0 lb

## 2013-10-13 DIAGNOSIS — N812 Incomplete uterovaginal prolapse: Secondary | ICD-10-CM

## 2013-10-13 DIAGNOSIS — N8111 Cystocele, midline: Secondary | ICD-10-CM

## 2013-10-13 DIAGNOSIS — N8110 Cystocele, unspecified: Secondary | ICD-10-CM

## 2013-10-13 NOTE — Progress Notes (Signed)
Patient ID: Kara Woods, female   DOB: 09-27-1933, 79 y.o.   MRN: [id number] Pt here for pessary fitting.   [facility] Clinic Visit  Patient name: Kara Woods MRN [id number]  Date of birth: 1933-01-28  CC & HPI:  Kara Woods is a 79 y.o. female presenting today for pessary fitting. Pt has been told she can wear pessary thru weekend trip as a trial.  ROS:  S/p svd of 10 pound infant.  Pertinent History Reviewed:  Medical & Surgical Hx:  Reviewed: Significant for  Medications: Reviewed & Updated - see associated section Social History: Reviewed -  reports that she quit smoking about 23 years ago. Her smoking use included Cigarettes. She smoked 1.00 pack per day. She has never used smokeless tobacco.  Objective Findings:  Vitals: BP 130/80  Ht 5\' 6"  (1.676 m)  Wt 168 lb (76.204 kg)  BMI 27.13 kg/m2  Physical Examination: General appearance - alert, well appearing, and in no distress Fitted with a small gellhorn pessary 1.75 in. Reck after activity shows good support.   Assessment & Plan:   Pessary fitting. Gelhorn 1.75  reck 5 days will likely need a 2.0 r  2.25 in pessary

## 2013-10-13 NOTE — Patient Instructions (Signed)
Return 1 wk, for pessary reevaluation

## 2013-10-18 ENCOUNTER — Encounter: Payer: Medicare Other | Admitting: Obstetrics and Gynecology

## 2013-10-19 ENCOUNTER — Ambulatory Visit (INDEPENDENT_AMBULATORY_CARE_PROVIDER_SITE_OTHER): Payer: Medicare Other | Admitting: Obstetrics and Gynecology

## 2013-10-19 ENCOUNTER — Encounter: Payer: Self-pay | Admitting: Obstetrics and Gynecology

## 2013-10-19 VITALS — BP 140/80 | Ht 66.0 in | Wt 168.0 lb

## 2013-10-19 DIAGNOSIS — N8111 Cystocele, midline: Secondary | ICD-10-CM

## 2013-10-19 DIAGNOSIS — N8110 Cystocele, unspecified: Secondary | ICD-10-CM

## 2013-10-19 NOTE — Patient Instructions (Signed)
May use your hand to reposition cervix and bladder if prolapsed See [place] in 8 wk to reassess and begin pessary trial, or discuss surgery.

## 2013-10-19 NOTE — Progress Notes (Signed)
  Subjective:    Patient ID: Kara Woods, female    DOB: August 18, 1933, 79 y.o.   MRN: [id number]  HPI followup pessary, Pessary small, expelled while on travel Friday. Pt felt uncomfotable with it in. Today there's less protrusion by pt perception. NO SUI noted.    Review of Systems pt had lots of fatigue after radiation tx x 38 tx doses after left mastectomy. Pt wants to wait til Jan for further evaluation and consideration of pessary refitting.     Objective:   Physical Exam Discussion only       Assessment & Plan:   pelvic relaxatio and cystocele. See in 8 wk

## 2013-11-04 ENCOUNTER — Telehealth: Payer: Self-pay | Admitting: *Deleted

## 2013-11-04 NOTE — Telephone Encounter (Signed)
Pt miserable, will come in 12/15 with JVF for bigger pessary fitting

## 2013-11-08 ENCOUNTER — Ambulatory Visit (INDEPENDENT_AMBULATORY_CARE_PROVIDER_SITE_OTHER): Payer: Medicare Other | Admitting: Obstetrics and Gynecology

## 2013-11-08 ENCOUNTER — Encounter: Payer: Self-pay | Admitting: Obstetrics and Gynecology

## 2013-11-08 VITALS — BP 130/58 | Ht 66.5 in | Wt 166.8 lb

## 2013-11-08 DIAGNOSIS — N812 Incomplete uterovaginal prolapse: Secondary | ICD-10-CM | POA: Diagnosis not present

## 2013-11-08 NOTE — Patient Instructions (Signed)
Wear pessary x 48 hours. You must return for recheck on wednesday

## 2013-11-08 NOTE — Progress Notes (Signed)
   [facility] Clinic Visit  Patient name: Kara Woods MRN [id number]  Date of birth: August 22, 1933  CC & HPI:  Kara Woods is a 79 y.o. female presenting today for followup of pelvic relaxation. Pt is planning to go to urologist, Iin Alliance, I informed pt I have familiarity with Dr McDairmid and have worke with him in past  ROS:  Pt denies SUI  Pertinent History Reviewed:  Medical & Surgical Hx:  Reviewed: Significant for  Medications: Reviewed & Updated - see associated section Social History: Reviewed -  reports that she quit smoking about 23 years ago. Her smoking use included Cigarettes. She smoked 1.00 pack per day. She has never used smokeless tobacco.  Objective Findings:  Vitals: BP 130/58  Ht 5' 6.5" (1.689 m)  Wt 75.66 kg (166 lb 12.8 oz)  BMI 26.52 kg/m2  Physical Examination: General appearance - alert, well appearing, and in no distress and oriented to person, place, and time Mental status - alert, oriented to person, place, and time, normal mood, behavior, speech, dress, motor activity, and thought processes, depressed mood Neck - supple, no significant adenopathy Chest - clear to auscultation, no wheezes, rales or rhonchi, symmetric air entry Abdomen - soft, nontender, nondistended, no masses or organomegaly Physical Examination: Pelvic - normal external genitalia, vulva, vagina, cervix, uterus and adnexa, VULVA: normal appearing vulva with no masses, tenderness or lesions, VAGINA: normal appearing vagina with normal color and discharge, no lesions, atrophic, PELVIC FLOOR EXAM: rectocele present, large broad,, cystocele present , uterine descensus grade 2, CERVIX: normal appearing cervix without discharge or lesions, mobile , UTERUS: uterus is normal size, shape, consistency and nontender, mobile, ADNEXA: normal adnexa in size, nontender and no masses   Assessment & Plan:   Cystocele, rectocele, 2nd degree ut descensus   Plan  : fitted with Ring  pessary, size undetermined. Pt misunderstood pessary trial , and dressed, and prepared to go home with pessary in place.  Will have pt come back in 48 hours after prolonged trial, to see pt satisfaction, and to determine correct pessary size and order it.

## 2013-11-10 ENCOUNTER — Ambulatory Visit (INDEPENDENT_AMBULATORY_CARE_PROVIDER_SITE_OTHER): Payer: Medicare Other | Admitting: Obstetrics and Gynecology

## 2013-11-10 ENCOUNTER — Ambulatory Visit: Payer: Medicare Other | Admitting: Obstetrics and Gynecology

## 2013-11-10 ENCOUNTER — Encounter: Payer: Self-pay | Admitting: Obstetrics and Gynecology

## 2013-11-10 VITALS — BP 120/80 | Ht 66.0 in | Wt 166.0 lb

## 2013-11-10 DIAGNOSIS — Z4689 Encounter for fitting and adjustment of other specified devices: Secondary | ICD-10-CM

## 2013-11-12 ENCOUNTER — Ambulatory Visit (INDEPENDENT_AMBULATORY_CARE_PROVIDER_SITE_OTHER): Payer: Medicare Other | Admitting: Obstetrics and Gynecology

## 2013-11-12 ENCOUNTER — Encounter: Payer: Self-pay | Admitting: Obstetrics and Gynecology

## 2013-11-12 VITALS — BP 138/72 | Ht 66.0 in | Wt 166.0 lb

## 2013-11-12 DIAGNOSIS — N812 Incomplete uterovaginal prolapse: Secondary | ICD-10-CM

## 2013-11-12 DIAGNOSIS — Z4689 Encounter for fitting and adjustment of other specified devices: Secondary | ICD-10-CM

## 2013-11-12 MED ORDER — ESTRADIOL 0.1 MG/GM VA CREA: 0.5000 | TOPICAL_CREAM | Freq: Every day | VAGINAL | Status: DC

## 2013-11-12 NOTE — Progress Notes (Addendum)
Subjective:     Patient ID: Kara Woods, female   DOB: [birth year], 79 y.o.   MRN: [id number]  HPI pesary ring 2.5 inch with support inserted without difficulty The patient brought her pessary in from [facility] for insertion Pt  Now decides that she will likely proceed to surgery in the spring.   Review of Systems     Objective:   Physical Exam Physical Examination: Pelvic - VULVA: normal appearing vulva with no masses, tenderness or lesions, VAGINA: normal appearing vagina with normal color and discharge, no lesions, PELVIC FLOOR EXAM: no cystocele, rectocele or prolapse noted, cystocele 2, uterine descensus grade 2     Assessment:     uterovag prolapse incomplete with cystocele     Plan:    Pessary inserted. Pt's own 2.5 in ring pessary with support. Plan: estrace VC 2x /wk Consider surgery in spring

## 2013-11-12 NOTE — Progress Notes (Addendum)
Patient ID: Kara Woods, female   DOB: [birth year], 79 y.o.   MRN: [id number] See prior notes.  The patient's prior pessary was too small and the stem protruded. The patient fitted with a 2.5 in ring pessary with support but without stem,(such as Gellhorn), and after fitting, pt insists on trying at home and then deciding. Pt will wear pessary as outpt x 2 days , and if pt likes it, will fit a same 2.5 in pessary for pts longterm use..  Inserted and tested by pt .  Will leave in.  Return 2 days fir swapout with 2.5 in pessary [facility] this date.Marland Kitchen

## 2013-11-15 ENCOUNTER — Ambulatory Visit: Payer: Medicare Other | Admitting: Obstetrics and Gynecology

## 2013-11-15 ENCOUNTER — Telehealth: Payer: Self-pay

## 2013-11-15 MED ORDER — ESTROGENS, CONJUGATED 0.625 MG/GM VA CREA: 0.5000 | TOPICAL_CREAM | VAGINAL | Status: DC

## 2013-11-15 NOTE — Telephone Encounter (Signed)
Pt states estrace cream to expensive is there an alternative?

## 2013-11-15 NOTE — Telephone Encounter (Signed)
Pt aware that rx to be changed to premarin vc for cost concerns of pt

## 2013-11-16 ENCOUNTER — Other Ambulatory Visit: Payer: Self-pay | Admitting: Obstetrics and Gynecology

## 2013-11-16 DIAGNOSIS — N952 Postmenopausal atrophic vaginitis: Secondary | ICD-10-CM

## 2013-11-16 MED ORDER — ESTRADIOL 10 MCG VA TABS: 1.0000 | ORAL_TABLET | Freq: Every day | VAGINAL | Status: DC

## 2013-12-07 ENCOUNTER — Telehealth: Payer: Self-pay | Admitting: Adult Health

## 2013-12-07 ENCOUNTER — Ambulatory Visit (INDEPENDENT_AMBULATORY_CARE_PROVIDER_SITE_OTHER): Payer: Medicare Other | Admitting: Urology

## 2013-12-07 DIAGNOSIS — N813 Complete uterovaginal prolapse: Secondary | ICD-10-CM | POA: Diagnosis not present

## 2013-12-07 DIAGNOSIS — N952 Postmenopausal atrophic vaginitis: Secondary | ICD-10-CM | POA: Diagnosis not present

## 2013-12-07 DIAGNOSIS — N8110 Cystocele, unspecified: Secondary | ICD-10-CM | POA: Diagnosis not present

## 2013-12-07 DIAGNOSIS — R82998 Other abnormal findings in urine: Secondary | ICD-10-CM | POA: Diagnosis not present

## 2013-12-07 NOTE — Telephone Encounter (Signed)
Pt states is going to have Dr. Glo Herring to perform her bladder surgery but would like to get blood work done before. Pt informed that she will have pre-op at the hospital that will include blood work as part of work up for surgery. Pt verbalized understanding.

## 2013-12-13 ENCOUNTER — Encounter: Payer: Self-pay | Admitting: Obstetrics and Gynecology

## 2013-12-13 ENCOUNTER — Ambulatory Visit (INDEPENDENT_AMBULATORY_CARE_PROVIDER_SITE_OTHER): Payer: Medicare Other | Admitting: Obstetrics and Gynecology

## 2013-12-13 VITALS — BP 124/60 | Ht 66.5 in | Wt 164.0 lb

## 2013-12-13 DIAGNOSIS — N898 Other specified noninflammatory disorders of vagina: Secondary | ICD-10-CM

## 2013-12-13 LAB — POCT WET PREP (WET MOUNT)

## 2013-12-13 MED ORDER — METRONIDAZOLE 0.75 % VA GEL: 1.0000 | Freq: Every day | VAGINAL | Status: DC

## 2013-12-13 NOTE — Progress Notes (Signed)
This chart was transcribed for Dr. Mallory Shirk by Ludger Nutting, ED scribe.   Patient ID: Kara Woods, female   DOB: February 06, 1933, 80 y.o.   MRN: [id number]  Chief Complaint  Patient presents with  . Pre-op Exam     HPI  HPI Kara Woods is a 80 y.o. female. She is here to discuss plan for GYN surgery. Pt reports having radiation for breast cancer which was finished on August 22, 2013. She is no longer undergoing radiation. She reports having mild leg swelling and pain after the radiation sessions and this continues currently. She reports having fatigue and decreased energy level to perform normal activities. She has a recent history of bacterial vaginosis and has been compliant with the medication for it.    Past Medical History  Diagnosis Date  . GERD (gastroesophageal reflux disease)   . Proctitis     colonsocopy 2007, Canasa suppositories  . S/P endoscopy March 2009    mild erosive reflux esophagitis, Schatzki's ring, s/p dilation  . Schatzki's ring   . Colitis   . Arthritis   . Shortness of breath     occ if anxiety  . Wears glasses   . Anxiety   . Depression   . Wears dentures     upper  . HOH (hard of hearing)   . Blind left eye   . Cancer     left breast   . Breast disorder     cancer left  . Cystocele 10/11/2013  . Pelvic relaxation 10/11/2013    Past Surgical History  Procedure Laterality Date  . Lumbar disc surgery  1993  . Retinal detachment surgery  1978    Left  . Colonoscopy  05/06/2006    Diffuse inflammatory changes of the rectal mucosa, consistent  with proctitis.  Otherwise, normal colon to terminal ileum  . Esophagogastroduodenoscopy  02/09/2008    Schatzki ring status post dilation/Distal esophageal erosion consistent with mild erosive reflux  esophagitis, otherwise unremarkable esophagus, normal stomach, D1, D2.  . Cardiac catheterization  1998  . Eye surgery      lt cataract-implant  . Eye surgery      lt-lens repaced,l  . Breast  lumpectomy with needle localization and axillary sentinel lymph node bx Left 05/03/2013    Procedure: BREAST LUMPECTOMY WITH NEEDLE LOCALIZATION AND AXILLARY SENTINEL LYMPH NODE BX;  Surgeon: Edward Jolly, MD;  Location: [facility];  Service: General;  Laterality: Left;  . Re-excision of breast lumpectomy Left 05/19/2013    Procedure: RE-EXCISION OF BREAST LUMPECTOMY;  Surgeon: Edward Jolly, MD;  Location: [facility];  Service: General;  Laterality: Left;  . Breast surgery Left 05/19/13    Family History  Problem Relation Age of Onset  . Colon cancer Neg Hx   . Cancer Brother     spinal    Social History History  Substance Use Topics  . Smoking status: Former Smoker -- 1.00 packs/day    Types: Cigarettes    Quit date: 04/28/1990  . Smokeless tobacco: Never Used     Comment: quit 19 yrs ago  . Alcohol Use: Yes     Comment: a glass of wine once or twice a week    Allergies  Allergen Reactions  . Sulfonamide Derivatives Other (See Comments)    unknown    Current Outpatient Prescriptions  Medication Sig Dispense Refill  . acetaminophen (TYLENOL) 325 MG tablet Take 650 mg by mouth every 6 (six) hours  as needed.        Marland Kitchen escitalopram (LEXAPRO) 10 MG tablet Take 10 mg by mouth daily.      Marland Kitchen LOTEMAX 0.5 % ophthalmic suspension Place 1 drop into both eyes 4 (four) times daily.       Marland Kitchen sulfamethoxazole-trimethoprim (BACTRIM DS) 800-160 MG per tablet Take 1 tablet by mouth 2 (two) times daily.      . Tetrahydrozoline HCl (EYE DROPS OP) Apply to eye.      . vitamin C (ASCORBIC ACID) 500 MG tablet Take 500 mg by mouth daily.      . Estradiol 10 MCG TABS vaginal tablet Place 1 tablet (10 mcg total) vaginally daily.  30 tablet  12  . furosemide (LASIX) 20 MG tablet Take 40 mg by mouth as needed.       No current facility-administered medications for this visit.    Review of Systems Review of Systems  Constitutional: Positive for fatigue.   Cardiovascular: Positive for leg swelling.  Musculoskeletal: Positive for myalgias (lower extremity pain).    Blood pressure 124/60, height 5' 6.5" (1.689 m), weight 164 lb (74.39 kg).  Physical Exam Physical Exam  Vitals reviewed. Constitutional: She is oriented to person, place, and time. She appears well-developed and well-nourished. No distress.  HENT:  Head: Normocephalic and atraumatic.  Neck: Normal range of motion.  Cardiovascular: Normal rate.   Pulmonary/Chest: Effort normal. No respiratory distress.  Abdominal: She exhibits no distension.  Genitourinary: Pelvic exam was performed with patient supine. No erythema around the vagina. Vaginal discharge found.  Ring pessary in place. No erythema. Leukorrhea present.    Musculoskeletal: Normal range of motion. She exhibits edema (Trace edema in lower extremities.).  Neurological: She is alert and oriented to person, place, and time.  Skin: Skin is warm and dry. She is not diaphoretic.  Psychiatric: She has a normal mood and affect. Her behavior is normal.     Data Reviewed Wet prep reviewed.   Assessment    Vag discharge from pessary No visible vaginal irritation     Plan    Complete current septra Rx Rx Metrogel per vagina 2x/wk refil x 2 Pt anxious re fatigue, will refer to dr Willey Blade.  Postpone surgery until March upon release by cancer specialist.        Kara Woods 12/13/2013, 10:20 AM

## 2013-12-13 NOTE — Patient Instructions (Signed)
Use the metronidazole twice weekly , 1/2 applicator per vagina twice weekly

## 2013-12-15 ENCOUNTER — Ambulatory Visit: Payer: Medicare Other | Admitting: Obstetrics and Gynecology

## 2013-12-16 DIAGNOSIS — F411 Generalized anxiety disorder: Secondary | ICD-10-CM | POA: Diagnosis not present

## 2013-12-20 DIAGNOSIS — N8111 Cystocele, midline: Secondary | ICD-10-CM | POA: Diagnosis not present

## 2013-12-20 DIAGNOSIS — N3941 Urge incontinence: Secondary | ICD-10-CM | POA: Diagnosis not present

## 2013-12-21 ENCOUNTER — Ambulatory Visit (INDEPENDENT_AMBULATORY_CARE_PROVIDER_SITE_OTHER): Payer: Medicare Other | Admitting: Urology

## 2013-12-21 DIAGNOSIS — N8111 Cystocele, midline: Secondary | ICD-10-CM

## 2013-12-21 DIAGNOSIS — N813 Complete uterovaginal prolapse: Secondary | ICD-10-CM

## 2013-12-30 DIAGNOSIS — F411 Generalized anxiety disorder: Secondary | ICD-10-CM | POA: Diagnosis not present

## 2014-01-03 DIAGNOSIS — N3941 Urge incontinence: Secondary | ICD-10-CM | POA: Diagnosis not present

## 2014-01-13 DIAGNOSIS — N8111 Cystocele, midline: Secondary | ICD-10-CM | POA: Diagnosis not present

## 2014-01-13 DIAGNOSIS — N3941 Urge incontinence: Secondary | ICD-10-CM | POA: Diagnosis not present

## 2014-01-19 DIAGNOSIS — N3941 Urge incontinence: Secondary | ICD-10-CM | POA: Diagnosis not present

## 2014-01-19 DIAGNOSIS — N816 Rectocele: Secondary | ICD-10-CM | POA: Diagnosis not present

## 2014-01-19 DIAGNOSIS — N8111 Cystocele, midline: Secondary | ICD-10-CM | POA: Diagnosis not present

## 2014-02-02 DIAGNOSIS — N8111 Cystocele, midline: Secondary | ICD-10-CM | POA: Diagnosis not present

## 2014-02-02 DIAGNOSIS — N814 Uterovaginal prolapse, unspecified: Secondary | ICD-10-CM | POA: Diagnosis not present

## 2014-02-10 DIAGNOSIS — R350 Frequency of micturition: Secondary | ICD-10-CM | POA: Diagnosis not present

## 2014-02-10 DIAGNOSIS — N814 Uterovaginal prolapse, unspecified: Secondary | ICD-10-CM | POA: Diagnosis not present

## 2014-02-18 ENCOUNTER — Ambulatory Visit (INDEPENDENT_AMBULATORY_CARE_PROVIDER_SITE_OTHER): Payer: Medicare Other | Admitting: General Surgery

## 2014-02-18 ENCOUNTER — Encounter (INDEPENDENT_AMBULATORY_CARE_PROVIDER_SITE_OTHER): Payer: Self-pay | Admitting: General Surgery

## 2014-02-18 VITALS — BP 118/82 | HR 66 | Temp 97.6°F | Resp 16 | Wt 166.4 lb

## 2014-02-18 DIAGNOSIS — C50919 Malignant neoplasm of unspecified site of unspecified female breast: Secondary | ICD-10-CM | POA: Diagnosis not present

## 2014-02-18 DIAGNOSIS — C50912 Malignant neoplasm of unspecified site of left female breast: Secondary | ICD-10-CM

## 2014-02-18 NOTE — Progress Notes (Signed)
History: Patient returns for more long-term postop followup for her T2 N0 low-grade ER PR positive invasive lobular carcinoma of the left breast, diagnosis June 2014.Marland Kitchen She had postoperative radiation therapy. Hormonal therapy was not recommended. She is now observation only. She tolerated her radiation well. She notices a little thickening at the lumpectomy site and some occasional sharp pains in her breasts and left axilla but no mass or skin changes or nipple discharge.  Exam: General: Appears well Breasts: Lumpectomy site left breast is well-healed. There is some moderate thickening medially along the incision in the upper left breast. Some depression deformity. Mild postradiation changes. No palpable masses. Lymph nodes no cervical, supraclavicular or axillary nodes palpable  Assessment and plan: Doing well with no apparent early recurrence.. All her questions were answered. Mammogram due in June. Return in 6 months.

## 2014-02-21 DIAGNOSIS — N814 Uterovaginal prolapse, unspecified: Secondary | ICD-10-CM | POA: Diagnosis not present

## 2014-03-01 DIAGNOSIS — N814 Uterovaginal prolapse, unspecified: Secondary | ICD-10-CM | POA: Diagnosis not present

## 2014-03-01 DIAGNOSIS — R32 Unspecified urinary incontinence: Secondary | ICD-10-CM | POA: Diagnosis not present

## 2014-03-01 DIAGNOSIS — N760 Acute vaginitis: Secondary | ICD-10-CM | POA: Diagnosis not present

## 2014-03-10 ENCOUNTER — Other Ambulatory Visit: Payer: Self-pay | Admitting: Urology

## 2014-03-11 DIAGNOSIS — N8110 Cystocele, unspecified: Secondary | ICD-10-CM | POA: Diagnosis not present

## 2014-03-11 DIAGNOSIS — N8111 Cystocele, midline: Secondary | ICD-10-CM | POA: Diagnosis not present

## 2014-03-11 DIAGNOSIS — N390 Urinary tract infection, site not specified: Secondary | ICD-10-CM | POA: Diagnosis not present

## 2014-03-11 DIAGNOSIS — N816 Rectocele: Secondary | ICD-10-CM | POA: Diagnosis not present

## 2014-03-16 ENCOUNTER — Other Ambulatory Visit: Payer: Self-pay | Admitting: Urology

## 2014-03-28 ENCOUNTER — Telehealth: Payer: Self-pay | Admitting: Adult Health

## 2014-03-28 NOTE — Telephone Encounter (Signed)
Pt states that she needs her pessary put back in. Pt switched to front office and pt was given an appointment for reinsertion of her pessary.

## 2014-03-30 ENCOUNTER — Ambulatory Visit: Payer: Medicare Other | Admitting: Obstetrics and Gynecology

## 2014-04-07 ENCOUNTER — Ambulatory Visit: Payer: Medicare Other | Admitting: Obstetrics and Gynecology

## 2014-04-14 ENCOUNTER — Ambulatory Visit (INDEPENDENT_AMBULATORY_CARE_PROVIDER_SITE_OTHER): Payer: Medicare Other | Admitting: Adult Health

## 2014-04-14 ENCOUNTER — Encounter: Payer: Self-pay | Admitting: Adult Health

## 2014-04-14 VITALS — BP 140/80 | Ht 66.5 in | Wt 166.0 lb

## 2014-04-14 DIAGNOSIS — N898 Other specified noninflammatory disorders of vagina: Secondary | ICD-10-CM | POA: Diagnosis not present

## 2014-04-14 DIAGNOSIS — R319 Hematuria, unspecified: Secondary | ICD-10-CM

## 2014-04-14 DIAGNOSIS — Z4689 Encounter for fitting and adjustment of other specified devices: Secondary | ICD-10-CM | POA: Diagnosis not present

## 2014-04-14 LAB — POCT URINALYSIS DIPSTICK
Glucose, UA: NEGATIVE
Ketones, UA: NEGATIVE
Nitrite, UA: NEGATIVE

## 2014-04-14 LAB — POCT WET PREP (WET MOUNT): WBC, Wet Prep HPF POC: POSITIVE

## 2014-04-14 MED ORDER — AMPICILLIN 500 MG PO CAPS: 500.0000 mg | ORAL_CAPSULE | Freq: Four times a day (QID) | ORAL | Status: DC

## 2014-04-14 NOTE — Patient Instructions (Signed)
Hematuria, Adult Hematuria is blood in your urine. It can be caused by a bladder infection, kidney infection, prostate infection, kidney stone, or cancer of your urinary tract. Infections can usually be treated with medicine, and a kidney stone usually will pass through your urine. If neither of these is the cause of your hematuria, further workup to find out the reason may be needed. It is very important that you tell your health care provider about any blood you see in your urine, even if the blood stops without treatment or happens without causing pain. Blood in your urine that happens and then stops and then happens again can be a symptom of a very serious condition. Also, pain is not a symptom in the initial stages of many urinary cancers. HOME CARE INSTRUCTIONS   Drink lots of fluid, 3 4 quarts a day. If you have been diagnosed with an infection, cranberry juice is especially recommended, in addition to large amounts of water. Avoid caffeine, tea, and carbonated beverages, because they tend to irrUrinary Tract Infection Urinary tract infections (UTIs) can develop anywhere along your urinary tract. Your urinary tract is your body's drainage system for removing wastes and extra water. Your urinary tract includes two kidneys, two ureters, a bladder, and a urethra. Your kidneys are a pair of bean-shaped organs. Each kidney is about the size of your fist. They are located below your ribs, one on each side of your spine. CAUSES Infections are caused by microbes, which are microscopic organisms, including fungi, viruses, and bacteria. These organisms are so small that they can only be seen through a microscope. Bacteria are the microbes that most commonly cause UTIs. SYMPTOMS  Symptoms of UTIs may vary by age and gender of the patient and by the location of the infection. Symptoms in young women typically include a frequent and intense urge to urinate and a painful, burning feeling in the bladder or  urethra during urination. Older women and men are more likely to be tired, shaky, and weak and have muscle aches and abdominal pain. A fever may mean the infection is in your kidneys. Other symptoms of a kidney infection include pain in your back or sides below the ribs, nausea, and vomiting. DIAGNOSIS To diagnose a UTI, your caregiver will ask you about your symptoms. Your caregiver also will ask to provide a urine sample. The urine sample will be tested for bacteria and white blood cells. White blood cells are made by your body to help fight infection. TREATMENT  Typically, UTIs can be treated with medication. Because most UTIs are caused by a bacterial infection, they usually can be treated with the use of antibiotics. The choice of antibiotic and length of treatment depend on your symptoms and the type of bacteria causing your infection. HOME CARE INSTRUCTIONS If you were prescribed antibiotics, take them exactly as your caregiver instructs you. Finish the medication even if you feel better after you have only taken some of the medication. Drink enough water and fluids to keep your urine clear or pale yellow. Avoid caffeine, tea, and carbonated beverages. They tend to irritate your bladder. Empty your bladder often. Avoid holding urine for long periods of time. Empty your bladder before and after sexual intercourse. After a bowel movement, women should cleanse from front to back. Use each tissue only once. SEEK MEDICAL CARE IF:  You have back pain. You develop a fever. Your symptoms do not begin to resolve within 3 days. SEEK IMMEDIATE MEDICAL CARE IF:  You  have severe back pain or lower abdominal pain. You develop chills. You have nausea or vomiting. You have continued burning or discomfort with urination. MAKE SURE YOU:  Understand these instructions. Will watch your condition. Will get help right away if you are not doing well or get worse. Document Released: 08/21/2005 Document  Revised: 05/12/2012 Document Reviewed: 12/20/2011 [facility] Patient Information 2014 [place], [place].  itate the bladder.  Avoid alcohol because it may irritate the prostate.  Only take over-the-counter or prescription medicines for pain, discomfort, or fever as directed by your health care provider.  If you have been diagnosed with a kidney stone, follow your health care provider's instructions regarding straining your urine to catch the stone.  Empty your bladder often. Avoid holding urine for long periods of time.  After a bowel movement, women should cleanse front to back. Use each tissue only once.  Empty your bladder before and after sexual intercourse if you are a female. SEEK MEDICAL CARE IF: You develop back pain, fever, a feeling of sickness in your stomach (nausea), or vomiting or if your symptoms are not better in 3 days. Return sooner if you are getting worse. SEEK IMMEDIATE MEDICAL CARE IF:   You have a persistent fever, with a temperature of 101.89F (38.8C) or greater.  You develop severe vomiting and are unable to keep the medicine down.  You develop severe back or abdominal pain despite taking your medicines.  You begin passing a large amount of blood or clots in your urine.  You feel extremely weak or faint, or you pass out. MAKE SURE YOU:   Understand these instructions.  Will watch your condition.  Will get help right away if you are not doing well or get worse. Document Released: 11/11/2005 Document Revised: 09/01/2013 Document Reviewed: 07/12/2013 [facility] Patient Information 2014 [place]. Follow up with urine after antibiotics finished Push water

## 2014-04-14 NOTE — Progress Notes (Signed)
Subjective:     Patient ID: Kara Woods, female   DOB: 11-26-1932, 80 y.o.   MRN: [id number]  HPI Kara Woods is a 80 year old white female in complaining of vaginal odor, sweats at night and some abdominal pain, has pessary.She says feels like UTI to her.  Review of Systems See HPI Reviewed past medical,surgical, social and family history. Reviewed medications and allergies.     Objective:   Physical Exam BP 140/80  Ht 5' 6.5" (1.689 m)  Wt 166 lb (75.297 kg)  BMI 26.39 kg/m2urine trace blood,trace protein,2+leuks.   Skin warm and dry.Pelvic: external genitalia is normal in appearance for age, vagina: creamy discharge without odor, no lesions, cervix:smooth and bulbous, uterus: normal size, shape and contour, non tender, no masses felt, adnexa: no masses or tenderness noted.Has pelvic relaxation and cystocele. Wet prep:  +WBCs. Pessary removed and cleaned and reinserted.  Assessment:     Hematuria Pessary maintenance Vaginal discharge    Plan:    UA C&S sent Rx ampicillin 500 mg 1 qid x 7 days #28 no refills Push fluids Follow up prn   Continue to use metrogel Review handout on UTI

## 2014-04-15 LAB — URINALYSIS
Bilirubin Urine: NEGATIVE
Glucose, UA: NEGATIVE mg/dL
Ketones, ur: NEGATIVE mg/dL
Nitrite: NEGATIVE
Protein, ur: NEGATIVE mg/dL
Specific Gravity, Urine: 1.015 (ref 1.005–1.030)
Urobilinogen, UA: 0.2 mg/dL (ref 0.0–1.0)
pH: 7.5 (ref 5.0–8.0)

## 2014-04-17 LAB — URINE CULTURE: Colony Count: >=100000

## 2014-04-19 ENCOUNTER — Telehealth: Payer: Self-pay | Admitting: Adult Health

## 2014-04-19 NOTE — Telephone Encounter (Signed)
Left message that urine was +Ecoli and that medication should cover it

## 2014-04-22 DIAGNOSIS — F411 Generalized anxiety disorder: Secondary | ICD-10-CM | POA: Diagnosis not present

## 2014-04-25 ENCOUNTER — Telehealth: Payer: Self-pay | Admitting: Adult Health

## 2014-04-25 NOTE — Telephone Encounter (Signed)
Ok to drop off urine end of this week

## 2014-04-25 NOTE — Telephone Encounter (Signed)
Pt states Derrek Monaco, NP treated pt for +Ecoli urine culture, she has taken all the antibiotics prescribed. When does she need to come in for repeat urine for POC?

## 2014-04-26 ENCOUNTER — Other Ambulatory Visit: Payer: Medicare Other

## 2014-04-26 DIAGNOSIS — N390 Urinary tract infection, site not specified: Secondary | ICD-10-CM | POA: Diagnosis not present

## 2014-04-27 LAB — URINALYSIS
Bilirubin Urine: NEGATIVE
Glucose, UA: NEGATIVE mg/dL
Ketones, ur: NEGATIVE mg/dL
Nitrite: NEGATIVE
Protein, ur: NEGATIVE mg/dL
Specific Gravity, Urine: 1.009 (ref 1.005–1.030)
Urobilinogen, UA: 0.2 mg/dL (ref 0.0–1.0)
pH: 7 (ref 5.0–8.0)

## 2014-04-28 ENCOUNTER — Telehealth: Payer: Self-pay

## 2014-04-28 LAB — URINE CULTURE: Colony Count: 30000

## 2014-04-28 NOTE — Telephone Encounter (Signed)
Pt informed urine culture still pending.

## 2014-04-28 NOTE — Telephone Encounter (Signed)
Pt aware of culture will recheck next week, in sterile cup

## 2014-05-03 ENCOUNTER — Other Ambulatory Visit: Payer: Self-pay | Admitting: Obstetrics and Gynecology

## 2014-05-05 ENCOUNTER — Encounter (HOSPITAL_COMMUNITY): Payer: Self-pay | Admitting: Pharmacist

## 2014-05-05 ENCOUNTER — Other Ambulatory Visit: Payer: Medicare Other

## 2014-05-05 DIAGNOSIS — N390 Urinary tract infection, site not specified: Secondary | ICD-10-CM

## 2014-05-05 LAB — URINALYSIS, ROUTINE W REFLEX MICROSCOPIC
Bilirubin Urine: NEGATIVE
Glucose, UA: NEGATIVE mg/dL
Hgb urine dipstick: NEGATIVE
Ketones, ur: NEGATIVE mg/dL
Nitrite: NEGATIVE
Protein, ur: NEGATIVE mg/dL
Specific Gravity, Urine: 1.016 (ref 1.005–1.030)
Urobilinogen, UA: 0.2 mg/dL (ref 0.0–1.0)
pH: 6.5 (ref 5.0–8.0)

## 2014-05-06 LAB — URINALYSIS, MICROSCOPIC ONLY
Bacteria, UA: NONE SEEN
Casts: NONE SEEN
Crystals: NONE SEEN

## 2014-05-07 LAB — URINE CULTURE
Colony Count: NO GROWTH
Organism ID, Bacteria: NO GROWTH

## 2014-05-10 ENCOUNTER — Encounter (HOSPITAL_COMMUNITY): Payer: Self-pay

## 2014-05-10 ENCOUNTER — Telehealth: Payer: Self-pay | Admitting: Adult Health

## 2014-05-10 ENCOUNTER — Encounter (HOSPITAL_COMMUNITY)
Admission: RE | Admit: 2014-05-10 | Discharge: 2014-05-10 | Disposition: A | Discharge status: Home / Self Care | Payer: Medicare Other | Source: Ambulatory Visit | Attending: Obstetrics and Gynecology | Admitting: Obstetrics and Gynecology

## 2014-05-10 DIAGNOSIS — Z01818 Encounter for other preprocedural examination: Secondary | ICD-10-CM | POA: Insufficient documentation

## 2014-05-10 DIAGNOSIS — Z01812 Encounter for preprocedural laboratory examination: Secondary | ICD-10-CM | POA: Insufficient documentation

## 2014-05-10 DIAGNOSIS — N390 Urinary tract infection, site not specified: Secondary | ICD-10-CM | POA: Diagnosis not present

## 2014-05-10 LAB — CBC
HCT: 42.1 % (ref 36.0–46.0)
Hemoglobin: 14 g/dL (ref 12.0–15.0)
MCH: 31 pg (ref 26.0–34.0)
MCHC: 33.3 g/dL (ref 30.0–36.0)
MCV: 93.1 fL (ref 78.0–100.0)
Platelets: 228 10*3/uL (ref 150–400)
RBC: 4.52 MIL/uL (ref 3.87–5.11)
RDW: 13.4 % (ref 11.5–15.5)
WBC: 8.1 10*3/uL (ref 4.0–10.5)

## 2014-05-10 LAB — BASIC METABOLIC PANEL
BUN: 10 mg/dL (ref 6–23)
CO2: 28 mEq/L (ref 19–32)
Calcium: 10.2 mg/dL (ref 8.4–10.5)
Chloride: 102 mEq/L (ref 96–112)
Creatinine, Ser: 0.74 mg/dL (ref 0.50–1.10)
GFR calc Af Amer: >90 mL/min (ref >90)
GFR calc non Af Amer: 79 mL/min — ABNORMAL LOW (ref >90)
Glucose, Bld: 92 mg/dL (ref 70–99)
Potassium: 4.9 mEq/L (ref 3.7–5.3)
Sodium: 140 mEq/L (ref 137–147)

## 2014-05-10 LAB — APTT: aPTT: 30 seconds (ref 24–37)

## 2014-05-10 LAB — PROTIME-INR
INR: 0.93 (ref 0.00–1.49)
Prothrombin Time: 12.3 seconds (ref 11.6–15.2)

## 2014-05-10 NOTE — Patient Instructions (Signed)
Gilbert  05/10/2014   Your procedure is scheduled on:  05/17/14  Enter through the Main Entrance of [facility] at [place] up the phone at the desk and dial [phone].   Call this number if you have problems the morning of surgery: [phone]   Remember:   Do not eat food:After Midnight.  Do not drink clear liquids: After Midnight.  Take these medicines the morning of surgery with A SIP OF WATER: NA   Do not wear jewelry, make-up or nail polish.  Do not wear lotions, powders, or perfumes. You may wear deodorant.  Do not shave 48 hours prior to surgery.  Do not bring valuables to the hospital.  [facility] is not   responsible for any belongings or valuables brought to the hospital.  Contacts, dentures or bridgework may not be worn into surgery.  Leave suitcase in the car. After surgery it may be brought to your room.  For patients admitted to the hospital, checkout time is 11:00 AM the day of              discharge.   Patients discharged the day of surgery will not be allowed to drive             home.  Name and phone number of your driver: NA  Special Instructions:      Please read over the following fact sheets that you were given:   Surgical Site Infection Prevention

## 2014-05-10 NOTE — Telephone Encounter (Signed)
Pt aware urine showed no growth

## 2014-05-13 DIAGNOSIS — N814 Uterovaginal prolapse, unspecified: Secondary | ICD-10-CM | POA: Diagnosis not present

## 2014-05-16 MED ORDER — PHENAZOPYRIDINE HCL 200 MG PO TABS
200.0000 mg | ORAL_TABLET | Freq: Once | ORAL | Status: AC
  Administered 2014-05-17: 200 mg via ORAL
  Filled 2014-05-16: qty 1

## 2014-05-16 MED ORDER — DEXTROSE 5 % IV SOLN
5.0000 mg/kg | Freq: Once | INTRAVENOUS | Status: AC
  Administered 2014-05-17: 330 mg via INTRAVENOUS
  Filled 2014-05-16: qty 8.25

## 2014-05-16 NOTE — H&P (Signed)
History of Present Illness   [name] has pelvic organ prolapse as noted. She has a little bit of urge incontinence but denies stress incontinence. She has failed pessary trials, as noted. When I examined her I removed a well-fitting high diaphragm pessary. She had a grade 3 cystocele that just reached the introitus and the cervix descended from 8 or 9 cm to 5 cm. With the prolapse reduced, she had diffuse mild grade 2 rectocele. She had no stress incontinence associated with hypermobility. Her residual was 106 mL. I thought if she ever had surgery, she likely best benefit from a transvaginal hysterectomy, cystocele repair and graft and vault suspension, and an intraoperative decision whether or not she would benefit from a rectocele repair. She is here to discuss urodynamics.  Review of Systems: No change in bowel or neurologic systems.   I read a note regarding having to redo the study since the computer shut down.   [name] did not void and was catheterized for 175 mL. Her maximum capacity was 375 mL. Her bladder was unstable reaching pressures of 5 cmH2O. She did not leak. She did not leak with a Valsalva pressure of 99 cmH2O. There was no stress incontinence with or without the packing in place. During voluntary voiding, she voided 350 mL with a maximum flow of 23 mL/sec.   Maximum voiding pressure was 17 cmH2O. Residual was 20-30 mL. EMG activity was normal. Bladder neck descended 3 cm. She had a moderate cystocele as noted fluoroscopically. The details of the urodynamics are signed and dictated on the urodynamic sheet.    Past Medical History Problems  1. History of Anxiety (300.00) 2. History of arthritis (V13.4) 3. History of breast cancer (V10.3) 4. History of esophageal reflux (V12.79)  Surgical History Problems  1. History of Back Surgery 2. History of Eye Surgery  Current Meds 1. Lexapro 10 MG Oral Tablet;  Therapy: (Recorded:27Jan2015) to Recorded 2. Tylenol 500 MG  CAPS;  Therapy: (Recorded:13Jan2015) to Recorded 3. Vitamin B Complex CAPS;  Therapy: (Recorded:13Jan2015) to Recorded 4. Vitamin C TABS;  Therapy: (Recorded:13Jan2015) to Recorded  Allergies Medication  1. No Known Drug Allergies Denied  2. Sulfamethoxazole-TMP DS TABS  Family History Problems  1. Family history of congestive heart failure (V17.49) : Mother, Father  Social History Problems  1. Denied: History of Alcohol use 2. Caffeine use (V49.89)   four 3. Former smoker (V15.82) 4. Retired 80. Tobacco use (305.1)   smoked 3 packs for 25 years quit 18 years ago 6. Widowed  Assessment Assessed  1. Cystocele, midline (618.01) 2. Rectocele, female (618.04) 3. Urge incontinence of urine (788.31)  Plan Cystocele, midline  1. Gynecology Referral Referral  Referral  Status: Hold For - Appointment,Records   Requested for: 25Feb2015  Discussion/Summary   I drew [name] a picture. She understands why she has urge incontinence and frequency.   I talked to her about a transvaginal hysterectomy with vault suspension, cystocele repair and graft. An intraoperative decision whether to do a rectocele repair would be made during surgery. Pessary and watchful waiting discussed.  I drew her a picture and we talked about prolapse surgery in detail. Pros, cons, general surgical and anesthetic risks, and other options including behavioral therapy, pessaries, and watchful waiting were discussed. She understands that prolapse repairs are successful in 80-85% of cases for prolapse symptoms and can recur anteriorly, posteriorly, and/or apically. She understands that in most cases I use a graft and general risks were discussed. Surgical risks  were described but not limited to the discussion of injury to neighboring structures including the bowel (with possible life-threatening sepsis and colostomy), bladder, urethra, vagina (all resulting in further surgery), and ureter (resulting in  re-implantation). We talked about injury to nerves/soft tissue leading to debilitating and intractable pelvic, abdominal, and lower extremity pain syndromes and neuropathies. The risks of buttock pain, intractable dyspareunia, and vaginal narrowing and shortening with sequelae were discussed. Bleeding risks, transfusion rates, and infection were discussed. The risk of persistent, de novo, or worsening bladder and/or bowel incontinence/dysfunction was discussed. The need for CIC was described as well the usual post-operative course. The patient understands that she might not reach her treatment goal and that she might be worse following surgery.  In my opinion, she does not need a sling. De novo worsening incontinence was also discussed.   Kara Woods is a little bit reluctant to proceed with surgery based upon risks, etc. I certainly can empathize with her decision. She is going to follow up with Dr. Garwin Brothers to discuss a hysterectomy, but I think she may wish to try another pessary as well. She thinks her current pessary is too small. I am going to send a copy of my note to Dr. Servando Salina to keep her updated on her treatment course.  After a thorough review of the management options for the patient's condition the patient  elected to proceed with surgical therapy as noted above. We have discussed the potential benefits and risks of the procedure, side effects of the proposed treatment, the likelihood of the patient achieving the goals of the procedure, and any potential problems that might occur during the procedure or recuperation. Informed consent has been obtained.

## 2014-05-17 ENCOUNTER — Encounter (HOSPITAL_COMMUNITY): Payer: Self-pay | Admitting: *Deleted

## 2014-05-17 ENCOUNTER — Inpatient Hospital Stay (HOSPITAL_COMMUNITY)
Admission: RE | Admit: 2014-05-17 | Discharge: 2014-05-18 | DRG: 742 | Disposition: A | Discharge status: Home / Self Care | Payer: Medicare Other | Source: Ambulatory Visit | Attending: Obstetrics and Gynecology | Admitting: Obstetrics and Gynecology

## 2014-05-17 ENCOUNTER — Encounter (HOSPITAL_COMMUNITY): Payer: Medicare Other | Admitting: Anesthesiology

## 2014-05-17 ENCOUNTER — Encounter (HOSPITAL_COMMUNITY)
Admission: RE | Disposition: A | Discharge status: Home / Self Care | Payer: Self-pay | Source: Ambulatory Visit | Attending: Obstetrics and Gynecology

## 2014-05-17 ENCOUNTER — Ambulatory Visit (HOSPITAL_COMMUNITY): Payer: Medicare Other | Admitting: Anesthesiology

## 2014-05-17 DIAGNOSIS — Z90710 Acquired absence of both cervix and uterus: Secondary | ICD-10-CM | POA: Diagnosis present

## 2014-05-17 DIAGNOSIS — Y9210 Unspecified residential institution as the place of occurrence of the external cause: Secondary | ICD-10-CM | POA: Diagnosis not present

## 2014-05-17 DIAGNOSIS — N8111 Cystocele, midline: Secondary | ICD-10-CM | POA: Diagnosis not present

## 2014-05-17 DIAGNOSIS — S3730XA Unspecified injury of urethra, initial encounter: Secondary | ICD-10-CM | POA: Diagnosis not present

## 2014-05-17 DIAGNOSIS — K219 Gastro-esophageal reflux disease without esophagitis: Secondary | ICD-10-CM | POA: Diagnosis present

## 2014-05-17 DIAGNOSIS — N812 Incomplete uterovaginal prolapse: Principal | ICD-10-CM | POA: Diagnosis present

## 2014-05-17 DIAGNOSIS — Z87891 Personal history of nicotine dependence: Secondary | ICD-10-CM

## 2014-05-17 DIAGNOSIS — IMO0002 Reserved for concepts with insufficient information to code with codable children: Secondary | ICD-10-CM | POA: Diagnosis not present

## 2014-05-17 DIAGNOSIS — N814 Uterovaginal prolapse, unspecified: Secondary | ICD-10-CM | POA: Diagnosis not present

## 2014-05-17 DIAGNOSIS — S3720XA Unspecified injury of bladder, initial encounter: Secondary | ICD-10-CM | POA: Diagnosis not present

## 2014-05-17 DIAGNOSIS — N3941 Urge incontinence: Secondary | ICD-10-CM | POA: Diagnosis not present

## 2014-05-17 DIAGNOSIS — F341 Dysthymic disorder: Secondary | ICD-10-CM | POA: Diagnosis present

## 2014-05-17 DIAGNOSIS — L988 Other specified disorders of the skin and subcutaneous tissue: Secondary | ICD-10-CM | POA: Diagnosis present

## 2014-05-17 DIAGNOSIS — N72 Inflammatory disease of cervix uteri: Secondary | ICD-10-CM | POA: Diagnosis present

## 2014-05-17 DIAGNOSIS — Z853 Personal history of malignant neoplasm of breast: Secondary | ICD-10-CM

## 2014-05-17 HISTORY — PX: ANTERIOR AND POSTERIOR REPAIR: SHX5121

## 2014-05-17 HISTORY — PX: CYSTOSCOPY: SHX5120

## 2014-05-17 HISTORY — PX: VAGINAL PROLAPSE REPAIR: SHX830

## 2014-05-17 HISTORY — PX: VAGINAL HYSTERECTOMY: SHX2639

## 2014-05-17 LAB — TYPE AND SCREEN
ABO/RH(D): O POS
Antibody Screen: NEGATIVE

## 2014-05-17 LAB — ABO/RH: ABO/RH(D): O POS

## 2014-05-17 SURGERY — HYSTERECTOMY, VAGINAL
Anesthesia: General | Site: Vagina

## 2014-05-17 MED ORDER — STERILE WATER FOR IRRIGATION IR SOLN
Status: DC | PRN
  Administered 2014-05-17: 1000 mL

## 2014-05-17 MED ORDER — DEXAMETHASONE SODIUM PHOSPHATE 10 MG/ML IJ SOLN
INTRAMUSCULAR | Status: AC
  Filled 2014-05-17: qty 1

## 2014-05-17 MED ORDER — ESTRADIOL 0.1 MG/GM VA CREA
TOPICAL_CREAM | VAGINAL | Status: DC | PRN
  Administered 2014-05-17: 1 via VAGINAL

## 2014-05-17 MED ORDER — GLYCOPYRROLATE 0.2 MG/ML IJ SOLN
INTRAMUSCULAR | Status: DC | PRN
  Administered 2014-05-17: .4 mg via INTRAVENOUS

## 2014-05-17 MED ORDER — ONDANSETRON HCL 4 MG/2ML IJ SOLN
4.0000 mg | Freq: Four times a day (QID) | INTRAMUSCULAR | Status: DC | PRN
Start: 2014-05-17 — End: 2014-05-18

## 2014-05-17 MED ORDER — FENTANYL CITRATE 0.05 MG/ML IJ SOLN
INTRAMUSCULAR | Status: AC
  Filled 2014-05-17: qty 5

## 2014-05-17 MED ORDER — FENTANYL CITRATE 0.05 MG/ML IJ SOLN
INTRAMUSCULAR | Status: DC | PRN
  Administered 2014-05-17 (×5): 50 ug via INTRAVENOUS

## 2014-05-17 MED ORDER — LIDOCAINE HCL (CARDIAC) 20 MG/ML IV SOLN
INTRAVENOUS | Status: AC
  Filled 2014-05-17: qty 5

## 2014-05-17 MED ORDER — ACETAMINOPHEN 325 MG PO TABS: 325.0000 mg | ORAL_TABLET | ORAL | Status: DC | PRN

## 2014-05-17 MED ORDER — BUPIVACAINE HCL (PF) 0.25 % IJ SOLN
INTRAMUSCULAR | Status: AC
  Filled 2014-05-17: qty 10

## 2014-05-17 MED ORDER — ACETAMINOPHEN 160 MG/5ML PO SOLN: 325.0000 mg | ORAL | Status: DC | PRN

## 2014-05-17 MED ORDER — CEFAZOLIN SODIUM-DEXTROSE 2-3 GM-% IV SOLR
2.0000 g | INTRAVENOUS | Status: AC
  Administered 2014-05-17: 2 g via INTRAVENOUS

## 2014-05-17 MED ORDER — EPHEDRINE SULFATE 50 MG/ML IJ SOLN
INTRAMUSCULAR | Status: DC | PRN
  Administered 2014-05-17 (×2): 5 mg via INTRAVENOUS

## 2014-05-17 MED ORDER — ESTRADIOL 0.1 MG/GM VA CREA
TOPICAL_CREAM | VAGINAL | Status: AC
  Filled 2014-05-17: qty 42.5

## 2014-05-17 MED ORDER — ONDANSETRON HCL 4 MG/2ML IJ SOLN
INTRAMUSCULAR | Status: AC
  Filled 2014-05-17: qty 2

## 2014-05-17 MED ORDER — NEOSTIGMINE METHYLSULFATE 10 MG/10ML IV SOLN
INTRAVENOUS | Status: DC | PRN
  Administered 2014-05-17: 3 mg via INTRAVENOUS

## 2014-05-17 MED ORDER — FENTANYL CITRATE 0.05 MG/ML IJ SOLN
INTRAMUSCULAR | Status: AC
  Filled 2014-05-17: qty 2

## 2014-05-17 MED ORDER — MENTHOL 3 MG MT LOZG
1.0000 | LOZENGE | OROMUCOSAL | Status: DC | PRN
  Filled 2014-05-17: qty 9

## 2014-05-17 MED ORDER — DEXTROSE IN LACTATED RINGERS 5 % IV SOLN: INTRAVENOUS | Status: DC

## 2014-05-17 MED ORDER — FENTANYL CITRATE 0.05 MG/ML IJ SOLN
25.0000 ug | INTRAMUSCULAR | Status: DC | PRN
  Administered 2014-05-17 (×4): 25 ug via INTRAVENOUS

## 2014-05-17 MED ORDER — PROMETHAZINE HCL 25 MG/ML IJ SOLN: 6.2500 mg | INTRAMUSCULAR | Status: DC | PRN

## 2014-05-17 MED ORDER — PANTOPRAZOLE SODIUM 40 MG PO TBEC: 40.0000 mg | DELAYED_RELEASE_TABLET | Freq: Every day | ORAL | Status: DC

## 2014-05-17 MED ORDER — LACTATED RINGERS IV SOLN
INTRAVENOUS | Status: DC
  Administered 2014-05-17: 08:00:00 via INTRAVENOUS
  Administered 2014-05-17: 1000 mL via INTRAVENOUS
  Administered 2014-05-17: 07:00:00 via INTRAVENOUS

## 2014-05-17 MED ORDER — PROPOFOL 10 MG/ML IV BOLUS
INTRAVENOUS | Status: DC | PRN
  Administered 2014-05-17: 100 mg via INTRAVENOUS

## 2014-05-17 MED ORDER — LIDOCAINE-EPINEPHRINE 0.5 %-1:200000 IJ SOLN
INTRAMUSCULAR | Status: AC
  Filled 2014-05-17: qty 1

## 2014-05-17 MED ORDER — ACETAMINOPHEN 10 MG/ML IV SOLN
1000.0000 mg | Freq: Once | INTRAVENOUS | Status: AC
  Administered 2014-05-17: 1000 mg via INTRAVENOUS
  Filled 2014-05-17: qty 100

## 2014-05-17 MED ORDER — NEOSTIGMINE METHYLSULFATE 10 MG/10ML IV SOLN
INTRAVENOUS | Status: AC
  Filled 2014-05-17: qty 1

## 2014-05-17 MED ORDER — IBUPROFEN 800 MG PO TABS
800.0000 mg | ORAL_TABLET | Freq: Three times a day (TID) | ORAL | Status: DC | PRN
  Administered 2014-05-18: 800 mg via ORAL
  Filled 2014-05-17: qty 1

## 2014-05-17 MED ORDER — ONDANSETRON HCL 4 MG/2ML IJ SOLN
INTRAMUSCULAR | Status: DC | PRN
  Administered 2014-05-17: 4 mg via INTRAVENOUS

## 2014-05-17 MED ORDER — EPHEDRINE 5 MG/ML INJ
INTRAVENOUS | Status: AC
  Filled 2014-05-17: qty 10

## 2014-05-17 MED ORDER — DEXAMETHASONE SODIUM PHOSPHATE 10 MG/ML IJ SOLN
INTRAMUSCULAR | Status: DC | PRN
  Administered 2014-05-17: 5 mg via INTRAVENOUS

## 2014-05-17 MED ORDER — OXYCODONE-ACETAMINOPHEN 5-325 MG PO TABS
1.0000 | ORAL_TABLET | ORAL | Status: DC | PRN
  Administered 2014-05-18 (×2): 1 via ORAL
  Filled 2014-05-17 (×2): qty 1

## 2014-05-17 MED ORDER — SODIUM CHLORIDE 0.9 % IR SOLN
Freq: Once | Status: AC
  Administered 2014-05-17: 08:00:00
  Filled 2014-05-17: qty 1

## 2014-05-17 MED ORDER — LIDOCAINE-EPINEPHRINE (PF) 1 %-1:200000 IJ SOLN
INTRAMUSCULAR | Status: DC | PRN
Start: 2014-05-17 — End: 2014-05-17
  Administered 2014-05-17: 40 mL

## 2014-05-17 MED ORDER — GLYCOPYRROLATE 0.2 MG/ML IJ SOLN
INTRAMUSCULAR | Status: AC
  Filled 2014-05-17: qty 3

## 2014-05-17 MED ORDER — ROCURONIUM BROMIDE 100 MG/10ML IV SOLN
INTRAVENOUS | Status: DC | PRN
  Administered 2014-05-17: 40 mg via INTRAVENOUS
  Administered 2014-05-17 (×3): 10 mg via INTRAVENOUS

## 2014-05-17 MED ORDER — CEFAZOLIN SODIUM-DEXTROSE 2-3 GM-% IV SOLR
INTRAVENOUS | Status: AC
  Filled 2014-05-17: qty 50

## 2014-05-17 MED ORDER — ONDANSETRON HCL 4 MG PO TABS: 4.0000 mg | ORAL_TABLET | Freq: Four times a day (QID) | ORAL | Status: DC | PRN

## 2014-05-17 MED ORDER — HYDROMORPHONE HCL PF 1 MG/ML IJ SOLN
0.2000 mg | INTRAMUSCULAR | Status: DC | PRN
  Administered 2014-05-17: 0.6 mg via INTRAVENOUS
  Filled 2014-05-17: qty 1

## 2014-05-17 MED ORDER — ROCURONIUM BROMIDE 100 MG/10ML IV SOLN
INTRAVENOUS | Status: AC
  Filled 2014-05-17: qty 1

## 2014-05-17 MED ORDER — LIDOCAINE HCL (CARDIAC) 20 MG/ML IV SOLN
INTRAVENOUS | Status: DC | PRN
  Administered 2014-05-17: 40 mg via INTRAVENOUS

## 2014-05-17 MED ORDER — PROPOFOL 10 MG/ML IV EMUL
INTRAVENOUS | Status: AC
  Filled 2014-05-17: qty 20

## 2014-05-17 SURGICAL SUPPLY — 62 items
ADH SKN CLS APL DERMABOND .7 (GAUZE/BANDAGES/DRESSINGS) ×3
ALLOGRAFT TUTOPLAST AXIS 6X12 (Tissue) ×3 IMPLANT
BLADE 15 SAFETY STRL DISP (BLADE) ×4 IMPLANT
CANISTER SUCT 3000ML (MISCELLANEOUS) ×8 IMPLANT
CATH FOLEY 2WAY SLVR  5CC 16FR (CATHETERS)
CATH FOLEY 2WAY SLVR 5CC 16FR (CATHETERS) IMPLANT
CATH ROBINSON RED A/P 16FR (CATHETERS) ×4 IMPLANT
CLOTH BEACON ORANGE TIMEOUT ST (SAFETY) ×4 IMPLANT
CONT PATH 16OZ SNAP LID 3702 (MISCELLANEOUS) IMPLANT
CONTAINER PREFILL 10% NBF 60ML (FORM) IMPLANT
DECANTER SPIKE VIAL GLASS SM (MISCELLANEOUS) ×8 IMPLANT
DERMABOND ADVANCED (GAUZE/BANDAGES/DRESSINGS) ×1
DERMABOND ADVANCED .7 DNX12 (GAUZE/BANDAGES/DRESSINGS) ×3 IMPLANT
DEVICE CAPIO SLIM SINGLE (INSTRUMENTS) ×4 IMPLANT
DRAIN PENROSE 1/4X12 LTX (DRAIN) ×4 IMPLANT
DRAPE HYSTEROSCOPY (DRAPE) ×4 IMPLANT
DRAPE STERI URO 9X17 APER PCH (DRAPES) ×4 IMPLANT
DRAPE UNDERBUTTOCKS STRL (DRAPE) ×4 IMPLANT
ELECT LIGASURE SHORT 9 REUSE (ELECTRODE) ×4 IMPLANT
GAUZE PACKING 2X5 YD STRL (GAUZE/BANDAGES/DRESSINGS) ×4 IMPLANT
GAUZE SPONGE 4X4 16PLY XRAY LF (GAUZE/BANDAGES/DRESSINGS) ×8 IMPLANT
GLOVE BIO SURGEON STRL SZ7.5 (GLOVE) ×4 IMPLANT
GLOVE BIO SURGEON STRL SZ8 (GLOVE) ×8 IMPLANT
GLOVE BIOGEL PI IND STRL 6.5 (GLOVE) ×3 IMPLANT
GLOVE BIOGEL PI IND STRL 7.0 (GLOVE) ×3 IMPLANT
GLOVE BIOGEL PI INDICATOR 6.5 (GLOVE) ×1
GLOVE BIOGEL PI INDICATOR 7.0 (GLOVE) ×1
GLOVE ECLIPSE 6.5 STRL STRAW (GLOVE) ×4 IMPLANT
GOWN STRL REUS W/TWL LRG LVL3 (GOWN DISPOSABLE) ×32 IMPLANT
NEEDLE HYPO 22GX1.5 SAFETY (NEEDLE) ×4 IMPLANT
NEEDLE MAYO 6 CRC TAPER PT (NEEDLE) ×4 IMPLANT
NEEDLE SPNL 22GX3.5 QUINCKE BK (NEEDLE) IMPLANT
NS IRRIG 1000ML POUR BTL (IV SOLUTION) ×8 IMPLANT
PACK VAGINAL WOMENS (CUSTOM PROCEDURE TRAY) ×4 IMPLANT
PAD OB MATERNITY 4.3X12.25 (PERSONAL CARE ITEMS) ×4 IMPLANT
PENCIL BUTTON HOLSTER BLD 10FT (ELECTRODE) ×4 IMPLANT
PLUG CATH AND CAP STER (CATHETERS) ×4 IMPLANT
RETRACTOR STAY HOOK 5MM (MISCELLANEOUS) ×4 IMPLANT
SET CYSTO W/LG BORE CLAMP LF (SET/KITS/TRAYS/PACK) ×4 IMPLANT
SHEET LAVH (DRAPES) ×4 IMPLANT
SUT CAPIO ETHIBPND (SUTURE) ×8 IMPLANT
SUT SILK 2 0 FS (SUTURE) IMPLANT
SUT VIC AB 0 CT1 27 (SUTURE) ×18
SUT VIC AB 0 CT1 27XBRD ANBCTR (SUTURE) ×6 IMPLANT
SUT VIC AB 0 CT1 27XCR 8 STRN (SUTURE) ×9 IMPLANT
SUT VIC AB 0 CT2 27 (SUTURE) IMPLANT
SUT VIC AB 2-0 CT1 (SUTURE) ×16 IMPLANT
SUT VIC AB 2-0 SH 27 (SUTURE) ×15
SUT VIC AB 2-0 SH 27XBRD (SUTURE) ×15 IMPLANT
SUT VIC AB 3-0 CT1 27 (SUTURE) ×4
SUT VIC AB 3-0 CT1 TAPERPNT 27 (SUTURE) ×3 IMPLANT
SUT VIC AB 3-0 PS2 18 (SUTURE)
SUT VIC AB 3-0 PS2 18XBRD (SUTURE) IMPLANT
SUT VIC AB 3-0 SH 27 (SUTURE) ×3
SUT VIC AB 3-0 SH 27X BRD (SUTURE) ×3 IMPLANT
SUT VICRYL 0 TIES 12 18 (SUTURE) ×4 IMPLANT
SUT VICRYL 0 UR6 27IN ABS (SUTURE) ×12 IMPLANT
TOWEL OR 17X24 6PK STRL BLUE (TOWEL DISPOSABLE) ×16 IMPLANT
TRAY FOLEY CATH 14FR (SET/KITS/TRAYS/PACK) ×8 IMPLANT
TUBING NON-CON 1/4 X 20 CONN (TUBING) ×4 IMPLANT
TUTOPLAST AXIS 6X12 (Tissue) ×4 IMPLANT
WATER STERILE IRR 1000ML POUR (IV SOLUTION) ×8 IMPLANT

## 2014-05-17 NOTE — Transfer of Care (Signed)
Immediate Anesthesia Transfer of Care Note  Patient: Kara Woods  Procedure(s) Performed: Procedure(s): HYSTERECTOMY VAGINAL with Bilateral Salpingo-Oophorectomy; Bladder cystotomy repair (Bilateral) CYSTOSCOPY (N/A) ANTERIOR (CYSTOCELE) AND POSTERIOR REPAIR (RECTOCELE) (N/A) VAGINAL VAULT PROLAPSE AND GRAFT (N/A)  Patient Location: PACU  Anesthesia Type:General  Level of Consciousness: awake, alert  and oriented  Airway & Oxygen Therapy: Patient Spontanous Breathing and Patient connected to nasal cannula oxygen  Post-op Assessment: Report given to PACU RN, Post -op Vital signs reviewed and stable and Patient moving all extremities X 4  Post vital signs: Reviewed and stable  Complications: No apparent anesthesia complications

## 2014-05-17 NOTE — Anesthesia Preprocedure Evaluation (Signed)
Anesthesia Evaluation  Patient identified by MRN, date of birth, ID band Patient awake    Reviewed: Allergy & Precautions, H&P , NPO status , Patient's Chart, lab work & pertinent test results  History of Anesthesia Complications Negative for: history of anesthetic complications  Airway Mallampati: II TM Distance: >3 FB     Dental  (+) Upper Dentures, Teeth Intact   Pulmonary shortness of breath, neg sleep apnea, neg COPDformer smoker,  breath sounds clear to auscultation        Cardiovascular - angina- Past MI and - DOE negative cardio ROS  - dysrhythmias Rhythm:Regular     Neuro/Psych PSYCHIATRIC DISORDERS Anxiety Depression negative neurological ROS     GI/Hepatic Neg liver ROS, GERD-  Controlled,  Endo/Other  negative endocrine ROS  Renal/GU negative Renal ROS     Musculoskeletal   Abdominal   Peds  Hematology   Anesthesia Other Findings   Reproductive/Obstetrics                           Anesthesia Physical Anesthesia Plan  ASA: II  Anesthesia Plan: General   Post-op Pain Management:    Induction: Intravenous  Airway Management Planned: Oral ETT  Additional Equipment: None  Intra-op Plan:   Post-operative Plan: Extubation in OR  Informed Consent: I have reviewed the patients History and Physical, chart, labs and discussed the procedure including the risks, benefits and alternatives for the proposed anesthesia with the patient or authorized representative who has indicated his/her understanding and acceptance.   Dental advisory given  Plan Discussed with: CRNA and Surgeon  Anesthesia Plan Comments:         Anesthesia Quick Evaluation

## 2014-05-17 NOTE — Anesthesia Postprocedure Evaluation (Signed)
  Anesthesia Post-op Note  Anesthesia Post Note  Patient: Kara Woods  Procedure(s) Performed: Procedure(s) (LRB): HYSTERECTOMY VAGINAL with Bilateral Salpingo-Oophorectomy; Bladder cystotomy repair (Bilateral) CYSTOSCOPY (N/A) ANTERIOR (CYSTOCELE) AND POSTERIOR REPAIR (RECTOCELE) (N/A) VAGINAL VAULT PROLAPSE AND GRAFT (N/A)  Anesthesia type: General  Patient location: PACU  Post pain: Pain level controlled  Post assessment: Post-op Vital signs reviewed  Last Vitals:  Filed Vitals:   05/17/14 1215  BP: 98/82  Pulse: 63  Temp:   Resp: 10    Post vital signs: Reviewed  Level of consciousness: sedated  Complications: No apparent anesthesia complications

## 2014-05-17 NOTE — Op Note (Signed)
Preoperative diagnosis: Vault prolapse, cystocele, small rectocele Postoperative diagnosis: Vault prolapse, cystocele, small rectocele, small cystotomy Surgery: Vault prolapse repair and cystocele repair and graft and repair of small cystotomy and cystoscopy Surgeon: Dr. Nicki Reaper Rachel Rison Assistant: Dr. Tresea Mall  The patient was prepped and draped in the usual fashion. Extra care was taken with leg positioning to minimize the risk of compartment syndrome and neuropathy and deep vein thrombosis. Preoperative antibiotics were given. Preoperative laboratory tests were normal.  Under anesthesia her cervix descended approximately 3 cm. She had a bulging central defect but her lateral fornices and apex were reasonably well supported. She had a mild visible posterior defect with a little bit of deficiency of the posterior fourchette  Dr. cousins did a transvaginal hysterectomy and removed the left ovary and tube and right tube. The ureteral sacral ligaments were healthy and tagged and she ran the posterior cuff for hemostasis  During the hysterectomy approximately a 2 mm midline cystotomy was made in the bladder. It was in the location of the dome. We had double checked the position of the bladder and I agreed that the tissue looked like peritoneum and not bladder mucosa. When opened it was obvious that it was a cystotomy. I oversewed the mucosa with  bladder wall with 2-0 Vicryl. I then did a running 2-0 Vicryl closing the bladder wall over the small cystotomy. I was very pleased with the repair. There was no leak throughout the case. Later in the case I did a third layer incorporating it into my anterior repair.  I cystoscoped the patient. I could see a dimple from my repair in the midline approximately 2 centimeters from the ureteral orifices bilaterally. There was no distortion of the ureters. There was excellent yellow jets bilaterally  I instilled 20 cc of a lidocaine epinephrine mixture  submucosally. Between 3 Allis clamps I made my usual long anterior vaginal wall incision and mobilized sharply the underlying pubocervical fascia from the overlying vaginal mucosa to the white line bilaterally. I was very happy with the mobilization at the apex.  I reinspected the bladder repair twice before I did my anterior repair. I did the anterior repair with running 2-0 Vicryl not imbricating the bladder neck and again providing a third layer for the small cystotomy.  I cystoscoped the patient and the findings were identical and she had a very good cystocele repair cystoscopically with excellent yellow jets bilaterally and no distortion so the ureter  I prepared a 10 x 6 fascia lata graft in the shape of a trapezoid. I finger dissected to the ischial spine that was prominent bilaterally. I swept soft tissue medially. I placed a 0 Ethibond 1 full finger breath medial to each spine in a straight line between the spines. The left suture was approximately a centimeter more caudal than the right. I was very pleased with the position and these were triple checked.  I did a rectal examination there was no injury to rectum or suture in the rectum  I did my usual sharp and blunt dissection at the urethrovesical angle and placed a 0 Vicryl on a UR 6 needle into the pelvic sidewall.  Between the 4 sutures the trapezoid graft was sutured in place tension-free.  I trimmed an appropriate amount of anterior vaginal wall and closed the anterior vaginal wall running 2-0 Vicryl on a CT1 needle.  Dr. cousins closed the cuff plicating the ureteral sacral ligaments  The patient had excellent vaginal length. I did a rectal examination  and she had diffuse mild weakness but nothing really to imbricate posteriorly. Leg position was excellent. Blood loss was less than 100 mL. Yellow urine was clear with no blood. Patient was stable taken to recover room.  Patient will Foley catheter approxi-1 week after surgery.  Based on the intraoperative findings I likely will not order a cystogram before a trial of voiding.

## 2014-05-17 NOTE — Progress Notes (Signed)
Looks good  Urine clear with good output No leg pain S/p pain- meds ordered Labs pending

## 2014-05-17 NOTE — Brief Op Note (Signed)
05/17/2014  11:38 AM  PATIENT:  Kara Woods  80 y.o. female  PRE-OPERATIVE DIAGNOSIS:  Cystocele, Uterovaginal Prolapse  POST-OPERATIVE DIAGNOSIS:  cystocele, uterovaginal prolapse, cystotomy  PROCEDURE:  Total vaginal hysterotomy, bilateral salpingectomy, left oophorectomy, cystoscopy, repair of cystotomy, vaginal vault suspension with graft, cystocele repair  SURGEON:  Surgeon(s) and Role: Panel 1:    * Makinzi Prieur Clint Bolder, MD - Primary  Panel 2:    * Reece Packer, MD - Primary  PHYSICIAN ASSISTANT:   ASSISTANTS: Dr Nicki Reaper MacDiarmid   ANESTHESIA:   general Findings: nl tubes, right ov normal, left ov nl, nl uterus, 2nd deg cystocele, 2nd uterine prolapse EBL:  Total I/O In: 1900 [I.V.:1900] Out: 525 [Urine:400; Blood:125]  BLOOD ADMINISTERED:none  DRAINS: none   LOCAL MEDICATIONS USED:  LIDOCAINE   SPECIMEN:  Source of Specimen:  uterus with both tubes, left ovary  DISPOSITION OF SPECIMEN:  PATHOLOGY  COUNTS:  YES  TOURNIQUET:  * No tourniquets in log *  DICTATION: .Other Dictation: Dictation Number [id number]  PLAN OF CARE: Admit for overnight observation  PATIENT DISPOSITION:  PACU - hemodynamically stable.   Delay start of Pharmacological VTE agent (>24hrs) due to surgical blood loss or risk of bleeding: no

## 2014-05-18 ENCOUNTER — Encounter (HOSPITAL_COMMUNITY): Payer: Self-pay | Admitting: Obstetrics and Gynecology

## 2014-05-18 LAB — BASIC METABOLIC PANEL
BUN: 11 mg/dL (ref 6–23)
CO2: 27 mEq/L (ref 19–32)
Calcium: 8.7 mg/dL (ref 8.4–10.5)
Chloride: 103 mEq/L (ref 96–112)
Creatinine, Ser: 0.78 mg/dL (ref 0.50–1.10)
GFR calc Af Amer: >90 mL/min (ref >90)
GFR calc non Af Amer: 78 mL/min — ABNORMAL LOW (ref >90)
Glucose, Bld: 92 mg/dL (ref 70–99)
Potassium: 4.3 mEq/L (ref 3.7–5.3)
Sodium: 139 mEq/L (ref 137–147)

## 2014-05-18 LAB — CBC
HCT: 34.7 % — ABNORMAL LOW (ref 36.0–46.0)
Hemoglobin: 11.3 g/dL — ABNORMAL LOW (ref 12.0–15.0)
MCH: 30.3 pg (ref 26.0–34.0)
MCHC: 32.6 g/dL (ref 30.0–36.0)
MCV: 93 fL (ref 78.0–100.0)
Platelets: 187 10*3/uL (ref 150–400)
RBC: 3.73 MIL/uL — ABNORMAL LOW (ref 3.87–5.11)
RDW: 13.5 % (ref 11.5–15.5)
WBC: 11.5 10*3/uL — ABNORMAL HIGH (ref 4.0–10.5)

## 2014-05-18 MED ORDER — IBUPROFEN 800 MG PO TABS: 800.0000 mg | ORAL_TABLET | Freq: Three times a day (TID) | ORAL | Status: DC | PRN

## 2014-05-18 MED ORDER — OXYCODONE-ACETAMINOPHEN 5-325 MG PO TABS: 1.0000 | ORAL_TABLET | ORAL | Status: DC | PRN

## 2014-05-18 MED ORDER — SIMETHICONE 80 MG PO CHEW
80.0000 mg | CHEWABLE_TABLET | Freq: Four times a day (QID) | ORAL | Status: DC | PRN
  Filled 2014-05-18: qty 1

## 2014-05-18 MED ORDER — SIMETHICONE 80 MG PO CHEW: 80.0000 mg | CHEWABLE_TABLET | Freq: Four times a day (QID) | ORAL | Status: DC | PRN

## 2014-05-18 NOTE — Progress Notes (Signed)
Patient's vaginal packing removed. Minimal amount of drainage, no clots present. Patient tolerated well.

## 2014-05-18 NOTE — Progress Notes (Signed)
Send home when ok with macrodantin daily and oxy-ER 10 mg daily i will call tomorrow and organize foley out next friday

## 2014-05-18 NOTE — Discharge Summary (Signed)
Physician Discharge Summary  Patient ID: Kara Woods MRN: [id number] DOB/AGE: [id number] 78 y.o.  Admit date: 05/17/2014  Discharge date: 05/18/2014  Admission Diagnoses: uterovaginal prolapse, cystocele  Discharge Diagnoses: uterovaginal prolapse, cystocele, vault prolapse, incidental cystotomy Active Problems:   S/P vaginal hysterectomy   Discharged Condition: stable  Hospital Course: pt was admitted to [facility] where she underwent total vaginal hysterectomy, bilateral salpingectomy, left oophorectomy, cystotomy repair, vault suspension with graft, cystoscopy. Postop course unremarkable. Pt is been sent on home with leg bag due to incidental cystotomy. Tolerated regular diet  Consults: None  Significant Diagnostic Studies: labs:  CBC    Component Value Date/Time   WBC 11.5* 05/18/2014 0540   RBC 3.73* 05/18/2014 0540   HGB 11.3* 05/18/2014 0540   HCT 34.7* 05/18/2014 0540   PLT 187 05/18/2014 0540   MCV 93.0 05/18/2014 0540   MCH 30.3 05/18/2014 0540   MCHC 32.6 05/18/2014 0540   RDW 13.5 05/18/2014 0540   LYMPHSABS 3.5 08/07/2010 1546   MONOABS 0.6 08/07/2010 1546   EOSABS 0.1 08/07/2010 1546   BASOSABS 0.0 08/07/2010 1546    BMET    Component Value Date/Time   NA 139 05/18/2014 0540   K 4.3 05/18/2014 0540   CL 103 05/18/2014 0540   CO2 27 05/18/2014 0540   GLUCOSE 92 05/18/2014 0540   BUN 11 05/18/2014 0540   CREATININE 0.78 05/18/2014 0540   CALCIUM 8.7 05/18/2014 0540   GFRNONAA 78* 05/18/2014 0540   GFRAA >90 05/18/2014 0540      Treatments: surgery: cystoscopy, TVH, bilateral salpingectomy, left oophorectomy, cystotomy repair, vaginal vault suspension with graft, cystocele repair,   Discharge Exam: Blood pressure 120/53, pulse 57, temperature 97.7 F (36.5 C), temperature source Oral, resp. rate 16, height 5' 6.5" (1.689 m), weight 73.029 kg (161 lb), SpO2 92.00%. General appearance: alert, cooperative and no distress Back: no tenderness to percussion or palpation Resp:  clear to auscultation bilaterally Cardio: regular rate and rhythm, S1, S2 normal, no murmur, click, rub or gallop GI: soft, non-tender; bowel sounds normal; no masses,  no organomegaly Pelvic: deferred Extremities: no edema, redness or tenderness in the calves or thighs pad scant  dried blood  Disposition: 01-Home or Self Care  Discharge Instructions   Diet general    Complete by:  As directed      Discharge patient    Complete by:  As directed      May walk up steps    Complete by:  As directed      No wound care    Complete by:  As directed             Medication List         acetaminophen 325 MG tablet  Commonly known as:  TYLENOL  Take 650 mg by mouth every 6 (six) hours as needed.     clorazepate 7.5 MG tablet  Commonly known as:  TRANXENE  Take 7.5 mg by mouth 2 (two) times daily as needed for anxiety.     ibuprofen 800 MG tablet  Commonly known as:  ADVIL,MOTRIN  Take 1 tablet (800 mg total) by mouth every 8 (eight) hours as needed (mild pain).     LOTEMAX 0.5 % ophthalmic suspension  Generic drug:  loteprednol  Place 1 drop into both eyes 4 (four) times daily as needed.     oxyCODONE-acetaminophen 5-325 MG per tablet  Commonly known as:  PERCOCET/ROXICET  Take 1-2 tablets by mouth every 4 (four) hours  as needed for severe pain (moderate to severe pain (when tolerating fluids)).     simethicone 80 MG chewable tablet  Commonly known as:  MYLICON  Chew 1 tablet (80 mg total) by mouth 4 (four) times daily as needed for flatulence.     vitamin C 500 MG tablet  Commonly known as:  ASCORBIC ACID  Take 500 mg by mouth daily. Just stopped taking      script: macrodantoin 100mg  po bid,  Oxy-ER 10mg  po qd     Follow-up Information   Follow up with Jeanine Caven A, MD In 6 weeks.   Specialty:  Obstetrics and Gynecology   Contact information:   [place] [address] [phone]      Follow up with Dr Matilde Sprang: 7/3 for  Leg bag  removal Signed: Dann Ventress A 05/18/2014, 9:19 AM

## 2014-05-18 NOTE — Progress Notes (Signed)
Subjective: Patient reports tolerating PO.    Objective: I have reviewed patient's vital signs.  vital signs, intake and output and labs. Filed Vitals:   05/18/14 0603  BP: 120/53  Pulse: 57  Temp: 97.7 F (36.5 C)  Resp: 16   I/O last 3 completed shifts: In: 3761.7 [P.O.:360; I.V.:3401.7] Out: 2500 [Urine:2375; Blood:125] Total I/O In: 240 [P.O.:240] Out: -   Lab Results  Component Value Date   WBC 11.5* 05/18/2014   HGB 11.3* 05/18/2014   HCT 34.7* 05/18/2014   MCV 93.0 05/18/2014   PLT 187 05/18/2014   Lab Results  Component Value Date   CREATININE 0.78 05/18/2014    EXAM General: alert, cooperative and no distress Resp: clear to auscultation bilaterally Cardio: regular rate and rhythm, S1, S2 normal, no murmur, click, rub or gallop GI: soft, non-tender; bowel sounds normal; no masses,  no organomegaly Extremities: no edema, redness or tenderness in the calves or thighs Vaginal Bleeding: minimal  Assessment: s/p Procedure(s) TVH, bilateral salpingectomy, left oophorectomy, cystoscopy, repair of cystotomy, : stable, progressing well and tolerating diet  Plan: Advance diet Encourage ambulation Discontinue IV fluids Discharge home return to Dr MacDiarmid's office for leg bag removal. reviewed how to empty urine from bag D/c instructions reviewed with pt and her daughter F/u instructions reviewed. BM regimen mgmt disc as well  LOS: 1 day     Kara Woods A, MD 05/18/2014 9:19 AM    05/18/2014, 9:19 AM

## 2014-05-18 NOTE — Progress Notes (Signed)
Vitals normal Labs normal Discussed foley again D/c pack

## 2014-05-18 NOTE — Discharge Instructions (Signed)
Call if temperature greater than equal to 100.4, nothing per vagina for 4-6 weeks or severe nausea vomiting, increased incisional pain , , no straining with bowel movements, showers no bath

## 2014-05-18 NOTE — Progress Notes (Signed)
Ur chart review completed.  

## 2014-05-18 NOTE — Op Note (Signed)
NAMEKRYSTALYN, KUBOTA NO.:  [id number]  MEDICAL RECORD NO.:  [id number]  LOCATION:  9305                          FACILITY:  [facility]  PHYSICIAN:  Servando Salina, M.D.DATE OF BIRTH:  12/23/1932  DATE OF PROCEDURE:  05/17/2014 DATE OF DISCHARGE:                              OPERATIVE REPORT   PREOPERATIVE DIAGNOSIS:  Uterovaginal prolapse, cystocele.  POSTOPERATIVE DIAGNOSIS:  Second-degree uterovaginal prolapse, third- degree cystocele.  PROCEDURE:  Total vaginal hysterectomy, bilateral salpingectomy, left oophorectomy.  ANESTHESIA:  General.  SURGEON:  Servando Salina, MD  ASSISTANT:  Scott A. MacDiarmid, MD  DESCRIPTION OF PROCEDURE:  Under adequate general anesthesia, the patient was positioned in the dorsal lithotomy position.  Her legs were positioned prior to being draped.  She was sterilely prepped and draped in usual fashion.  An indwelling Foley catheter was sterilely placed.  A 2nd/3rd degree cystocele was noted as was the uterovaginal prolapse.  A weighted speculum was placed in the vagina.  Jacobson clamps were placed on the anterior and posterior lip of the cervix.  The cervix was parous, bulbous and large.  The uterus was positioned back in its normal anatomic location to assess the cervicovaginal junction.  At that junction 1% lidocaine with 1:200,000 ml epinephrine was injected circumferentially.  There was a small superficial ulceration noted posteriorly from the patient's prior pessary use.  A circumferential incision was made at the cervicovaginal junction using the scalpel.  Posteriorly, the Mayo scissors was then used to open the posterior cul-de-sac and extended transversely.  The posterior vaginal cuff was then oversewn with 0 Vicryl in a running locked stitch.  Weighted speculum was then repositioned inside the abdominal cavity.  The patient was placed in Trendelenburg position due to bowel loops, and omentum noted in the field.   Using vaginal side wall retractors and Sims retractor, the uterus was further exposed.  Uterosacral ligaments were bilaterally clamped, cut, suture ligated with 0 Vicryl suture. Attention was then turned anteriorly.  Sharp dissection with Metzenbaum was used to undermine the area for the bladder and then what appeared to be a peritoneal surface was then subsequently identified and in making a small opening, egress of urine stained with Pyridium was noted confirming incidental very small cystotomy.  This was identified, quickly closed by Dr. Matilde Sprang.  Please see his dictation regarding the closure.  Once the cystotomy was closed, reinspection of where the bladder was adherent to the uterus was re-identified.  Sharp dissection was done with subsequent entry into this anterior cul-de-sac. The Sims retractor was then placed to displace the bladder superiorly. Using the LigaSure, the cardinal ligaments was bilaterally clamped, cauterized, and cut.  Uterine vessels bilaterally were clamped, cauterized, and then cut.  The bowel packings were then used to displace the bowels superiorly.  Using a Kary Kos attempted identifying the fallopian tubes bilaterally, was started on the patient's right side with the distal end of the tube being fully seen, but both ovary and tube distally was noted to be high in the vault.  Similarly, the tube was identified on the patient's left, a small ovary was noted.  Decision was then made to bilaterally double clamp the uteroovarian ligament bilaterally and  double clamp both sides and cut.  Uterus and cervix were then removed at that point, and using Kelly clamps on the left side, the fallopian tube was identified to the fimbriated end.  There was some separation of the mesosalpinx at that point.  The ovary was noted to be normal, but again high in the field and partially obscured with the bowels despite the bowels being placed superiorly.  At that point careful  with the use of the LigaSure, mesosalpinx was serially clamped, cauterized, and then cut.  The right ovary was still though normal was not able to be comfortably remove and therefore was left the uteroovarian pedicle, was then free tied with 0 Vicryl x2.  On the opposite side, exposure allowed for the infundibulopelvic ligament to be identified with the ovary and that was serially clamped, cauterized, and then cut with removal of both the left ovary and tube.  The procedure was then turned over to Dr. Matilde Sprang for the cystocele repair, the vaginal vault suspension and cystoscopy.  Again see the dictated operative report for this the details.  Once this portion of the case was completed, I then closed the vaginal cuff in a vertical fashion with the uterosacral ligaments being plicated in the midline.  Using a 0 Vicryl figure-of-eight sutures, the vaginal cuff was then closed.  There was no need for any rectocele repair.  The vagina was then irrigated.  Good hemostasis was noted.  Cystoscopy was performed after the removal of the uterus and adnexa.  The vagina was then packed with estrogen-laced packing and indwelling Foley catheter was in place.  SPECIMEN:  Uterus, cervix, fallopian tubes x2, and left ovary sent to Pathology.  ESTIMATED BLOOD LOSS:  125 mL.  FLUIDS:  2 L.  URINE OUTPUT:  400 mL.  Sponge and instrument counts were correct.  COMPLICATION:  Incidental cystotomy.  The patient tolerated the procedure well, was transferred to recovery room in stable condition.     Servando Salina, M.D.     [place]/MEDQ  D:  05/18/2014  T:  05/18/2014  Job:  [id number]

## 2014-05-19 ENCOUNTER — Other Ambulatory Visit (HOSPITAL_COMMUNITY): Payer: Self-pay | Admitting: Urology

## 2014-05-19 DIAGNOSIS — N816 Rectocele: Secondary | ICD-10-CM

## 2014-05-19 DIAGNOSIS — N8111 Cystocele, midline: Secondary | ICD-10-CM

## 2014-05-26 ENCOUNTER — Ambulatory Visit (HOSPITAL_COMMUNITY)
Admission: RE | Admit: 2014-05-26 | Discharge: 2014-05-26 | Disposition: A | Discharge status: Home / Self Care | Payer: Medicare Other | Source: Ambulatory Visit | Attending: Urology | Admitting: Urology

## 2014-05-26 DIAGNOSIS — X58XXXA Exposure to other specified factors, initial encounter: Secondary | ICD-10-CM | POA: Insufficient documentation

## 2014-05-26 DIAGNOSIS — S3720XA Unspecified injury of bladder, initial encounter: Secondary | ICD-10-CM | POA: Insufficient documentation

## 2014-05-26 DIAGNOSIS — S3730XA Unspecified injury of urethra, initial encounter: Secondary | ICD-10-CM | POA: Insufficient documentation

## 2014-05-26 DIAGNOSIS — N8111 Cystocele, midline: Secondary | ICD-10-CM

## 2014-05-26 DIAGNOSIS — N329 Bladder disorder, unspecified: Secondary | ICD-10-CM | POA: Diagnosis not present

## 2014-05-26 DIAGNOSIS — N816 Rectocele: Secondary | ICD-10-CM

## 2014-05-26 IMAGING — CR DG VCUG
8 series · 8 of 8 positions shown · non-contrast
Comparison: None.

CLINICAL DATA: History of bladder injury.

EXAM:
VOIDING CYSTOURETHROGRAM
TECHNIQUE: After catheterization of the urinary bladder following sterile
technique by nursing personnel, the bladder was filled with 100 ml
Cysto-hypaque 30% by drip infusion. Serial spot images were obtained
during bladder filling and voiding.
FLUOROSCOPY TIME:  1 min and 30 seconds

[run (1 of 6)]
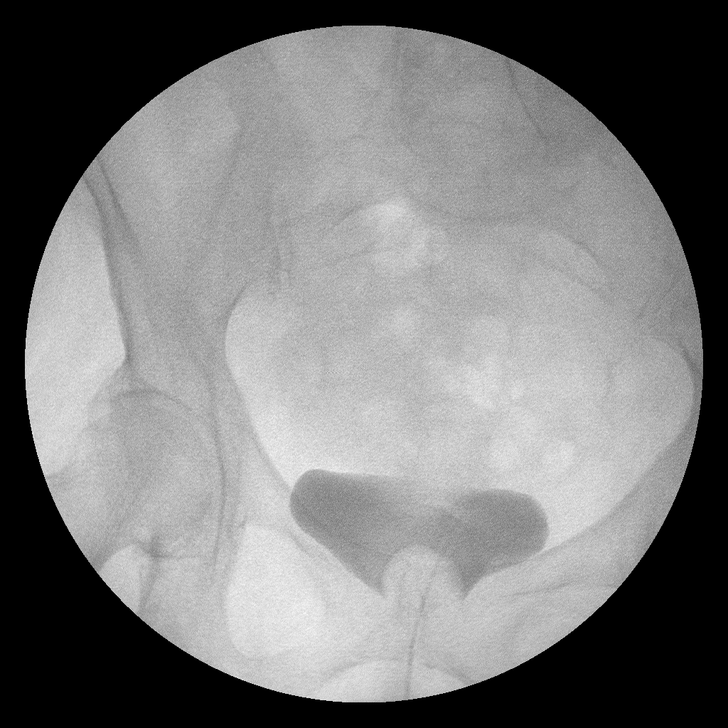

[run (2 of 6)]
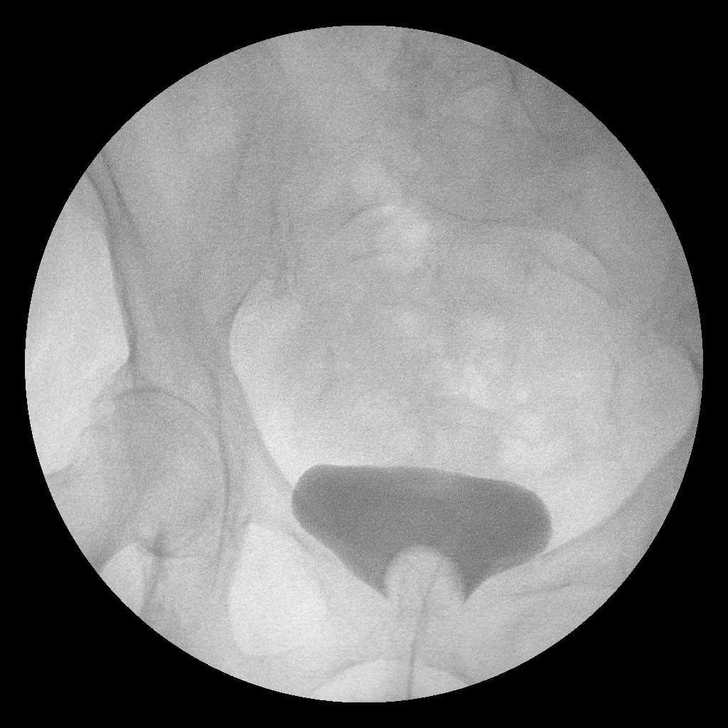

[run (3 of 6)]
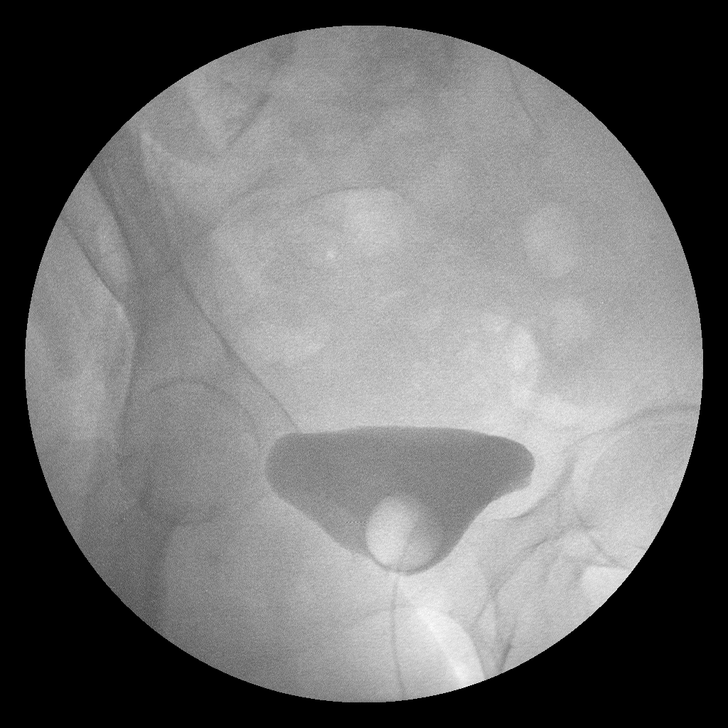

[run (4 of 6)]
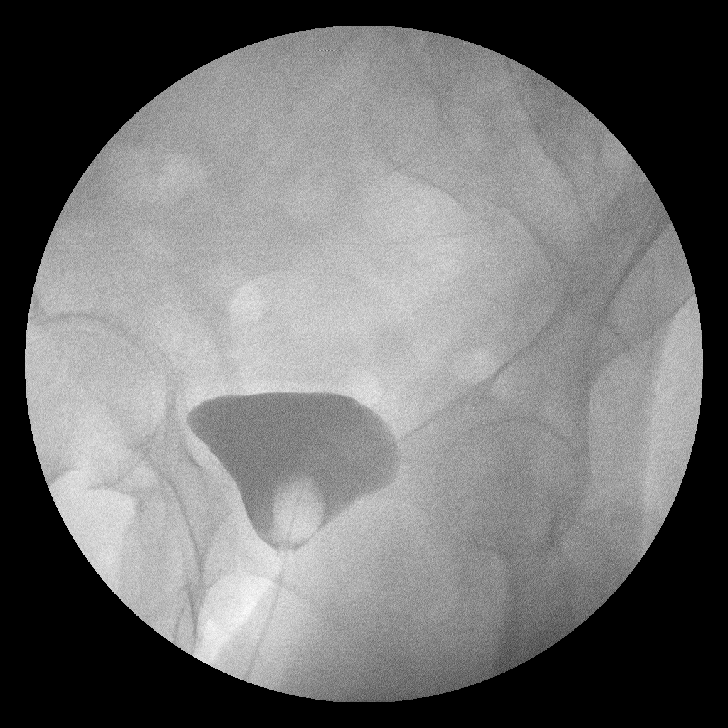

[run (5 of 6)]
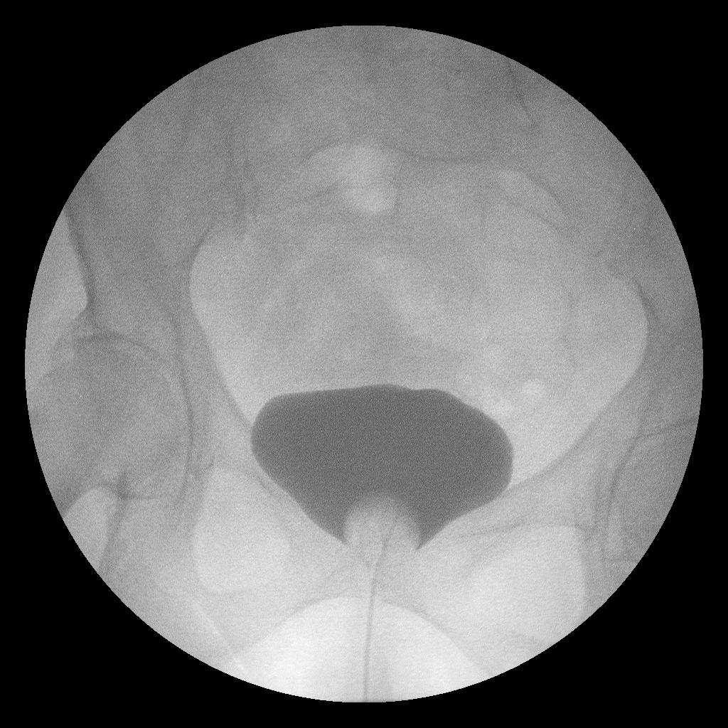

[run (6 of 6)]
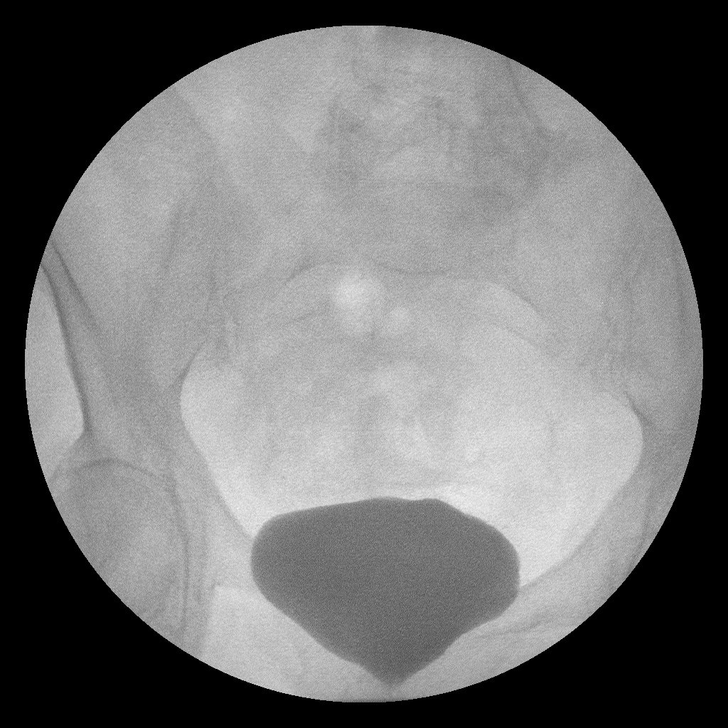

[view not recorded (1 of 2)]
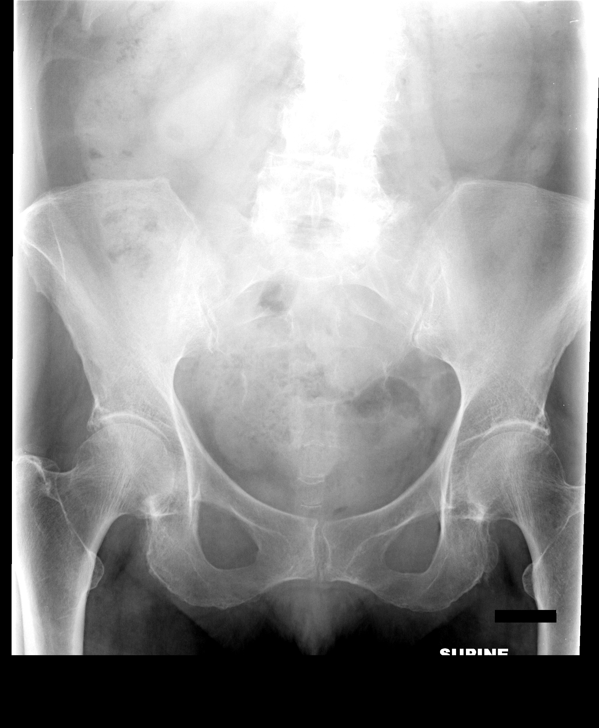

[view not recorded (2 of 2)]
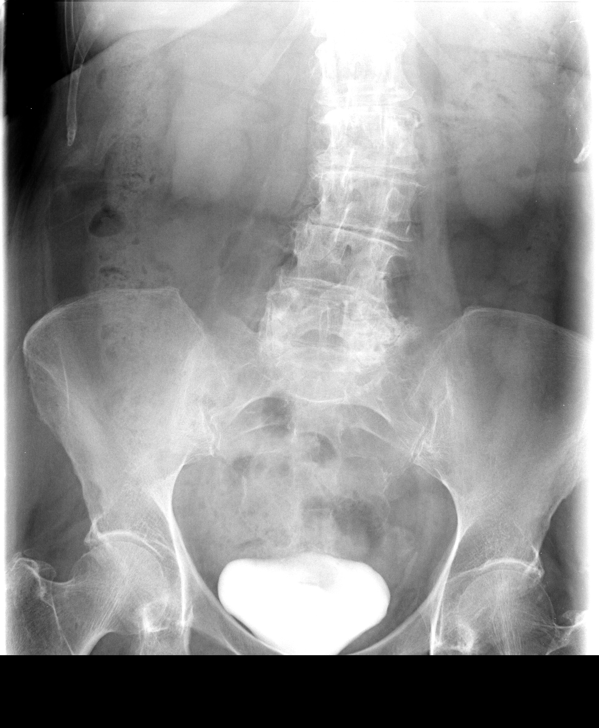

[8 of 8 positions shown; findings below may reference images not displayed]

FINDINGS: The bladder is normal. No extravasation of contrast was
demonstrated. The bladder wall is smooth. No diverticuli or
trabeculation. The Foley catheter was removed. The patient was not
able to void on the table. A postvoiding film was obtained after the
patient went to the bathroom. No contrast extravasation.
IMPRESSION: Normal cystogram.

## 2014-05-26 MED ORDER — DIATRIZOATE MEGLUMINE 30 % UR SOLN
Freq: Once | URETHRAL | Status: AC | PRN
  Administered 2014-05-26: 100 mL

## 2014-05-28 ENCOUNTER — Emergency Department (HOSPITAL_COMMUNITY)
Admission: EM | Admit: 2014-05-28 | Discharge: 2014-05-28 | Disposition: A | Discharge status: Home / Self Care | Payer: Medicare Other | Attending: Emergency Medicine | Admitting: Emergency Medicine

## 2014-05-28 ENCOUNTER — Emergency Department (HOSPITAL_COMMUNITY): Payer: Medicare Other

## 2014-05-28 ENCOUNTER — Encounter (HOSPITAL_COMMUNITY): Payer: Self-pay | Admitting: Emergency Medicine

## 2014-05-28 DIAGNOSIS — Z8719 Personal history of other diseases of the digestive system: Secondary | ICD-10-CM | POA: Diagnosis not present

## 2014-05-28 DIAGNOSIS — Z87891 Personal history of nicotine dependence: Secondary | ICD-10-CM | POA: Insufficient documentation

## 2014-05-28 DIAGNOSIS — Z8742 Personal history of other diseases of the female genital tract: Secondary | ICD-10-CM | POA: Diagnosis not present

## 2014-05-28 DIAGNOSIS — Z79899 Other long term (current) drug therapy: Secondary | ICD-10-CM | POA: Insufficient documentation

## 2014-05-28 DIAGNOSIS — H5440 Blindness, one eye, unspecified eye: Secondary | ICD-10-CM | POA: Insufficient documentation

## 2014-05-28 DIAGNOSIS — M129 Arthropathy, unspecified: Secondary | ICD-10-CM | POA: Insufficient documentation

## 2014-05-28 DIAGNOSIS — Z853 Personal history of malignant neoplasm of breast: Secondary | ICD-10-CM | POA: Insufficient documentation

## 2014-05-28 DIAGNOSIS — R109 Unspecified abdominal pain: Secondary | ICD-10-CM | POA: Diagnosis not present

## 2014-05-28 DIAGNOSIS — H9190 Unspecified hearing loss, unspecified ear: Secondary | ICD-10-CM | POA: Insufficient documentation

## 2014-05-28 DIAGNOSIS — R918 Other nonspecific abnormal finding of lung field: Secondary | ICD-10-CM | POA: Diagnosis not present

## 2014-05-28 DIAGNOSIS — K8020 Calculus of gallbladder without cholecystitis without obstruction: Secondary | ICD-10-CM | POA: Diagnosis not present

## 2014-05-28 DIAGNOSIS — F411 Generalized anxiety disorder: Secondary | ICD-10-CM | POA: Diagnosis not present

## 2014-05-28 DIAGNOSIS — Z9889 Other specified postprocedural states: Secondary | ICD-10-CM | POA: Insufficient documentation

## 2014-05-28 DIAGNOSIS — N390 Urinary tract infection, site not specified: Secondary | ICD-10-CM | POA: Diagnosis not present

## 2014-05-28 DIAGNOSIS — R6883 Chills (without fever): Secondary | ICD-10-CM | POA: Diagnosis not present

## 2014-05-28 DIAGNOSIS — R112 Nausea with vomiting, unspecified: Secondary | ICD-10-CM | POA: Diagnosis not present

## 2014-05-28 LAB — COMPREHENSIVE METABOLIC PANEL
ALT: 9 U/L (ref 0–35)
AST: 15 U/L (ref 0–37)
Albumin: 3.4 g/dL — ABNORMAL LOW (ref 3.5–5.2)
Alkaline Phosphatase: 75 U/L (ref 39–117)
Anion gap: 13 (ref 5–15)
BUN: 12 mg/dL (ref 6–23)
CO2: 23 mEq/L (ref 19–32)
Calcium: 9.2 mg/dL (ref 8.4–10.5)
Chloride: 102 mEq/L (ref 96–112)
Creatinine, Ser: 0.78 mg/dL (ref 0.50–1.10)
GFR calc Af Amer: >90 mL/min (ref >90)
GFR calc non Af Amer: 78 mL/min — ABNORMAL LOW (ref >90)
Glucose, Bld: 104 mg/dL — ABNORMAL HIGH (ref 70–99)
Potassium: 4.1 mEq/L (ref 3.7–5.3)
Sodium: 138 mEq/L (ref 137–147)
Total Bilirubin: 0.3 mg/dL (ref 0.3–1.2)
Total Protein: 7.7 g/dL (ref 6.0–8.3)

## 2014-05-28 LAB — CBC WITH DIFFERENTIAL/PLATELET
Basophils Absolute: 0 10*3/uL (ref 0.0–0.1)
Basophils Relative: 0 % (ref 0–1)
Eosinophils Absolute: 0.2 10*3/uL (ref 0.0–0.7)
Eosinophils Relative: 2 % (ref 0–5)
HCT: 37.8 % (ref 36.0–46.0)
Hemoglobin: 12.6 g/dL (ref 12.0–15.0)
Lymphocytes Relative: 27 % (ref 12–46)
Lymphs Abs: 2.3 10*3/uL (ref 0.7–4.0)
MCH: 30.4 pg (ref 26.0–34.0)
MCHC: 33.3 g/dL (ref 30.0–36.0)
MCV: 91.1 fL (ref 78.0–100.0)
Monocytes Absolute: 0.7 10*3/uL (ref 0.1–1.0)
Monocytes Relative: 8 % (ref 3–12)
Neutro Abs: 5.3 10*3/uL (ref 1.7–7.7)
Neutrophils Relative %: 63 % (ref 43–77)
Platelets: 288 10*3/uL (ref 150–400)
RBC: 4.15 MIL/uL (ref 3.87–5.11)
RDW: 13.4 % (ref 11.5–15.5)
WBC: 8.6 10*3/uL (ref 4.0–10.5)

## 2014-05-28 LAB — I-STAT CHEM 8, ED
BUN: 11 mg/dL (ref 6–23)
Calcium, Ion: 1.17 mmol/L (ref 1.13–1.30)
Chloride: 105 mEq/L (ref 96–112)
Creatinine, Ser: 0.8 mg/dL (ref 0.50–1.10)
Glucose, Bld: 100 mg/dL — ABNORMAL HIGH (ref 70–99)
HCT: 40 % (ref 36.0–46.0)
Hemoglobin: 13.6 g/dL (ref 12.0–15.0)
Potassium: 4 mEq/L (ref 3.7–5.3)
Sodium: 142 mEq/L (ref 137–147)
TCO2: 23 mmol/L (ref 0–100)

## 2014-05-28 LAB — URINALYSIS, ROUTINE W REFLEX MICROSCOPIC
Bilirubin Urine: NEGATIVE
Glucose, UA: NEGATIVE mg/dL
Ketones, ur: NEGATIVE mg/dL
Nitrite: NEGATIVE
Protein, ur: 100 mg/dL — AB
Specific Gravity, Urine: 1.018 (ref 1.005–1.030)
Urobilinogen, UA: 0.2 mg/dL (ref 0.0–1.0)
pH: 6 (ref 5.0–8.0)

## 2014-05-28 LAB — URINE MICROSCOPIC-ADD ON

## 2014-05-28 IMAGING — CT CT ABD-PELV W/ CM
1 of 4 series · 13 of 32 positions shown, 19 images · IV contrast (OMNIPAQUE 300)
Comparison: None.

CLINICAL DATA: Vaginal hysterectomy [DATE] ABDOMINAL PAIN, FEVER

EXAM:
CT ABDOMEN AND PELVIS WITH CONTRAST
TECHNIQUE: Multidetector CT imaging of the abdomen and pelvis was performed
using the standard protocol following bolus administration of
intravenous contrast.
CONTRAST:  50mL OMNIPAQUE IOHEXOL 300 MG/ML SOLN, 100mL OMNIPAQUE
IOHEXOL 300 MG/ML SOLN

[Series 2: abd/pel with · axial · 0.73mm/px · z∈[-469,-64]mm · 13 of 95 slices shown, 19 images]
[im 7/95  soft-tissue]
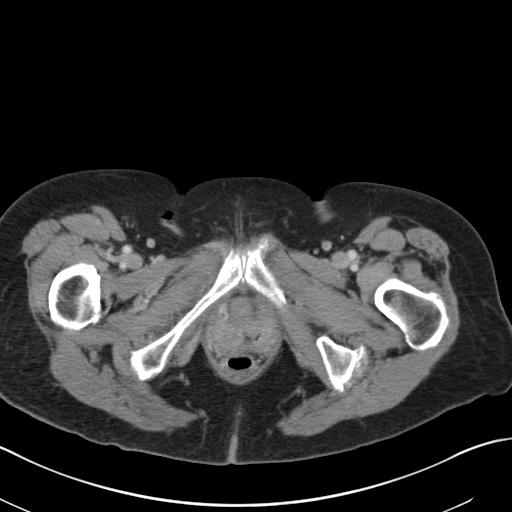
[im 7/95  bone]
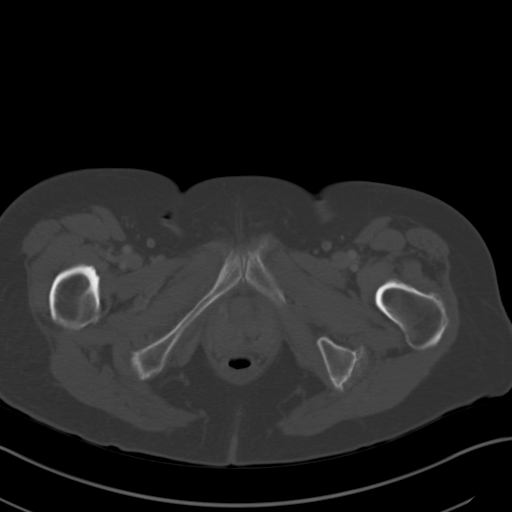
[im 13/95  soft-tissue]
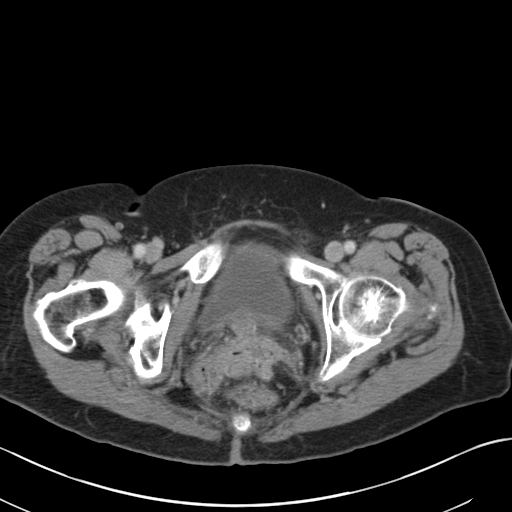
[im 19/95  soft-tissue]
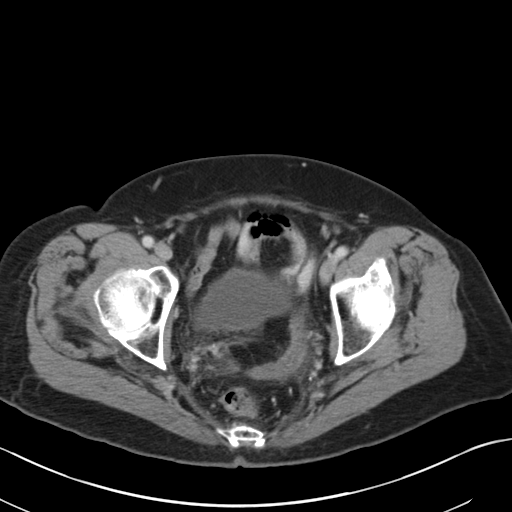
[im 26/95  soft-tissue]
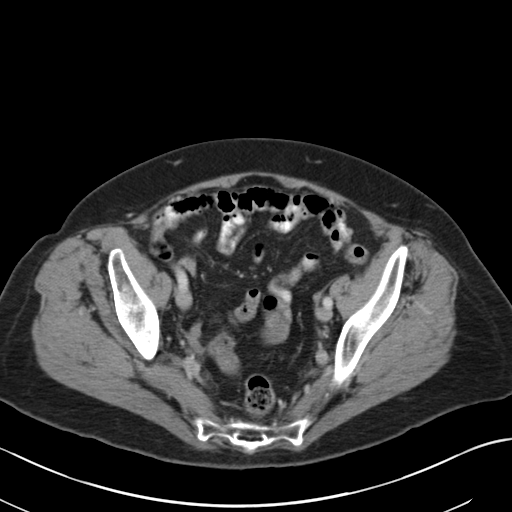
[im 32/95  soft-tissue]
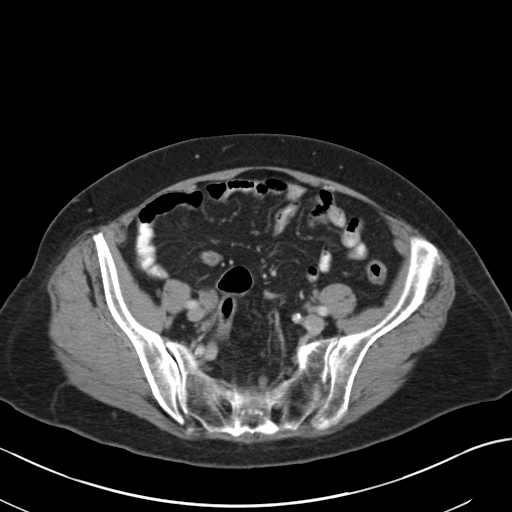
[im 38/95  soft-tissue]
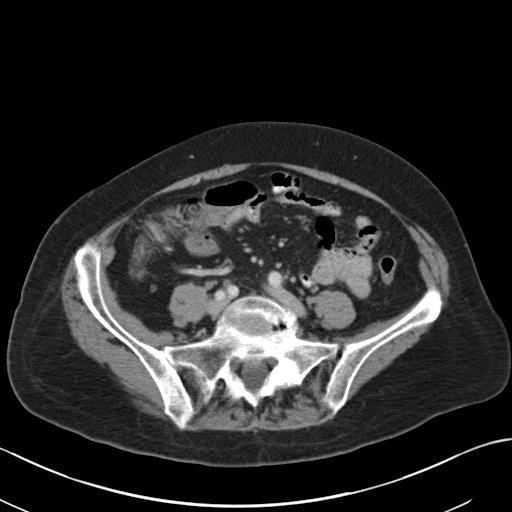
[im 51/95  soft-tissue]
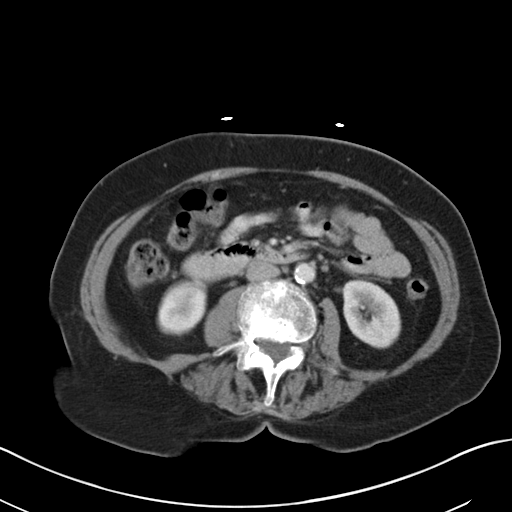
[im 57/95  soft-tissue]
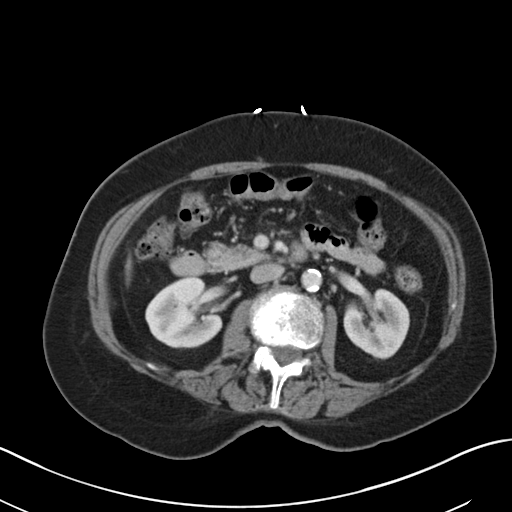
[im 63/95  soft-tissue]
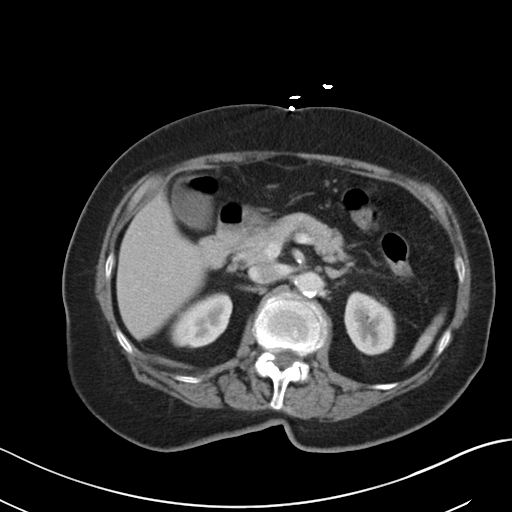
[im 63/95  bone]
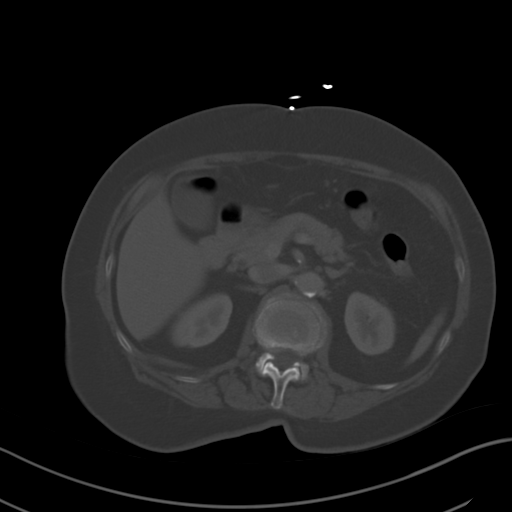
[im 69/95  soft-tissue]
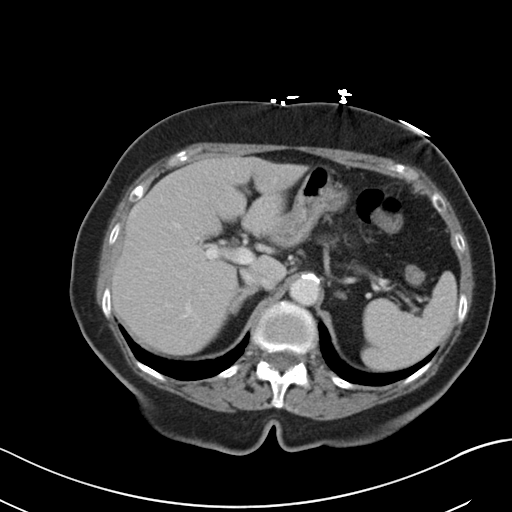
[im 69/95  lung]
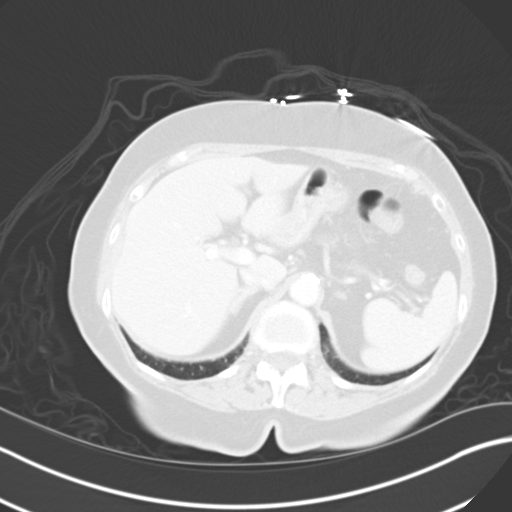
[im 76/95  soft-tissue]
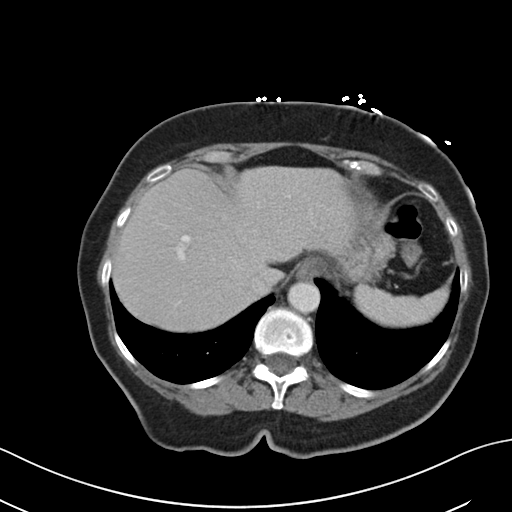
[im 76/95  lung]
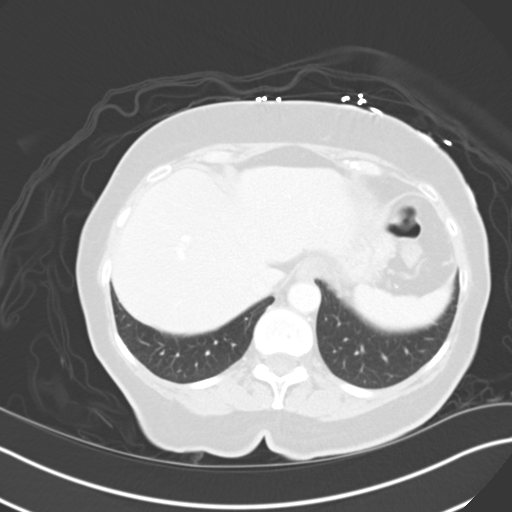
[im 82/95  soft-tissue]
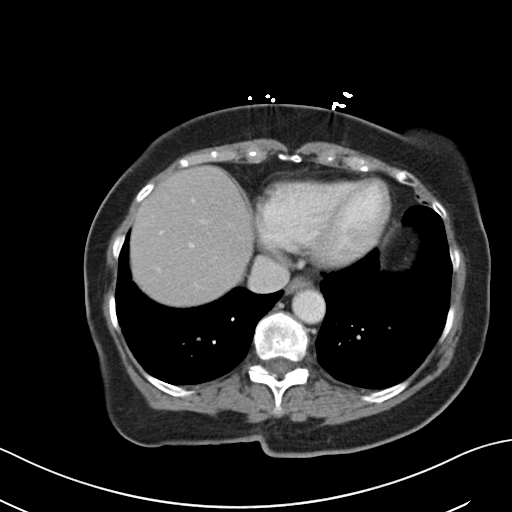
[im 82/95  lung]
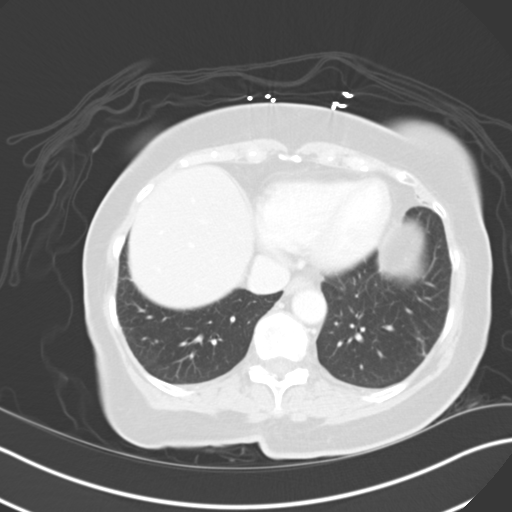
[im 88/95  soft-tissue]
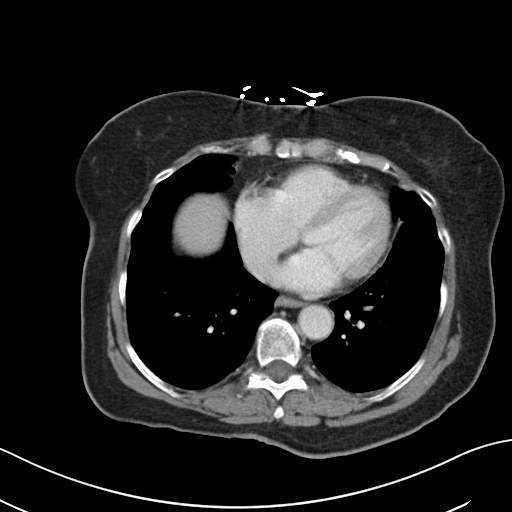
[im 88/95  lung]
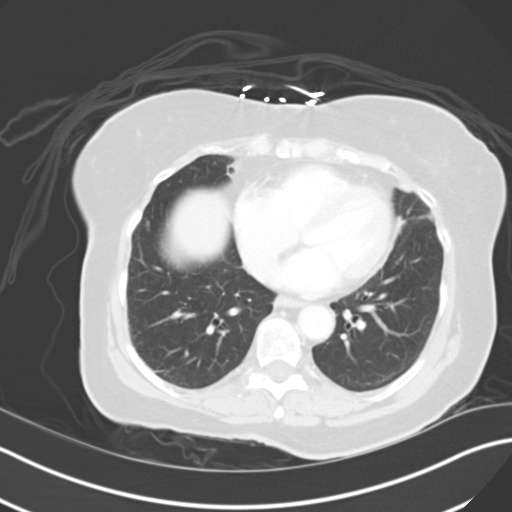

[13 of 32 positions shown; findings below may reference images not displayed]

FINDINGS: Mild peripheral areas of linear density within the lung bases. Lung
bases otherwise unremarkable.

Very vague area of low attenuation along the medial dome of the
liver extending beyond the liver border. Likely representing volume
averaging image 12 series 2. Liver otherwise negative.

The spleen, adrenals, pancreas, and kidneys are negative.

Calcified gallstones within the dependent portion gallbladder.

Bowel is negative.

No abdominal free fluid, loculated fluid collections, masses nor
adenopathy.

Small amount of free fluid within the pelvis consistent with recent
surgery. Otherwise no loculated fluid collections, masses, nor
adenopathy.

No abdominal aortic aneurysm. The celiac, SMA, IMA, portal vein, SMV
are opacified. There no aggressive appearing osseous lesions.
Multilevel spondylosis within the lumbar spine.

Small fat containing umbilical hernia.  No inguinal hernia.

Air within the nondependent portion of the urinary bladder likely
secondary to recent instrumentation. The urinary bladder otherwise
negative. No evidence of contrast extravasation.
IMPRESSION: No CT evidence of obstructive or inflammatory abnormalities.
Postsurgical changes are appreciated within the pelvis.

Atherosclerotic calcifications in the aorta.

Calcified gallstones in the gallbladder.

Minimal scarring versus atelectasis within the lung bases

## 2014-05-28 IMAGING — CR DG CHEST 2V
2 series · 2 of 2 positions shown · non-contrast
Comparison: [DATE]

CLINICAL DATA: Hysterectomy a week ago.

EXAM:
CHEST  2 VIEW

[w chest lat]
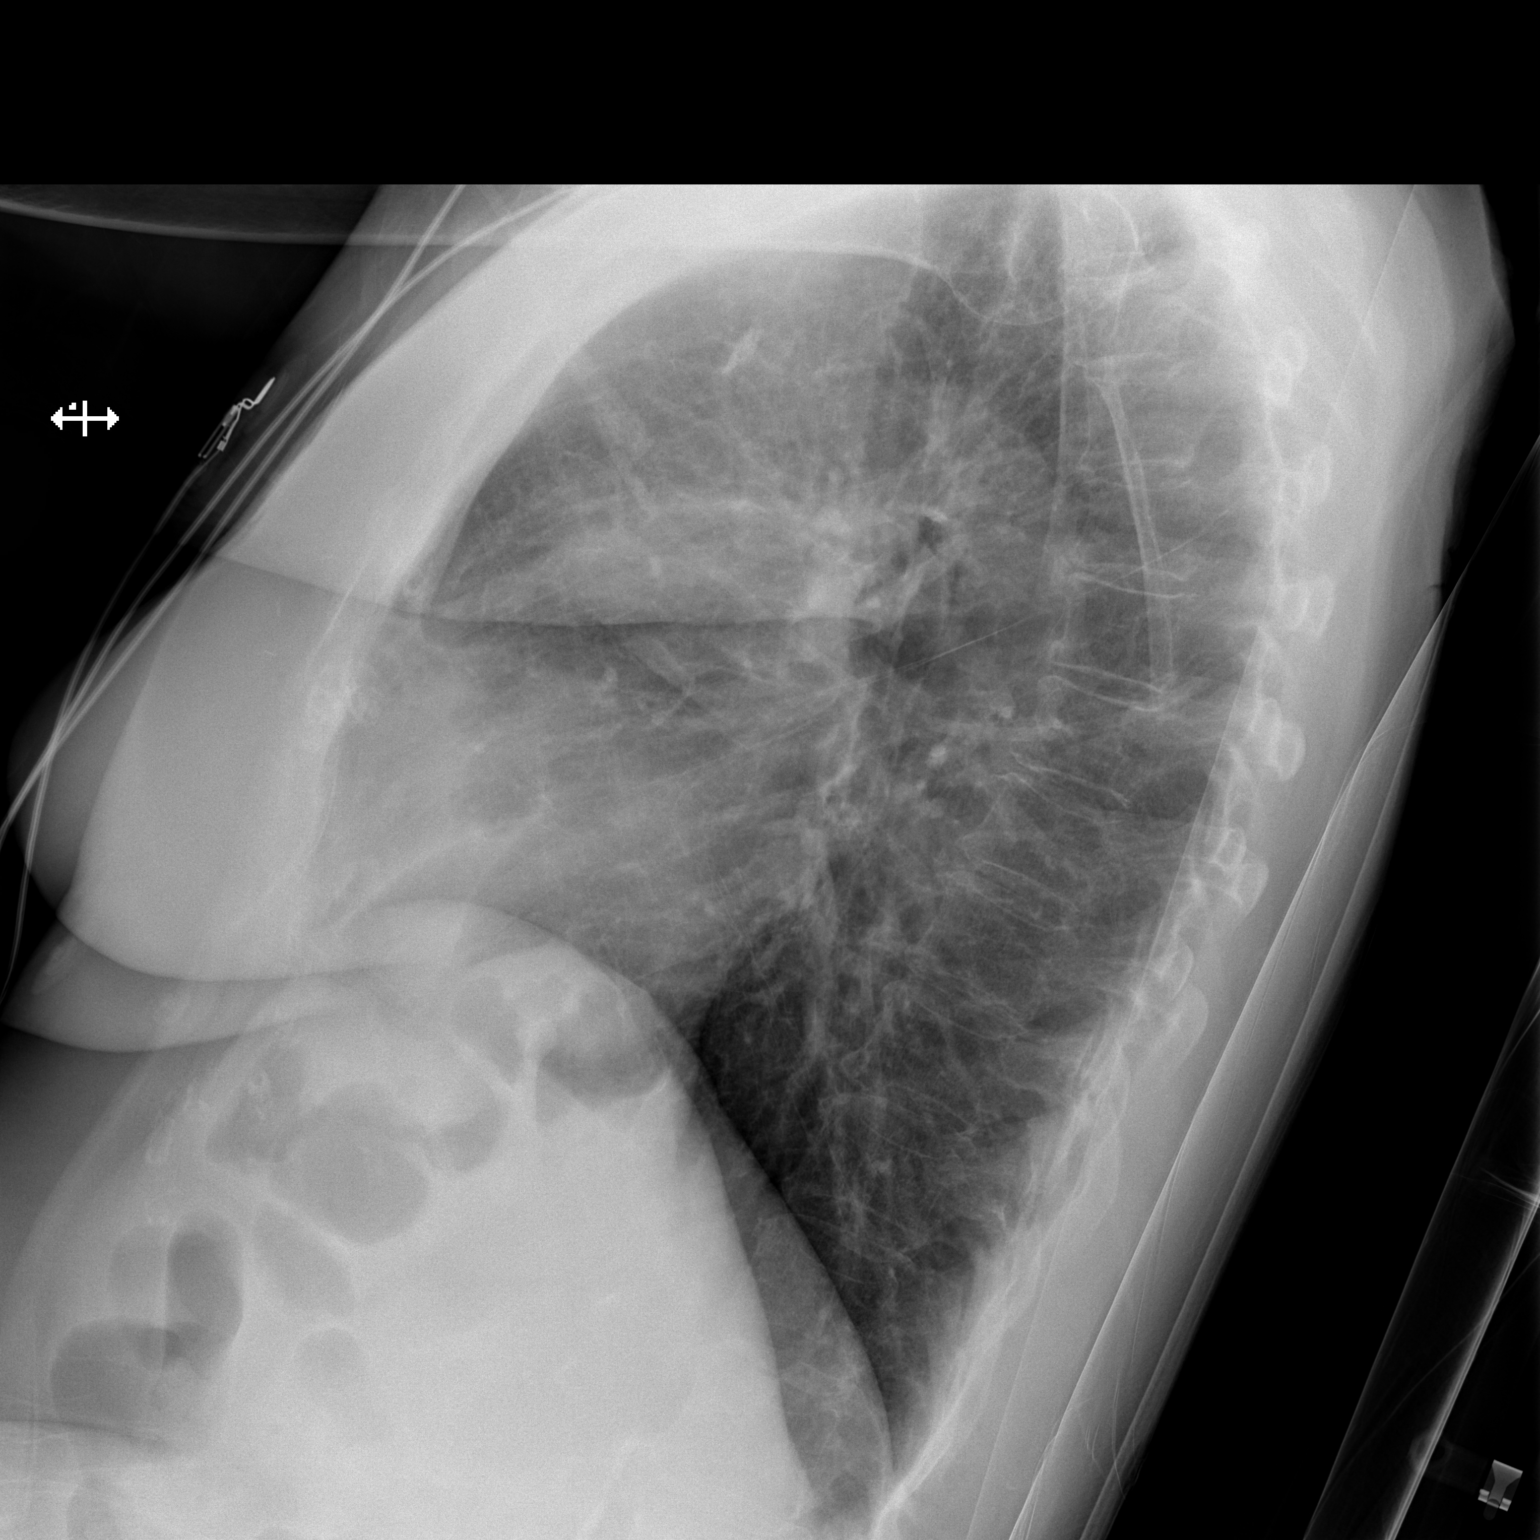

[x chest ap]
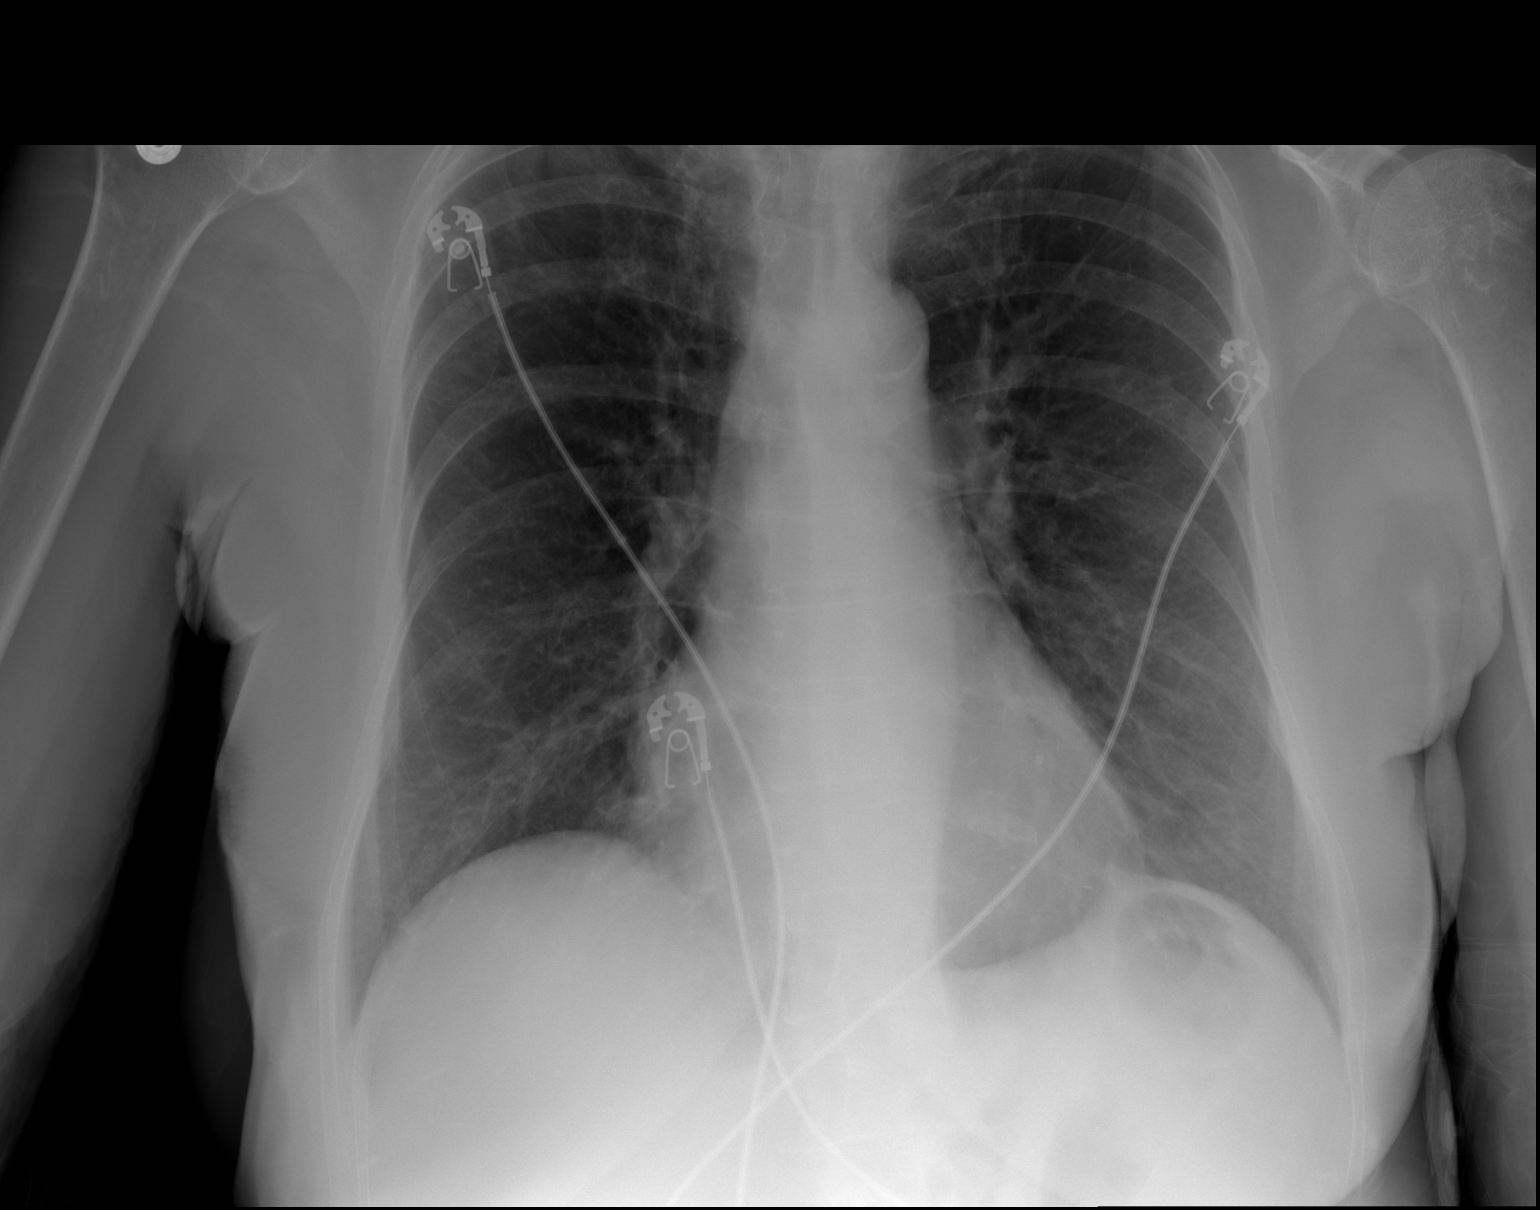

[2 of 2 positions shown; findings below may reference images not displayed]

FINDINGS: There is no focal parenchymal opacity, pleural effusion, or
pneumothorax. The heart and mediastinal contours are unremarkable.
There is thoracic aortic atherosclerosis.

The osseous structures are unremarkable.
IMPRESSION: No active cardiopulmonary disease.

## 2014-05-28 MED ORDER — SODIUM CHLORIDE 0.9 % IV SOLN
Freq: Once | INTRAVENOUS | Status: AC
  Administered 2014-05-28: 10:00:00 via INTRAVENOUS

## 2014-05-28 MED ORDER — CEFUROXIME AXETIL 250 MG PO TABS: 250.0000 mg | ORAL_TABLET | Freq: Two times a day (BID) | ORAL | Status: DC

## 2014-05-28 MED ORDER — DEXTROSE 5 % IV SOLN
1.0000 g | Freq: Once | INTRAVENOUS | Status: AC
  Administered 2014-05-28: 1 g via INTRAVENOUS
  Filled 2014-05-28: qty 10

## 2014-05-28 MED ORDER — IOHEXOL 300 MG/ML  SOLN
100.0000 mL | Freq: Once | INTRAMUSCULAR | Status: AC | PRN
  Administered 2014-05-28: 100 mL via INTRAVENOUS

## 2014-05-28 MED ORDER — ONDANSETRON HCL 4 MG PO TABS: 4.0000 mg | ORAL_TABLET | Freq: Four times a day (QID) | ORAL | Status: DC

## 2014-05-28 MED ORDER — ONDANSETRON HCL 4 MG/2ML IJ SOLN
4.0000 mg | Freq: Once | INTRAMUSCULAR | Status: AC
  Administered 2014-05-28: 4 mg via INTRAVENOUS
  Filled 2014-05-28: qty 2

## 2014-05-28 MED ORDER — METOCLOPRAMIDE HCL 5 MG/ML IJ SOLN
5.0000 mg | Freq: Once | INTRAMUSCULAR | Status: AC
  Administered 2014-05-28: 5 mg via INTRAVENOUS
  Filled 2014-05-28: qty 2

## 2014-05-28 MED ORDER — IOHEXOL 300 MG/ML  SOLN
50.0000 mL | Freq: Once | INTRAMUSCULAR | Status: AC | PRN
  Administered 2014-05-28: 50 mL via ORAL

## 2014-05-28 NOTE — Discharge Instructions (Signed)

## 2014-05-28 NOTE — ED Provider Notes (Signed)
CSN: [id number]     Arrival date & time 05/28/14  3536 History   First MD Initiated Contact with Patient 05/28/14 [phone]     Chief Complaint  Patient presents with  . Emesis  . Abdominal Pain     (Consider location/radiation/quality/duration/timing/severity/associated sxs/prior Treatment) HPI Comments: Patient presents to the ER for evaluation of fever, nausea, vomiting and abdominal pain. Patient had vaginal hysterectomy with bladder sling on June 23. Patient did have a Foley catheter in place at time of discharge secondary to complications. Catheter was removed 2 days ago. Yesterday she started having chills. Since then she has developed continued fever, chills, nausea, vomiting. Patient is complaining of pain in the lower abdomen area. Pain is constant, moderate. No alleviating or exacerbating factors.  Patient is a 80 y.o. female presenting with vomiting and abdominal pain.  Emesis Associated symptoms: abdominal pain and chills   Abdominal Pain Associated symptoms: chills, nausea and vomiting     Past Medical History  Diagnosis Date  . Proctitis     colonsocopy 2007, Canasa suppositories  . S/P endoscopy March 2009    mild erosive reflux esophagitis, Schatzki's ring, s/p dilation  . Schatzki's ring   . Colitis   . Arthritis   . Wears glasses   . Anxiety   . Depression   . Wears dentures     upper  . HOH (hard of hearing)   . Blind left eye   . Breast disorder     cancer left  . Cystocele 10/11/2013  . Pelvic relaxation 10/11/2013  . Shortness of breath     occ if anxiety  . GERD (gastroesophageal reflux disease)     occasional Tums only  . Cancer     left breast    Past Surgical History  Procedure Laterality Date  . Lumbar disc surgery  1993  . Retinal detachment surgery  1978    Left  . Colonoscopy  05/06/2006    Diffuse inflammatory changes of the rectal mucosa, consistent  with proctitis.  Otherwise, normal colon to terminal ileum  .  Esophagogastroduodenoscopy  02/09/2008    Schatzki ring status post dilation/Distal esophageal erosion consistent with mild erosive reflux  esophagitis, otherwise unremarkable esophagus, normal stomach, D1, D2.  . Eye surgery      lt cataract-implant  . Eye surgery      lt-lens repaced,l  . Breast lumpectomy with needle localization and axillary sentinel lymph node bx Left 05/03/2013    Procedure: BREAST LUMPECTOMY WITH NEEDLE LOCALIZATION AND AXILLARY SENTINEL LYMPH NODE BX;  Surgeon: Edward Jolly, MD;  Location: [facility];  Service: General;  Laterality: Left;  . Re-excision of breast lumpectomy Left 05/19/2013    Procedure: RE-EXCISION OF BREAST LUMPECTOMY;  Surgeon: Edward Jolly, MD;  Location: [facility];  Service: General;  Laterality: Left;  . Breast surgery Left 05/19/13  . Cardiac catheterization  1998  . Vaginal hysterectomy Bilateral 05/17/2014    Procedure: HYSTERECTOMY VAGINAL with Bilateral Salpingo-Oophorectomy; Bladder cystotomy repair;  Surgeon: Marvene Staff, MD;  Location: [facility];  Service: Gynecology;  Laterality: Bilateral;  . Cystoscopy N/A 05/17/2014    Procedure: CYSTOSCOPY;  Surgeon: Reece Packer, MD;  Location: [facility];  Service: Urology;  Laterality: N/A;  . Anterior and posterior repair N/A 05/17/2014    Procedure: ANTERIOR (CYSTOCELE) AND POSTERIOR REPAIR (RECTOCELE);  Surgeon: Reece Packer, MD;  Location: [facility];  Service: Urology;  Laterality: N/A;  . Vaginal prolapse repair N/A  05/17/2014    Procedure: VAGINAL VAULT PROLAPSE AND GRAFT;  Surgeon: Reece Packer, MD;  Location: [facility];  Service: Urology;  Laterality: N/A;   Family History  Problem Relation Age of Onset  . Colon cancer Neg Hx   . Cancer Brother     spinal   History  Substance Use Topics  . Smoking status: Former Smoker -- 1.00 packs/day    Types: Cigarettes    Quit date: 04/28/1990  . Smokeless tobacco: Never Used     Comment:  quit 19 yrs ago  . Alcohol Use: Yes     Comment: a glass of wine once or twice a week   OB History   Grav Para Term Preterm Abortions TAB SAB Ect Mult Living   6 6        5      Review of Systems  Constitutional: Positive for chills.  Gastrointestinal: Positive for nausea, vomiting and abdominal pain.  All other systems reviewed and are negative.     Allergies  Sulfonamide derivatives  Home Medications   Prior to Admission medications   Medication Sig Start Date End Date Taking? Authorizing Provider  acetaminophen (TYLENOL) 325 MG tablet Take 650 mg by mouth every 6 (six) hours as needed.     Yes Historical Provider, MD  clorazepate (TRANXENE) 7.5 MG tablet Take 7.5 mg by mouth 2 (two) times daily as needed for anxiety.   Yes Historical Provider, MD  ibuprofen (ADVIL,MOTRIN) 800 MG tablet Take 1 tablet (800 mg total) by mouth every 8 (eight) hours as needed (mild pain). 05/18/14  Yes Sheronette A Cousins, MD  LOTEMAX 0.5 % ophthalmic suspension Place 1 drop into both eyes 4 (four) times daily as needed.  05/07/11  Yes Historical Provider, MD  oxyCODONE-acetaminophen (PERCOCET/ROXICET) 5-325 MG per tablet Take 1-2 tablets by mouth every 4 (four) hours as needed for severe pain (moderate to severe pain (when tolerating fluids)). 05/18/14  Yes Sheronette Clint Bolder, MD  simethicone (MYLICON) 80 MG chewable tablet Chew 1 tablet (80 mg total) by mouth 4 (four) times daily as needed for flatulence. 05/18/14  Yes Sheronette A Cousins, MD   BP 132/61  Pulse 74  Temp(Src) 98.2 F (36.8 C) (Oral)  Resp 20  SpO2 93% Physical Exam  Constitutional: She is oriented to person, place, and time. She appears well-developed and well-nourished. No distress.  HENT:  Head: Normocephalic and atraumatic.  Right Ear: Hearing normal.  Left Ear: Hearing normal.  Nose: Nose normal.  Mouth/Throat: Oropharynx is clear and moist and mucous membranes are normal.  Eyes: Conjunctivae and EOM are normal. Pupils  are equal, round, and reactive to light.  Neck: Normal range of motion. Neck supple.  Cardiovascular: Regular rhythm, S1 normal and S2 normal.  Exam reveals no gallop and no friction rub.   No murmur heard. Pulmonary/Chest: Effort normal and breath sounds normal. No respiratory distress. She exhibits no tenderness.  Abdominal: Soft. Normal appearance and bowel sounds are normal. There is no hepatosplenomegaly. There is tenderness in the suprapubic area. There is no rebound, no guarding, no tenderness at McBurney's point and negative Murphy's sign. No hernia.  Musculoskeletal: Normal range of motion.  Neurological: She is alert and oriented to person, place, and time. She has normal strength. No cranial nerve deficit or sensory deficit. Coordination normal. GCS eye subscore is 4. GCS verbal subscore is 5. GCS motor subscore is 6.  Skin: Skin is warm, dry and intact. No rash noted. No cyanosis.  Psychiatric:  She has a normal mood and affect. Her speech is normal and behavior is normal. Thought content normal.    ED Course  Procedures (including critical care time) Labs Review Labs Reviewed  COMPREHENSIVE METABOLIC PANEL - Abnormal; Notable for the following:    Glucose, Bld 104 (*)    Albumin 3.4 (*)    GFR calc non Af Amer 78 (*)    All other components within normal limits  URINALYSIS, ROUTINE W REFLEX MICROSCOPIC - Abnormal; Notable for the following:    Color, Urine AMBER (*)    APPearance TURBID (*)    Hgb urine dipstick LARGE (*)    Protein, ur 100 (*)    Leukocytes, UA LARGE (*)    All other components within normal limits  URINE MICROSCOPIC-ADD ON - Abnormal; Notable for the following:    Squamous Epithelial / LPF FEW (*)    Bacteria, UA MANY (*)    All other components within normal limits  I-STAT CHEM 8, ED - Abnormal; Notable for the following:    Glucose, Bld 100 (*)    All other components within normal limits  CULTURE, BLOOD (ROUTINE X 2)  CULTURE, BLOOD (ROUTINE X 2)   URINE CULTURE  CBC WITH DIFFERENTIAL  I-STAT CG4 LACTIC ACID, ED    Imaging Review Dg Chest 2 View  05/28/2014   CLINICAL DATA:  Hysterectomy a week ago.  EXAM: CHEST  2 VIEW  COMPARISON:  08/07/2011  FINDINGS: There is no focal parenchymal opacity, pleural effusion, or pneumothorax. The heart and mediastinal contours are unremarkable. There is thoracic aortic atherosclerosis.  The osseous structures are unremarkable.  IMPRESSION: No active cardiopulmonary disease.   Electronically Signed   By: Kathreen Devoid   On: 05/28/2014 10:30   Ct Abdomen Pelvis W Contrast  05/28/2014   CLINICAL DATA:  Vaginal hysterectomy 6/24 ABDOMINAL PAIN, FEVER  EXAM: CT ABDOMEN AND PELVIS WITH CONTRAST  TECHNIQUE: Multidetector CT imaging of the abdomen and pelvis was performed using the standard protocol following bolus administration of intravenous contrast.  CONTRAST:  25mL OMNIPAQUE IOHEXOL 300 MG/ML SOLN, 148mL OMNIPAQUE IOHEXOL 300 MG/ML SOLN  COMPARISON:  None.  FINDINGS: Mild peripheral areas of linear density within the lung bases. Lung bases otherwise unremarkable.  Very vague area of low attenuation along the medial dome of the liver extending beyond the liver border. Likely representing volume averaging image 12 series 2. Liver otherwise negative.  The spleen, adrenals, pancreas, and kidneys are negative.  Calcified gallstones within the dependent portion gallbladder.  Bowel is negative.  No abdominal free fluid, loculated fluid collections, masses nor adenopathy.  Small amount of free fluid within the pelvis consistent with recent surgery. Otherwise no loculated fluid collections, masses, nor adenopathy.  No abdominal aortic aneurysm. The celiac, SMA, IMA, portal vein, SMV are opacified. There no aggressive appearing osseous lesions. Multilevel spondylosis within the lumbar spine.  Small fat containing umbilical hernia.  No inguinal hernia.  Air within the nondependent portion of the urinary bladder likely secondary  to recent instrumentation. The urinary bladder otherwise negative. No evidence of contrast extravasation.  IMPRESSION: No CT evidence of obstructive or inflammatory abnormalities. Postsurgical changes are appreciated within the pelvis.  Atherosclerotic calcifications in the aorta.  Calcified gallstones in the gallbladder.  Minimal scarring versus atelectasis within the lung bases   Electronically Signed   By: Margaree Mackintosh M.D.   On: 05/28/2014 12:45     EKG Interpretation   Date/Time:  Saturday May 28 2014 10:11:42 EDT  Ventricular Rate:  79 PR Interval:  136 QRS Duration: 88 QT Interval:  407 QTC Calculation: 467 R Axis:   46 Text Interpretation:  Normal sinus rhythm Normal ECG Confirmed by Nautia Lem   MD, [place]autel ([id number]) on 05/28/2014 10:23:17 AM      MDM   Final diagnoses:  None  UTI  Patient presents to the ER for evaluation of fever, chills and suprapubic pain. Patient underwent vaginal hysterectomy on June 24. She had complications of the surgery including cystotomy repair. Patient went home with a Foley catheter which was removed 2 days ago. Since removing the catheter, she has developed a fever, chills and suprapubic discomfort. She reports that she has also noticed a foul odor to her urine. Abdominal exam did not have any signs of peritonitis. Blood work was entirely normal. Urinalysis shows obvious signs of infection. Urine culture pending, patient administered Rocephin.  CT scan performed to evaluate for postoperative complications. No acute findings were present.  Patient administered Rocephin. Case discussed briefly with Doctor Junious Silk, on-call for urology. He agreed with IV antibiotics in the ER, home with by mouth antibiotics. The patient has followup scheduled for Monday morning already. Patient was counseled to return to the ER for worsening symptoms, especially if she cannot hold down her antibiotics. Will prescribe Ceftin and Zofran.  Orpah Greek,  MD 05/28/14 [phone]

## 2014-05-28 NOTE — ED Notes (Signed)
Pt reports hysterectomy and bladder tuck on 6/23. Catheter was placed at that time due to nicking the bladder. Catheter removed Thursday. Reports chills and cold sweats since Friday with lower abd pain. Has thrown up and unable to keep anything down.

## 2014-05-29 LAB — URINE CULTURE: Colony Count: 50000

## 2014-05-30 DIAGNOSIS — N3941 Urge incontinence: Secondary | ICD-10-CM | POA: Diagnosis not present

## 2014-06-02 DIAGNOSIS — N390 Urinary tract infection, site not specified: Secondary | ICD-10-CM | POA: Diagnosis not present

## 2014-06-03 LAB — CULTURE, BLOOD (ROUTINE X 2)
Culture: NO GROWTH
Culture: NO GROWTH

## 2014-06-06 ENCOUNTER — Other Ambulatory Visit: Payer: Self-pay | Admitting: Internal Medicine

## 2014-06-06 DIAGNOSIS — Z853 Personal history of malignant neoplasm of breast: Secondary | ICD-10-CM

## 2014-06-06 DIAGNOSIS — Z9889 Other specified postprocedural states: Secondary | ICD-10-CM

## 2014-06-07 DIAGNOSIS — N898 Other specified noninflammatory disorders of vagina: Secondary | ICD-10-CM | POA: Diagnosis not present

## 2014-06-16 DIAGNOSIS — N8111 Cystocele, midline: Secondary | ICD-10-CM | POA: Diagnosis not present

## 2014-06-16 DIAGNOSIS — N3941 Urge incontinence: Secondary | ICD-10-CM | POA: Diagnosis not present

## 2014-06-21 ENCOUNTER — Other Ambulatory Visit: Payer: Self-pay | Admitting: Internal Medicine

## 2014-06-21 ENCOUNTER — Ambulatory Visit
Admission: RE | Admit: 2014-06-21 | Discharge: 2014-06-21 | Disposition: A | Discharge status: Home / Self Care | Payer: Medicare Other | Source: Ambulatory Visit | Attending: Internal Medicine | Admitting: Internal Medicine

## 2014-06-21 DIAGNOSIS — Z853 Personal history of malignant neoplasm of breast: Secondary | ICD-10-CM

## 2014-06-21 DIAGNOSIS — Z9889 Other specified postprocedural states: Secondary | ICD-10-CM

## 2014-06-21 DIAGNOSIS — N6489 Other specified disorders of breast: Secondary | ICD-10-CM | POA: Diagnosis not present

## 2014-06-21 IMAGING — MG MM DIAG BREAST TOMO BILATERAL
9 of 13 series · 9 of 29 positions shown · non-contrast
Comparison: Priors

CLINICAL DATA: History of left lumpectomy for breast cancer [Y6]

EXAM:
DIGITAL DIAGNOSTIC BILATERAL MAMMOGRAM WITH 3D TOMOSYNTHESIS AND CAD

[L TAN]
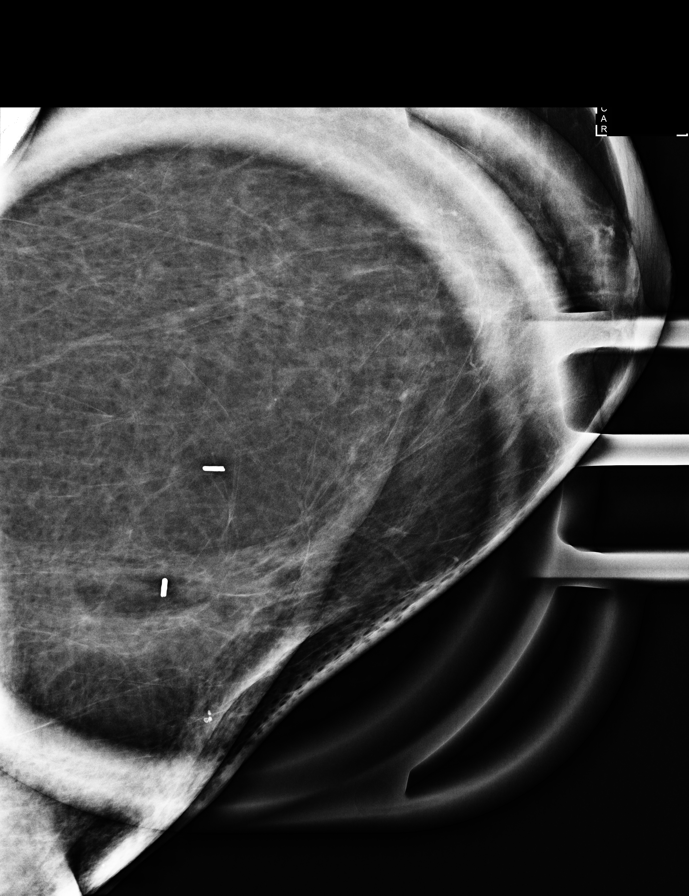

[L CC synth-2D]
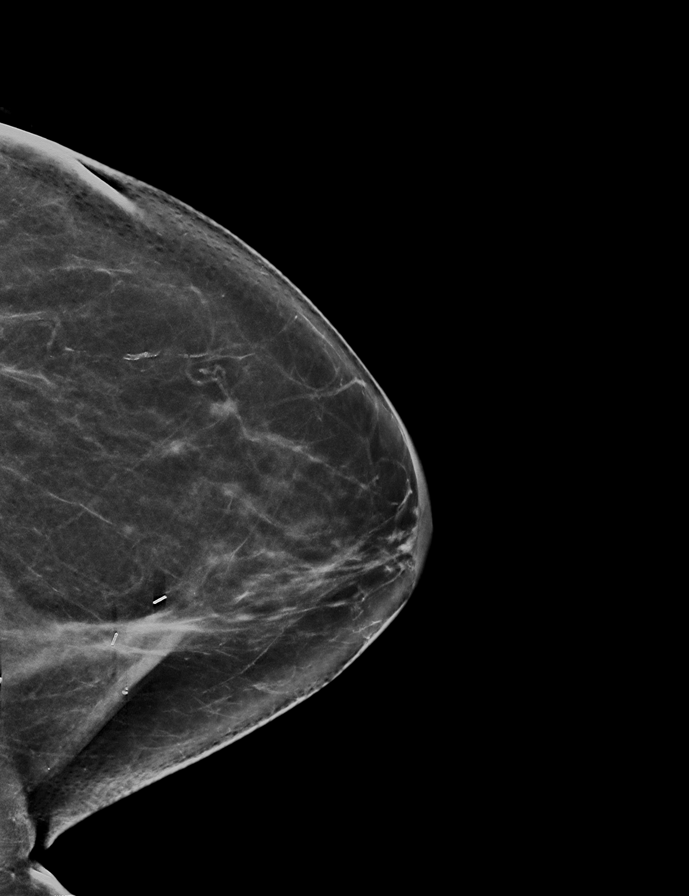

[L MLO]
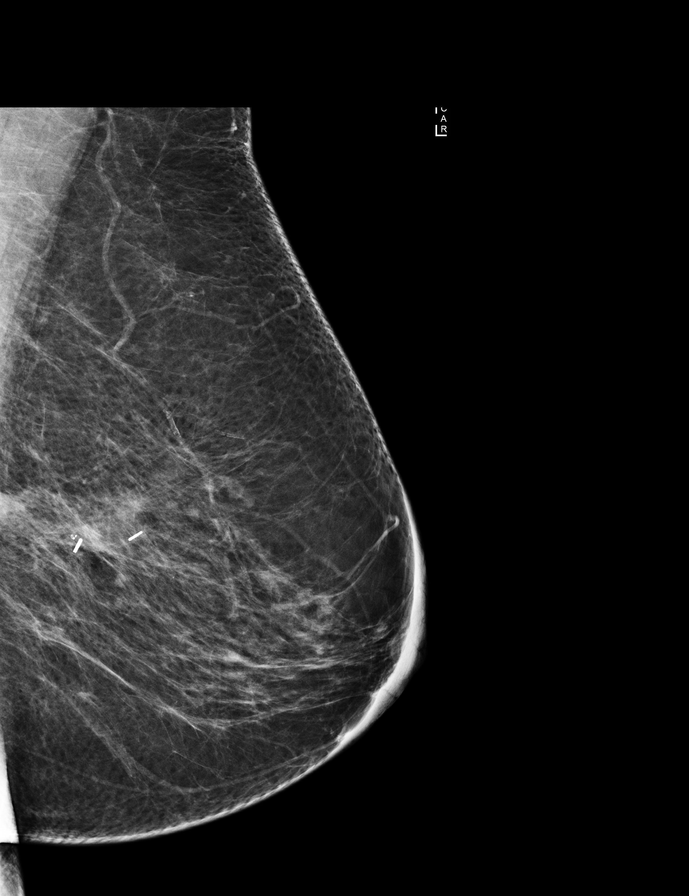

[R CC synth-2D]
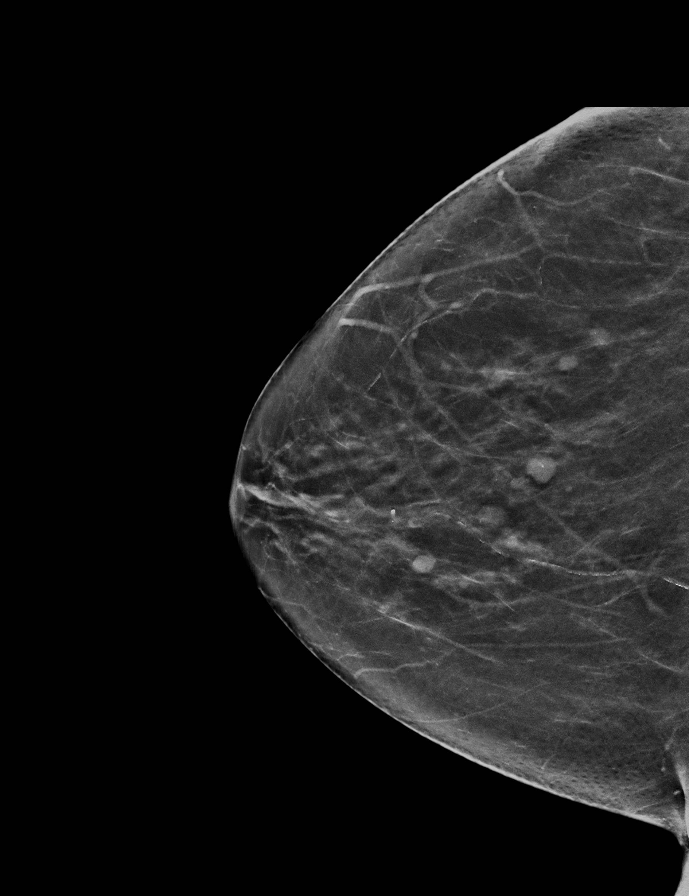

[L CC]
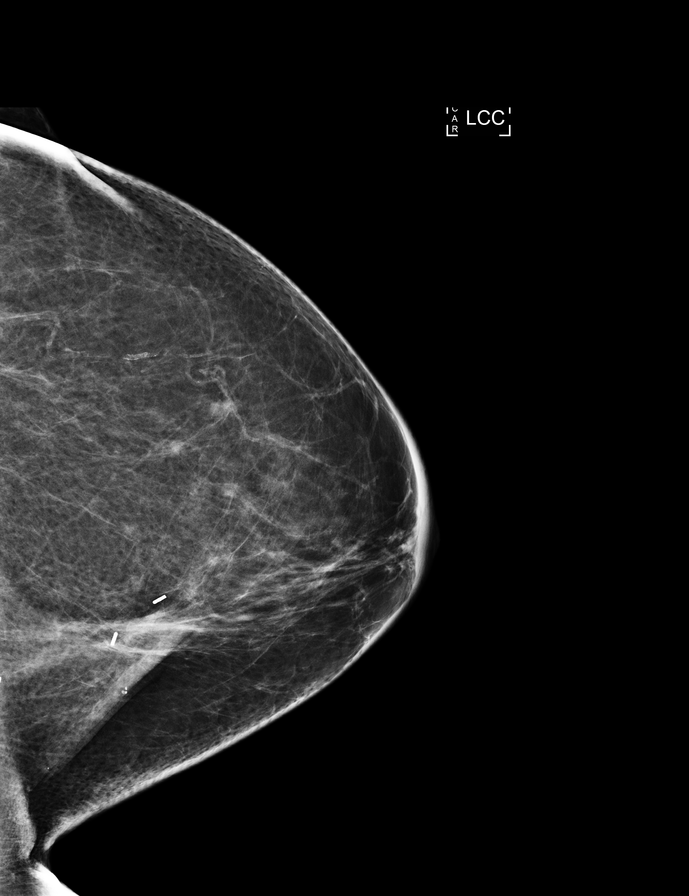

[R MLO]
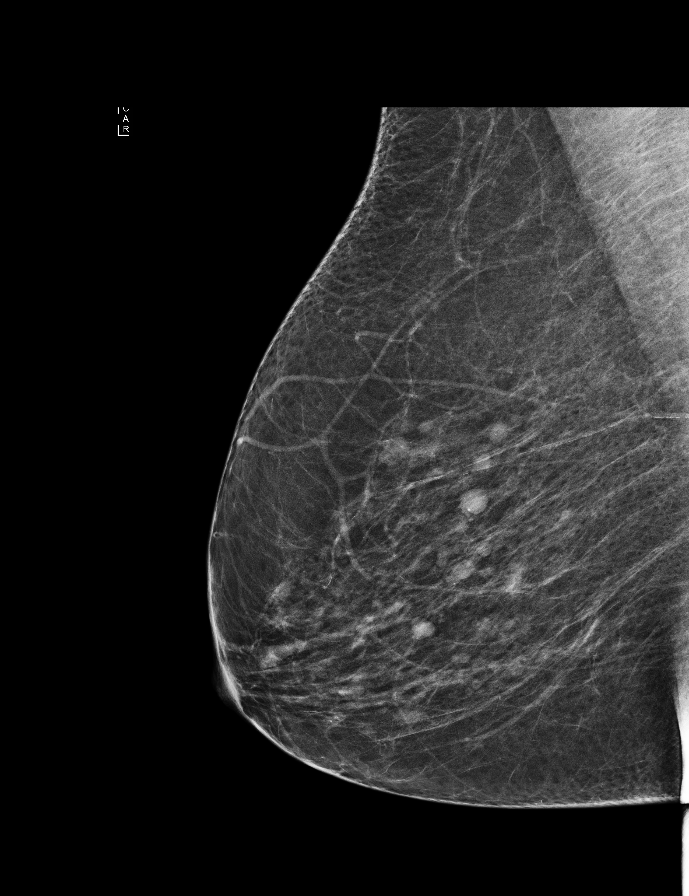

[L MLO synth-2D]
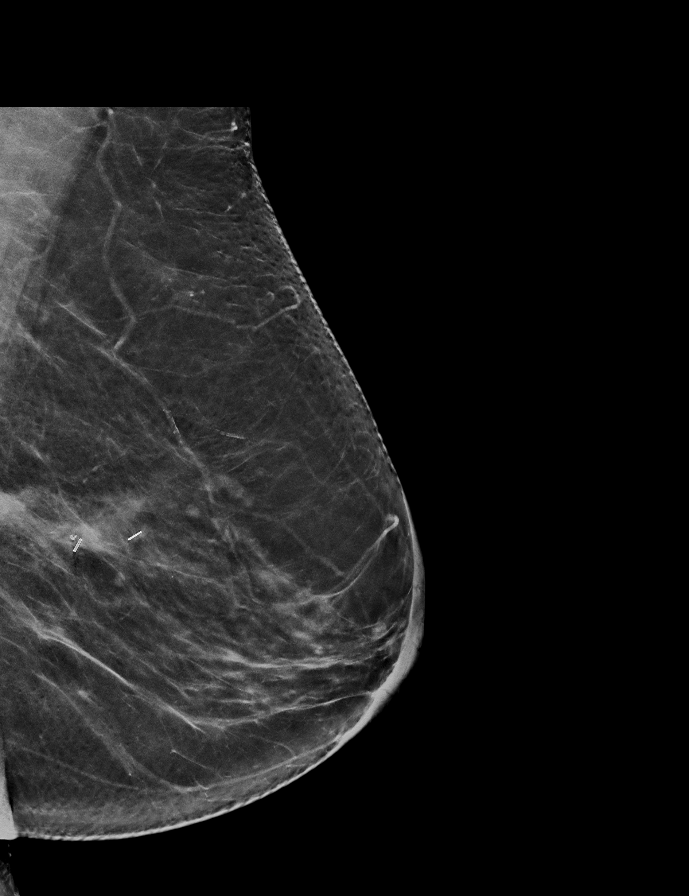

[R CC]
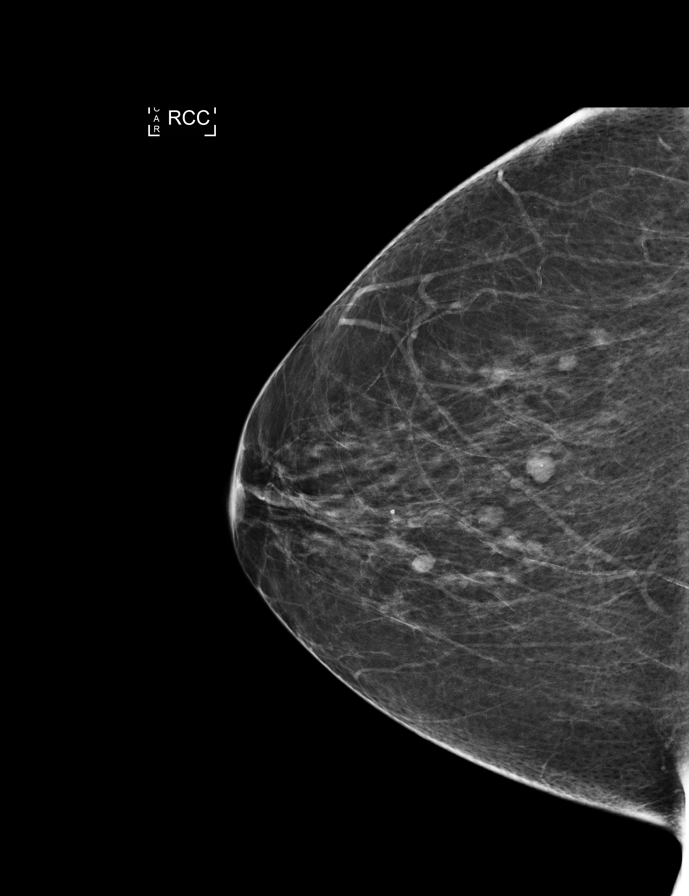

[R MLO synth-2D]
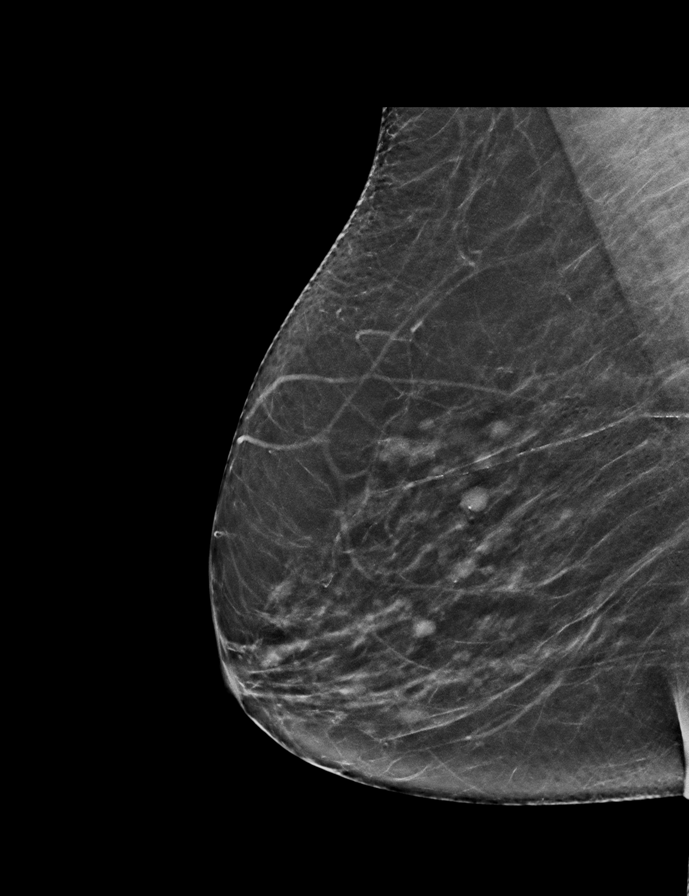

[9 of 29 positions shown; findings below may reference images not displayed]

ACR Breast Density Category b: There are scattered areas of
fibroglandular density.
FINDINGS: Left lumpectomy changes are identified. Waxing and waning oval and
round masses are again noted bilaterally, most compatible with cysts
or other benign findings. No new suspicious finding is seen in
either breast.

Mammographic images were processed with CAD.
IMPRESSION: No evidence for malignancy in either breast. Left lumpectomy changes
are reidentified.

RECOMMENDATION:
Diagnostic mammogram is suggested in 1 year. (Code:[Y6])

I have discussed the findings and recommendations with the patient.
Results were also provided in writing at the conclusion of the
visit. If applicable, a reminder letter will be sent to the patient
regarding the next appointment.

BI-RADS CATEGORY  2: Benign.

## 2014-07-05 DIAGNOSIS — Z09 Encounter for follow-up examination after completed treatment for conditions other than malignant neoplasm: Secondary | ICD-10-CM | POA: Diagnosis not present

## 2014-07-14 DIAGNOSIS — N3941 Urge incontinence: Secondary | ICD-10-CM | POA: Diagnosis not present

## 2014-07-14 DIAGNOSIS — N8111 Cystocele, midline: Secondary | ICD-10-CM | POA: Diagnosis not present

## 2014-08-31 ENCOUNTER — Ambulatory Visit (INDEPENDENT_AMBULATORY_CARE_PROVIDER_SITE_OTHER): Payer: Medicare Other | Admitting: General Surgery

## 2014-09-05 DIAGNOSIS — C50912 Malignant neoplasm of unspecified site of left female breast: Secondary | ICD-10-CM | POA: Diagnosis not present

## 2014-09-05 DIAGNOSIS — F419 Anxiety disorder, unspecified: Secondary | ICD-10-CM | POA: Diagnosis not present

## 2014-09-12 ENCOUNTER — Other Ambulatory Visit (HOSPITAL_COMMUNITY): Payer: Self-pay | Admitting: Internal Medicine

## 2014-09-12 ENCOUNTER — Ambulatory Visit (HOSPITAL_COMMUNITY)
Admission: RE | Admit: 2014-09-12 | Discharge: 2014-09-12 | Disposition: A | Discharge status: Home / Self Care | Payer: Medicare Other | Source: Ambulatory Visit | Attending: Internal Medicine | Admitting: Internal Medicine

## 2014-09-12 DIAGNOSIS — R071 Chest pain on breathing: Secondary | ICD-10-CM | POA: Insufficient documentation

## 2014-09-12 DIAGNOSIS — R079 Chest pain, unspecified: Secondary | ICD-10-CM | POA: Diagnosis not present

## 2014-09-12 IMAGING — CR DG CHEST 2V
2 series · 2 of 2 positions shown · non-contrast
Comparison: [DATE]

CLINICAL DATA: Chest pain for 1 week.

EXAM:
CHEST  2 VIEW

[view not recorded (1 of 2)]
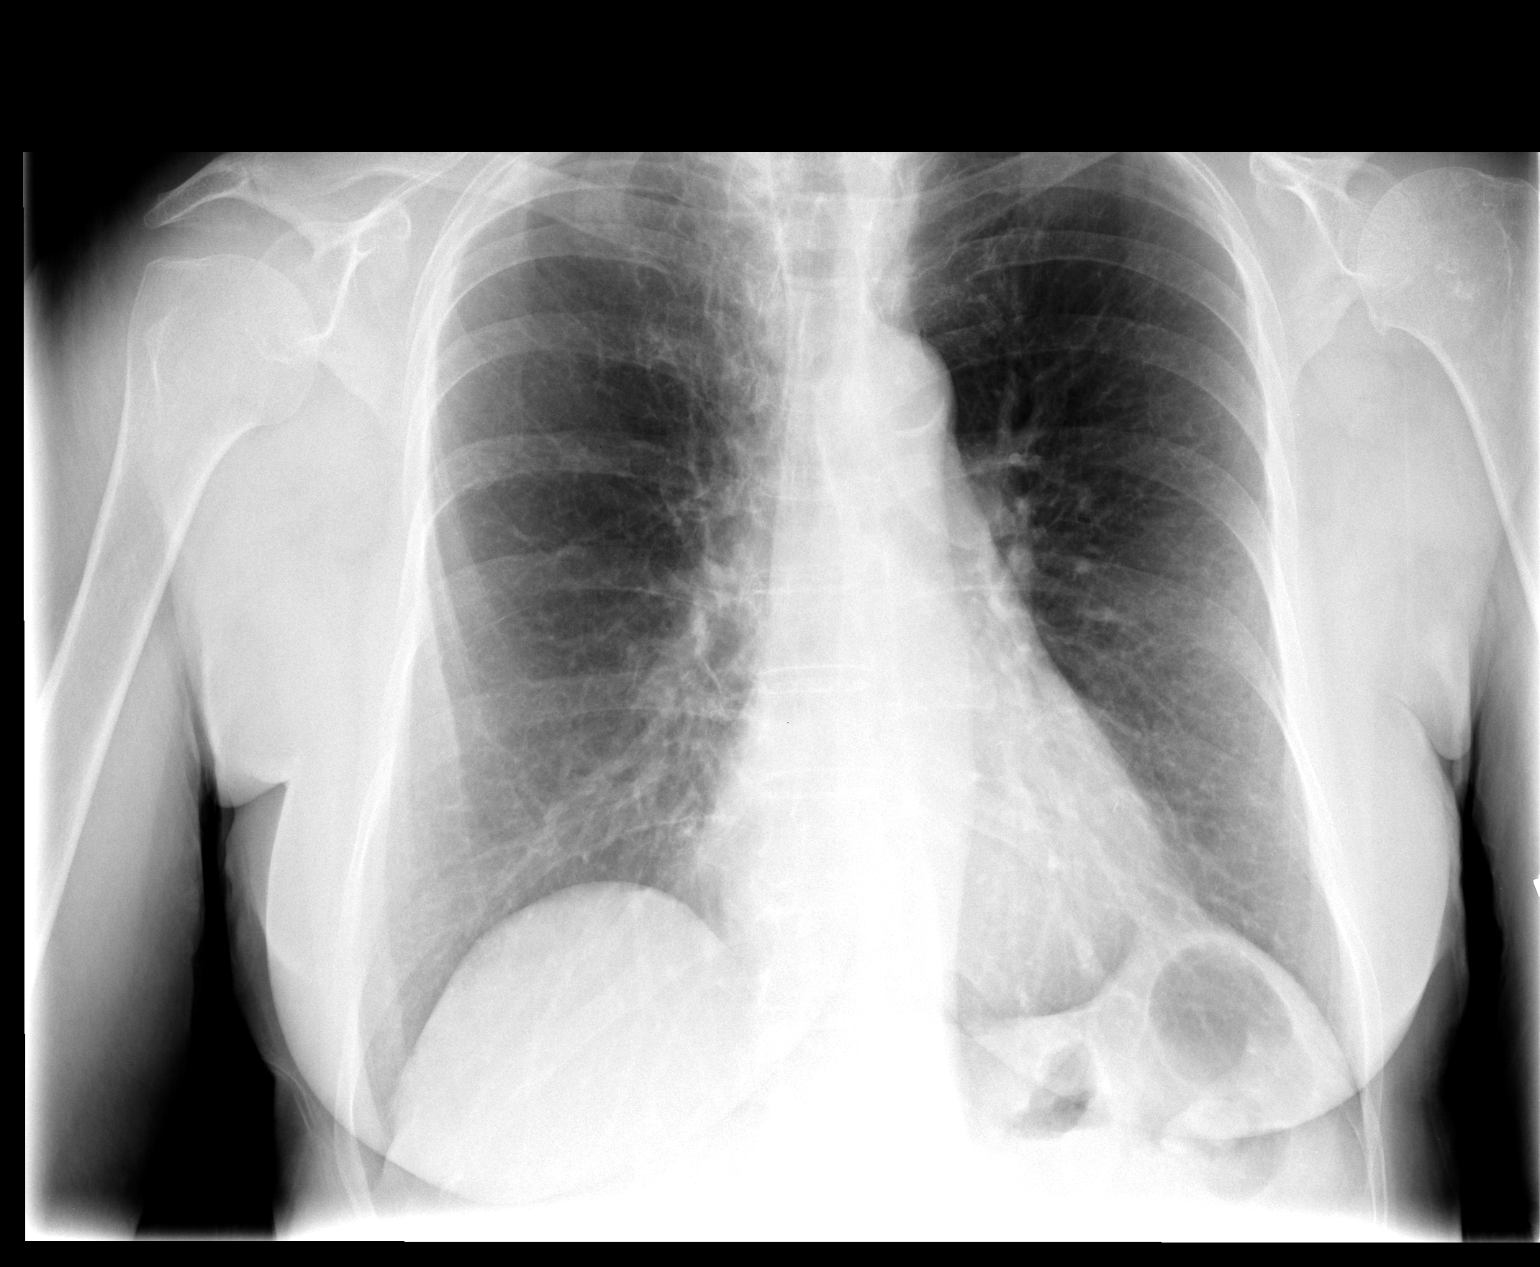

[view not recorded (2 of 2)]
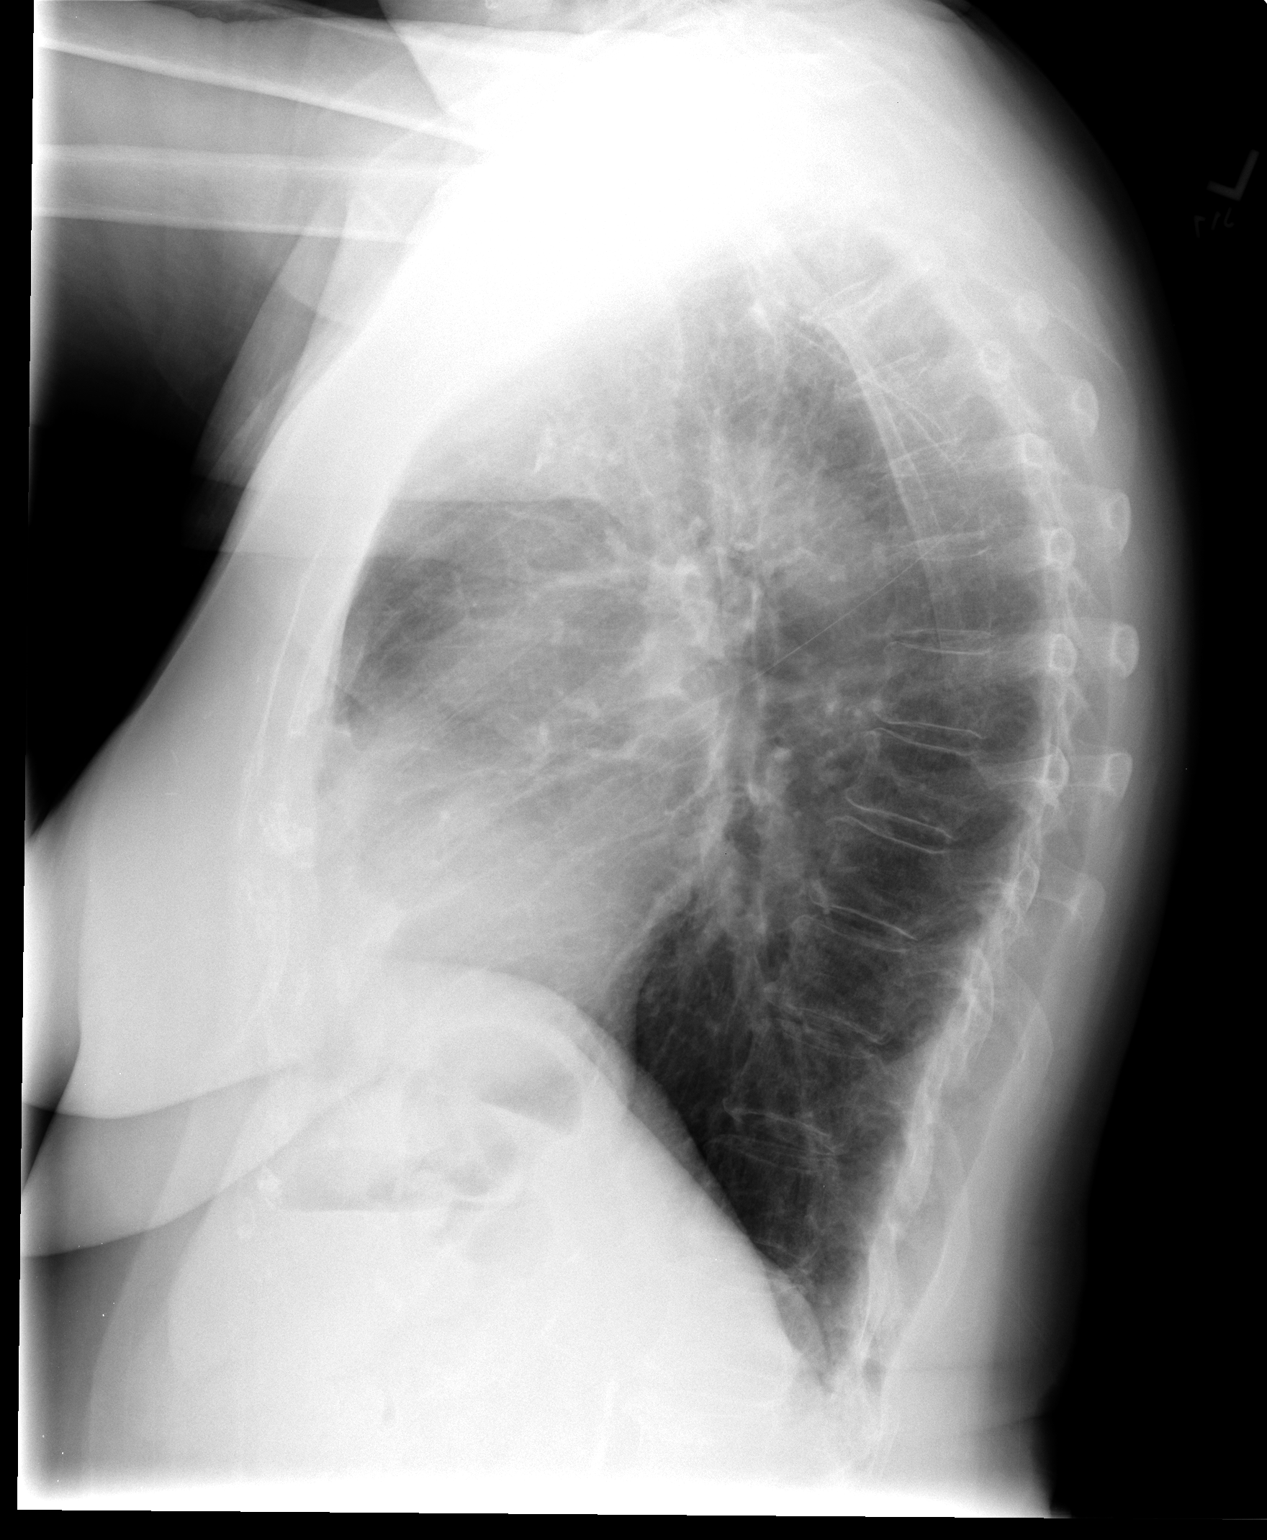

[2 of 2 positions shown; findings below may reference images not displayed]

FINDINGS: The heart size and mediastinal contours are within normal limits.
There is no focal infiltrate, pulmonary edema, or pleural effusion.
The visualized skeletal structures are unremarkable.
IMPRESSION: No active cardiopulmonary disease.

## 2014-09-26 ENCOUNTER — Encounter (HOSPITAL_COMMUNITY): Payer: Self-pay | Admitting: Emergency Medicine

## 2014-11-29 DIAGNOSIS — H04129 Dry eye syndrome of unspecified lacrimal gland: Secondary | ICD-10-CM | POA: Diagnosis not present

## 2014-12-28 DIAGNOSIS — C50919 Malignant neoplasm of unspecified site of unspecified female breast: Secondary | ICD-10-CM | POA: Diagnosis not present

## 2015-01-13 DIAGNOSIS — H524 Presbyopia: Secondary | ICD-10-CM | POA: Diagnosis not present

## 2015-01-13 DIAGNOSIS — H40013 Open angle with borderline findings, low risk, bilateral: Secondary | ICD-10-CM | POA: Diagnosis not present

## 2015-01-13 DIAGNOSIS — H1851 Endothelial corneal dystrophy: Secondary | ICD-10-CM | POA: Diagnosis not present

## 2015-01-16 DIAGNOSIS — J069 Acute upper respiratory infection, unspecified: Secondary | ICD-10-CM | POA: Diagnosis not present

## 2015-01-23 DIAGNOSIS — K219 Gastro-esophageal reflux disease without esophagitis: Secondary | ICD-10-CM | POA: Diagnosis not present

## 2015-01-23 DIAGNOSIS — R5381 Other malaise: Secondary | ICD-10-CM | POA: Diagnosis not present

## 2015-01-23 DIAGNOSIS — Z79899 Other long term (current) drug therapy: Secondary | ICD-10-CM | POA: Diagnosis not present

## 2015-01-25 DIAGNOSIS — E785 Hyperlipidemia, unspecified: Secondary | ICD-10-CM | POA: Diagnosis not present

## 2015-01-25 DIAGNOSIS — F419 Anxiety disorder, unspecified: Secondary | ICD-10-CM | POA: Diagnosis not present

## 2015-03-20 DIAGNOSIS — R197 Diarrhea, unspecified: Secondary | ICD-10-CM | POA: Diagnosis not present

## 2015-04-04 DIAGNOSIS — R197 Diarrhea, unspecified: Secondary | ICD-10-CM | POA: Diagnosis not present

## 2015-04-27 DIAGNOSIS — R197 Diarrhea, unspecified: Secondary | ICD-10-CM | POA: Diagnosis not present

## 2015-06-05 ENCOUNTER — Other Ambulatory Visit (HOSPITAL_COMMUNITY): Payer: Self-pay | Admitting: Internal Medicine

## 2015-06-05 DIAGNOSIS — R1011 Right upper quadrant pain: Secondary | ICD-10-CM

## 2015-06-08 ENCOUNTER — Other Ambulatory Visit (HOSPITAL_COMMUNITY): Payer: 59

## 2015-06-13 ENCOUNTER — Ambulatory Visit (HOSPITAL_COMMUNITY)
Admission: RE | Admit: 2015-06-13 | Discharge: 2015-06-13 | Disposition: A | Discharge status: Home / Self Care | Payer: Medicare Other | Source: Ambulatory Visit | Attending: Internal Medicine | Admitting: Internal Medicine

## 2015-06-13 DIAGNOSIS — K8000 Calculus of gallbladder with acute cholecystitis without obstruction: Secondary | ICD-10-CM | POA: Diagnosis not present

## 2015-06-13 DIAGNOSIS — K8020 Calculus of gallbladder without cholecystitis without obstruction: Secondary | ICD-10-CM | POA: Insufficient documentation

## 2015-06-13 DIAGNOSIS — R1011 Right upper quadrant pain: Secondary | ICD-10-CM | POA: Insufficient documentation

## 2015-06-22 DIAGNOSIS — K8010 Calculus of gallbladder with chronic cholecystitis without obstruction: Secondary | ICD-10-CM | POA: Diagnosis not present

## 2015-07-01 DIAGNOSIS — Z01812 Encounter for preprocedural laboratory examination: Secondary | ICD-10-CM | POA: Diagnosis not present

## 2015-07-01 DIAGNOSIS — K8020 Calculus of gallbladder without cholecystitis without obstruction: Secondary | ICD-10-CM | POA: Diagnosis not present

## 2015-07-12 ENCOUNTER — Other Ambulatory Visit: Payer: Self-pay | Admitting: General Surgery

## 2015-07-12 DIAGNOSIS — K8012 Calculus of gallbladder with acute and chronic cholecystitis without obstruction: Secondary | ICD-10-CM | POA: Diagnosis not present

## 2015-07-12 DIAGNOSIS — K8010 Calculus of gallbladder with chronic cholecystitis without obstruction: Secondary | ICD-10-CM | POA: Diagnosis not present

## 2015-08-01 DIAGNOSIS — Z23 Encounter for immunization: Secondary | ICD-10-CM | POA: Diagnosis not present

## 2015-08-01 DIAGNOSIS — F419 Anxiety disorder, unspecified: Secondary | ICD-10-CM | POA: Diagnosis not present

## 2015-08-01 DIAGNOSIS — K819 Cholecystitis, unspecified: Secondary | ICD-10-CM | POA: Diagnosis not present

## 2015-08-01 DIAGNOSIS — Z6826 Body mass index (BMI) 26.0-26.9, adult: Secondary | ICD-10-CM | POA: Diagnosis not present

## 2015-08-12 IMAGING — US US ABDOMEN LIMITED
1 series · 14 of 25 positions shown · non-contrast
Comparison: CT abdomen [DATE]

CLINICAL DATA: Right upper quadrant pain

EXAM:
US ABDOMEN LIMITED - RIGHT UPPER QUADRANT

[Series 1: us abdomen limited · 0.17mm/px · 14 of 45 slices shown]
[im 1/45]
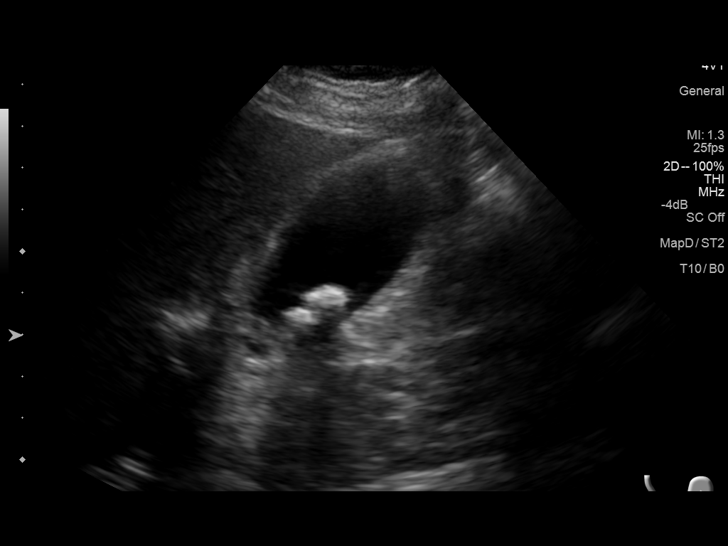
[im 4/45]
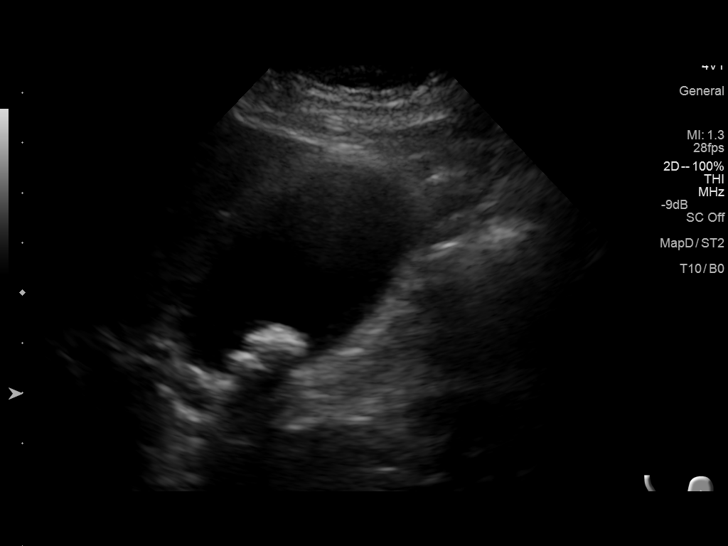
[im 8/45]
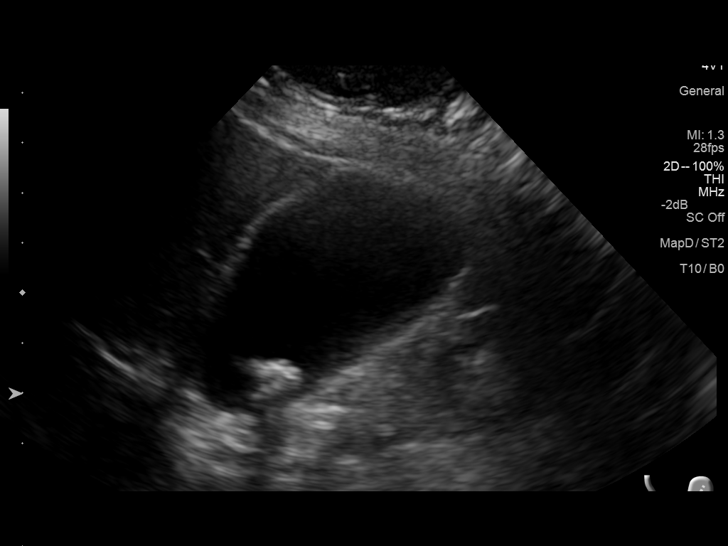
[im 12/45]
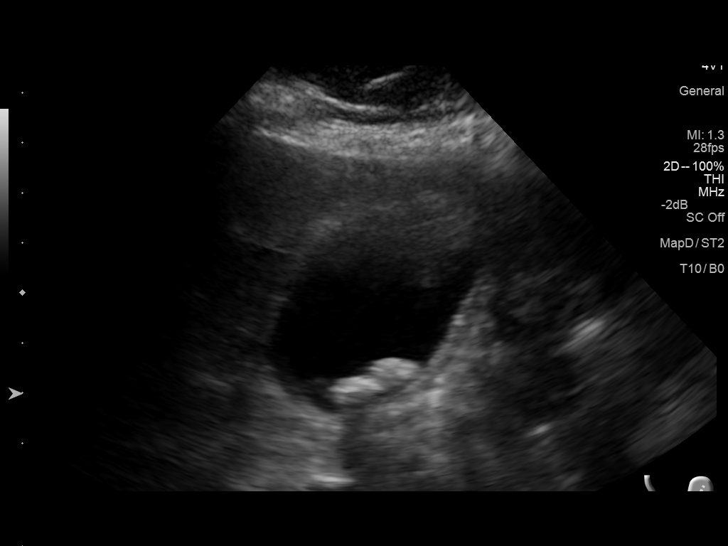
[im 15/45]
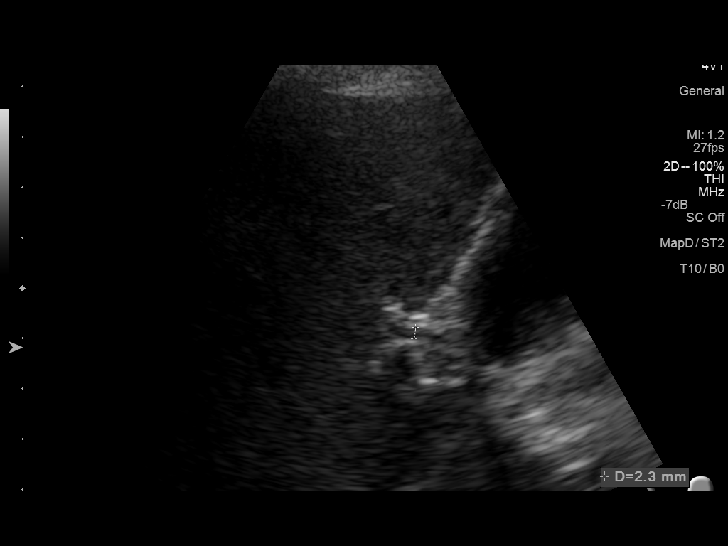
[im 17/45]
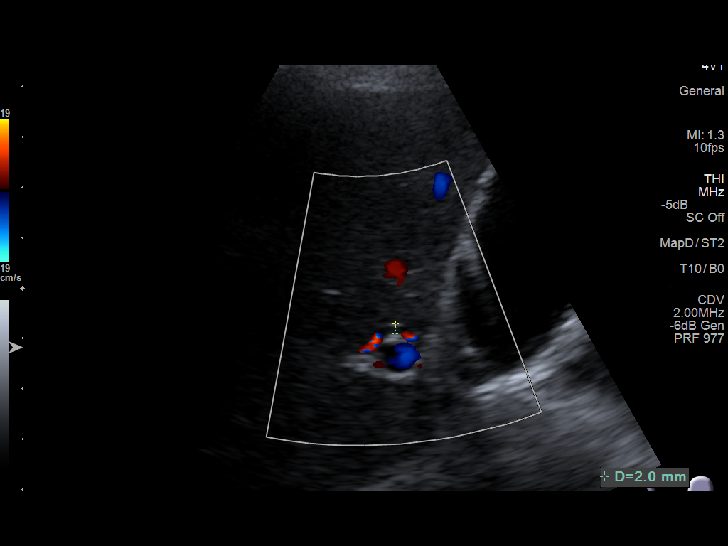
[im 21/45]
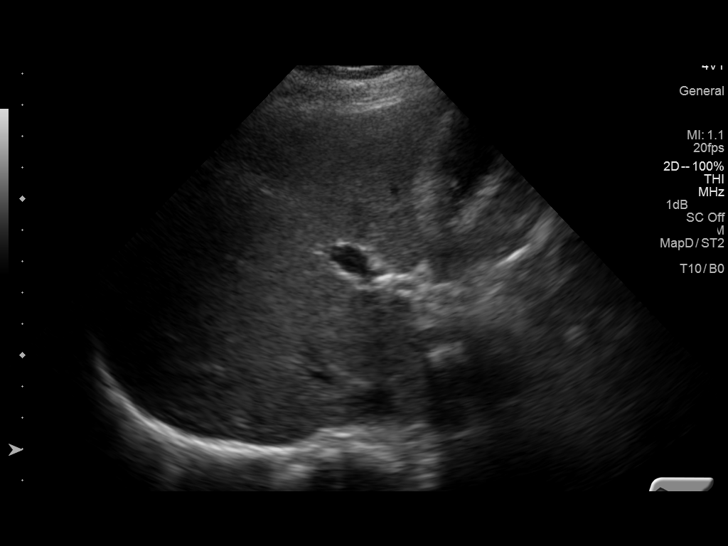
[im 24/45]
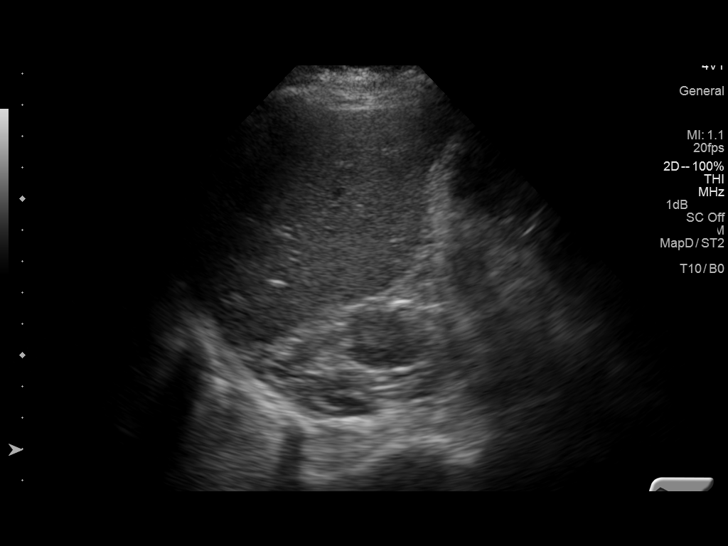
[im 28/45]
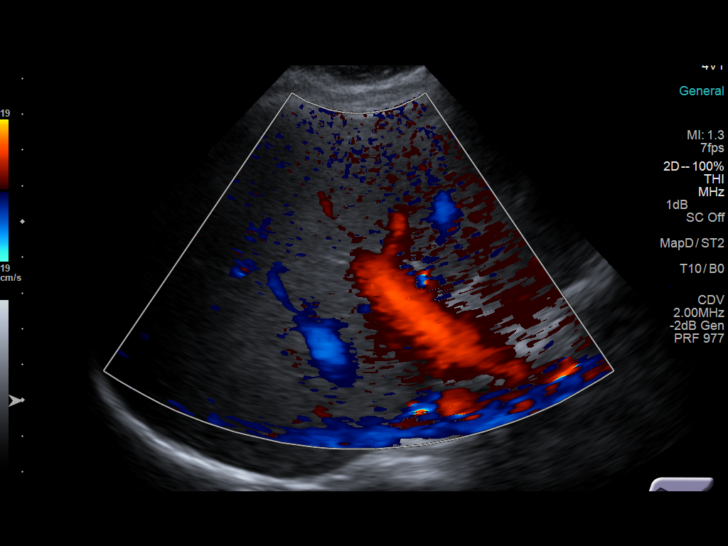
[im 30/45]
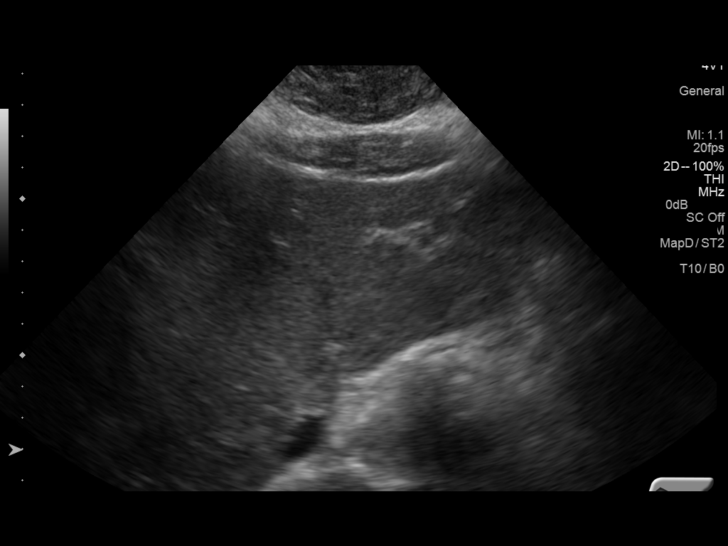
[im 34/45]
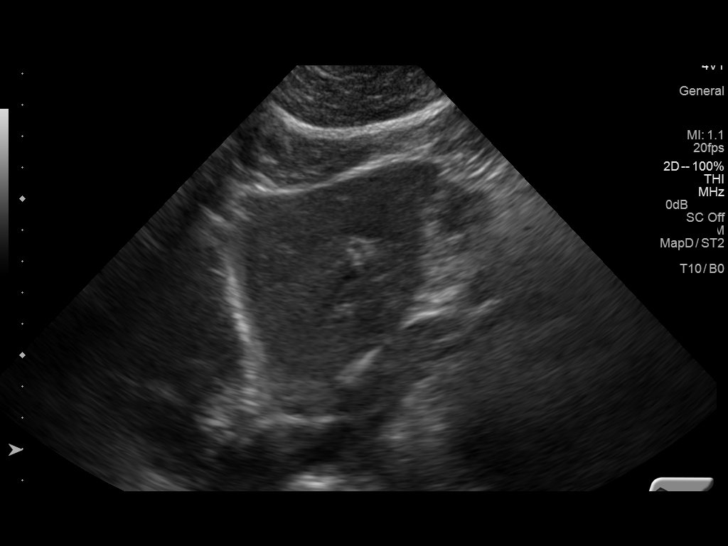
[im 37/45]
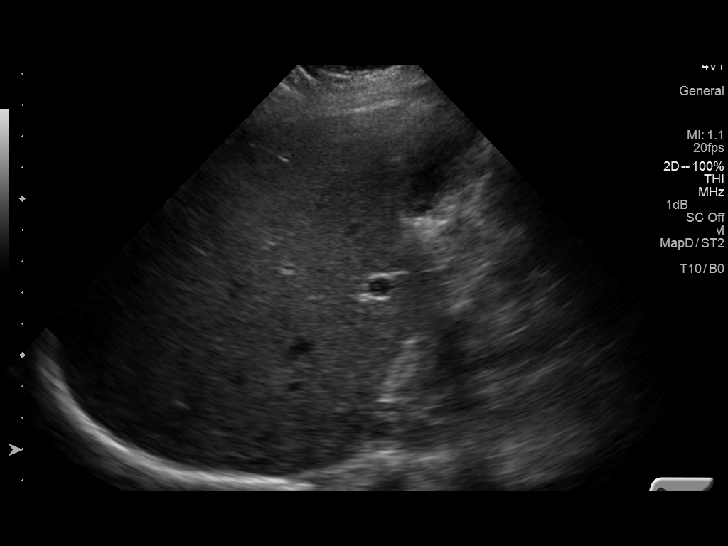
[im 41/45]
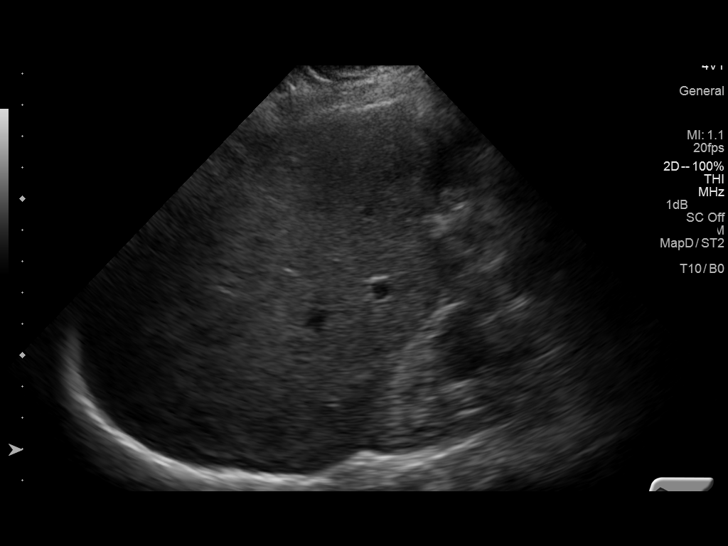
[im 45/45]
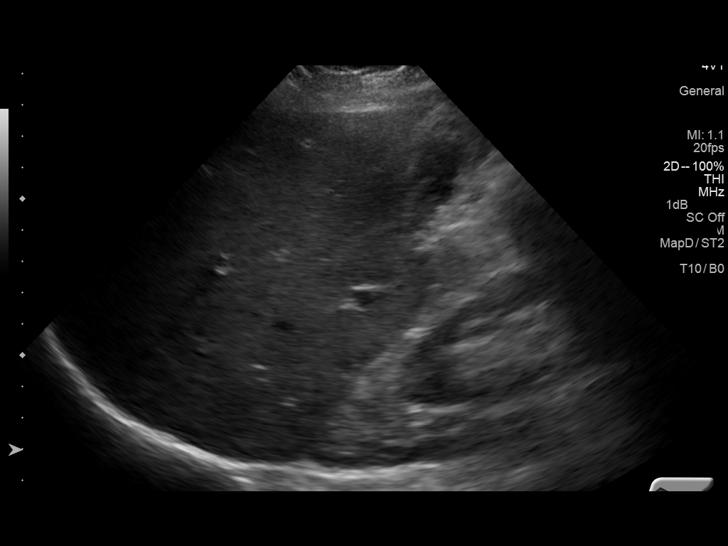

[14 of 25 positions shown; findings below may reference images not displayed]

FINDINGS: Gallbladder:

Multiple gallstones are present measuring up to 10 mm in diameter.
These were noted to be calcified on the prior CT. Gallbladder well
distended. Gallbladder wall 1.8 mm. Negative sonographic Murphy sign

Common bile duct:

Diameter: 2.3 mm

Liver:

No focal lesion identified. Within normal limits in parenchymal
echogenicity.
IMPRESSION: Cholelithiasis.

## 2015-08-21 DIAGNOSIS — N99820 Postprocedural hemorrhage and hematoma of a genitourinary system organ or structure following a genitourinary system procedure: Secondary | ICD-10-CM | POA: Diagnosis not present

## 2015-08-21 DIAGNOSIS — L929 Granulomatous disorder of the skin and subcutaneous tissue, unspecified: Secondary | ICD-10-CM | POA: Diagnosis not present

## 2015-08-30 DIAGNOSIS — H04123 Dry eye syndrome of bilateral lacrimal glands: Secondary | ICD-10-CM | POA: Diagnosis not present

## 2015-08-30 DIAGNOSIS — H401134 Primary open-angle glaucoma, bilateral, indeterminate stage: Secondary | ICD-10-CM | POA: Diagnosis not present

## 2015-08-30 DIAGNOSIS — H1820 Unspecified corneal edema: Secondary | ICD-10-CM | POA: Diagnosis not present

## 2015-08-30 DIAGNOSIS — H1851 Endothelial corneal dystrophy: Secondary | ICD-10-CM | POA: Diagnosis not present

## 2015-09-15 DIAGNOSIS — H8113 Benign paroxysmal vertigo, bilateral: Secondary | ICD-10-CM | POA: Diagnosis not present

## 2015-09-15 DIAGNOSIS — Z6826 Body mass index (BMI) 26.0-26.9, adult: Secondary | ICD-10-CM | POA: Diagnosis not present

## 2015-09-15 DIAGNOSIS — J069 Acute upper respiratory infection, unspecified: Secondary | ICD-10-CM | POA: Diagnosis not present

## 2015-10-02 DIAGNOSIS — Z23 Encounter for immunization: Secondary | ICD-10-CM | POA: Diagnosis not present

## 2015-10-04 DIAGNOSIS — H1851 Endothelial corneal dystrophy: Secondary | ICD-10-CM | POA: Diagnosis not present

## 2015-10-04 DIAGNOSIS — H04123 Dry eye syndrome of bilateral lacrimal glands: Secondary | ICD-10-CM | POA: Diagnosis not present

## 2015-10-04 DIAGNOSIS — H401134 Primary open-angle glaucoma, bilateral, indeterminate stage: Secondary | ICD-10-CM | POA: Diagnosis not present

## 2015-10-04 DIAGNOSIS — H1820 Unspecified corneal edema: Secondary | ICD-10-CM | POA: Diagnosis not present

## 2015-12-06 ENCOUNTER — Other Ambulatory Visit: Payer: Self-pay | Admitting: General Surgery

## 2015-12-06 DIAGNOSIS — Z853 Personal history of malignant neoplasm of breast: Secondary | ICD-10-CM

## 2015-12-06 DIAGNOSIS — Z803 Family history of malignant neoplasm of breast: Secondary | ICD-10-CM

## 2015-12-07 ENCOUNTER — Other Ambulatory Visit (HOSPITAL_COMMUNITY): Payer: Self-pay | Admitting: Internal Medicine

## 2015-12-07 DIAGNOSIS — Z9889 Other specified postprocedural states: Secondary | ICD-10-CM

## 2015-12-12 ENCOUNTER — Other Ambulatory Visit (HOSPITAL_COMMUNITY): Payer: Self-pay | Admitting: Internal Medicine

## 2015-12-12 ENCOUNTER — Ambulatory Visit (HOSPITAL_COMMUNITY)
Admission: RE | Admit: 2015-12-12 | Discharge: 2015-12-12 | Disposition: A | Discharge status: Home / Self Care | Payer: Medicare Other | Source: Ambulatory Visit | Attending: Internal Medicine | Admitting: Internal Medicine

## 2015-12-12 DIAGNOSIS — Z9889 Other specified postprocedural states: Secondary | ICD-10-CM | POA: Insufficient documentation

## 2015-12-12 DIAGNOSIS — R928 Other abnormal and inconclusive findings on diagnostic imaging of breast: Secondary | ICD-10-CM | POA: Diagnosis not present

## 2015-12-12 DIAGNOSIS — Z853 Personal history of malignant neoplasm of breast: Secondary | ICD-10-CM | POA: Diagnosis not present

## 2015-12-12 DIAGNOSIS — N6001 Solitary cyst of right breast: Secondary | ICD-10-CM | POA: Diagnosis not present

## 2015-12-12 IMAGING — MG MM AP DIAG 20 MIN
6 of 9 series · 6 of 25 positions shown · non-contrast
Comparison: Previous examinations the most recent of which is dated
[DATE].

CLINICAL DATA: History of malignant lumpectomy of the left breast
in [3Z]. Annual re-evaluation.

EXAM:
DIGITAL DIAGNOSTIC BILATERAL MAMMOGRAM WITH 3D TOMOSYNTHESIS WITH
CAD
ULTRASOUND RIGHT BREAST

[L CC (1 of 2)]
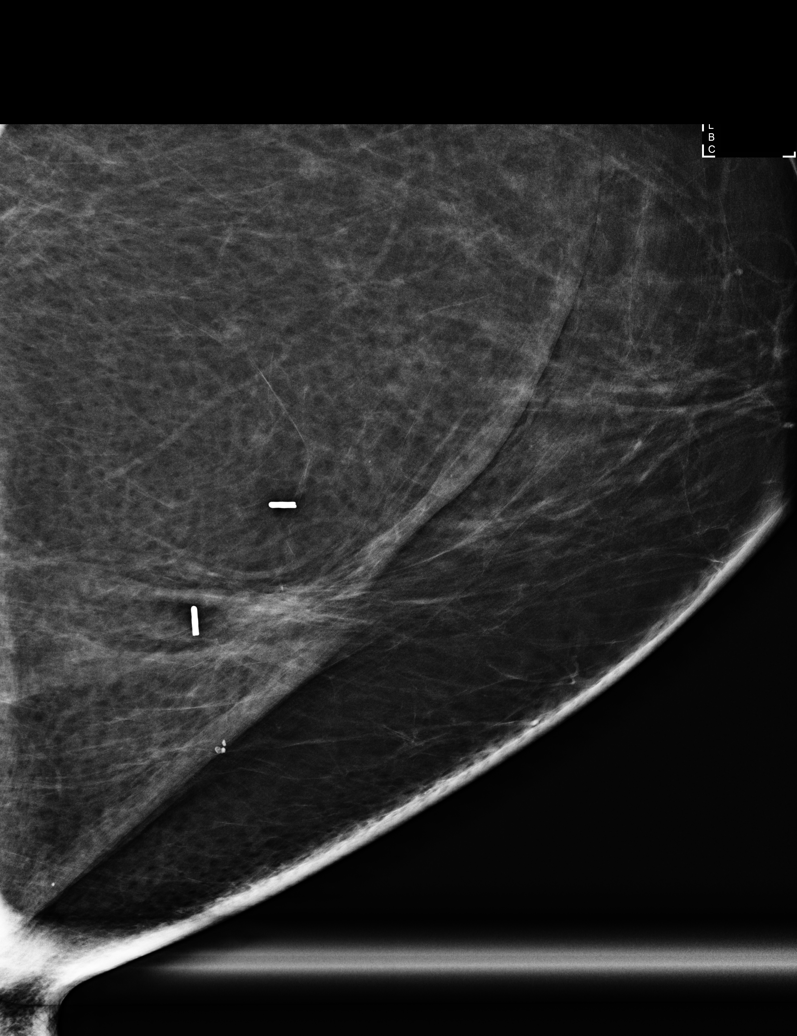

[L CC (2 of 2)]
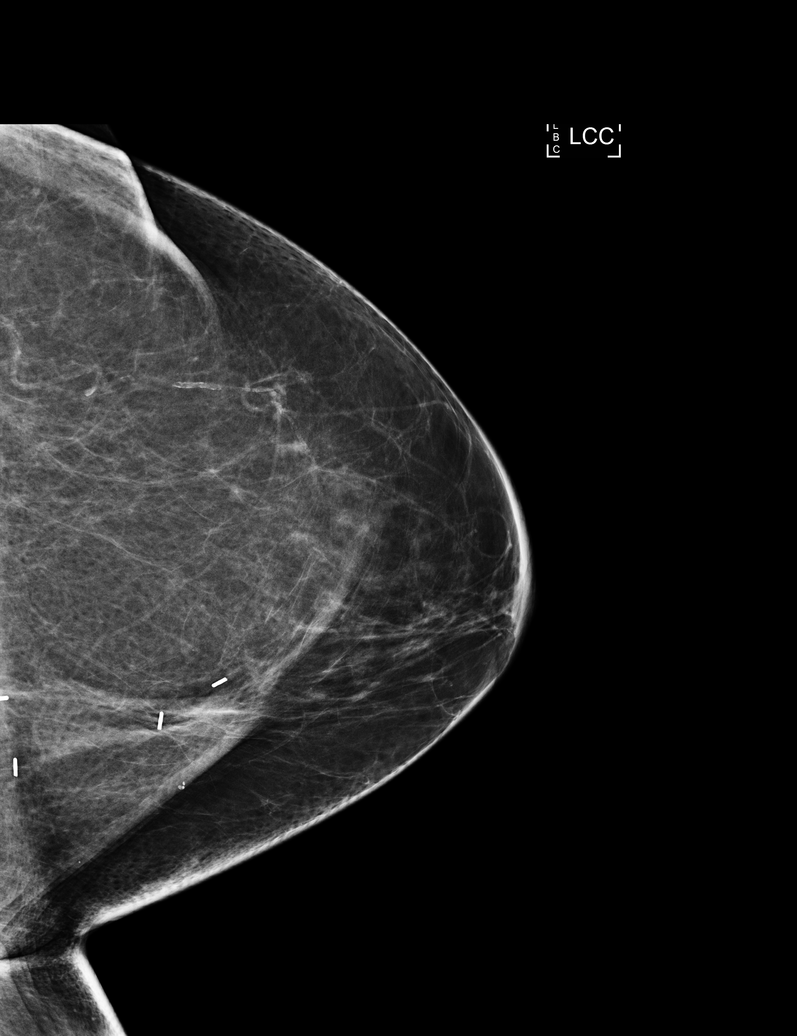

[R MLO]
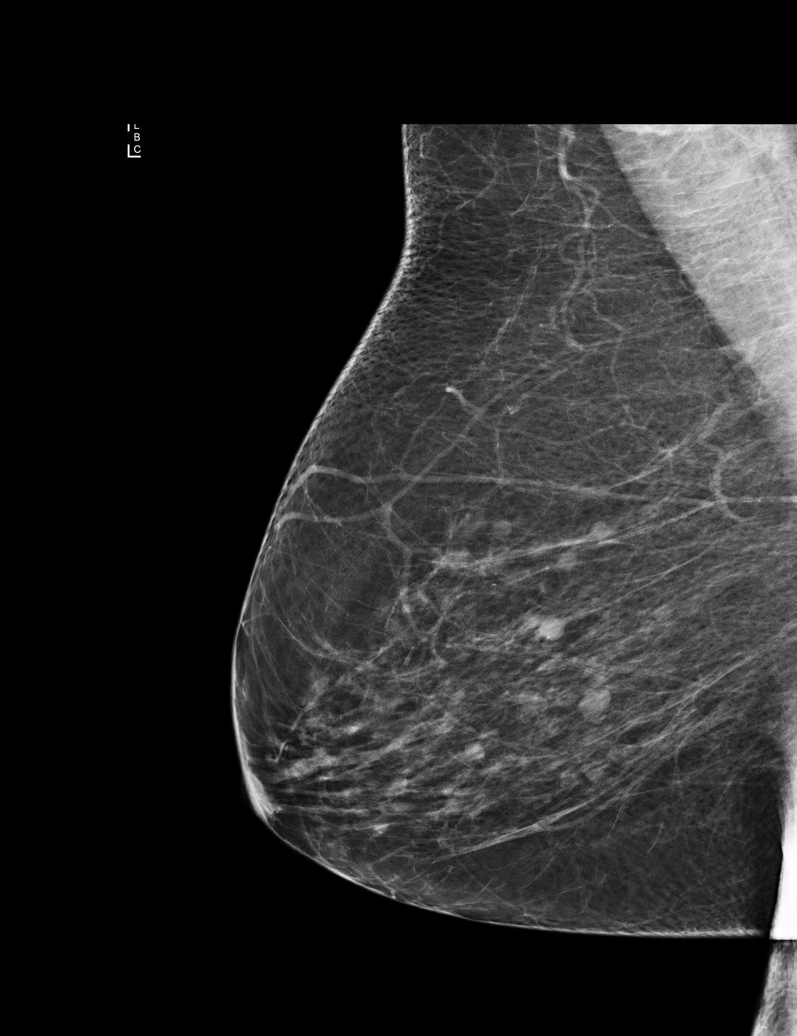

[L MLO]
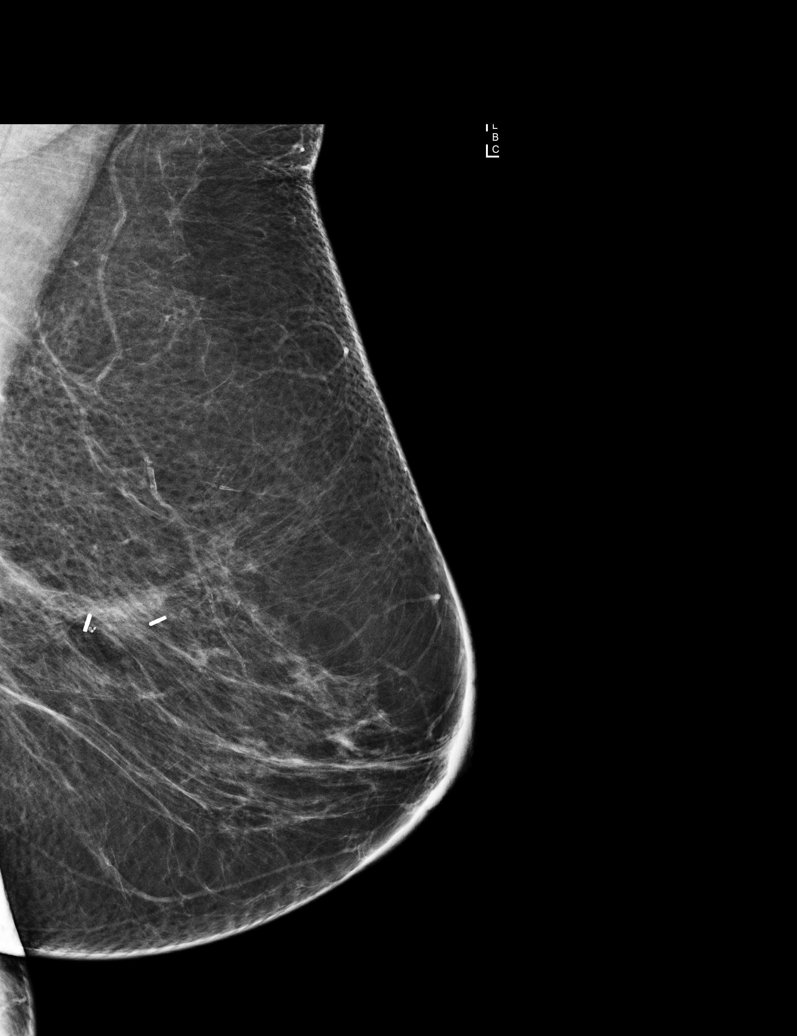

[R CC]
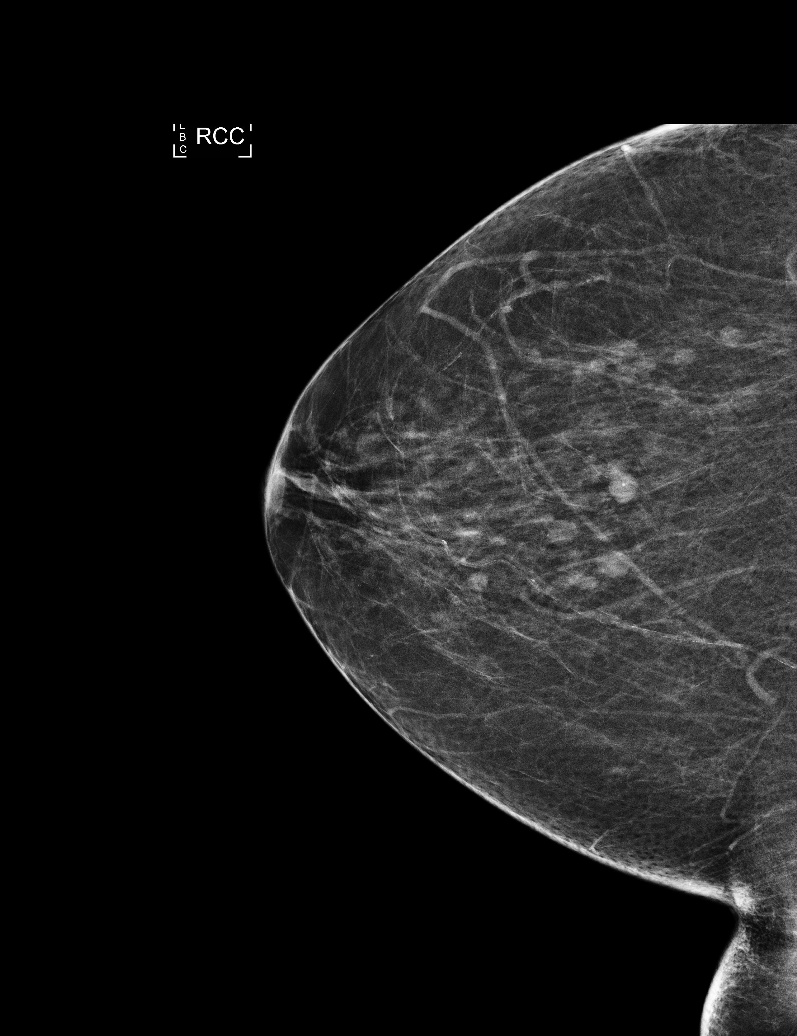

[L MLO tomo · tomo slice 33/65.0]
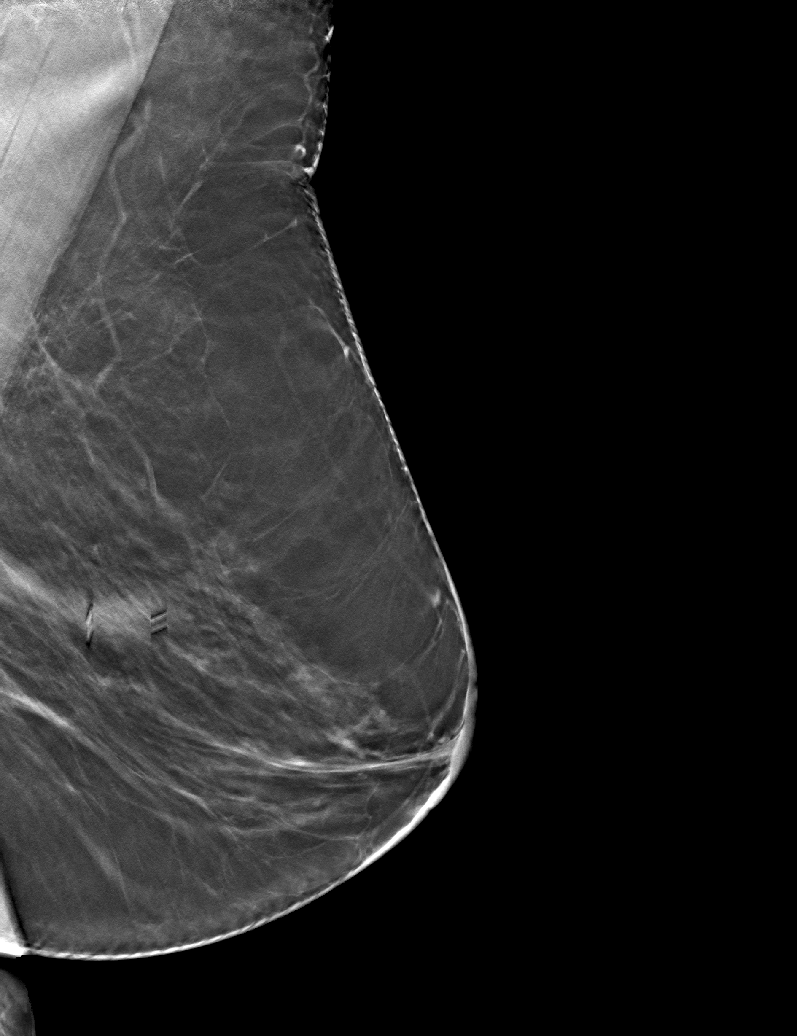

[6 of 25 positions shown; findings below may reference images not displayed]

ACR Breast Density Category b: There are scattered areas of
fibroglandular density.
FINDINGS: There are scarring changes located medially within the left breast
related to the patient's previous lumpectomy. There is no specific
evidence for recurrent tumor or developing malignancy within the
left breast and the parenchymal pattern is stable.

The patient has developed a new, oval, circumscribed mass located
within the subareolar portion of the right breast at the 12:30
o'clock position. This is located slightly medially and inferiorly
with respect to a stable circumscribed nodule which contains a
central area of calcification. The remainder of the right breast is
stable.

Mammographic images were processed with CAD.

On physical exam, there is no discrete palpable abnormality within
the area of mammographic concern within the right breast.

Targeted ultrasound is performed, showing a benign-appearing cyst
located within the right breast at the 12:30 o'clock position 2 cm
from the nipple measuring 7 x 5 x 5 mm in size. This is located
medial and inferior to it a second cyst with dependent calcification
located within the right breast at 12 o'clock position 3 cm from the
nipple measuring 6 mm in size and corresponding to the stable
mammographic finding in this region. There is no mass, distortion,
or worrisome calcification.
IMPRESSION: 1. No findings worrisome for recurrent tumor or developing
malignancy within either breast.
2. Benign cyst located within the right breast at the 12:30 o'clock
position 2 cm from the nipple which has developed when compared with
prior studies.

RECOMMENDATION:
Bilateral diagnostic mammography in 1 year.

I have discussed the findings and recommendations with the patient.
Results were also provided in writing at the conclusion of the
visit. If applicable, a reminder letter will be sent to the patient
regarding the next appointment.

BI-RADS CATEGORY  2: Benign.

## 2016-01-14 ENCOUNTER — Encounter (HOSPITAL_COMMUNITY): Payer: Self-pay | Admitting: Emergency Medicine

## 2016-01-14 ENCOUNTER — Emergency Department (HOSPITAL_COMMUNITY)
Admission: EM | Admit: 2016-01-14 | Discharge: 2016-01-14 | Disposition: A | Discharge status: Home / Self Care | Payer: Medicare Other | Attending: Emergency Medicine | Admitting: Emergency Medicine

## 2016-01-14 DIAGNOSIS — Z036 Encounter for observation for suspected toxic effect from ingested substance ruled out: Secondary | ICD-10-CM | POA: Diagnosis not present

## 2016-01-14 DIAGNOSIS — M1990 Unspecified osteoarthritis, unspecified site: Secondary | ICD-10-CM | POA: Insufficient documentation

## 2016-01-14 DIAGNOSIS — Z853 Personal history of malignant neoplasm of breast: Secondary | ICD-10-CM | POA: Diagnosis not present

## 2016-01-14 DIAGNOSIS — R0602 Shortness of breath: Secondary | ICD-10-CM | POA: Diagnosis present

## 2016-01-14 DIAGNOSIS — Z87891 Personal history of nicotine dependence: Secondary | ICD-10-CM | POA: Diagnosis not present

## 2016-01-14 DIAGNOSIS — T189XXA Foreign body of alimentary tract, part unspecified, initial encounter: Secondary | ICD-10-CM

## 2016-01-14 DIAGNOSIS — Z8719 Personal history of other diseases of the digestive system: Secondary | ICD-10-CM | POA: Diagnosis not present

## 2016-01-14 MED ORDER — EPINEPHRINE 0.3 MG/0.3ML IJ SOAJ
0.3000 mg | Freq: Once | INTRAMUSCULAR | Status: DC
  Filled 2016-01-14: qty 0.3

## 2016-01-14 MED ORDER — DIPHENHYDRAMINE HCL 50 MG/ML IJ SOLN
25.0000 mg | Freq: Once | INTRAMUSCULAR | Status: DC
  Filled 2016-01-14: qty 1

## 2016-01-14 MED ORDER — METHYLPREDNISOLONE SODIUM SUCC 125 MG IJ SOLR
125.0000 mg | Freq: Once | INTRAMUSCULAR | Status: DC
  Filled 2016-01-14: qty 2

## 2016-01-14 MED ORDER — FAMOTIDINE IN NACL 20-0.9 MG/50ML-% IV SOLN
20.0000 mg | INTRAVENOUS | Status: DC
  Filled 2016-01-14: qty 50

## 2016-01-14 MED ORDER — EPINEPHRINE 0.3 MG/0.3ML IJ SOAJ
INTRAMUSCULAR | Status: AC
  Filled 2016-01-14: qty 0.3

## 2016-01-14 NOTE — ED Notes (Signed)
Maudie Mercury- Daughter [phone]

## 2016-01-14 NOTE — ED Notes (Signed)
Patient states she was peeling potatoes and had a coughing spell. Took 200 mg Tesslon pearl that was Rx'd by PMD. Started feeling short of breath soon after taking. Anxious.

## 2016-01-14 NOTE — ED Notes (Signed)
Dr. Sabra Heck gave order for pt to have water and hold off on medications ordered at this time.

## 2016-01-14 NOTE — ED Provider Notes (Signed)
CSN: [id number]     Arrival date & time 01/14/16  1025 History  By signing my name below, I, Jolayne Panther, attest that this documentation has been prepared under the direction and in the presence of Noemi Chapel, MD. Electronically Signed: Jolayne Panther, Scribe. 01/14/2016. 10:54 AM.   Chief Complaint  Patient presents with  . Shortness of Breath   The history is provided by the patient. No language interpreter was used.    HPI Comments: Kara Woods is a 82 y.o. female who presents to the Emergency Department complaining of sudden onset, persistent, severe tongue swelling, choking, and difficulty swallowing immediately following consumption of one tablet of tessalon which she took to dry up her mucous about thirty minutes ago. She states that nothing makes her symptoms better or worse. Pt reports that she had never taken tessalon before and also notes that she has never had an allergic reaction to anything before. She denies SOB and rash. She reports that she takes tranxene daily as well as tylenol for her arthritis. Pt is otherwise healthy.  The medicine was Tessalon cough perles.  Past Medical History  Diagnosis Date  . Proctitis     colonsocopy 2007, Canasa suppositories  . S/P endoscopy March 2009    mild erosive reflux esophagitis, Schatzki's ring, s/p dilation  . Schatzki's ring   . Colitis   . Arthritis   . Wears glasses   . Anxiety   . Depression   . Wears dentures     upper  . HOH (hard of hearing)   . Blind left eye   . Breast disorder     cancer left  . Cystocele 10/11/2013  . Pelvic relaxation 10/11/2013  . Shortness of breath     occ if anxiety  . GERD (gastroesophageal reflux disease)     occasional Tums only  . Cancer [facility])     left breast    Past Surgical History  Procedure Laterality Date  . Lumbar disc surgery  1993  . Retinal detachment surgery  1978    Left  . Colonoscopy  05/06/2006    Diffuse inflammatory changes of the rectal  mucosa, consistent  with proctitis.  Otherwise, normal colon to terminal ileum  . Esophagogastroduodenoscopy  02/09/2008    Schatzki ring status post dilation/Distal esophageal erosion consistent with mild erosive reflux  esophagitis, otherwise unremarkable esophagus, normal stomach, D1, D2.  . Eye surgery      lt cataract-implant  . Eye surgery      lt-lens repaced,l  . Breast lumpectomy with needle localization and axillary sentinel lymph node bx Left 05/03/2013    Procedure: BREAST LUMPECTOMY WITH NEEDLE LOCALIZATION AND AXILLARY SENTINEL LYMPH NODE BX;  Surgeon: Edward Jolly, MD;  Location: [place];  Service: General;  Laterality: Left;  . Re-excision of breast lumpectomy Left 05/19/2013    Procedure: RE-EXCISION OF BREAST LUMPECTOMY;  Surgeon: Edward Jolly, MD;  Location: [place];  Service: General;  Laterality: Left;  . Breast surgery Left 05/19/13  . Cardiac catheterization  1998  . Vaginal hysterectomy Bilateral 05/17/2014    Procedure: HYSTERECTOMY VAGINAL with Bilateral Salpingo-Oophorectomy; Bladder cystotomy repair;  Surgeon: Marvene Staff, MD;  Location: [employer];  Service: Gynecology;  Laterality: Bilateral;  . Cystoscopy N/A 05/17/2014    Procedure: CYSTOSCOPY;  Surgeon: Reece Packer, MD;  Location: [place] [facility];  Service: Urology;  Laterality: N/A;  . Anterior and posterior repair N/A 05/17/2014  Procedure: ANTERIOR (CYSTOCELE) AND POSTERIOR REPAIR (RECTOCELE);  Surgeon: Reece Packer, MD;  Location: [place] [facility];  Service: Urology;  Laterality: N/A;  . Vaginal prolapse repair N/A 05/17/2014    Procedure: VAGINAL VAULT PROLAPSE AND GRAFT;  Surgeon: Reece Packer, MD;  Location: [facility];  Service: Urology;  Laterality: N/A;   Family History  Problem Relation Age of Onset  . Colon cancer Neg Hx   . Cancer Brother     spinal   Social History  Substance Use Topics  . Smoking status: Former Smoker -- 1.00 packs/day     Types: Cigarettes    Quit date: 04/28/1990  . Smokeless tobacco: Never Used     Comment: quit 19 yrs ago  . Alcohol Use: Yes     Comment: a glass of wine once or twice a week   OB History    Gravida Para Term Preterm AB TAB SAB Ectopic Multiple Living   6 6        5      Review of Systems  HENT: Positive for trouble swallowing. Facial swelling:  tongue swelling    Respiratory: Negative for shortness of breath.   Skin: Negative for rash.  Allergic/Immunologic:       Allergic reaction to Tessalon  All other systems reviewed and are negative.   Allergies  Sulfonamide derivatives  Home Medications   Prior to Admission medications   Medication Sig Start Date End Date Taking? Authorizing Provider  acetaminophen (TYLENOL) 325 MG tablet Take 650 mg by mouth every 6 (six) hours as needed.     Yes Historical Provider, MD  acetaminophen (TYLENOL) 500 MG tablet Take 500-1,000 mg by mouth every 6 (six) hours as needed for mild pain.   Yes Historical Provider, MD  clorazepate (TRANXENE) 7.5 MG tablet Take 7.5 mg by mouth 2 (two) times daily as needed for anxiety.   Yes Historical Provider, MD  Travoprost, BAK Free, (TRAVATAN) 0.004 % SOLN ophthalmic solution Place 1 drop into both eyes at bedtime.   Yes Historical Provider, MD   BP 155/70 mmHg  Pulse 100  Temp(Src) 97.5 F (36.4 C) (Oral)  Resp 23  SpO2 95% Physical Exam  Constitutional: She appears well-developed and well-nourished. No distress.  Pt is in moderate distress and anxious  HENT:  Head: Normocephalic and atraumatic.  Mouth/Throat: Oropharynx is clear and moist. No oropharyngeal exudate.  Oropharynx totally clear, no angioedema Normal voice  Eyes: Conjunctivae and EOM are normal. Pupils are equal, round, and reactive to light. Right eye exhibits no discharge. Left eye exhibits no discharge. No scleral icterus.  Neck: Normal range of motion. Neck supple. No JVD present. No thyromegaly present.  Cardiovascular: Normal  rate, regular rhythm, normal heart sounds and intact distal pulses.  Exam reveals no gallop and no friction rub.   No murmur heard. Pulmonary/Chest: Effort normal and breath sounds normal. No respiratory distress. She has no wheezes. She has no rales.  Abdominal: Soft. Bowel sounds are normal. She exhibits no distension and no mass. There is no tenderness.  Musculoskeletal: Normal range of motion. She exhibits no edema or tenderness.  Lymphadenopathy:    She has no cervical adenopathy.  Neurological: She is alert. Coordination normal.  Skin: Skin is warm and dry. No rash noted. No erythema.  Psychiatric: She has a normal mood and affect. Her behavior is normal.  Nursing note and vitals reviewed.   ED Course  Procedures  DIAGNOSTIC STUDIES:    Oxygen Saturation is 95% on  RA, adequate by my interpretation.   COORDINATION OF CARE:  10:50 AM Will administer pt medication in the ED. Will order EKG.  Discussed treatment plan with pt at bedside and pt agreed to plan.   I have personally reviewed and evaluated these images as part of my medical decision-making.   EKG Interpretation   Date/Time:  Sunday January 14 2016 10:44:50 EST Ventricular Rate:  96 PR Interval:  157 QRS Duration: 91 QT Interval:  380 QTC Calculation: 480 R Axis:   60 Text Interpretation:  Sinus rhythm Atrial premature complex Abnormal  R-wave progression, early transition Borderline T abnormalities, anterior  leads Nonspecific T wave abnormality now present Confirmed by Makelle Marrone  MD,  Conita Amenta ([id number]) on 01/14/2016 11:06:32 AM      MDM   Final diagnoses:  Foreign body, swallowed, initial encounter    The patient is well-appearing, her vital signs are unremarkable, during the course of her evaluation I had her drink a cup of water, she was able to swallow as well as cough up what appears to be a residual Tessalon capsule, it is yellow, clear, similar to the capsule that she has in her purse. These are gel  filled capsules. After doing that she feels back to normal, she refuses nasal laryngoscopy, she appears stable for discharge. She does now report that she has a history of some difficulty swallowing and dysphagia intermittently.  I personally performed the services described in this documentation, which was scribed in my presence. The recorded information has been reviewed and is accurate.      Noemi Chapel, MD 01/14/16 1131

## 2016-01-14 NOTE — Discharge Instructions (Signed)
Swallowed Foreign Body, Adult A swallowed foreign body means that you swallow something and it gets stuck. It might be food or something else. The object may get stuck in the tube that connects your throat to your stomach (esophagus), or it may get stuck in another part of your belly (digestive tract). It is very important to tell your doctor what you have swallowed. Sometimes, the object will pass through your body on its own. Sometimes, the object will pass after you are given a medicine to relax your throat. The object may need to be taken out by a doctor if it is dangerous or if it will not pass through your body on its own. An object may need to be removed with surgery if:  It gets stuck in your throat.  You cannot swallow.  You cannot breathe well.  It is sharp.  It is harmful or poisonous (toxic), like batteries or drugs. HOME CARE  Eat what you normally eat if your doctor says that you can.  Keep checking your poop (stool) to see if the object has come out of your body.  Call your doctor if the object has not come out of your body after 3 days. If you had surgery (endoscopy) to remove the object:  Care for yourself after surgery as told by your doctor. Keep all follow-up visits as told by your doctor. This is important. SEEK MEDICAL CARE IF:  You still have problems after you have been treated.  The object has not come out of your body after 3 days. GET HELP RIGHT AWAY IF:  You have a fever.  You have pain in your chest or your belly.  You cough up blood.  You have blood in your poop or your throw up (vomit).   This information is not intended to replace advice given to you by your health care provider. Make sure you discuss any questions you have with your health care provider.   Document Released: 02/26/2011 Document Revised: 08/02/2015 Document Reviewed: 02/08/2015 Elsevier Interactive Patient Education [employer].

## 2016-01-14 NOTE — ED Notes (Signed)
Pt stated that she is filling normal at this time with no difficulty breathing.

## 2016-01-19 DIAGNOSIS — H1851 Endothelial corneal dystrophy: Secondary | ICD-10-CM | POA: Diagnosis not present

## 2016-01-19 DIAGNOSIS — H401131 Primary open-angle glaucoma, bilateral, mild stage: Secondary | ICD-10-CM | POA: Diagnosis not present

## 2016-01-19 DIAGNOSIS — H04123 Dry eye syndrome of bilateral lacrimal glands: Secondary | ICD-10-CM | POA: Diagnosis not present

## 2016-01-19 DIAGNOSIS — H1820 Unspecified corneal edema: Secondary | ICD-10-CM | POA: Diagnosis not present

## 2016-01-23 ENCOUNTER — Ambulatory Visit (HOSPITAL_COMMUNITY)
Admission: RE | Admit: 2016-01-23 | Discharge: 2016-01-23 | Disposition: A | Discharge status: Home / Self Care | Payer: Medicare Other | Source: Ambulatory Visit | Attending: Internal Medicine | Admitting: Internal Medicine

## 2016-01-23 ENCOUNTER — Other Ambulatory Visit (HOSPITAL_COMMUNITY): Payer: Self-pay | Admitting: Internal Medicine

## 2016-01-23 DIAGNOSIS — F419 Anxiety disorder, unspecified: Secondary | ICD-10-CM | POA: Diagnosis not present

## 2016-01-23 DIAGNOSIS — R05 Cough: Secondary | ICD-10-CM | POA: Diagnosis not present

## 2016-01-23 DIAGNOSIS — R059 Cough, unspecified: Secondary | ICD-10-CM

## 2016-01-23 DIAGNOSIS — J0100 Acute maxillary sinusitis, unspecified: Secondary | ICD-10-CM | POA: Diagnosis not present

## 2016-01-23 IMAGING — DX DG CHEST 2V
2 series · 2 of 2 positions shown · non-contrast
Comparison: [DATE].

CLINICAL DATA: Cough and congestion.

EXAM:
CHEST  2 VIEW

[chest pa]
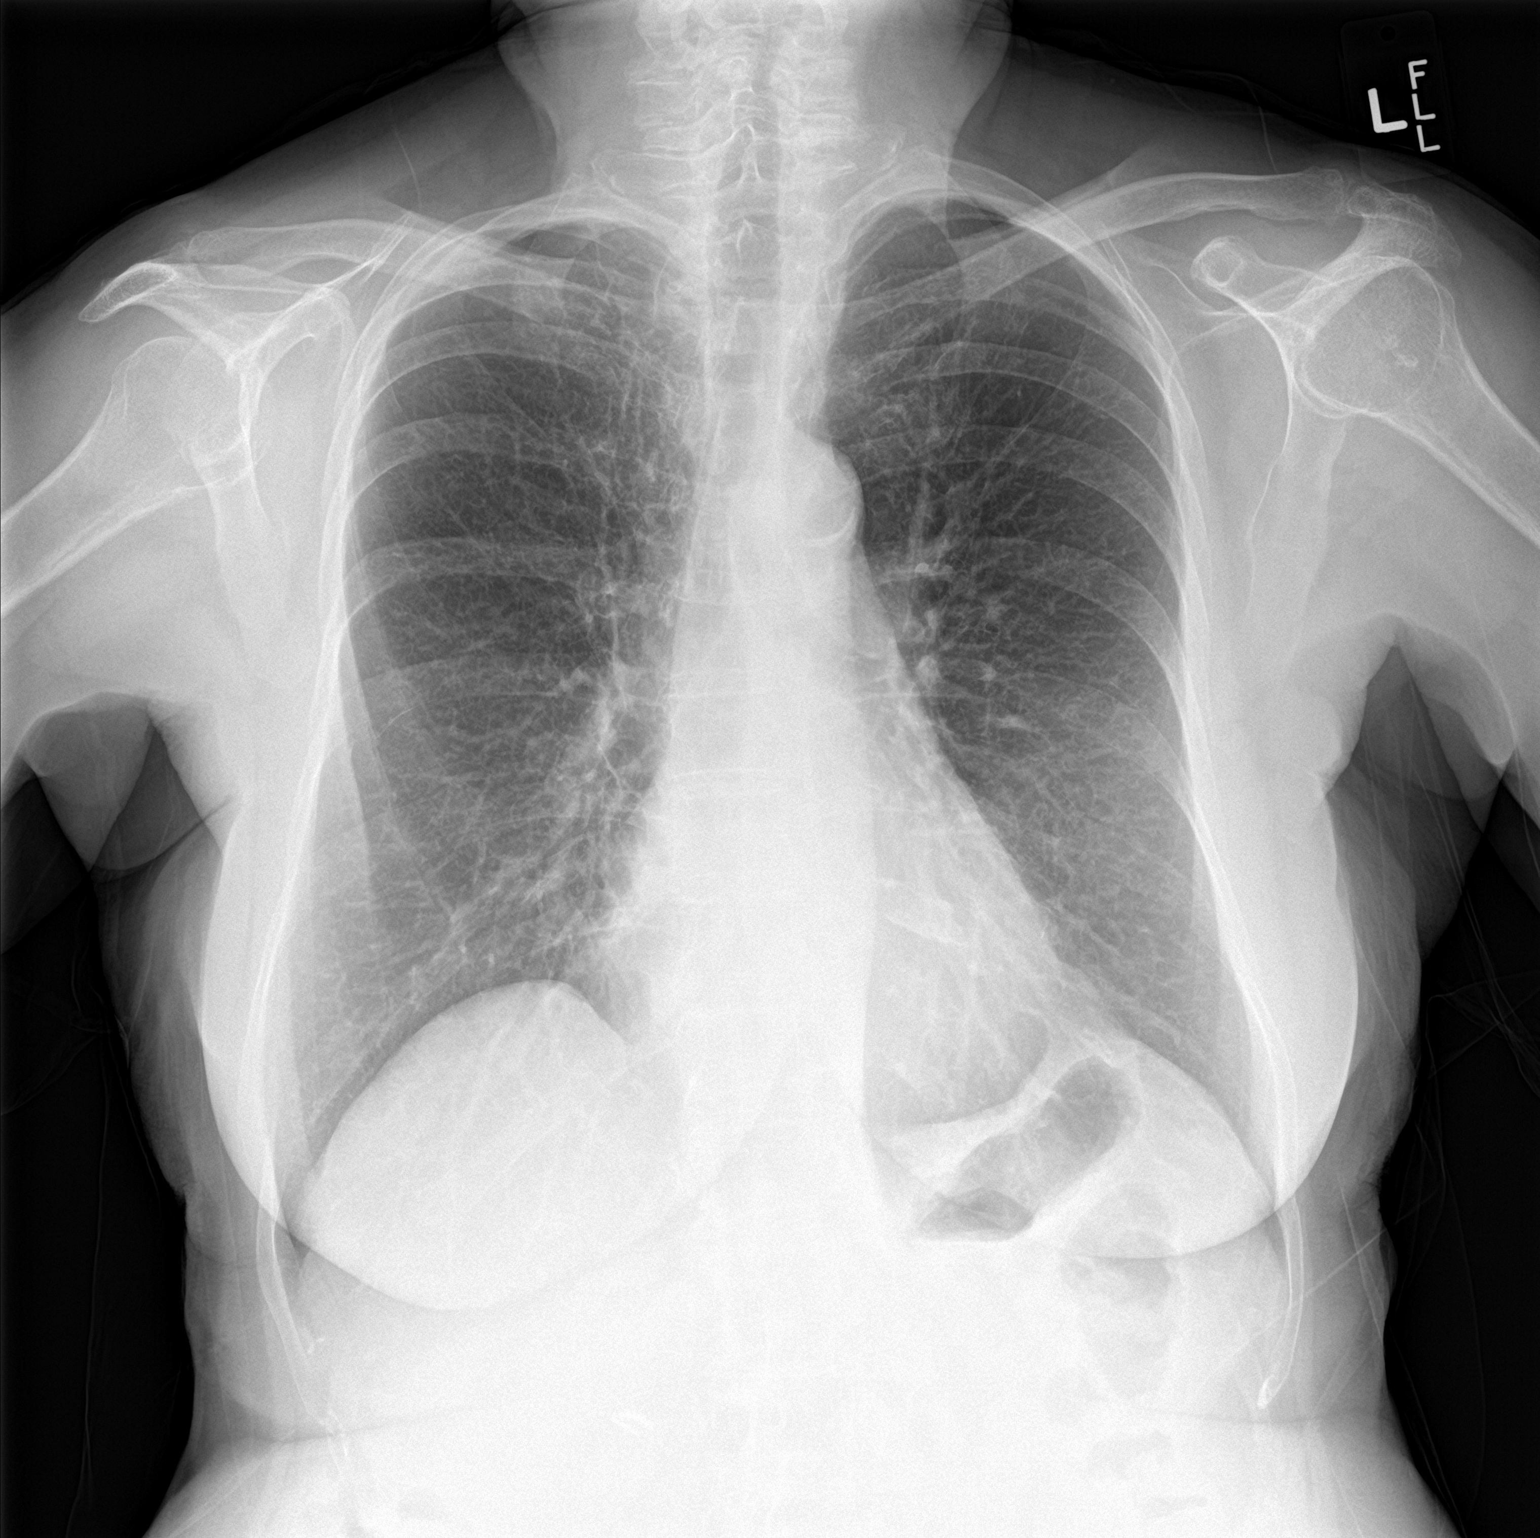

[chest lat]
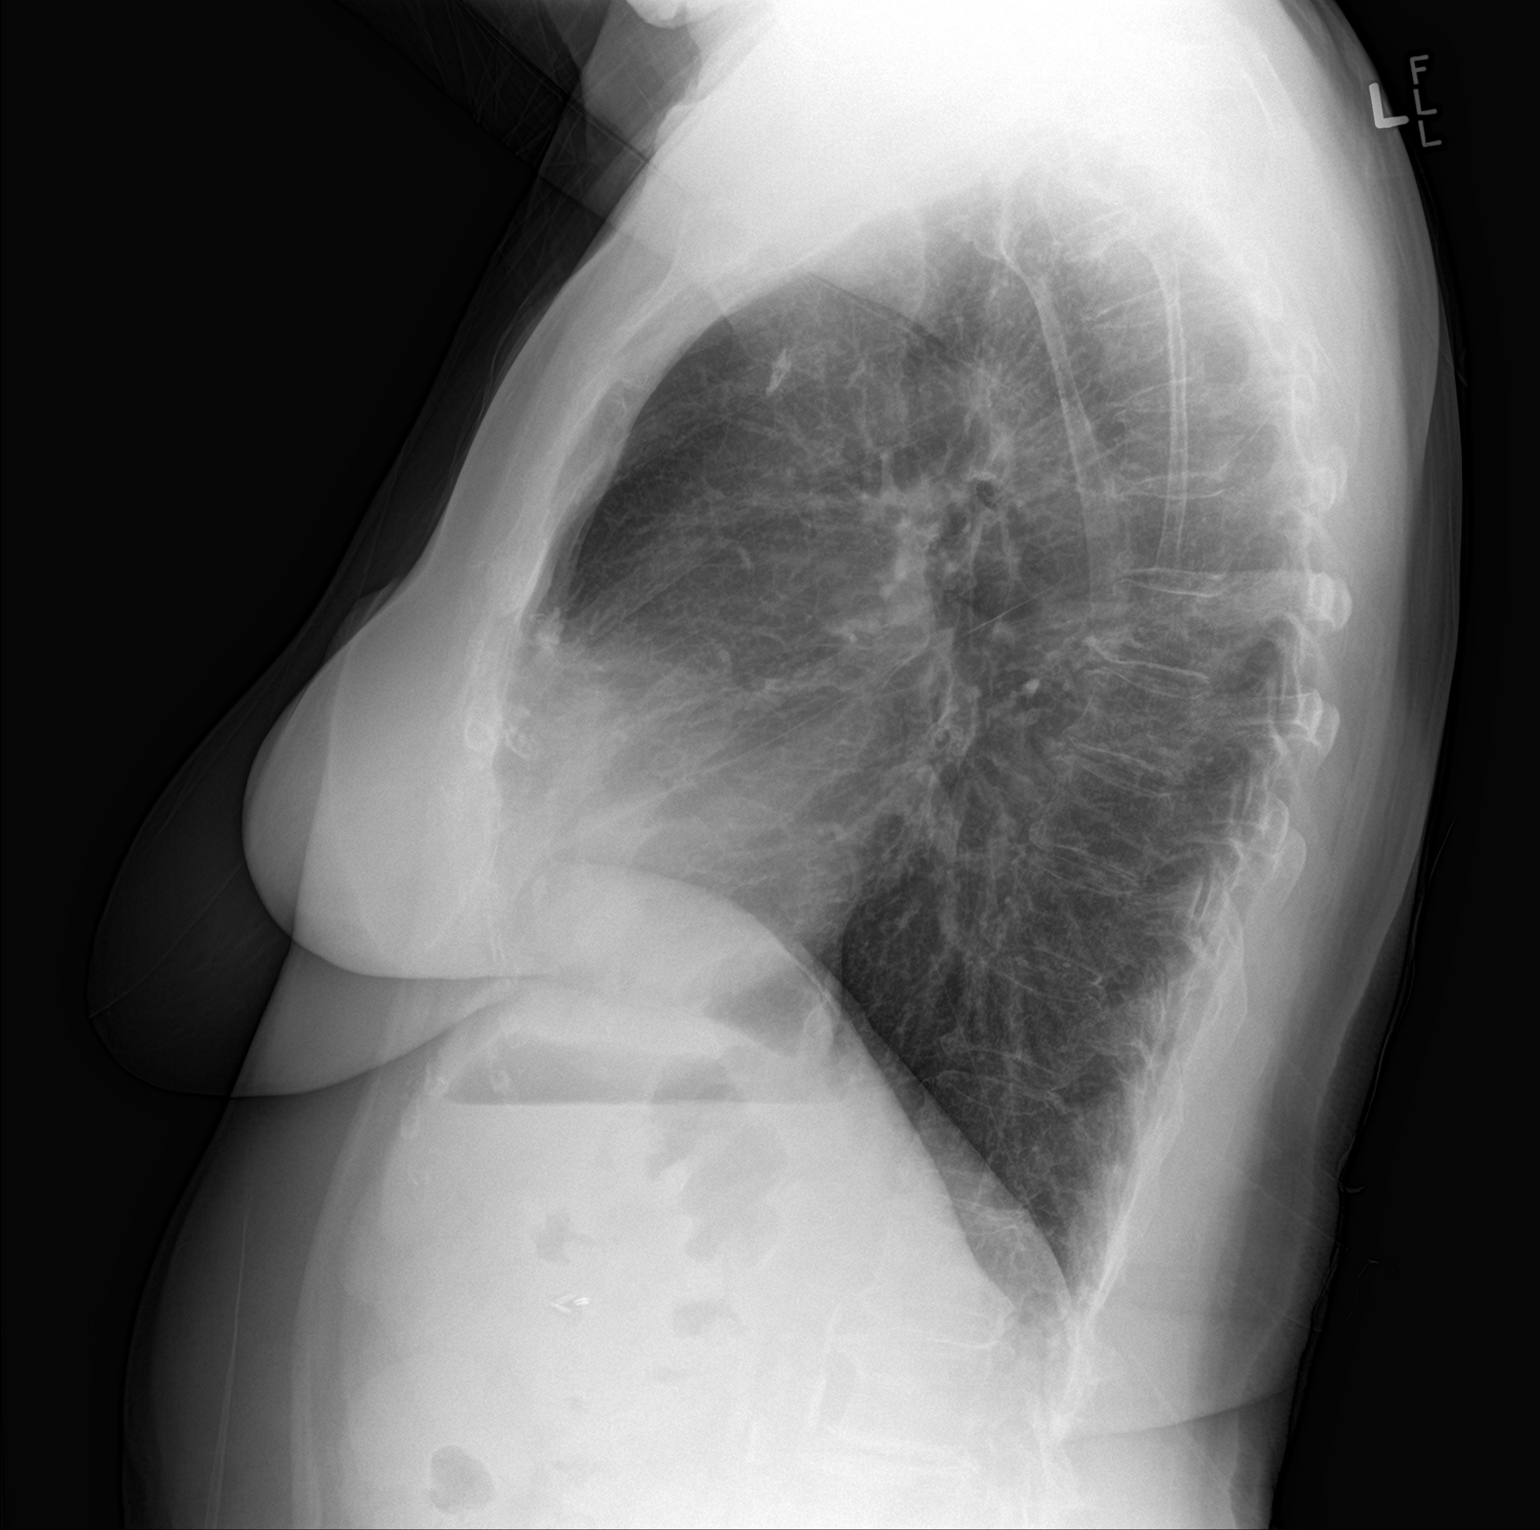

[2 of 2 positions shown; findings below may reference images not displayed]

FINDINGS: Mediastinum hilar structures normal. Mild lingular and/or right
middle lobe infiltrate cannot be excluded no pleural effusion
pneumothorax. Heart size stable. Surgical clips right upper
quadrant.
IMPRESSION: Mild lingular and/or right middle lobe infiltrate cannot be
excluded.

## 2016-02-10 IMAGING — US US BREAST LTD UNI RIGHT INC AXILLA
1 series · 13 of 13 positions shown · non-contrast
Comparison: Previous examinations the most recent of which is dated
[DATE].

CLINICAL DATA: History of malignant lumpectomy of the left breast
in [3Z]. Annual re-evaluation.

EXAM:
DIGITAL DIAGNOSTIC BILATERAL MAMMOGRAM WITH 3D TOMOSYNTHESIS WITH
CAD
ULTRASOUND RIGHT BREAST

[Series 1: us breast ltd uni right inc axilla · 0.07mm/px · 13 of 13 slices shown]
[im 1/13]
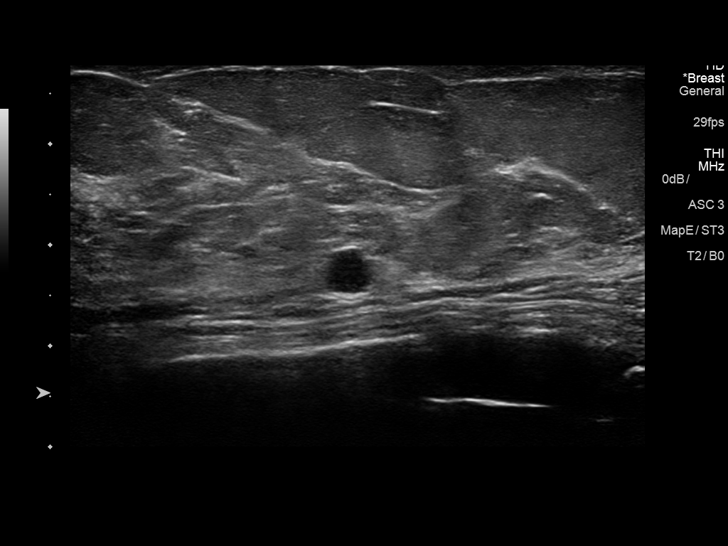
[im 2/13]
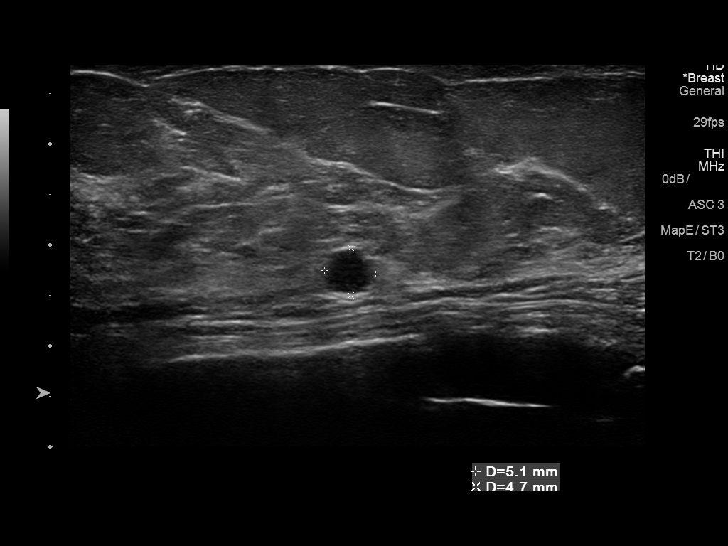
[im 3/13]
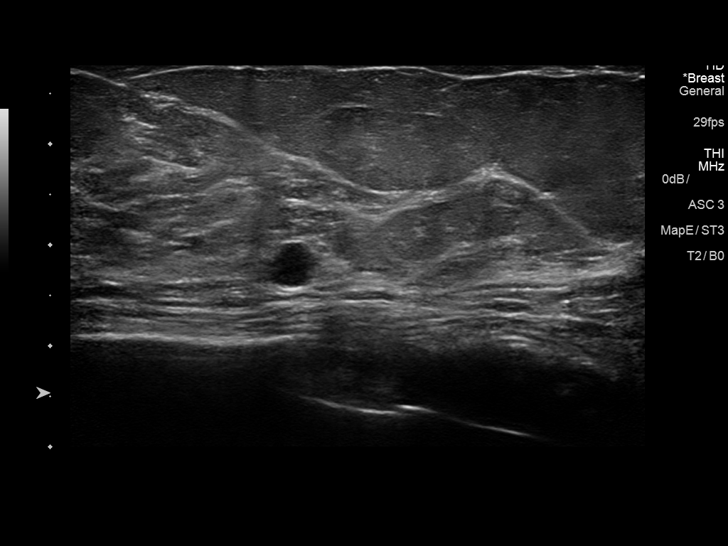
[im 4/13]
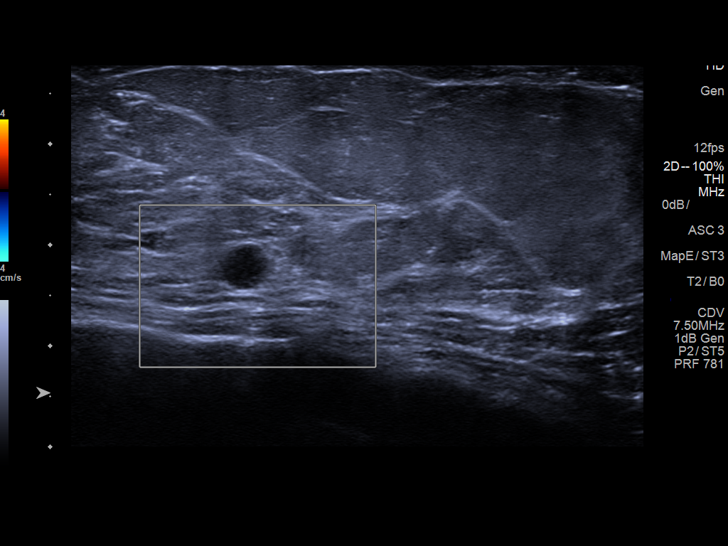
[im 5/13]
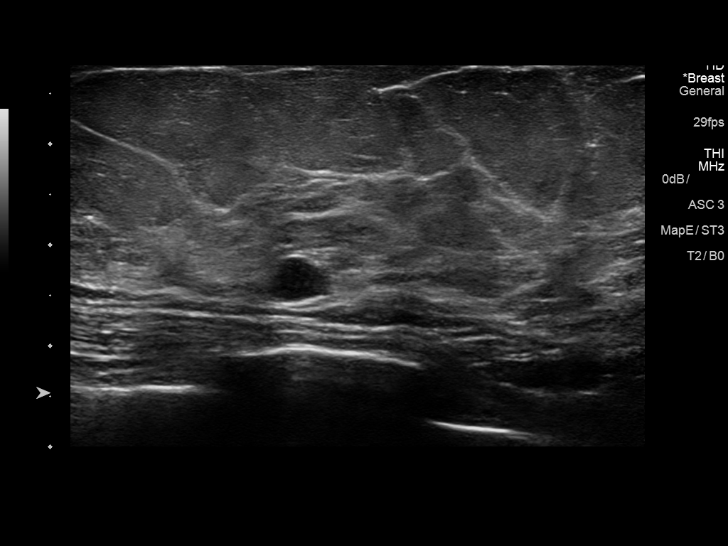
[im 6/13]
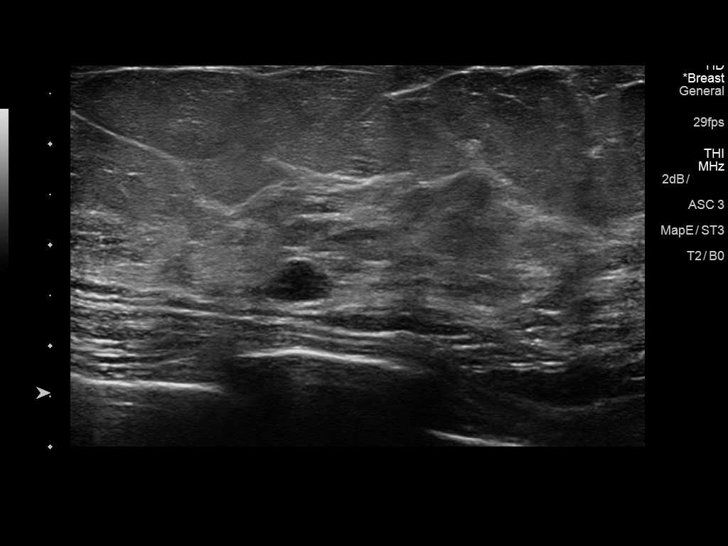
[im 7/13]
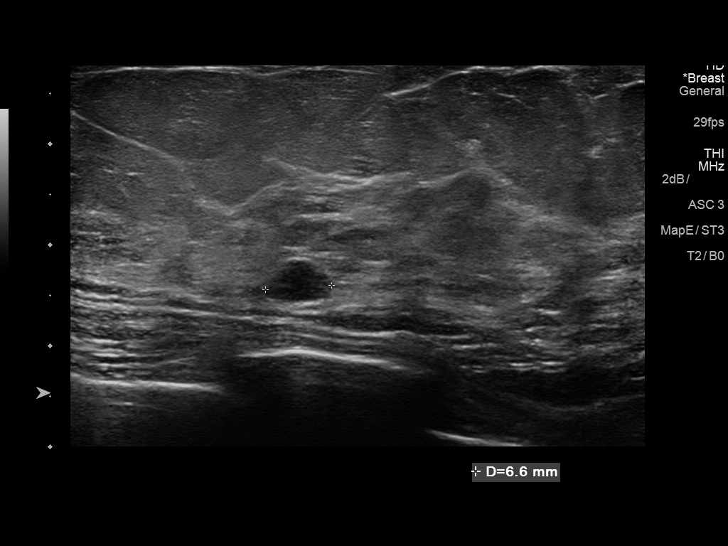
[im 8/13]
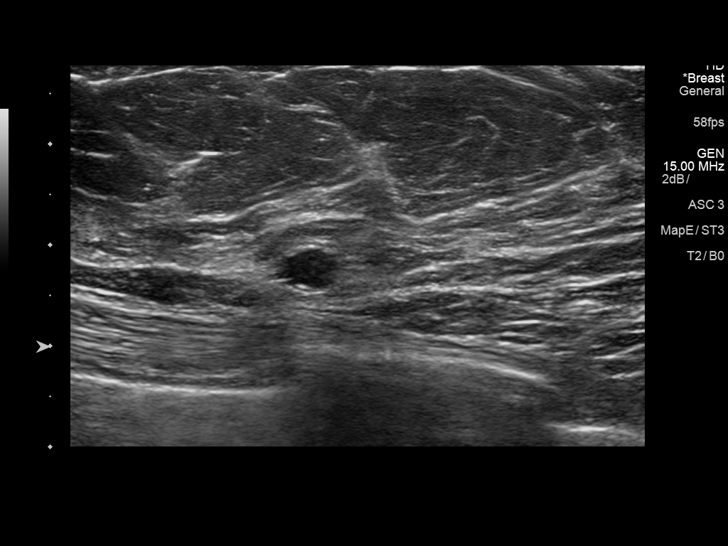
[im 9/13]
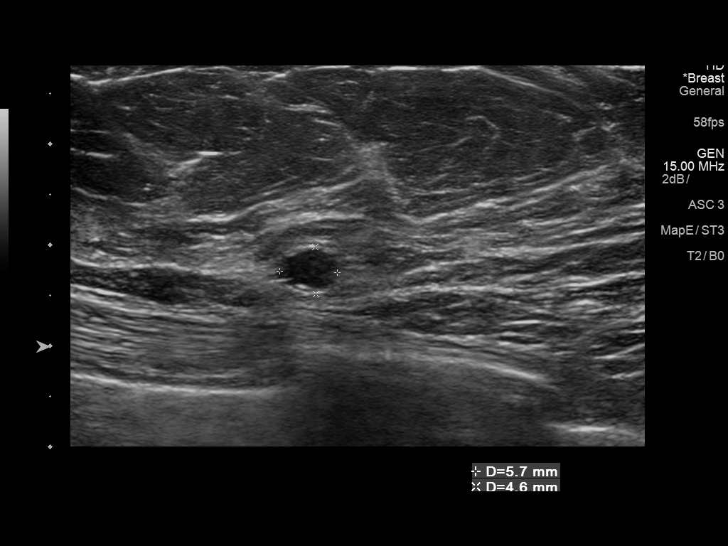
[im 10/13]
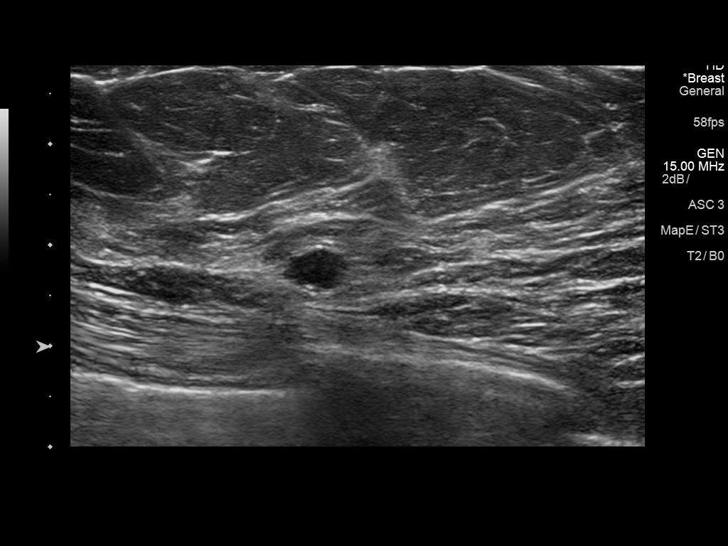
[im 11/13]
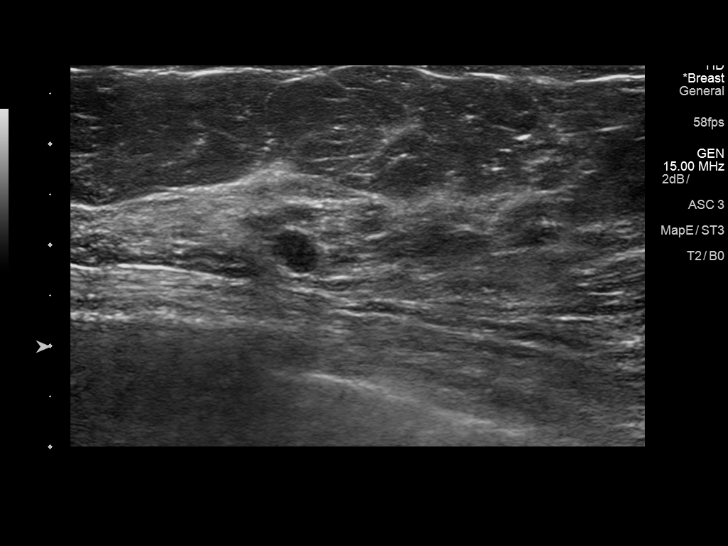
[im 12/13]
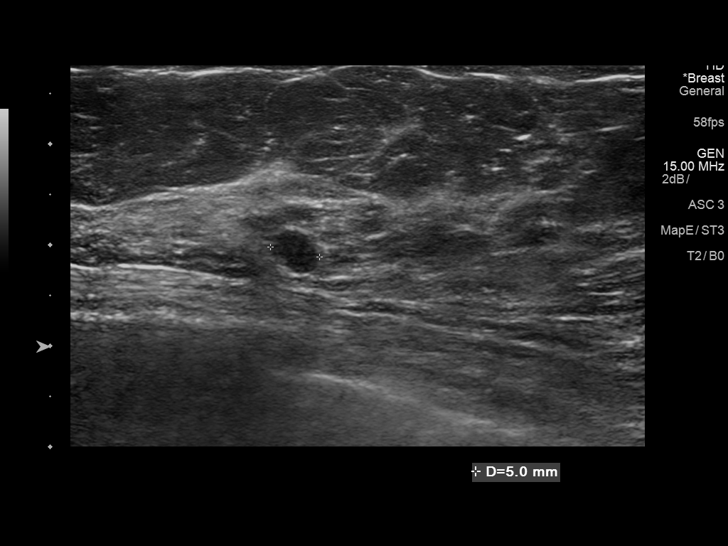
[im 13/13]
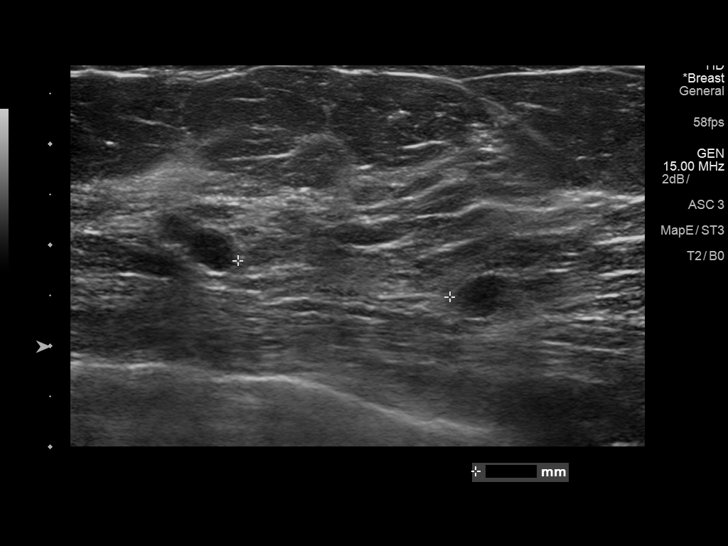

[13 of 13 positions shown; findings below may reference images not displayed]

ACR Breast Density Category b: There are scattered areas of
fibroglandular density.
FINDINGS: There are scarring changes located medially within the left breast
related to the patient's previous lumpectomy. There is no specific
evidence for recurrent tumor or developing malignancy within the
left breast and the parenchymal pattern is stable.

The patient has developed a new, oval, circumscribed mass located
within the subareolar portion of the right breast at the 12:30
o'clock position. This is located slightly medially and inferiorly
with respect to a stable circumscribed nodule which contains a
central area of calcification. The remainder of the right breast is
stable.

Mammographic images were processed with CAD.

On physical exam, there is no discrete palpable abnormality within
the area of mammographic concern within the right breast.

Targeted ultrasound is performed, showing a benign-appearing cyst
located within the right breast at the 12:30 o'clock position 2 cm
from the nipple measuring 7 x 5 x 5 mm in size. This is located
medial and inferior to it a second cyst with dependent calcification
located within the right breast at 12 o'clock position 3 cm from the
nipple measuring 6 mm in size and corresponding to the stable
mammographic finding in this region. There is no mass, distortion,
or worrisome calcification.
IMPRESSION: 1. No findings worrisome for recurrent tumor or developing
malignancy within either breast.
2. Benign cyst located within the right breast at the 12:30 o'clock
position 2 cm from the nipple which has developed when compared with
prior studies.

RECOMMENDATION:
Bilateral diagnostic mammography in 1 year.

I have discussed the findings and recommendations with the patient.
Results were also provided in writing at the conclusion of the
visit. If applicable, a reminder letter will be sent to the patient
regarding the next appointment.

BI-RADS CATEGORY  2: Benign.

## 2016-02-19 ENCOUNTER — Telehealth: Payer: Self-pay | Admitting: Internal Medicine

## 2016-02-19 NOTE — Telephone Encounter (Signed)
PATIENT ON RECALL FOR TCS °

## 2016-02-20 NOTE — Telephone Encounter (Signed)
Letter mailed

## 2016-02-26 DIAGNOSIS — H401131 Primary open-angle glaucoma, bilateral, mild stage: Secondary | ICD-10-CM | POA: Diagnosis not present

## 2016-02-26 DIAGNOSIS — H1820 Unspecified corneal edema: Secondary | ICD-10-CM | POA: Diagnosis not present

## 2016-02-26 DIAGNOSIS — H04123 Dry eye syndrome of bilateral lacrimal glands: Secondary | ICD-10-CM | POA: Diagnosis not present

## 2016-02-26 DIAGNOSIS — H1851 Endothelial corneal dystrophy: Secondary | ICD-10-CM | POA: Diagnosis not present

## 2016-03-12 DIAGNOSIS — M25562 Pain in left knee: Secondary | ICD-10-CM | POA: Diagnosis not present

## 2016-03-12 DIAGNOSIS — Z6826 Body mass index (BMI) 26.0-26.9, adult: Secondary | ICD-10-CM | POA: Diagnosis not present

## 2016-04-01 DIAGNOSIS — L0100 Impetigo, unspecified: Secondary | ICD-10-CM | POA: Diagnosis not present

## 2016-04-01 DIAGNOSIS — D225 Melanocytic nevi of trunk: Secondary | ICD-10-CM | POA: Diagnosis not present

## 2016-04-25 DIAGNOSIS — D0439 Carcinoma in situ of skin of other parts of face: Secondary | ICD-10-CM | POA: Diagnosis not present

## 2016-04-25 DIAGNOSIS — X32XXXD Exposure to sunlight, subsequent encounter: Secondary | ICD-10-CM | POA: Diagnosis not present

## 2016-04-25 DIAGNOSIS — L570 Actinic keratosis: Secondary | ICD-10-CM | POA: Diagnosis not present

## 2016-04-30 DIAGNOSIS — H04123 Dry eye syndrome of bilateral lacrimal glands: Secondary | ICD-10-CM | POA: Diagnosis not present

## 2016-04-30 DIAGNOSIS — H1851 Endothelial corneal dystrophy: Secondary | ICD-10-CM | POA: Diagnosis not present

## 2016-04-30 DIAGNOSIS — H1820 Unspecified corneal edema: Secondary | ICD-10-CM | POA: Diagnosis not present

## 2016-04-30 DIAGNOSIS — H524 Presbyopia: Secondary | ICD-10-CM | POA: Diagnosis not present

## 2016-04-30 DIAGNOSIS — H52221 Regular astigmatism, right eye: Secondary | ICD-10-CM | POA: Diagnosis not present

## 2016-04-30 DIAGNOSIS — H401131 Primary open-angle glaucoma, bilateral, mild stage: Secondary | ICD-10-CM | POA: Diagnosis not present

## 2016-04-30 DIAGNOSIS — H5203 Hypermetropia, bilateral: Secondary | ICD-10-CM | POA: Diagnosis not present

## 2016-05-15 DIAGNOSIS — X32XXXD Exposure to sunlight, subsequent encounter: Secondary | ICD-10-CM | POA: Diagnosis not present

## 2016-05-15 DIAGNOSIS — L570 Actinic keratosis: Secondary | ICD-10-CM | POA: Diagnosis not present

## 2016-05-31 DIAGNOSIS — L929 Granulomatous disorder of the skin and subcutaneous tissue, unspecified: Secondary | ICD-10-CM | POA: Diagnosis not present

## 2016-05-31 DIAGNOSIS — Z853 Personal history of malignant neoplasm of breast: Secondary | ICD-10-CM | POA: Diagnosis not present

## 2016-06-27 DIAGNOSIS — N390 Urinary tract infection, site not specified: Secondary | ICD-10-CM | POA: Diagnosis not present

## 2016-06-27 DIAGNOSIS — F419 Anxiety disorder, unspecified: Secondary | ICD-10-CM | POA: Diagnosis not present

## 2016-07-01 DIAGNOSIS — F419 Anxiety disorder, unspecified: Secondary | ICD-10-CM | POA: Diagnosis not present

## 2016-07-01 DIAGNOSIS — Z79899 Other long term (current) drug therapy: Secondary | ICD-10-CM | POA: Diagnosis not present

## 2016-09-01 ENCOUNTER — Emergency Department (HOSPITAL_COMMUNITY): Payer: Medicare Other

## 2016-09-01 ENCOUNTER — Emergency Department (HOSPITAL_COMMUNITY)
Admission: EM | Admit: 2016-09-01 | Discharge: 2016-09-01 | Disposition: A | Discharge status: Home / Self Care | Payer: Medicare Other | Attending: Emergency Medicine | Admitting: Emergency Medicine

## 2016-09-01 ENCOUNTER — Encounter (HOSPITAL_COMMUNITY): Payer: Self-pay | Admitting: Emergency Medicine

## 2016-09-01 DIAGNOSIS — X501XXA Overexertion from prolonged static or awkward postures, initial encounter: Secondary | ICD-10-CM | POA: Insufficient documentation

## 2016-09-01 DIAGNOSIS — R0602 Shortness of breath: Secondary | ICD-10-CM | POA: Diagnosis not present

## 2016-09-01 DIAGNOSIS — Z87891 Personal history of nicotine dependence: Secondary | ICD-10-CM | POA: Insufficient documentation

## 2016-09-01 DIAGNOSIS — M545 Low back pain: Secondary | ICD-10-CM | POA: Diagnosis not present

## 2016-09-01 DIAGNOSIS — Y9389 Activity, other specified: Secondary | ICD-10-CM | POA: Insufficient documentation

## 2016-09-01 DIAGNOSIS — Z853 Personal history of malignant neoplasm of breast: Secondary | ICD-10-CM | POA: Insufficient documentation

## 2016-09-01 DIAGNOSIS — Y999 Unspecified external cause status: Secondary | ICD-10-CM | POA: Diagnosis not present

## 2016-09-01 DIAGNOSIS — Y929 Unspecified place or not applicable: Secondary | ICD-10-CM | POA: Insufficient documentation

## 2016-09-01 DIAGNOSIS — S39012A Strain of muscle, fascia and tendon of lower back, initial encounter: Secondary | ICD-10-CM | POA: Insufficient documentation

## 2016-09-01 DIAGNOSIS — R0600 Dyspnea, unspecified: Secondary | ICD-10-CM

## 2016-09-01 LAB — URINALYSIS, ROUTINE W REFLEX MICROSCOPIC
Bilirubin Urine: NEGATIVE
Glucose, UA: NEGATIVE mg/dL
Hgb urine dipstick: NEGATIVE
Ketones, ur: NEGATIVE mg/dL
Leukocytes, UA: NEGATIVE
Nitrite: NEGATIVE
Protein, ur: NEGATIVE mg/dL
Specific Gravity, Urine: <1.005 — ABNORMAL LOW (ref 1.005–1.030)
pH: 7 (ref 5.0–8.0)

## 2016-09-01 LAB — BASIC METABOLIC PANEL
Anion gap: 3 — ABNORMAL LOW (ref 5–15)
BUN: 13 mg/dL (ref 6–20)
CO2: 28 mmol/L (ref 22–32)
Calcium: 8.6 mg/dL — ABNORMAL LOW (ref 8.9–10.3)
Chloride: 107 mmol/L (ref 101–111)
Creatinine, Ser: 0.75 mg/dL (ref 0.44–1.00)
GFR calc Af Amer: >60 mL/min (ref >60)
GFR calc non Af Amer: >60 mL/min (ref >60)
Glucose, Bld: 88 mg/dL (ref 65–99)
Potassium: 4.6 mmol/L (ref 3.5–5.1)
Sodium: 138 mmol/L (ref 135–145)

## 2016-09-01 LAB — CBC WITH DIFFERENTIAL/PLATELET
Basophils Absolute: 0 10*3/uL (ref 0.0–0.1)
Basophils Relative: 0 %
Eosinophils Absolute: 0.1 10*3/uL (ref 0.0–0.7)
Eosinophils Relative: 1 %
HCT: 39.1 % (ref 36.0–46.0)
Hemoglobin: 12.9 g/dL (ref 12.0–15.0)
Lymphocytes Relative: 43 %
Lymphs Abs: 3.1 10*3/uL (ref 0.7–4.0)
MCH: 30.9 pg (ref 26.0–34.0)
MCHC: 33 g/dL (ref 30.0–36.0)
MCV: 93.5 fL (ref 78.0–100.0)
Monocytes Absolute: 0.4 10*3/uL (ref 0.1–1.0)
Monocytes Relative: 6 %
Neutro Abs: 3.6 10*3/uL (ref 1.7–7.7)
Neutrophils Relative %: 50 %
Platelets: 219 10*3/uL (ref 150–400)
RBC: 4.18 MIL/uL (ref 3.87–5.11)
RDW: 13.6 % (ref 11.5–15.5)
WBC: 7.1 10*3/uL (ref 4.0–10.5)

## 2016-09-01 LAB — TROPONIN I: Troponin I: <0.03 ng/mL (ref <0.03)

## 2016-09-01 LAB — BRAIN NATRIURETIC PEPTIDE: B Natriuretic Peptide: 51 pg/mL (ref 0.0–100.0)

## 2016-09-01 LAB — D-DIMER, QUANTITATIVE: D-Dimer, Quant: 0.65 ug/mL-FEU — ABNORMAL HIGH (ref 0.00–0.50)

## 2016-09-01 IMAGING — DX DG CHEST 2V
2 series · 2 of 2 positions shown · non-contrast
Comparison: [DATE]

CLINICAL DATA: Shortness of breath for 3 months

EXAM:
CHEST  2 VIEW

[chest lat]
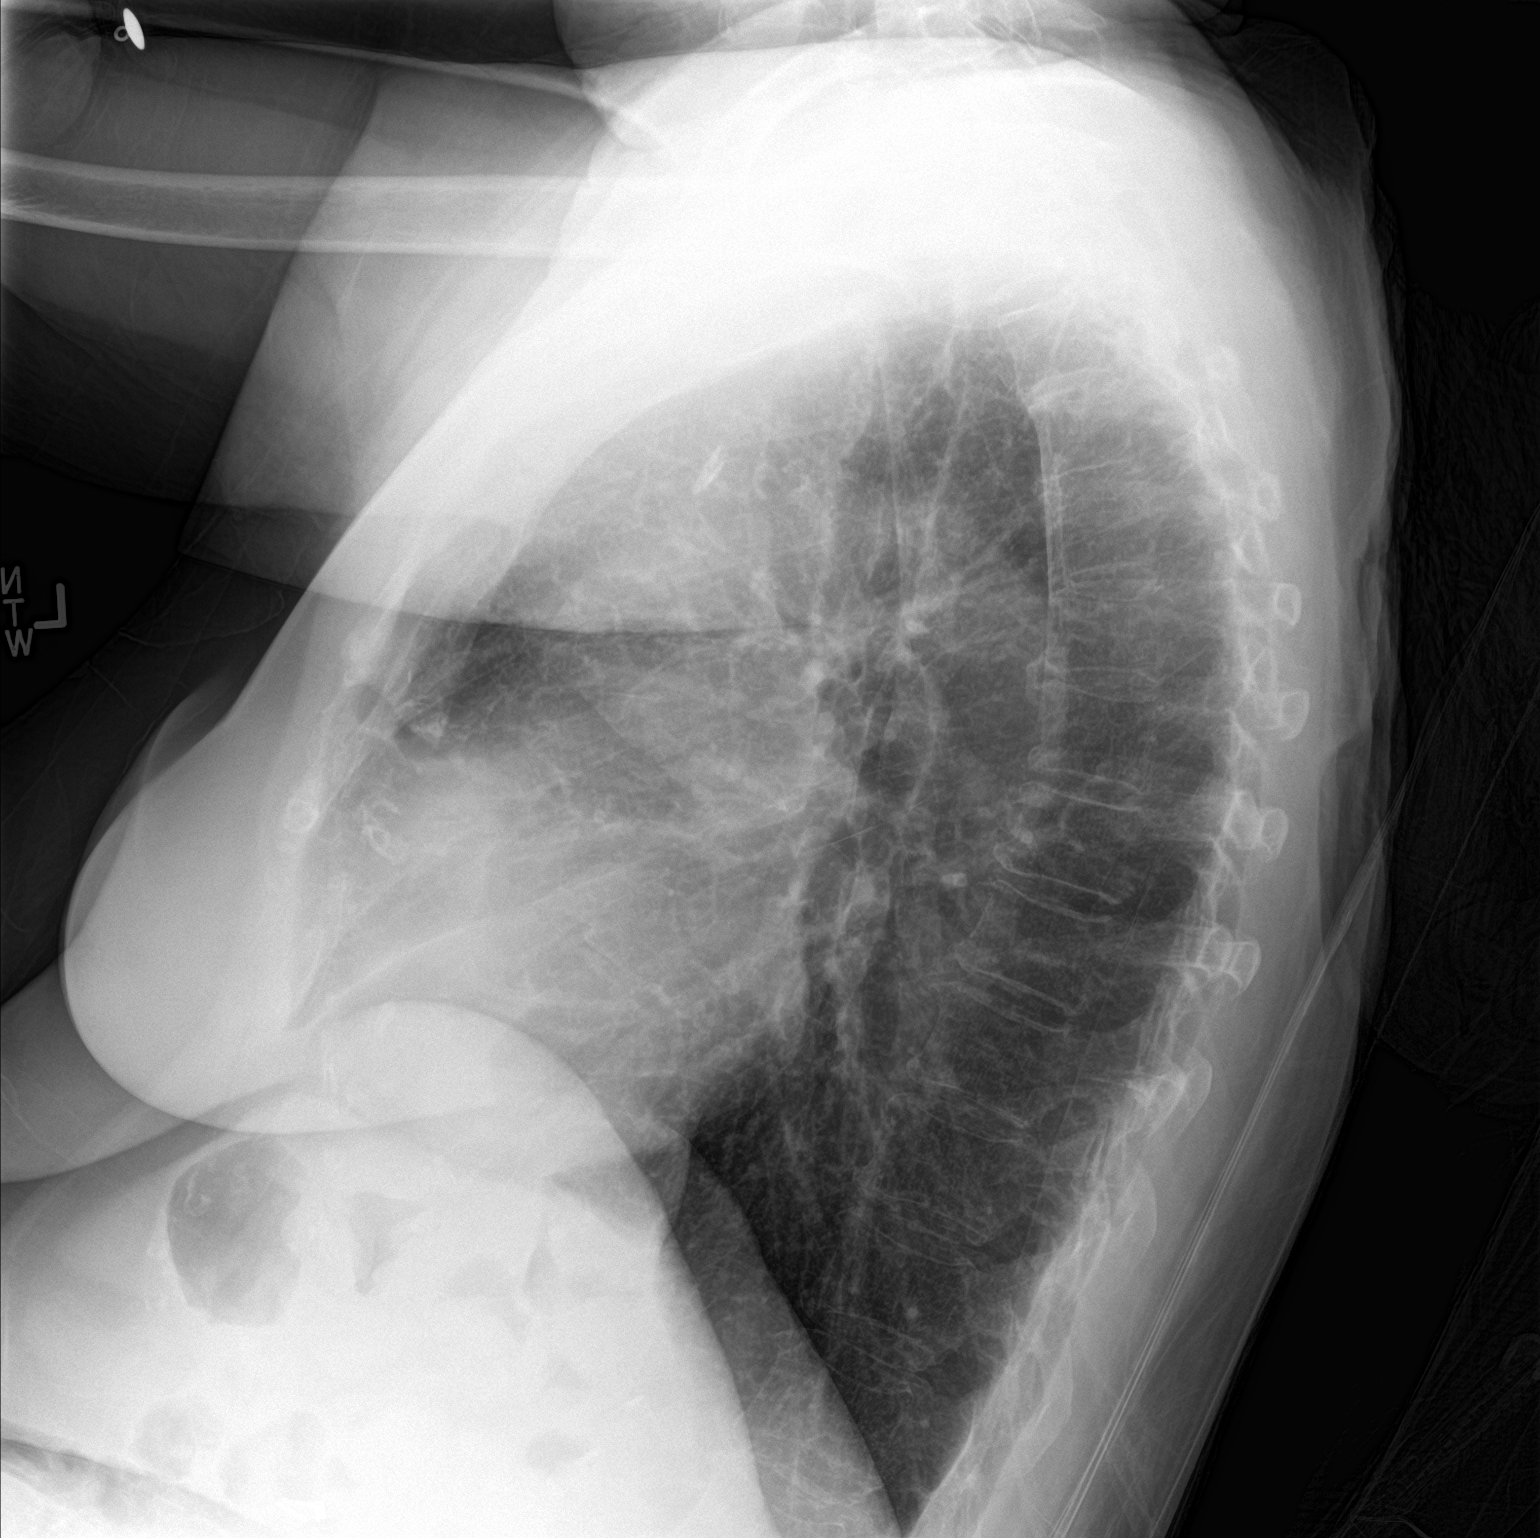

[chest ap]
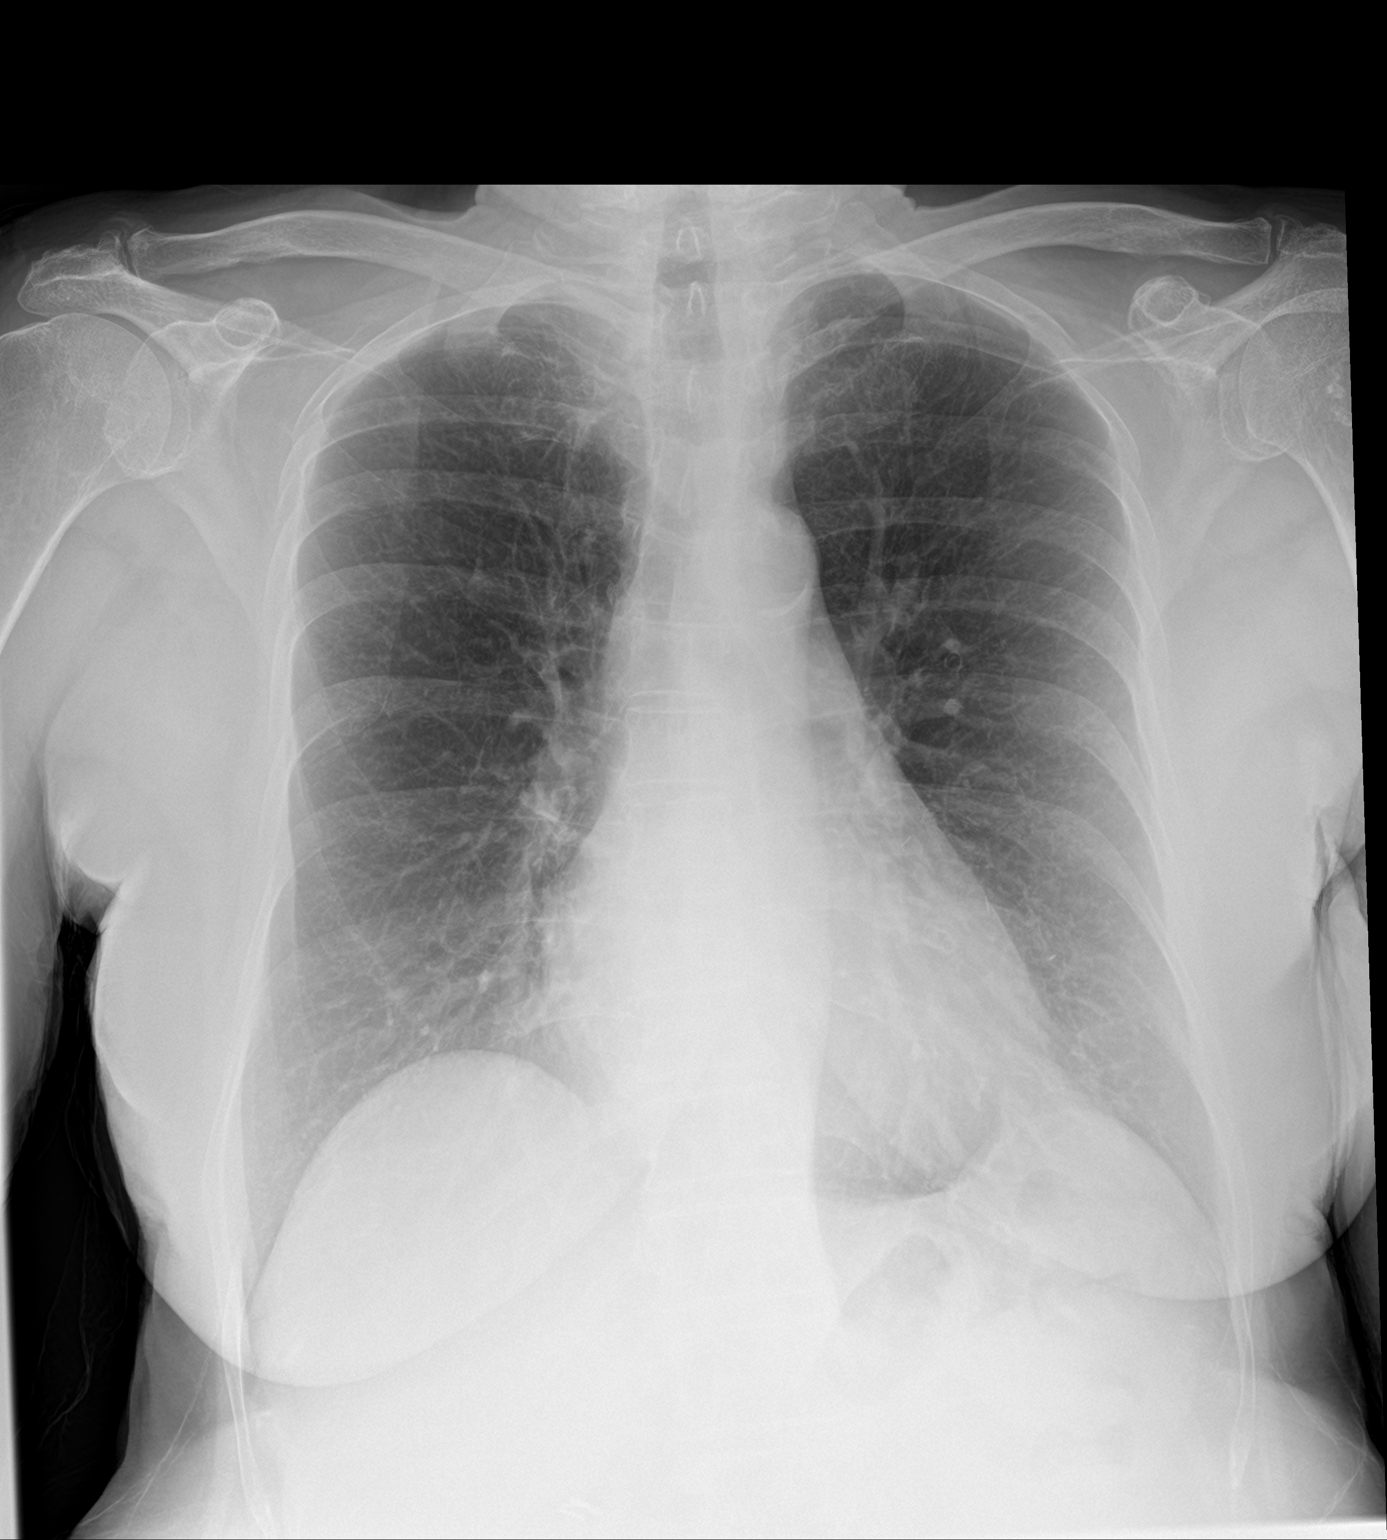

[2 of 2 positions shown; findings below may reference images not displayed]

FINDINGS: There is slight scarring in the right apex medially. Lungs elsewhere
clear. Heart size and pulmonary vascularity are normal. No
adenopathy. There is atherosclerotic calcification in the aorta. No
adenopathy. No bone lesions.
IMPRESSION: Mild scarring right apex medially. No edema or consolidation. There
is aortic atherosclerosis.

## 2016-09-01 IMAGING — CT CT ANGIO CHEST
2 of 6 series · 18 of 46 positions shown · IV contrast (APPLIED)
Comparison: CT abdomen [DATE]

CLINICAL DATA: 83-year-old female with a history of shortness of
breath for 2-3 weeks. D-dimer 65.

EXAM:
CT ANGIOGRAPHY CHEST WITH CONTRAST
TECHNIQUE: Multidetector CT imaging of the chest was performed using the
standard protocol during bolus administration of intravenous
contrast. Multiplanar CT image reconstructions and MIPs were
obtained to evaluate the vascular anatomy.
CONTRAST:  100 cc Isovue 370

[Series 8: thins · axial · 0.68mm/px · z∈[-320,-34]mm · 15 of 314 slices shown]
[im 14/314  lung]
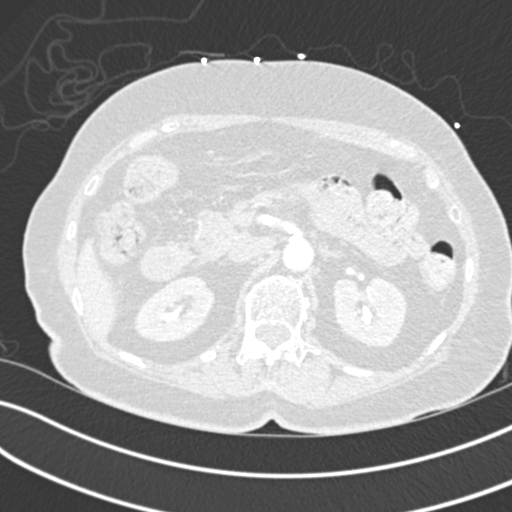
[im 41/314  soft-tissue]
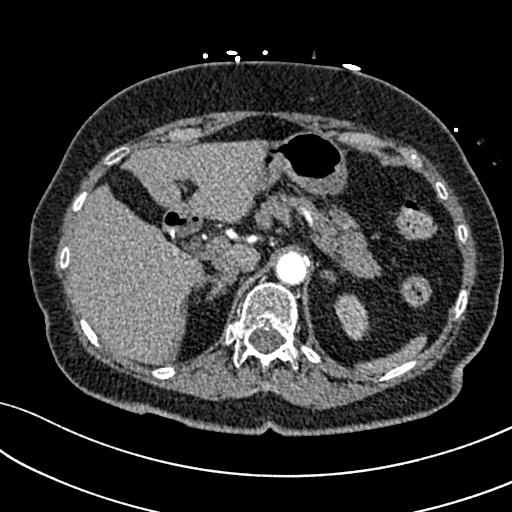
[im 55/314  lung]
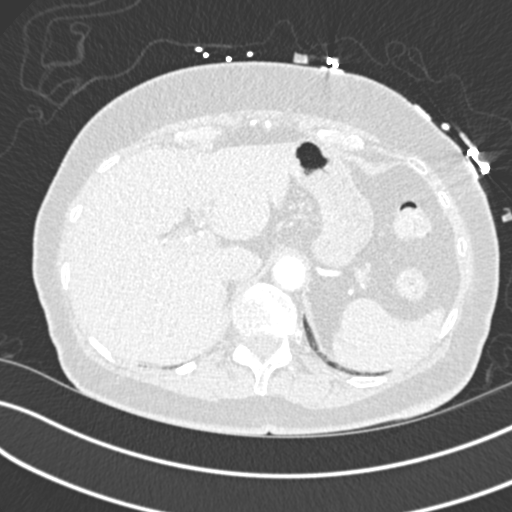
[im 82/314  soft-tissue]
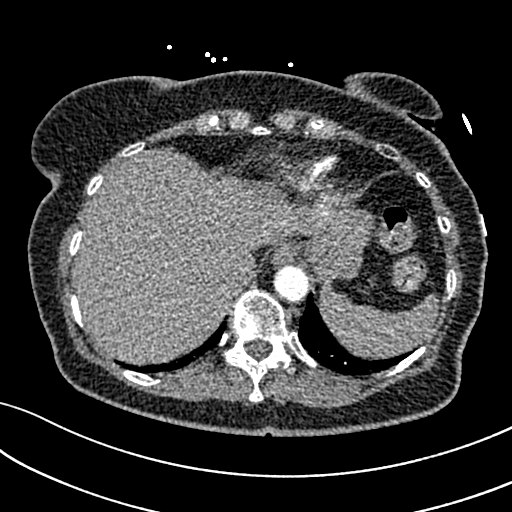
[im 96/314  lung]
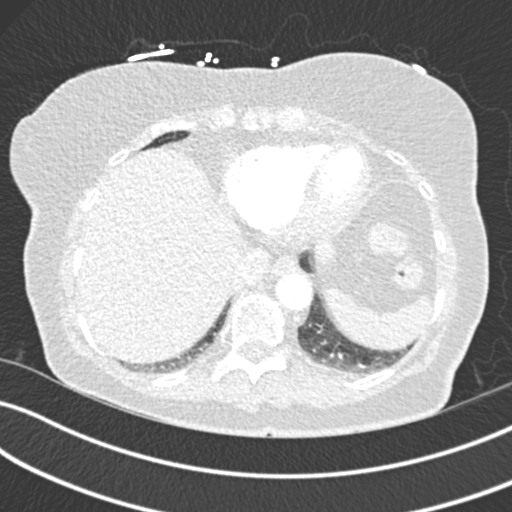
[im 123/314  soft-tissue]
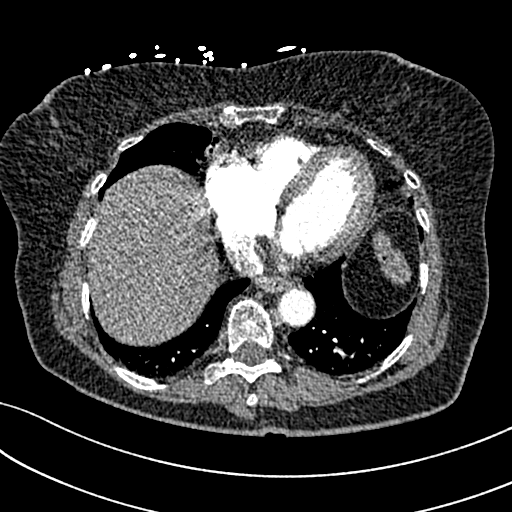
[im 137/314  lung]
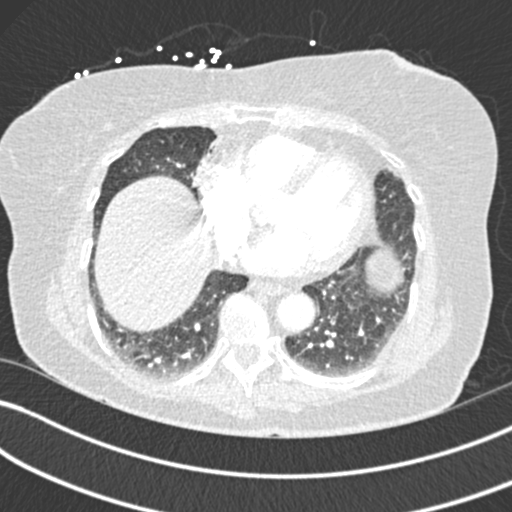
[im 164/314  soft-tissue]
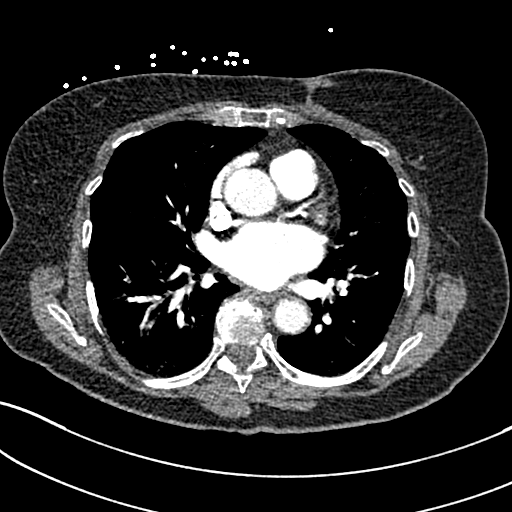
[im 177/314  lung]
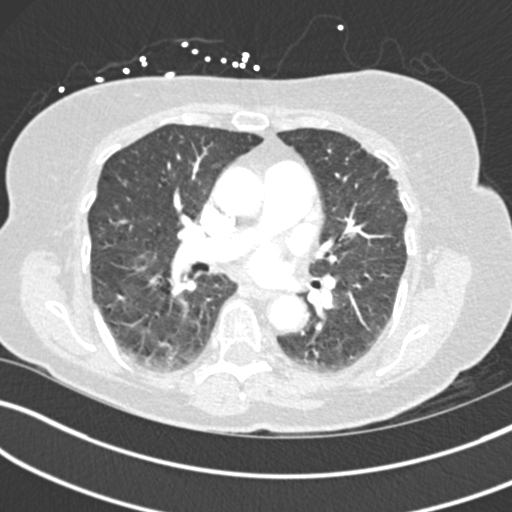
[im 191/314  soft-tissue]
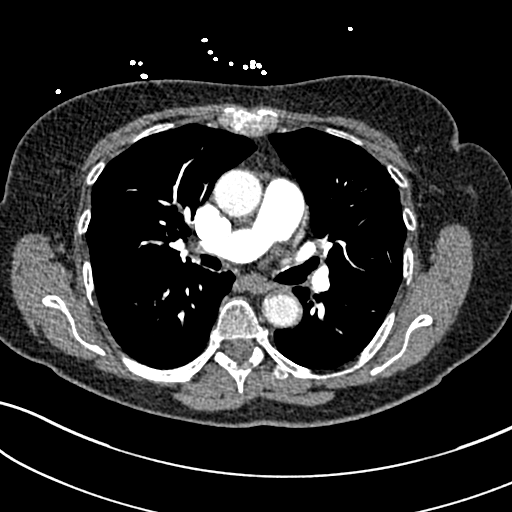
[im 218/314  lung]
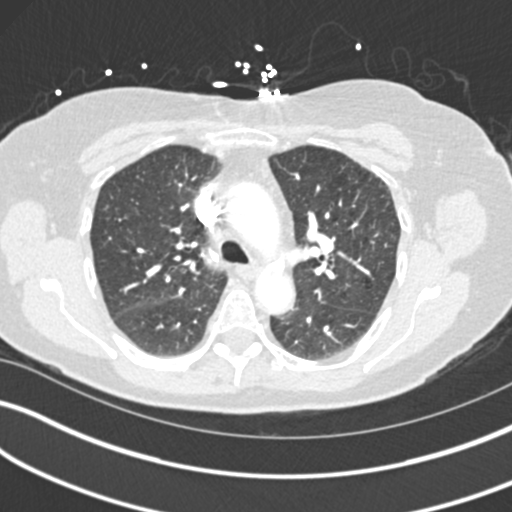
[im 232/314  soft-tissue]
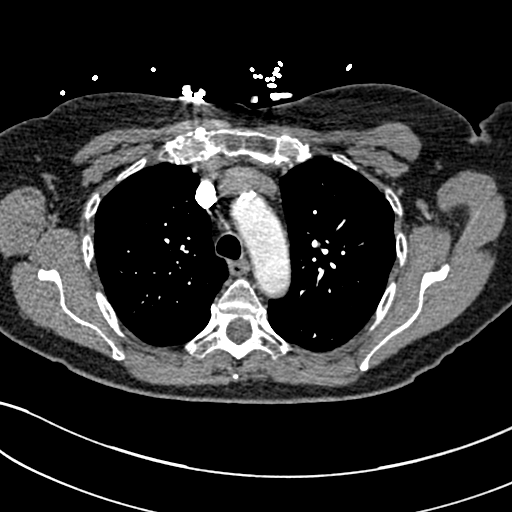
[im 259/314  lung]
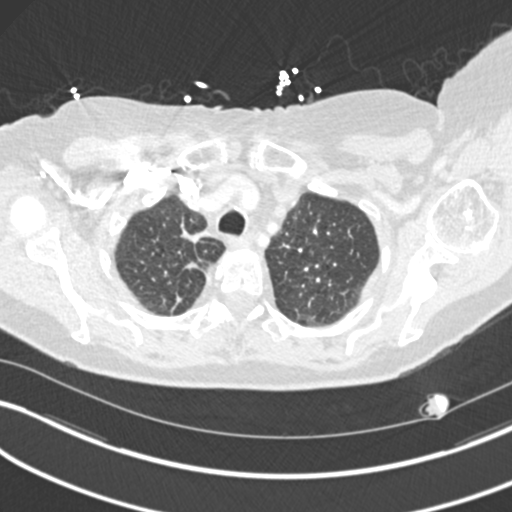
[im 273/314  soft-tissue]
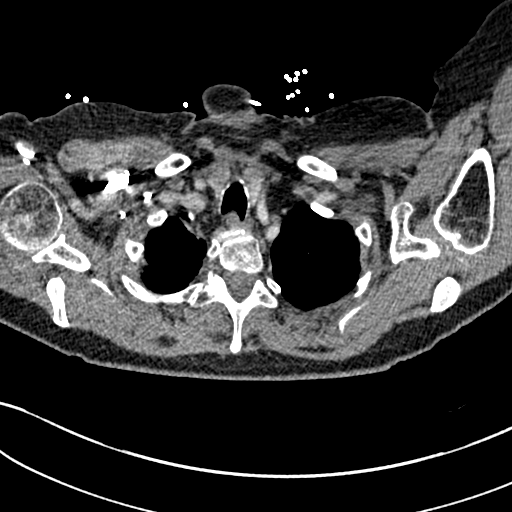
[im 300/314  lung]
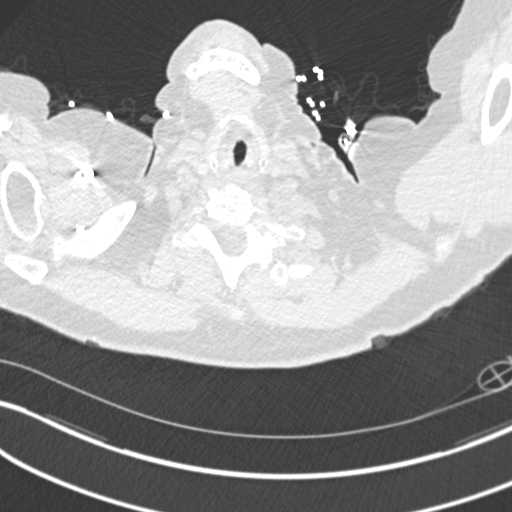

[Series 10: coronal mpr · coronal · 0.68mm/px · 3 of 149 slices shown]
[im 38/149  soft-tissue]
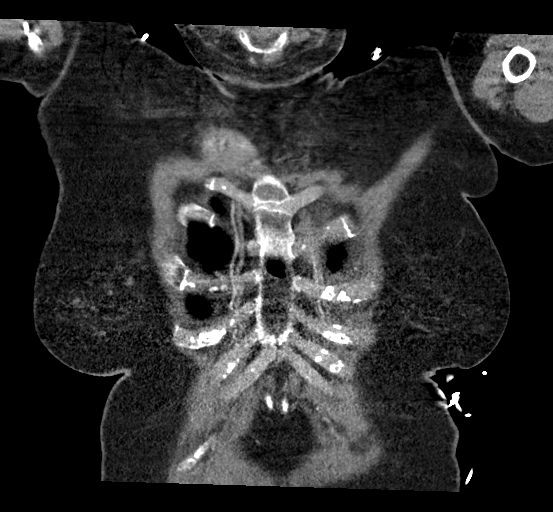
[im 75/149  soft-tissue]
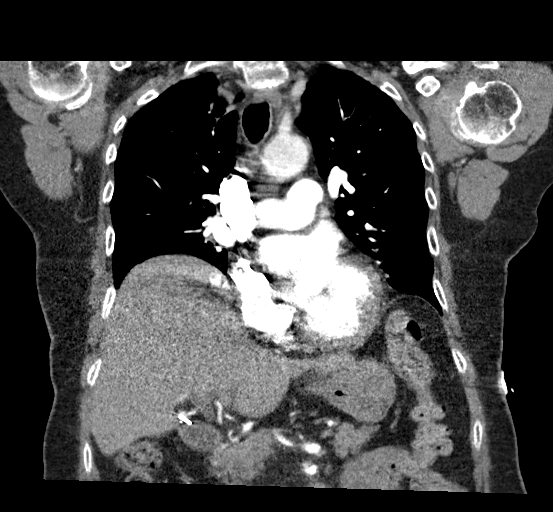
[im 112/149  soft-tissue]
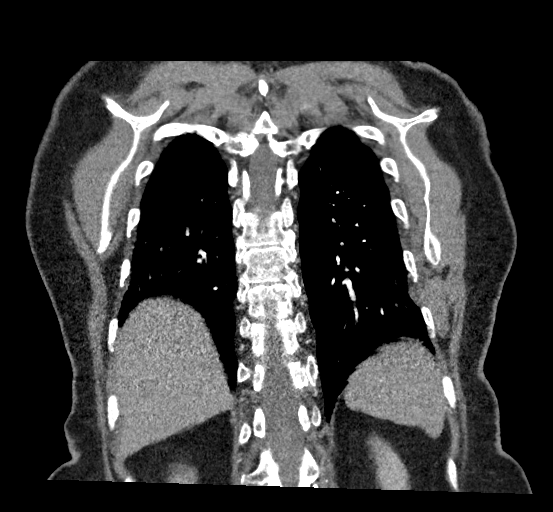

[18 of 46 positions shown; findings below may reference images not displayed]

FINDINGS: Cardiovascular:

Heart:

No cardiomegaly.  No pericardial fluid/thickening.

Calcifications of the left main, left anterior descending, right
coronary arteries.

Aorta:

Calcifications of the aortic arch and branch vessels. Soft plaque of
the descending thoracic aorta. No aneurysm or dissection flap. No
periaortic fluid.

Pulmonary arteries:

No central, lobar, segmental, or proximal subsegmental filling
defects.

Mediastinum/Nodes: Mediastinal lymph nodes are present, none of
which are enlarged by CT size criteria. Unremarkable appearance of
the thoracic esophagus.

Unremarkable appearance of the thyroid.

Lungs/Pleura: Central airways are clear.

No pleural effusion. Linear opacity at the apex of the right lung
without confluent airspace disease favored to be
atelectasis/scarring.

Mild centrilobular emphysema.

No confluent left-sided airspace disease.

No pneumothorax.

Upper Abdomen: Cholecystectomy.  Otherwise unremarkable.

Musculoskeletal: No displaced fracture. Degenerative changes of the
spine. Surgical changes of the left chest wall and breast.

Review of the MIP images confirms the above findings.
IMPRESSION: Study is negative for pulmonary emboli. No acute finding identified.

Three vessel coronary artery disease.  Aortic atherosclerosis.

Early centrilobular emphysema.

## 2016-09-01 IMAGING — DX DG LUMBAR SPINE COMPLETE 4+V
5 series · 5 of 5 positions shown · non-contrast
Comparison: [DATE]

CLINICAL DATA: Lumbago

EXAM:
LUMBAR SPINE - COMPLETE 4+ VIEW

[l-spine ap]
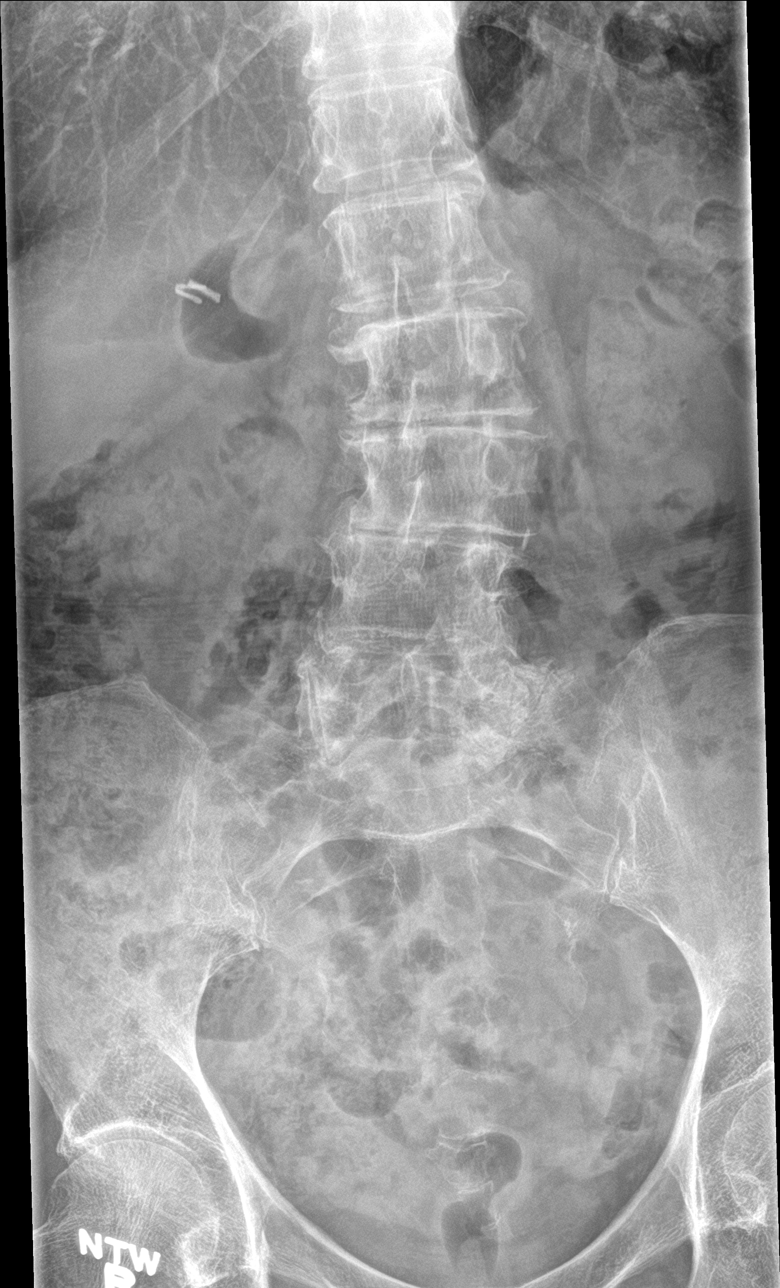

[l-spine obl (1 of 2)]
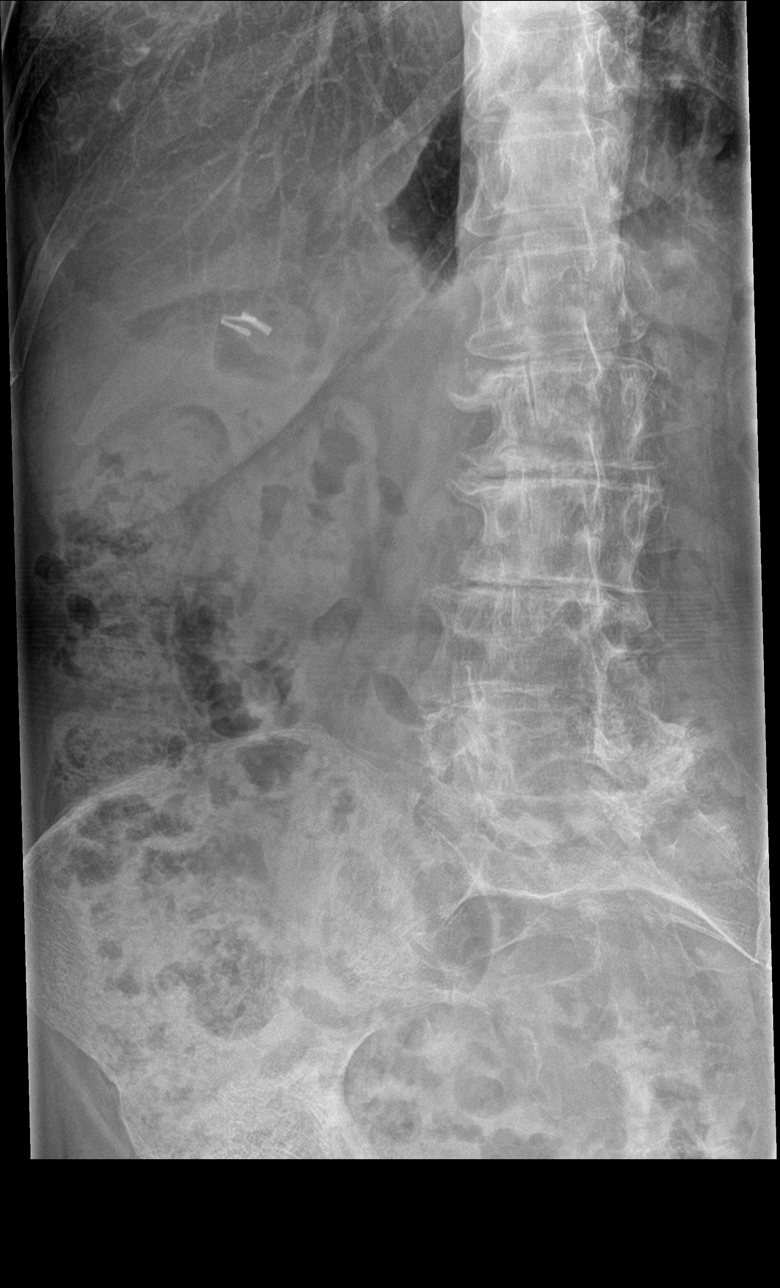

[l-spine obl (2 of 2)]
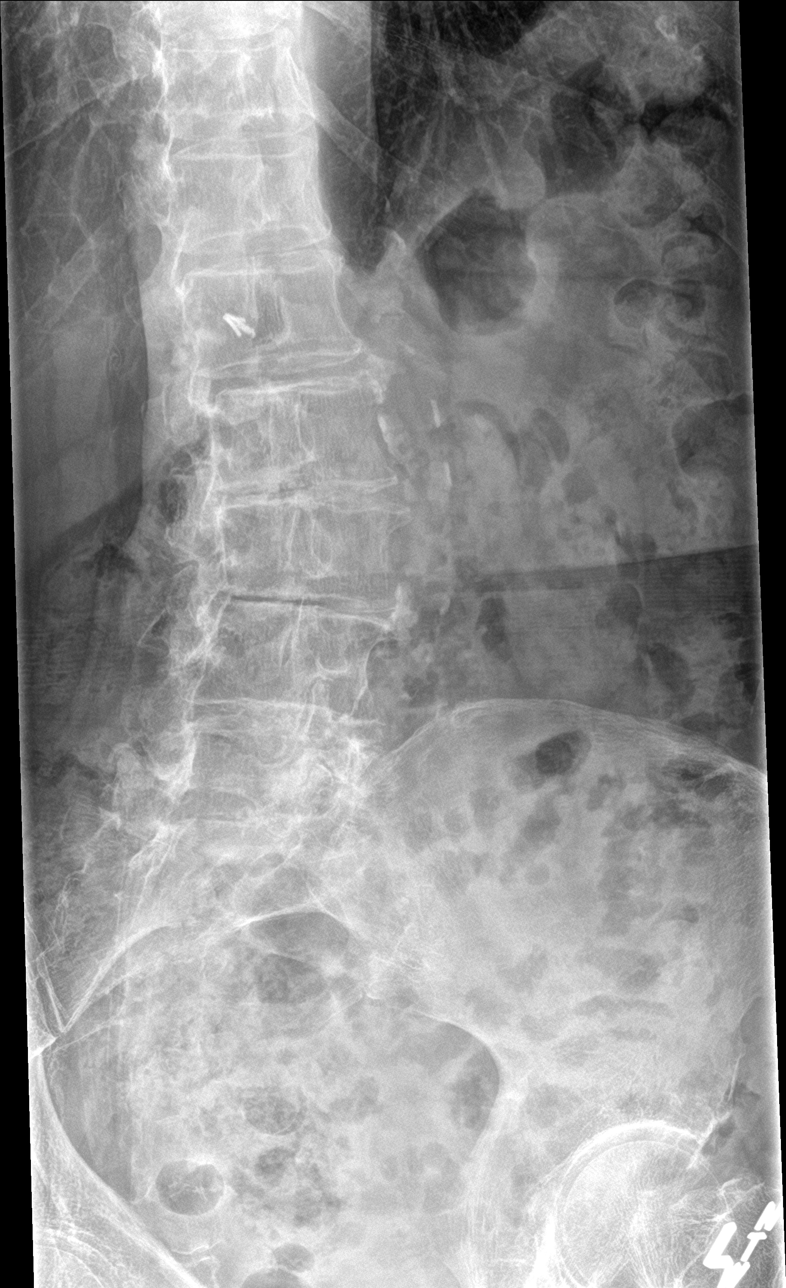

[l-spine lat]
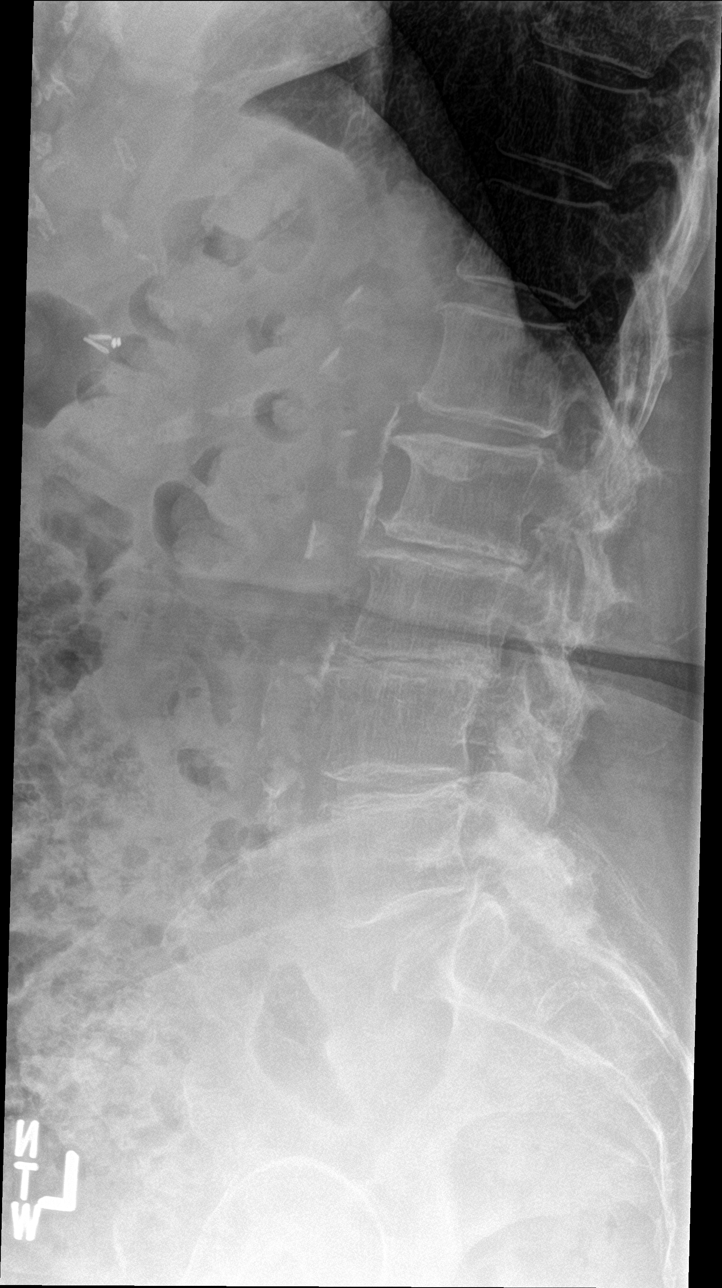

[l-spine spot]
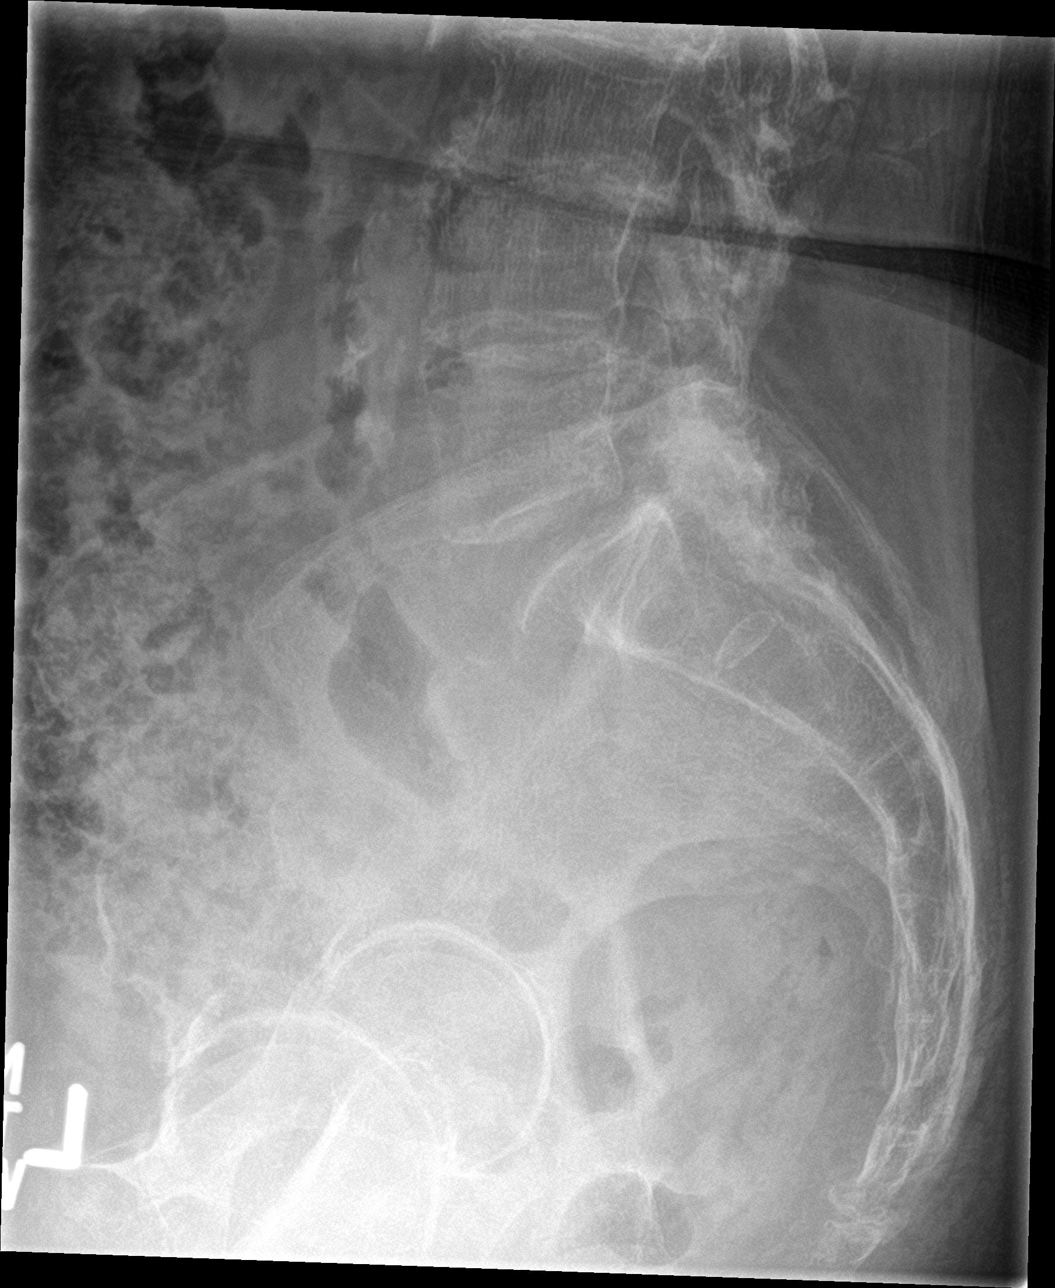

[5 of 5 positions shown; findings below may reference images not displayed]

FINDINGS: Frontal, lateral, spot lumbosacral lateral, and bilateral oblique
views were obtained. There are 5 non-rib-bearing lumbar type
vertebral bodies. There is lumbar levoscoliosis. There is no
fracture. 2 mm of anterolisthesis of L5 on S1 is stable. No new
spondylolisthesis. There is There is moderate disc space narrowing
at L2-3, L3-4, and L4-5 with mild disc space narrowing at L5-S1.
There is facet osteoarthritic change to varying degrees at all
levels bilaterally, most marked at L4-5 and L5-S1 bilaterally. There
is atherosclerotic calcification in the aorta.
IMPRESSION: Scoliosis with multilevel arthropathy. Stable slight
spondylolisthesis at L5-S1 is felt to be due to underlying
spondylosis. There is no new spondylolisthesis. No fracture. There
is aortic atherosclerosis.

## 2016-09-01 MED ORDER — DIPHENHYDRAMINE HCL 50 MG/ML IJ SOLN
12.5000 mg | Freq: Once | INTRAMUSCULAR | Status: AC
  Administered 2016-09-01: 12.5 mg via INTRAVENOUS
  Filled 2016-09-01: qty 1

## 2016-09-01 MED ORDER — IOPAMIDOL (ISOVUE-370) INJECTION 76%
100.0000 mL | Freq: Once | INTRAVENOUS | Status: AC | PRN
  Administered 2016-09-01: 100 mL via INTRAVENOUS

## 2016-09-01 MED ORDER — NAPROXEN 375 MG PO TABS: 375.0000 mg | ORAL_TABLET | Freq: Two times a day (BID) | ORAL | 0 refills | Status: DC

## 2016-09-01 NOTE — ED Notes (Signed)
CT called to state pt had a hard time lying down for scan, reports increased SOB during examine. Upon returning pt is pale, quivering her jaw, and hands are cold to touch. States she feels like she cannot breath and there is a weight on her chest. EKG performed and given to MD Mesner, [place] notified.

## 2016-09-01 NOTE — ED Provider Notes (Signed)
[facility] DEPT Provider Note   CSN: [id number] Arrival date & time: 09/01/16  0908     History   Chief Complaint Chief Complaint  Patient presents with  . Back Pain    HPI Kara Woods is a 82 y.o. female.  HPI  Kara Woods is a 82 y.o. female who presents to the Emergency Department complaining of Worsening left low back pain.  She reports history of back pain "all the time" but pain became worse this morning after bending over to make up her bed. She reports feeling a "pop" in her back and has difficulty walking. She states that she took Tylenol prior to arrival with little to no relief. She also reports shortness of breath for several days. She states that she feels pain to her anterior chest with deep breathing. Her symptoms worsen when lying supine. She also states that she has had to "clear her throat" with associated congestion. She denies fever, chills, chest pain, abdominal pain, and dysuria. She does report history of pneumonia but no previous PE or DVT.  Past Medical History:  Diagnosis Date  . Anxiety   . Arthritis   . Blind left eye   . Breast disorder    cancer left  . Cancer ([place])    left breast   . Colitis   . Cystocele 10/11/2013  . Depression   . GERD (gastroesophageal reflux disease)    occasional Tums only  . HOH (hard of hearing)   . Pelvic relaxation 10/11/2013  . Proctitis    colonsocopy 2007, Canasa suppositories  . S/P endoscopy March 2009   mild erosive reflux esophagitis, Schatzki's ring, s/p dilation  . Schatzki's ring   . Shortness of breath    occ if anxiety  . Wears dentures    upper  . Wears glasses     Patient Active Problem List   Diagnosis Date Noted  . S/P vaginal hysterectomy 05/17/2014  . Cystocele 10/11/2013  . Pelvic relaxation 10/11/2013  . Cancer of left breast ([place]) 04/20/2013  . Diarrhea 06/23/2012  . Schatzki's ring 07/16/2011  . COLLES' FRACTURE, LEFT 04/04/2010  . CHEST PAIN UNSPECIFIED 09/18/2009    . HYPERLIPIDEMIA 07/28/2008  . ANXIETY 07/28/2008  . MACULAR DEGENERATION, BILATERAL 07/28/2008  . CAD 07/28/2008  . GERD 07/28/2008  . PROCTITIS 07/28/2008  . OSTEOPENIA 07/28/2008  . RETINAL DETACHMENT, LEFT EYE, HX OF 07/28/2008    Past Surgical History:  Procedure Laterality Date  . ANTERIOR AND POSTERIOR REPAIR N/A 05/17/2014   Procedure: ANTERIOR (CYSTOCELE) AND POSTERIOR REPAIR (RECTOCELE);  Surgeon: Reece Packer, MD;  Location: [facility];  Service: Urology;  Laterality: N/A;  . BREAST LUMPECTOMY WITH NEEDLE LOCALIZATION AND AXILLARY SENTINEL LYMPH NODE BX Left 05/03/2013   Procedure: BREAST LUMPECTOMY WITH NEEDLE LOCALIZATION AND AXILLARY SENTINEL LYMPH NODE BX;  Surgeon: Edward Jolly, MD;  Location: [facility];  Service: General;  Laterality: Left;  . BREAST SURGERY Left 05/19/13  . CARDIAC CATHETERIZATION  1998  . COLONOSCOPY  05/06/2006   Diffuse inflammatory changes of the rectal mucosa, consistent  with proctitis.  Otherwise, normal colon to terminal ileum  . CYSTOSCOPY N/A 05/17/2014   Procedure: CYSTOSCOPY;  Surgeon: Reece Packer, MD;  Location: [facility];  Service: Urology;  Laterality: N/A;  . ESOPHAGOGASTRODUODENOSCOPY  02/09/2008   Schatzki ring status post dilation/Distal esophageal erosion consistent with mild erosive reflux  esophagitis, otherwise unremarkable esophagus, normal stomach, D1, D2.  . EYE SURGERY  lt cataract-implant  . EYE SURGERY     lt-lens repaced,l  . [place] SURGERY  1993  . RE-EXCISION OF BREAST LUMPECTOMY Left 05/19/2013   Procedure: RE-EXCISION OF BREAST LUMPECTOMY;  Surgeon: Edward Jolly, MD;  Location: [facility];  Service: General;  Laterality: Left;  . RETINAL DETACHMENT SURGERY  1978   Left  . VAGINAL HYSTERECTOMY Bilateral 05/17/2014   Procedure: HYSTERECTOMY VAGINAL with Bilateral Salpingo-Oophorectomy; Bladder cystotomy repair;  Surgeon: Marvene Staff, MD;  Location: [facility];  Service: Gynecology;  Laterality: Bilateral;  . VAGINAL PROLAPSE REPAIR N/A 05/17/2014   Procedure: VAGINAL VAULT PROLAPSE AND GRAFT;  Surgeon: Reece Packer, MD;  Location: [facility];  Service: Urology;  Laterality: N/A;    OB History    Gravida Para Term Preterm AB Living   6 6       5    SAB TAB Ectopic Multiple Live Births           6       Home Medications    Prior to Admission medications   Medication Sig Start Date End Date Taking? Authorizing Provider  acetaminophen (TYLENOL) 325 MG tablet Take 650 mg by mouth every 6 (six) hours as needed.      Historical Provider, MD  acetaminophen (TYLENOL) 500 MG tablet Take 500-1,000 mg by mouth every 6 (six) hours as needed for mild pain.    Historical Provider, MD  clorazepate (TRANXENE) 7.5 MG tablet Take 7.5 mg by mouth 2 (two) times daily as needed for anxiety.    Historical Provider, MD  Travoprost, BAK Free, (TRAVATAN) 0.004 % SOLN ophthalmic solution Place 1 drop into both eyes at bedtime.    Historical Provider, MD    Family History Family History  Problem Relation Age of Onset  . Cancer Brother     spinal  . Colon cancer Neg Hx     Social History Social History  Substance Use Topics  . Smoking status: Former Smoker    Packs/day: 1.00    Types: Cigarettes    Quit date: 04/28/1990  . Smokeless tobacco: Never Used     Comment: quit 19 yrs ago  . Alcohol use Yes     Comment: a glass of wine once or twice a week     Allergies   Sulfonamide derivatives   Review of Systems Review of Systems  Constitutional: Negative for appetite change, chills, fatigue and fever.  HENT: Positive for congestion. Negative for sore throat and trouble swallowing.   Respiratory: Positive for shortness of breath. Negative for chest tightness and wheezing.   Cardiovascular: Negative for chest pain, palpitations and leg swelling.  Gastrointestinal: Negative for abdominal pain, nausea and vomiting.  Genitourinary: Positive for  frequency. Negative for dysuria.  Musculoskeletal: Positive for back pain.  Skin: Negative for color change and rash.  Neurological: Negative for dizziness, speech difficulty, weakness, numbness and headaches.  Psychiatric/Behavioral: Negative for confusion.     Physical Exam Updated Vital Signs BP 126/96 (BP Location: Left Arm)   Pulse 75   Temp 98 F (36.7 C) (Oral)   Resp 18   Ht 5\' 6"  (1.676 m)   Wt 72.6 kg   SpO2 98%   BMI 25.82 kg/m   Physical Exam  Constitutional: She is oriented to person, place, and time. She appears well-developed and well-nourished. No distress.  HENT:  Head: Normocephalic.  Mouth/Throat: Oropharynx is clear and moist.  Eyes:  Patient is blind left eye  Neck:  Normal range of motion. Neck supple. No JVD present.  Cardiovascular: Normal rate, regular rhythm and intact distal pulses.   Pulmonary/Chest: Effort normal and breath sounds normal. No respiratory distress. She has no wheezes. She has no rales.  Abdominal: Soft. She exhibits no distension. There is no tenderness.  Musculoskeletal: Normal range of motion. She exhibits no edema.       Lumbar back: She exhibits tenderness. She exhibits no bony tenderness.       Back:  Tenderness to palpation of the lower lumbar spine and left lumbar paraspinal muscles.  5 out of 5 strength against resistance of the bilateral lower extremities  Lymphadenopathy:    She has no cervical adenopathy.  Neurological: She is alert and oriented to person, place, and time.  Skin: Skin is warm and dry. No rash noted.  Psychiatric: She has a normal mood and affect.  Nursing note and vitals reviewed.    ED Treatments / Results  Labs (all labs ordered are listed, but only abnormal results are displayed) Labs Reviewed  BASIC METABOLIC PANEL - Abnormal; Notable for the following:       Result Value   Calcium 8.6 (*)    Anion gap 3 (*)    All other components within normal limits  URINALYSIS, ROUTINE W REFLEX  MICROSCOPIC (NOT AT [facility]) - Abnormal; Notable for the following:    Specific Gravity, Urine <1.005 (*)    All other components within normal limits  D-DIMER, QUANTITATIVE (NOT AT [facility]) - Abnormal; Notable for the following:    D-Dimer, Quant 0.65 (*)    All other components within normal limits  TROPONIN I  CBC WITH DIFFERENTIAL/PLATELET  BRAIN NATRIURETIC PEPTIDE    EKG  EKG Interpretation  Date/Time:  Sunday September 01 2016 12:07:34 EDT Ventricular Rate:  92 PR Interval:    QRS Duration: 82 QT Interval:  380 QTC Calculation: 471 R Axis:   120 Text Interpretation:  Right and left arm electrode reversal, interpretation assumes no reversal Sinus rhythm Probable lateral infarct, old Confirmed by [facility] MD, JASON [phone]) on 09/01/2016 12:14:03 PM       Radiology Dg Chest 2 View  Result Date: 09/01/2016 CLINICAL DATA:  Shortness of breath for 3 months EXAM: CHEST  2 VIEW COMPARISON:  January 23, 2016 FINDINGS: There is slight scarring in the right apex medially. Lungs elsewhere clear. Heart size and pulmonary vascularity are normal. No adenopathy. There is atherosclerotic calcification in the aorta. No adenopathy. No bone lesions. IMPRESSION: Mild scarring right apex medially. No edema or consolidation. There is aortic atherosclerosis. Electronically Signed   By: Lowella Grip III M.D.   On: 09/01/2016 10:05   Dg Lumbar Spine Complete  Result Date: 09/01/2016 CLINICAL DATA:  Lumbago EXAM: LUMBAR SPINE - COMPLETE 4+ VIEW COMPARISON:  September 28, 2012 FINDINGS: Frontal, lateral, spot lumbosacral lateral, and bilateral oblique views were obtained. There are 5 non-rib-bearing lumbar type vertebral bodies. There is lumbar levoscoliosis. There is no fracture. 2 mm of anterolisthesis of L5 on S1 is stable. No new spondylolisthesis. There is There is moderate disc space narrowing at L2-3, L3-4, and L4-5 with mild disc space narrowing at L5-S1. There is facet osteoarthritic change to varying  degrees at all levels bilaterally, most marked at L4-5 and L5-S1 bilaterally. There is atherosclerotic calcification in the aorta. IMPRESSION: Scoliosis with multilevel arthropathy. Stable slight spondylolisthesis at L5-S1 is felt to be due to underlying spondylosis. There is no new spondylolisthesis. No fracture. There is aortic atherosclerosis. Electronically  Signed   By: Lowella Grip III M.D.   On: 09/01/2016 10:08   Ct Angio Chest Pe W And/or Wo Contrast  Result Date: 09/01/2016 CLINICAL DATA:  82 year old female with a history of shortness of breath for 2-3 weeks. D-dimer 65. EXAM: CT ANGIOGRAPHY CHEST WITH CONTRAST TECHNIQUE: Multidetector CT imaging of the chest was performed using the standard protocol during bolus administration of intravenous contrast. Multiplanar CT image reconstructions and MIPs were obtained to evaluate the vascular anatomy. CONTRAST:  100 cc Isovue 370 COMPARISON:  CT abdomen 05/28/2014 FINDINGS: Cardiovascular: Heart: No cardiomegaly.  No pericardial fluid/thickening. Calcifications of the left main, left anterior descending, right coronary arteries. Aorta: Calcifications of the aortic arch and branch vessels. Soft plaque of the descending thoracic aorta. No aneurysm or dissection flap. No periaortic fluid. Pulmonary arteries: No central, lobar, segmental, or proximal subsegmental filling defects. Mediastinum/Nodes: Mediastinal lymph nodes are present, none of which are enlarged by CT size criteria. Unremarkable appearance of the thoracic esophagus. Unremarkable appearance of the thyroid. Lungs/Pleura: Central airways are clear. No pleural effusion. Linear opacity at the apex of the right lung without confluent airspace disease favored to be atelectasis/scarring. Mild centrilobular emphysema. No confluent left-sided airspace disease. No pneumothorax. Upper Abdomen: Cholecystectomy.  Otherwise unremarkable. Musculoskeletal: No displaced fracture. Degenerative changes of the  spine. Surgical changes of the left chest wall and breast. Review of the MIP images confirms the above findings. IMPRESSION: Study is negative for pulmonary emboli. No acute finding identified. Three vessel coronary artery disease.  Aortic atherosclerosis. Early centrilobular emphysema. Signed, Dulcy Fanny. Earleen Newport, DO [facility] Radiology Electronically Signed   By: Corrie Mckusick D.O.   On: 09/01/2016 13:00    Procedures Procedures (including critical care time)  Medications Ordered in ED Medications  iopamidol (ISOVUE-370) 76 % injection 100 mL (100 mLs Intravenous Contrast Given 09/01/16 1131)  diphenhydrAMINE (BENADRYL) injection 12.5 mg (12.5 mg Intravenous Given 09/01/16 1227)     Initial Impression / Assessment and Plan / ED Course  I have reviewed the triage vital signs and the nursing notes.  Pertinent labs & imaging results that were available during my care of the patient were reviewed by me and considered in my medical decision making (see chart for details).  Clinical Course   Patient also seen by Dr. Dayna Barker and care plan discussed.  1220  I was notified by nursing that patient has returned from CT and appeared anxious, shaking all over, tearful and complains of chest tightness. Vitals remain stable.  No hypoxia.  Repeat EKG obtained and appears similar to prior.  Received IV contrast and symptoms likely related to intolerance.  Will give IV Benadryl and observe  Airway remains patent.  No rash, no edema.    1315  Patient is feeling better and requesting to go home.  Tolerating oral fluids and eating crackers.  Sitting on bed upon recheck.  She appears stable for d/c.  Strict return precautions discussed as well as need for PMD and cards f/u for possible echo  Final Clinical Impressions(s) / ED Diagnoses   Final diagnoses:  Strain of lumbar region, initial encounter  Dyspnea, unspecified type    New Prescriptions New  Prescriptions   No medications on file     Kem Parkinson, Hershal Coria 09/02/16 2243    Merrily Pew, MD 09/04/16 [phone]

## 2016-09-01 NOTE — Discharge Instructions (Signed)
Call Dr. Ria Comment office tomorrow to arrange a follow-up appt.  Apply ice packs on/off to your back.  Return to ER for any worsening symptoms

## 2016-09-01 NOTE — ED Notes (Signed)
Patient transported to X-ray 

## 2016-09-01 NOTE — ED Triage Notes (Signed)
Patient c/o mid-low left side back pain that started this morning while bending over to straighten sheets. Patient states she "felt a pop in her back" Patient states that PCP told her she has a "crooked back." Per patient took 1500mg  of tylenol an hour ago. Patient also states that she is concerned about her breathing. Per patient has had a hard time taking a deep breathing.

## 2016-09-01 NOTE — ED Notes (Signed)
Patient transported to CT 

## 2016-09-01 NOTE — ED Notes (Signed)
Pt still in CT

## 2016-09-01 NOTE — ED Notes (Signed)
PA Triplett at bedside.

## 2016-09-01 NOTE — ED Provider Notes (Signed)
Medical screening examination/treatment/procedure(s) were conducted as a shared visit with non-physician practitioner(s) and myself.  I personally evaluated the patient during the encounter.  82 year old female here with 2 complaints. Main complaint: acute onset of left lateral back pain while making her bed. No ttp in that area now. No rash present. Normal neuro exam below the area. Likely muscular.  Also with a couple months of intermittent short of breath. Described as just need to take a deep breath once a while. Sometimes he'll get blurry vision in her 1 eyes with this. Some adjustment tingling fingers. Sometimes she'll have a distended abdomen associated with it and does had decreased ability to sleep flat on her back compared to previously. On exam now she has clear lungs normal heart sounds no lower extremity edema. Plan to evaluate this with the d-dimer, BNP, troponin, EKG and chest x-ray. If these are acceptable patient can continue follow-up with her primary doctor and address this issue likely needs ultrasound.   EKG Interpretation  Date/Time:  Sunday September 01 2016 12:07:34 EDT Ventricular Rate:  92 PR Interval:    QRS Duration: 82 QT Interval:  380 QTC Calculation: 471 R Axis:   120 Text Interpretation:  Right and left arm electrode reversal, interpretation assumes no reversal Sinus rhythm Probable lateral infarct, old Confirmed by [facility] MD, Jezelle Gullick [phone]) on 09/01/2016 12:14:03 PM         Merrily Pew, MD 09/01/16 1600

## 2016-09-05 DIAGNOSIS — M545 Low back pain: Secondary | ICD-10-CM | POA: Diagnosis not present

## 2016-09-05 DIAGNOSIS — Z23 Encounter for immunization: Secondary | ICD-10-CM | POA: Diagnosis not present

## 2016-09-05 DIAGNOSIS — R0602 Shortness of breath: Secondary | ICD-10-CM | POA: Diagnosis not present

## 2016-09-10 DIAGNOSIS — H1851 Endothelial corneal dystrophy: Secondary | ICD-10-CM | POA: Diagnosis not present

## 2016-09-10 DIAGNOSIS — H1820 Unspecified corneal edema: Secondary | ICD-10-CM | POA: Diagnosis not present

## 2016-09-10 DIAGNOSIS — H02052 Trichiasis without entropian right lower eyelid: Secondary | ICD-10-CM | POA: Diagnosis not present

## 2016-09-10 DIAGNOSIS — H401131 Primary open-angle glaucoma, bilateral, mild stage: Secondary | ICD-10-CM | POA: Diagnosis not present

## 2016-09-10 DIAGNOSIS — H04123 Dry eye syndrome of bilateral lacrimal glands: Secondary | ICD-10-CM | POA: Diagnosis not present

## 2016-09-26 ENCOUNTER — Other Ambulatory Visit (HOSPITAL_COMMUNITY): Payer: Self-pay | Admitting: Internal Medicine

## 2016-09-26 ENCOUNTER — Ambulatory Visit (HOSPITAL_COMMUNITY)
Admission: RE | Admit: 2016-09-26 | Discharge: 2016-09-26 | Disposition: A | Discharge status: Home / Self Care | Payer: Medicare Other | Source: Ambulatory Visit | Attending: Internal Medicine | Admitting: Internal Medicine

## 2016-09-26 DIAGNOSIS — R0602 Shortness of breath: Secondary | ICD-10-CM

## 2016-09-26 IMAGING — DX DG CHEST 2V
2 series · 2 of 2 positions shown · non-contrast
Comparison: CT [DATE].

CLINICAL DATA: Shortness of breath.

EXAM:
CHEST  2 VIEW

[chest pa]
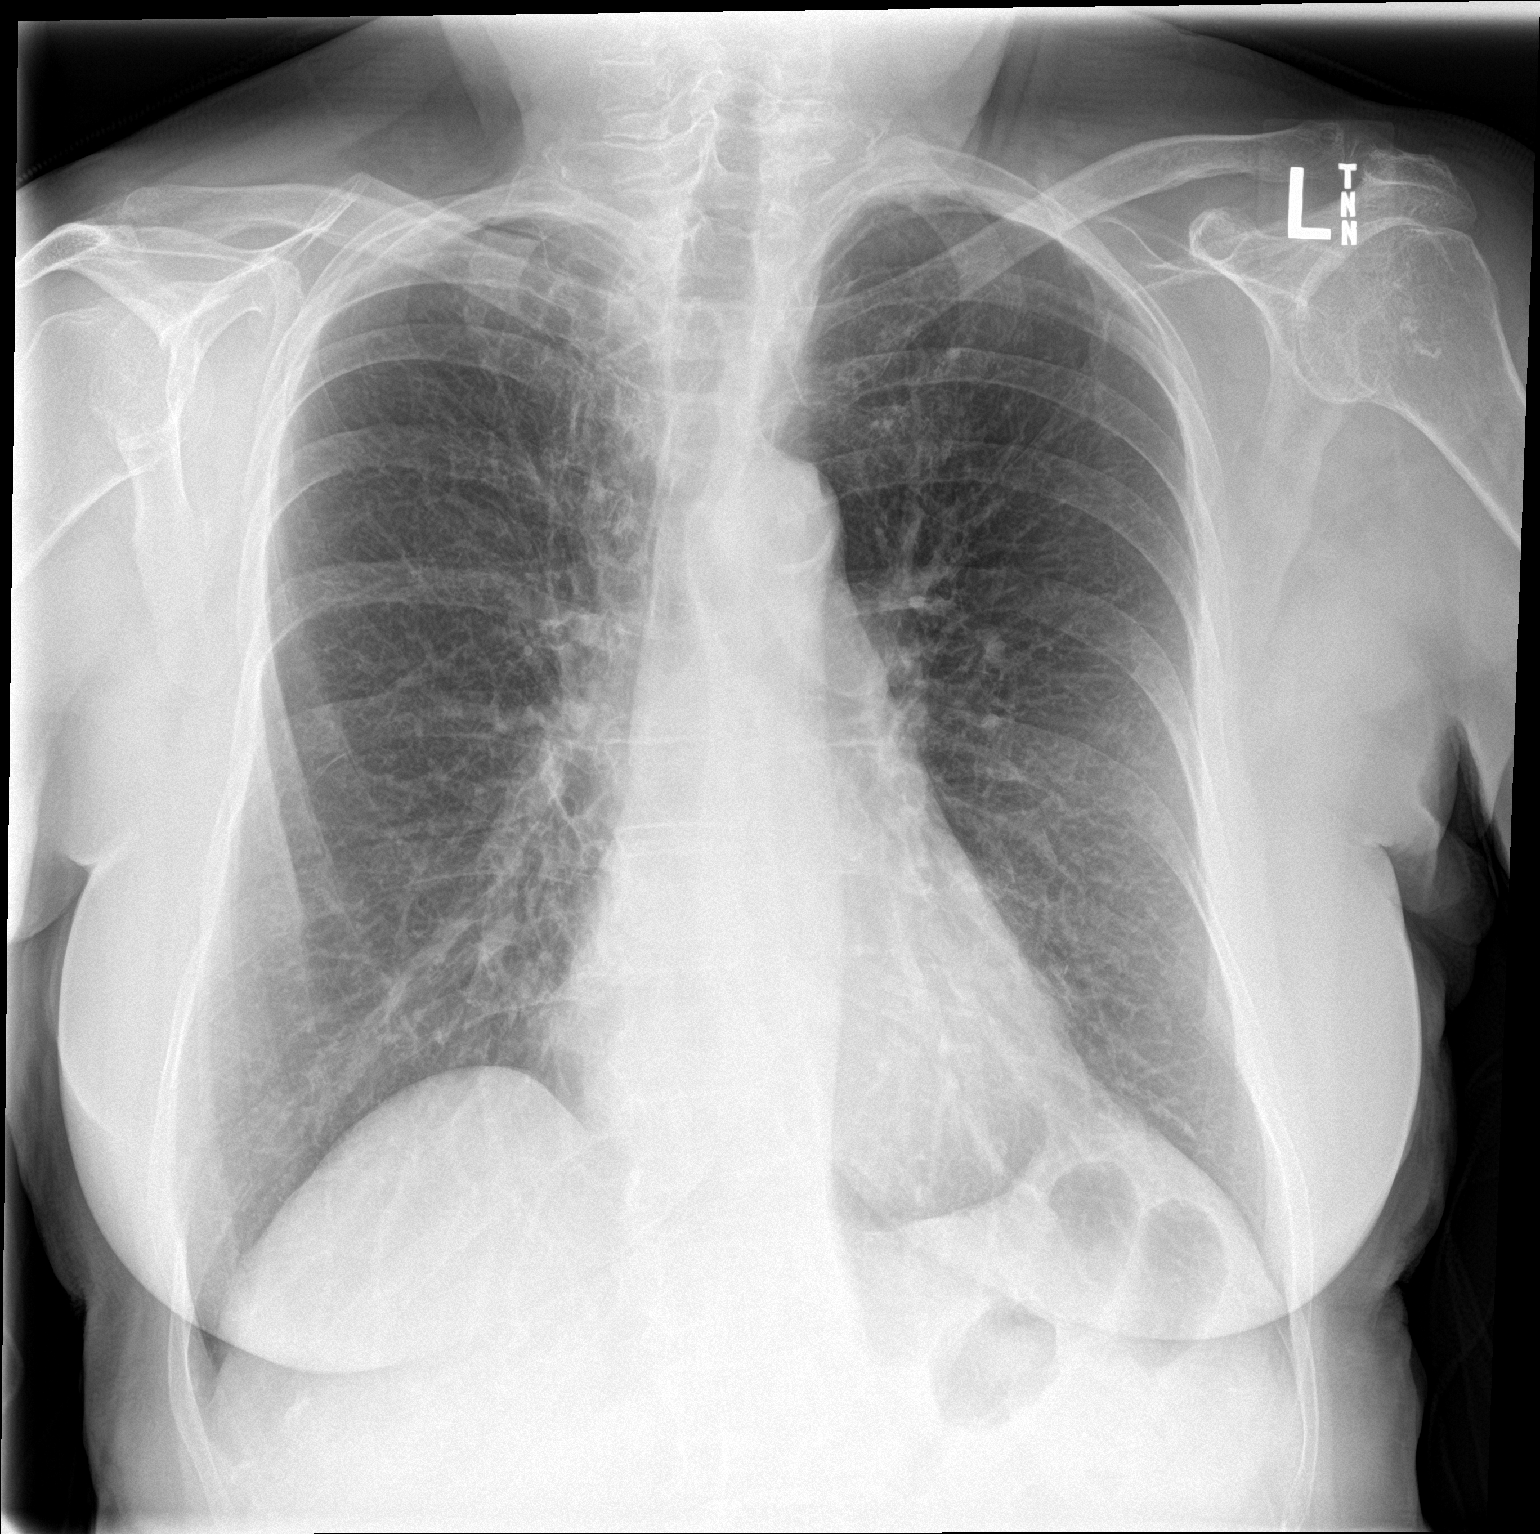

[chest lat]
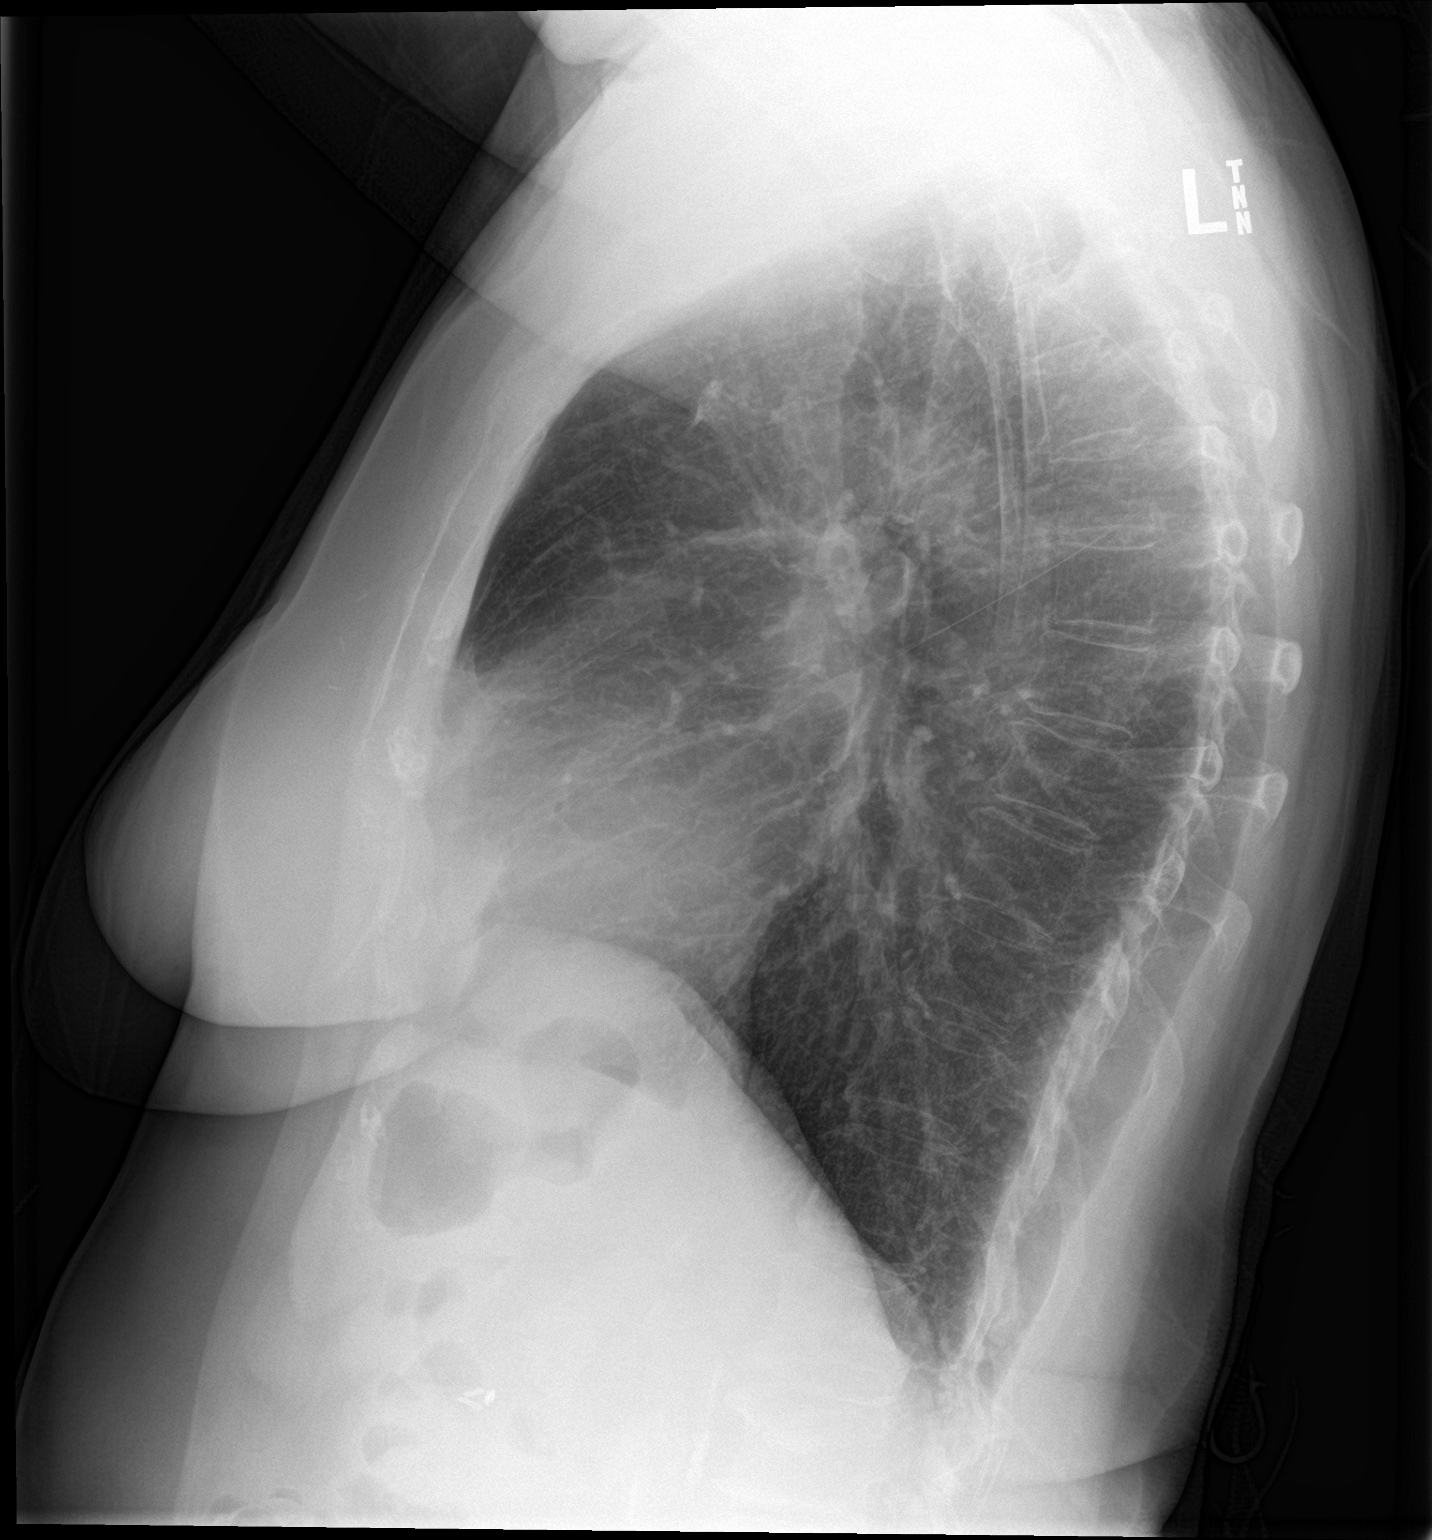

[2 of 2 positions shown; findings below may reference images not displayed]

FINDINGS: Mediastinum and hilar structures are normal. Lungs are clear of
acute infiltrates. No pleural effusion or pneumothorax. Diffuse
osteopenia and degenerative change thoracic spine. No acute bony
abnormality. Surgical clips upper abdomen.
IMPRESSION: No acute cardiopulmonary disease .

## 2016-10-09 DIAGNOSIS — J449 Chronic obstructive pulmonary disease, unspecified: Secondary | ICD-10-CM | POA: Diagnosis not present

## 2016-10-09 DIAGNOSIS — R0602 Shortness of breath: Secondary | ICD-10-CM | POA: Diagnosis not present

## 2016-10-16 DIAGNOSIS — J449 Chronic obstructive pulmonary disease, unspecified: Secondary | ICD-10-CM | POA: Diagnosis not present

## 2016-10-16 DIAGNOSIS — R0602 Shortness of breath: Secondary | ICD-10-CM | POA: Diagnosis not present

## 2016-10-16 LAB — PULMONARY FUNCTION TEST

## 2016-10-21 ENCOUNTER — Other Ambulatory Visit (HOSPITAL_COMMUNITY): Payer: Self-pay | Admitting: Pulmonary Disease

## 2016-10-21 ENCOUNTER — Other Ambulatory Visit (HOSPITAL_COMMUNITY): Payer: Self-pay | Admitting: Respiratory Therapy

## 2016-10-21 DIAGNOSIS — R0602 Shortness of breath: Secondary | ICD-10-CM

## 2016-10-21 DIAGNOSIS — J441 Chronic obstructive pulmonary disease with (acute) exacerbation: Secondary | ICD-10-CM

## 2016-10-23 ENCOUNTER — Ambulatory Visit (HOSPITAL_COMMUNITY): Payer: Medicare Other

## 2016-10-24 ENCOUNTER — Ambulatory Visit (HOSPITAL_COMMUNITY)
Admission: RE | Admit: 2016-10-24 | Discharge: 2016-10-24 | Disposition: A | Discharge status: Home / Self Care | Payer: Medicare Other | Source: Ambulatory Visit | Attending: Pulmonary Disease | Admitting: Pulmonary Disease

## 2016-10-24 DIAGNOSIS — R0602 Shortness of breath: Secondary | ICD-10-CM | POA: Insufficient documentation

## 2016-10-24 DIAGNOSIS — I5189 Other ill-defined heart diseases: Secondary | ICD-10-CM | POA: Insufficient documentation

## 2016-10-24 DIAGNOSIS — I517 Cardiomegaly: Secondary | ICD-10-CM | POA: Insufficient documentation

## 2016-10-24 DIAGNOSIS — I340 Nonrheumatic mitral (valve) insufficiency: Secondary | ICD-10-CM | POA: Diagnosis not present

## 2016-10-24 LAB — ECHOCARDIOGRAM COMPLETE
E decel time: 333 msec
E/e' ratio: 9.76
FS: 29 % (ref 28–44)
IVS/LV PW RATIO, ED: 0.96
LA ID, A-P, ES: 37 mm
LA diam end sys: 37 mm
LA diam index: 2.03 cm/m2
LA vol A4C: 33.9 ml
LA vol index: 26.6 mL/m2
LA vol: 48.4 mL
LV E/e' medial: 9.76
LV E/e'average: 9.76
LV PW d: 14.2 mm — AB (ref 0.6–1.1)
LV e' LATERAL: 5.44 cm/s
LVOT area: 3.46 cm2
LVOT diameter: 21 mm
MV Dec: 333
MV pk A vel: 85.3 m/s
MV pk E vel: 53.1 m/s
TAPSE: 16.1 mm
TDI e' lateral: 5.44
TDI e' medial: 4.03

## 2016-10-24 NOTE — Progress Notes (Signed)
*  PRELIMINARY RESULTS* Echocardiogram 2D Echocardiogram has been performed.  Kara Woods 10/24/2016, 2:35 PM

## 2016-10-28 ENCOUNTER — Telehealth: Payer: Self-pay | Admitting: Cardiovascular Disease

## 2016-10-28 DIAGNOSIS — F411 Generalized anxiety disorder: Secondary | ICD-10-CM | POA: Diagnosis not present

## 2016-10-28 DIAGNOSIS — R0602 Shortness of breath: Secondary | ICD-10-CM | POA: Diagnosis not present

## 2016-10-28 NOTE — Telephone Encounter (Signed)
Received records from Dr Sinda Du for appointment on 10/31/16 with Dr [name].  Records given to [facility] (medical records) for Dr Evette Georges schedule on 10/31/16. lp

## 2016-10-31 ENCOUNTER — Encounter: Payer: Self-pay | Admitting: Cardiovascular Disease

## 2016-10-31 ENCOUNTER — Ambulatory Visit (INDEPENDENT_AMBULATORY_CARE_PROVIDER_SITE_OTHER): Payer: Medicare Other | Admitting: Cardiovascular Disease

## 2016-10-31 VITALS — BP 129/81 | HR 74 | Ht 66.5 in | Wt 169.2 lb

## 2016-10-31 DIAGNOSIS — R0609 Other forms of dyspnea: Secondary | ICD-10-CM

## 2016-10-31 DIAGNOSIS — R079 Chest pain, unspecified: Secondary | ICD-10-CM

## 2016-10-31 DIAGNOSIS — Z13220 Encounter for screening for lipoid disorders: Secondary | ICD-10-CM | POA: Diagnosis not present

## 2016-10-31 DIAGNOSIS — Z9889 Other specified postprocedural states: Secondary | ICD-10-CM

## 2016-10-31 DIAGNOSIS — Z853 Personal history of malignant neoplasm of breast: Secondary | ICD-10-CM

## 2016-10-31 DIAGNOSIS — Z8669 Personal history of other diseases of the nervous system and sense organs: Secondary | ICD-10-CM

## 2016-10-31 DIAGNOSIS — Z01818 Encounter for other preprocedural examination: Secondary | ICD-10-CM | POA: Diagnosis not present

## 2016-10-31 DIAGNOSIS — Z7901 Long term (current) use of anticoagulants: Secondary | ICD-10-CM

## 2016-10-31 DIAGNOSIS — R0789 Other chest pain: Secondary | ICD-10-CM

## 2016-10-31 DIAGNOSIS — R0600 Dyspnea, unspecified: Secondary | ICD-10-CM

## 2016-10-31 LAB — CBC
HCT: 42.8 % (ref 35.0–45.0)
Hemoglobin: 13.8 g/dL (ref 11.7–15.5)
MCH: 30.1 pg (ref 27.0–33.0)
MCHC: 32.2 g/dL (ref 32.0–36.0)
MCV: 93.4 fL (ref 80.0–100.0)
MPV: 10.7 fL (ref 7.5–12.5)
Platelets: 270 10*3/uL (ref 140–400)
RBC: 4.58 MIL/uL (ref 3.80–5.10)
RDW: 14 % (ref 11.0–15.0)
WBC: 8.8 10*3/uL (ref 3.8–10.8)

## 2016-10-31 LAB — COMPREHENSIVE METABOLIC PANEL WITH GFR
ALT: 10 U/L (ref 6–29)
AST: 17 U/L (ref 10–35)
Albumin: 4.3 g/dL (ref 3.6–5.1)
Alkaline Phosphatase: 55 U/L (ref 33–130)
BUN: 17 mg/dL (ref 7–25)
CO2: 29 mmol/L (ref 20–31)
Calcium: 9.4 mg/dL (ref 8.6–10.4)
Chloride: 103 mmol/L (ref 98–110)
Creat: 0.77 mg/dL (ref 0.60–0.88)
Glucose, Bld: 88 mg/dL (ref 65–99)
Potassium: 4.8 mmol/L (ref 3.5–5.3)
Sodium: 141 mmol/L (ref 135–146)
Total Bilirubin: 0.4 mg/dL (ref 0.2–1.2)
Total Protein: 7.1 g/dL (ref 6.1–8.1)

## 2016-10-31 LAB — LIPID PANEL
Cholesterol: 227 mg/dL — ABNORMAL HIGH (ref <200)
HDL: 47 mg/dL — ABNORMAL LOW (ref >50)
LDL Cholesterol: 147 mg/dL — ABNORMAL HIGH (ref <100)
Total CHOL/HDL Ratio: 4.8 Ratio (ref <5.0)
Triglycerides: 164 mg/dL — ABNORMAL HIGH (ref <150)
VLDL: 33 mg/dL — ABNORMAL HIGH (ref <30)

## 2016-10-31 MED ORDER — ISOSORBIDE MONONITRATE ER 30 MG PO TB24: 30.0000 mg | ORAL_TABLET | Freq: Every day | ORAL | 6 refills | Status: DC

## 2016-10-31 MED ORDER — METOPROLOL TARTRATE 25 MG PO TABS: 12.5000 mg | ORAL_TABLET | Freq: Two times a day (BID) | ORAL | 3 refills | Status: DC

## 2016-10-31 MED ORDER — ASPIRIN EC 81 MG PO TBEC: 81.0000 mg | DELAYED_RELEASE_TABLET | Freq: Every day | ORAL | 3 refills | Status: DC

## 2016-10-31 NOTE — Patient Instructions (Addendum)
Your physician has requested that you have a cardiac catheterization. Cardiac catheterization is used to diagnose and/or treat various heart conditions. Doctors may recommend this procedure for a number of different reasons. The most common reason is to evaluate chest pain. Chest pain can be a symptom of coronary artery disease (CAD), and cardiac catheterization can show whether plaque is narrowing or blocking your heart's arteries. This procedure is also used to evaluate the valves, as well as measure the blood flow and oxygen levels in different parts of your heart. For further information please visit [email or web address].  This will be done Monday December 11th by Dr [name].  Following your catheterization, you will not be allowed to drive for 3 days.  No lifting, pushing, or pulling greater that 10 pounds is allowed for 1 week.  You will be required to have the following tests prior to the procedure:  1. Blood work-the blood work can be done no more than 7 days prior to the procedure.  It can be done at any [facility] lab.  There is one downstairs on the first floor of this building and one in the [facility] building [phone])  Your physician has recommended you make the following change in your medication:   1.) start new prescription for isosorbide 30 mg and lopressor 12. 5 mg daily. These prescriptions has been sent to your pharmacy.  Dr [name] also recommends that you begin Asiprin 81 mg daily.

## 2016-11-01 ENCOUNTER — Other Ambulatory Visit: Payer: Self-pay | Admitting: *Deleted

## 2016-11-01 ENCOUNTER — Ambulatory Visit (HOSPITAL_COMMUNITY): Admission: RE | Admit: 2016-11-01 | Payer: Medicare Other | Source: Ambulatory Visit

## 2016-11-01 DIAGNOSIS — Z01818 Encounter for other preprocedural examination: Secondary | ICD-10-CM

## 2016-11-01 LAB — APTT: aPTT: 27 s (ref 22–34)

## 2016-11-01 LAB — PROTIME-INR
INR: 1
Prothrombin Time: 10.4 s (ref 9.0–11.5)

## 2016-11-04 ENCOUNTER — Inpatient Hospital Stay (HOSPITAL_COMMUNITY)
Admission: RE | Admit: 2016-11-04 | Discharge: 2016-11-06 | DRG: 247 | Disposition: A | Discharge status: Home / Self Care | Payer: Medicare Other | Source: Ambulatory Visit | Attending: Cardiovascular Disease | Admitting: Cardiovascular Disease

## 2016-11-04 ENCOUNTER — Encounter (HOSPITAL_COMMUNITY)
Admission: RE | Disposition: A | Discharge status: Home / Self Care | Payer: Self-pay | Source: Ambulatory Visit | Attending: Cardiovascular Disease

## 2016-11-04 ENCOUNTER — Encounter (HOSPITAL_COMMUNITY): Payer: Self-pay | Admitting: General Practice

## 2016-11-04 DIAGNOSIS — H9190 Unspecified hearing loss, unspecified ear: Secondary | ICD-10-CM | POA: Diagnosis present

## 2016-11-04 DIAGNOSIS — E782 Mixed hyperlipidemia: Secondary | ICD-10-CM | POA: Diagnosis not present

## 2016-11-04 DIAGNOSIS — Z87891 Personal history of nicotine dependence: Secondary | ICD-10-CM | POA: Diagnosis not present

## 2016-11-04 DIAGNOSIS — Z90710 Acquired absence of both cervix and uterus: Secondary | ICD-10-CM

## 2016-11-04 DIAGNOSIS — I200 Unstable angina: Secondary | ICD-10-CM | POA: Diagnosis present

## 2016-11-04 DIAGNOSIS — Z79899 Other long term (current) drug therapy: Secondary | ICD-10-CM | POA: Diagnosis not present

## 2016-11-04 DIAGNOSIS — F329 Major depressive disorder, single episode, unspecified: Secondary | ICD-10-CM | POA: Diagnosis present

## 2016-11-04 DIAGNOSIS — Z853 Personal history of malignant neoplasm of breast: Secondary | ICD-10-CM

## 2016-11-04 DIAGNOSIS — I25110 Atherosclerotic heart disease of native coronary artery with unstable angina pectoris: Secondary | ICD-10-CM | POA: Diagnosis not present

## 2016-11-04 DIAGNOSIS — I10 Essential (primary) hypertension: Secondary | ICD-10-CM

## 2016-11-04 DIAGNOSIS — I471 Supraventricular tachycardia: Secondary | ICD-10-CM | POA: Diagnosis not present

## 2016-11-04 DIAGNOSIS — E785 Hyperlipidemia, unspecified: Secondary | ICD-10-CM | POA: Diagnosis present

## 2016-11-04 DIAGNOSIS — Z923 Personal history of irradiation: Secondary | ICD-10-CM

## 2016-11-04 DIAGNOSIS — Z91041 Radiographic dye allergy status: Secondary | ICD-10-CM | POA: Diagnosis not present

## 2016-11-04 DIAGNOSIS — Z882 Allergy status to sulfonamides status: Secondary | ICD-10-CM | POA: Diagnosis not present

## 2016-11-04 DIAGNOSIS — K219 Gastro-esophageal reflux disease without esophagitis: Secondary | ICD-10-CM | POA: Diagnosis present

## 2016-11-04 DIAGNOSIS — Z01818 Encounter for other preprocedural examination: Secondary | ICD-10-CM

## 2016-11-04 DIAGNOSIS — I2510 Atherosclerotic heart disease of native coronary artery without angina pectoris: Secondary | ICD-10-CM

## 2016-11-04 DIAGNOSIS — H5462 Unqualified visual loss, left eye, normal vision right eye: Secondary | ICD-10-CM | POA: Diagnosis present

## 2016-11-04 DIAGNOSIS — Z955 Presence of coronary angioplasty implant and graft: Secondary | ICD-10-CM

## 2016-11-04 DIAGNOSIS — R0602 Shortness of breath: Secondary | ICD-10-CM | POA: Diagnosis not present

## 2016-11-04 DIAGNOSIS — Z791 Long term (current) use of non-steroidal anti-inflammatories (NSAID): Secondary | ICD-10-CM | POA: Diagnosis not present

## 2016-11-04 DIAGNOSIS — Z809 Family history of malignant neoplasm, unspecified: Secondary | ICD-10-CM

## 2016-11-04 DIAGNOSIS — F419 Anxiety disorder, unspecified: Secondary | ICD-10-CM | POA: Diagnosis not present

## 2016-11-04 HISTORY — PX: CORONARY ANGIOPLASTY: SHX604

## 2016-11-04 HISTORY — PX: CARDIAC CATHETERIZATION: SHX172

## 2016-11-04 HISTORY — DX: Atherosclerotic heart disease of native coronary artery without angina pectoris: I25.10

## 2016-11-04 LAB — POCT ACTIVATED CLOTTING TIME: Activated Clotting Time: 472 seconds

## 2016-11-04 SURGERY — LEFT HEART CATH AND CORONARY ANGIOGRAPHY
Anesthesia: LOCAL

## 2016-11-04 MED ORDER — FENTANYL CITRATE (PF) 100 MCG/2ML IJ SOLN
INTRAMUSCULAR | Status: DC | PRN
  Administered 2016-11-04 (×3): 25 ug via INTRAVENOUS

## 2016-11-04 MED ORDER — DIPHENHYDRAMINE HCL 50 MG/ML IJ SOLN
25.0000 mg | INTRAMUSCULAR | Status: AC
  Administered 2016-11-04: 25 mg via INTRAVENOUS

## 2016-11-04 MED ORDER — VERAPAMIL HCL 2.5 MG/ML IV SOLN
INTRAVENOUS | Status: DC | PRN
  Administered 2016-11-04 (×2): 10 mL via INTRA_ARTERIAL

## 2016-11-04 MED ORDER — TICAGRELOR 90 MG PO TABS
ORAL_TABLET | ORAL | Status: DC | PRN
  Administered 2016-11-04: 180 mg via ORAL

## 2016-11-04 MED ORDER — SODIUM CHLORIDE 0.9 % IV SOLN
INTRAVENOUS | Status: DC | PRN
  Administered 2016-11-04: 1.75 mg/kg/h via INTRAVENOUS

## 2016-11-04 MED ORDER — SODIUM CHLORIDE 0.9 % IV SOLN
INTRAVENOUS | Status: DC
  Administered 2016-11-04: 06:00:00 via INTRAVENOUS

## 2016-11-04 MED ORDER — NITROGLYCERIN 1 MG/10 ML FOR IR/CATH LAB
INTRA_ARTERIAL | Status: DC | PRN
  Administered 2016-11-04 (×3): 200 ug via INTRACORONARY

## 2016-11-04 MED ORDER — LIDOCAINE HCL (PF) 1 % IJ SOLN
INTRAMUSCULAR | Status: AC
  Filled 2016-11-04: qty 30

## 2016-11-04 MED ORDER — ACETAMINOPHEN 325 MG PO TABS
650.0000 mg | ORAL_TABLET | ORAL | Status: DC | PRN
  Administered 2016-11-04 – 2016-11-05 (×2): 650 mg via ORAL
  Filled 2016-11-04 (×3): qty 2

## 2016-11-04 MED ORDER — HEPARIN SODIUM (PORCINE) 1000 UNIT/ML IJ SOLN
INTRAMUSCULAR | Status: DC | PRN
  Administered 2016-11-04: 4000 [IU] via INTRAVENOUS

## 2016-11-04 MED ORDER — DIAZEPAM 5 MG PO TABS: 5.0000 mg | ORAL_TABLET | ORAL | Status: DC

## 2016-11-04 MED ORDER — MIDAZOLAM HCL 2 MG/2ML IJ SOLN
INTRAMUSCULAR | Status: AC
  Filled 2016-11-04: qty 2

## 2016-11-04 MED ORDER — SODIUM CHLORIDE 0.9% FLUSH: 3.0000 mL | INTRAVENOUS | Status: DC | PRN

## 2016-11-04 MED ORDER — TICAGRELOR 90 MG PO TABS
ORAL_TABLET | ORAL | Status: AC
  Filled 2016-11-04: qty 2

## 2016-11-04 MED ORDER — SODIUM CHLORIDE 0.9 % IV SOLN: 250.0000 mL | INTRAVENOUS | Status: DC | PRN

## 2016-11-04 MED ORDER — BIVALIRUDIN 250 MG IV SOLR
INTRAVENOUS | Status: AC
  Filled 2016-11-04: qty 250

## 2016-11-04 MED ORDER — METHYLPREDNISOLONE SODIUM SUCC 125 MG IJ SOLR
INTRAMUSCULAR | Status: AC
  Administered 2016-11-04: 125 mg via INTRAVENOUS
  Filled 2016-11-04: qty 2

## 2016-11-04 MED ORDER — LIDOCAINE HCL (PF) 1 % IJ SOLN
INTRAMUSCULAR | Status: DC | PRN
  Administered 2016-11-04: 4 mL via SUBCUTANEOUS

## 2016-11-04 MED ORDER — ISOSORBIDE MONONITRATE ER 30 MG PO TB24
30.0000 mg | ORAL_TABLET | Freq: Every day | ORAL | Status: DC
  Administered 2016-11-05 – 2016-11-06 (×2): 30 mg via ORAL
  Filled 2016-11-04 (×3): qty 1

## 2016-11-04 MED ORDER — FAMOTIDINE 20 MG PO TABS
ORAL_TABLET | ORAL | Status: AC
  Filled 2016-11-04: qty 1

## 2016-11-04 MED ORDER — SODIUM CHLORIDE 0.9% FLUSH: 3.0000 mL | Freq: Two times a day (BID) | INTRAVENOUS | Status: DC

## 2016-11-04 MED ORDER — FENTANYL CITRATE (PF) 100 MCG/2ML IJ SOLN
INTRAMUSCULAR | Status: AC
Start: 2016-11-04 — End: 2016-11-04
  Filled 2016-11-04: qty 2

## 2016-11-04 MED ORDER — HEPARIN (PORCINE) IN NACL 2-0.9 UNIT/ML-% IJ SOLN
INTRAMUSCULAR | Status: DC | PRN
  Administered 2016-11-04: 1000 mL

## 2016-11-04 MED ORDER — ATORVASTATIN CALCIUM 80 MG PO TABS
80.0000 mg | ORAL_TABLET | Freq: Every day | ORAL | Status: DC
  Administered 2016-11-04 – 2016-11-05 (×2): 80 mg via ORAL
  Filled 2016-11-04 (×4): qty 1

## 2016-11-04 MED ORDER — MIDAZOLAM HCL 2 MG/2ML IJ SOLN
INTRAMUSCULAR | Status: DC | PRN
  Administered 2016-11-04 (×2): 1 mg via INTRAVENOUS

## 2016-11-04 MED ORDER — DIAZEPAM 5 MG PO TABS
5.0000 mg | ORAL_TABLET | Freq: Four times a day (QID) | ORAL | Status: DC | PRN
  Administered 2016-11-04 (×2): 5 mg via ORAL
  Filled 2016-11-04: qty 1

## 2016-11-04 MED ORDER — IOPAMIDOL (ISOVUE-370) INJECTION 76%
INTRAVENOUS | Status: AC
  Filled 2016-11-04: qty 100

## 2016-11-04 MED ORDER — LABETALOL HCL 5 MG/ML IV SOLN: 10.0000 mg | INTRAVENOUS | Status: AC | PRN

## 2016-11-04 MED ORDER — IOPAMIDOL (ISOVUE-370) INJECTION 76%
INTRAVENOUS | Status: DC | PRN
  Administered 2016-11-04: 160 mL via INTRA_ARTERIAL

## 2016-11-04 MED ORDER — VERAPAMIL HCL 2.5 MG/ML IV SOLN
INTRAVENOUS | Status: AC
  Filled 2016-11-04: qty 2

## 2016-11-04 MED ORDER — DIAZEPAM 5 MG PO TABS
5.0000 mg | ORAL_TABLET | ORAL | Status: DC | PRN
  Administered 2016-11-04 – 2016-11-05 (×3): 5 mg via ORAL
  Filled 2016-11-04 (×4): qty 1

## 2016-11-04 MED ORDER — SODIUM CHLORIDE 0.9 % IV SOLN
INTRAVENOUS | Status: DC
  Administered 2016-11-04 (×2): via INTRAVENOUS

## 2016-11-04 MED ORDER — BIVALIRUDIN BOLUS VIA INFUSION - CUPID
INTRAVENOUS | Status: DC | PRN
  Administered 2016-11-04: 56.475 mg via INTRAVENOUS

## 2016-11-04 MED ORDER — NITROGLYCERIN 0.4 MG SL SUBL
SUBLINGUAL_TABLET | SUBLINGUAL | Status: AC
  Filled 2016-11-04: qty 1

## 2016-11-04 MED ORDER — ONDANSETRON HCL 4 MG/2ML IJ SOLN: 4.0000 mg | Freq: Four times a day (QID) | INTRAMUSCULAR | Status: DC | PRN

## 2016-11-04 MED ORDER — METHYLPREDNISOLONE SODIUM SUCC 125 MG IJ SOLR
125.0000 mg | INTRAMUSCULAR | Status: AC
  Administered 2016-11-04: 125 mg via INTRAVENOUS

## 2016-11-04 MED ORDER — METOPROLOL TARTRATE 12.5 MG HALF TABLET
12.5000 mg | ORAL_TABLET | Freq: Two times a day (BID) | ORAL | Status: DC
  Administered 2016-11-04 – 2016-11-05 (×2): 12.5 mg via ORAL
  Filled 2016-11-04 (×3): qty 1

## 2016-11-04 MED ORDER — HYDRALAZINE HCL 20 MG/ML IJ SOLN: 5.0000 mg | INTRAMUSCULAR | Status: AC | PRN

## 2016-11-04 MED ORDER — DIPHENHYDRAMINE HCL 50 MG/ML IJ SOLN
INTRAMUSCULAR | Status: AC
  Administered 2016-11-04: 25 mg via INTRAVENOUS
  Filled 2016-11-04: qty 1

## 2016-11-04 MED ORDER — ASPIRIN 81 MG PO CHEW
81.0000 mg | CHEWABLE_TABLET | Freq: Every day | ORAL | Status: DC
  Administered 2016-11-06: 10:00:00 81 mg via ORAL
  Filled 2016-11-04: qty 1

## 2016-11-04 MED ORDER — HEPARIN SODIUM (PORCINE) 1000 UNIT/ML IJ SOLN
INTRAMUSCULAR | Status: AC
  Filled 2016-11-04: qty 1

## 2016-11-04 MED ORDER — FAMOTIDINE 20 MG PO TABS
20.0000 mg | ORAL_TABLET | ORAL | Status: AC
  Administered 2016-11-04: 20 mg via ORAL

## 2016-11-04 MED ORDER — HEPARIN (PORCINE) IN NACL 2-0.9 UNIT/ML-% IJ SOLN
INTRAMUSCULAR | Status: AC
  Filled 2016-11-04: qty 1000

## 2016-11-04 MED ORDER — ANGIOPLASTY BOOK
Freq: Once | Status: AC
  Administered 2016-11-04: 11:00:00
  Filled 2016-11-04: qty 1

## 2016-11-04 MED ORDER — TICAGRELOR 90 MG PO TABS
90.0000 mg | ORAL_TABLET | Freq: Two times a day (BID) | ORAL | Status: DC
  Administered 2016-11-04 – 2016-11-06 (×4): 90 mg via ORAL
  Filled 2016-11-04 (×4): qty 1

## 2016-11-04 SURGICAL SUPPLY — 21 items
BALLN MOZEC 2.0X9 (BALLOONS) ×2
BALLN ~~LOC~~ MOZEC 2.5X8 (BALLOONS) ×4
BALLOON MOZEC 2.0X9 (BALLOONS) ×1 IMPLANT
BALLOON ~~LOC~~ MOZEC 2.5X8 (BALLOONS) ×2 IMPLANT
CATH INFINITI 5 FR STR PIGTAIL (CATHETERS) ×2 IMPLANT
CATH OPTITORQUE TIG 4.0 5F (CATHETERS) ×2 IMPLANT
CATH VISTA GUIDE 6FR JR4 (CATHETERS) ×2 IMPLANT
CATH VISTA GUIDE 6FR JR4 SH (CATHETERS) ×2 IMPLANT
DEVICE RAD COMP TR BAND LRG (VASCULAR PRODUCTS) ×2 IMPLANT
GLIDESHEATH SLEND SS 6F .021 (SHEATH) ×2 IMPLANT
GUIDEWIRE INQWIRE 1.5J.035X260 (WIRE) ×1 IMPLANT
INQWIRE 1.5J .035X260CM (WIRE) ×2
KIT ENCORE 26 ADVANTAGE (KITS) ×2 IMPLANT
KIT HEART LEFT (KITS) ×2 IMPLANT
PACK CARDIAC CATHETERIZATION (CUSTOM PROCEDURE TRAY) ×2 IMPLANT
STENT RESOLUTE ONYX 2.25X12 (Permanent Stent) ×2 IMPLANT
SYR MEDRAD MARK V 150ML (SYRINGE) ×2 IMPLANT
TRANSDUCER W/STOPCOCK (MISCELLANEOUS) ×2 IMPLANT
TUBING CIL FLEX 10 FLL-RA (TUBING) ×2 IMPLANT
WIRE COUGAR XT STRL 190CM (WIRE) ×2 IMPLANT
WIRE HI TORQ VERSACORE-J 145CM (WIRE) ×2 IMPLANT

## 2016-11-04 NOTE — Interval H&P Note (Signed)
Cath Lab Visit (complete for each Cath Lab visit)  Clinical Evaluation Leading to the Procedure:   ACS: No.  Non-ACS:    Anginal Classification: CCS III  Anti-ischemic medical therapy: Maximal Therapy (2 or more classes of medications)  Non-Invasive Test Results: No non-invasive testing performed  Prior CABG: No previous CABG      History and Physical Interval Note:  11/04/2016 7:53 AM  Kara Woods  has presented today for surgery, with the diagnosis of cp  The various methods of treatment have been discussed with the patient and family. After consideration of risks, benefits and other options for treatment, the patient has consented to  Procedure(s): Left Heart Cath and Coronary Angiography (N/A) as a surgical intervention .  The patient's history has been reviewed, patient examined, no change in status, stable for surgery.  I have reviewed the patient's chart and labs.  Questions were answered to the patient's satisfaction.     Shelva Majestic

## 2016-11-04 NOTE — Progress Notes (Signed)
[facility] [place] # [id number] # [phone]   BRILINTA 90 MG BID  30/60 TAB   COVER- YES  CO-PAY- $ 45.00  PRIOR APPROVAL- NO  PHARMACY : [facility]

## 2016-11-04 NOTE — Progress Notes (Signed)
TR BAND REMOVAL: Right Radial DEFLATED PER PROTOCOL: yes TIME BAND OFF/DRESSING APPLIED:1540 SITE UPON ARRIVAL: level 0 SITE AFTER BAND REMOVAL: level 0 CIRCULATION SENSATION MOVEMENT: within normal limits yes COMMENTS: post TR Band restrictions given. No hematoma, no bleeding. Pt tolerated well.

## 2016-11-04 NOTE — H&P (View-Only) (Signed)
Cardiology Office Note    Date:  11/04/2016   ID:  [name], DOB [birth year], MRN [id number]  PCP:  Asencion Noble, MD  Cardiologist:  Shelva Majestic, MD   Chief Complaint  Patient presents with  . New Patient (Initial Visit)    History of Present Illness:  Kara Woods is a 82 y.o. female who is referred through the courtesy of Dr. Velvet Bathe for evaluation of exertional shortness of breath and chest discomfort.  Kara Woods is a mother of my patient, who had suffered an out of hospital cardiac arrest years ago.  Kara Woods has a history of prior cardiac catheterization and from records [name] had undergone cardiac catheterization in 1998.  The patient states her coronaries were clean.  I do not have specifics of her catheterization findings.  Over the past 6 months, she has developed progressive decline in ability to be active and has noticed significant increasing exertionally precipitated shortness of breath.  She also has had some episodes of associated chest pressure with left arm radiation.  She had undergone an echo Doppler study on 10/24/2016 which showed an EF of 50% and suggested diffuse hypokinesis.  There was grade 1 diastolic dysfunction.  There was mild MR and mild LA dilatation.  She also had undergone pulmonary function testing was not significantly abnormal.  Due to her progressive symptomatology.  She is referred for cardiac catheterization.  Past history is notable for left breast CA and she is status post prior lumpectomy and radiation treatment.  She was cared for by Dr. Excell Seltzer.  She also is status post hysterectomy.  Remote history of cholecystectomy.  She has a history of retinal detachment.    Past Medical History:  Diagnosis Date  . Anxiety   . Arthritis   . Blind left eye   . Breast disorder    cancer left  . Cancer ([place])    left breast   . Colitis   . Cystocele 10/11/2013  . Depression   . GERD (gastroesophageal reflux disease)    occasional  Tums only  . HOH (hard of hearing)   . Pelvic relaxation 10/11/2013  . Proctitis    colonsocopy 2007, Canasa suppositories  . S/P endoscopy March 2009   mild erosive reflux esophagitis, Schatzki's ring, s/p dilation  . Schatzki's ring   . Shortness of breath    occ if anxiety  . Wears dentures    upper  . Wears glasses     Past Surgical History:  Procedure Laterality Date  . ANTERIOR AND POSTERIOR REPAIR N/A 05/17/2014   Procedure: ANTERIOR (CYSTOCELE) AND POSTERIOR REPAIR (RECTOCELE);  Surgeon: Reece Packer, MD;  Location: [facility];  Service: Urology;  Laterality: N/A;  . BREAST LUMPECTOMY WITH NEEDLE LOCALIZATION AND AXILLARY SENTINEL LYMPH NODE BX Left 05/03/2013   Procedure: BREAST LUMPECTOMY WITH NEEDLE LOCALIZATION AND AXILLARY SENTINEL LYMPH NODE BX;  Surgeon: Edward Jolly, MD;  Location: [facility];  Service: General;  Laterality: Left;  . BREAST SURGERY Left 05/19/13  . CARDIAC CATHETERIZATION  1998  . COLONOSCOPY  05/06/2006   Diffuse inflammatory changes of the rectal mucosa, consistent  with proctitis.  Otherwise, normal colon to terminal ileum  . CYSTOSCOPY N/A 05/17/2014   Procedure: CYSTOSCOPY;  Surgeon: Reece Packer, MD;  Location: [facility];  Service: Urology;  Laterality: N/A;  . ESOPHAGOGASTRODUODENOSCOPY  02/09/2008   Schatzki ring status post dilation/Distal esophageal erosion consistent with mild erosive reflux  esophagitis, otherwise unremarkable esophagus,  normal stomach, D1, D2.  . EYE SURGERY     lt cataract-implant  . EYE SURGERY     lt-lens repaced,l  . [place] SURGERY  1993  . RE-EXCISION OF BREAST LUMPECTOMY Left 05/19/2013   Procedure: RE-EXCISION OF BREAST LUMPECTOMY;  Surgeon: Edward Jolly, MD;  Location: [facility];  Service: General;  Laterality: Left;  . RETINAL DETACHMENT SURGERY  1978   Left  . VAGINAL HYSTERECTOMY Bilateral 05/17/2014   Procedure: HYSTERECTOMY VAGINAL with Bilateral  Salpingo-Oophorectomy; Bladder cystotomy repair;  Surgeon: Marvene Staff, MD;  Location: [facility];  Service: Gynecology;  Laterality: Bilateral;  . VAGINAL PROLAPSE REPAIR N/A 05/17/2014   Procedure: VAGINAL VAULT PROLAPSE AND GRAFT;  Surgeon: Reece Packer, MD;  Location: [facility];  Service: Urology;  Laterality: N/A;    Current Medications: No facility-administered medications prior to visit.    Outpatient Medications Prior to Visit  Medication Sig Dispense Refill  . acetaminophen (TYLENOL) 500 MG tablet Take 500-1,000 mg by mouth every 6 (six) hours as needed for mild pain.    . Travoprost, BAK Free, (TRAVATAN) 0.004 % SOLN ophthalmic solution Place 1 drop into the right eye at bedtime.     . clorazepate (TRANXENE) 7.5 MG tablet Take 7.5 mg by mouth 2 (two) times daily as needed for anxiety.    . naproxen (NAPROSYN) 375 MG tablet Take 1 tablet (375 mg total) by mouth 2 (two) times daily. 14 tablet 0     Allergies:   Contrast media [iodinated diagnostic agents] and Sulfonamide derivatives   Social History   Social History  . Marital status: Widowed    Spouse name: N/A  . Number of children: N/A  . Years of education: N/A   Social History Main Topics  . Smoking status: Former Smoker    Packs/day: 1.00    Types: Cigarettes    Quit date: 04/28/1990  . Smokeless tobacco: Never Used     Comment: quit 19 yrs ago  . Alcohol use Yes     Comment: a glass of wine once or twice a week  . Drug use: No  . Sexual activity: Not Currently    Birth control/ protection: Post-menopausal   Other Topics Concern  . None   Social History Narrative  . None     Family History:  The patient's family history includes Cancer in her brother.  I mother died and had a myocardial infarction.  Her father also had suffered a myocardial infarction.  She had 3 sisters who also had myocardial infarction and also one of her brothers also had suffered a myocardial infarction.  ROS General:  Negative; No fevers, chills, or night sweats;  HEENT: History of retinal detachment, sinus congestion, difficulty swallowing Pulmonary: Negative; No cough, wheezing, shortness of breath, hemoptysis Cardiovascular: Negative; No chest pain, presyncope, syncope, palpitations GI: Negative; No nausea, vomiting, diarrhea, or abdominal pain GU: Negative; No dysuria, hematuria, or difficulty voiding Musculoskeletal: Negative; no myalgias, joint pain, or weakness Hematologic/Oncology: Negative; no easy bruising, bleeding Endocrine: Negative; no heat/cold intolerance; no diabetes Neuro: Negative; no changes in balance, headaches Skin: Negative; No rashes or skin lesions Psychiatric: Negative; No behavioral problems, depression Sleep: Negative; No snoring, daytime sleepiness, hypersomnolence, bruxism, restless legs, hypnogognic hallucinations, no cataplexy Other comprehensive 14 point system review is negative.   PHYSICAL EXAM:   VS:  BP 129/81   Pulse 74   Ht 5' 6.5" (1.689 m)   Wt 169 lb 3.2 oz (76.7 kg)  BMI 26.90 kg/m    Wt Readings from Last 3 Encounters:  11/04/16 166 lb (75.3 kg)  10/31/16 169 lb 3.2 oz (76.7 kg)  09/01/16 160 lb (72.6 kg)    General: Alert, oriented, no distress.  Skin: normal turgor, no rashes, warm and dry HEENT: Normocephalic, atraumatic. Pupils equal round and reactive to light; sclera anicteric; extraocular muscles intact; Fundi Disks flat.  No hemorrhages or exudates. Nose without nasal septal hypertrophy Mouth/Parynx benign; Mallinpatti scale 3 Neck: No JVD, no carotid bruits; normal carotid upstroke Lungs: clear to ausculatation and percussion; no wheezing or rales Chest wall: without tenderness to palpitation Heart: PMI not displaced, RRR, s1 s2 normal, 1/6 systolic murmur, no diastolic murmur, no rubs, gallops, thrills, or heaves Abdomen: soft, nontender; no hepatosplenomehaly, BS+; abdominal aorta nontender and not dilated by palpation. Back: no CVA  tenderness Pulses 2+ Musculoskeletal: full range of motion, normal strength, no joint deformities Extremities: no clubbing cyanosis or edema, Homan's sign negative  Neurologic: grossly nonfocal; Cranial nerves grossly wnl Psychologic: Normal mood and affect   Studies/Labs Reviewed:   ECG (independently read by me): Normal sinus rhythm at 74 bpm.  No ectopy.  QTc interval 455 ms.  Recent Labs: BMP Latest Ref Rng & Units 10/31/2016 09/01/2016 05/28/2014  Glucose 65 - 99 mg/dL 88 88 100(H)  BUN 7 - 25 mg/dL _0 Creatinine 0.60 - 0.88 mg/dL 0.77 0.75 0.80  Sodium 135 - 146 mmol/L 141 138 142  Potassium 3.5 - 5.3 mmol/L 4.8 4.6 4.0  Chloride 98 - 110 mmol/L 103 107 105  CO2 20 - 31 mmol/L 29 28 -  Calcium 8.6 - 10.4 mg/dL 9.4 8.6(L) -     Hepatic Function Latest Ref Rng & Units 10/31/2016 05/28/2014  Total Protein 6.1 - 8.1 g/dL 7.1 7.7  Albumin 3.6 - 5.1 g/dL 4.3 3.4(L)  AST 10 - 35 U/L 17 15  ALT 6 - 29 U/L 10 9  Alk Phosphatase 33 - 130 U/L 55 75  Total Bilirubin 0.2 - 1.2 mg/dL 0.4 0.3    CBC Latest Ref Rng & Units 10/31/2016 09/01/2016 05/28/2014  WBC 3.8 - 10.8 K/uL 8.8 7.1 -  Hemoglobin 11.7 - 15.5 g/dL 13.8 12.9 13.6  Hematocrit 35.0 - 45.0 % 42.8 39.1 40.0  Platelets 140 - 400 K/uL 270 219 -   Lab Results  Component Value Date   MCV 93.4 10/31/2016   MCV 93.5 09/01/2016   MCV 91.1 05/28/2014   No results found for: TSH No results found for: HGBA1C   BNP    Component Value Date/Time   BNP 51.0 09/01/2016 1012    ProBNP No results found for: PROBNP   Lipid Panel     Component Value Date/Time   CHOL 227 (H) 10/31/2016 1245   TRIG 164 (H) 10/31/2016 1245   HDL 47 (L) 10/31/2016 1245   CHOLHDL 4.8 10/31/2016 1245   VLDL 33 (H) 10/31/2016 1245   LDLCALC 147 (H) 10/31/2016 1245     RADIOLOGY: No results found.   Additional studies/ records that were reviewed today include:  I reviewed the office records of [name].  I reviewed her echo Doppler  study and pulmonary function tests.    ASSESSMENT:    1. Exertional dyspnea   2. Chest pain, unspecified type   3. Chest tightness   4. Screening, lipid   5. History of breast cancer   6. Long term current use of anticoagulant   7. Pre-op testing  8. History of detached retina repair      PLAN:  Kara Woods is an 82 year old Caucasian female who has developed progressive decline in exercise tolerance with significant increasing shortness of breath with minimal activity associated with chest pressure and left arm radiation.  In 1998, reportedly a cardiac catheterization had shown normal coronary arteries.  She has been found to have read ejection fraction of 50% with diffuse hypocontractility on echo Doppler assessment.  Her pulmonary function studies.  Has not identified significant pulmonary disability in the etiology of her dyspnea.  Her very strong family history of myocardial infarctions in multiple family members, as well as remote tobacco history and progressive changes in symptomatology.  It is my recommendation that definitive evaluation with cardiac catheterization be performed.  At this point, I do not believe noninvasive strategy will provide the absolute certainty that the patient needs if the study was abnormal.  I have started her on isosorbide mononitrate 30 mg daily as well as metoprolol, tartrate 12.5 mg twice a day.  I discussed the cardiac catheterization procedure in detail with the patient.I have reviewed the risks, indications, and alternatives to cardiac catheterization, possible angioplasty, and stenting with the patient. Risks include but are not limited to bleeding, infection, vascular injury, stroke, myocardial infection, arrhythmia, kidney injury, radiation-related injury in the case of prolonged fluoroscopy use, emergency cardiac surgery, and death. The patient understands the risks of serious complication is 1-2 in 9244 with diagnostic cardiac cath and 1-2% or  less with angioplasty/stenting.  She has agreed to undergo this procedure.    Medication Adjustments/Labs and Tests Ordered: Current medicines are reviewed at length with the patient today.  Concerns regarding medicines are outlined above.  Medication changes, Labs and Tests ordered today are listed in the Patient Instructions below.\  Patient Instructions  Your physician has requested that you have a cardiac catheterization. Cardiac catheterization is used to diagnose and/or treat various heart conditions. Doctors may recommend this procedure for a number of different reasons. The most common reason is to evaluate chest pain. Chest pain can be a symptom of coronary artery disease (CAD), and cardiac catheterization can show whether plaque is narrowing or blocking your heart's arteries. This procedure is also used to evaluate the valves, as well as measure the blood flow and oxygen levels in different parts of your heart. For further information please visit [email or web address].  This will be done Monday December 11th by Dr Claiborne Woods.  Following your catheterization, you will not be allowed to drive for 3 days.  No lifting, pushing, or pulling greater that 10 pounds is allowed for 1 week.  You will be required to have the following tests prior to the procedure:  1. Blood work-the blood work can be done no more than 7 days prior to the procedure.  It can be done at any [facility] lab.  There is one downstairs on the first floor of this building and one in the [facility] building [phone])  Your physician has recommended you make the following change in your medication:   1.) start new prescription for isosorbide 30 mg and lopressor 12. 5 mg daily. These prescriptions has been sent to your pharmacy.  Dr Claiborne Woods also recommends that you begin Asiprin 81 mg daily.     Signed, Shelva Majestic, MD  11/04/2016 7:52 AM    [facility] [address] [place], [place], [place]  [address] Phone: [phone]

## 2016-11-04 NOTE — Progress Notes (Signed)
Pt with c/o back pain and increased nervousness. Pt restless and beginning to get agitated about the lines and cords she is attached to. Pt was given valium 5mg  for her anxiety at 1131 today with good results.  Pt takes tranzene 7.5mg  bid prn at home.  Discussed with Reino Bellis PA and valium order changed to every 4 hrs prn agitation instead of every 6 hrs. 1640-pt resting comfortably after 5mg  valium at 1554.

## 2016-11-04 NOTE — Progress Notes (Signed)
Pt c/o of little chest pressure 7 out of 10as well as shortness of breath, BP 148/73, O2 sat 98% of room air, gave patient 1 SL NTG, pressure little better 3 out of 10,  Shortness of breath could be related to [place], gave patient a coke to see if that would help.  1030 PB 130/59, O2 sats 100% of 2L , will continue to monitor

## 2016-11-04 NOTE — Progress Notes (Addendum)
Cardiology Office Note    Date:  11/04/2016   ID:  Kara Woods, DOB [birth year], MRN [id number]  PCP:  Asencion Noble, MD  Cardiologist:  Shelva Majestic, MD   Chief Complaint  Patient presents with  . New Patient (Initial Visit)    History of Present Illness:  Kara Woods is a 82 y.o. female who is referred through the courtesy of Dr. Velvet Bathe for evaluation of exertional shortness of breath and chest discomfort.  Kara Woods is a mother of my patient, who had suffered an out of hospital cardiac arrest years ago.  Kara Woods has a history of prior cardiac catheterization and from records [name] had undergone cardiac catheterization in 1998.  The patient states her coronaries were clean.  I do not have specifics of her catheterization findings.  Over the past 6 months, she has developed progressive decline in ability to be active and has noticed significant increasing exertionally precipitated shortness of breath.  She also has had some episodes of associated chest pressure with left arm radiation.  She had undergone an echo Doppler study on 10/24/2016 which showed an EF of 50% and suggested diffuse hypokinesis.  There was grade 1 diastolic dysfunction.  There was mild MR and mild Kara dilatation.  She also had undergone pulmonary function testing was not significantly abnormal.  Due to her progressive symptomatology.  She is referred for cardiac catheterization.  Past history is notable for left breast CA and she is status post prior lumpectomy and radiation treatment.  She was cared for by Dr. Excell Seltzer.  She also is status post hysterectomy.  Remote history of cholecystectomy.  She has a history of retinal detachment.    Past Medical History:  Diagnosis Date  . Anxiety   . Arthritis   . Blind left eye   . Breast disorder    cancer left  . Cancer ([place])    left breast   . Colitis   . Cystocele 10/11/2013  . Depression   . GERD (gastroesophageal reflux disease)    occasional  Tums only  . HOH (hard of hearing)   . Pelvic relaxation 10/11/2013  . Proctitis    colonsocopy 2007, Canasa suppositories  . S/P endoscopy March 2009   mild erosive reflux esophagitis, Schatzki's ring, s/p dilation  . Schatzki's ring   . Shortness of breath    occ if anxiety  . Wears dentures    upper  . Wears glasses     Past Surgical History:  Procedure Laterality Date  . ANTERIOR AND POSTERIOR REPAIR N/A 05/17/2014   Procedure: ANTERIOR (CYSTOCELE) AND POSTERIOR REPAIR (RECTOCELE);  Surgeon: Reece Packer, MD;  Location: [facility];  Service: Urology;  Laterality: N/A;  . BREAST LUMPECTOMY WITH NEEDLE LOCALIZATION AND AXILLARY SENTINEL LYMPH NODE BX Left 05/03/2013   Procedure: BREAST LUMPECTOMY WITH NEEDLE LOCALIZATION AND AXILLARY SENTINEL LYMPH NODE BX;  Surgeon: Edward Jolly, MD;  Location: [facility];  Service: General;  Laterality: Left;  . BREAST SURGERY Left 05/19/13  . CARDIAC CATHETERIZATION  1998  . COLONOSCOPY  05/06/2006   Diffuse inflammatory changes of the rectal mucosa, consistent  with proctitis.  Otherwise, normal colon to terminal ileum  . CYSTOSCOPY N/A 05/17/2014   Procedure: CYSTOSCOPY;  Surgeon: Reece Packer, MD;  Location: [facility];  Service: Urology;  Laterality: N/A;  . ESOPHAGOGASTRODUODENOSCOPY  02/09/2008   Schatzki ring status post dilation/Distal esophageal erosion consistent with mild erosive reflux  esophagitis, otherwise unremarkable esophagus,  normal stomach, D1, D2.  . EYE SURGERY     lt cataract-implant  . EYE SURGERY     lt-lens repaced,l  . Kara Woods SURGERY  1993  . RE-EXCISION OF BREAST LUMPECTOMY Left 05/19/2013   Procedure: RE-EXCISION OF BREAST LUMPECTOMY;  Surgeon: Edward Jolly, MD;  Location: [facility];  Service: General;  Laterality: Left;  . RETINAL DETACHMENT SURGERY  1978   Left  . VAGINAL HYSTERECTOMY Bilateral 05/17/2014   Procedure: HYSTERECTOMY VAGINAL with Bilateral  Salpingo-Oophorectomy; Bladder cystotomy repair;  Surgeon: Marvene Staff, MD;  Location: [facility];  Service: Gynecology;  Laterality: Bilateral;  . VAGINAL PROLAPSE REPAIR N/A 05/17/2014   Procedure: VAGINAL VAULT PROLAPSE AND GRAFT;  Surgeon: Reece Packer, MD;  Location: [facility];  Service: Urology;  Laterality: N/A;    Current Medications: No facility-administered medications prior to visit.    Outpatient Medications Prior to Visit  Medication Sig Dispense Refill  . acetaminophen (TYLENOL) 500 MG tablet Take 500-1,000 mg by mouth every 6 (six) hours as needed for mild pain.    . Travoprost, BAK Free, (TRAVATAN) 0.004 % SOLN ophthalmic solution Place 1 drop into the right eye at bedtime.     . clorazepate (TRANXENE) 7.5 MG tablet Take 7.5 mg by mouth 2 (two) times daily as needed for anxiety.    . naproxen (NAPROSYN) 375 MG tablet Take 1 tablet (375 mg total) by mouth 2 (two) times daily. 14 tablet 0     Allergies:   Contrast media [iodinated diagnostic agents] and Sulfonamide derivatives   Social History   Social History  . Marital status: Widowed    Spouse name: N/A  . Number of children: N/A  . Years of education: N/A   Social History Main Topics  . Smoking status: Former Smoker    Packs/day: 1.00    Types: Cigarettes    Quit date: 04/28/1990  . Smokeless tobacco: Never Used     Comment: quit 19 yrs ago  . Alcohol use Yes     Comment: a glass of wine once or twice a week  . Drug use: No  . Sexual activity: Not Currently    Birth control/ protection: Post-menopausal   Other Topics Concern  . None   Social History Narrative  . None     Family History:  The patient's family history includes Cancer in her brother.  I mother died and had a myocardial infarction.  Her father also had suffered a myocardial infarction.  She had 3 sisters who also had myocardial infarction and also one of her brothers also had suffered a myocardial infarction.  ROS General:  Negative; No fevers, chills, or night sweats;  HEENT: History of retinal detachment, sinus congestion, difficulty swallowing Pulmonary: Negative; No cough, wheezing, shortness of breath, hemoptysis Cardiovascular: Negative; No chest pain, presyncope, syncope, palpitations GI: Negative; No nausea, vomiting, diarrhea, or abdominal pain GU: Negative; No dysuria, hematuria, or difficulty voiding Musculoskeletal: Negative; no myalgias, joint pain, or weakness Hematologic/Oncology: Negative; no easy bruising, bleeding Endocrine: Negative; no heat/cold intolerance; no diabetes Neuro: Negative; no changes in balance, headaches Skin: Negative; No rashes or skin lesions Psychiatric: Negative; No behavioral problems, depression Sleep: Negative; No snoring, daytime sleepiness, hypersomnolence, bruxism, restless legs, hypnogognic hallucinations, no cataplexy Other comprehensive 14 point system review is negative.   PHYSICAL EXAM:   VS:  BP 129/81   Pulse 74   Ht 5' 6.5" (1.689 m)   Wt 169 lb 3.2 oz (76.7 kg)  BMI 26.90 kg/m    Wt Readings from Last 3 Encounters:  11/04/16 166 lb (75.3 kg)  10/31/16 169 lb 3.2 oz (76.7 kg)  09/01/16 160 lb (72.6 kg)    General: Alert, oriented, no distress.  Skin: normal turgor, no rashes, warm and dry HEENT: Normocephalic, atraumatic. Pupils equal round and reactive to light; sclera anicteric; extraocular muscles intact; Fundi Disks flat.  No hemorrhages or exudates. Nose without nasal septal hypertrophy Mouth/Parynx benign; Mallinpatti scale 3 Neck: No JVD, no carotid bruits; normal carotid upstroke Lungs: clear to ausculatation and percussion; no wheezing or rales Chest wall: without tenderness to palpitation Heart: PMI not displaced, RRR, s1 s2 normal, 1/6 systolic murmur, no diastolic murmur, no rubs, gallops, thrills, or heaves Abdomen: soft, nontender; no hepatosplenomehaly, BS+; abdominal aorta nontender and not dilated by palpation. Back: no CVA  tenderness Pulses 2+ Musculoskeletal: full range of motion, normal strength, no joint deformities Extremities: no clubbing cyanosis or edema, Homan's sign negative  Neurologic: grossly nonfocal; Cranial nerves grossly wnl Psychologic: Normal mood and affect   Studies/Labs Reviewed:   ECG (independently read by me): Normal sinus rhythm at 74 bpm.  No ectopy.  QTc interval 455 ms.  Recent Labs: BMP Latest Ref Rng & Units 10/31/2016 09/01/2016 05/28/2014  Glucose 65 - 99 mg/dL 88 88 100(H)  BUN 7 - 25 mg/dL _0 Creatinine 0.60 - 0.88 mg/dL 0.77 0.75 0.80  Sodium 135 - 146 mmol/L 141 138 142  Potassium 3.5 - 5.3 mmol/L 4.8 4.6 4.0  Chloride 98 - 110 mmol/L 103 107 105  CO2 20 - 31 mmol/L 29 28 -  Calcium 8.6 - 10.4 mg/dL 9.4 8.6(L) -     Hepatic Function Latest Ref Rng & Units 10/31/2016 05/28/2014  Total Protein 6.1 - 8.1 g/dL 7.1 7.7  Albumin 3.6 - 5.1 g/dL 4.3 3.4(L)  AST 10 - 35 U/L 17 15  ALT 6 - 29 U/L 10 9  Alk Phosphatase 33 - 130 U/L 55 75  Total Bilirubin 0.2 - 1.2 mg/dL 0.4 0.3    CBC Latest Ref Rng & Units 10/31/2016 09/01/2016 05/28/2014  WBC 3.8 - 10.8 K/uL 8.8 7.1 -  Hemoglobin 11.7 - 15.5 g/dL 13.8 12.9 13.6  Hematocrit 35.0 - 45.0 % 42.8 39.1 40.0  Platelets 140 - 400 K/uL 270 219 -   Lab Results  Component Value Date   MCV 93.4 10/31/2016   MCV 93.5 09/01/2016   MCV 91.1 05/28/2014   No results found for: TSH No results found for: HGBA1C   BNP    Component Value Date/Time   BNP 51.0 09/01/2016 1012    ProBNP No results found for: PROBNP   Lipid Panel     Component Value Date/Time   CHOL 227 (H) 10/31/2016 1245   TRIG 164 (H) 10/31/2016 1245   HDL 47 (L) 10/31/2016 1245   CHOLHDL 4.8 10/31/2016 1245   VLDL 33 (H) 10/31/2016 1245   LDLCALC 147 (H) 10/31/2016 1245     RADIOLOGY: No results found.   Additional studies/ records that were reviewed today include:  I reviewed the office records of [name].  I reviewed her echo Doppler  study and pulmonary function tests.    ASSESSMENT:    1. Exertional dyspnea   2. Chest pain, unspecified type   3. Chest tightness   4. Screening, lipid   5. History of breast cancer   6. Long term current use of anticoagulant   7. Pre-op testing  8. History of detached retina repair      PLAN:  Ms. Hodaya Curto is an 82 year old Caucasian female who has developed progressive decline in exercise tolerance with significant increasing shortness of breath with minimal activity associated with chest pressure and left arm radiation.  In 1998, reportedly a cardiac catheterization had shown normal coronary arteries.  She has been found to have read ejection fraction of 50% with diffuse hypocontractility on echo Doppler assessment.  Her pulmonary function studies.  Has not identified significant pulmonary disability in the etiology of her dyspnea.  Her very strong family history of myocardial infarctions in multiple family members, as well as remote tobacco history and progressive changes in symptomatology.  It is my recommendation that definitive evaluation with cardiac catheterization be performed.  At this point, I do not believe noninvasive strategy will provide the absolute certainty that the patient needs if the study was abnormal.  I have started her on isosorbide mononitrate 30 mg daily as well as metoprolol, tartrate 12.5 mg twice a day.  I discussed the cardiac catheterization procedure in detail with the patient.I have reviewed the risks, indications, and alternatives to cardiac catheterization, possible angioplasty, and stenting with the patient. Risks include but are not limited to bleeding, infection, vascular injury, stroke, myocardial infection, arrhythmia, kidney injury, radiation-related injury in the case of prolonged fluoroscopy use, emergency cardiac surgery, and death. The patient understands the risks of serious complication is 1-2 in 9244 with diagnostic cardiac cath and 1-2% or  less with angioplasty/stenting.  She has agreed to undergo this procedure.    Medication Adjustments/Labs and Tests Ordered: Current medicines are reviewed at length with the patient today.  Concerns regarding medicines are outlined above.  Medication changes, Labs and Tests ordered today are listed in the Patient Instructions below.\  Patient Instructions  Your physician has requested that you have a cardiac catheterization. Cardiac catheterization is used to diagnose and/or treat various heart conditions. Doctors may recommend this procedure for a number of different reasons. The most common reason is to evaluate chest pain. Chest pain can be a symptom of coronary artery disease (CAD), and cardiac catheterization can show whether plaque is narrowing or blocking your heart's arteries. This procedure is also used to evaluate the valves, as well as measure the blood flow and oxygen levels in different parts of your heart. For further information please visit [email or web address].  This will be done Monday December 11th by Dr Claiborne Woods.  Following your catheterization, you will not be allowed to drive for 3 days.  No lifting, pushing, or pulling greater that 10 pounds is allowed for 1 week.  You will be required to have the following tests prior to the procedure:  1. Blood work-the blood work can be done no more than 7 days prior to the procedure.  It can be done at any [facility] lab.  There is one downstairs on the first floor of this building and one in the [facility] building [phone])  Your physician has recommended you make the following change in your medication:   1.) start new prescription for isosorbide 30 mg and lopressor 12. 5 mg daily. These prescriptions has been sent to your pharmacy.  Dr Claiborne Woods also recommends that you begin Asiprin 81 mg daily.     Signed, Shelva Majestic, MD  11/04/2016 7:52 AM    [facility] [address] [place], [place], [place]  [address] Phone: [phone]

## 2016-11-05 ENCOUNTER — Encounter (HOSPITAL_COMMUNITY): Payer: Self-pay | Admitting: Cardiovascular Disease

## 2016-11-05 ENCOUNTER — Encounter (HOSPITAL_COMMUNITY)
Admission: RE | Disposition: A | Discharge status: Home / Self Care | Payer: Self-pay | Source: Ambulatory Visit | Attending: Cardiovascular Disease

## 2016-11-05 ENCOUNTER — Other Ambulatory Visit: Payer: Self-pay

## 2016-11-05 DIAGNOSIS — I200 Unstable angina: Secondary | ICD-10-CM

## 2016-11-05 DIAGNOSIS — E782 Mixed hyperlipidemia: Secondary | ICD-10-CM

## 2016-11-05 HISTORY — PX: CARDIAC CATHETERIZATION: SHX172

## 2016-11-05 LAB — CBC
HCT: 37.4 % (ref 36.0–46.0)
Hemoglobin: 12.1 g/dL (ref 12.0–15.0)
MCH: 30.2 pg (ref 26.0–34.0)
MCHC: 32.4 g/dL (ref 30.0–36.0)
MCV: 93.3 fL (ref 78.0–100.0)
Platelets: 224 10*3/uL (ref 150–400)
RBC: 4.01 MIL/uL (ref 3.87–5.11)
RDW: 14 % (ref 11.5–15.5)
WBC: 13.1 10*3/uL — ABNORMAL HIGH (ref 4.0–10.5)

## 2016-11-05 LAB — BASIC METABOLIC PANEL
Anion gap: 8 (ref 5–15)
BUN: 8 mg/dL (ref 6–20)
CO2: 22 mmol/L (ref 22–32)
Calcium: 8.9 mg/dL (ref 8.9–10.3)
Chloride: 110 mmol/L (ref 101–111)
Creatinine, Ser: 0.69 mg/dL (ref 0.44–1.00)
GFR calc Af Amer: >60 mL/min (ref >60)
GFR calc non Af Amer: >60 mL/min (ref >60)
Glucose, Bld: 114 mg/dL — ABNORMAL HIGH (ref 65–99)
Potassium: 3.6 mmol/L (ref 3.5–5.1)
Sodium: 140 mmol/L (ref 135–145)

## 2016-11-05 LAB — POCT ACTIVATED CLOTTING TIME: Activated Clotting Time: 395 seconds

## 2016-11-05 SURGERY — CORONARY STENT INTERVENTION

## 2016-11-05 MED ORDER — IOPAMIDOL (ISOVUE-370) INJECTION 76%
INTRAVENOUS | Status: DC | PRN
  Administered 2016-11-05: 140 mL via INTRAVENOUS

## 2016-11-05 MED ORDER — DIPHENHYDRAMINE HCL 50 MG/ML IJ SOLN
25.0000 mg | INTRAMUSCULAR | Status: AC
  Administered 2016-11-05: 25 mg via INTRAVENOUS
  Filled 2016-11-05: qty 1

## 2016-11-05 MED ORDER — TICAGRELOR 90 MG PO TABS: 90.0000 mg | ORAL_TABLET | Freq: Two times a day (BID) | ORAL | Status: DC

## 2016-11-05 MED ORDER — SODIUM CHLORIDE 0.9% FLUSH: 3.0000 mL | INTRAVENOUS | Status: DC | PRN

## 2016-11-05 MED ORDER — HEPARIN (PORCINE) IN NACL 2-0.9 UNIT/ML-% IJ SOLN
INTRAMUSCULAR | Status: AC
  Filled 2016-11-05: qty 1000

## 2016-11-05 MED ORDER — FENTANYL CITRATE (PF) 100 MCG/2ML IJ SOLN
INTRAMUSCULAR | Status: DC | PRN
  Administered 2016-11-05 (×2): 25 ug via INTRAVENOUS

## 2016-11-05 MED ORDER — ACETAMINOPHEN 325 MG PO TABS: 650.0000 mg | ORAL_TABLET | ORAL | Status: DC | PRN

## 2016-11-05 MED ORDER — DIAZEPAM 5 MG PO TABS: 5.0000 mg | ORAL_TABLET | Freq: Four times a day (QID) | ORAL | Status: DC | PRN

## 2016-11-05 MED ORDER — HYDRALAZINE HCL 20 MG/ML IJ SOLN
5.0000 mg | INTRAMUSCULAR | Status: AC | PRN
  Administered 2016-11-05: 14:00:00 5 mg via INTRAVENOUS
  Filled 2016-11-05: qty 1

## 2016-11-05 MED ORDER — LIDOCAINE HCL (PF) 1 % IJ SOLN
INTRAMUSCULAR | Status: DC | PRN
  Administered 2016-11-05: 18 mL

## 2016-11-05 MED ORDER — IOPAMIDOL (ISOVUE-370) INJECTION 76%
INTRAVENOUS | Status: AC
  Filled 2016-11-05: qty 125

## 2016-11-05 MED ORDER — NITROGLYCERIN 1 MG/10 ML FOR IR/CATH LAB
INTRA_ARTERIAL | Status: DC | PRN
Start: 2016-11-05 — End: 2016-11-05
  Administered 2016-11-05 (×5): 200 ug via INTRACORONARY

## 2016-11-05 MED ORDER — BIVALIRUDIN 250 MG IV SOLR
INTRAVENOUS | Status: DC | PRN
  Administered 2016-11-05: 1.75 mg/kg/h via INTRAVENOUS

## 2016-11-05 MED ORDER — ACETAMINOPHEN 500 MG PO TABS: 500.0000 mg | ORAL_TABLET | Freq: Four times a day (QID) | ORAL | Status: DC | PRN

## 2016-11-05 MED ORDER — LIDOCAINE HCL (PF) 1 % IJ SOLN
INTRAMUSCULAR | Status: AC
  Filled 2016-11-05: qty 30

## 2016-11-05 MED ORDER — SODIUM CHLORIDE 0.9 % IV SOLN: 250.0000 mL | INTRAVENOUS | Status: DC | PRN

## 2016-11-05 MED ORDER — TICAGRELOR 90 MG PO TABS
ORAL_TABLET | ORAL | Status: AC
  Filled 2016-11-05: qty 1

## 2016-11-05 MED ORDER — BIVALIRUDIN BOLUS VIA INFUSION - CUPID
INTRAVENOUS | Status: DC | PRN
Start: 2016-11-05 — End: 2016-11-05
  Administered 2016-11-05: 57 mg via INTRAVENOUS

## 2016-11-05 MED ORDER — ASPIRIN 81 MG PO CHEW
81.0000 mg | CHEWABLE_TABLET | ORAL | Status: AC
  Administered 2016-11-05: 81 mg via ORAL
  Filled 2016-11-05: qty 1

## 2016-11-05 MED ORDER — METHYLPREDNISOLONE SODIUM SUCC 125 MG IJ SOLR
125.0000 mg | INTRAMUSCULAR | Status: AC
  Administered 2016-11-05: 125 mg via INTRAVENOUS
  Filled 2016-11-05: qty 2

## 2016-11-05 MED ORDER — BIVALIRUDIN 250 MG IV SOLR
INTRAVENOUS | Status: AC
  Filled 2016-11-05: qty 250

## 2016-11-05 MED ORDER — SODIUM CHLORIDE 0.9% FLUSH
3.0000 mL | Freq: Two times a day (BID) | INTRAVENOUS | Status: DC
  Administered 2016-11-05: 3 mL via INTRAVENOUS

## 2016-11-05 MED ORDER — SODIUM CHLORIDE 0.9 % WEIGHT BASED INFUSION
3.0000 mL/kg/h | INTRAVENOUS | Status: DC
  Administered 2016-11-05: 08:00:00 3 mL/kg/h via INTRAVENOUS

## 2016-11-05 MED ORDER — TRAMADOL HCL 50 MG PO TABS
50.0000 mg | ORAL_TABLET | Freq: Four times a day (QID) | ORAL | Status: DC | PRN
  Administered 2016-11-05: 13:00:00 50 mg via ORAL
  Filled 2016-11-05: qty 1

## 2016-11-05 MED ORDER — FENTANYL CITRATE (PF) 100 MCG/2ML IJ SOLN
INTRAMUSCULAR | Status: AC
  Filled 2016-11-05: qty 2

## 2016-11-05 MED ORDER — ASPIRIN 81 MG PO CHEW: 81.0000 mg | CHEWABLE_TABLET | Freq: Every day | ORAL | Status: DC

## 2016-11-05 MED ORDER — FAMOTIDINE IN NACL 20-0.9 MG/50ML-% IV SOLN
20.0000 mg | INTRAVENOUS | Status: AC
  Administered 2016-11-05: 08:00:00 20 mg via INTRAVENOUS
  Filled 2016-11-05: qty 50

## 2016-11-05 MED ORDER — HEPARIN (PORCINE) IN NACL 2-0.9 UNIT/ML-% IJ SOLN
INTRAMUSCULAR | Status: DC | PRN
  Administered 2016-11-05: 1000 mL

## 2016-11-05 MED ORDER — LABETALOL HCL 5 MG/ML IV SOLN: 10.0000 mg | INTRAVENOUS | Status: AC | PRN

## 2016-11-05 MED ORDER — SODIUM CHLORIDE 0.9 % IV SOLN
INTRAVENOUS | Status: DC
  Administered 2016-11-05 (×2): via INTRAVENOUS

## 2016-11-05 MED ORDER — MIDAZOLAM HCL 2 MG/2ML IJ SOLN
INTRAMUSCULAR | Status: AC
  Filled 2016-11-05: qty 2

## 2016-11-05 MED ORDER — METOPROLOL TARTRATE 25 MG PO TABS
25.0000 mg | ORAL_TABLET | Freq: Two times a day (BID) | ORAL | Status: DC
  Administered 2016-11-05 – 2016-11-06 (×2): 25 mg via ORAL
  Filled 2016-11-05 (×2): qty 1

## 2016-11-05 MED ORDER — NITROGLYCERIN 1 MG/10 ML FOR IR/CATH LAB
INTRA_ARTERIAL | Status: AC
  Filled 2016-11-05: qty 10

## 2016-11-05 MED ORDER — ALBUTEROL SULFATE HFA 108 (90 BASE) MCG/ACT IN AERS: 1.0000 | INHALATION_SPRAY | RESPIRATORY_TRACT | Status: DC | PRN

## 2016-11-05 MED ORDER — MIDAZOLAM HCL 2 MG/2ML IJ SOLN
INTRAMUSCULAR | Status: DC | PRN
Start: 2016-11-05 — End: 2016-11-05
  Administered 2016-11-05 (×2): 1 mg via INTRAVENOUS

## 2016-11-05 MED ORDER — IOPAMIDOL (ISOVUE-370) INJECTION 76%
INTRAVENOUS | Status: AC
  Filled 2016-11-05: qty 50

## 2016-11-05 MED ORDER — ALBUTEROL SULFATE (2.5 MG/3ML) 0.083% IN NEBU
2.5000 mg | INHALATION_SOLUTION | RESPIRATORY_TRACT | Status: DC | PRN
  Administered 2016-11-05: 13:00:00 2.5 mg via RESPIRATORY_TRACT
  Filled 2016-11-05: qty 3

## 2016-11-05 MED ORDER — ACETAMINOPHEN 325 MG PO TABS
650.0000 mg | ORAL_TABLET | Freq: Four times a day (QID) | ORAL | Status: DC | PRN
  Administered 2016-11-05 (×2): 650 mg via ORAL
  Administered 2016-11-06: 325 mg via ORAL
  Filled 2016-11-05 (×2): qty 2

## 2016-11-05 MED ORDER — ONDANSETRON HCL 4 MG/2ML IJ SOLN: 4.0000 mg | Freq: Four times a day (QID) | INTRAMUSCULAR | Status: DC | PRN

## 2016-11-05 MED ORDER — SODIUM CHLORIDE 0.9 % WEIGHT BASED INFUSION
1.0000 mL/kg/h | INTRAVENOUS | Status: DC
  Administered 2016-11-05: 09:00:00 1 mL/kg/h via INTRAVENOUS

## 2016-11-05 SURGICAL SUPPLY — 14 items
BALLN EMERGE MR 2.5X12 (BALLOONS) ×2
BALLN ~~LOC~~ MOZEC 3.0X15 (BALLOONS) ×2
BALLOON EMERGE MR 2.5X12 (BALLOONS) ×1 IMPLANT
BALLOON ~~LOC~~ MOZEC 3.0X15 (BALLOONS) ×1 IMPLANT
CATH VISTA GUIDE 6FR XB3.5 (CATHETERS) ×2 IMPLANT
KIT ENCORE 26 ADVANTAGE (KITS) ×2 IMPLANT
KIT HEART LEFT (KITS) ×2 IMPLANT
PACK CARDIAC CATHETERIZATION (CUSTOM PROCEDURE TRAY) ×2 IMPLANT
SHEATH PINNACLE 6F 10CM (SHEATH) ×2 IMPLANT
STENT XIENCE ALPINE RX 2.75X23 (Permanent Stent) ×2 IMPLANT
TRANSDUCER W/STOPCOCK (MISCELLANEOUS) ×2 IMPLANT
TUBING CIL FLEX 10 FLL-RA (TUBING) ×2 IMPLANT
WIRE EMERALD 3MM-J .035X150CM (WIRE) ×2 IMPLANT
WIRE PT2 MS 185 (WIRE) ×2 IMPLANT

## 2016-11-05 NOTE — Progress Notes (Signed)
Site area: right groin  Site Prior to Removal:  Level 0  Pressure Applied For 20 MINUTES    Minutes Beginning at 1435   Manual:   Yes.    Patient Status During Pull:  stable  Post Pull Groin Site:  Level 0  Post Pull Instructions Given:  Yes.    Post Pull Pulses Present:  Yes.    Dressing Applied:  Yes.    Comments:

## 2016-11-05 NOTE — H&P (View-Only) (Signed)
Patient Name: Kara Woods Date of Encounter: 11/05/2016  Primary Cardiologist: Dr. Clearnce Hasten Problem List     Active Problems:   Unstable angina [facility])    Subjective   Feeling well this morning.   Inpatient Medications    Scheduled Meds: . aspirin  81 mg Oral Daily  . aspirin  81 mg Oral Pre-Cath  . atorvastatin  80 mg Oral q1800  . diphenhydrAMINE  25 mg Intravenous Pre-Cath  . famotidine (PEPCID) IV  20 mg Intravenous Pre-Cath  . isosorbide mononitrate  30 mg Oral Daily  . methylPREDNISolone (SOLU-MEDROL) injection  125 mg Intravenous Pre-Cath  . metoprolol tartrate  12.5 mg Oral BID  . sodium chloride flush  3 mL Intravenous Q12H  . sodium chloride flush  3 mL Intravenous Q12H  . ticagrelor  90 mg Oral BID   Continuous Infusions: . sodium chloride 75 mL/hr at 11/05/16 0200  . sodium chloride     Followed by  . sodium chloride     PRN Meds: sodium chloride, sodium chloride, acetaminophen, diazepam, ondansetron (ZOFRAN) IV, sodium chloride flush, sodium chloride flush   Vital Signs    Vitals:   11/04/16 1921 11/04/16 2000 11/05/16 0239 11/05/16 0724  BP: 140/61  (!) 136/51 (!) 161/71  Pulse: 73 70 66 74  Resp: 15 17 18  (!) 22  Temp: 97.5 F (36.4 C)  98.2 F (36.8 C) 97.3 F (36.3 C)  TempSrc: Oral  Oral Axillary  SpO2: 94% 93% 97% 98%  Weight:   167 lb 8.8 oz (76 kg)   Height:        Intake/Output Summary (Last 24 hours) at 11/05/16 0747 Last data filed at 11/05/16 0241  Gross per 24 hour  Intake           1877.5 ml  Output             1350 ml  Net            527.5 ml   Filed Weights   11/04/16 0552 11/05/16 0239  Weight: 166 lb (75.3 kg) 167 lb 8.8 oz (76 kg)    Physical Exam    GEN: Well nourished, well developed, in no acute distress.  HEENT: Grossly normal.  Neck: Supple, no JVD, carotid bruits, or masses. Cardiac: RRR, no murmurs, rubs, or gallops. No clubbing, cyanosis, edema.  Radials/DP/PT 2+ and equal bilaterally.    Respiratory:  Respirations regular and unlabored, clear to auscultation bilaterally. GI: Soft, nontender, nondistended, BS + x 4. MS: no deformity or atrophy. Skin: warm and dry, no rash. Right wrist without bruising or hematoma Neuro:  Strength and sensation are intact. Psych: AAOx3.  Normal affect.  Labs    CBC  Recent Labs  11/05/16 0210  WBC 13.1*  HGB 12.1  HCT 37.4  MCV 93.3  PLT [id number]   Basic Metabolic Panel  Recent Labs  11/05/16 0210  NA 140  K 3.6  CL 110  CO2 22  GLUCOSE 114*  BUN 8  CREATININE 0.69  CALCIUM 8.9   Liver Function Tests No results for input(s): AST, ALT, ALKPHOS, BILITOT, PROT, ALBUMIN in the last 72 hours. No results for input(s): LIPASE, AMYLASE in the last 72 hours. Cardiac Enzymes No results for input(s): CKTOTAL, CKMB, CKMBINDEX, TROPONINI in the last 72 hours. BNP Invalid input(s): POCBNP D-Dimer No results for input(s): DDIMER in the last 72 hours. Hemoglobin A1C No results for input(s): HGBA1C in the last 72 hours. Fasting Lipid Panel  No results for input(s): CHOL, HDL, LDLCALC, TRIG, CHOLHDL, LDLDIRECT in the last 72 hours. Thyroid Function Tests No results for input(s): TSH, T4TOTAL, T3FREE, THYROIDAB in the last 72 hours.  Invalid input(s): FREET3  Telemetry    SR - Personally Reviewed  ECG    SR - Personally Reviewed  Radiology    No results found.  Cardiac Studies   LHC: 11/04/16  Conclusion     The left ventricular systolic function is normal.  LV end diastolic pressure is normal.  The left ventricular ejection fraction is 50-55% by visual estimate.  Prox LAD to Mid LAD lesion, 50 %stenosed.  Mid LAD lesion, 45 %stenosed.  Dist LAD lesion, 45 %stenosed.  Mid Cx lesion, 80 %stenosed.  Mid RCA-2 lesion, 50 %stenosed.  A STENT RESOLUTE ONYX [id number] drug eluting stent was successfully placed.  Mid RCA-1 lesion, 95 %stenosed.  Post intervention, there is a 0% residual stenosis.  Dist RCA  lesion, 30 %stenosed.   Low normal global LV function with an ejection fraction of 50-55% with a mild region of mid inferior focal hypocontractility.  Multi-vessel CAD with multiple stenoses of 40-50% in the mid to mid-distal LAD; normal ramus intermediate vessel; 80% focal stenosis in the left circumflex coronary artery; and  a 95% eccentric mid RCA stenosis followed by 50% narrowing.   Successful percutaneous coronary intervention to the mid RCA with ultimate insertion of a 2.2512 mm Resolute Onyx stent postdilated to 2.5 mm with the 95 and 50% stenoses being reduced to 0%.  RECOMMENDATION: The patient will continue on dual antiplatelet therapy.  She will be hydrated overnight.  Plan for staged PCI to the left circumflex 80% stenosis tomorrow, and medical therapy for concomitant segmental LAD stenoses.    Patient Profile     82 yo female with PMH of depression, GERD, breast CA who presented to the office with [name] after being referred by [name] for exertional dyspnea and chest discomfort. Decision was made to proceed with cardiac cath.   Assessment & Plan    1.  Chest pain/CAD: She underwent LHC yesterday with [name] showing mulit-vessel disease with 40-50% in the mid to distal LAD, 80% in the LCx, and 95% in mid RCA. She was treated with DESx1 to the RCA. She was started on DAPT (ASA and Brilinta) and planned for staged procedure today on the LCx. Labs stable post cath yesterday.  -- Reports feeling much better after having the cat yesterday.   2. HLD: On atorvastatin 80mg   Signed, Reino Bellis, NP-C 11/05/2016, 7:47 AM   I have examined the patient and reviewed assessment and plan and discussed with patient.  Agree with above as stated.  Plan for PCI of the circ today.  2+ right radial pulse present with some bruising.  Larae Grooms

## 2016-11-05 NOTE — Progress Notes (Signed)
Patient Name: Kara Woods Date of Encounter: 11/05/2016  Primary Cardiologist: Dr. Clearnce Hasten Problem List     Active Problems:   Unstable angina [facility])    Subjective   Feeling well this morning.   Inpatient Medications    Scheduled Meds: . aspirin  81 mg Oral Daily  . aspirin  81 mg Oral Pre-Cath  . atorvastatin  80 mg Oral q1800  . diphenhydrAMINE  25 mg Intravenous Pre-Cath  . famotidine (PEPCID) IV  20 mg Intravenous Pre-Cath  . isosorbide mononitrate  30 mg Oral Daily  . methylPREDNISolone (SOLU-MEDROL) injection  125 mg Intravenous Pre-Cath  . metoprolol tartrate  12.5 mg Oral BID  . sodium chloride flush  3 mL Intravenous Q12H  . sodium chloride flush  3 mL Intravenous Q12H  . ticagrelor  90 mg Oral BID   Continuous Infusions: . sodium chloride 75 mL/hr at 11/05/16 0200  . sodium chloride     Followed by  . sodium chloride     PRN Meds: sodium chloride, sodium chloride, acetaminophen, diazepam, ondansetron (ZOFRAN) IV, sodium chloride flush, sodium chloride flush   Vital Signs    Vitals:   11/04/16 1921 11/04/16 2000 11/05/16 0239 11/05/16 0724  BP: 140/61  (!) 136/51 (!) 161/71  Pulse: 73 70 66 74  Resp: 15 17 18  (!) 22  Temp: 97.5 F (36.4 C)  98.2 F (36.8 C) 97.3 F (36.3 C)  TempSrc: Oral  Oral Axillary  SpO2: 94% 93% 97% 98%  Weight:   167 lb 8.8 oz (76 kg)   Height:        Intake/Output Summary (Last 24 hours) at 11/05/16 0747 Last data filed at 11/05/16 0241  Gross per 24 hour  Intake           1877.5 ml  Output             1350 ml  Net            527.5 ml   Filed Weights   11/04/16 0552 11/05/16 0239  Weight: 166 lb (75.3 kg) 167 lb 8.8 oz (76 kg)    Physical Exam    GEN: Well nourished, well developed, in no acute distress.  HEENT: Grossly normal.  Neck: Supple, no JVD, carotid bruits, or masses. Cardiac: RRR, no murmurs, rubs, or gallops. No clubbing, cyanosis, edema.  Radials/DP/PT 2+ and equal bilaterally.    Respiratory:  Respirations regular and unlabored, clear to auscultation bilaterally. GI: Soft, nontender, nondistended, BS + x 4. MS: no deformity or atrophy. Skin: warm and dry, no rash. Right wrist without bruising or hematoma Neuro:  Strength and sensation are intact. Psych: AAOx3.  Normal affect.  Labs    CBC  Recent Labs  11/05/16 0210  WBC 13.1*  HGB 12.1  HCT 37.4  MCV 93.3  PLT [id number]   Basic Metabolic Panel  Recent Labs  11/05/16 0210  NA 140  K 3.6  CL 110  CO2 22  GLUCOSE 114*  BUN 8  CREATININE 0.69  CALCIUM 8.9   Liver Function Tests No results for input(s): AST, ALT, ALKPHOS, BILITOT, PROT, ALBUMIN in the last 72 hours. No results for input(s): LIPASE, AMYLASE in the last 72 hours. Cardiac Enzymes No results for input(s): CKTOTAL, CKMB, CKMBINDEX, TROPONINI in the last 72 hours. BNP Invalid input(s): POCBNP D-Dimer No results for input(s): DDIMER in the last 72 hours. Hemoglobin A1C No results for input(s): HGBA1C in the last 72 hours. Fasting Lipid Panel  No results for input(s): CHOL, HDL, LDLCALC, TRIG, CHOLHDL, LDLDIRECT in the last 72 hours. Thyroid Function Tests No results for input(s): TSH, T4TOTAL, T3FREE, THYROIDAB in the last 72 hours.  Invalid input(s): FREET3  Telemetry    SR - Personally Reviewed  ECG    SR - Personally Reviewed  Radiology    No results found.  Cardiac Studies   LHC: 11/04/16  Conclusion     The left ventricular systolic function is normal.  LV end diastolic pressure is normal.  The left ventricular ejection fraction is 50-55% by visual estimate.  Prox LAD to Mid LAD lesion, 50 %stenosed.  Mid LAD lesion, 45 %stenosed.  Dist LAD lesion, 45 %stenosed.  Mid Cx lesion, 80 %stenosed.  Mid RCA-2 lesion, 50 %stenosed.  A STENT RESOLUTE ONYX [id number] drug eluting stent was successfully placed.  Mid RCA-1 lesion, 95 %stenosed.  Post intervention, there is a 0% residual stenosis.  Dist RCA  lesion, 30 %stenosed.   Low normal global LV function with an ejection fraction of 50-55% with a mild region of mid inferior focal hypocontractility.  Multi-vessel CAD with multiple stenoses of 40-50% in the mid to mid-distal LAD; normal ramus intermediate vessel; 80% focal stenosis in the left circumflex coronary artery; and  a 95% eccentric mid RCA stenosis followed by 50% narrowing.   Successful percutaneous coronary intervention to the mid RCA with ultimate insertion of a 2.2512 mm Resolute Onyx stent postdilated to 2.5 mm with the 95 and 50% stenoses being reduced to 0%.  RECOMMENDATION: The patient will continue on dual antiplatelet therapy.  She will be hydrated overnight.  Plan for staged PCI to the left circumflex 80% stenosis tomorrow, and medical therapy for concomitant segmental LAD stenoses.    Patient Profile     82 yo female with PMH of depression, GERD, breast CA who presented to the office with [name] after being referred by [name] for exertional dyspnea and chest discomfort. Decision was made to proceed with cardiac cath.   Assessment & Plan    1.  Chest pain/CAD: She underwent LHC yesterday with [name] showing mulit-vessel disease with 40-50% in the mid to distal LAD, 80% in the LCx, and 95% in mid RCA. She was treated with DESx1 to the RCA. She was started on DAPT (ASA and Brilinta) and planned for staged procedure today on the LCx. Labs stable post cath yesterday.  -- Reports feeling much better after having the cat yesterday.   2. HLD: On atorvastatin 80mg   Signed, Reino Bellis, NP-C 11/05/2016, 7:47 AM   I have examined the patient and reviewed assessment and plan and discussed with patient.  Agree with above as stated.  Plan for PCI of the circ today.  2+ right radial pulse present with some bruising.  Larae Grooms

## 2016-11-05 NOTE — Care Management Note (Signed)
Case Management Note  Patient Details  Name: Kara Woods MRN: [id number] Date of Birth: 1933/05/23  Subjective/Objective:   S/p intervention, pta indep, NCM gave patient the 30 day savings card for brilinta, her co pay is 45.00, she will go to [employer] in [place] to get the 30 day free and they do have in stock.  She has no other needs.                  Action/Plan:   Expected Discharge Date:                  Expected Discharge Plan:  Home/Self Care  In-House Referral:     Discharge planning Services  CM Consult  Post Acute Care Choice:    Choice offered to:     DME Arranged:    DME Agency:     HH Arranged:    HH Agency:     Status of Service:  Completed, signed off  If discussed at [employer] of Stay Meetings, dates discussed:    Additional Comments:  Zenon Mayo, RN 11/05/2016, 9:27 AM

## 2016-11-05 NOTE — Progress Notes (Signed)
CARDIAC REHAB PHASE I   Attempted to ambulate with pt, begin education, pt declined, states she would prefer to wait until later. Pt for staged PCI today. Pt in bed, call bell within reach. Will follow up tomorrow morning.   Lenna Sciara, RN, BSN 11/05/2016 8:26 AM

## 2016-11-05 NOTE — Interval H&P Note (Signed)
Cath Lab Visit (complete for each Cath Lab visit)  Clinical Evaluation Leading to the Procedure:   ACS: No.  Non-ACS:    Anginal Classification: CCS III  Anti-ischemic medical therapy: Maximal Therapy (2 or more classes of medications)  Non-Invasive Test Results: No non-invasive testing performed  Prior CABG: No previous CABG      History and Physical Interval Note:  11/05/2016 10:16 AM  Kara Woods  has presented today for surgery, with the diagnosis of cad  The various methods of treatment have been discussed with the patient and family. After consideration of risks, benefits and other options for treatment, the patient has consented to  Procedure(s): Coronary Stent Intervention (N/A) as a surgical intervention .  The patient's history has been reviewed, patient examined, no change in status, stable for surgery.  I have reviewed the patient's chart and labs.  Questions were answered to the patient's satisfaction.     Shelva Majestic

## 2016-11-05 NOTE — Consult Note (Signed)
[facility] CM Primary Care Navigator  11/05/2016  Kara Woods [birth year] [id number]  Met with patient and daughter Kara Woods) at the bedside to identify possible discharge needs. Patient reports that increasing shortness of breath with minimal activity was the reason that led to this admission/surgery.  MD note reveals, her pulmonary function tests (PFT) has not identified significant pulmonary disability/ abnormality in relation to her dyspnea thus cardiac evaluation was done resulting to cardiac catheterization and Coronary Stent Intervention.  Patient endorses Dr. Edwinna Areola. Hall with Delphina Cahill MD office as the primary care provider.    Patient shared using [employer] in [place] to obtain medications without any problem.  Patient has not been on any prescription medications at home per daughter. Patient states taking medications on as needed basis and manages that on her own as stated.  Patient was independent with self care and is able to drive prior to admission. Her daughters and sons will be able to provide transportation to her doctors' appointments.  Her children will be her primary caregivers at home and will alternately assist with her care needs. Daughter reports that patient has good support from her family.  Discharge plan is home with children's assistance per daughter.  Patient and daughter voiced understanding to call primary care provider's office when she returns home, for a post discharge follow-up appointment within a week or sooner if needs arise. Patient letter provided as a reminder.  Both patient and daughter denied any needs or concerns at this time.  For additional questions please contact:  Edwena Felty A. Savahna Casados, BSN, RN-BC [facility] PRIMARY CARE Navigator Cell: [phone]

## 2016-11-06 ENCOUNTER — Telehealth: Payer: Self-pay | Admitting: Cardiovascular Disease

## 2016-11-06 ENCOUNTER — Encounter (HOSPITAL_COMMUNITY): Payer: Self-pay | Admitting: Cardiovascular Disease

## 2016-11-06 DIAGNOSIS — I2510 Atherosclerotic heart disease of native coronary artery without angina pectoris: Secondary | ICD-10-CM

## 2016-11-06 DIAGNOSIS — Z9861 Coronary angioplasty status: Secondary | ICD-10-CM

## 2016-11-06 DIAGNOSIS — I10 Essential (primary) hypertension: Secondary | ICD-10-CM

## 2016-11-06 LAB — CBC
HCT: 36 % (ref 36.0–46.0)
Hemoglobin: 12.2 g/dL (ref 12.0–15.0)
MCH: 31 pg (ref 26.0–34.0)
MCHC: 33.9 g/dL (ref 30.0–36.0)
MCV: 91.4 fL (ref 78.0–100.0)
Platelets: 250 10*3/uL (ref 150–400)
RBC: 3.94 MIL/uL (ref 3.87–5.11)
RDW: 13.9 % (ref 11.5–15.5)
WBC: 13.6 10*3/uL — ABNORMAL HIGH (ref 4.0–10.5)

## 2016-11-06 LAB — BASIC METABOLIC PANEL
Anion gap: 10 (ref 5–15)
BUN: 10 mg/dL (ref 6–20)
CO2: 21 mmol/L — ABNORMAL LOW (ref 22–32)
Calcium: 8.8 mg/dL — ABNORMAL LOW (ref 8.9–10.3)
Chloride: 111 mmol/L (ref 101–111)
Creatinine, Ser: 0.74 mg/dL (ref 0.44–1.00)
GFR calc Af Amer: >60 mL/min (ref >60)
GFR calc non Af Amer: >60 mL/min (ref >60)
Glucose, Bld: 107 mg/dL — ABNORMAL HIGH (ref 65–99)
Potassium: 3.4 mmol/L — ABNORMAL LOW (ref 3.5–5.1)
Sodium: 142 mmol/L (ref 135–145)

## 2016-11-06 MED ORDER — HYDRALAZINE HCL 20 MG/ML IJ SOLN
10.0000 mg | Freq: Once | INTRAMUSCULAR | Status: AC
  Administered 2016-11-06: 10 mg via INTRAVENOUS
  Filled 2016-11-06: qty 1

## 2016-11-06 MED ORDER — POTASSIUM CHLORIDE CRYS ER 20 MEQ PO TBCR
40.0000 meq | EXTENDED_RELEASE_TABLET | Freq: Once | ORAL | Status: AC
  Administered 2016-11-06: 10:00:00 40 meq via ORAL
  Filled 2016-11-06: qty 2

## 2016-11-06 MED ORDER — TICAGRELOR 90 MG PO TABS: 90.0000 mg | ORAL_TABLET | Freq: Two times a day (BID) | ORAL | 10 refills | Status: DC

## 2016-11-06 MED ORDER — ATORVASTATIN CALCIUM 80 MG PO TABS: 80.0000 mg | ORAL_TABLET | Freq: Every day | ORAL | 6 refills | Status: DC

## 2016-11-06 MED ORDER — NITROGLYCERIN 0.4 MG SL SUBL: 0.4000 mg | SUBLINGUAL_TABLET | SUBLINGUAL | 3 refills | Status: AC | PRN

## 2016-11-06 MED ORDER — TICAGRELOR 90 MG PO TABS: 90.0000 mg | ORAL_TABLET | Freq: Two times a day (BID) | ORAL | 0 refills | Status: DC

## 2016-11-06 NOTE — Telephone Encounter (Signed)
TOC discharged 11/06/16

## 2016-11-06 NOTE — Progress Notes (Signed)
Pt anxious and confused.Oriented to person only.+ facial flushing. VS obtained. Pt states she cannot catch her breath O2 at 2L vis N/C . Pt c/o nausea but refusing any medication. Pt believes we are  harming her.

## 2016-11-06 NOTE — Progress Notes (Signed)
Dr Wyatt Haste made aware of B/Ps 170-180/70-77. New orders received. 02:05 Apresoline 10 mg administered IV as ordered.

## 2016-11-06 NOTE — Discharge Summary (Addendum)
Discharge Summary    Patient ID: Kara Woods,  MRN: [id number], DOB/AGE: [birth year] 80 y.o.  Admit date: 11/04/2016 Discharge date: 11/06/2016  Primary Care Provider: Wende Neighbors Primary Cardiologist: [name]   Discharge Diagnoses    Active Problems:   Unstable angina [facility])   CAD (coronary artery disease), native coronary artery   Essential hypertension   Allergies Allergies  Allergen Reactions  . Contrast Media [Iodinated Diagnostic Agents]     Adverse reaction; pt hyperventilating, c/o CP and SOB   . Sulfonamide Derivatives Other (See Comments)    Unknown from childhood    Diagnostic Studies/Procedures    11/04/16  Conclusion     The left ventricular systolic function is normal.  LV end diastolic pressure is normal.  The left ventricular ejection fraction is 50-55% by visual estimate.  Prox LAD to Mid LAD lesion, 50 %stenosed.  Mid LAD lesion, 45 %stenosed.  Dist LAD lesion, 45 %stenosed.  Mid Cx lesion, 80 %stenosed.  Mid RCA-2 lesion, 50 %stenosed.  A STENT RESOLUTE ONYX [id number] drug eluting stent was successfully placed.  Mid RCA-1 lesion, 95 %stenosed.  Post intervention, there is a 0% residual stenosis.  Dist RCA lesion, 30 %stenosed.   Low normal global LV function with an ejection fraction of 50-55% with a mild region of mid inferior focal hypocontractility.  Multi-vessel CAD with multiple stenoses of 40-50% in the mid to mid-distal LAD; normal ramus intermediate vessel; 80% focal stenosis in the left circumflex coronary artery; and  a 95% eccentric mid RCA stenosis followed by 50% narrowing.   Successful percutaneous coronary intervention to the mid RCA with ultimate insertion of a 2.2512 mm Resolute Onyx stent postdilated to 2.5 mm with the 95 and 50% stenoses being reduced to 0%.  RECOMMENDATION: The patient will continue on dual antiplatelet therapy.  She will be hydrated overnight.  Plan for staged PCI to the left  circumflex 80% stenosis tomorrow, and medical therapy for concomitant segmental LAD stenoses.   11/05/16  Conclusion     Prox LAD to Mid LAD lesion, 50 %stenosed.  Mid LAD lesion, 45 %stenosed.  Dist LAD lesion, 45 %stenosed.  Mid RCA-1 lesion, 0 %stenosed.  A drug eluting .  Mid RCA-2 lesion, 0 %stenosed.  A drug eluting .  Dist RCA lesion, 30 %stenosed.  Prox Cx lesion, 50 %stenosed.  A STENT XIENCE ALPINE RX [id number] drug eluting stent was successfully placed.  Mid Cx lesion, 80 %stenosed.  Post intervention, there is a 0% residual stenosis.   Successful percutaneous coronary intervention to the left circumflex coronary artery with the  50 and 85% stenoses  being reduced to 0% with ultimate insertion of a 2.7523 mm Xience Alpine DES stent postdilated to 3.0 mm.  RECOMMENDATION: The patient will continue with dual antiplatelet therapy for minimum of 1 year.  Plan medical therapy for her concomitant LAD disease and high potency statin therapy for aggressive lipid intervention.   _____________   History of Present Illness     82 yo female with PMH of depression, GERD, breast CA who presented to the office with [name] after being referred by [name] for exertional dyspnea and chest discomfort. Kara Woods is a mother of a patient of Dr. Evette Georges, who had suffered an out of hospital cardiac arrest years ago.  [name] has a history of prior cardiac catheterization and from records [name] had undergone cardiac catheterization in 1998.  The patient states her coronaries were clean.  Did not have specifics of her catheterization findings.  Over the past 6 months, she has developed progressive decline in ability to be active and has noticed significant increasing exertionally precipitated shortness of breath.  She also has had some episodes of associated chest pressure with left arm radiation.  She had undergone an echo Doppler study on 10/24/2016 which showed an EF of  50% and suggested diffuse hypokinesis.  There was grade 1 diastolic dysfunction.  There was mild MR and mild LA dilatation.  She also had undergone pulmonary function testing was not significantly abnormal.  Due to her progressive symptomatology.  She is referred for cardiac catheterization.  Past history is notable for left breast CA and she is status post prior lumpectomy and radiation treatment.  She was cared for by Dr. Excell Seltzer.  She also is status post hysterectomy.  Remote history of cholecystectomy.  She has a history of retinal detachment.  Hospital Course     Consultants: None  She presented on 11/04/16 for cardiac catheterization showing mulit-vessel disease with 40-50% in the mid to distal LAD, 80% in the LCx, and 95% in mid RCA. She was treated with DESx1 to the RCA. She was started on DAPT (ASA and Brilinta) and planned for staged procedure on the LCx. She was brought back to the unit, labs were stable post procedure.    She was placed on Valium post cath for anxiety, and had some intermittent confusion overnight. Further doses were held and she returned to her baseline. Reported feeling well the following morning.   On 11/05/16 she underwent staged procedure with DESx1 placed to the LCx. Planned for medical therapy for concomitant LAD disease. She was placed on high dose statin therapy. On telemetry did have some brief bigeminy and short run of SVT, but was asymptomatic. Remained on BB therapy with metoprolol.   On 11/06/16 she was seen and assessed by Dr. Irish Lack and determined stable for discharge.   Physical Exam:     GEN: Well nourished, well developed, in no acute distress.  HEENT: Grossly normal.  Neck: Supple, no JVD, carotid bruits, or masses. Cardiac: RRR, no murmurs, rubs, or gallops. No clubbing, cyanosis, edema.  Radials/DP/PT 2+ and equal bilaterally.  Respiratory:  Respirations regular and unlabored, clear to auscultation bilaterally. GI: Soft, nontender,  nondistended, BS + x 4. MS: no deformity or atrophy. Skin: warm and dry, no rash. Right wrist/groin with mild bruising but no hematoma Neuro:  Strength and sensation are intact. Psych: AAOx3.  Normal affect. _____________  Discharge Vitals Blood pressure (!) 148/59, pulse 86, temperature 98 F (36.7 C), temperature source Oral, resp. rate (!) 22, height 5' 6.5" (1.689 m), weight 171 lb 8.3 oz (77.8 kg), SpO2 96 %.  Filed Weights   11/04/16 0552 11/05/16 0239 11/06/16 0130  Weight: 166 lb (75.3 kg) 167 lb 8.8 oz (76 kg) 171 lb 8.3 oz (77.8 kg)    Labs & Radiologic Studies    CBC  Recent Labs  11/05/16 0210 11/06/16 0220  WBC 13.1* 13.6*  HGB 12.1 12.2  HCT 37.4 36.0  MCV 93.3 91.4  PLT 224 [id number]   Basic Metabolic Panel  Recent Labs  11/05/16 0210 11/06/16 0220  NA 140 142  K 3.6 3.4*  CL 110 111  CO2 22 21*  GLUCOSE 114* 107*  BUN 8 10  CREATININE 0.69 0.74  CALCIUM 8.9 8.8*   Liver Function Tests No results for input(s): AST, ALT, ALKPHOS, BILITOT, PROT, ALBUMIN in the last 72  hours. No results for input(s): LIPASE, AMYLASE in the last 72 hours. Cardiac Enzymes No results for input(s): CKTOTAL, CKMB, CKMBINDEX, TROPONINI in the last 72 hours. BNP Invalid input(s): POCBNP D-Dimer No results for input(s): DDIMER in the last 72 hours. Hemoglobin A1C No results for input(s): HGBA1C in the last 72 hours. Fasting Lipid Panel No results for input(s): CHOL, HDL, LDLCALC, TRIG, CHOLHDL, LDLDIRECT in the last 72 hours. Thyroid Function Tests No results for input(s): TSH, T4TOTAL, T3FREE, THYROIDAB in the last 72 hours.  Invalid input(s): FREET3 _____________  No results found. Disposition   Pt is being discharged home today in good condition.  Follow-up Plans & Appointments    Follow-up Information    Barrett, Suanne Marker, PA-C Follow up on 11/11/2016.   Specialties:  Cardiology, Radiology Why:  at 10:15am for your hospital follow up appt Contact  information: [address] [place] [address] [phone]          Discharge Instructions    Amb Referral to Cardiac Rehabilitation    Complete by:  As directed    Diagnosis:  Coronary Stents   Call MD for:  redness, tenderness, or signs of infection (pain, swelling, redness, odor or green/yellow discharge around incision site)    Complete by:  As directed    Diet - low sodium heart healthy    Complete by:  As directed    Discharge instructions    Complete by:  As directed    Groin Site Care Refer to this sheet in the next few weeks. These instructions provide you with information on caring for yourself after your procedure. Your caregiver may also give you more specific instructions. Your treatment has been planned according to current medical practices, but problems sometimes occur. Call your caregiver if you have any problems or questions after your procedure. HOME CARE INSTRUCTIONS You may shower 24 hours after the procedure. Remove the bandage (dressing) and gently wash the site with plain soap and water. Gently pat the site dry.  Do not apply powder or lotion to the site.  Do not sit in a bathtub, swimming pool, or whirlpool for 5 to 7 days.  No bending, squatting, or lifting anything over 10 pounds (4.5 kg) as directed by your caregiver.  Inspect the site at least twice daily.  Do not drive home if you are discharged the same day of the procedure. Have someone else drive you.  You may drive 24 hours after the procedure unless otherwise instructed by your caregiver.  What to expect: Any bruising will usually fade within 1 to 2 weeks.  Blood that collects in the tissue (hematoma) may be painful to the touch. It should usually decrease in size and tenderness within 1 to 2 weeks.  SEEK IMMEDIATE MEDICAL CARE IF: You have unusual pain at the groin site or down the affected leg.  You have redness, warmth, swelling, or pain at the groin site.  You have drainage  (other than a small amount of blood on the dressing).  You have chills.  You have a fever or persistent symptoms for more than 72 hours.  You have a fever and your symptoms suddenly get worse.  Your leg becomes pale, cool, tingly, or numb.  You have heavy bleeding from the site. Hold pressure on the site. .   Radial Site Care Refer to this sheet in the next few weeks. These instructions provide you with information on caring for yourself after your procedure. Your caregiver may also  give you more specific instructions. Your treatment has been planned according to current medical practices, but problems sometimes occur. Call your caregiver if you have any problems or questions after your procedure. HOME CARE INSTRUCTIONS You may shower the day after the procedure.Remove the bandage (dressing) and gently wash the site with plain soap and water.Gently pat the site dry.  Do not apply powder or lotion to the site.  Do not submerge the affected site in water for 3 to 5 days.  Inspect the site at least twice daily.  Do not flex or bend the affected arm for 24 hours.  No lifting over 5 pounds (2.3 kg) for 5 days after your procedure.  Do not drive home if you are discharged the same day of the procedure. Have someone else drive you.  You may drive 24 hours after the procedure unless otherwise instructed by your caregiver.  What to expect: Any bruising will usually fade within 1 to 2 weeks.  Blood that collects in the tissue (hematoma) may be painful to the touch. It should usually decrease in size and tenderness within 1 to 2 weeks.  SEEK IMMEDIATE MEDICAL CARE IF: You have unusual pain at the radial site.  You have redness, warmth, swelling, or pain at the radial site.  You have drainage (other than a small amount of blood on the dressing).  You have chills.  You have a fever or persistent symptoms for more than 72 hours.  You have a fever and your symptoms suddenly get worse.  Your arm  becomes pale, cool, tingly, or numb.  You have heavy bleeding from the site. Hold pressure on the site.   Increase activity slowly    Complete by:  As directed       Discharge Medications   Current Discharge Medication List    START taking these medications   Details  atorvastatin (LIPITOR) 80 MG tablet Take 1 tablet (80 mg total) by mouth daily at 6 PM. Qty: 30 tablet, Refills: 6    nitroGLYCERIN (NITROSTAT) 0.4 MG SL tablet Place 1 tablet (0.4 mg total) under the tongue every 5 (five) minutes as needed. Qty: 25 tablet, Refills: 3    ticagrelor (BRILINTA) 90 MG TABS tablet Take 1 tablet (90 mg total) by mouth 2 (two) times daily. Qty: 60 tablet, Refills: 0      CONTINUE these medications which have NOT CHANGED   Details  acetaminophen (TYLENOL) 500 MG tablet Take 500-1,000 mg by mouth every 6 (six) hours as needed for mild pain.    aspirin EC 81 MG tablet Take 1 tablet (81 mg total) by mouth daily. Qty: 90 tablet, Refills: 3    carboxymethylcellulose (REFRESH PLUS) 0.5 % SOLN Place 3-4 drops into both eyes 3 (three) times daily as needed (dry eyes). Preservative free    clorazepate (TRANXENE) 15 MG tablet Take 7.5 mg by mouth 2 (two) times daily as needed for anxiety.    furosemide (LASIX) 20 MG tablet Take 20 mg by mouth daily as needed for edema.    isosorbide mononitrate (IMDUR) 30 MG 24 hr tablet Take 1 tablet (30 mg total) by mouth daily. Qty: 30 tablet, Refills: 6    metoprolol tartrate (LOPRESSOR) 25 MG tablet Take 0.5 tablets (12.5 mg total) by mouth 2 (two) times daily. Qty: 180 tablet, Refills: 3    Travoprost, BAK Free, (TRAVATAN) 0.004 % SOLN ophthalmic solution Place 1 drop into the right eye at bedtime.     VENTOLIN HFA 108 (  90 Base) MCG/ACT inhaler Inhale 1-2 puffs into the lungs every 4 (four) hours as needed for shortness of breath.         Aspirin prescribed at discharge?  Yes High Intensity Statin Prescribed? (Lipitor 40-80mg  or Crestor 20-40mg ):  Yes Beta Blocker Prescribed? Yes For EF <40%, was ACEI/ARB Prescribed? No: EF was ok ADP Receptor Inhibitor Prescribed? (i.e. Plavix etc.-Includes Medically Managed Patients): Yes For EF <40%, Aldosterone Inhibitor Prescribed? No: EF was ok Was EF assessed during THIS hospitalization? Yes Was Cardiac Rehab II ordered? (Included Medically managed Patients): Yes   Outstanding Labs/Studies   FLP and LFTs if able to tolerate statin.   Duration of Discharge Encounter   Greater than 30 minutes including physician time.  Signed, Reino Bellis NP-C 11/06/2016, 12:38 PM   I have examined the patient and reviewed assessment and plan and discussed with patient.  Agree with above as stated.  Confusion and SVT last night.  SVT asymptomatic.  Confusion may have been related to valium, age and being out of home.  No right groin hematoma.  Palpable right DP pulse. BP improved.  Discharge today.  Stressed importance of antiplatelet therapy.  Larae Grooms

## 2016-11-06 NOTE — Telephone Encounter (Signed)
Patient still in the hospital this morning.  TOC appt - 12/181/7 AT 10 30 AM w/Barrett at [facility]

## 2016-11-06 NOTE — Progress Notes (Signed)
(  Late entry)  Called to patient room approx. 1800 on 12/12 concerning patient's mental status.  Found patient disoriented and paranoid.  Suspect situational delusion; possible affects of multiple does of valium during last 24 hours, which patient is not on at home.  These symptoms were present but mild throughout the day.  Daughters have been with patient the entire day, but now concerned about new change.  Patient somewhat amenable to reorientation, and is self aware of her confusion and forgetfulness.  Report given to nite RN who will closely follow.  Advised no further valium.

## 2016-11-06 NOTE — Progress Notes (Signed)
CARDIAC REHAB PHASE I   PRE:  Rate/Rhythm: 74 SR  BP:  Sitting: 1448/59       SaO2: 97 RA  MODE:  Ambulation: 300 ft   POST:  Rate/Rhythm: 109 bigeminy, resolved quickly  BP:  Sitting: 163/66         SaO2: 100 RA  Pt ambulated 300 ft on RA, handheld assist, mostly steady gait, tolerated well.  Pt c/o mild chest tightness, quickly resolved with rest, denies any other complaints,  declined rest stop. Completed PCI/ stent education with pt and daughter at bedside.  Reviewed risk factors, PCI book, anti-platelet therapy, stent card, activity restrictions, ntg, exercise, heart healthy diet, and phase 2 cardiac rehab. Pt verbalized understanding, able to perform teach back, receptive to education. Pt agrees to phase 2 cardiac rehab referral, will send to [place] per pt request. Pt to recliner after walk, call bell within reach.   [id number] Lenna Sciara, RN, BSN 11/06/2016 9:31 AM

## 2016-11-06 NOTE — Plan of Care (Signed)
Problem: Education: Goal: Understanding of CV disease, CV risk reduction, and recovery process will improve Outcome: Completed/Met Date Met: 11/06/16 Patient with limited ability to retain information.  Daughters involved and supportive.  Teaching done with patient and daughter.

## 2016-11-07 NOTE — Telephone Encounter (Signed)
Left message to call back  

## 2016-11-08 ENCOUNTER — Telehealth: Payer: Self-pay | Admitting: Cardiovascular Disease

## 2016-11-08 NOTE — Telephone Encounter (Signed)
Patient contacted regarding discharge from [facility] on 12/14.  Patient understands to follow up with provider Rosaria Ferries on 12/18 at 10:30a at [facility]. Patient understands discharge instructions? Yes Patient understands medications and regimen? Yes Patient understands to bring all medications to this visit? Yes

## 2016-11-08 NOTE — Telephone Encounter (Signed)
Spoke to patient. She was recently discharged from hospital - had cath w stent placement, on DAPT.  New meds: ASA, Brilinta, Atorva  Notes she's had, since home, nausea, diarrhea, not eaten anything except jello and grapes - everything else causing her to run to the bathroom.  Did note she ate 1 hr ago toast w butter and cinnamon and so far has not needed to run to bathroom.  Reports hydrating well, she has seldom need for PRN lasix so I've encouraged her to continue to do so. Drinks fluids - mainly pepsi, water  Aware I will seek recommendation - ? SE from meds, contrast dye in procedure.

## 2016-11-08 NOTE — Telephone Encounter (Signed)
Also note this patient has TOC f/u on 12/18 _ I've contacted her and confirmed she will be at appt.

## 2016-11-08 NOTE — Telephone Encounter (Signed)
Atorvastatin can be associated with diarrhea (about 10%), but if it is improving I would say stay on atrovastatin. She is also likely recovering from procedure. If change is needed for statin would recommend Crestor 20mg  daily

## 2016-11-08 NOTE — Telephone Encounter (Signed)
Pt is experiencing diarrhea really bad and wondering  what she should this has been going on ever since her procedure on Monday

## 2016-11-08 NOTE — Telephone Encounter (Signed)
Pt advised on recommendations. She voiced she'll continue w current med therapy and follow up Monday w update. Aware to call back if other concerns.

## 2016-11-11 ENCOUNTER — Encounter: Payer: Self-pay | Admitting: Physician Assistant

## 2016-11-11 ENCOUNTER — Encounter (HOSPITAL_COMMUNITY): Payer: Self-pay | Admitting: Cardiology

## 2016-11-11 ENCOUNTER — Ambulatory Visit (HOSPITAL_COMMUNITY)
Admission: RE | Admit: 2016-11-11 | Discharge: 2016-11-11 | Disposition: A | Discharge status: Home / Self Care | Payer: Medicare Other | Source: Ambulatory Visit | Attending: Cardiovascular Disease | Admitting: Cardiovascular Disease

## 2016-11-11 ENCOUNTER — Ambulatory Visit (INDEPENDENT_AMBULATORY_CARE_PROVIDER_SITE_OTHER): Payer: Medicare Other | Admitting: Physician Assistant

## 2016-11-11 VITALS — BP 106/70 | HR 68 | Ht 66.0 in | Wt 167.0 lb

## 2016-11-11 DIAGNOSIS — M79604 Pain in right leg: Secondary | ICD-10-CM | POA: Insufficient documentation

## 2016-11-11 DIAGNOSIS — Z853 Personal history of malignant neoplasm of breast: Secondary | ICD-10-CM

## 2016-11-11 DIAGNOSIS — R0609 Other forms of dyspnea: Secondary | ICD-10-CM | POA: Diagnosis not present

## 2016-11-11 DIAGNOSIS — R0600 Dyspnea, unspecified: Secondary | ICD-10-CM

## 2016-11-11 DIAGNOSIS — I2510 Atherosclerotic heart disease of native coronary artery without angina pectoris: Secondary | ICD-10-CM

## 2016-11-11 NOTE — Progress Notes (Signed)
Bilateral lower extremities evaluated secondary to dyspnea, and also says she is having left knee discomfort but forgot to tell PA Venous duplex negative for DVT.

## 2016-11-11 NOTE — Progress Notes (Signed)
Cardiology Office Note   Date:  11/11/2016   ID:  Kara Woods, DOB 11/06/1933, MRN [id number]  PCP:  Wende Neighbors, MD  Cardiologist:  Dr Meredeth Ide, PA-C    History of Present Illness: Kara Woods is a 82 y.o. female with a history of GERD, depression, breast CA, dye allergy  12/11-12/13, planned admit for cath after office visit for DOE, progressive increase in DOE noted, echo w/ EF 50% and diffuse HK. Pt referred for cath.   Phone notes after admit show pt having nausea and diarrhea  Kara Woods presents for follow up.  The nausea and diarrhea have resolved w/ OTC meds.  She has had no chest pain. She has had palpitations, they have been brief and she took her Tranxene, which helped. She has not been light-headed or dizzy. She has been having a headache every day. She treats it but has it every day. She has had RLE pain below her knee. She has been getting that every day. She has also had bruising and soreness at her R groin, but that does not extend down to her knee.   She has not been driving and has not started the walking recommended by cardiac rehab. She is willing to do it and wants to do outpatient rehab as well. She has had no exertional chest pain. She has DOE, denies orthopnea or PND. She is not sure if the DOE is better or not.   Past Medical History:  Diagnosis Date  . Anxiety   . Arthritis   . Blind left eye   . Breast disorder    cancer left  . Cancer ([place])    left breast   . Colitis   . Coronary artery disease    a. 12/17 Staged PCI w DES--> RCA, DES--> LCx  . Cystocele 10/11/2013  . Depression   . GERD (gastroesophageal reflux disease)    occasional Tums only  . HOH (hard of hearing)   . Pelvic relaxation 10/11/2013  . Proctitis    colonsocopy 2007, Canasa suppositories  . S/P endoscopy March 2009   mild erosive reflux esophagitis, Schatzki's ring, s/p dilation  . Schatzki's ring   . Shortness of breath    occ if  anxiety  . Wears dentures    upper  . Wears glasses     Past Surgical History:  Procedure Laterality Date  . ANTERIOR AND POSTERIOR REPAIR N/A 05/17/2014   Procedure: ANTERIOR (CYSTOCELE) AND POSTERIOR REPAIR (RECTOCELE);  Surgeon: Reece Packer, MD;  Location: [facility];  Service: Urology;  Laterality: N/A;  . BREAST LUMPECTOMY WITH NEEDLE LOCALIZATION AND AXILLARY SENTINEL LYMPH NODE BX Left 05/03/2013   Procedure: BREAST LUMPECTOMY WITH NEEDLE LOCALIZATION AND AXILLARY SENTINEL LYMPH NODE BX;  Surgeon: Edward Jolly, MD;  Location: [facility];  Service: General;  Laterality: Left;  . BREAST SURGERY Left 05/19/13  . CARDIAC CATHETERIZATION  1998  . CARDIAC CATHETERIZATION N/A 11/04/2016   Procedure: Left Heart Cath and Coronary Angiography;  Surgeon: Troy Sine, MD;  Location: [facility];  Service: Cardiovascular;  Laterality: N/A;  . CARDIAC CATHETERIZATION N/A 11/05/2016   Procedure: Coronary Stent Intervention;  Surgeon: Troy Sine, MD;  Location: [facility];  Service: Cardiovascular;  Laterality: N/A;  . COLONOSCOPY  05/06/2006   Diffuse inflammatory changes of the rectal mucosa, consistent  with proctitis.  Otherwise, normal colon to terminal ileum  . CORONARY ANGIOPLASTY  11/04/2016  .  CORONARY STENT PLACEMENT     Drug-eluting coronary artery stent, non-bioabsorbable-polymer-coated  . CYSTOSCOPY N/A 05/17/2014   Procedure: CYSTOSCOPY;  Surgeon: Reece Packer, MD;  Location: [facility];  Service: Urology;  Laterality: N/A;  . ESOPHAGOGASTRODUODENOSCOPY  02/09/2008   Schatzki ring status post dilation/Distal esophageal erosion consistent with mild erosive reflux  esophagitis, otherwise unremarkable esophagus, normal stomach, D1, D2.  . EYE SURGERY     lt cataract-implant  . EYE SURGERY     lt-lens repaced,l  . [place] SURGERY  1993  . RE-EXCISION OF BREAST LUMPECTOMY Left 05/19/2013   Procedure: RE-EXCISION OF BREAST LUMPECTOMY;   Surgeon: Edward Jolly, MD;  Location: [facility];  Service: General;  Laterality: Left;  . RETINAL DETACHMENT SURGERY  1978   Left  . VAGINAL HYSTERECTOMY Bilateral 05/17/2014   Procedure: HYSTERECTOMY VAGINAL with Bilateral Salpingo-Oophorectomy; Bladder cystotomy repair;  Surgeon: Marvene Staff, MD;  Location: [facility];  Service: Gynecology;  Laterality: Bilateral;  . VAGINAL PROLAPSE REPAIR N/A 05/17/2014   Procedure: VAGINAL VAULT PROLAPSE AND GRAFT;  Surgeon: Reece Packer, MD;  Location: [facility];  Service: Urology;  Laterality: N/A;    Current Outpatient Prescriptions  Medication Sig Dispense Refill  . acetaminophen (TYLENOL) 500 MG tablet Take 500-1,000 mg by mouth every 6 (six) hours as needed for mild pain.    Marland Kitchen aspirin EC 81 MG tablet Take 1 tablet (81 mg total) by mouth daily. 90 tablet 3  . atorvastatin (LIPITOR) 80 MG tablet Take 1 tablet (80 mg total) by mouth daily at 6 PM. 30 tablet 6  . carboxymethylcellulose (REFRESH PLUS) 0.5 % SOLN Place 3-4 drops into both eyes 3 (three) times daily as needed (dry eyes). Preservative free    . clorazepate (TRANXENE) 15 MG tablet Take 7.5 mg by mouth 2 (two) times daily as needed for anxiety.    . furosemide (LASIX) 20 MG tablet Take 20 mg by mouth daily as needed for edema.    . isosorbide mononitrate (IMDUR) 30 MG 24 hr tablet Take 1 tablet (30 mg total) by mouth daily. 30 tablet 6  . metoprolol tartrate (LOPRESSOR) 25 MG tablet Take 0.5 tablets (12.5 mg total) by mouth 2 (two) times daily. 180 tablet 3  . nitroGLYCERIN (NITROSTAT) 0.4 MG SL tablet Place 1 tablet (0.4 mg total) under the tongue every 5 (five) minutes as needed. 25 tablet 3  . ticagrelor (BRILINTA) 90 MG TABS tablet Take 1 tablet (90 mg total) by mouth 2 (two) times daily. 60 tablet 0  . Travoprost, BAK Free, (TRAVATAN) 0.004 % SOLN ophthalmic solution Place 1 drop into the right eye at bedtime.      No current facility-administered medications  for this visit.     Allergies:   Contrast media [iodinated diagnostic agents] and Sulfonamide derivatives    Social History:  The patient  reports that she quit smoking about 26 years ago. Her smoking use included Cigarettes. She smoked 1.00 pack per day. She has never used smokeless tobacco. She reports that she drinks alcohol. She reports that she does not use drugs.   Family History:  The patient's family history includes Cancer in her brother.    ROS:  Please see the history of present illness. All other systems are reviewed and negative.   PHYSICAL EXAM: VS:  BP 106/70   Pulse 68   Ht 5\' 6"  (1.676 m)   Wt 167 lb (75.8 kg)   BMI 26.95 kg/m  , BMI  Body mass index is 26.95 kg/m. GEN: Well nourished, well developed, female in no acute distress  HEENT: normal for age  Neck: no JVD, no carotid bruit, no masses Cardiac: RRR; no murmur, no rubs, or gallops Respiratory:  clear to auscultation bilaterally, normal work of breathing GI: soft, nontender, nondistended, + BS MS: no deformity or atrophy; no edema; distal pulses are 2+ in all 4 extremities; R groin cath site with resolving ecchymosis, no bruit or hematoma. R radial cath site healing well. No tenderness or cords to R calf Skin: warm and dry, no rash Neuro:  Strength and sensation are intact Psych: euthymic mood, full affect   EKG:  EKG is ordered today. The ekg ordered today demonstrates SR, HR 68, no pathologic Q waves or ischemic changes.  CATH: 11/04/2016  The left ventricular systolic function is normal.  LV end diastolic pressure is normal.  The left ventricular ejection fraction is 50-55% by visual estimate.  Prox LAD to Mid LAD lesion, 50 %stenosed.  Mid LAD lesion, 45 %stenosed.  Dist LAD lesion, 45 %stenosed.  Mid Cx lesion, 80 %stenosed.  Mid RCA-2 lesion, 50 %stenosed.  A STENT RESOLUTE ONYX [id number] drug eluting stent was successfully placed.  Mid RCA-1 lesion, 95 %stenosed.  Post intervention,  there is a 0% residual stenosis.  Dist RCA lesion, 30 %stenosed. Low normal global LV function with an ejection fraction of 50-55% with a mild region of mid inferior focal hypocontractility. Multi-vessel CAD with multiple stenoses of 40-50% in the mid to mid-distal LAD; normal ramus intermediate vessel; 80% focal stenosis in the left circumflex coronary artery; and a 95% eccentric mid RCA stenosis followed by 50% narrowing.  Successful percutaneous coronary intervention to the mid RCA with ultimate insertion of a 2.2512 mm Resolute Onyx stent postdilated to 2.5 mm with the 95 and 50% stenoses being reduced to 0%. RECOMMENDATION: The patient will continue on dual antiplatelet therapy. She will be hydrated overnight. Plan for staged PCI to the left circumflex 80% stenosis tomorrow, and medical therapy for concomitant segmental LAD stenoses.   PCI: 11/05/2016  Prox LAD to Mid LAD lesion, 50 %stenosed.  Mid LAD lesion, 45 %stenosed.  Dist LAD lesion, 45 %stenosed.  Mid RCA-1 lesion, 0 %stenosed.  A drug eluting .  Mid RCA-2 lesion, 0 %stenosed.  A drug eluting .  Dist RCA lesion, 30 %stenosed.  Prox Cx lesion, 50 %stenosed.  A STENT XIENCE ALPINE RX [id number] drug eluting stent was successfully placed.  Mid Cx lesion, 80 %stenosed.  Post intervention, there is a 0% residual stenosis. Successful percutaneous coronary intervention to the left circumflex coronary artery with the 50 and 85% stenoses being reduced to 0% with ultimate insertion of a 2.7523 mm Xience Alpine DES stent postdilated to 3.0 mm. RECOMMENDATION: The patient will continue with dual antiplatelet therapy for minimum of 1 year. Plan medical therapy for her concomitant LAD disease and high potency statin therapy for aggressive lipid intervention.  Post-Intervention Diagram 12/12     Recent Labs: 09/01/2016: B Natriuretic Peptide 51.0 10/31/2016: ALT 10 11/06/2016: BUN 10; Creatinine, Ser 0.74;  Hemoglobin 12.2; Platelets 250; Potassium 3.4; Sodium 142    Lipid Panel    Component Value Date/Time   CHOL 227 (H) 10/31/2016 1245   TRIG 164 (H) 10/31/2016 1245   HDL 47 (L) 10/31/2016 1245   CHOLHDL 4.8 10/31/2016 1245   VLDL 33 (H) 10/31/2016 1245   LDLCALC 147 (H) 10/31/2016 1245     Wt Readings from Last 3  Encounters:  11/11/16 167 lb (75.8 kg)  11/06/16 171 lb 8.3 oz (77.8 kg)  10/31/16 169 lb 3.2 oz (76.7 kg)     Other studies Reviewed: Additional studies/ records that were reviewed today include: office notes, hospital records and testing.  ASSESSMENT AND PLAN:  1.  CAD: No angina, cath sites healing well. Ok to increase activity by rehab guidelines and we will make sure she is referred to [facility] as outpatient. As she is having daily headaches, will try decreasing the Imdur to 1/2 tablet and if she has no angina, ok to stop it. Continue ASA, Brilinta (take with Coke), high-dose Lipitor, metoprolol. No dose change in the BB, her BP is well-controlled.   2. DOE: She has been very inactive since her MI and has a hx of breast CA. Her R calf is tender, but no focal lesion noted. Will ck Dopplers for DVT. If negative, make sure she increases her activity level and attends cardiac rehab.  3. Anxiety: She takes Tranxene prn. She got Valium in the hospital and got several doses (number unclear) plus had Versed and Fentanyl twice for procedures in 72 hours. Feel her confusion was cumulative side effects of the medications, no true allergies. She should schedule the Tranxene if she is hospitalized again, to limit prn rx.   Current medicines are reviewed at length with the patient today.  The patient does not have concerns regarding medicines.  The following changes have been made:  Try decreasing Imdur and see how tolerated.   Labs/ tests ordered today include:  No orders of the defined types were placed in this encounter.    Disposition:   FU with Dr  [name]  Signed, Rosaria Ferries, PA-C  11/11/2016 10:57 AM    [facility] Phone: [phone]; Fax: [phone]  This note was written with the assistance of speech recognition software. Please excuse any transcriptional errors.

## 2016-11-11 NOTE — Patient Instructions (Signed)
Medication Instructions:  Your physician recommends that you continue on your current medications as directed. Please refer to the Current Medication list given to you today.   Labwork: Bmet, Mag today.  Please have your labs drawn @ [facility] lab on the 1st floor  Testing/Procedures: Your physician has requested that you have a lower extremity venous exercise duplex. During this test, exercise and ultrasound are used to evaluate venous blood flow in the legs. Allow one hour for this exam. There are no restrictions or special instructions.   Follow-Up: Your physician recommends that you schedule a follow-up appointment in: 3 months with Dr.Kelly  You have been referred to [facility]. They will contact you directly to schedule your orientation.    Any Other Special Instructions Will Be Listed Below (If Applicable). Keep a symptom diary. List the date, time, duration, and your heart rate      If you need a refill on your cardiac medications before your next appointment, please call your pharmacy.

## 2016-11-12 LAB — BASIC METABOLIC PANEL
BUN: 11 mg/dL (ref 7–25)
CO2: 23 mmol/L (ref 20–31)
Calcium: 9 mg/dL (ref 8.6–10.4)
Chloride: 106 mmol/L (ref 98–110)
Creat: 0.72 mg/dL (ref 0.60–0.88)
Glucose, Bld: 90 mg/dL (ref 65–99)
Potassium: 4.5 mmol/L (ref 3.5–5.3)
Sodium: 141 mmol/L (ref 135–146)

## 2016-11-12 LAB — MAGNESIUM: Magnesium: 1.9 mg/dL (ref 1.5–2.5)

## 2016-11-19 ENCOUNTER — Emergency Department (HOSPITAL_COMMUNITY): Payer: Medicare Other

## 2016-11-19 ENCOUNTER — Encounter (HOSPITAL_COMMUNITY): Payer: Self-pay

## 2016-11-19 ENCOUNTER — Emergency Department (HOSPITAL_COMMUNITY)
Admission: EM | Admit: 2016-11-19 | Discharge: 2016-11-19 | Disposition: A | Discharge status: Home / Self Care | Payer: Medicare Other | Attending: Emergency Medicine | Admitting: Emergency Medicine

## 2016-11-19 ENCOUNTER — Telehealth: Payer: Self-pay | Admitting: Cardiovascular Disease

## 2016-11-19 DIAGNOSIS — R0789 Other chest pain: Secondary | ICD-10-CM

## 2016-11-19 DIAGNOSIS — Z955 Presence of coronary angioplasty implant and graft: Secondary | ICD-10-CM | POA: Insufficient documentation

## 2016-11-19 DIAGNOSIS — R109 Unspecified abdominal pain: Secondary | ICD-10-CM | POA: Diagnosis not present

## 2016-11-19 DIAGNOSIS — Z853 Personal history of malignant neoplasm of breast: Secondary | ICD-10-CM | POA: Insufficient documentation

## 2016-11-19 DIAGNOSIS — I10 Essential (primary) hypertension: Secondary | ICD-10-CM | POA: Insufficient documentation

## 2016-11-19 DIAGNOSIS — K219 Gastro-esophageal reflux disease without esophagitis: Secondary | ICD-10-CM | POA: Diagnosis present

## 2016-11-19 DIAGNOSIS — Z87891 Personal history of nicotine dependence: Secondary | ICD-10-CM | POA: Insufficient documentation

## 2016-11-19 DIAGNOSIS — I2510 Atherosclerotic heart disease of native coronary artery without angina pectoris: Secondary | ICD-10-CM | POA: Diagnosis not present

## 2016-11-19 DIAGNOSIS — Z79899 Other long term (current) drug therapy: Secondary | ICD-10-CM | POA: Insufficient documentation

## 2016-11-19 DIAGNOSIS — R1030 Lower abdominal pain, unspecified: Secondary | ICD-10-CM | POA: Diagnosis not present

## 2016-11-19 DIAGNOSIS — R1010 Upper abdominal pain, unspecified: Secondary | ICD-10-CM | POA: Diagnosis not present

## 2016-11-19 DIAGNOSIS — Z7982 Long term (current) use of aspirin: Secondary | ICD-10-CM | POA: Diagnosis not present

## 2016-11-19 DIAGNOSIS — R42 Dizziness and giddiness: Secondary | ICD-10-CM

## 2016-11-19 DIAGNOSIS — R0602 Shortness of breath: Secondary | ICD-10-CM | POA: Diagnosis not present

## 2016-11-19 DIAGNOSIS — R079 Chest pain, unspecified: Secondary | ICD-10-CM | POA: Diagnosis not present

## 2016-11-19 LAB — BASIC METABOLIC PANEL
Anion gap: 9 (ref 5–15)
BUN: 12 mg/dL (ref 6–20)
CO2: 25 mmol/L (ref 22–32)
Calcium: 9.1 mg/dL (ref 8.9–10.3)
Chloride: 105 mmol/L (ref 101–111)
Creatinine, Ser: 0.85 mg/dL (ref 0.44–1.00)
GFR calc Af Amer: >60 mL/min (ref >60)
GFR calc non Af Amer: >60 mL/min (ref >60)
Glucose, Bld: 95 mg/dL (ref 65–99)
Potassium: 4.6 mmol/L (ref 3.5–5.1)
Sodium: 139 mmol/L (ref 135–145)

## 2016-11-19 LAB — CBC
HCT: 39 % (ref 36.0–46.0)
Hemoglobin: 12.7 g/dL (ref 12.0–15.0)
MCH: 30.5 pg (ref 26.0–34.0)
MCHC: 32.6 g/dL (ref 30.0–36.0)
MCV: 93.8 fL (ref 78.0–100.0)
Platelets: 236 10*3/uL (ref 150–400)
RBC: 4.16 MIL/uL (ref 3.87–5.11)
RDW: 14 % (ref 11.5–15.5)
WBC: 9.1 10*3/uL (ref 4.0–10.5)

## 2016-11-19 LAB — HEPATIC FUNCTION PANEL
ALT: 14 U/L (ref 14–54)
AST: 19 U/L (ref 15–41)
Albumin: 3.7 g/dL (ref 3.5–5.0)
Alkaline Phosphatase: 57 U/L (ref 38–126)
Bilirubin, Direct: 0.1 mg/dL (ref 0.1–0.5)
Indirect Bilirubin: 0.5 mg/dL (ref 0.3–0.9)
Total Bilirubin: 0.6 mg/dL (ref 0.3–1.2)
Total Protein: 6.8 g/dL (ref 6.5–8.1)

## 2016-11-19 LAB — LIPASE, BLOOD: Lipase: 28 U/L (ref 11–51)

## 2016-11-19 IMAGING — CT CT ABD-PELV W/O CM
2 of 4 series · 10 of 46 positions shown, 11 images · non-contrast
Comparison: [DATE]

CLINICAL DATA: Lower abdominal pain and tenderness, history GERD,
breast cancer, coronary disease, cystocele, contrast allergy

EXAM:
CT ABDOMEN AND PELVIS WITHOUT CONTRAST
TECHNIQUE: Multidetector CT imaging of the abdomen and pelvis was performed
following the standard protocol without IV contrast. Sagittal and
coronal MPR images reconstructed from axial data set. Patient drank
dilute oral contrast for exam.

[Series 201: routine, idose (2) · axial · 0.86mm/px · z∈[+232,+632]mm · 7 of 98 slices shown, 8 images]
[im 9/98  soft-tissue]
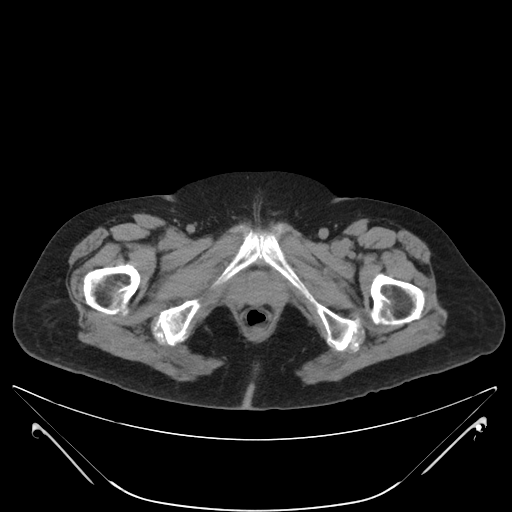
[im 9/98  bone]
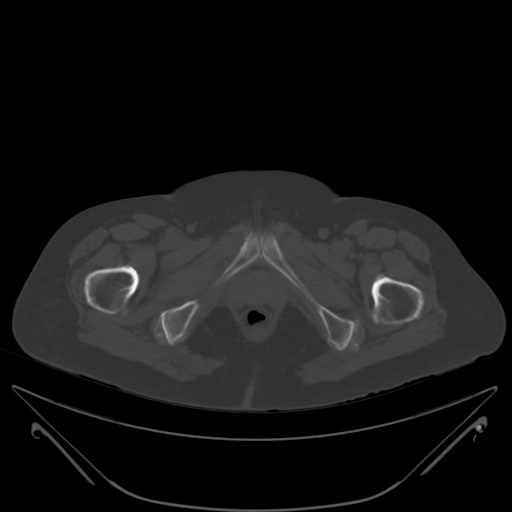
[im 22/98  soft-tissue]
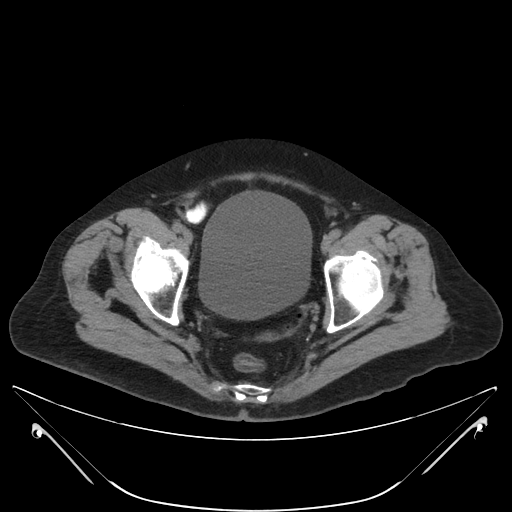
[im 34/98  soft-tissue]
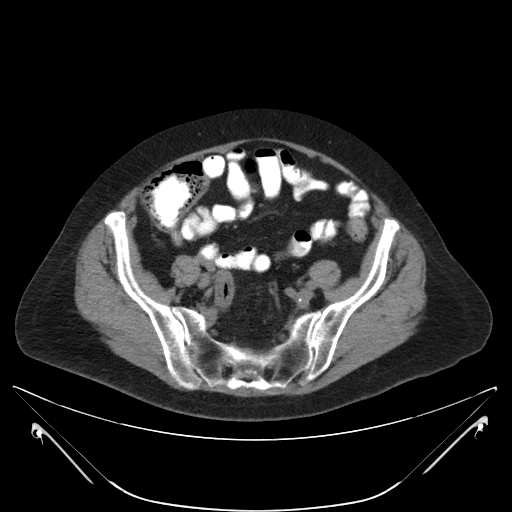
[im 51/98  soft-tissue]
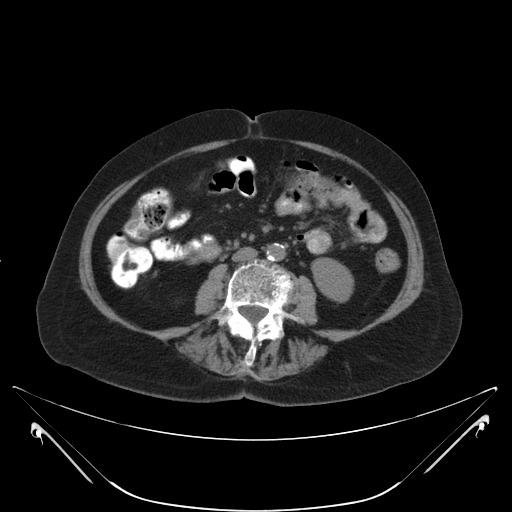
[im 64/98  soft-tissue]
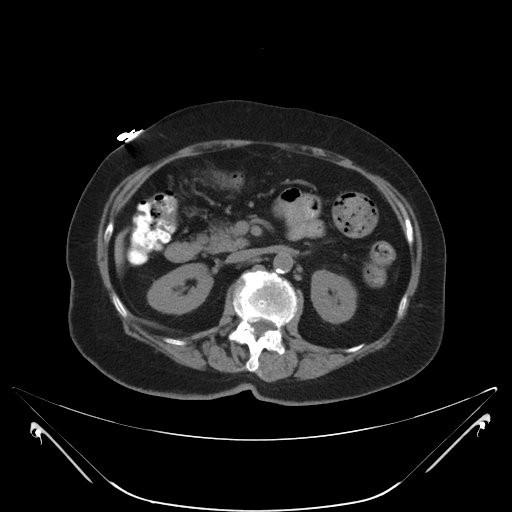
[im 76/98  soft-tissue]
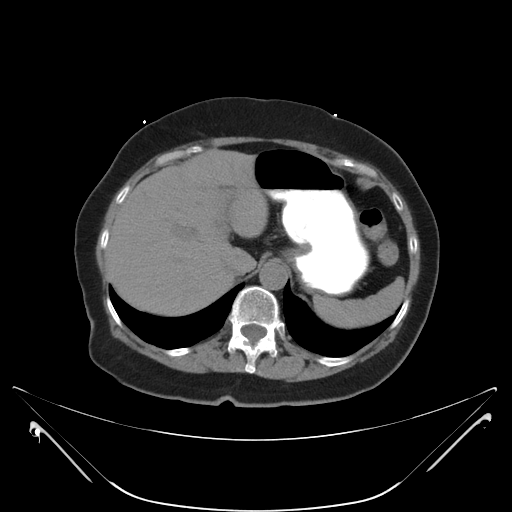
[im 89/98  soft-tissue]
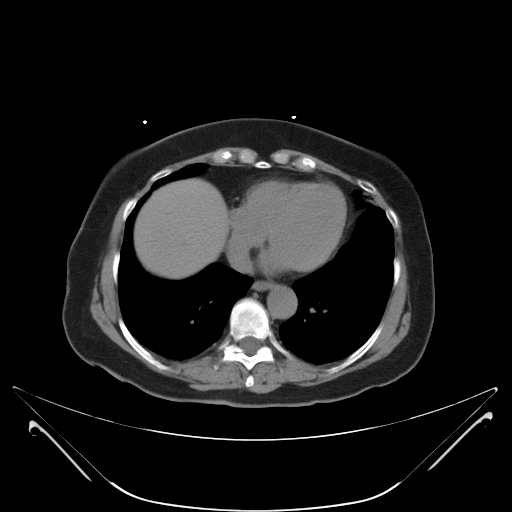

[Series 203: coronals, idose (2) · coronal · 0.45mm/px · 3 of 157 slices shown]
[im 53/157  soft-tissue]
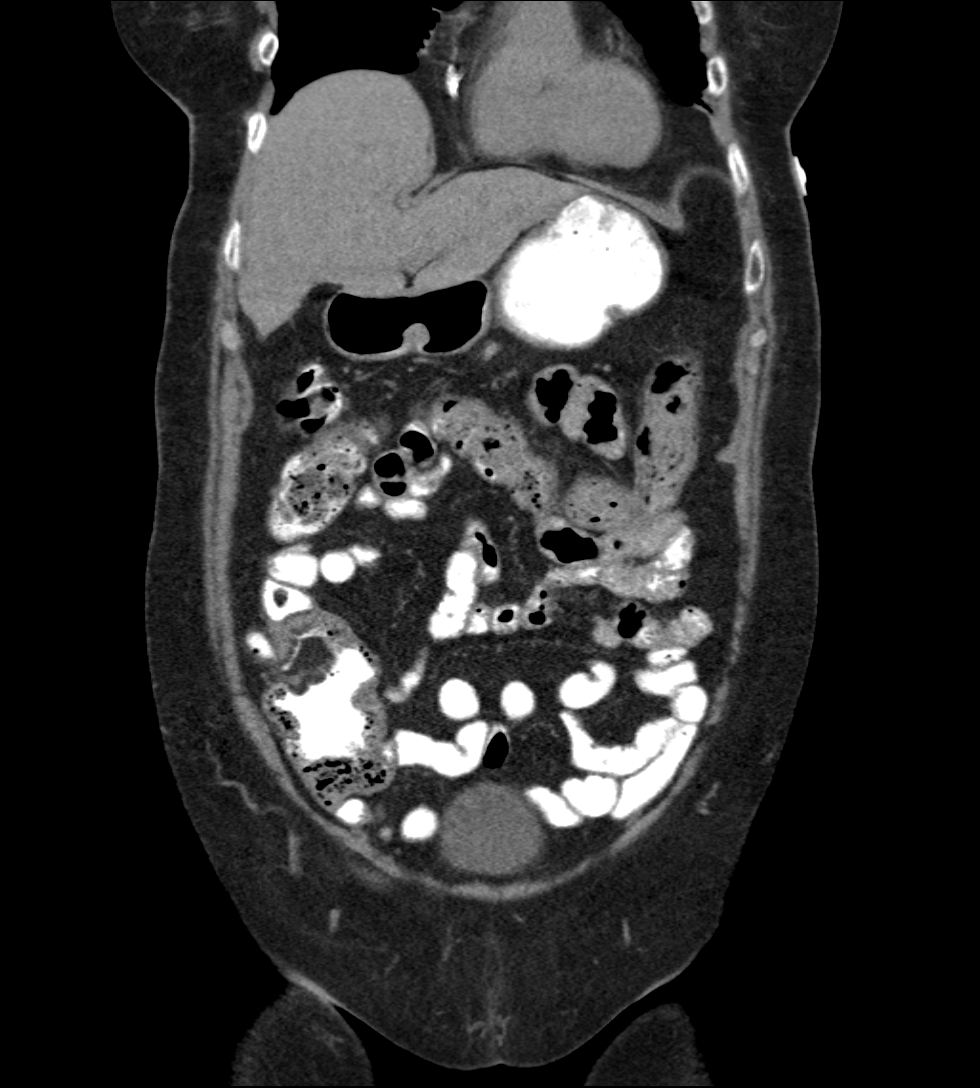
[im 70/157  soft-tissue]
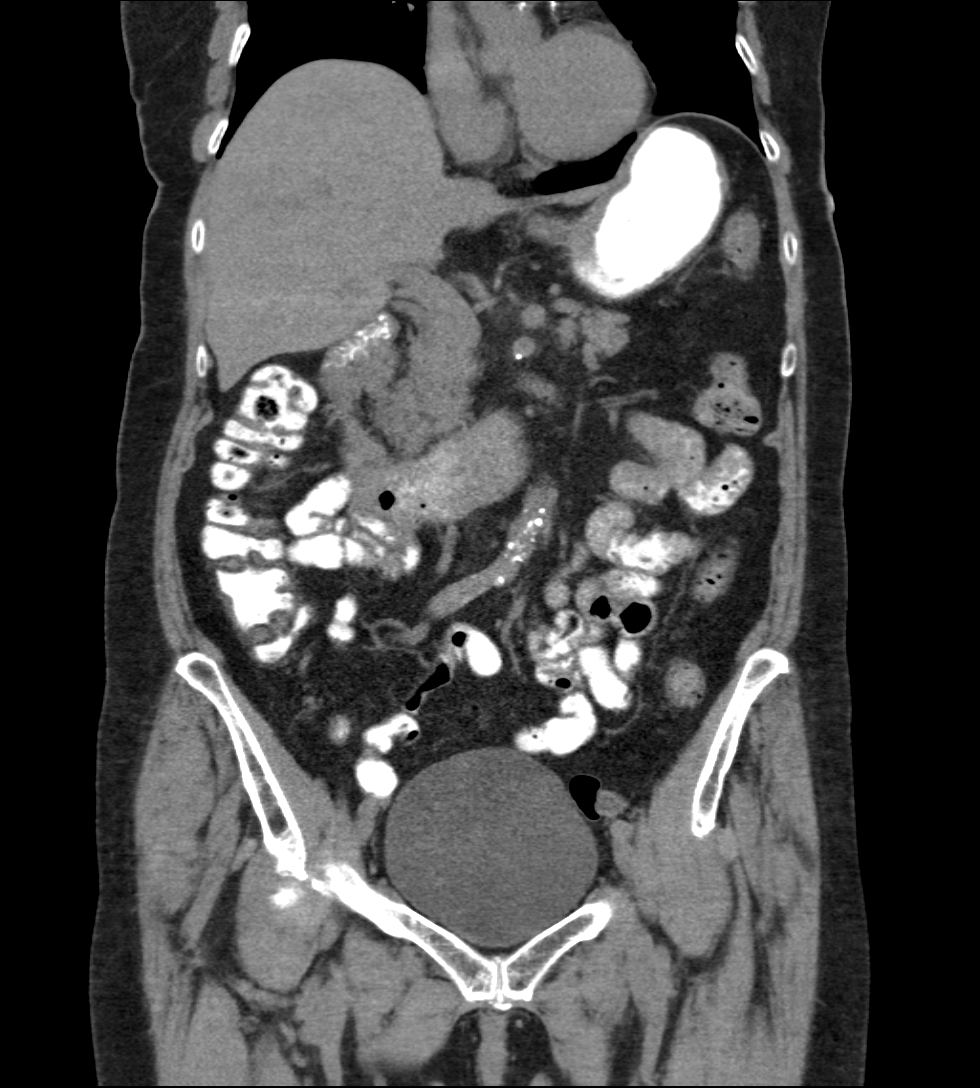
[im 87/157  soft-tissue]
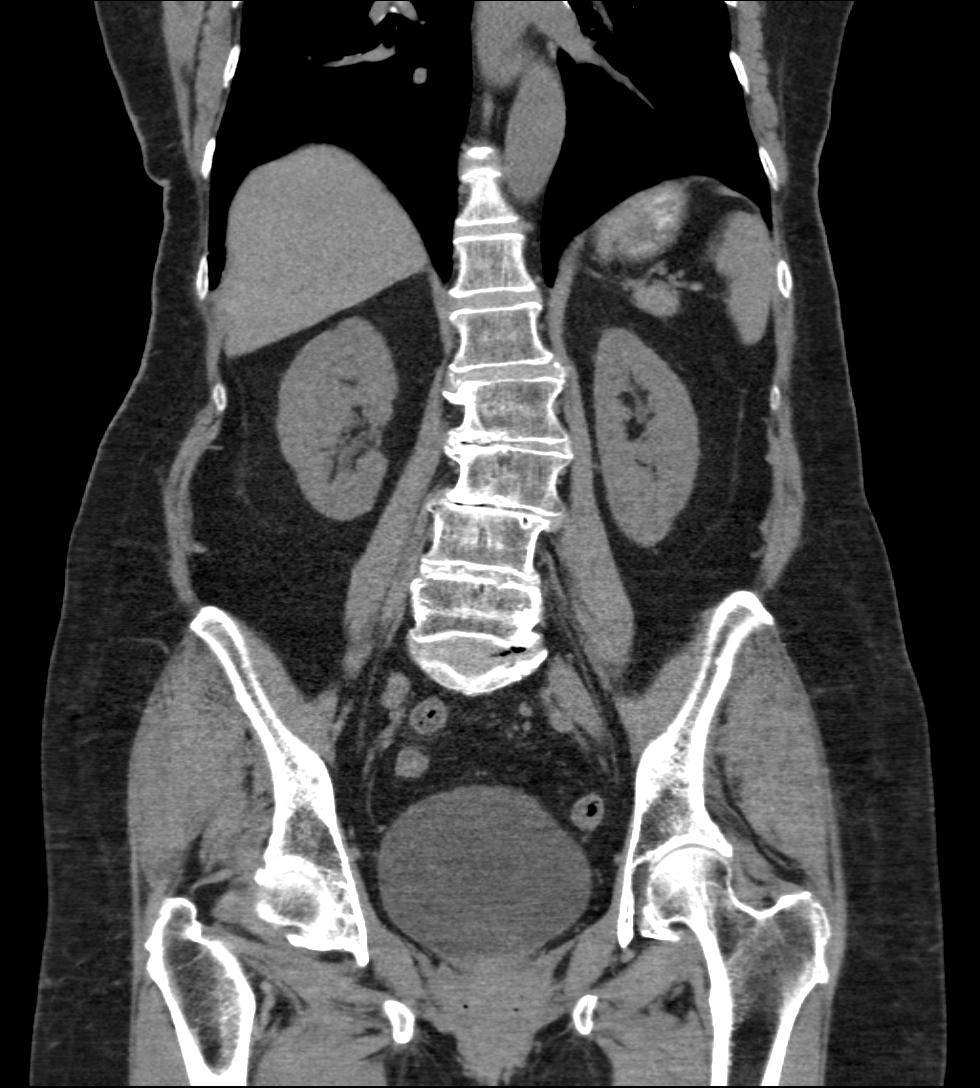

[10 of 46 positions shown; findings below may reference images not displayed]

FINDINGS: Lower chest: Minimal dependent atelectasis at lung bases. Mild
bronchiectasis RIGHT middle lobe. Gallbladder surgically absent. No
focal hepatic abnormalities.

Hepatobiliary: Normal appearance

Pancreas: Normal appearance

Spleen: Question nonspecific 8 mm low-attenuation focus versus cleft
in central spleen image 27.

Adrenals/Urinary Tract: Low descent of urinary bladder and pelvis
consistent with history of cystocele. Adrenal glands, kidneys,
ureters, and bladder otherwise normal appearance.

Stomach/Bowel: Appendix not definitely visualized but no pericecal
inflammatory process seen.

Stomach and bowel loops normal appearance.

Vascular/Lymphatic: Atherosclerotic calcifications aorta and
coronary arteries. No adenopathy.

Reproductive: Uterus surgically absent with nonvisualization of
ovaries

Other: No free air free fluid. Tiny umbilical hernia containing fat.

Musculoskeletal: Bones demineralized with degenerative changes
BILATERAL hip joints. Degenerative disc and facet disease changes
lumbar spine.
IMPRESSION: No acute intra- abdominal or intrapelvic abnormalities.

Aortic atherosclerosis and coronary arterial calcification.

Suspected cystocele.

Mild bronchiectasis RIGHT middle lobe.

Tiny umbilical hernia containing fat.

## 2016-11-19 IMAGING — DX DG CHEST 2V
2 series · 2 of 2 positions shown · non-contrast
Comparison: [DATE].

CLINICAL DATA: Shortness of breath, sharp left-sided chest pain.

EXAM:
CHEST  2 VIEW

[chest pa]
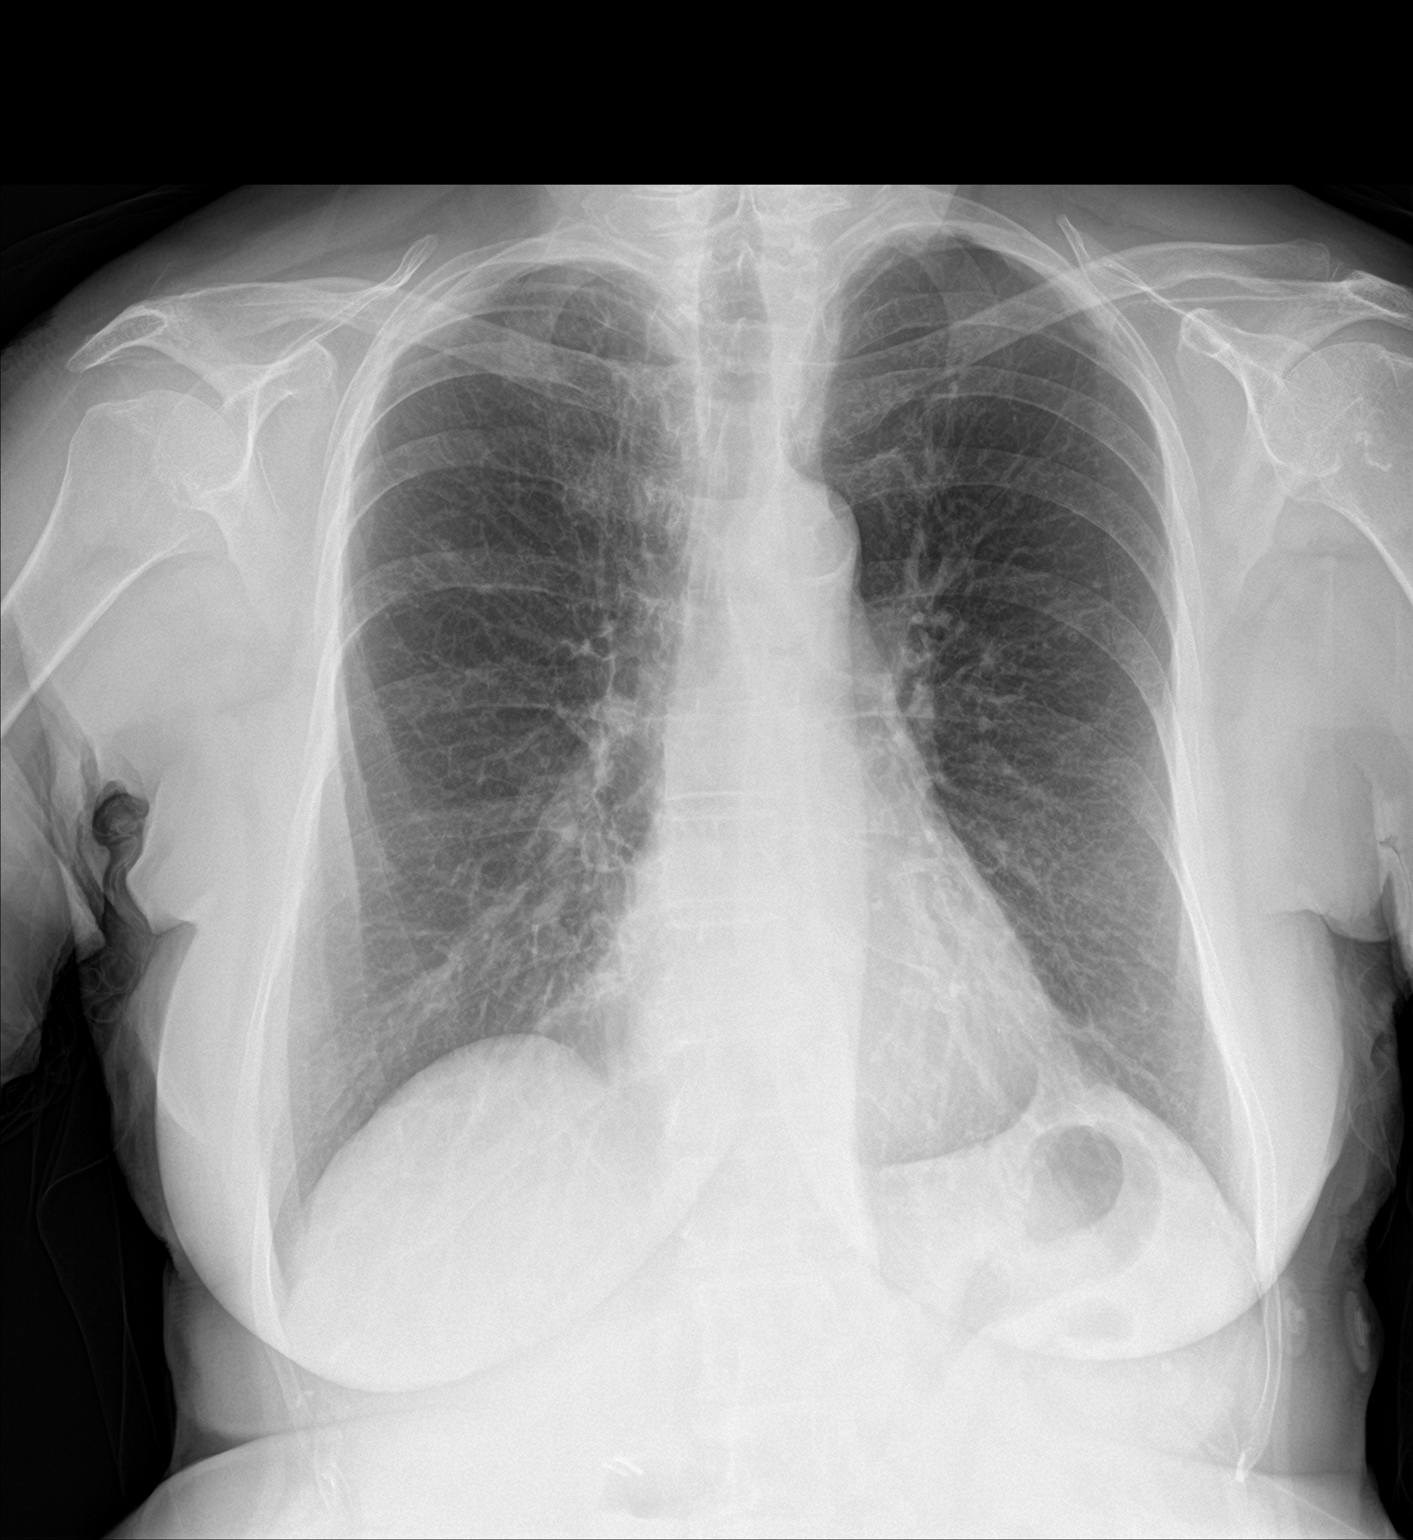

[chest lat]
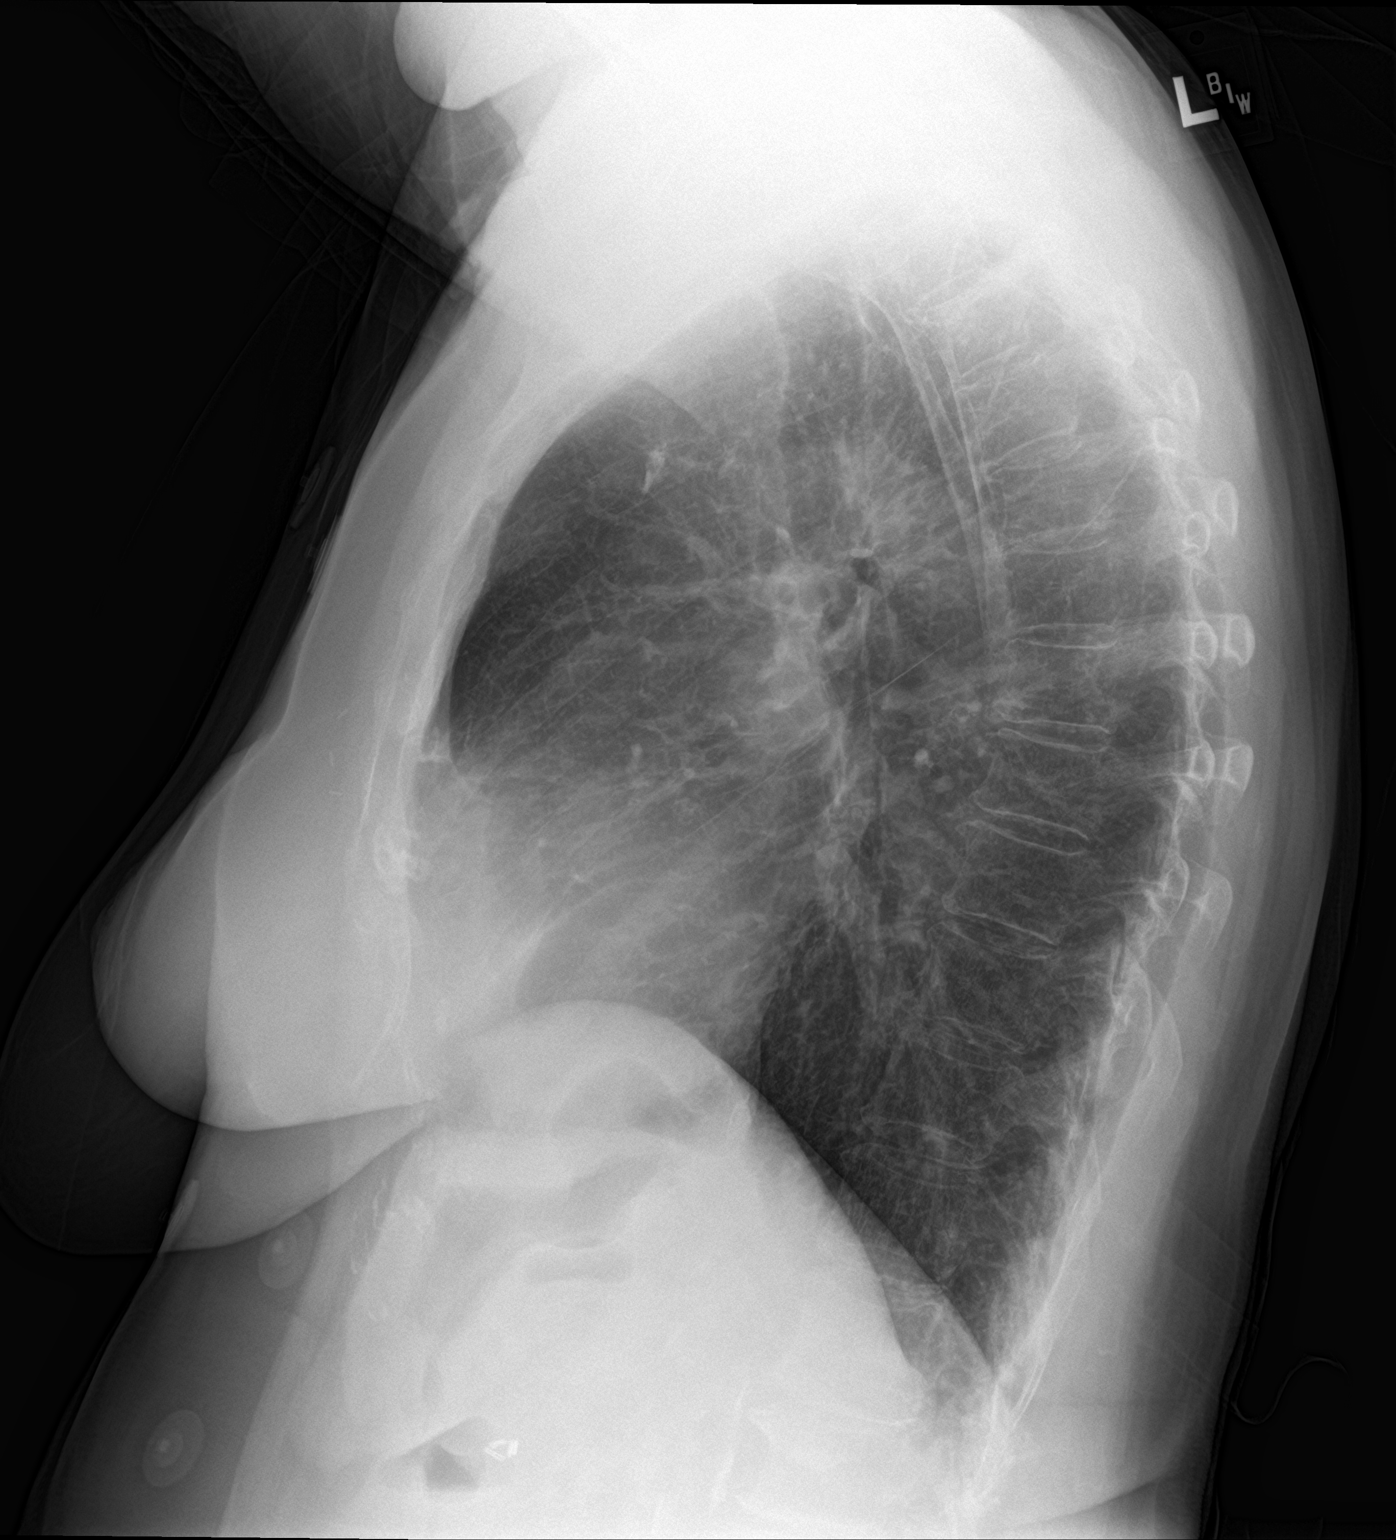

[2 of 2 positions shown; findings below may reference images not displayed]

FINDINGS: Trachea is midline. Heart size normal. Thoracic aorta is calcified.
Lungs are hyperinflated but clear. No pleural fluid.
IMPRESSION: Hyperinflation without acute finding.

## 2016-11-19 NOTE — Discharge Instructions (Signed)
Your evaluation today has been largely reassuring.  But, it is important that you monitor your condition carefully, and do not hesitate to return to the ED if you develop new, or concerning changes in your condition. ° °Otherwise, please follow-up with your physician for appropriate ongoing care. ° °

## 2016-11-19 NOTE — ED Notes (Signed)
Pt is in stable condition upon d/c and is escorted from ED via wheelchair. 

## 2016-11-19 NOTE — Telephone Encounter (Signed)
Spoke to daughter who reports patient has been having shortness of breath, left sided chest discomfort for about 2 days. Notes that she has been having "a lot of dizziness" since waking this AM & that this was new finding. Discussed w daughter, she voiced need to have patient evaluated today, I recommended w sudden onset of today's symptoms ED preferred for evaluation. She voiced understanding and will take patient to [facility] via private vehicle. Have called & LM to inform cardmaster Trish.

## 2016-11-19 NOTE — ED Provider Notes (Signed)
[facility] DEPT Provider Note   CSN: [id number] Arrival date & time: 11/19/16  1156     History   Chief Complaint Chief Complaint  Patient presents with  . Chest Pain  . Shortness of Breath    HPI Kara Woods is a 82 y.o. female .History of anxiety, coronary artery disease, and dyspnea who had coronary artery stenting by [name] on December 11. She states that she has had ongoing discomfort since then. She currently is complaining of pain between her shoulder blades. She has had some ongoing dyspnea. She denies any chest pain. She has had a poor appetite but has not lost significant weight. She denies any headache, neck pain, fever, chills, or productive cough.  HPI  Past Medical History:  Diagnosis Date  . Anxiety   . Arthritis   . Blind left eye   . Breast disorder    cancer left  . Cancer ([place])    left breast   . Colitis   . Coronary artery disease    a. 12/17 Staged PCI w DES--> RCA, DES--> LCx  . Cystocele 10/11/2013  . Depression   . GERD (gastroesophageal reflux disease)    occasional Tums only  . HOH (hard of hearing)   . Pelvic relaxation 10/11/2013  . Proctitis    colonsocopy 2007, Canasa suppositories  . S/P endoscopy March 2009   mild erosive reflux esophagitis, Schatzki's ring, s/p dilation  . Schatzki's ring   . Shortness of breath    occ if anxiety  . Wears dentures    upper  . Wears glasses     Patient Active Problem List   Diagnosis Date Noted  . CAD (coronary artery disease), native coronary artery 11/06/2016  . Essential hypertension 11/06/2016  . Unstable angina ([place]) 11/04/2016  . S/P vaginal hysterectomy 05/17/2014  . Cystocele 10/11/2013  . Pelvic relaxation 10/11/2013  . Cancer of left breast ([place]) 04/20/2013  . Diarrhea 06/23/2012  . Schatzki's ring 07/16/2011  . COLLES' FRACTURE, LEFT 04/04/2010  . CHEST PAIN UNSPECIFIED 09/18/2009  . HYPERLIPIDEMIA 07/28/2008  . ANXIETY 07/28/2008  . MACULAR DEGENERATION,  BILATERAL 07/28/2008  . CAD 07/28/2008  . GERD 07/28/2008  . PROCTITIS 07/28/2008  . OSTEOPENIA 07/28/2008  . RETINAL DETACHMENT, LEFT EYE, HX OF 07/28/2008    Past Surgical History:  Procedure Laterality Date  . ANTERIOR AND POSTERIOR REPAIR N/A 05/17/2014   Procedure: ANTERIOR (CYSTOCELE) AND POSTERIOR REPAIR (RECTOCELE);  Surgeon: Reece Packer, MD;  Location: [facility];  Service: Urology;  Laterality: N/A;  . BREAST LUMPECTOMY WITH NEEDLE LOCALIZATION AND AXILLARY SENTINEL LYMPH NODE BX Left 05/03/2013   Procedure: BREAST LUMPECTOMY WITH NEEDLE LOCALIZATION AND AXILLARY SENTINEL LYMPH NODE BX;  Surgeon: Edward Jolly, MD;  Location: [facility];  Service: General;  Laterality: Left;  . BREAST SURGERY Left 05/19/13  . CARDIAC CATHETERIZATION  1998  . CARDIAC CATHETERIZATION N/A 11/04/2016   Procedure: Left Heart Cath and Coronary Angiography;  Surgeon: Troy Sine, MD;  Location: [facility];  Service: Cardiovascular;  Laterality: N/A;  . CARDIAC CATHETERIZATION N/A 11/05/2016   Procedure: Coronary Stent Intervention;  Surgeon: Troy Sine, MD;  Location: [facility];  Service: Cardiovascular;  Laterality: N/A;  . COLONOSCOPY  05/06/2006   Diffuse inflammatory changes of the rectal mucosa, consistent  with proctitis.  Otherwise, normal colon to terminal ileum  . CORONARY ANGIOPLASTY  11/04/2016  . CORONARY STENT PLACEMENT     Drug-eluting coronary artery stent,  non-bioabsorbable-polymer-coated  . CYSTOSCOPY N/A 05/17/2014   Procedure: CYSTOSCOPY;  Surgeon: Reece Packer, MD;  Location: [facility];  Service: Urology;  Laterality: N/A;  . ESOPHAGOGASTRODUODENOSCOPY  02/09/2008   Schatzki ring status post dilation/Distal esophageal erosion consistent with mild erosive reflux  esophagitis, otherwise unremarkable esophagus, normal stomach, D1, D2.  . EYE SURGERY     lt cataract-implant  . EYE SURGERY     lt-lens repaced,l  . [place] SURGERY   1993  . RE-EXCISION OF BREAST LUMPECTOMY Left 05/19/2013   Procedure: RE-EXCISION OF BREAST LUMPECTOMY;  Surgeon: Edward Jolly, MD;  Location: [facility];  Service: General;  Laterality: Left;  . RETINAL DETACHMENT SURGERY  1978   Left  . VAGINAL HYSTERECTOMY Bilateral 05/17/2014   Procedure: HYSTERECTOMY VAGINAL with Bilateral Salpingo-Oophorectomy; Bladder cystotomy repair;  Surgeon: Marvene Staff, MD;  Location: [facility];  Service: Gynecology;  Laterality: Bilateral;  . VAGINAL PROLAPSE REPAIR N/A 05/17/2014   Procedure: VAGINAL VAULT PROLAPSE AND GRAFT;  Surgeon: Reece Packer, MD;  Location: [facility];  Service: Urology;  Laterality: N/A;    OB History    Gravida Para Term Preterm AB Living   6 6       5    SAB TAB Ectopic Multiple Live Births           6       Home Medications    Prior to Admission medications   Medication Sig Start Date End Date Taking? Authorizing Provider  acetaminophen (TYLENOL) 500 MG tablet Take 500-1,000 mg by mouth every 6 (six) hours as needed for mild pain.    Historical Provider, MD  aspirin EC 81 MG tablet Take 1 tablet (81 mg total) by mouth daily. 10/31/16   Troy Sine, MD  atorvastatin (LIPITOR) 80 MG tablet Take 1 tablet (80 mg total) by mouth daily at 6 PM. 11/06/16   Cheryln Manly, NP  carboxymethylcellulose (REFRESH PLUS) 0.5 % SOLN Place 3-4 drops into both eyes 3 (three) times daily as needed (dry eyes). Preservative free    Historical Provider, MD  clorazepate (TRANXENE) 15 MG tablet Take 7.5 mg by mouth 2 (two) times daily as needed for anxiety. 10/25/16   Historical Provider, MD  furosemide (LASIX) 20 MG tablet Take 20 mg by mouth daily as needed for edema. 10/28/16   Historical Provider, MD  isosorbide mononitrate (IMDUR) 30 MG 24 hr tablet Take 1 tablet (30 mg total) by mouth daily. 10/31/16 01/29/17  Troy Sine, MD  metoprolol tartrate (LOPRESSOR) 25 MG tablet Take 0.5 tablets (12.5 mg total) by mouth 2  (two) times daily. 10/31/16 01/29/17  Troy Sine, MD  nitroGLYCERIN (NITROSTAT) 0.4 MG SL tablet Place 1 tablet (0.4 mg total) under the tongue every 5 (five) minutes as needed. 11/06/16   Cheryln Manly, NP  ticagrelor (BRILINTA) 90 MG TABS tablet Take 1 tablet (90 mg total) by mouth 2 (two) times daily. 11/06/16   Cheryln Manly, NP  Travoprost, BAK Free, (TRAVATAN) 0.004 % SOLN ophthalmic solution Place 1 drop into the right eye at bedtime.     Historical Provider, MD    Family History Family History  Problem Relation Age of Onset  . Cancer Brother     spinal  . Colon cancer Neg Hx     Social History Social History  Substance Use Topics  . Smoking status: Former Smoker    Packs/day: 1.00    Types: Cigarettes  Quit date: 04/28/1990  . Smokeless tobacco: Never Used     Comment: quit 19 yrs ago  . Alcohol use Yes     Comment: a glass of wine once or twice a week     Allergies   Contrast media [iodinated diagnostic agents] and Sulfonamide derivatives   Review of Systems Review of Systems  All other systems reviewed and are negative.    Physical Exam Updated Vital Signs BP 107/64   Pulse 60   Temp 97.8 F (36.6 C) (Oral)   Resp 17   SpO2 98%   Physical Exam  Constitutional: She is oriented to person, place, and time. She appears well-developed and well-nourished.  HENT:  Head: Normocephalic and atraumatic.  Right Ear: External ear normal.  Left Ear: External ear normal.  Nose: Nose normal.  Mouth/Throat: Oropharynx is clear and moist.  Eyes: Conjunctivae and EOM are normal. Pupils are equal, round, and reactive to light.  Neck: Normal range of motion. Neck supple.  Cardiovascular: Normal rate and regular rhythm.   Pulmonary/Chest: Effort normal and breath sounds normal.  Abdominal: Soft. There is tenderness.    Musculoskeletal: Normal range of motion. She exhibits no edema, tenderness or deformity.  Neurological: She is alert and oriented to person,  place, and time.  Skin: Skin is warm and dry. Capillary refill takes less than 2 seconds. No erythema.  Psychiatric: She has a normal mood and affect.  Nursing note and vitals reviewed.    ED Treatments / Results  Labs (all labs ordered are listed, but only abnormal results are displayed) Labs Reviewed  BASIC METABOLIC PANEL  CBC  HEPATIC FUNCTION PANEL  LIPASE, BLOOD  I-STAT [place], ED    EKG  EKG Interpretation  Date/Time:  Tuesday November 19 2016 12:05:34 EST Ventricular Rate:  73 PR Interval:  138 QRS Duration: 82 QT Interval:  400 QTC Calculation: 440 R Axis:   61 Text Interpretation:  Normal sinus rhythm Normal ECG Confirmed by Orine Goga MD, Andee Poles [phone]) on 11/19/2016 1:24:43 PM       Radiology Dg Chest 2 View  Result Date: 11/19/2016 CLINICAL DATA:  Shortness of breath, sharp left-sided chest pain. EXAM: CHEST  2 VIEW COMPARISON:  09/26/2016. FINDINGS: Trachea is midline. Heart size normal. Thoracic aorta is calcified. Lungs are hyperinflated but clear. No pleural fluid. IMPRESSION: Hyperinflation without acute finding. Electronically Signed   By: Lorin Picket M.D.   On: 11/19/2016 12:55    Procedures Procedures (including critical care time)  Medications Ordered in ED Medications - No data to display   Initial Impression / Assessment and Plan / ED Course  I have reviewed the triage vital signs and the nursing notes.  Pertinent labs & imaging results that were available during my care of the patient were reviewed by me and considered in my medical decision making (see chart for details).  Clinical Course   82 year old female status post coronary artery stenting December 11 he presents today with ongoing dyspnea. She has recently had bilateral lower extremity Dopplers are normal. She denies any chest pain but does have some upper abdominal tenderness on exam. Labs are normal.  Discussed with Bernerd Pho, on call for chmg, and they will assess.    Patient being seen by [facility] and will disposition base on recommendations.    Patient seen and evaluated by Dr. Acie Fredrickson and Rosaria Ferries. They feel that the chest pain is non-cardiac in etiology. Given that the rest of the patient's workup is  negative, she appears stable for discharge and further evaluation as outpatient. Discussed with Dr. Vanita Panda and he will disposition patient. Final Clinical Impressions(s) / ED Diagnoses   Final diagnoses:  Abdominal pain    New Prescriptions New Prescriptions   No medications on file     Pattricia Boss, MD 11/20/16 1411

## 2016-11-19 NOTE — Telephone Encounter (Signed)
Kara Woods ( Daughter ) is calling because her mom has been hurting in her left side .  Kara Woods ius stating that she cannot get her breath good , just a little shortness of breath .

## 2016-11-19 NOTE — ED Notes (Signed)
ED Provider at bedside. 

## 2016-11-19 NOTE — ED Notes (Signed)
Chelsea from [facility] gave pt oral contrast.

## 2016-11-19 NOTE — ED Notes (Signed)
Patient transported to CT 

## 2016-11-19 NOTE — Consult Note (Signed)
CARDIOLOGY CONSULT NOTE   Patient ID: SHONTERIA MASTROGIOVANNI MRN: [id number] DOB/AGE: October 03, 1933 82 y.o.  Admit date: 11/19/2016  Primary Physician   Wende Neighbors, MD Primary Cardiologist   Dr [name] Reason for Consultation   SOB Requesting MD: Dr Jeanell Sparrow  [id number] Kara Woods is a 82 y.o. year old female with a history of GERD, depression, breast CA, dye allergy.  12/11-12/13, planned admit for cath after office visit for DOE, progressive increase in DOE noted, echo w/ EF 50% and diffuse HK. Pt referred for cath.  Pt had nausea and diarrhea after d/c, resolved w/ OTC rx. 12/18, seen in office, was taking Tranxene for palpitations, it was working. Was asked to cut back on the Imdur and increase activity. She cut back on the Imdur with no chest pain. She has increased her activity with no problem.   She has had GERD sx in the past, was occasionally taking Zantac recently, but stopped after her stents. Since the stents, she has not felt like eating. Appetite is poor. Nausea has improved, no diarrhea. She has been waking up with "goop" in the back of her throat every morning, that had improved while on the Zantac.  She developed lower L rib and side pain, worse with deep inspiration. She would have to sit up to try to breathe.   This am, she was light-headed this am. She was up later than usual last pm due to Christmas, but did not over indulge. The combination of being light-headed and the other things led to family calling the office and coming to the ER.   Currently, she is not light-headed. Abdomen is tender, mild guarding.    Past Medical History:  Diagnosis Date  . Anxiety   . Arthritis   . Blind left eye   . Breast disorder    cancer left  . Cancer ([place])    left breast   . Colitis   . Coronary artery disease    a. 12/17 Staged PCI w DES--> RCA, DES--> LCx  . Cystocele 10/11/2013  . Depression   . GERD (gastroesophageal reflux disease)    occasional Tums only  . HOH (hard  of hearing)   . Pelvic relaxation 10/11/2013  . Proctitis    colonsocopy 2007, Canasa suppositories  . S/P endoscopy March 2009   mild erosive reflux esophagitis, Schatzki's ring, s/p dilation  . Schatzki's ring   . Shortness of breath    occ if anxiety  . Wears dentures    upper  . Wears glasses      Past Surgical History:  Procedure Laterality Date  . ANTERIOR AND POSTERIOR REPAIR N/A 05/17/2014   Procedure: ANTERIOR (CYSTOCELE) AND POSTERIOR REPAIR (RECTOCELE);  Surgeon: Reece Packer, MD;  Location: [facility];  Service: Urology;  Laterality: N/A;  . BREAST LUMPECTOMY WITH NEEDLE LOCALIZATION AND AXILLARY SENTINEL LYMPH NODE BX Left 05/03/2013   Procedure: BREAST LUMPECTOMY WITH NEEDLE LOCALIZATION AND AXILLARY SENTINEL LYMPH NODE BX;  Surgeon: Edward Jolly, MD;  Location: [facility];  Service: General;  Laterality: Left;  . BREAST SURGERY Left 05/19/13  . CARDIAC CATHETERIZATION  1998  . CARDIAC CATHETERIZATION N/A 11/04/2016   Procedure: Left Heart Cath and Coronary Angiography;  Surgeon: Troy Sine, MD;  Location: [facility];  Service: Cardiovascular;  Laterality: N/A;  . CARDIAC CATHETERIZATION N/A 11/05/2016   Procedure: Coronary Stent Intervention;  Surgeon: Troy Sine, MD;  Location: [facility];  Service: Cardiovascular;  Laterality: N/A;  . COLONOSCOPY  05/06/2006   Diffuse inflammatory changes of the rectal mucosa, consistent  with proctitis.  Otherwise, normal colon to terminal ileum  . CORONARY ANGIOPLASTY  11/04/2016  . CORONARY STENT PLACEMENT     Drug-eluting coronary artery stent, non-bioabsorbable-polymer-coated  . CYSTOSCOPY N/A 05/17/2014   Procedure: CYSTOSCOPY;  Surgeon: Reece Packer, MD;  Location: [facility];  Service: Urology;  Laterality: N/A;  . ESOPHAGOGASTRODUODENOSCOPY  02/09/2008   Schatzki ring status post dilation/Distal esophageal erosion consistent with mild erosive reflux  esophagitis, otherwise  unremarkable esophagus, normal stomach, D1, D2.  . EYE SURGERY     lt cataract-implant  . EYE SURGERY     lt-lens repaced,l  . [place] SURGERY  1993  . RE-EXCISION OF BREAST LUMPECTOMY Left 05/19/2013   Procedure: RE-EXCISION OF BREAST LUMPECTOMY;  Surgeon: Edward Jolly, MD;  Location: [facility];  Service: General;  Laterality: Left;  . RETINAL DETACHMENT SURGERY  1978   Left  . VAGINAL HYSTERECTOMY Bilateral 05/17/2014   Procedure: HYSTERECTOMY VAGINAL with Bilateral Salpingo-Oophorectomy; Bladder cystotomy repair;  Surgeon: Marvene Staff, MD;  Location: [facility];  Service: Gynecology;  Laterality: Bilateral;  . VAGINAL PROLAPSE REPAIR N/A 05/17/2014   Procedure: VAGINAL VAULT PROLAPSE AND GRAFT;  Surgeon: Reece Packer, MD;  Location: [facility];  Service: Urology;  Laterality: N/A;    Allergies  Allergen Reactions  . Contrast Media [Iodinated Diagnostic Agents]     Adverse reaction; pt hyperventilating, c/o CP and SOB   . Sulfonamide Derivatives Other (See Comments)    Unknown from childhood    I have reviewed the patient's current medications Medication Sig  acetaminophen (TYLENOL) 500 MG tablet Take 500-1,000 mg by mouth every 6 (six) hours as needed for mild pain.  aspirin EC 81 MG tablet Take 1 tablet (81 mg total) by mouth daily.  atorvastatin (LIPITOR) 80 MG tablet Take 1 tablet (80 mg total) by mouth daily at 6 PM.  carboxymethylcellulose (REFRESH PLUS) 0.5 % SOLN Place 3-4 drops into both eyes 3 (three) times daily as needed (dry eyes). Preservative free  clorazepate (TRANXENE) 15 MG tablet Take 7.5 mg by mouth 2 (two) times daily as needed for anxiety.  furosemide (LASIX) 20 MG tablet Take 20 mg by mouth daily as needed for edema.  isosorbide mononitrate (IMDUR) 30 MG 24 hr tablet Take 1 tablet (30 mg total) by mouth daily.  metoprolol tartrate (LOPRESSOR) 25 MG tablet Take 0.5 tablets (12.5 mg total) by mouth 2 (two) times daily.    nitroGLYCERIN (NITROSTAT) 0.4 MG SL tablet Place 1 tablet (0.4 mg total) under the tongue every 5 (five) minutes as needed.  ticagrelor (BRILINTA) 90 MG TABS tablet Take 1 tablet (90 mg total) by mouth 2 (two) times daily.  Travoprost, BAK Free, (TRAVATAN) 0.004 % SOLN ophthalmic solution Place 1 drop into the right eye at bedtime.      Social History   Social History  . Marital status: Widowed    Spouse name: N/A  . Number of children: N/A  . Years of education: N/A   Occupational History  . Retired    Social History Main Topics  . Smoking status: Former Smoker    Packs/day: 1.00    Types: Cigarettes    Quit date: 04/28/1990  . Smokeless tobacco: Never Used     Comment: quit 19 yrs ago  . Alcohol use Yes     Comment: a glass of wine once  or twice a week  . Drug use: No  . Sexual activity: Not Currently    Birth control/ protection: Post-menopausal   Other Topics Concern  . Not on file   Social History Narrative  . No narrative on file    Family Status  Relation Status  . Brother Deceased  . Mother Deceased  . Father Deceased  . Paternal Grandfather Deceased  . Paternal Grandmother Deceased  . Maternal Grandmother Deceased  . Maternal Grandfather Deceased  . Son Alive  . Son Alive  . Son Deceased  . Daughter Alive  . Daughter Alive  . Daughter Alive  . Neg Hx    Family History  Problem Relation Age of Onset  . Cancer Brother     spinal  . Colon cancer Neg Hx      ROS:  Full 14 point review of systems complete and found to be negative unless listed above.  Physical Exam: Blood pressure 118/78, pulse 66, temperature 97.8 F (36.6 C), temperature source Oral, resp. rate 22, SpO2 99 %.  General: Well developed, well nourished, female in no acute distress Head: Eyes PERRLA, No xanthomas.   Normocephalic and atraumatic, oropharynx without edema or exudate. Dentition: fair Lungs: decreased BS bases Heart: HRRR S1 S2, no rub/gallop, no murmur. pulses are 2+  all 4 extrem.   Neck: No carotid bruits. No lymphadenopathy.  JVD not elevated Abdomen: Bowel sounds present, abdomen soft and tender middle quadrant without masses or hernias noted. No CVA tenderness Msk:  No spine or cva tenderness. No weakness, no joint deformities or effusions. Extremities: No clubbing or cyanosis. No edema.  Neuro: Alert and oriented X 3. No focal deficits noted. Psych:  Good affect, responds appropriately Skin: No rashes or lesions noted.  Labs:   Lab Results  Component Value Date   WBC 9.1 11/19/2016   HGB 12.7 11/19/2016   HCT 39.0 11/19/2016   MCV 93.8 11/19/2016   PLT 236 11/19/2016     Recent Labs Lab 11/19/16 1213 11/19/16 1325  NA 139  --   K 4.6  --   CL 105  --   CO2 25  --   BUN 12  --   CREATININE 0.85  --   CALCIUM 9.1  --   PROT  --  6.8  BILITOT  --  0.6  ALKPHOS  --  57  ALT  --  14  AST  --  19  GLUCOSE 95  --   ALBUMIN  --  3.7   Magnesium  Date Value Ref Range Status  11/11/2016 1.9 1.5 - 2.5 mg/dL Final   Lab Results  Component Value Date   CHOL 227 (H) 10/31/2016   HDL 47 (L) 10/31/2016   LDLCALC 147 (H) 10/31/2016   TRIG 164 (H) 10/31/2016   Lab Results  Component Value Date   DDIMER 0.65 (H) 09/01/2016   Lipase  Date/Time Value Ref Range Status  11/19/2016 01:25 PM 28 11 - 51 U/L Final   Echo: 10/24/2016 - Left ventricle: The cavity size was at the lower limits of   normal. Wall thickness was increased in a pattern of mild LVH.   Systolic function was normal. The estimated ejection fraction was   = 50%. Diffuse hypokinesis. Doppler parameters are consistent   with abnormal left ventricular relaxation (grade 1 diastolic   dysfunction). - Mitral valve: There was mild regurgitation. - Left atrium: The atrium was mildly dilated.  ECG:  12/26 SR, no acute changes  Cath: 12/12 Diagnostic Diagram (mRCA stent placed 12/11)   Post-Intervention Diagram      Radiology:  Ct Abdomen Pelvis Wo  Contrast Result Date: 11/19/2016 CLINICAL DATA:  Lower abdominal pain and tenderness, history GERD, breast cancer, coronary disease, cystocele, contrast allergy EXAM: CT ABDOMEN AND PELVIS WITHOUT CONTRAST TECHNIQUE: Multidetector CT imaging of the abdomen and pelvis was performed following the standard protocol without IV contrast. Sagittal and coronal MPR images reconstructed from axial data set. Patient drank dilute oral contrast for exam. COMPARISON:  05/28/2014 FINDINGS: Lower chest: Minimal dependent atelectasis at lung bases. Mild bronchiectasis RIGHT middle lobe. Gallbladder surgically absent. No focal hepatic abnormalities. Hepatobiliary: Normal appearance Pancreas: Normal appearance Spleen: Question nonspecific 8 mm low-attenuation focus versus cleft in central spleen image 27. Adrenals/Urinary Tract: Low descent of urinary bladder and pelvis consistent with history of cystocele. Adrenal glands, kidneys, ureters, and bladder otherwise normal appearance. Stomach/Bowel: Appendix not definitely visualized but no pericecal inflammatory process seen. Stomach and bowel loops normal appearance. Vascular/Lymphatic: Atherosclerotic calcifications aorta and coronary arteries. No adenopathy. Reproductive: Uterus surgically absent with nonvisualization of ovaries Other: No free air free fluid. Tiny umbilical hernia containing fat. Musculoskeletal: Bones demineralized with degenerative changes BILATERAL hip joints. Degenerative disc and facet disease changes lumbar spine. IMPRESSION: No acute intra- abdominal or intrapelvic abnormalities. Aortic atherosclerosis and coronary arterial calcification. Suspected cystocele. Mild bronchiectasis RIGHT middle lobe. Tiny umbilical hernia containing fat. Electronically Signed   By: Lavonia Dana M.D.   On: 11/19/2016 16:03   Dg Chest 2 View Result Date: 11/19/2016 CLINICAL DATA:  Shortness of breath, sharp left-sided chest pain. EXAM: CHEST  2 VIEW COMPARISON:  09/26/2016.  FINDINGS: Trachea is midline. Heart size normal. Thoracic aorta is calcified. Lungs are hyperinflated but clear. No pleural fluid. IMPRESSION: Hyperinflation without acute finding. Electronically Signed   By: Lorin Picket M.D.   On: 11/19/2016 12:55    ASSESSMENT AND PLAN:   The patient was seen today by Dr Acie Fredrickson, the patient evaluated and the data reviewed.   Principal Problem:   Dizziness Active Problems:   GERD   Signed: Lenoard Aden 11/19/2016 4:53 PM Beeper [id number]  Co-Sign MD  Attending Note:   The patient was seen and examined.  Agree with assessment and plan as noted above.  Changes made to the above note as needed.  Patient seen and independently examined with Rosaria Ferries, PA.   We discussed all aspects of the encounter. I agree with the assessment and plan as stated above.  1 .  Left flank pain   :   Very atypical for cardiac. Troponin is negative. No ecg changes. Her presenting symptoms are not at all similar to her symptoms several weeks ago when she presented with dyspnea and was found to have severe  CAD that needed stenting  I have reassured her that this is not likely cardiac CT of the abd is negative Follow up with her primary MD and [name]    I have spent a total of 40 minutes with patient reviewing hospital  notes , telemetry, EKGs, labs and examining patient as well as establishing an assessment and plan that was discussed with the patient. > 50% of time was spent in direct patient care.    Thayer Headings, Brooke Bonito., MD, [facility] 11/19/2016, 5:05 PM [address],  [place] Pager [phone]

## 2016-11-19 NOTE — ED Triage Notes (Signed)
Pt presents for evaluation of CP/SOB x 2 days with dizziness. Pt. had two stent placed 2 weeks ago. Pt states pain worse with deep breathing. Pt. Reports pain underneath bilateral breasts with radiation to back. Pt. AxO x4. [name] sent pt to ED.

## 2016-11-28 DIAGNOSIS — I10 Essential (primary) hypertension: Secondary | ICD-10-CM | POA: Diagnosis not present

## 2016-12-02 ENCOUNTER — Telehealth: Payer: Self-pay | Admitting: Cardiovascular Disease

## 2016-12-02 DIAGNOSIS — I2510 Atherosclerotic heart disease of native coronary artery without angina pectoris: Secondary | ICD-10-CM | POA: Diagnosis not present

## 2016-12-02 DIAGNOSIS — I10 Essential (primary) hypertension: Secondary | ICD-10-CM | POA: Diagnosis not present

## 2016-12-02 DIAGNOSIS — E782 Mixed hyperlipidemia: Secondary | ICD-10-CM | POA: Diagnosis not present

## 2016-12-02 DIAGNOSIS — Z0001 Encounter for general adult medical examination with abnormal findings: Secondary | ICD-10-CM | POA: Diagnosis not present

## 2016-12-02 DIAGNOSIS — F411 Generalized anxiety disorder: Secondary | ICD-10-CM | POA: Diagnosis not present

## 2016-12-02 DIAGNOSIS — M25519 Pain in unspecified shoulder: Secondary | ICD-10-CM | POA: Diagnosis not present

## 2016-12-02 NOTE — Telephone Encounter (Signed)
Please let her know that Dr Acie Fredrickson saw her in the ER and said ok to keep taking the Zantac.  The Robaxin may help several things, including her chest pain, it is a good idea.  Dr Acie Fredrickson did not feel the Imdur was absolutely necessary, ok to stop it. He is the cardiologist that saw her most recently. I agree with his assessment.

## 2016-12-02 NOTE — Telephone Encounter (Signed)
Spoke to daughter of patient concerning recent med changes. Noted at last visit mid-Dec w Suanne Marker, pt advised that she could go ahead and discontinue Imdur as she had been having recurrent headaches, even at 1/2 tablet daily dosing, and not evidence of any chest pain.  Notes she had some atypical chest pain for which she was eval'd in ED.  On f/u w PCP Dr. Juel Burrow PA, pt was advised that she should not have discontinued Imdur. She was also d/c'ed from her Zantac, recommended to start methocarbamol (Robaxin) for her symptoms.  Daughter seeking our advice. Notes with exception of ED visit 2 weeks ago, patient really hasn't had any chest pain. Recommended not starting methocarbamol until review by cardiology, advised no need to restart imdur yet.  Aware I am seeking provider review, and will return her call.

## 2016-12-02 NOTE — Telephone Encounter (Signed)
New Message     Daughter Wants Rosaria Ferries to call regarding medication changes  RX: Imdur  They stopped this medication, and she now has acid reflux and they want to go over medication for it

## 2016-12-03 NOTE — Telephone Encounter (Signed)
All advice relayed to caller. She notes they will follow recommendations as given. Aware to call if new concerns or questions.

## 2016-12-26 ENCOUNTER — Encounter (HOSPITAL_COMMUNITY): Payer: Medicare Other

## 2016-12-26 ENCOUNTER — Telehealth: Payer: Self-pay | Admitting: Cardiovascular Disease

## 2016-12-26 DIAGNOSIS — I2510 Atherosclerotic heart disease of native coronary artery without angina pectoris: Secondary | ICD-10-CM | POA: Diagnosis not present

## 2016-12-26 DIAGNOSIS — F5089 Other specified eating disorder: Secondary | ICD-10-CM | POA: Diagnosis not present

## 2016-12-26 DIAGNOSIS — K219 Gastro-esophageal reflux disease without esophagitis: Secondary | ICD-10-CM | POA: Diagnosis not present

## 2016-12-26 DIAGNOSIS — M25511 Pain in right shoulder: Secondary | ICD-10-CM | POA: Diagnosis not present

## 2016-12-26 DIAGNOSIS — R0789 Other chest pain: Secondary | ICD-10-CM | POA: Diagnosis not present

## 2016-12-26 NOTE — Telephone Encounter (Signed)
New Message  Pt voiced went to PCP today, pt has stents put in and lost 11Ibs and suggested we cut the lopressor in half.  Pt voiced she is sore waist up and she can only eat oranges.  Pt voiced she would like for a nurse to call her.  Please f/u with pt

## 2016-12-26 NOTE — Telephone Encounter (Signed)
Spoke with pt she states that she went to PCP today, pt has stents put in and lost 11Ibs and suggested we cut the Lipitor in half. Pt further states that when she eats she can only eat 2-3 bites and she fills full, she states that she has lost 11 pounds and is only eating oranges she has decreased appetite and is sore from the waist up and more in the neck and shoulders. She states that her PCP was not too concerned with all these sx so she thought that she should call us

## 2016-12-27 ENCOUNTER — Telehealth: Payer: Self-pay | Admitting: Cardiovascular Disease

## 2016-12-27 NOTE — Telephone Encounter (Signed)
New Message     Pt c/o medication issue:  1. Name of Medication: Lipitor 2. How are you currently taking this medication (dosage and times per day)? 1x at 6pm  3. Are you having a reaction (difficulty breathing--STAT)? no  4. What is your medication issue? Having severe pain in breasts, shoulders, arms, and neck

## 2016-12-27 NOTE — Telephone Encounter (Signed)
New Message    Pt called back again, she can not get voicemails

## 2016-12-27 NOTE — Telephone Encounter (Signed)
Pt of [name] Recently established cardiology patient S/p PCI w/ stent in December. On DAPT (Brilinta/ASA) and statin.  Spoke to patient. Had very thorough 21 min discussion regarding medications, general questions.  She has noted some pronounced general body aches, fatigue, overall discomfort. She also reports a very poor appetite w/e of a few foods, and a weight loss of 11 lbs.  Her PCP gave her instructions to cut the atorvastatin dose in half, but she did not want to stop medication w/o cardiology advice. I advised to go ahead and discontinue. Explained commonality of these SE's in statins and generally safer to discontinue statin than other meds. Plan to hold for 2 weeks & get advice for new dosing after that time. Incidentally, she is scheduled for a visit w Gerald Stabs on 2/15, so I advised that she may seek recommendations on med change/alternatives at that time.  Had some general anxiety regarding her medical concerns. I encouraged use of her anxiety medication if needed, which she acknowledges use of at prescribed dose. Advised that her body aches should diminish w discontinuation of the lipitor.  Also of note, she's been prescribed pantoprazole for GERD. Concerned for interaction w/ current meds. She won't take until OK'd by cardiology.  Advised her to call if she still has concerns next week/if aches/pain has not started to improve. Informed her I'd reach out to her next Tuesday or Wednesday to discuss any further advice from provider/pharmD regarding meds.

## 2016-12-27 NOTE — Telephone Encounter (Signed)
Left msg to call.

## 2016-12-30 NOTE — Telephone Encounter (Signed)
Agree with your recommendations.  Follow up on 01/09/17 and re-assess therapy during Office visit.

## 2016-12-31 ENCOUNTER — Telehealth: Payer: Self-pay | Admitting: Nurse Practitioner

## 2016-12-31 NOTE — Telephone Encounter (Signed)
Records received from Dr Wende Neighbors for apt on 01/09/17 with Angelica Ran NP. Records placed in [facility] [place] [facility] schedule for 01/09/17.CN

## 2016-12-31 NOTE — Telephone Encounter (Signed)
Pt called me back, I've advised on recommendations. She'll prefer to go onto a new med, will discuss w [name] at appt.

## 2016-12-31 NOTE — Telephone Encounter (Signed)
Would try to re-institute statin at lower dose or change to different statin. Can consider qod or q 3rd day dosing.

## 2016-12-31 NOTE — Telephone Encounter (Signed)
Left msg to call.

## 2017-01-01 NOTE — Telephone Encounter (Signed)
I discussed this with patient (see more recently initiated telephone note). This was thought to be attributed to her statin, and she received instructions to discontinue this medication for now. She will follow up with Ignacia Bayley on the 15th of this month and get further recommendations at that time. She's been instructed to call if she has new concerns.

## 2017-01-06 ENCOUNTER — Ambulatory Visit (INDEPENDENT_AMBULATORY_CARE_PROVIDER_SITE_OTHER): Payer: Medicare Other | Admitting: Otolaryngology

## 2017-01-06 DIAGNOSIS — H9012 Conductive hearing loss, unilateral, left ear, with unrestricted hearing on the contralateral side: Secondary | ICD-10-CM | POA: Diagnosis not present

## 2017-01-06 DIAGNOSIS — H6122 Impacted cerumen, left ear: Secondary | ICD-10-CM | POA: Diagnosis not present

## 2017-01-09 ENCOUNTER — Ambulatory Visit (INDEPENDENT_AMBULATORY_CARE_PROVIDER_SITE_OTHER): Payer: Medicare Other | Admitting: Nurse Practitioner

## 2017-01-09 ENCOUNTER — Encounter: Payer: Self-pay | Admitting: Nurse Practitioner

## 2017-01-09 VITALS — BP 120/68 | HR 70 | Ht 66.0 in | Wt 165.0 lb

## 2017-01-09 DIAGNOSIS — I2510 Atherosclerotic heart disease of native coronary artery without angina pectoris: Secondary | ICD-10-CM | POA: Diagnosis not present

## 2017-01-09 DIAGNOSIS — R0609 Other forms of dyspnea: Secondary | ICD-10-CM

## 2017-01-09 DIAGNOSIS — E782 Mixed hyperlipidemia: Secondary | ICD-10-CM | POA: Diagnosis not present

## 2017-01-09 MED ORDER — ROSUVASTATIN CALCIUM 5 MG PO TABS: 5.0000 mg | ORAL_TABLET | Freq: Every day | ORAL | 3 refills | Status: DC

## 2017-01-09 NOTE — Patient Instructions (Signed)
Medication Instructions:  START crestor 5mg  (1 tablet) one time daily.    Labwork: NONE  Testing/Procedures: NONE  Follow-Up: 3/21 at 9:15 AM with [name] at [facility].  Any Other Special Instructions Will Be Listed Below (If Applicable).     If you need a refill on your cardiac medications before your next appointment, please call your pharmacy.

## 2017-01-09 NOTE — Progress Notes (Signed)
Office Visit    Patient Name: Kara Woods Date of Encounter: 01/09/2017  Primary Care Provider:  Wende Neighbors, MD Primary Cardiologist:  Corky Downs, MD   Chief Complaint    83 year old female status post recent RCA and circumflex stenting with other history including hyperlipidemia, breast cancer, GERD, depression, and anxiety, who presents for follow-up.  Past Medical History    Past Medical History:  Diagnosis Date  . Anxiety   . Arthritis   . Blind left eye   . Breast disorder    cancer left  . Cancer ([place])    left breast   . Colitis   . Coronary artery disease    a. 10/2016 Staged PCI: RCA 50p, 48m (2.25x12 Resolute Onyx DES), LCX 50p, 6m (2.75x23 Xience Alpine DES). Residual LAD 50p/m, 65m/d.  Marland Kitchen Cystocele 10/11/2013  . Depression   . Diastolic dysfunction    a. 09/2016 Echo: EF 50%, diffuse hypokinesis, mild LVH, grade 1 diastolic dysfunction, mild mitral regurgitation, mildly dilated left atrium.  Marland Kitchen GERD (gastroesophageal reflux disease)    occasional Tums only  . HOH (hard of hearing)   . Pelvic relaxation 10/11/2013  . Proctitis    colonsocopy 2007, Canasa suppositories  . S/P endoscopy March 2009   mild erosive reflux esophagitis, Schatzki's ring, s/p dilation  . Schatzki's ring   . Shortness of breath    occ if anxiety  . Wears dentures    upper  . Wears glasses    Past Surgical History:  Procedure Laterality Date  . ANTERIOR AND POSTERIOR REPAIR N/A 05/17/2014   Procedure: ANTERIOR (CYSTOCELE) AND POSTERIOR REPAIR (RECTOCELE);  Surgeon: Reece Packer, MD;  Location: [facility];  Service: Urology;  Laterality: N/A;  . BREAST LUMPECTOMY WITH NEEDLE LOCALIZATION AND AXILLARY SENTINEL LYMPH NODE BX Left 05/03/2013   Procedure: BREAST LUMPECTOMY WITH NEEDLE LOCALIZATION AND AXILLARY SENTINEL LYMPH NODE BX;  Surgeon: Edward Jolly, MD;  Location: [facility];  Service: General;  Laterality: Left;  . BREAST SURGERY Left 05/19/13  .  CARDIAC CATHETERIZATION  1998  . CARDIAC CATHETERIZATION N/A 11/04/2016   Procedure: Left Heart Cath and Coronary Angiography;  Surgeon: Troy Sine, MD;  Location: [facility];  Service: Cardiovascular;  Laterality: N/A;  . CARDIAC CATHETERIZATION N/A 11/05/2016   Procedure: Coronary Stent Intervention;  Surgeon: Troy Sine, MD;  Location: [facility];  Service: Cardiovascular;  Laterality: N/A;  . COLONOSCOPY  05/06/2006   Diffuse inflammatory changes of the rectal mucosa, consistent  with proctitis.  Otherwise, normal colon to terminal ileum  . CORONARY ANGIOPLASTY  11/04/2016  . CORONARY STENT PLACEMENT     Drug-eluting coronary artery stent, non-bioabsorbable-polymer-coated  . CYSTOSCOPY N/A 05/17/2014   Procedure: CYSTOSCOPY;  Surgeon: Reece Packer, MD;  Location: [facility];  Service: Urology;  Laterality: N/A;  . ESOPHAGOGASTRODUODENOSCOPY  02/09/2008   Schatzki ring status post dilation/Distal esophageal erosion consistent with mild erosive reflux  esophagitis, otherwise unremarkable esophagus, normal stomach, D1, D2.  . EYE SURGERY     lt cataract-implant  . EYE SURGERY     lt-lens repaced,l  . [place] SURGERY  1993  . RE-EXCISION OF BREAST LUMPECTOMY Left 05/19/2013   Procedure: RE-EXCISION OF BREAST LUMPECTOMY;  Surgeon: Edward Jolly, MD;  Location: [facility];  Service: General;  Laterality: Left;  . RETINAL DETACHMENT SURGERY  1978   Left  . VAGINAL HYSTERECTOMY Bilateral 05/17/2014   Procedure: HYSTERECTOMY VAGINAL with  Bilateral Salpingo-Oophorectomy; Bladder cystotomy repair;  Surgeon: Marvene Staff, MD;  Location: [facility];  Service: Gynecology;  Laterality: Bilateral;  . VAGINAL PROLAPSE REPAIR N/A 05/17/2014   Procedure: VAGINAL VAULT PROLAPSE AND GRAFT;  Surgeon: Reece Packer, MD;  Location: [facility];  Service: Urology;  Laterality: N/A;    Allergies  Allergies  Allergen Reactions  . Contrast Media [Iodinated  Diagnostic Agents]     Adverse reaction; pt hyperventilating, c/o CP and SOB   . Lipitor [Atorvastatin] Other (See Comments)    Muscle aches, cramps, fatigue, weight loss/poor appetite  . Sulfonamide Derivatives Other (See Comments)    Unknown from childhood    History of Present Illness    83 year old female with the above complex past medical history including coronary artery disease status post admission in mid December with chest pain and exertional dyspnea. Catheterization revealed severe left circumflex and right coronary artery disease. Both were successfully treated with drug-eluting stents in a staged fashion. Other history includes breast cancer, hyperlipidemia, GERD, depression, and anxiety. Following admission in December, she was seen in the office and did complain of occasional dyspnea, which was felt to be associated with brilinta. She was seen in the emergency department in late December with lightheadedness. There are no acute findings and she was subsequently discharged home. Recently, she's been experiencing leg pains and muscle aches. She was advised to stop Lipitor and has had resolution of those symptoms.  She continues to note occasional episodes of air hunger and feeling as though she cannot get one good satisfying breath. Overall, this is tolerable but does happen most days of the week. She has not been having any significant chest pain or tightness. She does have ongoing exercise intolerance and feels as though she fatigues fairly easily. This is better then she felt prior to her stenting but not yet back to her baseline. That said, she has not been active at all and admits to sitting around most of the day. She is trying to enroll in cardiac rehabilitation at [facility] but says that she was told by staff there, that she would not be able to start until April. Her daughter plans to call tomorrow to follow-up on this.  She denies palpitations, PND, orthopnea, syncope, edema,  or early satiety.  Home Medications    Prior to Admission medications   Medication Sig Start Date End Date Taking? Authorizing Provider  acetaminophen (TYLENOL) 500 MG tablet Take 500-1,000 mg by mouth every 6 (six) hours as needed for mild pain.   Yes Historical Provider, MD  aspirin EC 81 MG tablet Take 1 tablet (81 mg total) by mouth daily. 10/31/16  Yes Troy Sine, MD  carboxymethylcellulose (REFRESH PLUS) 0.5 % SOLN Place 3-4 drops into both eyes 3 (three) times daily as needed (dry eyes). Preservative free   Yes Historical Provider, MD  clorazepate (TRANXENE) 15 MG tablet Take 7.5 mg by mouth 2 (two) times daily as needed for anxiety. 10/25/16  Yes Historical Provider, MD  metoprolol tartrate (LOPRESSOR) 25 MG tablet Take 0.5 tablets (12.5 mg total) by mouth 2 (two) times daily. 10/31/16 01/29/17 Yes Troy Sine, MD  nitroGLYCERIN (NITROSTAT) 0.4 MG SL tablet Place 1 tablet (0.4 mg total) under the tongue every 5 (five) minutes as needed. 11/06/16  Yes Cheryln Manly, NP  ticagrelor (BRILINTA) 90 MG TABS tablet Take 1 tablet (90 mg total) by mouth 2 (two) times daily. 11/06/16  Yes Cheryln Manly, NP  Travoprost, BAK Free, (  TRAVATAN) 0.004 % SOLN ophthalmic solution Place 1 drop into the right eye at bedtime.    Yes Historical Provider, MD  furosemide (LASIX) 20 MG tablet Take 20 mg by mouth daily as needed for edema. 10/28/16   Historical Provider, MD  rosuvastatin (CRESTOR) 5 MG tablet Take 1 tablet (5 mg total) by mouth daily. 01/09/17 04/09/17  Rogelia Mire, NP    Review of Systems    As above, she occasionally has brief episodes of dyspnea or the sensation that she cannot get one good breath. She has not had any significant chest pain and denies palpitations, PND, orthopnea, dizziness, syncope, edema, or early satiety.  All other systems reviewed and are otherwise negative except as noted above.  Physical Exam    VS:  BP 120/68 (BP Location: Left Arm, Patient Position:  Sitting, Cuff Size: Normal)   Pulse 70   Ht 5\' 6"  (1.676 m)   Wt 165 lb (74.8 kg)   BMI 26.63 kg/m  , BMI Body mass index is 26.63 kg/m. GEN: Well nourished, well developed, in no acute distress.  HEENT: normal.  Neck: Supple, no JVD, carotid bruits, or masses. Cardiac: RRR, no murmurs, rubs, or gallops. No clubbing, cyanosis, edema.  Radials/DP/PT 2+ and equal bilaterally.  Respiratory:  Respirations regular and unlabored, clear to auscultation bilaterally. GI: Soft, nontender, nondistended, BS + x 4. MS: no deformity or atrophy. Skin: warm and dry, no rash. Neuro:  Strength and sensation are intact. Psych: Normal affect.  Accessory Clinical Findings    Lab Results  Component Value Date   CREATININE 0.85 11/19/2016   BUN 12 11/19/2016   NA 139 11/19/2016   K 4.6 11/19/2016   CL 105 11/19/2016   CO2 25 11/19/2016   Lab Results  Component Value Date   WBC 9.1 11/19/2016   HGB 12.7 11/19/2016   HCT 39.0 11/19/2016   MCV 93.8 11/19/2016   PLT 236 11/19/2016   Lab Results  Component Value Date   CHOL 227 (H) 10/31/2016   HDL 47 (L) 10/31/2016   LDLCALC 147 (H) 10/31/2016   TRIG 164 (H) 10/31/2016   CHOLHDL 4.8 10/31/2016    Assessment & Plan    1.  Coronary artery disease: Status post drug-eluting stent placement to the LAD and right coronary artery in mid December. She has been maintained on aspirin, beta blocker, and Brilinta. She does have occasional episodes of air hunger since being placed on Brilinta, though overall, these episodes have been less frequent and have been tolerable. She is interested in continuing [place]. She was re-sling taken off of statin therapy secondary to myalgias and these have resolved. We had a long discussion today about the use of statins in the setting of coronary artery disease. Her LDL was 147 in December. She is willing to try an alternative statin but only at a low dose. I have prescribed rosuvastatin 5 mg daily. She does plan on  enrolling in cardiac rehabilitation and has been in talks with [facility]. She was recently told that she would not be able to start until April though she is not clear as to why she would have to wait that long. Her daughter is with her today and says that she will call them tomorrow. She has some degree of fatigue and says she tires sooner than she would like to with activity but overall has been sedentary. I suspect that she is deconditioned and have encouraged her to increase her activity over the course  of the next few weeks.  2. Hyperlipidemia: LDL 147. She is intolerant to Lipitor. She is willing to take a prescription for rosuvastatin 5 mg daily. She'll need follow-up lipids and LFTs in approximately 6 weeks. She has follow-up with [name] next month. If she does not tolerate low-dose Crestor, we may need to consider switching to Zetia or potentially a PCSK9 inhibitor vs enrollment in the [facility] trial.  3. Dyspnea: As above, patient has noted some degree of dyspnea throughout the day which really is described as air hunger and a sensation of not being able to get one good breath as opposed to exercise intolerance. I suspect this is secondary to Brilinta. She will try taking it with a caffeinated beverage. She does not wish to be switched to Plavix and overall feels as though his symptoms are tolerable.  4. Anxiety: This is follow-up closely by primary care. This may certainly be playing a role in some of her symptoms and she recognizes this.  5. Disposition: I spent 40 minutes with Ms. Bowring today discussing her symptoms, medications, and answering questions.  Follow-up with [name] as scheduled in March. She will need follow-up lipids and LFTs after that appointment.  Murray Hodgkins, NP 01/09/2017, 12:22 PM

## 2017-02-12 ENCOUNTER — Ambulatory Visit (INDEPENDENT_AMBULATORY_CARE_PROVIDER_SITE_OTHER): Payer: Medicare Other | Admitting: Cardiovascular Disease

## 2017-02-12 ENCOUNTER — Encounter: Payer: Self-pay | Admitting: Cardiovascular Disease

## 2017-02-12 VITALS — BP 126/60 | HR 60 | Ht 66.0 in | Wt 169.0 lb

## 2017-02-12 DIAGNOSIS — I10 Essential (primary) hypertension: Secondary | ICD-10-CM

## 2017-02-12 DIAGNOSIS — R0600 Dyspnea, unspecified: Secondary | ICD-10-CM

## 2017-02-12 DIAGNOSIS — R0609 Other forms of dyspnea: Secondary | ICD-10-CM

## 2017-02-12 DIAGNOSIS — E785 Hyperlipidemia, unspecified: Secondary | ICD-10-CM

## 2017-02-12 DIAGNOSIS — I2510 Atherosclerotic heart disease of native coronary artery without angina pectoris: Secondary | ICD-10-CM

## 2017-02-12 DIAGNOSIS — Z79899 Other long term (current) drug therapy: Secondary | ICD-10-CM

## 2017-02-12 DIAGNOSIS — Z853 Personal history of malignant neoplasm of breast: Secondary | ICD-10-CM

## 2017-02-12 MED ORDER — ROSUVASTATIN CALCIUM 10 MG PO TABS: 10.0000 mg | ORAL_TABLET | Freq: Every day | ORAL | 3 refills | Status: DC

## 2017-02-12 MED ORDER — EZETIMIBE 10 MG PO TABS: 10.0000 mg | ORAL_TABLET | Freq: Every day | ORAL | 6 refills | Status: DC

## 2017-02-12 NOTE — Progress Notes (Signed)
Cardiology Office Note    Date:  02/18/2017   ID:  Ioma, Chismar [birth year], MRN [id number]  PCP:  Wende Neighbors, MD  Cardiologist:  Shelva Majestic, MD   Chief Complaint  Patient presents with  . exertional dyspnea    follow up    History of Present Illness:  Kara Woods is a 83 y.o. female who is referred through the courtesy of Dr. Velvet Bathe for evaluation of exertional shortness of breath and chest discomfort. I initially saw her in December 2017.  She presents for four-month follow-up cardiology evaluation following her cardiac catheterization and percutaneous coronary interventions.  Kara Woods is a mother of my patient, who had suffered an out of hospital cardiac arrest years ago.  Kara Woods has a history of prior cardiac catheterization and from records [name] had undergone cardiac catheterization in 1998.  The patient states her coronaries were clean.  I do not have specifics of her catheterization findings.  Over the past 6 months, she has developed progressive decline in ability to be active and has noticed significant increasing exertionally precipitated shortness of breath.  She also has had some episodes of associated chest pressure with left arm radiation.  She had undergone an echo Doppler study on 10/24/2016 which showed an EF of 50% and suggested diffuse hypokinesis.  There was grade 1 diastolic dysfunction.  There was mild MR and mild LA dilatation.  She also had undergone pulmonary function testing was not significantly abnormal.  Her past history is notable for left breast CA and she is status post prior lumpectomy and radiation treatment.  She was cared for by Dr. Excell Seltzer.  She also is status post hysterectomy.  Remote history of cholecystectomy.  She has a history of retinal detachment.  When I saw her, due to progressive symptomatology with decline in exercise tolerance and increasing exertional dyspnea with a very strong family history of CAD.  Definitive  cardiac catheterization was recommended.  This was performed by me on 11/04/2016 and revealed low normal global LV function with an EF of 50-55% with mild mid inferior focal hypocontractility.  There was multivessel CAD with multiple stenoses of 40-50% mid to distal LAD.  She had a normal ramus intermediate vessel.  There was 80% focal stenosis in the left circumflex and 95% eccentric mid RCA stenosis followed by 50% narrowing.  She underwent initial PCI to the mid RCA with insertion of a 2.2512 mm Resolute Onyx stent postdilated to 2.5 mm with the 95% stenoses being reduced to 0%.  The following day, on 11/05/2016.  She underwent staged intervention to the left circumflex flex artery with the 50 and 85% stenoses being reduced to 0% with insertion of a 2.7523 mm  Xience Alpine DES stent postdilated to 3.0.  Owing her intervention, she has felt significantly improved.  She had mild dyspnea more attributed to air hunger.  Apparently, she was started on Lipitor but did not tolerate this.  She presents for follow-up cardiology evaluation.    Past Medical History:  Diagnosis Date  . Anxiety   . Arthritis   . Blind left eye   . Breast disorder    cancer left  . Cancer ([place])    left breast   . Colitis   . Coronary artery disease    a. 10/2016 Staged PCI: RCA 50p, 29m(2.25x12 Resolute Onyx DES), LCX 50p, 847m2.75x23 Xience Alpine DES). Residual LAD 50p/m, 453m  . CMarland Kitchenstocele 10/11/2013  . Depression   .  Diastolic dysfunction    a. 09/2016 Echo: EF 50%, diffuse hypokinesis, mild LVH, grade 1 diastolic dysfunction, mild mitral regurgitation, mildly dilated left atrium.  Marland Kitchen GERD (gastroesophageal reflux disease)    occasional Tums only  . HOH (hard of hearing)   . Pelvic relaxation 10/11/2013  . Proctitis    colonsocopy 2007, Canasa suppositories  . S/P endoscopy March 2009   mild erosive reflux esophagitis, Schatzki's ring, s/p dilation  . Schatzki's ring   . Shortness of breath    occ if  anxiety  . Wears dentures    upper  . Wears glasses     Past Surgical History:  Procedure Laterality Date  . ANTERIOR AND POSTERIOR REPAIR N/A 05/17/2014   Procedure: ANTERIOR (CYSTOCELE) AND POSTERIOR REPAIR (RECTOCELE);  Surgeon: Reece Packer, MD;  Location: [facility];  Service: Urology;  Laterality: N/A;  . BREAST LUMPECTOMY WITH NEEDLE LOCALIZATION AND AXILLARY SENTINEL LYMPH NODE BX Left 05/03/2013   Procedure: BREAST LUMPECTOMY WITH NEEDLE LOCALIZATION AND AXILLARY SENTINEL LYMPH NODE BX;  Surgeon: Edward Jolly, MD;  Location: [facility];  Service: General;  Laterality: Left;  . BREAST SURGERY Left 05/19/13  . CARDIAC CATHETERIZATION  1998  . CARDIAC CATHETERIZATION N/A 11/04/2016   Procedure: Left Heart Cath and Coronary Angiography;  Surgeon: Troy Sine, MD;  Location: [facility];  Service: Cardiovascular;  Laterality: N/A;  . CARDIAC CATHETERIZATION N/A 11/05/2016   Procedure: Coronary Stent Intervention;  Surgeon: Troy Sine, MD;  Location: [facility];  Service: Cardiovascular;  Laterality: N/A;  . COLONOSCOPY  05/06/2006   Diffuse inflammatory changes of the rectal mucosa, consistent  with proctitis.  Otherwise, normal colon to terminal ileum  . CORONARY ANGIOPLASTY  11/04/2016  . CORONARY STENT PLACEMENT     Drug-eluting coronary artery stent, non-bioabsorbable-polymer-coated  . CYSTOSCOPY N/A 05/17/2014   Procedure: CYSTOSCOPY;  Surgeon: Reece Packer, MD;  Location: [facility];  Service: Urology;  Laterality: N/A;  . ESOPHAGOGASTRODUODENOSCOPY  02/09/2008   Schatzki ring status post dilation/Distal esophageal erosion consistent with mild erosive reflux  esophagitis, otherwise unremarkable esophagus, normal stomach, D1, D2.  . EYE SURGERY     lt cataract-implant  . EYE SURGERY     lt-lens repaced,l  . [place] SURGERY  1993  . RE-EXCISION OF BREAST LUMPECTOMY Left 05/19/2013   Procedure: RE-EXCISION OF BREAST LUMPECTOMY;   Surgeon: Edward Jolly, MD;  Location: [facility];  Service: General;  Laterality: Left;  . RETINAL DETACHMENT SURGERY  1978   Left  . VAGINAL HYSTERECTOMY Bilateral 05/17/2014   Procedure: HYSTERECTOMY VAGINAL with Bilateral Salpingo-Oophorectomy; Bladder cystotomy repair;  Surgeon: Marvene Staff, MD;  Location: [facility];  Service: Gynecology;  Laterality: Bilateral;  . VAGINAL PROLAPSE REPAIR N/A 05/17/2014   Procedure: VAGINAL VAULT PROLAPSE AND GRAFT;  Surgeon: Reece Packer, MD;  Location: [facility];  Service: Urology;  Laterality: N/A;    Current Medications: Outpatient Medications Prior to Visit  Medication Sig Dispense Refill  . acetaminophen (TYLENOL) 500 MG tablet Take 500-1,000 mg by mouth every 6 (six) hours as needed for mild pain.    Marland Kitchen aspirin EC 81 MG tablet Take 1 tablet (81 mg total) by mouth daily. 90 tablet 3  . carboxymethylcellulose (REFRESH PLUS) 0.5 % SOLN Place 3-4 drops into both eyes 3 (three) times daily as needed (dry eyes). Preservative free    . clorazepate (TRANXENE) 15 MG tablet Take 7.5 mg by mouth 2 (two)  times daily as needed for anxiety.    . nitroGLYCERIN (NITROSTAT) 0.4 MG SL tablet Place 1 tablet (0.4 mg total) under the tongue every 5 (five) minutes as needed. 25 tablet 3  . ticagrelor (BRILINTA) 90 MG TABS tablet Take 1 tablet (90 mg total) by mouth 2 (two) times daily. 60 tablet 0  . Travoprost, BAK Free, (TRAVATAN) 0.004 % SOLN ophthalmic solution Place 1 drop into the right eye at bedtime.     . metoprolol tartrate (LOPRESSOR) 25 MG tablet Take 0.5 tablets (12.5 mg total) by mouth 2 (two) times daily. 180 tablet 3  . furosemide (LASIX) 20 MG tablet Take 20 mg by mouth daily as needed for edema.    . rosuvastatin (CRESTOR) 5 MG tablet Take 1 tablet (5 mg total) by mouth daily. 90 tablet 3   No facility-administered medications prior to visit.      Allergies:   Contrast media [iodinated diagnostic agents]; Lipitor  [atorvastatin]; and Sulfonamide derivatives   Social History   Social History  . Marital status: Widowed    Spouse name: N/A  . Number of children: N/A  . Years of education: N/A   Occupational History  . Retired    Social History Main Topics  . Smoking status: Former Smoker    Packs/day: 1.00    Types: Cigarettes    Quit date: 04/28/1990  . Smokeless tobacco: Never Used     Comment: quit 19 yrs ago  . Alcohol use Yes     Comment: a glass of wine once or twice a week  . Drug use: No  . Sexual activity: Not Currently    Birth control/ protection: Post-menopausal   Other Topics Concern  . None   Social History Narrative  . None     Family History:  The patient's family history includes Cancer in her brother.  I mother died and had a myocardial infarction.  Her father also had suffered a myocardial infarction.  She had 3 sisters who also had myocardial infarction and also one of her brothers also had suffered a myocardial infarction.  ROS General: Negative; No fevers, chills, or night sweats;  HEENT: History of retinal detachment, sinus congestion, difficulty swallowing Pulmonary: Negative; No cough, wheezing, shortness of breath, hemoptysis Cardiovascular: see HPI GI: Negative; No nausea, vomiting, diarrhea, or abdominal pain GU: Negative; No dysuria, hematuria, or difficulty voiding Musculoskeletal: Negative; no myalgias, joint pain, or weakness Hematologic/Oncology: Negative; no easy bruising, bleeding Endocrine: Negative; no heat/cold intolerance; no diabetes Neuro: Negative; no changes in balance, headaches Skin: Negative; No rashes or skin lesions Psychiatric: Negative; No behavioral problems, depression Sleep: Negative; No snoring, daytime sleepiness, hypersomnolence, bruxism, restless legs, hypnogognic hallucinations, no cataplexy Other comprehensive 14 point system review is negative.   PHYSICAL EXAM:   VS:  BP 126/60   Pulse 60   Ht _0  (1.676 m)   Wt  169 lb (76.7 kg)   BMI 27.28 kg/m    Wt Readings from Last 3 Encounters:  02/12/17 169 lb (76.7 kg)  01/09/17 165 lb (74.8 kg)  11/11/16 167 lb (75.8 kg)    General: Alert, oriented, no distress.  Skin: normal turgor, no rashes, warm and dry HEENT: Normocephalic, atraumatic. Pupils equal round and reactive to light; sclera anicteric; extraocular muscles intact; Fundi Disks flat.  No hemorrhages or exudates. Nose without nasal septal hypertrophy Mouth/Parynx benign; Mallinpatti scale 3 Neck: No JVD, no carotid bruits; normal carotid upstroke Lungs: clear to ausculatation and percussion; no wheezing or  rales Chest wall: without tenderness to palpitation Heart: PMI not displaced, RRR, s1 s2 normal, 1/6 systolic murmur, no diastolic murmur, no rubs, gallops, thrills, or heaves Abdomen: soft, nontender; no hepatosplenomehaly, BS+; abdominal aorta nontender and not dilated by palpation. Back: no CVA tenderness Pulses 2+ Musculoskeletal: full range of motion, normal strength, no joint deformities Extremities: no clubbing cyanosis or edema, Homan's sign negative  Neurologic: grossly nonfocal; Cranial nerves grossly wnl Psychologic: Normal mood and affect   Studies/Labs Reviewed:   ECG (independently read by me): Sinus bradycardia 54 bpm.  Normal intervals.  No ST segment changes.  December 2017 ECG (independently read by me): Normal sinus rhythm at 74 bpm.  No ectopy.  QTc interval 455 ms.  Recent Labs: BMP Latest Ref Rng & Units 11/19/2016 11/11/2016 11/06/2016  Glucose 65 - 99 mg/dL 95 90 107(H)  BUN 6 - 20 mg/dL _0 Creatinine 0.44 - 1.00 mg/dL 0.85 0.72 0.74  Sodium 135 - 145 mmol/L 139 141 142  Potassium 3.5 - 5.1 mmol/L 4.6 4.5 3.4(L)  Chloride 101 - 111 mmol/L 105 106 111  CO2 22 - 32 mmol/L 25 23 21(L)  Calcium 8.9 - 10.3 mg/dL 9.1 9.0 8.8(L)     Hepatic Function Latest Ref Rng & Units 11/19/2016 10/31/2016 05/28/2014  Total Protein 6.5 - 8.1 g/dL 6.8 7.1 7.7    Albumin 3.5 - 5.0 g/dL 3.7 4.3 3.4(L)  AST 15 - 41 U/L _1 ALT 14 - 54 U/L _2 Alk Phosphatase 38 - 126 U/L 57 55 75  Total Bilirubin 0.3 - 1.2 mg/dL 0.6 0.4 0.3  Bilirubin, Direct 0.1 - 0.5 mg/dL 0.1 - -    CBC Latest Ref Rng & Units 11/19/2016 11/06/2016 11/05/2016  WBC 4.0 - 10.5 K/uL 9.1 13.6(H) 13.1(H)  Hemoglobin 12.0 - 15.0 g/dL 12.7 12.2 12.1  Hematocrit 36.0 - 46.0 % 39.0 36.0 37.4  Platelets 150 - 400 K/uL 236 250 224   Lab Results  Component Value Date   MCV 93.8 11/19/2016   MCV 91.4 11/06/2016   MCV 93.3 11/05/2016   No results found for: TSH No results found for: HGBA1C   BNP    Component Value Date/Time   BNP 51.0 09/01/2016 1012    ProBNP No results found for: PROBNP   Lipid Panel     Component Value Date/Time   CHOL 227 (H) 10/31/2016 1245   TRIG 164 (H) 10/31/2016 1245   HDL 47 (L) 10/31/2016 1245   CHOLHDL 4.8 10/31/2016 1245   VLDL 33 (H) 10/31/2016 1245   LDLCALC 147 (H) 10/31/2016 1245     RADIOLOGY: No results found.   Additional studies/ records that were reviewed today include:  I reviewed the office records of [name].  I reviewed her echo Doppler study and pulmonary function tests.    ASSESSMENT:    1. Coronary artery disease involving native coronary artery of native heart without angina pectoris   2. Essential hypertension   3. Hyperlipidemia with target LDL less than 70   4. Medication management   5. Exertional dyspnea   6. History of breast cancer      PLAN:  Kara Woods is an 83 year old Caucasian female who has developed progressive decline in exercise tolerance with significant increasing shortness of breath with minimal activity associated with chest pressure and left arm radiation.  In 1998, reportedly a cardiac catheterization had shown normal coronary arteries.  Her recent echo Doppler study from  November 2017 showed an EF of 50% with diffuse hypokinesis.  There was mild MR and mild  dilatation of her left atrium. Her pulmonary function studies did not identified significant pulmonary disability in the etiology of her dyspnea.  Her very strong family history of myocardial infarctions in multiple family members, as well as remote tobacco history and progressive changes in symptomatology.  Cardiac catheterization revealed severe multivessel CAD as noted above and she underwent successful stenting of her RCA as well as her left circumflex vessels and a stage procedure.  Subsequent, she has been without chest pain.  Her exertional capacity has increased.  She did have significant hyperlipidemia and was started on tour but unfortunate, he stopped this secondary to some myalgias.  I have recommended reinstitution of low-dose statin and will institute Crestor 5 mg 2 times a week for the first 3 weeks and if she tolerates.  She will increase this to every other day.  I will also add Zetia 10 mg to achieve an additional 2025% LDL reduction.  When compared to her low-dose statin alone.  I have recommended that she start cardiac rehabilitation.  Her blood pressure today is stable.  She is tolerating aspirin and Brilinta and is not having bleeding.  She is unaware of palpitations.  In 2 months.  She will undergo repeat laboratory and I will see her in 3 months for reevaluation.   Medication Adjustments/Labs and Tests Ordered: Current medicines are reviewed at length with the patient today.  Concerns regarding medicines are outlined above.  Medication changes, Labs and Tests ordered today are listed in the Patient Instructions below.\  Patient Instructions  Medication Instructions:   Start new prescriptions for Zetia ( ezetimibe) and Crestor ( Rosuvastatin).   start  The Rosuvastatin ( crestor) @ 1/2 tablet twice a week for 3 weeks then 1/2 tablet every other day  Labwork:  2 months. Lab slips provided today.  Testing/Procedures:  none  Follow-Up:  3 months with Dr [name].  Any Other  Special Instructions Will Be Listed Below (If Applicable).      Signed, Shelva Majestic, MD, [facility] 02/18/2017 4:28 PM    [facility] [address] [place], [place], [place]  [address] Phone: [phone]

## 2017-02-12 NOTE — Patient Instructions (Signed)
Medication Instructions:   Start new prescriptions for Zetia ( ezetimibe) and Crestor ( Rosuvastatin).   start  The Rosuvastatin ( crestor) @ 1/2 tablet twice a week for 3 weeks then 1/2 tablet every other day  Labwork:  2 months. Lab slips provided today.  Testing/Procedures:  none  Follow-Up:  3 months with Dr [name].  Any Other Special Instructions Will Be Listed Below (If Applicable).

## 2017-02-13 ENCOUNTER — Telehealth: Payer: Self-pay | Admitting: Cardiovascular Disease

## 2017-02-13 MED ORDER — ROSUVASTATIN CALCIUM 5 MG PO TABS: ORAL_TABLET | ORAL | 3 refills | Status: DC

## 2017-02-13 NOTE — Telephone Encounter (Signed)
Please call,she needs to talk to you about pt's directions for her Crestor.

## 2017-02-13 NOTE — Telephone Encounter (Signed)
Returned call to daughter (ok per DPR)-states she is confused about crestor instructions.    Per chart review: patient saw [name] yesterday:  "Start new prescriptions for Zetia ( ezetimibe) and Crestor ( Rosuvastatin).  start The Rosuvastatin ( crestor) @ 1/2 tablet twice a week for 3 weeks then 1/2 tablet every other day"   Daughter states they are unable to cut 10mg  tablets-requesting 5mg  tablets be called in instead.    New rx sent to pharmacy.  Daughter aware and verbalized understanding.

## 2017-03-06 ENCOUNTER — Encounter (HOSPITAL_COMMUNITY): Admission: RE | Admit: 2017-03-06 | Payer: Medicare Other | Source: Ambulatory Visit

## 2017-03-10 ENCOUNTER — Telehealth: Payer: Self-pay | Admitting: Cardiovascular Disease

## 2017-03-10 MED ORDER — ROSUVASTATIN CALCIUM 5 MG PO TABS: ORAL_TABLET | ORAL | 3 refills | Status: DC

## 2017-03-10 NOTE — Telephone Encounter (Signed)
Spoke to daughterHoyle Sauer. She states that she wanted to know if her mother can take ROSUVASTATIN on Mon ,Wed. Fri. INSTEAD OF EVERY OTHER DAY. DAUGHTER states her mother is getting confused when to take the medications.  Daughter states patient has not had an issue with taking medication so far.  Instructed daughter take medication mon., wed., fri. Until further notice from Dr [name]  Defer to Dr [name]

## 2017-03-10 NOTE — Telephone Encounter (Signed)
New Message    Pt c/o medication issue:  1. Name of Medication: rosuvastatin (CRESTOR) 5 MG tablet  2. How are you currently taking this medication (dosage and times per day)? As prescribed  3. Are you having a reaction (difficulty breathing--STAT)? No  4. What is your medication issue? Per Daughter thinks every other day may get confusing for her mother. Wants to know if prescription can be changed. Requesting call back

## 2017-03-10 NOTE — Telephone Encounter (Signed)
ok 

## 2017-03-10 NOTE — Telephone Encounter (Signed)
Daughter aware change medication list

## 2017-03-11 DIAGNOSIS — H401131 Primary open-angle glaucoma, bilateral, mild stage: Secondary | ICD-10-CM | POA: Diagnosis not present

## 2017-03-11 DIAGNOSIS — H1851 Endothelial corneal dystrophy: Secondary | ICD-10-CM | POA: Diagnosis not present

## 2017-03-11 DIAGNOSIS — H1820 Unspecified corneal edema: Secondary | ICD-10-CM | POA: Diagnosis not present

## 2017-03-11 DIAGNOSIS — H04123 Dry eye syndrome of bilateral lacrimal glands: Secondary | ICD-10-CM | POA: Diagnosis not present

## 2017-03-13 ENCOUNTER — Encounter (HOSPITAL_COMMUNITY): Payer: Medicare Other

## 2017-03-20 ENCOUNTER — Encounter (HOSPITAL_COMMUNITY): Payer: Medicare Other

## 2017-04-14 DIAGNOSIS — Z6825 Body mass index (BMI) 25.0-25.9, adult: Secondary | ICD-10-CM | POA: Diagnosis not present

## 2017-04-14 DIAGNOSIS — M25562 Pain in left knee: Secondary | ICD-10-CM | POA: Diagnosis not present

## 2017-04-25 ENCOUNTER — Other Ambulatory Visit (HOSPITAL_COMMUNITY): Payer: Self-pay | Admitting: Internal Medicine

## 2017-04-25 DIAGNOSIS — M7122 Synovial cyst of popliteal space [Baker], left knee: Secondary | ICD-10-CM

## 2017-04-30 ENCOUNTER — Ambulatory Visit (HOSPITAL_COMMUNITY)
Admission: RE | Admit: 2017-04-30 | Discharge: 2017-04-30 | Disposition: A | Discharge status: Home / Self Care | Payer: Medicare Other | Source: Ambulatory Visit | Attending: Internal Medicine | Admitting: Internal Medicine

## 2017-04-30 DIAGNOSIS — M7122 Synovial cyst of popliteal space [Baker], left knee: Secondary | ICD-10-CM | POA: Diagnosis not present

## 2017-04-30 IMAGING — US US EXTREM LOW*L* LIMITED
1 series · 14 of 25 positions shown · non-contrast
Comparison: No recent prior.

CLINICAL DATA: Baker cyst.  Left popliteal fossa pain.

EXAM:
ULTRASOUND LEFT LOWER EXTREMITY LIMITED
TECHNIQUE: Ultrasound examination of the lower extremity soft tissues was
performed in the area of clinical concern.

[Series 1: us extrem low*left* limited · 0.06mm/px · 14 of 25 slices shown]
[im 1/25]
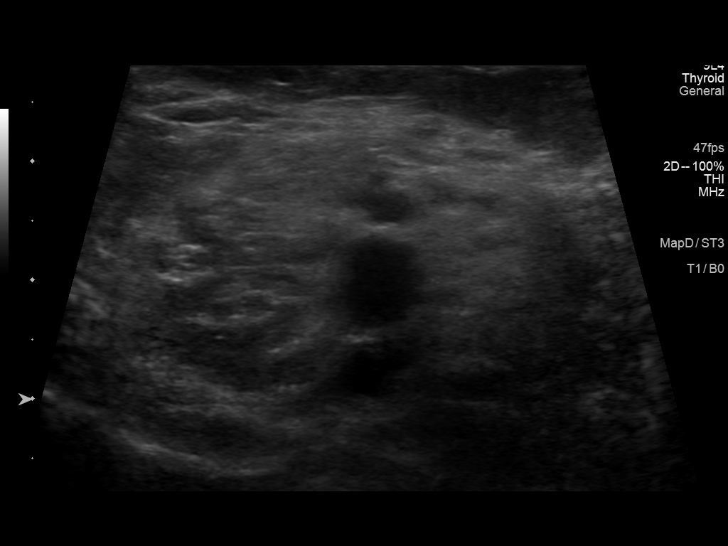
[im 3/25]
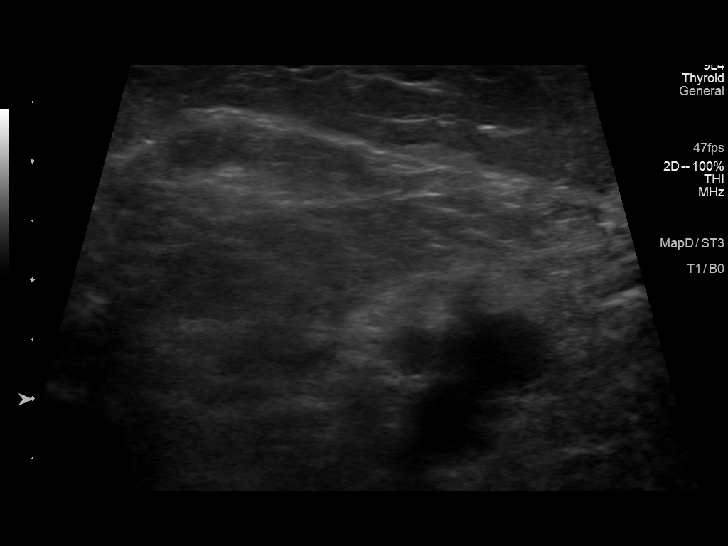
[im 5/25]
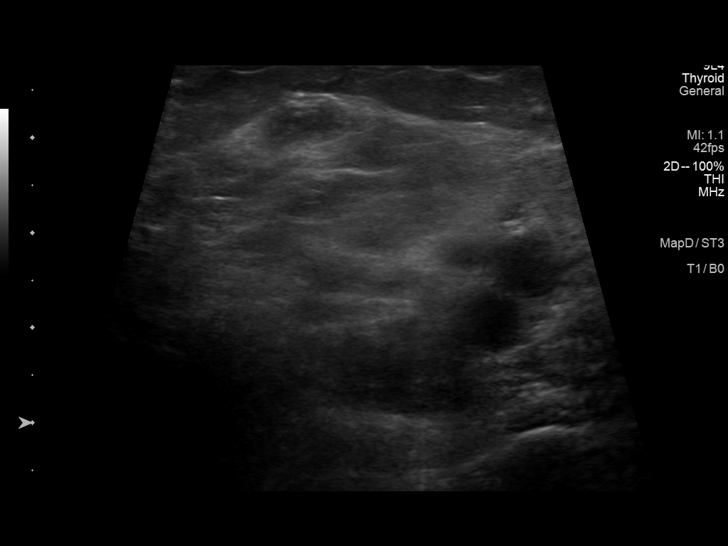
[im 7/25]
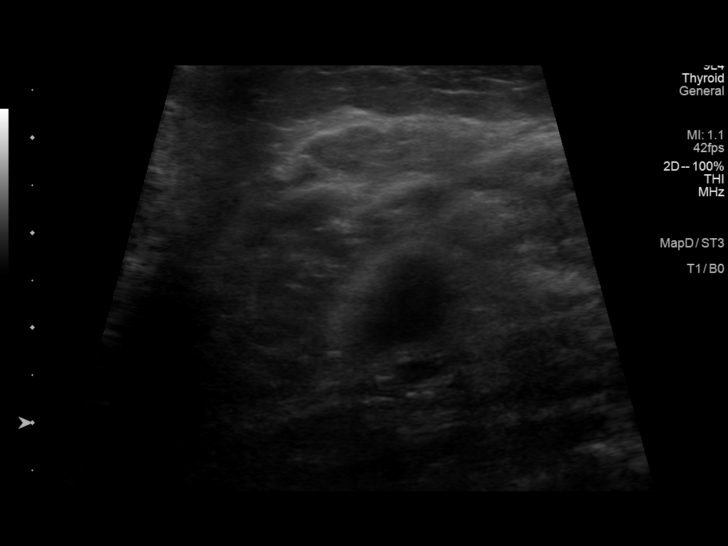
[im 9/25]
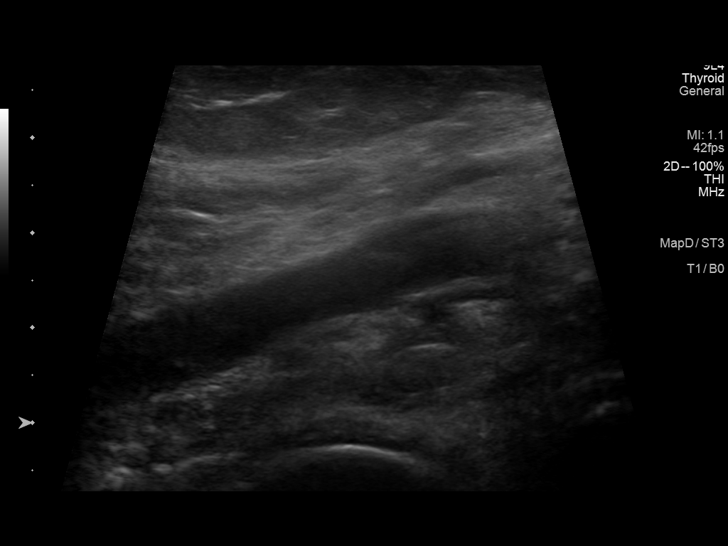
[im 10/25]
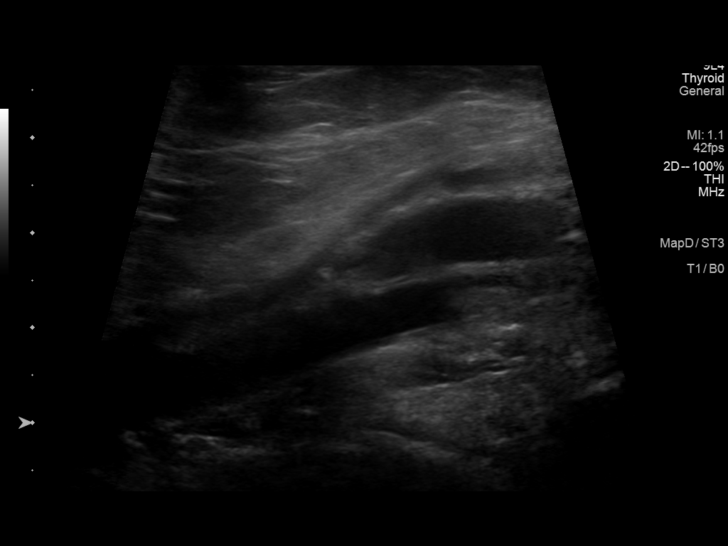
[im 12/25]
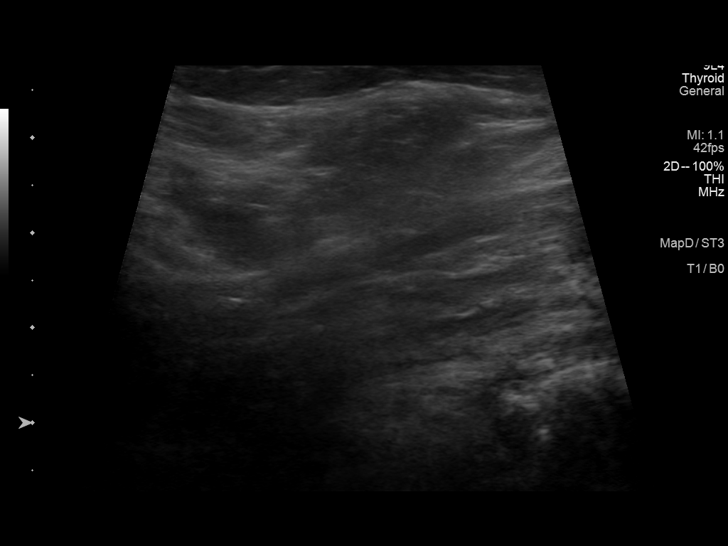
[im 14/25]
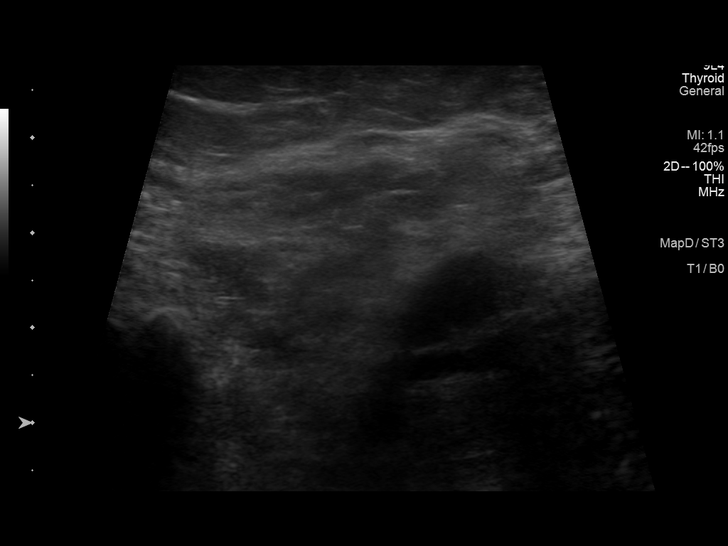
[im 16/25]
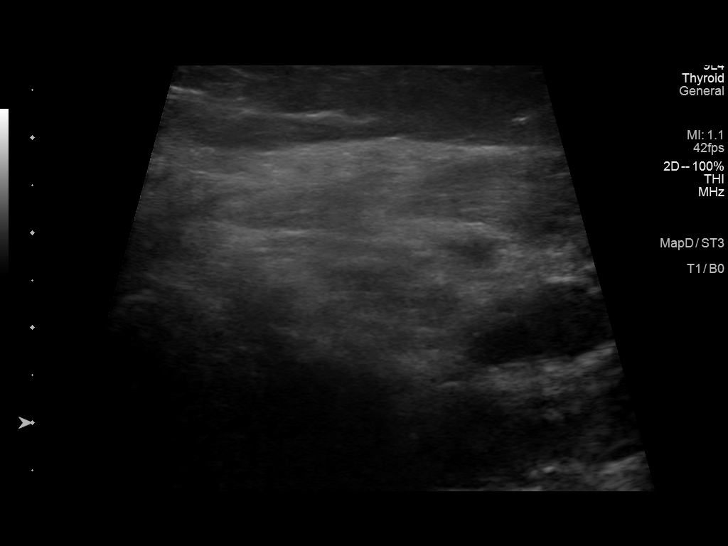
[im 17/25]
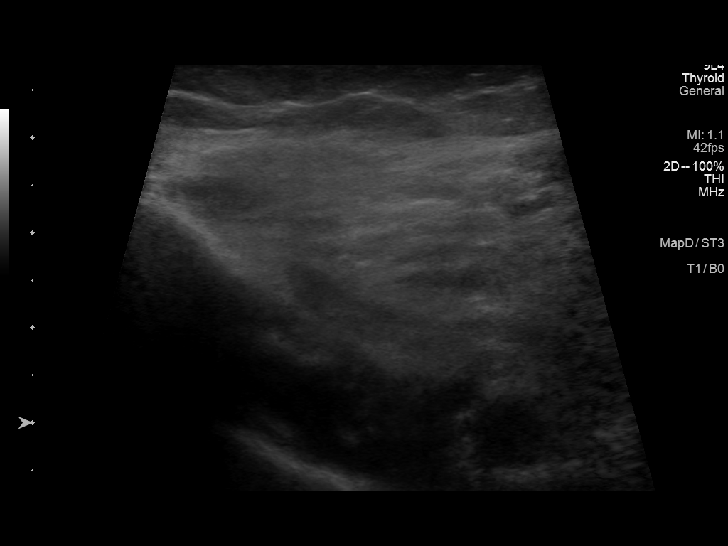
[im 19/25]
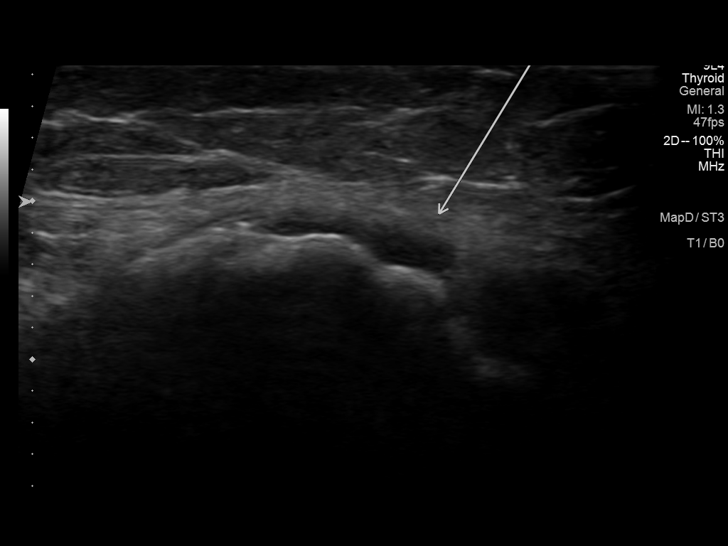
[im 21/25]
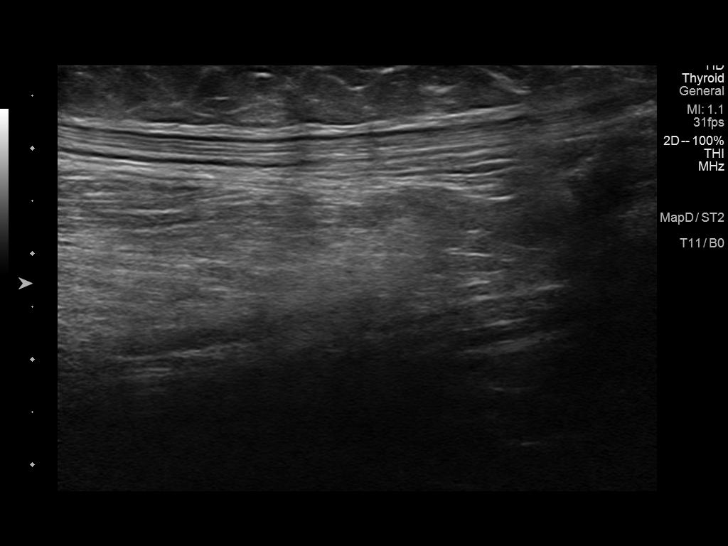
[im 23/25]
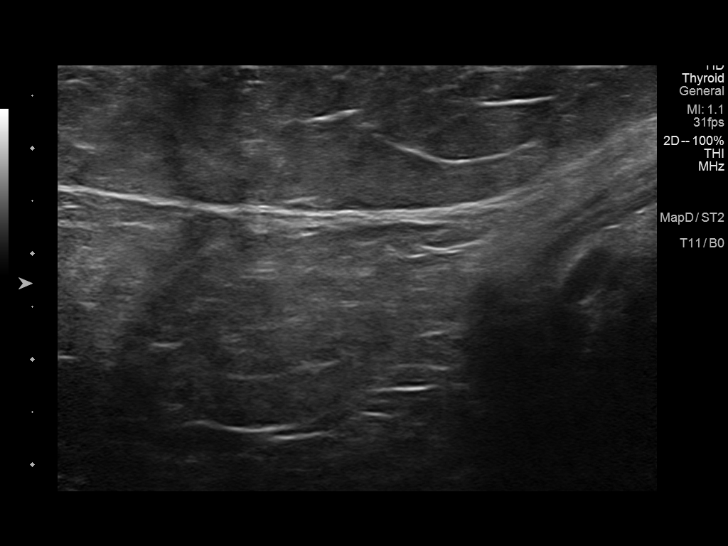
[im 25/25]
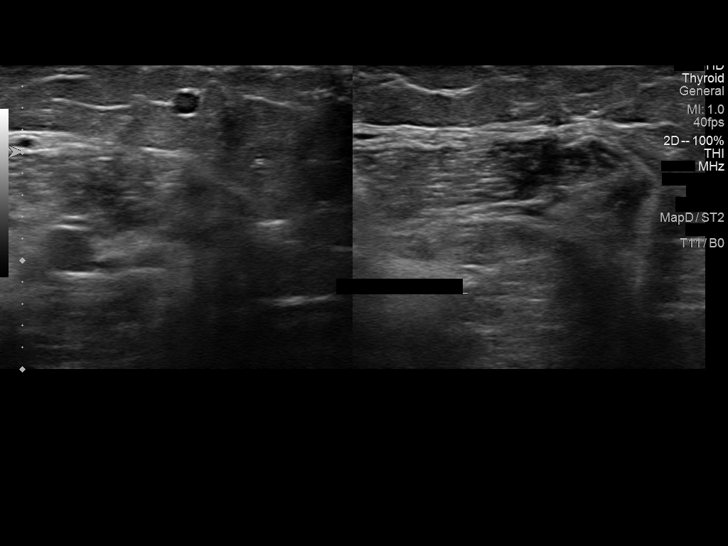

[14 of 25 positions shown; findings below may reference images not displayed]

FINDINGS: Small amount of fluid is noted along the posterior aspect of the
knee in the region of the patient's of pain. This could represent a
small effusion. This could represent a small Baker cyst with
possible rupture. No prominent Baker cyst noted. Small varicosity
noted .
IMPRESSION: Small amount of fluid noted along the posterior aspect of the knee
in the region of the patient's pain . Scratched it This could
represent small effusion. This could represent a small Baker's cyst
with possible rupture. No prominent Baker cyst noted. No solid mass
is identified.

## 2017-05-12 DIAGNOSIS — H1851 Endothelial corneal dystrophy: Secondary | ICD-10-CM | POA: Diagnosis not present

## 2017-05-12 DIAGNOSIS — H1820 Unspecified corneal edema: Secondary | ICD-10-CM | POA: Diagnosis not present

## 2017-05-12 DIAGNOSIS — H04123 Dry eye syndrome of bilateral lacrimal glands: Secondary | ICD-10-CM | POA: Diagnosis not present

## 2017-05-12 DIAGNOSIS — H5319 Other subjective visual disturbances: Secondary | ICD-10-CM | POA: Diagnosis not present

## 2017-05-12 DIAGNOSIS — H401131 Primary open-angle glaucoma, bilateral, mild stage: Secondary | ICD-10-CM | POA: Diagnosis not present

## 2017-05-13 DIAGNOSIS — N8111 Cystocele, midline: Secondary | ICD-10-CM | POA: Diagnosis not present

## 2017-05-14 ENCOUNTER — Encounter: Payer: Self-pay | Admitting: Internal Medicine

## 2017-05-16 ENCOUNTER — Encounter: Payer: Self-pay | Admitting: Cardiovascular Disease

## 2017-05-16 ENCOUNTER — Ambulatory Visit (INDEPENDENT_AMBULATORY_CARE_PROVIDER_SITE_OTHER): Payer: Medicare Other | Admitting: Cardiovascular Disease

## 2017-05-16 VITALS — BP 115/62 | HR 56 | Ht 66.5 in | Wt 162.4 lb

## 2017-05-16 DIAGNOSIS — E782 Mixed hyperlipidemia: Secondary | ICD-10-CM

## 2017-05-16 DIAGNOSIS — I10 Essential (primary) hypertension: Secondary | ICD-10-CM | POA: Diagnosis not present

## 2017-05-16 DIAGNOSIS — M7122 Synovial cyst of popliteal space [Baker], left knee: Secondary | ICD-10-CM

## 2017-05-16 DIAGNOSIS — I2510 Atherosclerotic heart disease of native coronary artery without angina pectoris: Secondary | ICD-10-CM | POA: Diagnosis not present

## 2017-05-16 DIAGNOSIS — E785 Hyperlipidemia, unspecified: Secondary | ICD-10-CM | POA: Diagnosis not present

## 2017-05-16 NOTE — Progress Notes (Signed)
error 

## 2017-05-16 NOTE — Patient Instructions (Signed)
Medication Instructions:   NO CHANGE  Labwork:  Your physician recommends that you return for lab work WHEN FASTING  Testing/Procedures:  Your physician has requested that you have a lexiscan myoview. For further information please visit [email or web address]. Please follow instruction sheet, as given.SCHEDULE IN NOVEMBER    Follow-Up:  Your physician wants you to follow-up in: [place] will receive a reminder letter in the mail two months in advance. If you don't receive a letter, please call our office to schedule the follow-up appointment.   If you need a refill on your cardiac medications before your next appointment, please call your pharmacy.

## 2017-05-16 NOTE — Progress Notes (Signed)
Cardiology Office Note    Date:  05/17/2017   ID:  Kara Woods, Kara Woods December 12, 1932, MRN [id number]  PCP:  Celene Squibb, MD  Referring M.D.: Dr. Velvet Bathe Cardiologist:  Shelva Majestic, MD   No chief complaint on file.   History of Present Illness:  Kara Woods is a 83 y.o. female who presents for a 3 month follow-up cardiology evaluation.  Kara Woods is a mother of my patient, who had suffered an out of hospital cardiac arrest years ago.  Kara Woods has a history of prior cardiac catheterization and from records [name] had undergone cardiac catheterization in 1998.  The patient states her coronaries were clean.  I do not have specifics of her catheterization findings.  Over the past 6 months, she has developed progressive decline in ability to be active and has noticed significant increasing exertionally precipitated shortness of breath.  She also has had some episodes of associated chest pressure with left arm radiation.  She had undergone an echo Doppler study on 10/24/2016 which showed an EF of 50% and suggested diffuse hypokinesis.  There was grade 1 diastolic dysfunction.  There was mild MR and mild LA dilatation.  She also had undergone pulmonary function testing was not significantly abnormal.  Her past history is notable for left breast CA and she is status post prior lumpectomy and radiation treatment.  She was cared for by Dr. Excell Seltzer.  She also is status post hysterectomy.  Remote history of cholecystectomy.  She has a history of retinal detachment.  When I saw her, due to progressive symptomatology with decline in exercise tolerance and increasing exertional dyspnea with a very strong family history of CAD.  Definitive cardiac catheterization was recommended.  This was performed by me on 11/04/2016 and revealed low normal global LV function with an EF of 50-55% with mild mid inferior focal hypocontractility.  There was multivessel CAD with multiple stenoses of 40-50% mid  to distal LAD.  She had a normal ramus intermediate vessel.  There was 80% focal stenosis in the left circumflex and 95% eccentric mid RCA stenosis followed by 50% narrowing.  She underwent initial PCI to the mid RCA with insertion of a 2.2512 mm Resolute Onyx stent postdilated to 2.5 mm with the 95% stenoses being reduced to 0%.  The following day, on 11/05/2016 she underwent staged intervention to the left circumflex flex artery with the 50 and 85% stenoses being reduced to 0% with insertion of a 2.7523 mm  Xience Alpine DES stent postdilated to 3.0.  Since her intervention, she has felt significantly improved.  She had mild dyspnea more attributed to air hunger and this typically is relieved when she takes a deep sigh.   She was started on Lipitor but did not tolerate this.  When I last saw her, I recommended initiation of Crestor at 5 mg 2 times a week.  She took 2 doses and then felt she had similar symptoms and stopped taking this.  She denies any exertional chest tightness.  She was diagnosed with a left knee Baker's cyst.  She denies palpitations.  She presents for cardiology reevaluation.  Past Medical History:  Diagnosis Date  . Anxiety   . Arthritis   . Blind left eye   . Breast disorder    cancer left  . Cancer ([place])    left breast   . Colitis   . Coronary artery disease    a. 10/2016 Staged PCI: RCA 50p, 41m(2.25x12  Resolute Onyx DES), LCX 50p, 32m(2.75x23 Xience Alpine DES). Residual LAD 50p/m, 462m.  . Marland Kitchenystocele 10/11/2013  . Depression   . Diastolic dysfunction    a. 09/2016 Echo: EF 50%, diffuse hypokinesis, mild LVH, grade 1 diastolic dysfunction, mild mitral regurgitation, mildly dilated left atrium.  . Marland KitchenERD (gastroesophageal reflux disease)    occasional Tums only  . HOH (hard of hearing)   . Pelvic relaxation 10/11/2013  . Proctitis    colonsocopy 2007, Canasa suppositories  . S/P endoscopy March 2009   mild erosive reflux esophagitis, Schatzki's ring, s/p  dilation  . Schatzki's ring   . Shortness of breath    occ if anxiety  . Wears dentures    upper  . Wears glasses     Past Surgical History:  Procedure Laterality Date  . ANTERIOR AND POSTERIOR REPAIR N/A 05/17/2014   Procedure: ANTERIOR (CYSTOCELE) AND POSTERIOR REPAIR (RECTOCELE);  Surgeon: ScReece PackerMD;  [facility];  Service: Urology;  Laterality: N/A;  . BREAST LUMPECTOMY WITH NEEDLE LOCALIZATION AND AXILLARY SENTINEL LYMPH NODE BX Left 05/03/2013   Procedure: BREAST LUMPECTOMY WITH NEEDLE LOCALIZATION AND AXILLARY SENTINEL LYMPH NODE BX;  Surgeon: BeEdward JollyMD;  [place] [facility] Service: General;  Laterality: Left;  . BREAST SURGERY Left 05/19/13  . CARDIAC CATHETERIZATION  1998  . CARDIAC CATHETERIZATION N/A 11/04/2016   Procedure: Left Heart Cath and Coronary Angiography;  Surgeon: ThTroy SineMD;  [facility];  Service: Cardiovascular;  Laterality: N/A;  . CARDIAC CATHETERIZATION N/A 11/05/2016   Procedure: Coronary Stent Intervention;  Surgeon: ThTroy SineMD;  [facility];  Service: Cardiovascular;  Laterality: N/A;  . COLONOSCOPY  05/06/2006   Diffuse inflammatory changes of the rectal mucosa, consistent  with proctitis.  Otherwise, normal colon to terminal ileum  . CORONARY ANGIOPLASTY  11/04/2016  . CORONARY STENT PLACEMENT     Drug-eluting coronary artery stent, non-bioabsorbable-polymer-coated  . CYSTOSCOPY N/A 05/17/2014   Procedure: CYSTOSCOPY;  Surgeon: ScReece PackerMD;  [facility];  Service: Urology;  Laterality: N/A;  . ESOPHAGOGASTRODUODENOSCOPY  02/09/2008   Schatzki ring status post dilation/Distal esophageal erosion consistent with mild erosive reflux  esophagitis, otherwise unremarkable esophagus, normal stomach, D1, D2.  . EYE SURGERY     lt cataract-implant  . EYE SURGERY     lt-lens repaced,l  . [facility]  1993  . RE-EXCISION OF BREAST LUMPECTOMY  Left 05/19/2013   Procedure: RE-EXCISION OF BREAST LUMPECTOMY;  Surgeon: BeEdward JollyMD;  [place] [facility] Service: General;  Laterality: Left;  . RETINAL DETACHMENT SURGERY  1978   Left  . VAGINAL HYSTERECTOMY Bilateral 05/17/2014   Procedure: HYSTERECTOMY VAGINAL with Bilateral Salpingo-Oophorectomy; Bladder cystotomy repair;  Surgeon: ShMarvene StaffMD;  [facility];  Service: Gynecology;  Laterality: Bilateral;  . VAGINAL PROLAPSE REPAIR N/A 05/17/2014   Procedure: VAGINAL VAULT PROLAPSE AND GRAFT;  Surgeon: ScReece PackerMD;  [place];  Service: Urology;  Laterality: N/A;    Current Medications: Outpatient Medications Prior to Visit  Medication Sig Dispense Refill  . acetaminophen (TYLENOL) 500 MG tablet Take 500-1,000 mg by mouth every 6 (six) hours as needed for mild pain.    . Marland Kitchenspirin EC 81 MG tablet Take 1 tablet (81 mg total) by mouth daily. 90 tablet 3  . carboxymethylcellulose (REFRESH PLUS) 0.5 % SOLN Place 3-4 drops into both eyes 3 (three)  times daily as needed (dry eyes). Preservative free    . clorazepate (TRANXENE) 15 MG tablet Take 7.5 mg by mouth 2 (two) times daily as needed for anxiety.    . nitroGLYCERIN (NITROSTAT) 0.4 MG SL tablet Place 1 tablet (0.4 mg total) under the tongue every 5 (five) minutes as needed. 25 tablet 3  . ticagrelor (BRILINTA) 90 MG TABS tablet Take 1 tablet (90 mg total) by mouth 2 (two) times daily. 60 tablet 0  . Travoprost, BAK Free, (TRAVATAN) 0.004 % SOLN ophthalmic solution Place 1 drop into the right eye at bedtime.     Marland Kitchen ezetimibe (ZETIA) 10 MG tablet Take 1 tablet (10 mg total) by mouth daily. 30 tablet 6  . metoprolol tartrate (LOPRESSOR) 25 MG tablet Take 0.5 tablets (12.5 mg total) by mouth 2 (two) times daily. 180 tablet 3  . rosuvastatin (CRESTOR) 5 MG tablet Take Monday,Wednesday,friday 30 tablet 3   No facility-administered medications prior to visit.      Allergies:    Contrast media [iodinated diagnostic agents]; Lipitor [atorvastatin]; and Sulfonamide derivatives   Social History   Social History  . Marital status: Widowed    Spouse name: N/A  . Number of children: N/A  . Years of education: N/A   Occupational History  . Retired    Social History Main Topics  . Smoking status: Former Smoker    Packs/day: 1.00    Types: Cigarettes    Quit date: 04/28/1990  . Smokeless tobacco: Never Used     Comment: quit 19 yrs ago  . Alcohol use Yes     Comment: a glass of wine once or twice a week  . Drug use: No  . Sexual activity: Not Currently    Birth control/ protection: Post-menopausal   Other Topics Concern  . None   Social History Narrative  . None     Family History:  The patient's family history includes Cancer in her brother.  I mother died and had a myocardial infarction.  Her father also had suffered a myocardial infarction.  She had 3 sisters who also had myocardial infarction and also one of her brothers also had suffered a myocardial infarction.  ROS General: Negative; No fevers, chills, or night sweats;  HEENT: History of retinal detachment, sinus congestion, difficulty swallowing Pulmonary: Negative; No cough, wheezing, shortness of breath, hemoptysis Cardiovascular: see HPI GI: Negative; No nausea, vomiting, diarrhea, or abdominal pain GU: Negative; No dysuria, hematuria, or difficulty voiding Musculoskeletal: Positive for left Baker's cyst Hematologic/Oncology: Negative; no easy bruising, bleeding Endocrine: Negative; no heat/cold intolerance; no diabetes Neuro: Negative; no changes in balance, headaches Skin: Negative; No rashes or skin lesions Psychiatric: Negative; No behavioral problems, depression Sleep: Negative; No snoring, daytime sleepiness, hypersomnolence, bruxism, restless legs, hypnogognic hallucinations, no cataplexy Other comprehensive 14 point system review is negative.   PHYSICAL EXAM:   VS:  BP 115/62    Pulse (!) 56   Ht 5' 6.5" (1.689 m)   Wt 162 lb 6.4 oz (73.7 kg)   BMI 25.82 kg/m     Wt Readings from Last 3 Encounters:  05/16/17 162 lb 6.4 oz (73.7 kg)  02/12/17 169 lb (76.7 kg)  01/09/17 165 lb (74.8 kg)     Physical Exam BP 115/62   Pulse (!) 56   Ht 5' 6.5" (1.689 m)   Wt 162 lb 6.4 oz (73.7 kg)   BMI 25.82 kg/m  General: Alert, oriented, no distress.  Skin: normal turgor, no rashes, warm  and dry HEENT: Normocephalic, atraumatic. Pupils equal round and reactive to light; sclera anicteric; extraocular muscles intact;  Nose without nasal septal hypertrophy Mouth/Parynx benign; Mallinpatti scale 3 Neck: No JVD, no carotid bruits; normal carotid upstroke Lungs: clear to ausculatation and percussion; no wheezing or rales Chest wall: without tenderness to palpitation Heart: PMI not displaced, RRR, s1 s2 normal, 1/6 systolic murmur, no diastolic murmur, no rubs, gallops, thrills, or heaves Abdomen: soft, nontender; no hepatosplenomehaly, BS+; abdominal aorta nontender and not dilated by palpation. Back: no CVA tenderness Pulses 2+ Musculoskeletal: full range of motion, normal strength, no joint deformities Extremities: no clubbing cyanosis or edema, Homan's sign negative  Neurologic: grossly nonfocal; Cranial nerves grossly wnl Psychologic: Normal mood and affect   Studies/Labs Reviewed:   ECG (independently read by me): Sinus bradycardia 56 bpm.  Normal intervals.  No significant ST-T changes.  March 2018 ECG (independently read by me): Sinus bradycardia 54 bpm.  Normal intervals.  No ST segment changes.  December 2017 ECG (independently read by me): Normal sinus rhythm at 74 bpm.  No ectopy.  QTc interval 455 ms.  Recent Labs: BMP Latest Ref Rng & Units 11/19/2016 11/11/2016 11/06/2016  Glucose 65 - 99 mg/dL 95 90 107(H)  BUN 6 - 20 mg/dL 12 11 10   Creatinine 0.44 - 1.00 mg/dL 0.85 0.72 0.74  Sodium 135 - 145 mmol/L 139 141 142  Potassium 3.5 - 5.1 mmol/L 4.6 4.5  3.4(L)  Chloride 101 - 111 mmol/L 105 106 111  CO2 22 - 32 mmol/L 25 23 21(L)  Calcium 8.9 - 10.3 mg/dL 9.1 9.0 8.8(L)     Hepatic Function Latest Ref Rng & Units 11/19/2016 10/31/2016 05/28/2014  Total Protein 6.5 - 8.1 g/dL 6.8 7.1 7.7  Albumin 3.5 - 5.0 g/dL 3.7 4.3 3.4(L)  AST 15 - 41 U/L 19 17 15   ALT 14 - 54 U/L 14 10 9   Alk Phosphatase 38 - 126 U/L 57 55 75  Total Bilirubin 0.3 - 1.2 mg/dL 0.6 0.4 0.3  Bilirubin, Direct 0.1 - 0.5 mg/dL 0.1 - -    CBC Latest Ref Rng & Units 11/19/2016 11/06/2016 11/05/2016  WBC 4.0 - 10.5 K/uL 9.1 13.6(H) 13.1(H)  Hemoglobin 12.0 - 15.0 g/dL 12.7 12.2 12.1  Hematocrit 36.0 - 46.0 % 39.0 36.0 37.4  Platelets 150 - 400 K/uL 236 250 224   Lab Results  Component Value Date   MCV 93.8 11/19/2016   MCV 91.4 11/06/2016   MCV 93.3 11/05/2016   No results found for: TSH No results found for: HGBA1C   BNP    Component Value Date/Time   BNP 51.0 09/01/2016 1012    ProBNP No results found for: PROBNP   Lipid Panel     Component Value Date/Time   CHOL 227 (H) 10/31/2016 1245   TRIG 164 (H) 10/31/2016 1245   HDL 47 (L) 10/31/2016 1245   CHOLHDL 4.8 10/31/2016 1245   VLDL 33 (H) 10/31/2016 1245   LDLCALC 147 (H) 10/31/2016 1245     RADIOLOGY: [place] Lt Lower Extrem Ltd Soft Tissue Non Vascular  Result Date: 04/30/2017 CLINICAL DATA:  Baker cyst.  Left popliteal fossa pain. EXAM: ULTRASOUND LEFT LOWER EXTREMITY LIMITED TECHNIQUE: Ultrasound examination of the lower extremity soft tissues was performed in the area of clinical concern. COMPARISON:  No recent prior. FINDINGS: Small amount of fluid is noted along the posterior aspect of the knee in the region of the patient's of pain. This could represent a small effusion. This  could represent a small Baker cyst with possible rupture. No prominent Baker cyst noted. Small varicosity noted . IMPRESSION: Small amount of fluid noted along the posterior aspect of the knee in the region of the patient's  pain . Scratched it This could represent small effusion. This could represent a small Baker's cyst with possible rupture. No prominent Baker cyst noted. No solid mass is identified. Electronically Signed   By: Marcello Moores  Register   On: 04/30/2017 13:40     Additional studies/ records that were reviewed today include:   At her last office visit I reviewed the office records of [name].  I reviewed her echo Doppler study and pulmonary function tests.    ASSESSMENT:    1. Coronary artery disease involving native coronary artery of native heart without angina pectoris   2. Essential hypertension   3. Mixed hyperlipidemia   4. Hyperlipidemia with target LDL less than 70   5. Baker's cyst of knee, left      PLAN:  Kara Woods is an 83 year old Caucasian female who had developed progressive decline in exercise tolerance with significant increasing shortness of breath with minimal activity associated with chest pressure and left arm radiation.  In 1998, reportedly a cardiac catheterization had shown normal coronary arteries.  An echo Doppler study from November 2017 showed an EF of 50% with diffuse hypokinesis.  There was mild MR and mild dilatation of her left atrium. Her pulmonary function studies did not identified significant pulmonary disability in the etiology of her dyspnea.  Her very strong family history of myocardial infarctions in multiple family members, as well as remote tobacco history and progressive changes in symptomatology.  Cardiac catheterization revealed severe multivessel CAD as noted above and she underwent successful stenting of her RCA as well as her left circumflex vessels and a stage procedure.  Subsequent, she has been without chest pain.  Her exertional capacity has increased.  She has a history of significant hyperlipidemia and was intolerant to Lipitor secondary to myalgias.  When I last saw her, I initiated Crestor 5 mg twice a week and only after 2 doses she felt  similar symptoms and stopped taking the medication.  She is on Zetia 10 mg.  I will recheck her lipid studies.  I discussed the possibility of trying livalo which has a different hepatic metabolism and discuss the potential initiation ofPCSK9 inhibition therapy if she is unable to get her LDL lower.  She experiences an occasional side.  She denies any exertional chest pain.  There is no recurrent angina.  Her blood pressure today is stable.  Her weight is stable with a BMI of 25.8.  She has been bothered by a left Baker's cyst and may require orthopedic evaluation.  She currently is on dual antiplatelet therapy with aspirin and Kara Woods and has been without bleeding.  In 6 months, she will undergo a one-year follow-up Lexiscan Myoview study particular since she did have concomitant disease and she will be one year following her 2 vessel intervention to her circumflex and RCA.  I will see her in follow-up and further recommendations will be made at that time.  Medication Adjustments/Labs and Tests Ordered: Current medicines are reviewed at length with the patient today.  Concerns regarding medicines are outlined above.  Medication changes, Labs and Tests ordered today are listed in the Patient Instructions below.  Patient Instructions  Medication Instructions:   NO CHANGE  Labwork:  Your physician recommends that you return for lab work  WHEN FASTING  Testing/Procedures:  Your physician has requested that you have a lexiscan myoview. For further information please visit [email or web address]. Please follow instruction sheet, as given.SCHEDULE IN NOVEMBER    Follow-Up:  Your physician wants you to follow-up in: [place] will receive a reminder letter in the mail two months in advance. If you don't receive a letter, please call our office to schedule the follow-up appointment.   If you need a refill on your cardiac medications before your next appointment, please call your pharmacy.         Signed, Shelva Majestic, MD, [facility] 05/17/2017 9:27 AM    [facility] [address] [place], [place], [place]  [address] Phone: [phone]

## 2017-05-19 DIAGNOSIS — H04123 Dry eye syndrome of bilateral lacrimal glands: Secondary | ICD-10-CM | POA: Diagnosis not present

## 2017-05-19 DIAGNOSIS — H1851 Endothelial corneal dystrophy: Secondary | ICD-10-CM | POA: Diagnosis not present

## 2017-05-19 DIAGNOSIS — H1820 Unspecified corneal edema: Secondary | ICD-10-CM | POA: Diagnosis not present

## 2017-05-19 DIAGNOSIS — H5319 Other subjective visual disturbances: Secondary | ICD-10-CM | POA: Diagnosis not present

## 2017-05-19 DIAGNOSIS — H16141 Punctate keratitis, right eye: Secondary | ICD-10-CM | POA: Diagnosis not present

## 2017-05-19 DIAGNOSIS — H16221 Keratoconjunctivitis sicca, not specified as Sjogren's, right eye: Secondary | ICD-10-CM | POA: Diagnosis not present

## 2017-05-19 DIAGNOSIS — H401131 Primary open-angle glaucoma, bilateral, mild stage: Secondary | ICD-10-CM | POA: Diagnosis not present

## 2017-05-22 ENCOUNTER — Telehealth: Payer: Self-pay | Admitting: Cardiovascular Disease

## 2017-05-22 NOTE — Telephone Encounter (Signed)
New Message  Pt daughter call requesting to speak with RN. Pt daughter states she will discuss upon call back. Please call back to discuss

## 2017-05-22 NOTE — Telephone Encounter (Signed)
Pt in donut hole. Sample request for Brilinta - she also inquired about discount card but pt has already been enrolled with the provisional 30-day free supply. Informed her I could provide samples, caller amenable to this and aware to call if new needs, informed her I am happy to provide samples of this medication if available.

## 2017-06-05 DIAGNOSIS — I10 Essential (primary) hypertension: Secondary | ICD-10-CM | POA: Diagnosis not present

## 2017-06-10 DIAGNOSIS — E782 Mixed hyperlipidemia: Secondary | ICD-10-CM | POA: Diagnosis not present

## 2017-06-10 DIAGNOSIS — I10 Essential (primary) hypertension: Secondary | ICD-10-CM | POA: Diagnosis not present

## 2017-06-10 DIAGNOSIS — I2510 Atherosclerotic heart disease of native coronary artery without angina pectoris: Secondary | ICD-10-CM | POA: Diagnosis not present

## 2017-06-10 DIAGNOSIS — F411 Generalized anxiety disorder: Secondary | ICD-10-CM | POA: Diagnosis not present

## 2017-06-10 DIAGNOSIS — Z6825 Body mass index (BMI) 25.0-25.9, adult: Secondary | ICD-10-CM | POA: Diagnosis not present

## 2017-06-24 ENCOUNTER — Emergency Department (HOSPITAL_COMMUNITY)
Admission: EM | Admit: 2017-06-24 | Discharge: 2017-06-24 | Disposition: A | Discharge status: Home / Self Care | Payer: Medicare Other | Attending: Emergency Medicine | Admitting: Emergency Medicine

## 2017-06-24 ENCOUNTER — Encounter (HOSPITAL_COMMUNITY): Payer: Self-pay

## 2017-06-24 ENCOUNTER — Emergency Department (HOSPITAL_COMMUNITY): Payer: Medicare Other

## 2017-06-24 DIAGNOSIS — R5381 Other malaise: Secondary | ICD-10-CM | POA: Diagnosis not present

## 2017-06-24 DIAGNOSIS — R531 Weakness: Secondary | ICD-10-CM | POA: Diagnosis not present

## 2017-06-24 DIAGNOSIS — Z7982 Long term (current) use of aspirin: Secondary | ICD-10-CM | POA: Diagnosis not present

## 2017-06-24 DIAGNOSIS — R42 Dizziness and giddiness: Secondary | ICD-10-CM | POA: Insufficient documentation

## 2017-06-24 DIAGNOSIS — R519 Headache, unspecified: Secondary | ICD-10-CM

## 2017-06-24 DIAGNOSIS — Z853 Personal history of malignant neoplasm of breast: Secondary | ICD-10-CM | POA: Diagnosis not present

## 2017-06-24 DIAGNOSIS — I2510 Atherosclerotic heart disease of native coronary artery without angina pectoris: Secondary | ICD-10-CM | POA: Insufficient documentation

## 2017-06-24 DIAGNOSIS — Z955 Presence of coronary angioplasty implant and graft: Secondary | ICD-10-CM | POA: Insufficient documentation

## 2017-06-24 DIAGNOSIS — I110 Hypertensive heart disease with heart failure: Secondary | ICD-10-CM | POA: Diagnosis not present

## 2017-06-24 DIAGNOSIS — I5030 Unspecified diastolic (congestive) heart failure: Secondary | ICD-10-CM | POA: Insufficient documentation

## 2017-06-24 DIAGNOSIS — Z87891 Personal history of nicotine dependence: Secondary | ICD-10-CM | POA: Diagnosis not present

## 2017-06-24 DIAGNOSIS — L0100 Impetigo, unspecified: Secondary | ICD-10-CM | POA: Diagnosis not present

## 2017-06-24 DIAGNOSIS — Z79899 Other long term (current) drug therapy: Secondary | ICD-10-CM | POA: Insufficient documentation

## 2017-06-24 DIAGNOSIS — R51 Headache: Secondary | ICD-10-CM | POA: Diagnosis not present

## 2017-06-24 DIAGNOSIS — R21 Rash and other nonspecific skin eruption: Secondary | ICD-10-CM | POA: Diagnosis present

## 2017-06-24 LAB — I-STAT TROPONIN, ED: Troponin i, poc: 0.01 ng/mL (ref 0.00–0.08)

## 2017-06-24 LAB — CBC WITH DIFFERENTIAL/PLATELET
Basophils Absolute: 0 10*3/uL (ref 0.0–0.1)
Basophils Relative: 0 %
Eosinophils Absolute: 0.3 10*3/uL (ref 0.0–0.7)
Eosinophils Relative: 3 %
HCT: 37.6 % (ref 36.0–46.0)
Hemoglobin: 12.4 g/dL (ref 12.0–15.0)
Lymphocytes Relative: 31 %
Lymphs Abs: 2.5 10*3/uL (ref 0.7–4.0)
MCH: 30.8 pg (ref 26.0–34.0)
MCHC: 33 g/dL (ref 30.0–36.0)
MCV: 93.3 fL (ref 78.0–100.0)
Monocytes Absolute: 0.5 10*3/uL (ref 0.1–1.0)
Monocytes Relative: 7 %
Neutro Abs: 4.7 10*3/uL (ref 1.7–7.7)
Neutrophils Relative %: 59 %
Platelets: 269 10*3/uL (ref 150–400)
RBC: 4.03 MIL/uL (ref 3.87–5.11)
RDW: 15 % (ref 11.5–15.5)
WBC: 8 10*3/uL (ref 4.0–10.5)

## 2017-06-24 LAB — COMPREHENSIVE METABOLIC PANEL
ALT: 15 U/L (ref 14–54)
AST: 24 U/L (ref 15–41)
Albumin: 3.7 g/dL (ref 3.5–5.0)
Alkaline Phosphatase: 58 U/L (ref 38–126)
Anion gap: 6 (ref 5–15)
BUN: 12 mg/dL (ref 6–20)
CO2: 28 mmol/L (ref 22–32)
Calcium: 8.9 mg/dL (ref 8.9–10.3)
Chloride: 106 mmol/L (ref 101–111)
Creatinine, Ser: 0.77 mg/dL (ref 0.44–1.00)
GFR calc Af Amer: >60 mL/min (ref >60)
GFR calc non Af Amer: >60 mL/min (ref >60)
Glucose, Bld: 96 mg/dL (ref 65–99)
Potassium: 4.8 mmol/L (ref 3.5–5.1)
Sodium: 140 mmol/L (ref 135–145)
Total Bilirubin: 0.7 mg/dL (ref 0.3–1.2)
Total Protein: 7.2 g/dL (ref 6.5–8.1)

## 2017-06-24 LAB — URINALYSIS, ROUTINE W REFLEX MICROSCOPIC
Bilirubin Urine: NEGATIVE
Glucose, UA: NEGATIVE mg/dL
Hgb urine dipstick: NEGATIVE
Ketones, ur: NEGATIVE mg/dL
Nitrite: NEGATIVE
Protein, ur: NEGATIVE mg/dL
Specific Gravity, Urine: 1.011 (ref 1.005–1.030)
pH: 7 (ref 5.0–8.0)

## 2017-06-24 LAB — PROTIME-INR
INR: 0.82
Prothrombin Time: 11.3 seconds — ABNORMAL LOW (ref 11.4–15.2)

## 2017-06-24 LAB — APTT: aPTT: 21 seconds — ABNORMAL LOW (ref 24–36)

## 2017-06-24 IMAGING — CT CT HEAD W/O CM
3 series · 16 of 47 positions shown, 19 images · non-contrast
Comparison: [DATE]

CLINICAL DATA: Weakness, headache.

EXAM:
CT HEAD WITHOUT CONTRAST
TECHNIQUE: Contiguous axial images were obtained from the base of the skull
through the vertex without intravenous contrast.

[Series 2: head wo · axial · 0.47mm/px · z∈[+141,+266]mm · 10 of 31 slices shown, 13 images]
[im 3/31  brain]
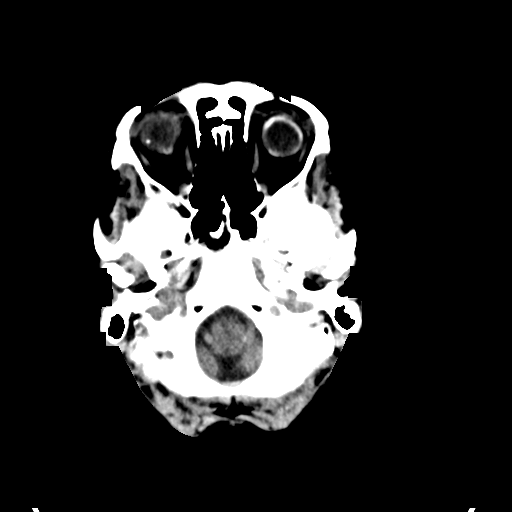
[im 3/31  bone]
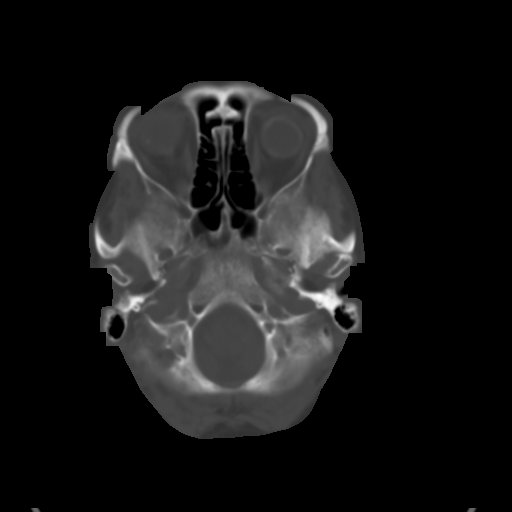
[im 6/31  brain]
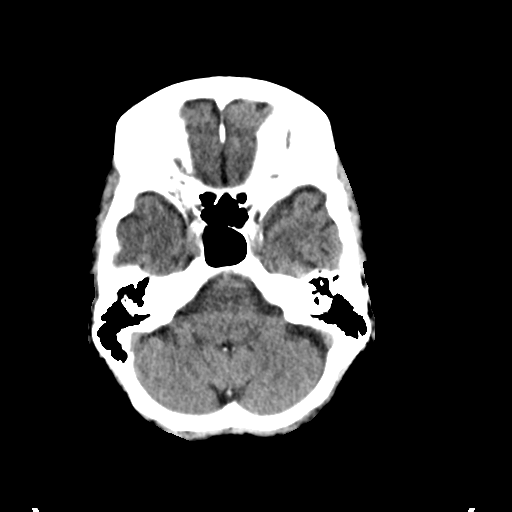
[im 9/31  brain]
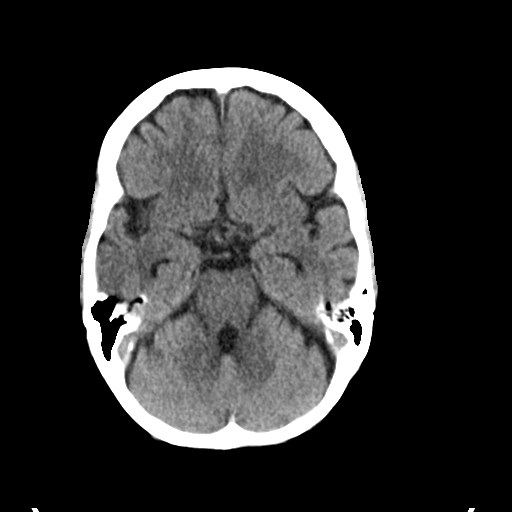
[im 11/31  brain]
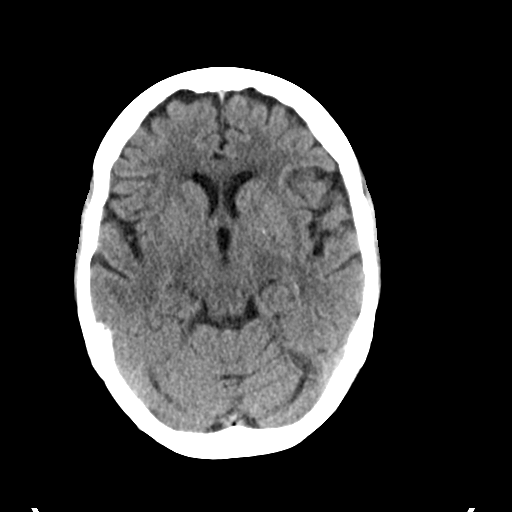
[im 14/31  brain]
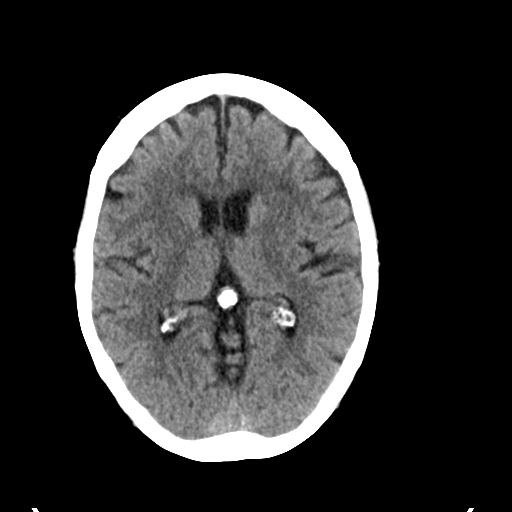
[im 14/31  bone]
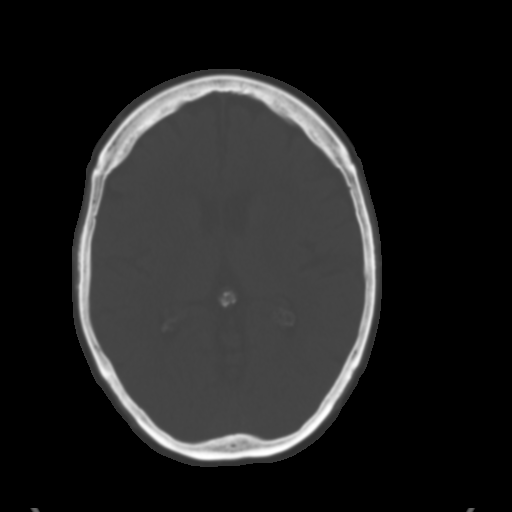
[im 17/31  brain]
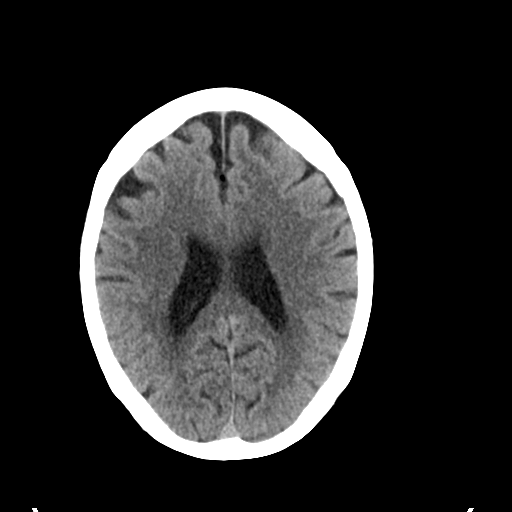
[im 20/31  brain]
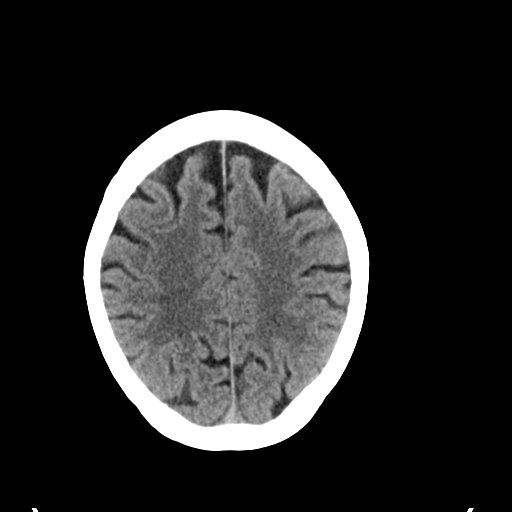
[im 23/31  brain]
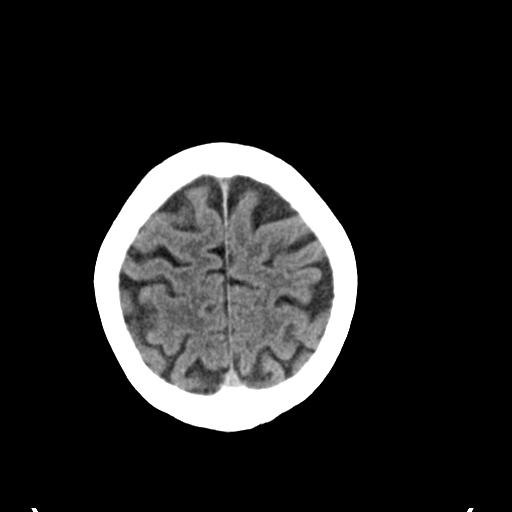
[im 25/31  brain]
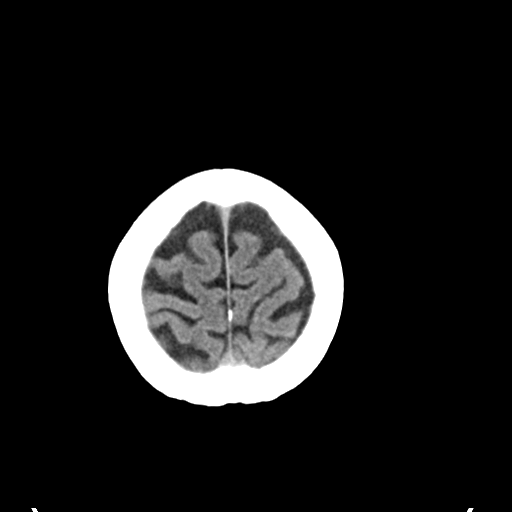
[im 25/31  bone]
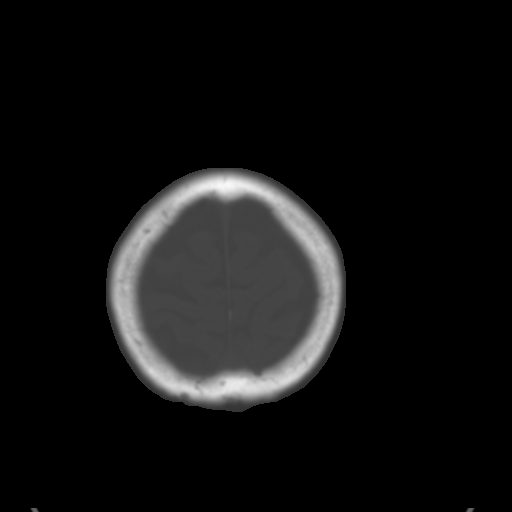
[im 28/31  brain]
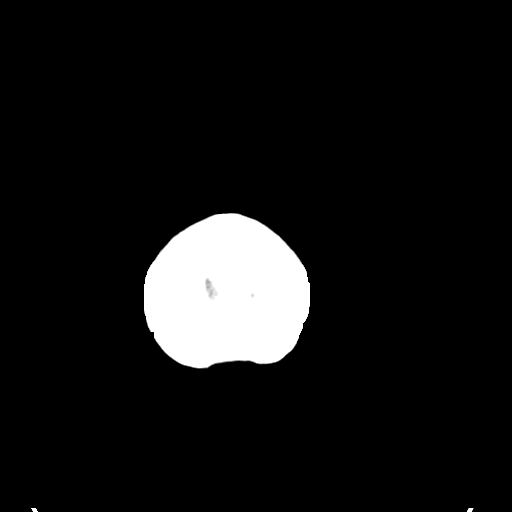

[Series 4: coronal soft tissue · coronal · 0.28mm/px · 3 of 67 slices shown]
[im 23/67  brain]
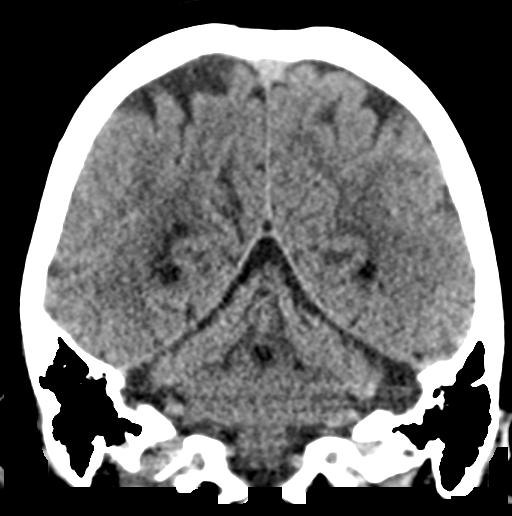
[im 30/67  brain]
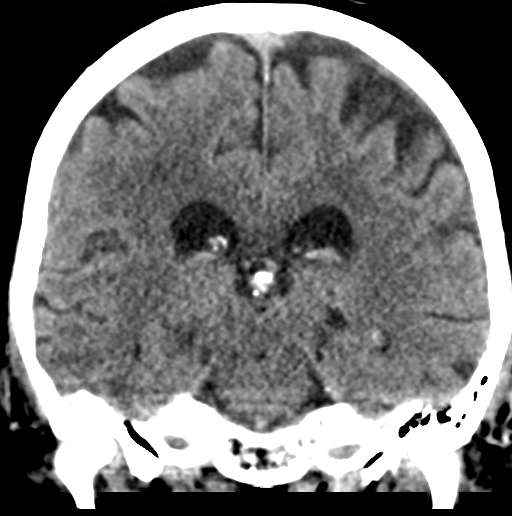
[im 37/67  brain]
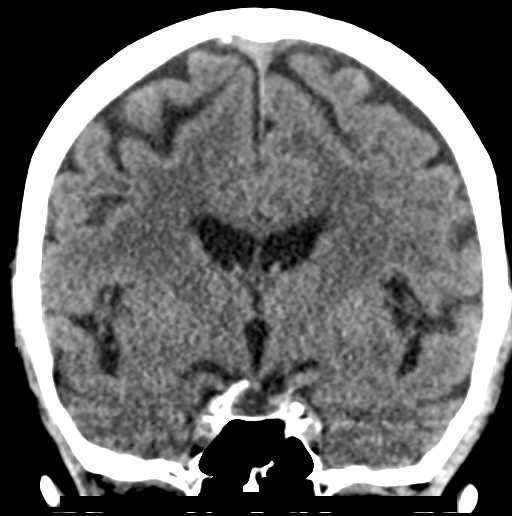

[Series 5: sagittal soft tissue · sagittal · 0.30mm/px · 3 of 49 slices shown]
[im 17/49  brain]
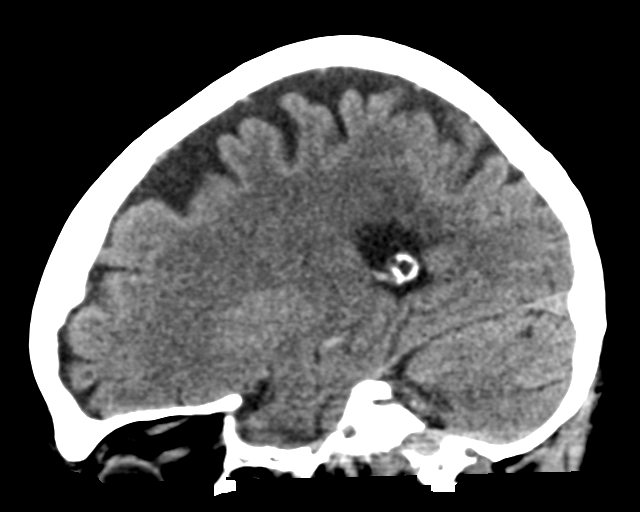
[im 25/49  brain]
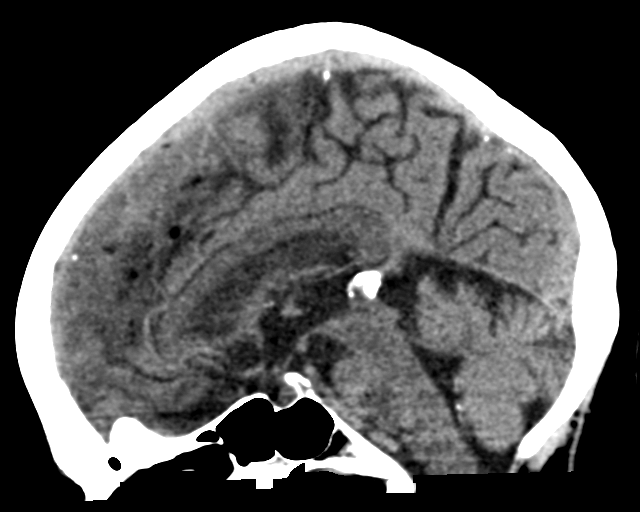
[im 33/49  brain]
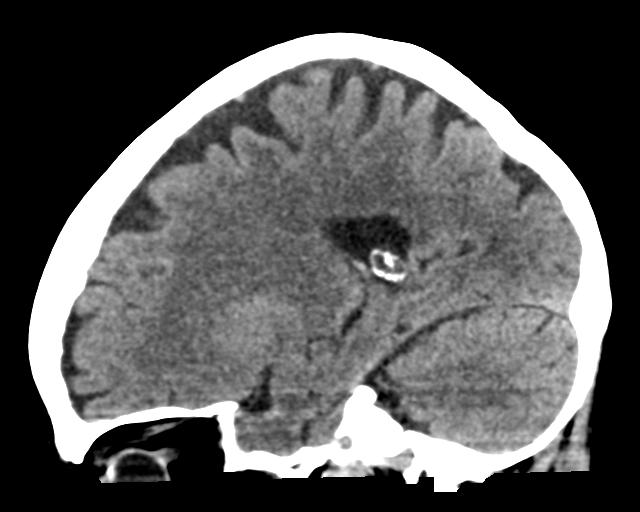

[16 of 47 positions shown; findings below may reference images not displayed]

FINDINGS: Brain: Mild age related volume loss. No acute intracranial
abnormality. Specifically, no hemorrhage, hydrocephalus, mass
lesion, acute infarction, or significant intracranial injury.

Vascular: No hyperdense vessel or unexpected calcification.

Skull: No acute calvarial abnormality.

Sinuses/Orbits: Visualized paranasal sinuses and mastoids clear.
Orbital soft tissues unremarkable.

Other: None
IMPRESSION: No acute intracranial abnormality.

## 2017-06-24 MED ORDER — CEPHALEXIN 500 MG PO CAPS: 500.0000 mg | ORAL_CAPSULE | Freq: Four times a day (QID) | ORAL | 0 refills | Status: AC

## 2017-06-24 NOTE — ED Provider Notes (Signed)
[facility] DEPT Provider Note   CSN: [id number] Arrival date & time: 06/24/17  5277     History   Chief Complaint Chief Complaint  Patient presents with  . Allergic Reaction    HPI Kara Woods is a 83 y.o. female Who presents today  With rash on her right chin, feeling weak like her "legs are cooked noodles" and feeling lightheaded.  She has a headache that started yesterday along with general malaise.  No fevers or chills.  She took 500mg  APAP this morning with out relief of her headache.  She denies any history of stroke or previous leg weakness, says she has a bakers cyst in her left knee. She does not have any back pain, no numbness or tingling of legs or genitals.  No changes to bowel or bladder function.    She reports that her rash started out as a white head a few days ago that burst and now has a weeping red non painful rash to her chin.  She says she put neomycin on it but thinks she is allergic to it.  It has been gradually worsening. She has not been able to make it better.  There is associated swelling along lateral jaw.   HPI  Past Medical History:  Diagnosis Date  . Anxiety   . Arthritis   . Blind left eye   . Breast disorder    cancer left  . Cancer ([place])    left breast   . Colitis   . Coronary artery disease    a. 10/2016 Staged PCI: RCA 50p, 76m (2.25x12 Resolute Onyx DES), LCX 50p, 77m (2.75x23 Xience Alpine DES). Residual LAD 50p/m, 77m/d.  Marland Kitchen Cystocele 10/11/2013  . Depression   . Diastolic dysfunction    a. 09/2016 Echo: EF 50%, diffuse hypokinesis, mild LVH, grade 1 diastolic dysfunction, mild mitral regurgitation, mildly dilated left atrium.  Marland Kitchen GERD (gastroesophageal reflux disease)    occasional Tums only  . HOH (hard of hearing)   . Pelvic relaxation 10/11/2013  . Proctitis    colonsocopy 2007, Canasa suppositories  . S/P endoscopy March 2009   mild erosive reflux esophagitis, Schatzki's ring, s/p dilation  . Schatzki's ring   .  Shortness of breath    occ if anxiety  . Wears dentures    upper  . Wears glasses     Patient Active Problem List   Diagnosis Date Noted  . Dizziness 11/19/2016  . CAD (coronary artery disease) 11/06/2016  . Essential hypertension 11/06/2016  . Unstable angina ([place]) 11/04/2016  . S/P vaginal hysterectomy 05/17/2014  . Cystocele 10/11/2013  . Pelvic relaxation 10/11/2013  . Cancer of left breast ([place]) 04/20/2013  . Diarrhea 06/23/2012  . Schatzki's ring 07/16/2011  . COLLES' FRACTURE, LEFT 04/04/2010  . CHEST PAIN UNSPECIFIED 09/18/2009  . HYPERLIPIDEMIA 07/28/2008  . ANXIETY 07/28/2008  . MACULAR DEGENERATION, BILATERAL 07/28/2008  . CAD 07/28/2008  . GERD 07/28/2008  . PROCTITIS 07/28/2008  . OSTEOPENIA 07/28/2008  . RETINAL DETACHMENT, LEFT EYE, HX OF 07/28/2008    Past Surgical History:  Procedure Laterality Date  . ANTERIOR AND POSTERIOR REPAIR N/A 05/17/2014   Procedure: ANTERIOR (CYSTOCELE) AND POSTERIOR REPAIR (RECTOCELE);  Surgeon: Reece Packer, MD;  Location: [facility];  Service: Urology;  Laterality: N/A;  . BREAST LUMPECTOMY WITH NEEDLE LOCALIZATION AND AXILLARY SENTINEL LYMPH NODE BX Left 05/03/2013   Procedure: BREAST LUMPECTOMY WITH NEEDLE LOCALIZATION AND AXILLARY SENTINEL LYMPH NODE BX;  Surgeon: Edward Jolly, MD;  Location:  [facility];  Service: General;  Laterality: Left;  . BREAST SURGERY Left 05/19/13  . CARDIAC CATHETERIZATION  1998  . CARDIAC CATHETERIZATION N/A 11/04/2016   Procedure: Left Heart Cath and Coronary Angiography;  Surgeon: Troy Sine, MD;  Location: [facility];  Service: Cardiovascular;  Laterality: N/A;  . CARDIAC CATHETERIZATION N/A 11/05/2016   Procedure: Coronary Stent Intervention;  Surgeon: Troy Sine, MD;  Location: [facility];  Service: Cardiovascular;  Laterality: N/A;  . COLONOSCOPY  05/06/2006   Diffuse inflammatory changes of the rectal mucosa, consistent  with proctitis.   Otherwise, normal colon to terminal ileum  . CORONARY ANGIOPLASTY  11/04/2016  . CORONARY STENT PLACEMENT     Drug-eluting coronary artery stent, non-bioabsorbable-polymer-coated  . CYSTOSCOPY N/A 05/17/2014   Procedure: CYSTOSCOPY;  Surgeon: Reece Packer, MD;  Location: [facility];  Service: Urology;  Laterality: N/A;  . ESOPHAGOGASTRODUODENOSCOPY  02/09/2008   Schatzki ring status post dilation/Distal esophageal erosion consistent with mild erosive reflux  esophagitis, otherwise unremarkable esophagus, normal stomach, D1, D2.  . EYE SURGERY     lt cataract-implant  . EYE SURGERY     lt-lens repaced,l  . [place] SURGERY  1993  . RE-EXCISION OF BREAST LUMPECTOMY Left 05/19/2013   Procedure: RE-EXCISION OF BREAST LUMPECTOMY;  Surgeon: Edward Jolly, MD;  Location: [facility];  Service: General;  Laterality: Left;  . RETINAL DETACHMENT SURGERY  1978   Left  . VAGINAL HYSTERECTOMY Bilateral 05/17/2014   Procedure: HYSTERECTOMY VAGINAL with Bilateral Salpingo-Oophorectomy; Bladder cystotomy repair;  Surgeon: Marvene Staff, MD;  Location: [facility];  Service: Gynecology;  Laterality: Bilateral;  . VAGINAL PROLAPSE REPAIR N/A 05/17/2014   Procedure: VAGINAL VAULT PROLAPSE AND GRAFT;  Surgeon: Reece Packer, MD;  Location: [facility];  Service: Urology;  Laterality: N/A;    OB History    Gravida Para Term Preterm AB Living   6 6       5    SAB TAB Ectopic Multiple Live Births           6       Home Medications    Prior to Admission medications   Medication Sig Start Date End Date Taking? Authorizing Provider  acetaminophen (TYLENOL) 500 MG tablet Take 500-1,000 mg by mouth every 6 (six) hours as needed for mild pain.   Yes [provider]  aspirin EC 81 MG tablet Take 1 tablet (81 mg total) by mouth daily. 10/31/16  Yes Troy Sine, MD  carboxymethylcellulose (REFRESH PLUS) 0.5 % SOLN Place 3-4 drops into both eyes 3 (three) times daily as needed  (dry eyes). Preservative free   Yes [provider]  ezetimibe (ZETIA) 10 MG tablet Take 1 tablet (10 mg total) by mouth daily. 02/12/17 06/24/17 Yes Troy Sine, MD  metoprolol tartrate (LOPRESSOR) 25 MG tablet Take 0.5 tablets (12.5 mg total) by mouth 2 (two) times daily. 10/31/16 06/24/17 Yes Troy Sine, MD  nitroGLYCERIN (NITROSTAT) 0.4 MG SL tablet Place 1 tablet (0.4 mg total) under the tongue every 5 (five) minutes as needed. 11/06/16  Yes Cheryln Manly, NP  ticagrelor (BRILINTA) 90 MG TABS tablet Take 1 tablet (90 mg total) by mouth 2 (two) times daily. 11/06/16  Yes Reino Bellis B, NP  Travoprost, BAK Free, (TRAVATAN) 0.004 % SOLN ophthalmic solution Place 1 drop into the right eye at bedtime.    Yes [provider]  cephALEXin (KEFLEX) 500 MG  capsule Take 1 capsule (500 mg total) by mouth 4 (four) times daily. 06/24/17 07/01/17  Lorin Glass, PA-C  clorazepate (TRANXENE) 15 MG tablet Take 7.5 mg by mouth 2 (two) times daily as needed for anxiety. 10/25/16   [provider]    Family History Family History  Problem Relation Age of Onset  . Cancer Brother        spinal  . Colon cancer Neg Hx     Social History Social History  Substance Use Topics  . Smoking status: Former Smoker    Packs/day: 1.00    Types: Cigarettes    Quit date: 04/28/1990  . Smokeless tobacco: Never Used     Comment: quit 19 yrs ago  . Alcohol use Yes     Comment: occ     Allergies   Contrast media [iodinated diagnostic agents]; Lipitor [atorvastatin]; and Sulfonamide derivatives   Review of Systems Review of Systems  Constitutional: Negative for chills and fever.       Malaise  HENT: Positive for mouth sores (Right chin.). Negative for ear pain and sore throat.   Eyes: Negative for pain and visual disturbance.  Respiratory: Negative for cough and shortness of breath.   Cardiovascular: Negative for chest pain and palpitations.  Gastrointestinal:  Negative for abdominal pain and vomiting.  Genitourinary: Negative for dysuria and hematuria.  Musculoskeletal: Negative for arthralgias, back pain, neck pain and neck stiffness.  Skin: Negative for color change and rash.  Neurological: Positive for weakness and light-headedness. Negative for seizures and syncope.  All other systems reviewed and are negative.    Physical Exam Updated Vital Signs BP 134/77   Pulse 64   Temp 97.8 F (36.6 C) (Oral)   Resp 18   Ht 5\' 6"  (1.676 m)   Wt 72.6 kg (160 lb)   SpO2 97%   BMI 25.82 kg/m   Physical Exam  Constitutional: She is oriented to person, place, and time. She appears well-developed and well-nourished. No distress.  HENT:  Head: Normocephalic and atraumatic.  Right Ear: Tympanic membrane, external ear and ear canal normal.  Left Ear: Tympanic membrane, external ear and ear canal normal.  Nose: Nose normal.  Mouth/Throat: Uvula is midline, oropharynx is clear and moist and mucous membranes are normal. No uvula swelling.  No elevation to floor of mouth.   Eyes: Conjunctivae and EOM are normal.  Left eye cloudy, pupil is mostly obscured.   Neck: Normal range of motion. Neck supple. No JVD present.  Cardiovascular: Normal rate, regular rhythm, normal heart sounds and intact distal pulses.   No murmur heard. Pulmonary/Chest: Effort normal and breath sounds normal. No respiratory distress.  Abdominal: Soft. She exhibits no distension. There is no tenderness.  Musculoskeletal: Normal range of motion. She exhibits no edema, tenderness or deformity.  Lymphadenopathy:    She has no cervical adenopathy.  Neurological: She is alert and oriented to person, place, and time.  Mental Status:  Alert, oriented, thought content appropriate, able to give a coherent history. Speech fluent without evidence of aphasia. Able to follow 2 step commands without difficulty.  Cranial Nerves:  II:  Peripheral visual fields grossly normal in right eye, able  to see light/dark and "blobs of color" in left eye (normal for her), pupils equal, round, reactive to light III,IV, VI: ptosis not present, extra-ocular motions intact bilaterally  V,VII: smile symmetric, facial light touch sensation equal VIII: hearing grossly normal to voice  X: uvula elevates symmetrically  XI: bilateral shoulder  shrug symmetric and strong XII: midline tongue extension without fassiculations Motor:  Normal tone. 5/5 in upper extremities bilaterally including strong and equal grip strength.  5/5 strength in right lower extremity with flexion/extension at knee, ankle and hip.  4+/5 strength in left lower extremity to flexion/extension at hip, knee and ankle.   Cerebellar: normal finger-to-nose with bilateral upper extremities CV: distal pulses palpable throughout    Skin: Skin is warm and dry.  2cm rash with honey colored crusts to right chin, mild area of swelling/induration lateral to rash.   Psychiatric: She has a normal mood and affect. Her behavior is normal.  Nursing note and vitals reviewed.    ED Treatments / Results  Labs (all labs ordered are listed, but only abnormal results are displayed) Labs Reviewed  PROTIME-INR - Abnormal; Notable for the following:       Result Value   Prothrombin Time 11.3 (*)    All other components within normal limits  APTT - Abnormal; Notable for the following:    aPTT 21 (*)    All other components within normal limits  URINALYSIS, ROUTINE W REFLEX MICROSCOPIC - Abnormal; Notable for the following:    APPearance HAZY (*)    Leukocytes, UA TRACE (*)    Bacteria, UA RARE (*)    Squamous Epithelial / LPF 0-5 (*)    All other components within normal limits  CBC WITH DIFFERENTIAL/PLATELET  COMPREHENSIVE METABOLIC PANEL  I-STAT TROPONIN, ED    EKG  EKG Interpretation  Date/Time:  Tuesday June 24 2017 10:20:55 EDT Ventricular Rate:  65 PR Interval:    QRS Duration: 93 QT Interval:  442 QTC Calculation: 460 R  Axis:   69 Text Interpretation:  Sinus rhythm Low voltage, precordial leads No acute changes No significant change since last tracing Confirmed by Varney Biles [phone]) on 06/24/2017 11:06:24 AM       Radiology Ct Head Wo Contrast  Result Date: 06/24/2017 CLINICAL DATA:  Weakness, headache. EXAM: CT HEAD WITHOUT CONTRAST TECHNIQUE: Contiguous axial images were obtained from the base of the skull through the vertex without intravenous contrast. COMPARISON:  04/04/2009 FINDINGS: Brain: Mild age related volume loss. No acute intracranial abnormality. Specifically, no hemorrhage, hydrocephalus, mass lesion, acute infarction, or significant intracranial injury. Vascular: No hyperdense vessel or unexpected calcification. Skull: No acute calvarial abnormality. Sinuses/Orbits: Visualized paranasal sinuses and mastoids clear. Orbital soft tissues unremarkable. Other: None IMPRESSION: No acute intracranial abnormality. Electronically Signed   By: Rolm Baptise M.D.   On: 06/24/2017 10:49    Procedures Procedures (including critical care time)  Medications Ordered in ED Medications - No data to display   Initial Impression / Assessment and Plan / ED Course  I have reviewed the triage vital signs and the nursing notes.  Pertinent labs & imaging results that were available during my care of the patient were reviewed by me and considered in my medical decision making (see chart for details).    Kara Woods presents with rash on her right chin with honey colored crusts consistent with impetigo.  Based on associated swelling and mild induration will treat with PO Keflex instead of topicals.    On initial physical exam she had slight weakness in her left lower leg, most noticeable with hip flexion/extension.  Given objective weakness along with reportedly feeling like her legs are "cooked noodles" a stroke work up was obtained, however a code stroke was not called as patient has been weak since she  woke up this  morning with a headache since last night.  Labs were obtained and were reassuring.  MRI was ordered, however patient reports she has been told she can't have a MRI based on amount of metal in her mouth.  On repeat physical exam patient appeared to have normal strength, is convinced her earlier symptoms were from anxiety and over-thinking. Patient was able to walk with out difficulty.  Daughter adds that patient does tend to over think things.  At time of discharge patient is resting comfortably, in no distress.  Patient and daughter were given strict return precautions and instructed to follow up with PCP regarding symptoms today.     At this time there does not appear to be any evidence of an acute emergency medical condition and the patient appears stable for discharge with appropriate outpatient follow up.Diagnosis was discussed with patient who verbalizes understanding and is agreeable to discharge. Pt case discussed with Dr. Kathrynn Humble who agrees with my plan.     Final Clinical Impressions(s) / ED Diagnoses   Final diagnoses:  Impetigo  Nonintractable headache, unspecified chronicity pattern, unspecified headache type    New Prescriptions Discharge Medication List as of 06/24/2017 11:37 AM    START taking these medications   Details  cephALEXin (KEFLEX) 500 MG capsule Take 1 capsule (500 mg total) by mouth 4 (four) times daily., Starting Tue 06/24/2017, Until Tue 07/01/2017, Print         Lorin Glass, PA-C 06/24/17 [place], Ankit, MD 06/24/17 [phone]

## 2017-06-24 NOTE — ED Notes (Signed)
Pt ambulating in the hall with no assistance

## 2017-06-24 NOTE — ED Triage Notes (Addendum)
Pt reports was out of town last week and had a bump on r side of chin.  Reports bump initially had a white head that burst.  Pt's daughter gave her some antibiotic ointment that had neomycin in it and thinks is allergic to the ointment.    Area is red and swollen.  Pt says she "doesn't feel good."  Pt says area isn't painful, but swollen.  Pt also co feeling weak, dizzy, and headache since yesterday.  Area is weeping.

## 2017-06-24 NOTE — Discharge Instructions (Signed)
All antibiotics may cause diarrhea.  Please make sure to stay well hydrated.  Please continue to take all your antibiotics even if your infection gets better and/or you have diarrhea.  Failure to take all the antibiotic leads to the creation of stronger, more resistant bacteria that may need stronger antibiotics to treat.

## 2017-06-27 ENCOUNTER — Ambulatory Visit: Payer: Medicare Other | Admitting: Family Medicine

## 2017-07-01 DIAGNOSIS — L0100 Impetigo, unspecified: Secondary | ICD-10-CM | POA: Diagnosis not present

## 2017-07-01 DIAGNOSIS — Z712 Person consulting for explanation of examination or test findings: Secondary | ICD-10-CM | POA: Diagnosis not present

## 2017-07-04 ENCOUNTER — Encounter: Payer: Self-pay | Admitting: Gastroenterology

## 2017-07-10 ENCOUNTER — Ambulatory Visit: Payer: Medicare Other | Admitting: Internal Medicine

## 2017-07-14 ENCOUNTER — Telehealth: Payer: Self-pay

## 2017-07-14 NOTE — Telephone Encounter (Signed)
Left message for daughter Armida Sans # [phone] to call us back and R/S mom's appointment.  Also called [facility] to inform them since they referred her here, spoke with Bethena Roys in medical records that does the outside referrals.

## 2017-08-13 DIAGNOSIS — B955 Unspecified streptococcus as the cause of diseases classified elsewhere: Secondary | ICD-10-CM | POA: Diagnosis not present

## 2017-08-13 DIAGNOSIS — Z6825 Body mass index (BMI) 25.0-25.9, adult: Secondary | ICD-10-CM | POA: Diagnosis not present

## 2017-09-02 DIAGNOSIS — Z23 Encounter for immunization: Secondary | ICD-10-CM | POA: Diagnosis not present

## 2017-09-06 ENCOUNTER — Other Ambulatory Visit: Payer: Self-pay | Admitting: Cardiovascular Disease

## 2017-09-08 NOTE — Telephone Encounter (Signed)
Rx(s) sent to pharmacy electronically.  

## 2017-09-18 DIAGNOSIS — J029 Acute pharyngitis, unspecified: Secondary | ICD-10-CM | POA: Diagnosis not present

## 2017-10-06 ENCOUNTER — Telehealth: Payer: Self-pay | Admitting: Cardiovascular Disease

## 2017-10-06 NOTE — Telephone Encounter (Signed)
Pt daughter calling to verify time of pts Lexi scheduled for this week. Time given and reminded to arrive 15 minutes before.  She also stated that pt has contrast allergy and wondered if she needed anything before hand. Educated that dyes used for this test are different than traditional contrast dyes and it should not be an issue. Also reassured that we have her allergies listed, verified with her, and that is there is an issue the tech will ask before starting test.  Verbalized understanding no additional questions at this time.

## 2017-10-06 NOTE — Telephone Encounter (Signed)
New Message     Pt Daughter Georga Hacking has questions about the myocardial perfusion her mother is having please call

## 2017-10-07 ENCOUNTER — Telehealth (HOSPITAL_COMMUNITY): Payer: Self-pay

## 2017-10-07 NOTE — Telephone Encounter (Signed)
Encounter complete. 

## 2017-10-09 ENCOUNTER — Ambulatory Visit (HOSPITAL_COMMUNITY)
Admission: RE | Admit: 2017-10-09 | Discharge: 2017-10-09 | Disposition: A | Discharge status: Home / Self Care | Payer: Medicare Other | Source: Ambulatory Visit | Attending: Cardiovascular Disease | Admitting: Cardiovascular Disease

## 2017-10-09 DIAGNOSIS — I10 Essential (primary) hypertension: Secondary | ICD-10-CM | POA: Diagnosis not present

## 2017-10-09 DIAGNOSIS — I2510 Atherosclerotic heart disease of native coronary artery without angina pectoris: Secondary | ICD-10-CM | POA: Diagnosis not present

## 2017-10-09 LAB — MYOCARDIAL PERFUSION IMAGING
LV dias vol: 72 mL (ref 46–106)
LV sys vol: 35 mL
Peak HR: 95 {beats}/min
Rest HR: 65 {beats}/min
SDS: 1
SRS: 0
SSS: 1
TID: 1.02

## 2017-10-09 IMAGING — NM NM MISC PROCEDURE
9 series · 54 of 54 positions shown · non-contrast
Comparison: none

[Series 1: wbr rest · 6.40mm/px · 6 of 64 frames shown]
[frame 6/64]
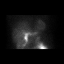
[frame 16/64]
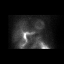
[frame 27/64]
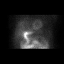
[frame 38/64]
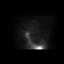
[frame 48/64]
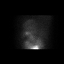
[frame 59/64]
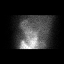

[Series 1: rest sax · 6.4mm · 6.40mm/px · 6 of 20 frames shown]
[frame 2/20]
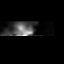
[frame 5/20]
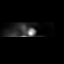
[frame 9/20]
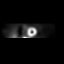
[frame 12/20]
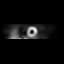
[frame 15/20]
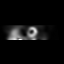
[frame 19/20]
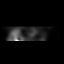

[Series 1: wbr_r-proj_st wbr rest · 6.40mm/px · 6 of 64 frames shown]
[frame 6/64]
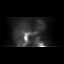
[frame 16/64]
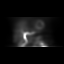
[frame 27/64]
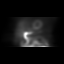
[frame 38/64]
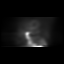
[frame 48/64]
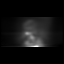
[frame 59/64]
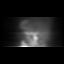

[Series 2: wbr stress-gsp · 6.40mm/px · 6 of 512 frames shown]
[frame 43/512  full-range]
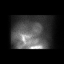
[frame 128/512  full-range]
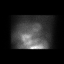
[frame 214/512  full-range]
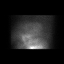
[frame 299/512  full-range]
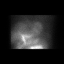
[frame 384/512  full-range]
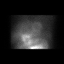
[frame 470/512  full-range]
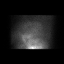

[Series 2: stress sax · 6.4mm · 6.40mm/px · 6 of 20 frames shown]
[frame 2/20]
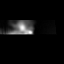
[frame 5/20]
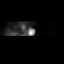
[frame 9/20]
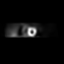
[frame 12/20]
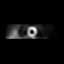
[frame 15/20]
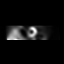
[frame 19/20]
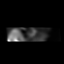

[Series 2: stress sax gs · 6.4mm · 6.40mm/px · 6 of 160 frames shown]
[frame 14/160]
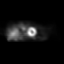
[frame 40/160]
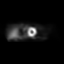
[frame 67/160]
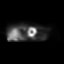
[frame 94/160]
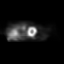
[frame 120/160]
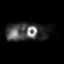
[frame 147/160]
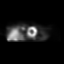

[Series 2: wbr_s-proj_st wbr stress-gsp · 6.40mm/px · 6 of 512 frames shown]
[frame 43/512]
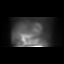
[frame 128/512]
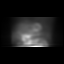
[frame 214/512]
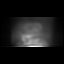
[frame 299/512]
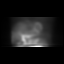
[frame 384/512]
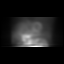
[frame 470/512]
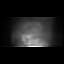

[Series 3: wbr stress-sum-em · 6.40mm/px · 6 of 64 frames shown]
[frame 6/64]
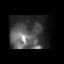
[frame 16/64]
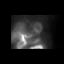
[frame 27/64]
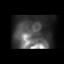
[frame 38/64]
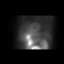
[frame 48/64]
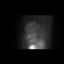
[frame 59/64]
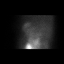

[Series 3: wbr_s-proj_st wbr stress-sum-em · 6.40mm/px · 6 of 64 frames shown]
[frame 6/64]
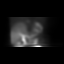
[frame 16/64]
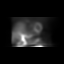
[frame 27/64]
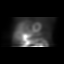
[frame 38/64]
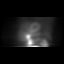
[frame 48/64]
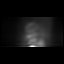
[frame 59/64]
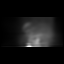

[54 of 54 positions shown; findings below may reference images not displayed]

Canned report from images found in remote index.

Refer to host system for actual result text.

## 2017-10-09 MED ORDER — AMINOPHYLLINE 25 MG/ML IV SOLN
75.0000 mg | Freq: Once | INTRAVENOUS | Status: AC
  Administered 2017-10-09: 75 mg via INTRAVENOUS

## 2017-10-09 MED ORDER — REGADENOSON 0.4 MG/5ML IV SOLN
0.4000 mg | Freq: Once | INTRAVENOUS | Status: AC
  Administered 2017-10-09: 0.4 mg via INTRAVENOUS

## 2017-10-09 MED ORDER — TECHNETIUM TC 99M TETROFOSMIN IV KIT
31.7000 | PACK | Freq: Once | INTRAVENOUS | Status: AC | PRN
  Administered 2017-10-09: 31.7 via INTRAVENOUS
  Filled 2017-10-09: qty 32

## 2017-10-09 MED ORDER — TECHNETIUM TC 99M TETROFOSMIN IV KIT
10.5000 | PACK | Freq: Once | INTRAVENOUS | Status: AC | PRN
  Administered 2017-10-09: 10.5 via INTRAVENOUS
  Filled 2017-10-09: qty 11

## 2017-10-22 DIAGNOSIS — F5089 Other specified eating disorder: Secondary | ICD-10-CM | POA: Diagnosis not present

## 2017-10-22 DIAGNOSIS — K219 Gastro-esophageal reflux disease without esophagitis: Secondary | ICD-10-CM | POA: Diagnosis not present

## 2017-10-22 DIAGNOSIS — E782 Mixed hyperlipidemia: Secondary | ICD-10-CM | POA: Diagnosis not present

## 2017-10-22 DIAGNOSIS — I10 Essential (primary) hypertension: Secondary | ICD-10-CM | POA: Diagnosis not present

## 2017-10-22 DIAGNOSIS — F411 Generalized anxiety disorder: Secondary | ICD-10-CM | POA: Diagnosis not present

## 2017-10-22 DIAGNOSIS — I2510 Atherosclerotic heart disease of native coronary artery without angina pectoris: Secondary | ICD-10-CM | POA: Diagnosis not present

## 2017-10-30 ENCOUNTER — Encounter: Payer: Self-pay | Admitting: Cardiovascular Disease

## 2017-10-30 ENCOUNTER — Ambulatory Visit (INDEPENDENT_AMBULATORY_CARE_PROVIDER_SITE_OTHER): Payer: Medicare Other | Admitting: Cardiovascular Disease

## 2017-10-30 VITALS — BP 128/74 | HR 77 | Ht 66.5 in | Wt 160.0 lb

## 2017-10-30 DIAGNOSIS — I2510 Atherosclerotic heart disease of native coronary artery without angina pectoris: Secondary | ICD-10-CM

## 2017-10-30 DIAGNOSIS — E785 Hyperlipidemia, unspecified: Secondary | ICD-10-CM | POA: Diagnosis not present

## 2017-10-30 DIAGNOSIS — I10 Essential (primary) hypertension: Secondary | ICD-10-CM | POA: Diagnosis not present

## 2017-10-30 DIAGNOSIS — Z79899 Other long term (current) drug therapy: Secondary | ICD-10-CM | POA: Diagnosis not present

## 2017-10-30 MED ORDER — CLOPIDOGREL BISULFATE 75 MG PO TABS: 75.0000 mg | ORAL_TABLET | Freq: Every day | ORAL | 3 refills | Status: DC

## 2017-10-30 MED ORDER — PITAVASTATIN CALCIUM 1 MG PO TABS: 1.0000 mg | ORAL_TABLET | Freq: Every day | ORAL | 3 refills | Status: DC

## 2017-10-30 NOTE — Progress Notes (Signed)
Cardiology Office Note    Date:  10/30/2017   ID:  Kara Woods, DOB 07/11/1933, MRN [id number]  PCP:  Celene Squibb, MD  Referring M.D.: Dr. Velvet Bathe Cardiologist:  Shelva Majestic, MD   Chief Complaint  Patient presents with  . Follow-up    6 months  . Shortness of Breath    History of Present Illness:  Kara Woods is a 83 y.o. female who presents for a 6 month follow-up cardiology evaluation.  Kara Woods is a mother of my patient, who had suffered an out of hospital cardiac arrest years ago.  Kara Woods has a history of prior cardiac catheterization and from records [name] had undergone cardiac catheterization in 1998.  The patient states her coronaries were clean.  I do not have specifics of her catheterization findings.  Over the past 6 months, she has developed progressive decline in ability to be active and has noticed significant increasing exertionally precipitated shortness of breath.  She also has had some episodes of associated chest pressure with left arm radiation.  She had undergone an echo Doppler study on 10/24/2016 which showed an EF of 50% and suggested diffuse hypokinesis.  There was grade 1 diastolic dysfunction.  There was mild MR and mild LA dilatation.  She also had undergone pulmonary function testing was not significantly abnormal.  Her past history is notable for left breast CA and she is status post prior lumpectomy and radiation treatment.  She was cared for by Dr. Excell Seltzer.  She also is status post hysterectomy.  Remote history of cholecystectomy.  She has a history of retinal detachment.  When I saw her, due to progressive symptomatology with decline in exercise tolerance and increasing exertional dyspnea with a very strong family history of CAD.  Definitive cardiac catheterization was recommended.  This was performed by me on 11/04/2016 and revealed low normal global LV function with an EF of 50-55% with mild mid inferior focal hypocontractility.   There was multivessel CAD with multiple stenoses of 40-50% mid to distal LAD.  She had a normal ramus intermediate vessel.  There was 80% focal stenosis in the left circumflex and 95% eccentric mid RCA stenosis followed by 50% narrowing.  She underwent initial PCI to the mid RCA with insertion of a 2.2512 mm Resolute Onyx stent postdilated to 2.5 mm with the 95% stenoses being reduced to 0%.  The following day, on 11/05/2016 she underwent staged intervention to the left circumflex flex artery with the 50 and 85% stenoses being reduced to 0% with insertion of a 2.7523 mm  Xience Alpine DES stent postdilated to 3.0.  Since her intervention, she has felt significantly improved.  She had mild dyspnea more attributed to air hunger and this typically is relieved when she takes a deep sigh.   She was started on Lipitor but did not tolerate this.  Subsequently, I recommended initiation of Crestor at 5 mg 2 times a week.  She took 2 doses and then felt she had similar symptoms and stopped taking this.  She denies any exertional chest tightness.  She was diagnosed with a left knee Baker's cyst.  She denies palpitations.    Since I last saw her, she denies recurrent anginal symptoms.  She underwent a one-year follow-up nuclear stress test which was done on 10/09/2017.  This remained low risk and showed an EF of 51% without scar or ischemia.  She is concerned that Zetia may be causing diarrhea.  She is  here with her daughter today and there is concern of the cost of Brilinta particular since she is in the "dough[place]."  She is unaware of palpitations.  She presents for cardiology reevaluation.  Past Medical History:  Diagnosis Date  . Anxiety   . Arthritis   . Blind left eye   . Breast cancer ([place])    left   . Colitis   . Coronary artery disease    a. 10/2016 Staged PCI: RCA 50p, 34m(2.25x12 Resolute Onyx DES), LCX 50p, 820m2.75x23 Xience Alpine DES). Residual LAD 50p/m, 4577m  . CMarland Kitchenstocele 10/11/2013  .  Depression   . Diastolic dysfunction    a. 09/2016 Echo: EF 50%, diffuse hypokinesis, mild LVH, grade 1 diastolic dysfunction, mild mitral regurgitation, mildly dilated left atrium.  . GMarland KitchenRD (gastroesophageal reflux disease)    occasional Tums only  . HOH (hard of hearing)   . Pelvic relaxation 10/11/2013  . Proctitis    colonsocopy 2007, Canasa suppositories  . S/P endoscopy March 2009   mild erosive reflux esophagitis, Schatzki's ring, s/p dilation  . Schatzki's ring   . Shortness of breath    occ if anxiety  . Wears dentures    upper  . Wears glasses     Past Surgical History:  Procedure Laterality Date  . ANTERIOR AND POSTERIOR REPAIR N/A 05/17/2014   Procedure: ANTERIOR (CYSTOCELE) AND POSTERIOR REPAIR (RECTOCELE);  Surgeon: ScoReece PackerD;  [facility];  Service: Urology;  Laterality: N/A;  . BREAST LUMPECTOMY WITH NEEDLE LOCALIZATION AND AXILLARY SENTINEL LYMPH NODE BX Left 05/03/2013   Procedure: BREAST LUMPECTOMY WITH NEEDLE LOCALIZATION AND AXILLARY SENTINEL LYMPH NODE BX;  Surgeon: BenEdward JollyD;  [facility];  Laterality: Left;  . BREAST SURGERY Left 05/19/13  . CARDIAC CATHETERIZATION  1998  . CARDIAC CATHETERIZATION N/A 11/04/2016   Procedure: Left Heart Cath and Coronary Angiography;  Surgeon: ThoTroy SineD;  [facility];  Service: Cardiovascular;  Laterality: N/A;  . CARDIAC CATHETERIZATION N/A 11/05/2016   Procedure: Coronary Stent Intervention;  Surgeon: ThoTroy SineD;  Location: [facility];  Service: Cardiovascular;  Laterality: N/A;  . COLONOSCOPY  05/06/2006   Diffuse inflammatory changes of the rectal mucosa, consistent  with proctitis.  Otherwise, normal colon to terminal ileum  . CORONARY ANGIOPLASTY  11/04/2016  . CORONARY STENT PLACEMENT     Drug-eluting coronary artery stent, non-bioabsorbable-polymer-coated  . CYSTOSCOPY N/A 05/17/2014   Procedure:  CYSTOSCOPY;  Surgeon: ScoReece PackerD;  [facility];  Service: Urology;  Laterality: N/A;  . ESOPHAGOGASTRODUODENOSCOPY  02/09/2008   Schatzki ring status post dilation/Distal esophageal erosion consistent with mild erosive reflux  esophagitis, otherwise unremarkable esophagus, normal stomach, D1, D2.  . EYE SURGERY     lt cataract-implant  . EYE SURGERY     lt-lens repaced,l  . LUMWetzelRGERY  1993  . RE-EXCISION OF BREAST LUMPECTOMY Left 05/19/2013   Procedure: RE-EXCISION OF BREAST LUMPECTOMY;  Surgeon: BenEdward JollyD;  [facility];  Laterality: Left;  . RETINAL DETACHMENT SURGERY  1978   Left  . VAGINAL HYSTERECTOMY Bilateral 05/17/2014   Procedure: HYSTERECTOMY VAGINAL with Bilateral Salpingo-Oophorectomy; Bladder cystotomy repair;  Surgeon: SheMarvene StaffD;  [facility];  Service: Gynecology;  Laterality: Bilateral;  . VAGINAL PROLAPSE REPAIR N/A 05/17/2014   Procedure: VAGINAL VAULT PROLAPSE AND GRAFT;  Surgeon: ScoElayne Snare  MacDiarmid, MD;  Location: [facility];  Service: Urology;  Laterality: N/A;    Current Medications: Outpatient Medications Prior to Visit  Medication Sig Dispense Refill  . acetaminophen (TYLENOL) 500 MG tablet Take 500-1,000 mg by mouth every 6 (six) hours as needed for mild pain.    Marland Kitchen aspirin EC 81 MG tablet Take 1 tablet (81 mg total) by mouth daily. 90 tablet 3  . carboxymethylcellulose (REFRESH PLUS) 0.5 % SOLN Place 3-4 drops into both eyes 3 (three) times daily as needed (dry eyes). Preservative free    . clorazepate (TRANXENE) 15 MG tablet Take 7.5 mg by mouth 2 (two) times daily as needed for anxiety.    Marland Kitchen ezetimibe (ZETIA) 10 MG tablet TAKE ONE TABLET BY MOUTH ONCE DAILY. 30 tablet 6  . nitroGLYCERIN (NITROSTAT) 0.4 MG SL tablet Place 1 tablet (0.4 mg total) under the tongue every 5 (five) minutes as needed. 25 tablet 3  . Travoprost, BAK Free, (TRAVATAN) 0.004 % SOLN ophthalmic  solution Place 1 drop into the right eye at bedtime.     . ticagrelor (BRILINTA) 90 MG TABS tablet Take 1 tablet (90 mg total) by mouth 2 (two) times daily. 60 tablet 0  . metoprolol tartrate (LOPRESSOR) 25 MG tablet Take 0.5 tablets (12.5 mg total) by mouth 2 (two) times daily. 180 tablet 3   No facility-administered medications prior to visit.      Allergies:   Contrast media [iodinated diagnostic agents]; Lipitor [atorvastatin]; and Sulfonamide derivatives   Social History   Socioeconomic History  . Marital status: Widowed    Spouse name: None  . Number of children: None  . Years of education: None  . Highest education level: None  Social Needs  . Financial resource strain: None  . Food insecurity - worry: None  . Food insecurity - inability: None  . Transportation needs - medical: None  . Transportation needs - non-medical: None  Occupational History  . Occupation: Retired  Tobacco Use  . Smoking status: Former Smoker    Packs/day: 1.00    Types: Cigarettes    Last attempt to quit: 04/28/1990    Years since quitting: 27.5  . Smokeless tobacco: Never Used  . Tobacco comment: quit 19 yrs ago  Substance and Sexual Activity  . Alcohol use: Yes    Comment: occ  . Drug use: No  . Sexual activity: Not Currently    Birth control/protection: Post-menopausal  Other Topics Concern  . None  Social History Narrative  . None     Family History:  The patient's family history includes Cancer in her brother; Multiple myeloma in her daughter.  I mother died and had a myocardial infarction.  Her father also had suffered a myocardial infarction.  She had 3 sisters who also had myocardial infarction and also one of her brothers also had suffered a myocardial infarction.  ROS General: Negative; No fevers, chills, or night sweats;  HEENT: History of retinal detachment, sinus congestion, difficulty swallowing Pulmonary: Negative; No cough, wheezing, shortness of breath,  hemoptysis Cardiovascular: see HPI GI: Positive for occasional loose stools GU: Negative; No dysuria, hematuria, or difficulty voiding Musculoskeletal: Positive for left Baker's cyst Hematologic/Oncology: Negative; no easy bruising, bleeding Endocrine: Negative; no heat/cold intolerance; no diabetes Neuro: Negative; no changes in balance, headaches Skin: Negative; No rashes or skin lesions Psychiatric: Negative; No behavioral problems, depression Sleep: Negative; No snoring, daytime sleepiness, hypersomnolence, bruxism, restless legs, hypnogognic hallucinations, no cataplexy Other comprehensive 14 point system review is negative.  PHYSICAL EXAM:   VS:  BP 128/74   Pulse 77   Ht 5' 6.5" (1.689 m)   Wt 160 lb (72.6 kg)   BMI 25.44 kg/m     Repeat blood work 134/76  Wt Readings from Last 3 Encounters:  10/30/17 160 lb (72.6 kg)  10/09/17 160 lb (72.6 kg)  06/24/17 160 lb (72.6 kg)    General: Alert, oriented, no distress.  Skin: normal turgor, no rashes, warm and dry HEENT: Normocephalic, atraumatic. Pupils equal round and reactive to light; sclera anicteric; extraocular muscles intact;  Nose without nasal septal hypertrophy Mouth/Parynx benign; Mallinpatti scale 3 Neck: No JVD, no carotid bruits; normal carotid upstroke Lungs: clear to ausculatation and percussion; no wheezing or rales Chest wall: without tenderness to palpitation Heart: PMI not displaced, RRR, s1 s2 normal, 1/6 systolic murmur, no diastolic murmur, no rubs, gallops, thrills, or heaves Abdomen: soft, nontender; no hepatosplenomehaly, BS+; abdominal aorta nontender and not dilated by palpation. Back: no CVA tenderness Pulses 2+ Musculoskeletal: full range of motion, normal strength, no joint deformities Extremities: no clubbing cyanosis or edema, Homan's sign negative  Neurologic: grossly nonfocal; Cranial nerves grossly wnl Psychologic: Normal mood and affect   Studies/Labs Reviewed:   ECG  (independently read by me): Normal sinus rhythm at 77 bpm.  Nonspecific ST changes.  June 2018 ECG (independently read by me): Sinus bradycardia 56 bpm.  Normal intervals.  No significant ST-T changes.  March 2018 ECG (independently read by me): Sinus bradycardia 54 bpm.  Normal intervals.  No ST segment changes.  December 2017 ECG (independently read by me): Normal sinus rhythm at 74 bpm.  No ectopy.  QTc interval 455 ms.  Recent Labs: BMP Latest Ref Rng & Units 06/24/2017 11/19/2016 11/11/2016  Glucose 65 - 99 mg/dL 96 95 90  BUN 6 - 20 mg/dL 12 12 11   Creatinine 0.44 - 1.00 mg/dL 0.77 0.85 0.72  Sodium 135 - 145 mmol/L 140 139 141  Potassium 3.5 - 5.1 mmol/L 4.8 4.6 4.5  Chloride 101 - 111 mmol/L 106 105 106  CO2 22 - 32 mmol/L 28 25 23   Calcium 8.9 - 10.3 mg/dL 8.9 9.1 9.0     Hepatic Function Latest Ref Rng & Units 06/24/2017 11/19/2016 10/31/2016  Total Protein 6.5 - 8.1 g/dL 7.2 6.8 7.1  Albumin 3.5 - 5.0 g/dL 3.7 3.7 4.3  AST 15 - 41 U/L 24 19 17   ALT 14 - 54 U/L 15 14 10   Alk Phosphatase 38 - 126 U/L 58 57 55  Total Bilirubin 0.3 - 1.2 mg/dL 0.7 0.6 0.4  Bilirubin, Direct 0.1 - 0.5 mg/dL - 0.1 -    CBC Latest Ref Rng & Units 06/24/2017 11/19/2016 11/06/2016  WBC 4.0 - 10.5 K/uL 8.0 9.1 13.6(H)  Hemoglobin 12.0 - 15.0 g/dL 12.4 12.7 12.2  Hematocrit 36.0 - 46.0 % 37.6 39.0 36.0  Platelets 150 - 400 K/uL 269 236 250   Lab Results  Component Value Date   MCV 93.3 06/24/2017   MCV 93.8 11/19/2016   MCV 91.4 11/06/2016   No results found for: TSH No results found for: HGBA1C   BNP    Component Value Date/Time   BNP 51.0 09/01/2016 1012    ProBNP No results found for: PROBNP   Lipid Panel     Component Value Date/Time   CHOL 227 (H) 10/31/2016 1245   TRIG 164 (H) 10/31/2016 1245   HDL 47 (L) 10/31/2016 1245   CHOLHDL 4.8 10/31/2016 1245  VLDL 33 (H) 10/31/2016 1245   LDLCALC 147 (H) 10/31/2016 1245     RADIOLOGY: No results found.   Additional  studies/ records that were reviewed today include:   At her last office visit I reviewed the office records of [name].  I reviewed her echo Doppler study and pulmonary function tests.    ASSESSMENT:    1. Coronary artery disease involving native coronary artery of native heart without angina pectoris   2. Hyperlipidemia with target LDL less than 70   3. Medication management   4. Essential hypertension      PLAN:  Kara Woods is an 83 year old Caucasian female who had developed progressive decline in exercise tolerance with significant increasing shortness of breath with minimal activity associated with chest pressure and left arm radiation.  In 1998, reportedly a cardiac catheterization had shown normal coronary arteries.  An echo Doppler study from November 2017 showed an EF of 50% with diffuse hypokinesis.  There was mild MR and mild dilatation of her left atrium. Her pulmonary function studies did not identified significant pulmonary disability in the etiology of her dyspnea.  Her very strong family history of myocardial infarctions in multiple family members, as well as remote tobacco history and progressive changes in symptomatology.  Cardiac catheterization revealed severe multivessel CAD as noted above and she underwent successful stenting of her RCA as well as her left circumflex vessels in a staged procedure.  She has been without recurrent anginal symptomatology.  I reviewed her most recent nuclear perfusion study from 10/09/2017, which continue to show normal perfusion, without scar or ischemia.  EF was 51%.  She has been intolerant to atorvastatin as well as rosuvastatin.  She is on Zetia.  In the past, we discussed the possibility of Livalo as well as PCSK9 inhibition.  She never tried the Livalo.  She has no interest in pursuing PCSK9 inhibiion therapy.  In December 2017 her LDL was 147.  She had blood work done by Dr. Wende Neighbors on 10/22/2017 and her total cholesterol  remained elevated at 291.  Triglycerides were 165.  HDL was 49, but for some reason the LDL was not calculated.  I have recommended that she try Livalo 1 mg and will provide her with samples.  We discussed the addition of over-the-counter coenzyme Q10.  Once she completes one year of her DAPT  with aspirin/Brilinta due to cost I will try to change her to generic clopidogrel 75 mg and 1-2 weeks after initiating this therapy she will undergo a P2Y12 blood tests to make certain she is Plavix responsive.  Follow-up laboratory will be obtained in 3 months.   I will see her in 4 months for reevaluation.   Medication Adjustments/Labs and Tests Ordered: Current medicines are reviewed at length with the patient today.  Concerns regarding medicines are outlined above.  Medication changes, Labs and Tests ordered today are listed in the Patient Instructions below.  Patient Instructions  Medication Instructions:  Start Livalo 1 mg daily-samples provided.  When you run out of Brilinta, START Plavix 75 mg daily (prescription sent to pharmacy)  Labwork: After taking Plavix for 2 weeks- you will need lab work completed at the hospital (I will find out if this can be done at [facility], if not this will need to be done at [facility])  Please return for FASTING labs in 3 months (CMET, Lipid)  Follow-Up: Your physician wants you to follow-up in: 4 months with [name].  You will receive a reminder letter in the mail two months in advance. If you don't receive a letter, please call our office to schedule the follow-up appointment.   Any Other Special Instructions Will Be Listed Below (If Applicable).     If you need a refill on your cardiac medications before your next appointment, please call your pharmacy.      Signed, Shelva Majestic, MD, [facility] 10/30/2017 8:49 AM    [facility] [address], [place], [place], [place]  [address] Phone: [phone]

## 2017-10-30 NOTE — Patient Instructions (Signed)
Medication Instructions:  Start Livalo 1 mg daily-samples provided.  When you run out of Brilinta, START Plavix 75 mg daily (prescription sent to pharmacy)  Labwork: After taking Plavix for 2 weeks- you will need lab work completed at the hospital (I will find out if this can be done at [facility], if not this will need to be done at [facility])  Please return for FASTING labs in 3 months (CMET, Lipid)  Follow-Up: Your physician wants you to follow-up in: 4 months with [name].  You will receive a reminder letter in the mail two months in advance. If you don't receive a letter, please call our office to schedule the follow-up appointment.   Any Other Special Instructions Will Be Listed Below (If Applicable).     If you need a refill on your cardiac medications before your next appointment, please call your pharmacy.

## 2017-11-05 NOTE — Addendum Note (Signed)
Addended by: Venetia Maxon on: 11/05/2017 11:43 AM   Modules accepted: Orders

## 2017-11-07 ENCOUNTER — Telehealth: Payer: Self-pay | Admitting: Cardiovascular Disease

## 2017-11-07 MED ORDER — TICAGRELOR 90 MG PO TABS
90.0000 mg | ORAL_TABLET | Freq: Two times a day (BID) | ORAL | 3 refills | Status: DC
Start: 2017-11-07 — End: 2017-11-26

## 2017-11-07 NOTE — Telephone Encounter (Signed)
New message   Daughter is calling to report that the patient plans to stay on Brilinta. Does not want to take Plavix. Please call  Pt c/o medication issue:  1. Name of Medication: Brilinta  2. How are you currently taking this medication (dosage and times per day)? As prescribed  3. Are you having a reaction (difficulty breathing--STAT)?No 4. What is your medication issue?  No issue, would like to continue Brilinta, NOT Plavix

## 2017-11-07 NOTE — Telephone Encounter (Signed)
Left message for daughter (ok per DPR) to make aware message was received and med list to be updated.      Last OV with [name] 12/06 patient was going to start Plavix (stop Brilinta) due to cost.   Med list updated with previous dose of Brilinta.      Routed to [name] to make aware.

## 2017-11-09 NOTE — Telephone Encounter (Signed)
As per my office note, once Plavix and started, check P2Y12 blood tests in 1-2 weeks to make certain she is Plavix responsive.

## 2017-11-26 ENCOUNTER — Other Ambulatory Visit: Payer: Self-pay | Admitting: Cardiology

## 2017-11-26 NOTE — Telephone Encounter (Signed)
[facility] does not refill Brilinta.

## 2017-12-18 DIAGNOSIS — H401131 Primary open-angle glaucoma, bilateral, mild stage: Secondary | ICD-10-CM | POA: Diagnosis not present

## 2017-12-18 DIAGNOSIS — H5319 Other subjective visual disturbances: Secondary | ICD-10-CM | POA: Diagnosis not present

## 2017-12-18 DIAGNOSIS — H04123 Dry eye syndrome of bilateral lacrimal glands: Secondary | ICD-10-CM | POA: Diagnosis not present

## 2017-12-18 DIAGNOSIS — H1820 Unspecified corneal edema: Secondary | ICD-10-CM | POA: Diagnosis not present

## 2017-12-18 DIAGNOSIS — H524 Presbyopia: Secondary | ICD-10-CM | POA: Diagnosis not present

## 2017-12-18 DIAGNOSIS — H1851 Endothelial corneal dystrophy: Secondary | ICD-10-CM | POA: Diagnosis not present

## 2018-01-01 DIAGNOSIS — D045 Carcinoma in situ of skin of trunk: Secondary | ICD-10-CM | POA: Diagnosis not present

## 2018-01-01 DIAGNOSIS — L820 Inflamed seborrheic keratosis: Secondary | ICD-10-CM | POA: Diagnosis not present

## 2018-01-01 DIAGNOSIS — L308 Other specified dermatitis: Secondary | ICD-10-CM | POA: Diagnosis not present

## 2018-01-14 ENCOUNTER — Other Ambulatory Visit: Payer: Self-pay | Admitting: Cardiovascular Disease

## 2018-01-14 NOTE — Telephone Encounter (Signed)
REFILL 

## 2018-03-03 ENCOUNTER — Telehealth: Payer: Self-pay | Admitting: Cardiovascular Disease

## 2018-03-03 ENCOUNTER — Emergency Department (HOSPITAL_COMMUNITY)
Admission: EM | Admit: 2018-03-03 | Discharge: 2018-03-03 | Disposition: A | Discharge status: Home / Self Care | Payer: Medicare Other | Attending: Emergency Medicine | Admitting: Emergency Medicine

## 2018-03-03 ENCOUNTER — Emergency Department (HOSPITAL_COMMUNITY): Payer: Medicare Other

## 2018-03-03 ENCOUNTER — Encounter (HOSPITAL_COMMUNITY): Payer: Self-pay

## 2018-03-03 DIAGNOSIS — Z87891 Personal history of nicotine dependence: Secondary | ICD-10-CM | POA: Diagnosis not present

## 2018-03-03 DIAGNOSIS — R0602 Shortness of breath: Secondary | ICD-10-CM | POA: Diagnosis not present

## 2018-03-03 DIAGNOSIS — Z853 Personal history of malignant neoplasm of breast: Secondary | ICD-10-CM | POA: Diagnosis not present

## 2018-03-03 DIAGNOSIS — Z7982 Long term (current) use of aspirin: Secondary | ICD-10-CM | POA: Insufficient documentation

## 2018-03-03 DIAGNOSIS — I10 Essential (primary) hypertension: Secondary | ICD-10-CM | POA: Diagnosis not present

## 2018-03-03 DIAGNOSIS — Z955 Presence of coronary angioplasty implant and graft: Secondary | ICD-10-CM | POA: Insufficient documentation

## 2018-03-03 DIAGNOSIS — I2510 Atherosclerotic heart disease of native coronary artery without angina pectoris: Secondary | ICD-10-CM | POA: Diagnosis not present

## 2018-03-03 LAB — CBC WITH DIFFERENTIAL/PLATELET
Basophils Absolute: 0 10*3/uL (ref 0.0–0.1)
Basophils Relative: 0 %
Eosinophils Absolute: 0.1 10*3/uL (ref 0.0–0.7)
Eosinophils Relative: 1 %
HCT: 39.6 % (ref 36.0–46.0)
Hemoglobin: 12.9 g/dL (ref 12.0–15.0)
Lymphocytes Relative: 28 %
Lymphs Abs: 2.7 10*3/uL (ref 0.7–4.0)
MCH: 30.3 pg (ref 26.0–34.0)
MCHC: 32.6 g/dL (ref 30.0–36.0)
MCV: 93 fL (ref 78.0–100.0)
Monocytes Absolute: 0.4 10*3/uL (ref 0.1–1.0)
Monocytes Relative: 4 %
Neutro Abs: 6.7 10*3/uL (ref 1.7–7.7)
Neutrophils Relative %: 67 %
Platelets: 255 10*3/uL (ref 150–400)
RBC: 4.26 MIL/uL (ref 3.87–5.11)
RDW: 14.7 % (ref 11.5–15.5)
WBC: 9.9 10*3/uL (ref 4.0–10.5)

## 2018-03-03 LAB — BASIC METABOLIC PANEL
Anion gap: 9 (ref 5–15)
BUN: 8 mg/dL (ref 6–20)
CO2: 24 mmol/L (ref 22–32)
Calcium: 9.2 mg/dL (ref 8.9–10.3)
Chloride: 105 mmol/L (ref 101–111)
Creatinine, Ser: 0.79 mg/dL (ref 0.44–1.00)
GFR calc Af Amer: >60 mL/min (ref >60)
GFR calc non Af Amer: >60 mL/min (ref >60)
Glucose, Bld: 137 mg/dL — ABNORMAL HIGH (ref 65–99)
Potassium: 3.7 mmol/L (ref 3.5–5.1)
Sodium: 138 mmol/L (ref 135–145)

## 2018-03-03 LAB — I-STAT TROPONIN, ED
Troponin i, poc: 0 ng/mL (ref 0.00–0.08)
Troponin i, poc: 0 ng/mL (ref 0.00–0.08)

## 2018-03-03 LAB — HEPATIC FUNCTION PANEL
ALT: 11 U/L — ABNORMAL LOW (ref 14–54)
AST: 18 U/L (ref 15–41)
Albumin: 3.7 g/dL (ref 3.5–5.0)
Alkaline Phosphatase: 61 U/L (ref 38–126)
Bilirubin, Direct: <0.1 mg/dL — ABNORMAL LOW (ref 0.1–0.5)
Total Bilirubin: 0.6 mg/dL (ref 0.3–1.2)
Total Protein: 7.3 g/dL (ref 6.5–8.1)

## 2018-03-03 LAB — D-DIMER, QUANTITATIVE: D-Dimer, Quant: 0.63 ug/mL-FEU — ABNORMAL HIGH (ref 0.00–0.50)

## 2018-03-03 IMAGING — DX DG CHEST 2V
2 series · 2 of 2 positions shown · non-contrast
Comparison: [DATE]

CLINICAL DATA: Shortness of breath.

EXAM:
CHEST - 2 VIEW

[chest pa]
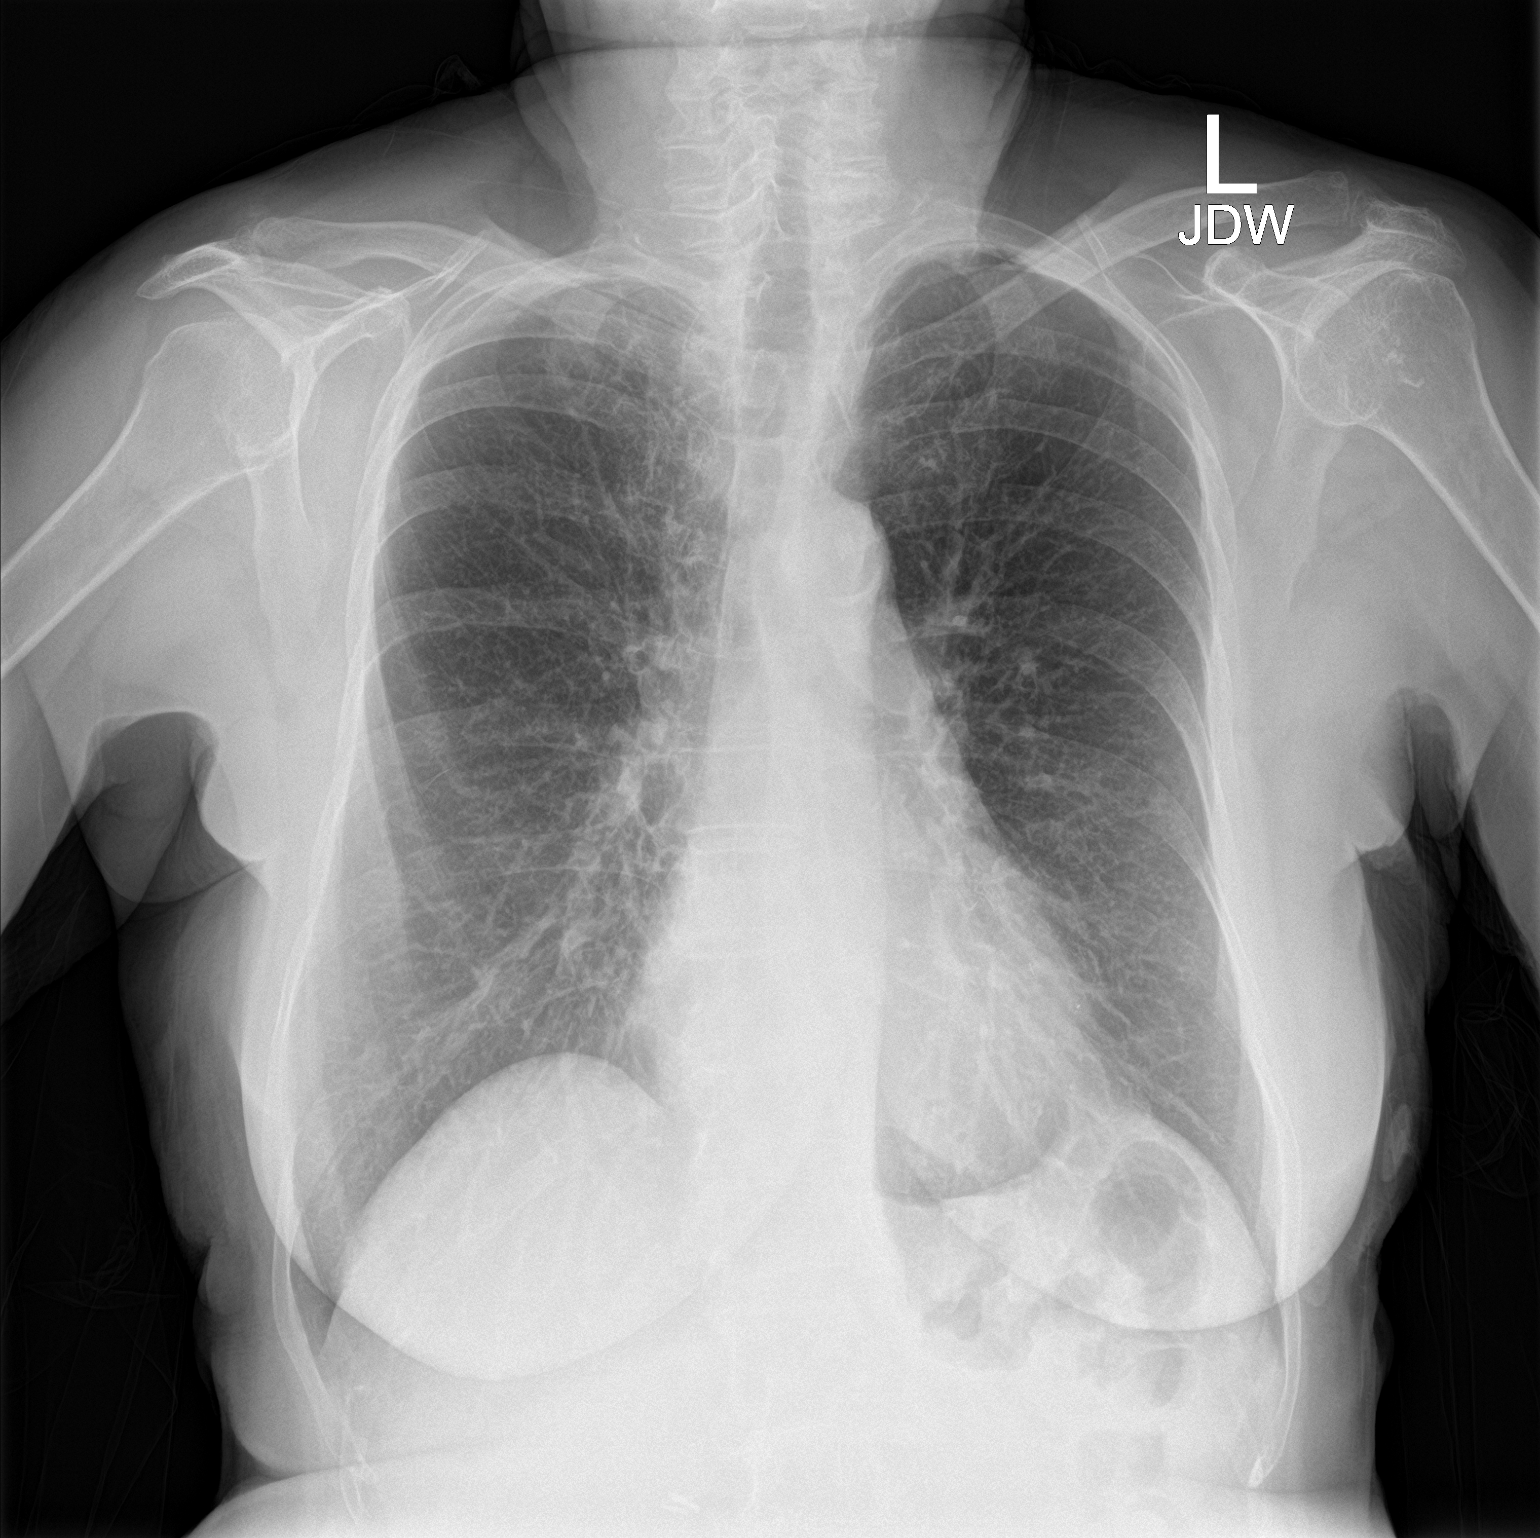

[chest lat]
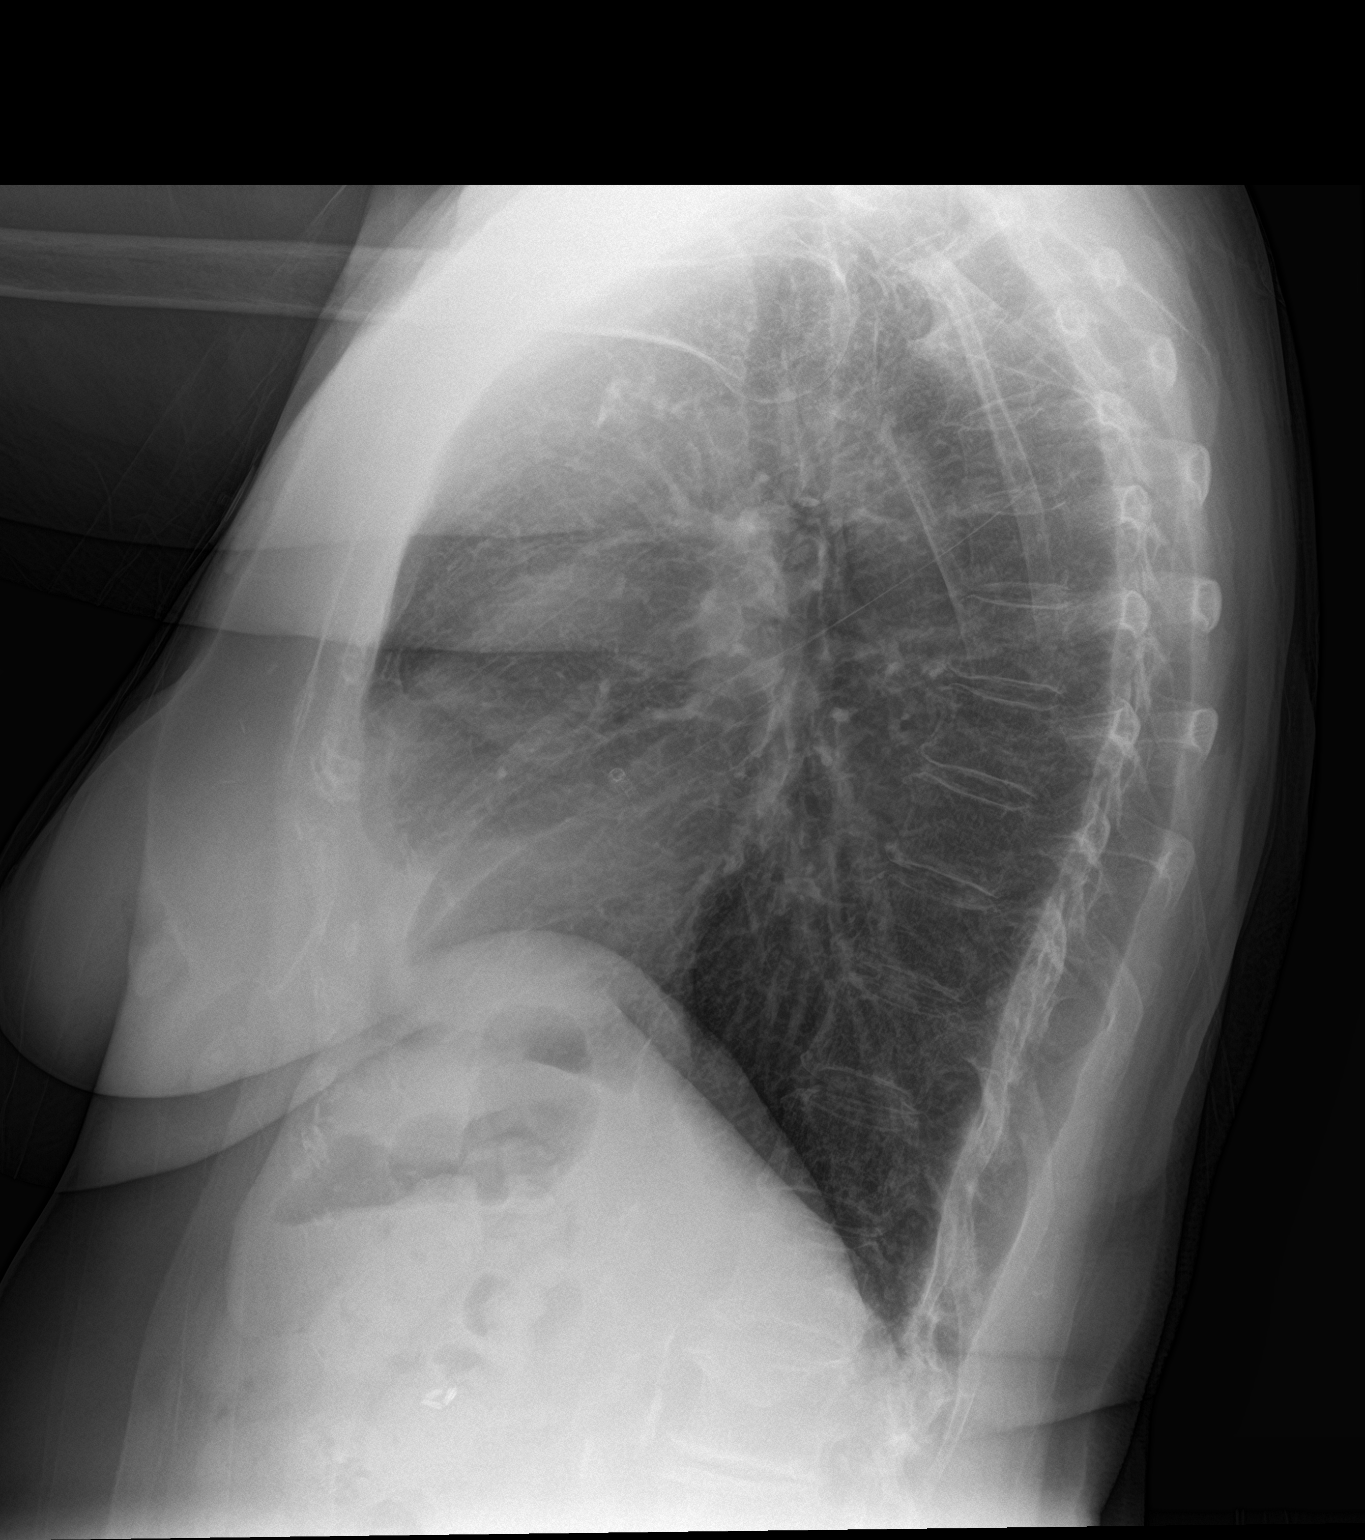

[2 of 2 positions shown; findings below may reference images not displayed]

FINDINGS: The cardiomediastinal silhouette is within normal limits. Aortic
atherosclerosis is noted. The lungs remain hyperinflated without
evidence of airspace consolidation, edema, pleural effusion, or
pneumothorax. No acute osseous abnormality is seen.
IMPRESSION: No active cardiopulmonary disease.

## 2018-03-03 NOTE — Telephone Encounter (Signed)
Returned the call to the patient. She stated that for the last several days she has been having increased shortness of breath and difficulty breathing. When she tries to take a deep breath she feels sharp pains in the chest and lung area. She has also been dizzy and having a hard time when ambulating.   The patient has been advised to go to the ED for further assessment. Wannetta Sender has been called and notified.

## 2018-03-03 NOTE — ED Notes (Signed)
Patient Alert and oriented to baseline. Stable and ambulatory to baseline. Patient verbalized understanding of the discharge instructions.  Patient belongings were taken by the patient.   

## 2018-03-03 NOTE — Telephone Encounter (Signed)
New Message:    Pt says she is having a hard time breathing.She is also hurting in her ribs and feels like something heavy is laying on her chest.

## 2018-03-03 NOTE — ED Notes (Signed)
Sat greater than 97% while ambulating. Felt lightheaded.

## 2018-03-03 NOTE — ED Triage Notes (Signed)
Pt presents with 3 week h/o shortness of breath and chest discomfort.  Pt reports pain around bra-line; denies any long distance travel, denies any leg edema.

## 2018-03-03 NOTE — ED Provider Notes (Signed)
[facility] EMERGENCY DEPARTMENT Provider Note   CSN: [id number] Arrival date & time: 03/03/18  1541     History   Chief Complaint Chief Complaint  Patient presents with  . Shortness of Breath    HPI Kara Woods is a 84 y.o. female.  The history is provided by the patient. No language interpreter was used.  Shortness of Breath     Kara Woods is a 84 y.o. female who presents to the Emergency Department complaining of sob. She reports three weeks of bands like tight sensation across her rib cage and bra line. She has associated shortness of breath for the last couple of days. She has dyspnea on exertion with a central chest heaviness that is waxing and waning. She denies any fevers, cough, leg swelling or pain. She questions of this could be related to her Kary Kos use that she has been on since December 2017. Symptoms are moderate, waxing and waning and worsening.  Past Medical History:  Diagnosis Date  . Anxiety   . Arthritis   . Blind left eye   . Breast cancer ([place])    left   . Colitis   . Coronary artery disease    a. 10/2016 Staged PCI: RCA 50p, 46m(2.25x12 Resolute Onyx DES), LCX 50p, 840m2.75x23 Xience Alpine DES). Residual LAD 50p/m, 4531m  . CMarland Kitchenstocele 10/11/2013  . Depression   . Diastolic dysfunction    a. 09/2016 Echo: EF 50%, diffuse hypokinesis, mild LVH, grade 1 diastolic dysfunction, mild mitral regurgitation, mildly dilated left atrium.  . GMarland KitchenRD (gastroesophageal reflux disease)    occasional Tums only  . HOH (hard of hearing)   . Pelvic relaxation 10/11/2013  . Proctitis    colonsocopy 2007, Canasa suppositories  . S/P endoscopy March 2009   mild erosive reflux esophagitis, Schatzki's ring, s/p dilation  . Schatzki's ring   . Shortness of breath    occ if anxiety  . Wears dentures    upper  . Wears glasses     Patient Active Problem List   Diagnosis Date Noted  . Dizziness 11/19/2016  . CAD (coronary artery  disease) 11/06/2016  . Essential hypertension 11/06/2016  . Unstable angina ([facility]2/09/2016  . S/P vaginal hysterectomy 05/17/2014  . Cystocele 10/11/2013  . Pelvic relaxation 10/11/2013  . Cancer of left breast ([place]/27/2014  . Diarrhea 06/23/2012  . Schatzki's ring 07/16/2011  . COLLES' FRACTURE, LEFT 04/04/2010  . CHEST PAIN UNSPECIFIED 09/18/2009  . HYPERLIPIDEMIA 07/28/2008  . ANXIETY 07/28/2008  . MACULAR DEGENERATION, BILATERAL 07/28/2008  . CAD 07/28/2008  . GERD 07/28/2008  . PROCTITIS 07/28/2008  . OSTEOPENIA 07/28/2008  . RETINAL DETACHMENT, LEFT EYE, HX OF 07/28/2008    Past Surgical History:  Procedure Laterality Date  . ANTERIOR AND POSTERIOR REPAIR N/A 05/17/2014   Procedure: ANTERIOR (CYSTOCELE) AND POSTERIOR REPAIR (RECTOCELE);  Surgeon: ScoReece PackerD;  [facility];  Service: Urology;  Laterality: N/A;  . BREAST LUMPECTOMY WITH NEEDLE LOCALIZATION AND AXILLARY SENTINEL LYMPH NODE BX Left 05/03/2013   Procedure: BREAST LUMPECTOMY WITH NEEDLE LOCALIZATION AND AXILLARY SENTINEL LYMPH NODE BX;  Surgeon: BenEdward JollyD;  [facility];  Laterality: Left;  . BREAST SURGERY Left 05/19/13  . CARDIAC CATHETERIZATION  1998  . CARDIAC CATHETERIZATION N/A 11/04/2016   Procedure: Left Heart Cath and Coronary Angiography;  Surgeon: ThoTroy SineD;  Location: [facility];  Service: Cardiovascular;  Laterality: N/A;  .  CARDIAC CATHETERIZATION N/A 11/05/2016   Procedure: Coronary Stent Intervention;  Surgeon: Troy Sine, MD;  Location: [facility];  Service: Cardiovascular;  Laterality: N/A;  . COLONOSCOPY  05/06/2006   Diffuse inflammatory changes of the rectal mucosa, consistent  with proctitis.  Otherwise, normal colon to terminal ileum  . CORONARY ANGIOPLASTY  11/04/2016  . CORONARY STENT PLACEMENT     Drug-eluting coronary artery stent, non-bioabsorbable-polymer-coated  . CYSTOSCOPY N/A  05/17/2014   Procedure: CYSTOSCOPY;  Surgeon: Reece Packer, MD;  Location: [facility];  Service: Urology;  Laterality: N/A;  . ESOPHAGOGASTRODUODENOSCOPY  02/09/2008   Schatzki ring status post dilation/Distal esophageal erosion consistent with mild erosive reflux  esophagitis, otherwise unremarkable esophagus, normal stomach, D1, D2.  . EYE SURGERY     lt cataract-implant  . EYE SURGERY     lt-lens repaced,l  . [place] SURGERY  1993  . RE-EXCISION OF BREAST LUMPECTOMY Left 05/19/2013   Procedure: RE-EXCISION OF BREAST LUMPECTOMY;  Surgeon: Edward Jolly, MD;  Location: [facility];  Service: General;  Laterality: Left;  . RETINAL DETACHMENT SURGERY  1978   Left  . VAGINAL HYSTERECTOMY Bilateral 05/17/2014   Procedure: HYSTERECTOMY VAGINAL with Bilateral Salpingo-Oophorectomy; Bladder cystotomy repair;  Surgeon: Marvene Staff, MD;  Location: [facility];  Service: Gynecology;  Laterality: Bilateral;  . VAGINAL PROLAPSE REPAIR N/A 05/17/2014   Procedure: VAGINAL VAULT PROLAPSE AND GRAFT;  Surgeon: Reece Packer, MD;  Location: [facility];  Service: Urology;  Laterality: N/A;     OB History    Gravida  6   Para  6   Term      Preterm      AB      Living  5     SAB      TAB      Ectopic      Multiple      Live Births  6            Home Medications    Prior to Admission medications   Medication Sig Start Date End Date Taking? Authorizing Provider  acetaminophen (TYLENOL) 500 MG tablet Take 500-1,000 mg by mouth every 6 (six) hours as needed for mild pain.    [provider]  aspirin EC 81 MG tablet Take 1 tablet (81 mg total) by mouth daily. 10/31/16   Troy Sine, MD  BRILINTA 90 MG TABS tablet TAKE (1) TABLET BY MOUTH TWICE DAILY. 11/27/17   Troy Sine, MD  carboxymethylcellulose (REFRESH PLUS) 0.5 % SOLN Place 3-4 drops into both eyes 3 (three) times daily as needed (dry eyes). Preservative free    [provider]   clorazepate (TRANXENE) 15 MG tablet Take 7.5 mg by mouth 2 (two) times daily as needed for anxiety. 10/25/16   [provider]  ezetimibe (ZETIA) 10 MG tablet TAKE ONE TABLET BY MOUTH ONCE DAILY. 09/08/17   Troy Sine, MD  metoprolol tartrate (LOPRESSOR) 25 MG tablet TAKE 1/2 TABLET BY MOUTH TWICE DAILY. 01/14/18   Troy Sine, MD  nitroGLYCERIN (NITROSTAT) 0.4 MG SL tablet Place 1 tablet (0.4 mg total) under the tongue every 5 (five) minutes as needed. 11/06/16   Cheryln Manly, NP  Pitavastatin Calcium (LIVALO) 1 MG TABS Take 1 tablet (1 mg total) by mouth daily. 10/30/17   Troy Sine, MD  Travoprost, BAK Free, (TRAVATAN) 0.004 % SOLN ophthalmic solution Place 1 drop into the right eye at bedtime.  [provider]    Family History Family History  Problem Relation Age of Onset  . Cancer Brother        spinal  . Multiple myeloma Daughter   . Colon cancer Neg Hx     Social History Social History   Tobacco Use  . Smoking status: Former Smoker    Packs/day: 1.00    Types: Cigarettes    Last attempt to quit: 04/28/1990    Years since quitting: 27.8  . Smokeless tobacco: Never Used  . Tobacco comment: quit 19 yrs ago  Substance Use Topics  . Alcohol use: Yes    Comment: occ  . Drug use: No     Allergies   Contrast media [iodinated diagnostic agents]; Lipitor [atorvastatin]; and Sulfonamide derivatives   Review of Systems Review of Systems  Respiratory: Positive for shortness of breath.   All other systems reviewed and are negative.    Physical Exam Updated Vital Signs BP (!) 141/86 (BP Location: Left Arm)   Pulse 71   Temp 97.7 F (36.5 C) (Oral)   Resp 16   Ht 5' 6.5" (1.689 m)   Wt 74.8 kg (165 lb)   SpO2 97%   BMI 26.23 kg/m   Physical Exam  Constitutional: She is oriented to person, place, and time. She appears well-developed and well-nourished.  HENT:  Head: Normocephalic and atraumatic.  Cardiovascular: Normal rate  and regular rhythm.  No murmur heard. Pulmonary/Chest: Effort normal and breath sounds normal. No respiratory distress.  Abdominal: Soft. There is no tenderness. There is no rebound and no guarding.  Musculoskeletal: She exhibits no edema or tenderness.  Neurological: She is alert and oriented to person, place, and time.  Skin: Skin is warm and dry.  Psychiatric: She has a normal mood and affect. Her behavior is normal.  Nursing note and vitals reviewed.    ED Treatments / Results  Labs (all labs ordered are listed, but only abnormal results are displayed) Labs Reviewed  BASIC METABOLIC PANEL - Abnormal; Notable for the following components:      Result Value   Glucose, Bld 137 (*)    All other components within normal limits  D-DIMER, QUANTITATIVE (NOT AT [facility]) - Abnormal; Notable for the following components:   D-Dimer, Quant 0.63 (*)    All other components within normal limits  HEPATIC FUNCTION PANEL - Abnormal; Notable for the following components:   ALT 11 (*)    Bilirubin, Direct <0.1 (*)    All other components within normal limits  CBC WITH DIFFERENTIAL/PLATELET  I-STAT TROPONIN, ED  I-STAT TROPONIN, ED    EKG EKG Interpretation  Date/Time:  Tuesday March 03 2018 15:47:40 EDT Ventricular Rate:  80 PR Interval:  154 QRS Duration: 82 QT Interval:  392 QTC Calculation: 452 R Axis:   65 Text Interpretation:  Sinus rhythm with Premature supraventricular complexes Nonspecific ST and T wave abnormality Abnormal ECG Confirmed by Quintella Reichert [phone]) on 03/03/2018 6:55:08 PM   Radiology Dg Chest 2 View  Result Date: 03/03/2018 CLINICAL DATA:  Shortness of breath. EXAM: CHEST - 2 VIEW COMPARISON:  11/19/2016 FINDINGS: The cardiomediastinal silhouette is within normal limits. Aortic atherosclerosis is noted. The lungs remain hyperinflated without evidence of airspace consolidation, edema, pleural effusion, or pneumothorax. No acute osseous abnormality is seen.  IMPRESSION: No active cardiopulmonary disease. Electronically Signed   By: Logan Bores M.D.   On: 03/03/2018 17:57    Procedures Procedures (including critical care time)  Medications Ordered in  ED Medications - No data to display   Initial Impression / Assessment and Plan / ED Course  I have reviewed the triage vital signs and the nursing notes.  Pertinent labs & imaging results that were available during my care of the patient were reviewed by me and considered in my medical decision making (see chart for details).     Patient here for evaluation of progressive shortness of breath. She is non-toxic appearing on examination with no respiratory distress. Lungs are clear bilaterally. No evidence of acute anemia, kidney injury or electrolyte abnormality. No evidence of acute pneumonia, CHF. Presentation is not consistent with PE. Dimer negative based on age-adjusted D dimer. No desaturations in the department discussed with patient unclear source of her dyspnea. Plan to discharge home with close outpatient follow-up and return precautions.  Final Clinical Impressions(s) / ED Diagnoses   Final diagnoses:  Shortness of breath    ED Discharge Orders    None       Quintella Reichert, MD 03/03/18 2323

## 2018-03-09 DIAGNOSIS — E782 Mixed hyperlipidemia: Secondary | ICD-10-CM | POA: Diagnosis not present

## 2018-03-09 DIAGNOSIS — F5089 Other specified eating disorder: Secondary | ICD-10-CM | POA: Diagnosis not present

## 2018-03-09 DIAGNOSIS — J029 Acute pharyngitis, unspecified: Secondary | ICD-10-CM | POA: Diagnosis not present

## 2018-03-09 DIAGNOSIS — F411 Generalized anxiety disorder: Secondary | ICD-10-CM | POA: Diagnosis not present

## 2018-03-09 DIAGNOSIS — I2510 Atherosclerotic heart disease of native coronary artery without angina pectoris: Secondary | ICD-10-CM | POA: Diagnosis not present

## 2018-03-09 DIAGNOSIS — Z6826 Body mass index (BMI) 26.0-26.9, adult: Secondary | ICD-10-CM | POA: Diagnosis not present

## 2018-03-09 DIAGNOSIS — I10 Essential (primary) hypertension: Secondary | ICD-10-CM | POA: Diagnosis not present

## 2018-03-09 DIAGNOSIS — K219 Gastro-esophageal reflux disease without esophagitis: Secondary | ICD-10-CM | POA: Diagnosis not present

## 2018-03-10 DIAGNOSIS — I10 Essential (primary) hypertension: Secondary | ICD-10-CM | POA: Diagnosis not present

## 2018-03-10 DIAGNOSIS — I2510 Atherosclerotic heart disease of native coronary artery without angina pectoris: Secondary | ICD-10-CM | POA: Diagnosis not present

## 2018-03-10 DIAGNOSIS — E782 Mixed hyperlipidemia: Secondary | ICD-10-CM | POA: Diagnosis not present

## 2018-03-10 DIAGNOSIS — Z0000 Encounter for general adult medical examination without abnormal findings: Secondary | ICD-10-CM | POA: Diagnosis not present

## 2018-03-10 DIAGNOSIS — F411 Generalized anxiety disorder: Secondary | ICD-10-CM | POA: Diagnosis not present

## 2018-03-30 ENCOUNTER — Encounter: Payer: Self-pay | Admitting: Cardiovascular Disease

## 2018-03-30 ENCOUNTER — Ambulatory Visit (INDEPENDENT_AMBULATORY_CARE_PROVIDER_SITE_OTHER): Payer: Medicare Other | Admitting: Cardiovascular Disease

## 2018-03-30 VITALS — BP 124/82 | HR 66 | Ht 66.0 in | Wt 167.2 lb

## 2018-03-30 DIAGNOSIS — R0600 Dyspnea, unspecified: Secondary | ICD-10-CM

## 2018-03-30 DIAGNOSIS — I2510 Atherosclerotic heart disease of native coronary artery without angina pectoris: Secondary | ICD-10-CM

## 2018-03-30 DIAGNOSIS — Z79899 Other long term (current) drug therapy: Secondary | ICD-10-CM | POA: Diagnosis not present

## 2018-03-30 DIAGNOSIS — E785 Hyperlipidemia, unspecified: Secondary | ICD-10-CM

## 2018-03-30 DIAGNOSIS — R0609 Other forms of dyspnea: Secondary | ICD-10-CM

## 2018-03-30 MED ORDER — ROSUVASTATIN CALCIUM 5 MG PO TABS: 5.0000 mg | ORAL_TABLET | ORAL | 3 refills | Status: DC

## 2018-03-30 MED ORDER — SPIRONOLACTONE 25 MG PO TABS: 12.5000 mg | ORAL_TABLET | Freq: Every day | ORAL | 3 refills | Status: DC

## 2018-03-30 NOTE — Progress Notes (Signed)
Cardiology Office Note    Date:  03/30/2018   ID:  KARLEE STAFF, DOB [birth year], MRN [id number]  PCP:  Celene Squibb, MD  Referring M.D.: Dr. Velvet Bathe Cardiologist:  Shelva Majestic, MD   Chief Complaint  Patient presents with  . Follow-up    History of Present Illness:  Kara Woods is a 84 y.o. female who presents for a 6 month follow-up cardiology evaluation and follow-up of a recent emergency room evaluation  Kara Woods is a mother of my patient, who had suffered an out of hospital cardiac arrest years ago.  Kara Woods has a history of prior cardiac catheterization and from records [name] had undergone cardiac catheterization in 1998.  The patient states her coronaries were clean.  I do not have specifics of her catheterization findings.  Over the past 6 months, she has developed progressive decline in ability to be active and has noticed significant increasing exertionally precipitated shortness of breath.  She also has had some episodes of associated chest pressure with left arm radiation.  She had undergone an echo Doppler study on 10/24/2016 which showed an EF of 50% and suggested diffuse hypokinesis.  There was grade 1 diastolic dysfunction.  There was mild MR and mild LA dilatation.  She also had undergone pulmonary function testing was not significantly abnormal.  Her past history is notable for left breast CA and she is status post prior lumpectomy and radiation treatment.  She was cared for by Dr. Excell Seltzer.  She also is status post hysterectomy.  Remote history of cholecystectomy.  She has a history of retinal detachment.  When I saw her, due to progressive symptomatology with decline in exercise tolerance and increasing exertional dyspnea with a very strong family history of CAD.  Definitive cardiac catheterization was recommended.  This was performed by me on 11/04/2016 and revealed low normal global LV function with an EF of 50-55% with mild mid inferior focal  hypocontractility.  There was multivessel CAD with multiple stenoses of 40-50% mid to distal LAD.  She had a normal ramus intermediate vessel.  There was 80% focal stenosis in the left circumflex and 95% eccentric mid RCA stenosis followed by 50% narrowing.  She underwent initial PCI to the mid RCA with insertion of a 2.2512 mm Resolute Onyx stent postdilated to 2.5 mm with the 95% stenoses being reduced to 0%.  The following day, on 11/05/2016 she underwent staged intervention to the left circumflex flex artery with the 50 and 85% stenoses being reduced to 0% with insertion of a 2.7523 mm  Xience Alpine DES stent postdilated to 3.0.  Since her intervention, she has felt significantly improved.  She had mild dyspnea more attributed to air hunger and this typically is relieved when she takes a deep sigh.   She was started on Lipitor but did not tolerate this.  Subsequently, I recommended initiation of Crestor at 5 mg 2 times a week.  She took 2 doses and then felt she had similar symptoms and stopped taking this.  She denies any exertional chest tightness.  She was diagnosed with a left knee Baker's cyst.  She denies palpitations.    She underwent a one-year follow-up nuclear stress test on 10/09/2017 which remained low risk and showed an EF of 51% without scar or ischemia.    She was recently evaluated at [facility] emergency room when she was brought there by family members with complaints of increasing shortness of breath.  And vague nonexertional  chest tightness.  There was concern of whether or not Brilinta could responsible.  Laboratory was negative.  She was not found to have evidence for pneumonia or CHF and had a negative d-dimer.  She denies any episodes of chest pain since her evaluation.  She continues to experience exertional dyspnea.  He has been on aspirin and Brilinta for antiplatelet therapy, Zetia 10 mg for hyperlipidemia although when last seen I suggested a trial of Livalo which she never  pursued.  She is unaware of palpitations.  She denies bleeding.  She presents for reevaluation.  Past Medical History:  Diagnosis Date  . Anxiety   . Arthritis   . Blind left eye   . Breast cancer ([place])    left   . Colitis   . Coronary artery disease    a. 10/2016 Staged PCI: RCA 50p, 25m(2.25x12 Resolute Onyx DES), LCX 50p, 830m2.75x23 Xience Alpine DES). Residual LAD 50p/m, 4564m  . CMarland Kitchenstocele 10/11/2013  . Depression   . Diastolic dysfunction    a. 09/2016 Echo: EF 50%, diffuse hypokinesis, mild LVH, grade 1 diastolic dysfunction, mild mitral regurgitation, mildly dilated left atrium.  . GMarland KitchenRD (gastroesophageal reflux disease)    occasional Tums only  . HOH (hard of hearing)   . Pelvic relaxation 10/11/2013  . Proctitis    colonsocopy 2007, Canasa suppositories  . S/P endoscopy March 2009   mild erosive reflux esophagitis, Schatzki's ring, s/p dilation  . Schatzki's ring   . Shortness of breath    occ if anxiety  . Wears dentures    upper  . Wears glasses     Past Surgical History:  Procedure Laterality Date  . ANTERIOR AND POSTERIOR REPAIR N/A 05/17/2014   Procedure: ANTERIOR (CYSTOCELE) AND POSTERIOR REPAIR (RECTOCELE);  Surgeon: ScoReece PackerD;  [facility];  Service: Urology;  Laterality: N/A;  . BREAST LUMPECTOMY WITH NEEDLE LOCALIZATION AND AXILLARY SENTINEL LYMPH NODE BX Left 05/03/2013   Procedure: BREAST LUMPECTOMY WITH NEEDLE LOCALIZATION AND AXILLARY SENTINEL LYMPH NODE BX;  Surgeon: BenEdward JollyD;  [facility];  Laterality: Left;  . BREAST SURGERY Left 05/19/13  . CARDIAC CATHETERIZATION  1998  . CARDIAC CATHETERIZATION N/A 11/04/2016   Procedure: Left Heart Cath and Coronary Angiography;  Surgeon: ThoTroy SineD;  [facility];  Service: Cardiovascular;  Laterality: N/A;  . CARDIAC CATHETERIZATION N/A 11/05/2016   Procedure: Coronary Stent Intervention;  Surgeon: ThoTroy SineD;  Location: [facility];  Service: Cardiovascular;  Laterality: N/A;  . COLONOSCOPY  05/06/2006   Diffuse inflammatory changes of the rectal mucosa, consistent  with proctitis.  Otherwise, normal colon to terminal ileum  . CORONARY ANGIOPLASTY  11/04/2016  . CORONARY STENT PLACEMENT     Drug-eluting coronary artery stent, non-bioabsorbable-polymer-coated  . CYSTOSCOPY N/A 05/17/2014   Procedure: CYSTOSCOPY;  Surgeon: ScoReece PackerD;  [facility];  Service: Urology;  Laterality: N/A;  . ESOPHAGOGASTRODUODENOSCOPY  02/09/2008   Schatzki ring status post dilation/Distal esophageal erosion consistent with mild erosive reflux  esophagitis, otherwise unremarkable esophagus, normal stomach, D1, D2.  . EYE SURGERY     lt cataract-implant  . EYE SURGERY     lt-lens repaced,l  . [place]  . RE-EXCISION OF BREAST LUMPECTOMY Left 05/19/2013   Procedure: RE-EXCISION OF BREAST LUMPECTOMY;  Surgeon: BenEdward JollyD;  [facility];  Laterality:  Left;  . RETINAL DETACHMENT SURGERY  1978   Left  . VAGINAL HYSTERECTOMY Bilateral 05/17/2014   Procedure: HYSTERECTOMY VAGINAL with Bilateral Salpingo-Oophorectomy; Bladder cystotomy repair;  Surgeon: Marvene Staff, MD;  Location: [facility];  Service: Gynecology;  Laterality: Bilateral;  . VAGINAL PROLAPSE REPAIR N/A 05/17/2014   Procedure: VAGINAL VAULT PROLAPSE AND GRAFT;  Surgeon: Reece Packer, MD;  Location: [facility];  Service: Urology;  Laterality: N/A;    Current Medications: Outpatient Medications Prior to Visit  Medication Sig Dispense Refill  . acetaminophen (TYLENOL) 500 MG tablet Take 500-1,000 mg by mouth every 6 (six) hours as needed for mild pain.    Marland Kitchen aspirin EC 81 MG tablet Take 1 tablet (81 mg total) by mouth daily. 90 tablet 3  . BRILINTA 90 MG TABS tablet TAKE (1) TABLET BY MOUTH TWICE DAILY. 60 tablet 6  . carboxymethylcellulose (REFRESH  PLUS) 0.5 % SOLN Place 3-4 drops into both eyes 3 (three) times daily as needed (dry eyes). Preservative free    . clorazepate (TRANXENE) 15 MG tablet Take 7.5 mg by mouth 2 (two) times daily as needed for anxiety.    Marland Kitchen ezetimibe (ZETIA) 10 MG tablet TAKE ONE TABLET BY MOUTH ONCE DAILY. 30 tablet 6  . metoprolol tartrate (LOPRESSOR) 25 MG tablet TAKE 1/2 TABLET BY MOUTH TWICE DAILY. 90 tablet 1  . nitroGLYCERIN (NITROSTAT) 0.4 MG SL tablet Place 1 tablet (0.4 mg total) under the tongue every 5 (five) minutes as needed. 25 tablet 3  . Travoprost, BAK Free, (TRAVATAN) 0.004 % SOLN ophthalmic solution Place 1 drop into the right eye at bedtime.     . Pitavastatin Calcium (LIVALO) 1 MG TABS Take 1 tablet (1 mg total) by mouth daily. 30 tablet 3   No facility-administered medications prior to visit.      Allergies:   Contrast media [iodinated diagnostic agents]; Lipitor [atorvastatin]; and Sulfonamide derivatives   Social History   Socioeconomic History  . Marital status: Widowed    Spouse name: Not on file  . Number of children: Not on file  . Years of education: Not on file  . Highest education level: Not on file  Occupational History  . Occupation: Retired  Scientific laboratory technician  . Financial resource strain: Not on file  . Food insecurity:    Worry: Not on file    Inability: Not on file  . Transportation needs:    Medical: Not on file    Non-medical: Not on file  Tobacco Use  . Smoking status: Former Smoker    Packs/day: 1.00    Types: Cigarettes    Last attempt to quit: 04/28/1990    Years since quitting: 27.9  . Smokeless tobacco: Never Used  . Tobacco comment: quit 19 yrs ago  Substance and Sexual Activity  . Alcohol use: Yes    Comment: occ  . Drug use: No  . Sexual activity: Not Currently    Birth control/protection: Post-menopausal  Lifestyle  . Physical activity:    Days per week: Not on file    Minutes per session: Not on file  . Stress: Not on file  Relationships  .  Social connections:    Talks on phone: Not on file    Gets together: Not on file    Attends religious service: Not on file    Active member of club or organization: Not on file    Attends meetings of clubs or organizations: Not on file    Relationship status: Not  on file  Other Topics Concern  . Not on file  Social History Narrative  . Not on file     Family History:  The patient's family history includes Cancer in her brother; Multiple myeloma in her daughter.  I mother died and had a myocardial infarction.  Her father also had suffered a myocardial infarction.  She had 3 sisters who also had myocardial infarction and also one of her brothers also had suffered a myocardial infarction.  ROS General: Negative; No fevers, chills, or night sweats;  HEENT: History of retinal detachment, sinus congestion, difficulty swallowing Pulmonary: Negative; No cough, wheezing, shortness of breath, hemoptysis Cardiovascular: see HPI GI: Positive for occasional loose stools GU: Negative; No dysuria, hematuria, or difficulty voiding Musculoskeletal: Positive for left Baker's cyst Hematologic/Oncology: Negative; no easy bruising, bleeding Endocrine: Negative; no heat/cold intolerance; no diabetes Neuro: Negative; no changes in balance, headaches Skin: Negative; No rashes or skin lesions Psychiatric: Negative; No behavioral problems, depression Sleep: Negative; No snoring, daytime sleepiness, hypersomnolence, bruxism, restless legs, hypnogognic hallucinations, no cataplexy Other comprehensive 14 point system review is negative.   PHYSICAL EXAM:   VS:  BP 124/82   Pulse 66   Ht 5' 6"  (1.676 m)   Wt 167 lb 3.2 oz (75.8 kg)   BMI 26.99 kg/m     Repeat blood pressure by me was 122/80  Wt Readings from Last 3 Encounters:  03/30/18 167 lb 3.2 oz (75.8 kg)  03/03/18 165 lb (74.8 kg)  10/30/17 160 lb (72.6 kg)    General: Alert, oriented, no distress.  Skin: normal turgor, no rashes, warm and  dry HEENT: Normocephalic, atraumatic. Pupils equal round and reactive to light; sclera anicteric; extraocular muscles intact;  Nose without nasal septal hypertrophy Mouth/Parynx benign; Mallinpatti scale 3 Neck: No JVD, no carotid bruits; normal carotid upstroke Lungs: clear to ausculatation and percussion; no wheezing or rales Chest wall: without tenderness to palpitation Heart: PMI not displaced, RRR, s1 s2 normal, 1/6 systolic murmur, no diastolic murmur, no rubs, gallops, thrills, or heaves Abdomen: soft, nontender; no hepatosplenomehaly, BS+; abdominal aorta nontender and not dilated by palpation. Back: no CVA tenderness Pulses 2+ Musculoskeletal: full range of motion, normal strength, no joint deformities Extremities: no clubbing cyanosis or edema, Homan's sign negative  Neurologic: grossly nonfocal; Cranial nerves grossly wnl Psychologic: Normal mood and affect   Studies/Labs Reviewed:   ECG (independently read by me): Normal sinus rhythm at 66 bpm.  No ectopy.  No ST segment changes.  December 2018 ECG (independently read by me): Normal sinus rhythm at 77 bpm.  Nonspecific ST changes.  June 2018 ECG (independently read by me): Sinus bradycardia 56 bpm.  Normal intervals.  No significant ST-T changes.  March 2018 ECG (independently read by me): Sinus bradycardia 54 bpm.  Normal intervals.  No ST segment changes.  December 2017 ECG (independently read by me): Normal sinus rhythm at 74 bpm.  No ectopy.  QTc interval 455 ms.  Recent Labs: BMP Latest Ref Rng & Units 03/03/2018 06/24/2017 11/19/2016  Glucose 65 - 99 mg/dL 137(H) 96 95  BUN 6 - 20 mg/dL 8 12 12   Creatinine 0.44 - 1.00 mg/dL 0.79 0.77 0.85  Sodium 135 - 145 mmol/L 138 140 139  Potassium 3.5 - 5.1 mmol/L 3.7 4.8 4.6  Chloride 101 - 111 mmol/L 105 106 105  CO2 22 - 32 mmol/L 24 28 25   Calcium 8.9 - 10.3 mg/dL 9.2 8.9 9.1     Hepatic Function Latest Ref Rng &  Units 03/03/2018 06/24/2017 11/19/2016  Total Protein 6.5  - 8.1 g/dL 7.3 7.2 6.8  Albumin 3.5 - 5.0 g/dL 3.7 3.7 3.7  AST 15 - 41 U/L 18 24 19   ALT 14 - 54 U/L 11(L) 15 14  Alk Phosphatase 38 - 126 U/L 61 58 57  Total Bilirubin 0.3 - 1.2 mg/dL 0.6 0.7 0.6  Bilirubin, Direct 0.1 - 0.5 mg/dL <0.1(L) - 0.1    CBC Latest Ref Rng & Units 03/03/2018 06/24/2017 11/19/2016  WBC 4.0 - 10.5 K/uL 9.9 8.0 9.1  Hemoglobin 12.0 - 15.0 g/dL 12.9 12.4 12.7  Hematocrit 36.0 - 46.0 % 39.6 37.6 39.0  Platelets 150 - 400 K/uL 255 269 236   Lab Results  Component Value Date   MCV 93.0 03/03/2018   MCV 93.3 06/24/2017   MCV 93.8 11/19/2016   No results found for: TSH No results found for: HGBA1C   BNP    Component Value Date/Time   BNP 51.0 09/01/2016 1012    ProBNP No results found for: PROBNP   Lipid Panel     Component Value Date/Time   CHOL 227 (H) 10/31/2016 1245   TRIG 164 (H) 10/31/2016 1245   HDL 47 (L) 10/31/2016 1245   CHOLHDL 4.8 10/31/2016 1245   VLDL 33 (H) 10/31/2016 1245   LDLCALC 147 (H) 10/31/2016 1245     RADIOLOGY: Dg Chest 2 View  Result Date: 03/03/2018 CLINICAL DATA:  Shortness of breath. EXAM: CHEST - 2 VIEW COMPARISON:  11/19/2016 FINDINGS: The cardiomediastinal silhouette is within normal limits. Aortic atherosclerosis is noted. The lungs remain hyperinflated without evidence of airspace consolidation, edema, pleural effusion, or pneumothorax. No acute osseous abnormality is seen. IMPRESSION: No active cardiopulmonary disease. Electronically Signed   By: Logan Bores M.D.   On: 03/03/2018 17:57     Additional studies/ records that were reviewed today include:   At her last office visit I reviewed the office records of [name].  I reviewed her echo Doppler study and pulmonary function tests.    ASSESSMENT:    1. Exertional dyspnea   2. Medication management   3. Hyperlipidemia with target LDL less than 70   4. CAD in native artery     PLAN:  Kara Woods is an 84 year old Caucasian female who had  developed progressive decline in exercise tolerance with significant increasing shortness of breath with minimal activity associated with chest pressure and left arm radiation.  In 1998, reportedly a cardiac catheterization had shown normal coronary arteries.  An echo Doppler study from November 2017 showed an EF of 50% with diffuse hypokinesis.  There was mild MR and mild dilatation of her left atrium. Her pulmonary function studies did not identified significant pulmonary disability in the etiology of her dyspnea.  Her very strong family history of myocardial infarctions in multiple family members, as well as remote tobacco history and progressive changes in symptomatology.  Cardiac catheterization revealed severe multivessel CAD as noted above and she underwent successful stenting of her RCA as well as her left circumflex vessels in a staged procedure.  She has been without recurrent anginal symptomatology.  A nuclear perfusion study from 10/09/2017 continued to show normal perfusion, without scar or ischemia.  EF was 51%.  She has been intolerant to numerous statins.  When I last saw her I discussed the possibility of Livalo but apparently I am uncertain if she ever took this she has been on Zetia.  We also had discussed PCSK9 inhibition but she  had no interest.  She now presents with exertional dyspnea.  She denies any anginal type symptoms.  I reviewed her emergency room evaluation in detail.  I have recommended she undergo a follow-up echo Doppler study to reassess systolic and diastolic function as well as valvular architecture.  In the past she was demonstrated to have mild diastolic dysfunction.  I am adding spironolactone 12.5 mg to her regimen to see if this improves her symptomatology.  I have also suggested a trial of Crestor 5 mg 1 day a week to take in addition to her Zetia since laboratory recently checked by Dr. Nevada Crane had shown an LDL cholesterol at 96.  All other laboratory was stable.  In 2 weeks  she will undergo a bmet with the addition of spironolactone.  As long as she remains stable, I will see her in 3 months for reevaluation.  Medication Adjustments/Labs and Tests Ordered: Current medicines are reviewed at length with the patient today.  Concerns regarding medicines are outlined above.  Medication changes, Labs and Tests ordered today are listed in the Patient Instructions below.  Patient Instructions  Medication Instructions:  START rosuvastatin (Crestor) 5 mg --take 1 tablet once weekly  START spironolactone 12.5 mg (1/2 tablet) daily  Labwork: Please return for labs in 2 weeks (BMET)  Our in office lab hours are Monday-Friday 8:00-4:00, closed for lunch 12:45-1:45 pm.  No appointment needed.   Testing/Procedures: Your physician has requested that you have an echocardiogram. Echocardiography is a painless test that uses sound waves to create images of your heart. It provides your doctor with information about the size and shape of your heart and how well your heart's chambers and valves are working. This procedure takes approximately one hour. There are no restrictions for this procedure.  This will be done at our [facility] location:  [place]: 3 months with [name]  Any Other Special Instructions Will Be Listed Below (If Applicable).     If you need a refill on your cardiac medications before your next appointment, please call your pharmacy.      Signed, Shelva Majestic, MD, [facility] 03/30/2018 9:26 AM    [facility] [address], [place], [place], [place]  [address] Phone: [phone]

## 2018-03-30 NOTE — Patient Instructions (Signed)
Medication Instructions:  START rosuvastatin (Crestor) 5 mg --take 1 tablet once weekly  START spironolactone 12.5 mg (1/2 tablet) daily  Labwork: Please return for labs in 2 weeks (BMET)  Our in office lab hours are Monday-Friday 8:00-4:00, closed for lunch 12:45-1:45 pm.  No appointment needed.   Testing/Procedures: Your physician has requested that you have an echocardiogram. Echocardiography is a painless test that uses sound waves to create images of your heart. It provides your doctor with information about the size and shape of your heart and how well your heart's chambers and valves are working. This procedure takes approximately one hour. There are no restrictions for this procedure.  This will be done at our [facility] location:  [place]: 3 months with [name]  Any Other Special Instructions Will Be Listed Below (If Applicable).     If you need a refill on your cardiac medications before your next appointment, please call your pharmacy.

## 2018-03-31 ENCOUNTER — Telehealth: Payer: Self-pay | Admitting: Cardiovascular Disease

## 2018-03-31 MED ORDER — ROSUVASTATIN CALCIUM 5 MG PO TABS: 5.0000 mg | ORAL_TABLET | ORAL | 3 refills | Status: DC

## 2018-03-31 MED ORDER — SPIRONOLACTONE 25 MG PO TABS: 12.5000 mg | ORAL_TABLET | Freq: Every day | ORAL | 3 refills | Status: DC

## 2018-03-31 NOTE — Telephone Encounter (Signed)
New Message    *STAT* If patient is at the pharmacy, call can be transferred to refill team.   1. Which medications need to be refilled? (please list name of each medication and dose if known) rosuvastatin (CRESTOR) 5 MG tablet and spironolactone (ALDACTONE) 25 MG tablet  2. Which pharmacy/location (including street and city if local pharmacy) is medication to be sent to? [place]   3. Do they need a 30 day or 90 day supply? 30 day   Refill request was sent to the incorrect pharmacy. It went to [facility] and it should of went to [facility]. Patient indicates she wants the least amount of Crestor.

## 2018-04-02 ENCOUNTER — Other Ambulatory Visit (HOSPITAL_COMMUNITY): Payer: Medicare Other

## 2018-04-03 ENCOUNTER — Ambulatory Visit (HOSPITAL_COMMUNITY)
Admission: RE | Admit: 2018-04-03 | Discharge: 2018-04-03 | Disposition: A | Discharge status: Home / Self Care | Payer: Medicare Other | Source: Ambulatory Visit | Attending: Cardiovascular Disease | Admitting: Cardiovascular Disease

## 2018-04-03 DIAGNOSIS — Z853 Personal history of malignant neoplasm of breast: Secondary | ICD-10-CM | POA: Diagnosis not present

## 2018-04-03 DIAGNOSIS — I10 Essential (primary) hypertension: Secondary | ICD-10-CM | POA: Diagnosis not present

## 2018-04-03 DIAGNOSIS — I2510 Atherosclerotic heart disease of native coronary artery without angina pectoris: Secondary | ICD-10-CM | POA: Insufficient documentation

## 2018-04-03 DIAGNOSIS — I083 Combined rheumatic disorders of mitral, aortic and tricuspid valves: Secondary | ICD-10-CM | POA: Diagnosis not present

## 2018-04-03 DIAGNOSIS — R0600 Dyspnea, unspecified: Secondary | ICD-10-CM

## 2018-04-03 DIAGNOSIS — R0609 Other forms of dyspnea: Secondary | ICD-10-CM | POA: Diagnosis not present

## 2018-04-03 DIAGNOSIS — E785 Hyperlipidemia, unspecified: Secondary | ICD-10-CM | POA: Diagnosis not present

## 2018-04-03 NOTE — Progress Notes (Signed)
*  PRELIMINARY RESULTS* Echocardiogram 2D Echocardiogram has been performed.  Kara Woods 04/03/2018, 9:28 AM

## 2018-04-15 ENCOUNTER — Other Ambulatory Visit: Payer: Self-pay | Admitting: Cardiovascular Disease

## 2018-04-15 NOTE — Telephone Encounter (Signed)
Rx sent to pharmacy   

## 2018-04-21 ENCOUNTER — Encounter: Payer: Self-pay | Admitting: *Deleted

## 2018-06-16 DIAGNOSIS — H1820 Unspecified corneal edema: Secondary | ICD-10-CM | POA: Diagnosis not present

## 2018-06-16 DIAGNOSIS — H1851 Endothelial corneal dystrophy: Secondary | ICD-10-CM | POA: Diagnosis not present

## 2018-06-16 DIAGNOSIS — H524 Presbyopia: Secondary | ICD-10-CM | POA: Diagnosis not present

## 2018-06-16 DIAGNOSIS — H401131 Primary open-angle glaucoma, bilateral, mild stage: Secondary | ICD-10-CM | POA: Diagnosis not present

## 2018-06-16 DIAGNOSIS — H04123 Dry eye syndrome of bilateral lacrimal glands: Secondary | ICD-10-CM | POA: Diagnosis not present

## 2018-07-06 ENCOUNTER — Other Ambulatory Visit: Payer: Self-pay | Admitting: Cardiovascular Disease

## 2018-07-06 NOTE — Telephone Encounter (Signed)
Rx request sent to pharmacy.  

## 2018-07-09 ENCOUNTER — Encounter: Payer: Self-pay | Admitting: Cardiovascular Disease

## 2018-07-09 DIAGNOSIS — I10 Essential (primary) hypertension: Secondary | ICD-10-CM | POA: Diagnosis not present

## 2018-07-09 DIAGNOSIS — E782 Mixed hyperlipidemia: Secondary | ICD-10-CM | POA: Diagnosis not present

## 2018-07-13 DIAGNOSIS — I2510 Atherosclerotic heart disease of native coronary artery without angina pectoris: Secondary | ICD-10-CM | POA: Diagnosis not present

## 2018-07-13 DIAGNOSIS — I10 Essential (primary) hypertension: Secondary | ICD-10-CM | POA: Diagnosis not present

## 2018-07-13 DIAGNOSIS — Z6827 Body mass index (BMI) 27.0-27.9, adult: Secondary | ICD-10-CM | POA: Diagnosis not present

## 2018-07-13 DIAGNOSIS — E782 Mixed hyperlipidemia: Secondary | ICD-10-CM | POA: Diagnosis not present

## 2018-07-13 DIAGNOSIS — F411 Generalized anxiety disorder: Secondary | ICD-10-CM | POA: Diagnosis not present

## 2018-07-22 ENCOUNTER — Encounter: Payer: Self-pay | Admitting: Cardiovascular Disease

## 2018-07-22 ENCOUNTER — Ambulatory Visit (INDEPENDENT_AMBULATORY_CARE_PROVIDER_SITE_OTHER): Payer: Medicare Other | Admitting: Cardiovascular Disease

## 2018-07-22 VITALS — BP 105/72 | HR 73 | Ht 66.0 in | Wt 166.4 lb

## 2018-07-22 DIAGNOSIS — R0609 Other forms of dyspnea: Secondary | ICD-10-CM

## 2018-07-22 DIAGNOSIS — E785 Hyperlipidemia, unspecified: Secondary | ICD-10-CM

## 2018-07-22 DIAGNOSIS — I519 Heart disease, unspecified: Secondary | ICD-10-CM

## 2018-07-22 DIAGNOSIS — I2510 Atherosclerotic heart disease of native coronary artery without angina pectoris: Secondary | ICD-10-CM

## 2018-07-22 DIAGNOSIS — R0600 Dyspnea, unspecified: Secondary | ICD-10-CM

## 2018-07-22 MED ORDER — ROSUVASTATIN CALCIUM 5 MG PO TABS: 5.0000 mg | ORAL_TABLET | ORAL | 3 refills | Status: DC

## 2018-07-22 NOTE — Progress Notes (Signed)
Cardiology Office Note    Date:  07/23/2018   ID:  Kara, Woods 11/20/1933, MRN [id number]  PCP:  Celene Squibb, MD  Referring M.D.: Dr. Velvet Bathe Cardiologist:  Shelva Majestic, MD    History of Present Illness:  Kara Woods is a 84 y.o. female who presents for a 3 month follow-up cardiology evaluation   Kara Woods is a mother of my patient, who had suffered an out of hospital cardiac arrest years ago.  Kara Woods has a history of prior cardiac catheterization and from records [name] had undergone cardiac catheterization in 1998.  The patient states her coronaries were clean.  I do not have specifics of her catheterization findings.  Over the past 6 months, she has developed progressive decline in ability to be active and has noticed significant increasing exertionally precipitated shortness of breath.  She also has had some episodes of associated chest pressure with left arm radiation.  She had undergone an echo Doppler study on 10/24/2016 which showed an EF of 50% and suggested diffuse hypokinesis.  There was grade 1 diastolic dysfunction.  There was mild MR and mild LA dilatation.  She also had undergone pulmonary function testing was not significantly abnormal.  Her past history is notable for left breast CA and she is status post prior lumpectomy and radiation treatment.  She was cared for by Dr. Excell Seltzer.  She also is status post hysterectomy.  Remote history of cholecystectomy.  She has a history of retinal detachment.  When I saw her, due to progressive symptomatology with decline in exercise tolerance and increasing exertional dyspnea with a very strong family history of CAD.  Definitive cardiac catheterization was recommended.  This was performed by me on 11/04/2016 and revealed low normal global LV function with an EF of 50-55% with mild mid inferior focal hypocontractility.  There was multivessel CAD with multiple stenoses of 40-50% mid to distal LAD.  She had a normal  ramus intermediate vessel.  There was 80% focal stenosis in the left circumflex and 95% eccentric mid RCA stenosis followed by 50% narrowing.  She underwent initial PCI to the mid RCA with insertion of a 2.2512 mm Resolute Onyx stent postdilated to 2.5 mm with the 95% stenoses being reduced to 0%.  The following day, on 11/05/2016 she underwent staged intervention to the left circumflex flex artery with the 50 and 85% stenoses being reduced to 0% with insertion of a 2.7523 mm  Xience Alpine DES stent postdilated to 3.0.  Since her intervention, she has felt significantly improved.  She had mild dyspnea more attributed to air hunger and this typically is relieved when she takes a deep sigh.   She was started on Lipitor but did not tolerate this.  Subsequently, I recommended initiation of Crestor at 5 mg 2 times a week.  She took 2 doses and then felt she had similar symptoms and stopped taking this.  She denies any exertional chest tightness.  She was diagnosed with a left knee Baker's cyst.  She denies palpitations.    She underwent a one-year follow-up nuclear stress test on 10/09/2017 which remained low risk and showed an EF of 51% without scar or ischemia.    She was  evaluated at [facility] emergency room when she was brought there by family members with complaints of increasing shortness of breath andvague nonexertional chest tightness.  There was concern of whether or not Brilinta could responsible.  Laboratory was negative.  She was  not found to have evidence for pneumonia or CHF and had a negative d-dimer.  She denies any episodes of chest pain since her evaluation.  She continues to experience exertional dyspnea.  He has been on aspirin and Brilinta for antiplatelet therapy, Zetia 10 mg for hyperlipidemia although when previously I suggested a trial of Livalo which she never pursued.    I saw her in May 2019 and she underwent an echo Doppler study on Apr 03, 2018 which showed an EF of 50% with grade 1  diastolic dysfunction.  There was aortic sclerosis without stenosis, mild MR, and probable lipomatous hypertrophy of her atrial septum.  Although I again at that time had recommended institution of lipid-lowering therapy added to her Zetia with Crestor 5 mg weekly she never pursued this.  Her LV diastolic dysfunction and some shortness of breath I added spironolactone 12.5 mg.  The past several months she has felt improved.  She denies chest pain PND orthopnea.  She never started the rosuvastatin but was uncertain why. She denies bleeding.  She presents for reevaluation.  Past Medical History:  Diagnosis Date  . Anxiety   . Arthritis   . Blind left eye   . Breast cancer ([place])    left   . Colitis   . Coronary artery disease    a. 10/2016 Staged PCI: RCA 50p, 68m(2.25x12 Resolute Onyx DES), LCX 50p, 844m2.75x23 Xience Alpine DES). Residual LAD 50p/m, 4532m  . CMarland Kitchenstocele 10/11/2013  . Depression   . Diastolic dysfunction    a. 09/2016 Echo: EF 50%, diffuse hypokinesis, mild LVH, grade 1 diastolic dysfunction, mild mitral regurgitation, mildly dilated left atrium.  . GMarland KitchenRD (gastroesophageal reflux disease)    occasional Tums only  . HOH (hard of hearing)   . Pelvic relaxation 10/11/2013  . Proctitis    colonsocopy 2007, Canasa suppositories  . S/P endoscopy March 2009   mild erosive reflux esophagitis, Schatzki's ring, s/p dilation  . Schatzki's ring   . Shortness of breath    occ if anxiety  . Wears dentures    upper  . Wears glasses     Past Surgical History:  Procedure Laterality Date  . ANTERIOR AND POSTERIOR REPAIR N/A 05/17/2014   Procedure: ANTERIOR (CYSTOCELE) AND POSTERIOR REPAIR (RECTOCELE);  Surgeon: ScoReece PackerD;  [facility];  Service: Urology;  Laterality: N/A;  . BREAST LUMPECTOMY WITH NEEDLE LOCALIZATION AND AXILLARY SENTINEL LYMPH NODE BX Left 05/03/2013   Procedure: BREAST LUMPECTOMY WITH NEEDLE LOCALIZATION AND AXILLARY SENTINEL LYMPH NODE BX;   Surgeon: BenEdward JollyD;  [facility];  Laterality: Left;  . BREAST SURGERY Left 05/19/13  . CARDIAC CATHETERIZATION  1998  . CARDIAC CATHETERIZATION N/A 11/04/2016   Procedure: Left Heart Cath and Coronary Angiography;  Surgeon: ThoTroy SineD;  Location[facility];  Service: Cardiovascular;  Laterality: N/A;  . CARDIAC CATHETERIZATION N/A 11/05/2016   Procedure: Coronary Stent Intervention;  Surgeon: ThoTroy SineD;  Location: [facility];  Service: Cardiovascular;  Laterality: N/A;  . COLONOSCOPY  05/06/2006   Diffuse inflammatory changes of the rectal mucosa, consistent  with proctitis.  Otherwise, normal colon to terminal ileum  . CORONARY ANGIOPLASTY  11/04/2016  . CORONARY STENT PLACEMENT     Drug-eluting coronary artery stent, non-bioabsorbable-polymer-coated  . CYSTOSCOPY N/A 05/17/2014   Procedure: CYSTOSCOPY;  Surgeon: ScoReece PackerD;  [facility];  Service: Urology;  Laterality: N/A;  .  ESOPHAGOGASTRODUODENOSCOPY  02/09/2008   Schatzki ring status post dilation/Distal esophageal erosion consistent with mild erosive reflux  esophagitis, otherwise unremarkable esophagus, normal stomach, D1, D2.  . EYE SURGERY     lt cataract-implant  . EYE SURGERY     lt-lens repaced,l  . [place] SURGERY  1993  . RE-EXCISION OF BREAST LUMPECTOMY Left 05/19/2013   Procedure: RE-EXCISION OF BREAST LUMPECTOMY;  Surgeon: Edward Jolly, MD;  Location: [facility];  Service: General;  Laterality: Left;  . RETINAL DETACHMENT SURGERY  1978   Left  . VAGINAL HYSTERECTOMY Bilateral 05/17/2014   Procedure: HYSTERECTOMY VAGINAL with Bilateral Salpingo-Oophorectomy; Bladder cystotomy repair;  Surgeon: Marvene Staff, MD;  Location: [facility];  Service: Gynecology;  Laterality: Bilateral;  . VAGINAL PROLAPSE REPAIR N/A 05/17/2014   Procedure: VAGINAL VAULT PROLAPSE AND GRAFT;  Surgeon: Reece Packer, MD;  Location: [facility];  Service: Urology;  Laterality: N/A;    Current Medications: Outpatient Medications Prior to Visit  Medication Sig Dispense Refill  . acetaminophen (TYLENOL) 500 MG tablet Take 500-1,000 mg by mouth every 6 (six) hours as needed for mild pain.    Marland Kitchen aspirin EC 81 MG tablet Take 1 tablet (81 mg total) by mouth daily. 90 tablet 3  . BRILINTA 90 MG TABS tablet TAKE (1) TABLET BY MOUTH TWICE DAILY. 60 tablet 6  . carboxymethylcellulose (REFRESH PLUS) 0.5 % SOLN Place 3-4 drops into both eyes 3 (three) times daily as needed (dry eyes). Preservative free    . clorazepate (TRANXENE) 15 MG tablet Take 7.5 mg by mouth 2 (two) times daily as needed for anxiety.    Marland Kitchen ezetimibe (ZETIA) 10 MG tablet TAKE ONE TABLET BY MOUTH ONCE DAILY. 60 tablet 0  . metoprolol tartrate (LOPRESSOR) 25 MG tablet TAKE 1/2 TABLET BY MOUTH TWICE DAILY. 90 tablet 0  . nitroGLYCERIN (NITROSTAT) 0.4 MG SL tablet Place 1 tablet (0.4 mg total) under the tongue every 5 (five) minutes as needed. 25 tablet 3  . spironolactone (ALDACTONE) 25 MG tablet Take 12.5 mg by mouth daily.    . Travoprost, BAK Free, (TRAVATAN) 0.004 % SOLN ophthalmic solution Place 1 drop into the right eye at bedtime.     . rosuvastatin (CRESTOR) 5 MG tablet Take 5 mg by mouth daily.    . rosuvastatin (CRESTOR) 5 MG tablet Take 1 tablet (5 mg total) by mouth once a week. 15 tablet 3  . spironolactone (ALDACTONE) 25 MG tablet Take 0.5 tablets (12.5 mg total) by mouth daily. 45 tablet 3   No facility-administered medications prior to visit.      Allergies:   Contrast media [iodinated diagnostic agents]; Lipitor [atorvastatin]; and Sulfonamide derivatives   Social History   Socioeconomic History  . Marital status: Widowed    Spouse name: Not on file  . Number of children: Not on file  . Years of education: Not on file  . Highest education level: Not on file  Occupational History  . Occupation: Retired  Scientific laboratory technician  .  Financial resource strain: Not on file  . Food insecurity:    Worry: Not on file    Inability: Not on file  . Transportation needs:    Medical: Not on file    Non-medical: Not on file  Tobacco Use  . Smoking status: Former Smoker    Packs/day: 1.00    Types: Cigarettes    Last attempt to quit: 04/28/1990    Years since quitting: 28.2  . Smokeless  tobacco: Never Used  . Tobacco comment: quit 19 yrs ago  Substance and Sexual Activity  . Alcohol use: Yes    Comment: occ  . Drug use: No  . Sexual activity: Not Currently    Birth control/protection: Post-menopausal  Lifestyle  . Physical activity:    Days per week: Not on file    Minutes per session: Not on file  . Stress: Not on file  Relationships  . Social connections:    Talks on phone: Not on file    Gets together: Not on file    Attends religious service: Not on file    Active member of club or organization: Not on file    Attends meetings of clubs or organizations: Not on file    Relationship status: Not on file  Other Topics Concern  . Not on file  Social History Narrative  . Not on file     Family History:  The patient's family history includes Cancer in her brother; Multiple myeloma in her daughter.  I mother died and had a myocardial infarction.  Her father also had suffered a myocardial infarction.  She had 3 sisters who also had myocardial infarction and also one of her brothers also had suffered a myocardial infarction.  ROS General: Negative; No fevers, chills, or night sweats;  HEENT: History of retinal detachment, sinus congestion, difficulty swallowing Pulmonary: Negative; No cough, wheezing, shortness of breath, hemoptysis Cardiovascular: see HPI GI: Positive for occasional loose stools GU: Negative; No dysuria, hematuria, or difficulty voiding Musculoskeletal: Positive for left Baker's cyst Hematologic/Oncology: Negative; no easy bruising, bleeding Endocrine: Negative; no heat/cold intolerance; no  diabetes Neuro: Negative; no changes in balance, headaches Skin: Negative; No rashes or skin lesions Psychiatric: Negative; No behavioral problems, depression Sleep: Negative; No snoring, daytime sleepiness, hypersomnolence, bruxism, restless legs, hypnogognic hallucinations, no cataplexy Other comprehensive 14 point system review is negative.   PHYSICAL EXAM:   VS:  BP 105/72   Pulse 73   Ht 5' 6"  (1.676 m)   Wt 166 lb 6.4 oz (75.5 kg)   BMI 26.86 kg/m     Repeat blood pressure by me was 110/70  Wt Readings from Last 3 Encounters:  07/22/18 166 lb 6.4 oz (75.5 kg)  03/30/18 167 lb 3.2 oz (75.8 kg)  03/03/18 165 lb (74.8 kg)    General: Alert, oriented, no distress.  Skin: normal turgor, no rashes, warm and dry HEENT: Normocephalic, atraumatic. Pupils equal round and reactive to light; sclera anicteric; extraocular muscles intact;  Nose without nasal septal hypertrophy Mouth/Parynx benign; Mallinpatti scale 3 Neck: No JVD, no carotid bruits; normal carotid upstroke Lungs: clear to ausculatation and percussion; no wheezing or rales Chest wall: without tenderness to palpitation Heart: PMI not displaced, RRR, s1 s2 normal, 1/6 systolic murmur, no diastolic murmur, no rubs, gallops, thrills, or heaves Abdomen: soft, nontender; no hepatosplenomehaly, BS+; abdominal aorta nontender and not dilated by palpation. Back: no CVA tenderness Pulses 2+ Musculoskeletal: full range of motion, normal strength, no joint deformities Extremities: no clubbing cyanosis or edema, Homan's sign negative  Neurologic: grossly nonfocal; Cranial nerves grossly wnl Psychologic: Normal mood and affect   Studies/Labs Reviewed:   ECG (independently read by me): Sinus rhythm at 73 with sinus arrhythmia.  Normal intervals.  May 2019 ECG (independently read by me): Normal sinus rhythm at 66 bpm.  No ectopy.  No ST segment changes.  December 2018 ECG (independently read by me): Normal sinus rhythm at 77  bpm.  Nonspecific ST  changes.  June 2018 ECG (independently read by me): Sinus bradycardia 56 bpm.  Normal intervals.  No significant ST-T changes.  March 2018 ECG (independently read by me): Sinus bradycardia 54 bpm.  Normal intervals.  No ST segment changes.  December 2017 ECG (independently read by me): Normal sinus rhythm at 74 bpm.  No ectopy.  QTc interval 455 ms.  Recent Labs: BMP Latest Ref Rng & Units 03/03/2018 06/24/2017 11/19/2016  Glucose 65 - 99 mg/dL 137(H) 96 95  BUN 6 - 20 mg/dL 8 12 12   Creatinine 0.44 - 1.00 mg/dL 0.79 0.77 0.85  Sodium 135 - 145 mmol/L 138 140 139  Potassium 3.5 - 5.1 mmol/L 3.7 4.8 4.6  Chloride 101 - 111 mmol/L 105 106 105  CO2 22 - 32 mmol/L 24 28 25   Calcium 8.9 - 10.3 mg/dL 9.2 8.9 9.1     Hepatic Function Latest Ref Rng & Units 03/03/2018 06/24/2017 11/19/2016  Total Protein 6.5 - 8.1 g/dL 7.3 7.2 6.8  Albumin 3.5 - 5.0 g/dL 3.7 3.7 3.7  AST 15 - 41 U/L 18 24 19   ALT 14 - 54 U/L 11(L) 15 14  Alk Phosphatase 38 - 126 U/L 61 58 57  Total Bilirubin 0.3 - 1.2 mg/dL 0.6 0.7 0.6  Bilirubin, Direct 0.1 - 0.5 mg/dL <0.1(L) - 0.1    CBC Latest Ref Rng & Units 03/03/2018 06/24/2017 11/19/2016  WBC 4.0 - 10.5 K/uL 9.9 8.0 9.1  Hemoglobin 12.0 - 15.0 g/dL 12.9 12.4 12.7  Hematocrit 36.0 - 46.0 % 39.6 37.6 39.0  Platelets 150 - 400 K/uL 255 269 236   Lab Results  Component Value Date   MCV 93.0 03/03/2018   MCV 93.3 06/24/2017   MCV 93.8 11/19/2016   No results found for: TSH No results found for: HGBA1C   BNP    Component Value Date/Time   BNP 51.0 09/01/2016 1012    ProBNP No results found for: PROBNP   Lipid Panel     Component Value Date/Time   CHOL 227 (H) 10/31/2016 1245   TRIG 164 (H) 10/31/2016 1245   HDL 47 (L) 10/31/2016 1245   CHOLHDL 4.8 10/31/2016 1245   VLDL 33 (H) 10/31/2016 1245   LDLCALC 147 (H) 10/31/2016 1245     RADIOLOGY: No results found.   Additional studies/ records that were reviewed today include:     At her last office visit I reviewed the office records of [name].  I reviewed her echo Doppler study and pulmonary function tests.    ASSESSMENT:    1. CAD in native artery   2. Hyperlipidemia with target LDL less than 70   3. Exertional dyspnea   4. Left ventricular diastolic dysfunction     PLAN:  Kara Woods is an 84 year old Caucasian female who had developed progressive decline in exercise tolerance with significant increasing shortness of breath with minimal activity associated with chest pressure and left arm radiation.  In 1998, reportedly a cardiac catheterization had shown normal coronary arteries.  An echo Doppler study from November 2017 showed an EF of 50% with diffuse hypokinesis.  There was mild MR and mild dilatation of her left atrium. Her pulmonary function studies did not identified significant pulmonary disability in the etiology of her dyspnea.  With her very strong family history of myocardial infarctions in multiple family members, as well as remote tobacco history and progressive changes in symptomatology recommended repeat cardiac catheterization which was done on November 04, 2016.  This  revealed severe multivessel CAD as noted above and she underwent successful stenting of her RCA as well as her left circumflex vessels in a staged procedure.  She has been without recurrent anginal symptomatology.  A nuclear perfusion study from 10/09/2017 continued to show normal perfusion, without scar or ischemia.  EF was 51%.  She has been intolerant to numerous statins.  Only, her blood pressure is stable on spironolactone 12.5 mg daily in addition to metoprolol 12.5 mg twice a day.  With the addition of spironolactone, her shortness of breath has improved and will be benefiting her diastolic dysfunction.  She has been on Zetia 10 mg daily for hyperlipidemia.  I reviewed most recent blood work by Dr. Nevada Crane from July 09, 2018.  Her LDL had improved and was now 111.  However,  I have recommended that she at least try Crestor 5 mg weekly with her Zetia to see if this ultimately can reduce her LDL to less than 70.  He is not having any recurrent anginal symptoms.  Continues to be on dual antiplatelet therapy and is tolerating this well without bleeding.  We will see Dr. Nevada Crane in follow-up evaluation.  I will see her in 6 months for follow-up cardiology evaluation.   Medication Adjustments/Labs and Tests Ordered: Current medicines are reviewed at length with the patient today.  Concerns regarding medicines are outlined above.  Medication changes, Labs and Tests ordered today are listed in the Patient Instructions below.  Patient Instructions  Medication Instructions:  Take rosuvastatin (Crestor) 5 mg once a week  Follow-Up: Your physician wants you to follow-up in: 6 months with [name].  You will receive a reminder letter in the mail two months in advance. If you don't receive a letter, please call our office to schedule the follow-up appointment.   Any Other Special Instructions Will Be Listed Below (If Applicable).     If you need a refill on your cardiac medications before your next appointment, please call your pharmacy.      Signed, Shelva Majestic, MD, [place] 07/23/2018 7:47 PM    [facility] [address], [place], [place], [place]  [address] Phone: [phone]

## 2018-07-22 NOTE — Patient Instructions (Signed)
Medication Instructions:  Take rosuvastatin (Crestor) 5 mg once a week  Follow-Up: Your physician wants you to follow-up in: 6 months with [name].  You will receive a reminder letter in the mail two months in advance. If you don't receive a letter, please call our office to schedule the follow-up appointment.   Any Other Special Instructions Will Be Listed Below (If Applicable).     If you need a refill on your cardiac medications before your next appointment, please call your pharmacy.

## 2018-07-23 ENCOUNTER — Encounter: Payer: Self-pay | Admitting: Cardiovascular Disease

## 2018-08-12 DIAGNOSIS — J060 Acute laryngopharyngitis: Secondary | ICD-10-CM | POA: Diagnosis not present

## 2018-08-12 DIAGNOSIS — Z6827 Body mass index (BMI) 27.0-27.9, adult: Secondary | ICD-10-CM | POA: Diagnosis not present

## 2018-08-12 DIAGNOSIS — J309 Allergic rhinitis, unspecified: Secondary | ICD-10-CM | POA: Diagnosis not present

## 2018-08-12 DIAGNOSIS — R05 Cough: Secondary | ICD-10-CM | POA: Diagnosis not present

## 2018-08-17 DIAGNOSIS — Z23 Encounter for immunization: Secondary | ICD-10-CM | POA: Diagnosis not present

## 2018-08-21 ENCOUNTER — Other Ambulatory Visit: Payer: Self-pay | Admitting: Pharmacy Technician

## 2018-08-21 ENCOUNTER — Other Ambulatory Visit: Payer: Self-pay | Admitting: Pharmacist

## 2018-08-21 NOTE — Patient Outreach (Addendum)
[facility]) Care Management  08/21/2018  Kara Woods [birth year] [id number]   Incoming call from [name]. HIPAA identifiers verified and verbal consent received. Ms. Connaughton reports that she received my contact information from her niece and requests my help with medication assistance.   Medication Assistance  Patient reports that she is currently in the coverage gap of her insurance and having particular difficulty with affording her Travatan Z eye drops and Brilinta.   Reports income level is too high for her to qualify for extra help through Social Security.  Patient reports that she meets the income requirements (and out of pocket spend requirement for [employer]) for all three medication assistance programs  Reports that she has plenty of her medications, but will request further samples of Travatan Z and Brilinta from her providers. Reports that her Travatan Z is prescribed by her eye doctor, Dr. Jerline Pain at [facility] in [place].  Denies any further medication questions/concerns at this time. Confirms having saved my phone number.   PLAN  1) Will send an InBasket message to [place] asking that she assist the patient and providers with completing patient assistance applications for Travatan Z through [employer] and [place] through [employer].   2) Ms. Degraffenreid to obtain her out of pocket expense report for the calendar year from her pharmacy.  3) Ms. Adler to continue to contact her eye doctor and Cardiologist for further medication samples.  4) Will follow with Caryl Pina as she assists the patient with these applications.  Harlow Asa, PharmD, [place] Management [phone]

## 2018-08-21 NOTE — Patient Outreach (Signed)
Received [place] patient assistance referral from [place] for [place]. Prepared patient portion to be mailed. Faxed provider portion to Dr. Vernona Rieger and Dr. Shelva Majestic respectively.  Will followup with patient in 7-10 business days to confirm application has been received.   Cailee Blanke P. Marlane Hirschmann, [place] Management [phone]

## 2018-08-26 ENCOUNTER — Telehealth: Payer: Self-pay | Admitting: *Deleted

## 2018-08-26 NOTE — Telephone Encounter (Signed)
Patient assistance forms for Brilinta completed and returned to [place] with [facility].

## 2018-08-31 ENCOUNTER — Other Ambulatory Visit: Payer: Self-pay | Admitting: Pharmacy Technician

## 2018-08-31 NOTE — Patient Outreach (Signed)
[facility]) Care Management  08/31/2018  TRENYCE LOERA 03-02-1933 [id number]  Unsuccessful call attempt to patient Kara Woods. HIPAA compliant message left. Called to confirm patient assistance applications had been received.  Will make 2nd call attempt in 2-3 business days if call has not been returned.  [place] Austad P. Renaye Janicki, [place] Management [phone]

## 2018-09-01 ENCOUNTER — Other Ambulatory Visit: Payer: Self-pay | Admitting: Pharmacy Technician

## 2018-09-01 NOTE — Patient Outreach (Signed)
[facility]) Care Management  09/01/2018  Kara Woods December 30, 1932 [id number]   Unsuccessful call attempt to patient Kara Woods. HIPAA compliant voicemail left. Called to confirm patient assistance applications had been received.  Will followup with 3rd call attempt in 5-7 business days if call has not been returned.  Kara Touchton P. Kara Woods, [place] Management [phone]

## 2018-09-03 ENCOUNTER — Other Ambulatory Visit: Payer: Self-pay | Admitting: Pharmacy Technician

## 2018-09-03 NOTE — Patient Outreach (Signed)
[facility]) Care Management  09/03/2018  MINETTE Kara Woods Mar 26, 1933 [id number]  Follow up call to Dr. Melodye Ped office in regards to [employer] patient assistance application for Travatan Z. Clarification was needed on the ICD10 code. Spoke to [place] who states the ICD10 code is H40.1131.  Once patient portion of application has been received will submit completed application to [employer] patient assistance.  Keimani Laufer P. Deolinda Frid, [place] Management [phone]

## 2018-09-04 ENCOUNTER — Other Ambulatory Visit: Payer: Self-pay | Admitting: Pharmacy Technician

## 2018-09-04 ENCOUNTER — Other Ambulatory Visit: Payer: Self-pay | Admitting: Pharmacist

## 2018-09-04 NOTE — Patient Outreach (Signed)
[facility]) Care Management  09/04/2018  Kara Woods 1933-11-21 [id number]  Received in basket note from [place] concerning patient assistance referral for Ms. Kimbrough for Travatan Z [employer]) and [employer] (Brilinta). Pharmacist asked that the patient assistance applications be re mailed to the patient as patient had misplaced some of the pages.  Prepared patient portion to be mailed.  Will followup with patient in 7-10 business days to confirm application has been received.  Sola Margolis P. Pheonix Wisby, [place] Management [phone]

## 2018-09-04 NOTE — Patient Outreach (Signed)
[facility] [facility]) Care Management  09/04/2018  DEMITRA DANLEY 17-Sep-1933 [id number]  Successful call to Ms. Edwinna Areola, HIPAA identifiers verified. Followup call to inquire if patient had received, completed and mailed back the patient assistance applications for Brilinta & Travatan Z. Patient stated that she doesn't have the information that we are requesting for proof of income. Explained to patient that since she says she doesn't file taxes that we could use the credits only from her last 2 months of bank statements. Patient stated that she could obtain that but it probably wouldn't be until "possilby" next week as she had a lot of things going on. Also confirmed with patient the other documents that would need to be sent in as well. Patient confirmed understanding and would call back if she had further questions or concerns and phone number was verified.   Will route note to [place] for case closure due to lack of engagement once patient was able to be reached.  Lizbett Garciagarcia P. Symiah Nowotny, [place] Management [phone]

## 2018-09-04 NOTE — Patient Outreach (Signed)
[facility]) Care Management  09/04/2018  EDDY TERMINE 25-Jan-1933 [id number]   Follow up call to Kara Woods regarding medication assistance to determine whether or not patient would like to proceed with patient assistance applications for Brilinta & Travatan Z. HIPAA identifiers verified and verbal consent received.  Ms. Rivere states that she has been having a difficult time recently due to the poor health of her daughters. States that she would like assistance with applying for these programs and asks for these applications to be sent to her again. Patient apologizes for the confusion and expresses appreciation for our continued help.  PLAN  1) Place a care coordination call to Prosperity Simcox to ask that she resend these applications to the patient, as requested.  Harlow Asa, PharmD, [place] Management [phone]

## 2018-09-09 ENCOUNTER — Other Ambulatory Visit: Payer: Self-pay | Admitting: Pharmacist

## 2018-09-09 NOTE — Patient Outreach (Addendum)
[facility] [facility]) Care Management  09/09/2018  Kara Woods 05/06/1933 [id number]   Incoming call from Kara Woods regarding medication assistance. HIPAA identifiers verified and verbal consent received.  Kara Woods reports that she has completed the two patient assistance applications that she received in the mail from [place], but cannot find the envelope to mail them back in. Patient requests the return mailing address, which I provide to her.   Patient reports that she will be going to the bank tomorrow to obtain the final documentation that she needed to provide and will then mail all of the paperwork back to [place] tomorrow.  Patient lets me know that she has several weeks of each medication remaining and will continue to ask her providers about availability of samples.  Patient denies further medication questions/concerns at this time. Confirm that patient has my phone number and the phone number for [place] [facility].  Harlow Asa, PharmD, [place] [facility] [phone]

## 2018-09-14 ENCOUNTER — Other Ambulatory Visit: Payer: Self-pay | Admitting: Pharmacy Technician

## 2018-09-14 ENCOUNTER — Telehealth: Payer: Self-pay | Admitting: Cardiovascular Disease

## 2018-09-14 DIAGNOSIS — I2510 Atherosclerotic heart disease of native coronary artery without angina pectoris: Secondary | ICD-10-CM

## 2018-09-14 DIAGNOSIS — Z7902 Long term (current) use of antithrombotics/antiplatelets: Secondary | ICD-10-CM

## 2018-09-14 NOTE — Patient Outreach (Signed)
[place]) Care Management  09/14/2018  Kara Woods 1933/08/26 [id number]  Successful call to patient, HIPAA identifiers verified. Follow up call to inquire if patient has had the opportunity to fill out pastient assistance applications for Brilinta & Travatan Z.   Patient states that she had consulted with her children and that she was not going to participate in the patient assistance. She was going to continue to fill her prescriptions as she had been.   I confirmed that patient had our numbers and asked her to call us in the future if we could be of any assistance to her.   Will route note to [place] for case closure.  Josip Merolla P. Allexa Acoff, [place] Management [phone]

## 2018-09-14 NOTE — Telephone Encounter (Signed)
Follow up  ° ° °Patient is returning your call. °

## 2018-09-14 NOTE — Telephone Encounter (Signed)
If she is to switch to clopidogrel, check P2Y12 blood test 1 week after institution to make certain she is Plavix responsive

## 2018-09-14 NOTE — Telephone Encounter (Signed)
LMTCB to discuss cost/patient assistance application refusal (see below)  Per chart review, [facility] pharmacy has been in contact with patient about patient assistance for Brilinta but decided not to pursue - see phone note from [facility] from today.

## 2018-09-14 NOTE — Telephone Encounter (Signed)
New Message   Pt c/o medication issue:  1. Name of Medication:BRILINTA 90 MG TABS tablet  2. How are you currently taking this medication (dosage and times per day)?  TAKE (1) TABLET BY MOUTH TWICE DAILY  3. Are you having a reaction (difficulty breathing--STAT)? No  4. What is your medication issue? Pt states that the medication is too costly and wants to be on Plavix

## 2018-09-14 NOTE — Telephone Encounter (Signed)
Spoke with daughter who reports that patient did not wish to pursue patient assistance as she was told that if she received it from drug company, it may be 2-3 days late and this very much worried the patient. Patient would prefer to be switched to clopidogrel instead.   Advised would have to route to MD to address this request

## 2018-09-16 ENCOUNTER — Other Ambulatory Visit: Payer: Self-pay | Admitting: Pharmacist

## 2018-09-16 MED ORDER — CLOPIDOGREL BISULFATE 75 MG PO TABS: 75.0000 mg | ORAL_TABLET | Freq: Every day | ORAL | 3 refills | Status: DC

## 2018-09-16 NOTE — Telephone Encounter (Signed)
Left message to call back  

## 2018-09-16 NOTE — Telephone Encounter (Signed)
° °  Please return call to patients daughter 

## 2018-09-16 NOTE — Telephone Encounter (Signed)
Left message to call back  Operator-please call me when daughter calls back [phone]

## 2018-09-16 NOTE — Telephone Encounter (Signed)
Follow Up:    Daughter calling back to see what was decided about her medicine,

## 2018-09-16 NOTE — Patient Outreach (Signed)
[facility] [facility]) Care Management  09/16/2018  [name] 1933-06-27 [id number]   Receive an Suanne Marker message from [place] Simcox letting me know that she spoke with the patient and Kara Woods stated "that she had consulted with her children and that she was not going to participate in the patient assistance. She was going to continue to fill her prescriptions as she had been."  Will close pharmacy episode and patient to [place] [facility] at this time.  Sharee Pimple states that she has confirmed that the patient has our phone numbers. Will send a closure letter to patient's PCP.  Harlow Asa, PharmD, [place] [facility] [phone]

## 2018-09-16 NOTE — Telephone Encounter (Signed)
Spoke to daughter-aware of [name] recommendations.   Patient will plan to use the rest of the Brilinta that she has then will start Plavix daily.   Per pharmD, no loading dose needed.    rx sent to pharmacy and lab order placed.   Daughter is aware to have this completed at [facility] 1 week after starting Plavix.    Advised to call with further questions or concerns.

## 2018-09-28 ENCOUNTER — Other Ambulatory Visit: Payer: Self-pay | Admitting: *Deleted

## 2018-09-28 ENCOUNTER — Telehealth: Payer: Self-pay | Admitting: Cardiovascular Disease

## 2018-09-28 DIAGNOSIS — I2510 Atherosclerotic heart disease of native coronary artery without angina pectoris: Secondary | ICD-10-CM

## 2018-09-28 DIAGNOSIS — Z7902 Long term (current) use of antithrombotics/antiplatelets: Secondary | ICD-10-CM

## 2018-09-28 NOTE — Telephone Encounter (Signed)
New Message:   Patient daughter calling about a lab appt. She called the hospital they have no order.

## 2018-09-28 NOTE — Telephone Encounter (Signed)
Called [facility]-order not visible to them.   Request to be faxed to them at [phone].  Order faxed.    Spoke to daughter and made aware.

## 2018-09-29 ENCOUNTER — Other Ambulatory Visit: Payer: Self-pay | Admitting: Cardiovascular Disease

## 2018-09-29 NOTE — Telephone Encounter (Signed)
Rx request sent to pharmacy.  

## 2018-10-01 ENCOUNTER — Telehealth: Payer: Self-pay | Admitting: Cardiovascular Disease

## 2018-10-01 NOTE — Telephone Encounter (Signed)
Spoke to patient-she had questions in regards to lab work to be drawn next week at [facility] (P2Y12).     All questions answered and she verbalized understanding.  Advised to call back with further questions or concerns.

## 2018-10-01 NOTE — Telephone Encounter (Signed)
  Wanted to speak with someone about a procedure she is supposed to have on Monday at [facility]. She isn't sure what it is but thinks it is concerning blood work.

## 2018-10-05 ENCOUNTER — Other Ambulatory Visit (HOSPITAL_COMMUNITY)
Admission: RE | Admit: 2018-10-05 | Discharge: 2018-10-05 | Disposition: A | Discharge status: Home / Self Care | Payer: Medicare Other | Source: Ambulatory Visit | Attending: Cardiovascular Disease | Admitting: Cardiovascular Disease

## 2018-10-05 DIAGNOSIS — I2510 Atherosclerotic heart disease of native coronary artery without angina pectoris: Secondary | ICD-10-CM | POA: Insufficient documentation

## 2018-10-05 DIAGNOSIS — Z7902 Long term (current) use of antithrombotics/antiplatelets: Secondary | ICD-10-CM | POA: Insufficient documentation

## 2018-10-05 LAB — PLATELET INHIBITION P2Y12: Platelet Function  P2Y12: 113 [PRU] — ABNORMAL LOW (ref 194–418)

## 2018-10-08 ENCOUNTER — Telehealth: Payer: Self-pay | Admitting: Cardiovascular Disease

## 2018-10-08 NOTE — Telephone Encounter (Signed)
Labs has resulted. Pending MD review

## 2018-10-08 NOTE — Telephone Encounter (Signed)
New message:       Pt is calling to see if we have received her lab work from the hospital to see if she can stay Plavix instead of being on the [place].

## 2018-10-08 NOTE — Telephone Encounter (Signed)
Returned call to patient, prelim results given but aware [name] needs to review and will call with further recommendations.  ROV scheduled for February

## 2018-10-11 NOTE — Telephone Encounter (Signed)
Patient is Plavix responsive; okay to stay on Plavix

## 2018-10-12 NOTE — Telephone Encounter (Signed)
Patient called with MD results

## 2018-10-30 ENCOUNTER — Telehealth: Payer: Self-pay | Admitting: Cardiovascular Disease

## 2018-10-30 NOTE — Telephone Encounter (Signed)
Please have patient d/c clopidogrel and start aspirin 81 mg daily. Forward to [name] and [facility] for review

## 2018-10-30 NOTE — Telephone Encounter (Signed)
  Patient recently was switched from Brilinta to Plavix and she is experiencing diarrhea daily since taking the Plavix. She does not want to go back on Brilinta due to the cost. What can she do?

## 2018-10-30 NOTE — Telephone Encounter (Signed)
Routed to pharmD

## 2018-11-03 NOTE — Telephone Encounter (Signed)
Advised pt the recommendations re: her Plavix and to start ASA 81mg .. Will forward message to [name].

## 2018-11-03 NOTE — Telephone Encounter (Signed)
Agree 

## 2018-11-29 DIAGNOSIS — R0602 Shortness of breath: Secondary | ICD-10-CM | POA: Diagnosis not present

## 2018-11-29 DIAGNOSIS — Z7902 Long term (current) use of antithrombotics/antiplatelets: Secondary | ICD-10-CM | POA: Diagnosis not present

## 2018-11-29 DIAGNOSIS — G459 Transient cerebral ischemic attack, unspecified: Secondary | ICD-10-CM | POA: Diagnosis not present

## 2018-11-29 DIAGNOSIS — R42 Dizziness and giddiness: Secondary | ICD-10-CM | POA: Diagnosis not present

## 2018-11-29 DIAGNOSIS — R531 Weakness: Secondary | ICD-10-CM | POA: Diagnosis not present

## 2018-11-29 DIAGNOSIS — Z882 Allergy status to sulfonamides status: Secondary | ICD-10-CM | POA: Diagnosis not present

## 2018-11-29 DIAGNOSIS — Z87891 Personal history of nicotine dependence: Secondary | ICD-10-CM | POA: Diagnosis not present

## 2018-11-29 DIAGNOSIS — R0689 Other abnormalities of breathing: Secondary | ICD-10-CM | POA: Diagnosis not present

## 2018-11-29 DIAGNOSIS — Z7982 Long term (current) use of aspirin: Secondary | ICD-10-CM | POA: Diagnosis not present

## 2018-11-29 DIAGNOSIS — R197 Diarrhea, unspecified: Secondary | ICD-10-CM | POA: Diagnosis not present

## 2018-11-30 ENCOUNTER — Telehealth: Payer: Self-pay | Admitting: Cardiovascular Disease

## 2018-11-30 NOTE — Telephone Encounter (Signed)
New Message:    Patient daughter calling concerning some medications. Her mother was having some issue on Saturday. Patient is still not feeling well. Please call patient daughter at [phone]

## 2018-11-30 NOTE — Telephone Encounter (Signed)
Spoke with patient's daughter who reports patient was changed from Brilinta >> Plavix >> ASA on 12/6 d/t diarrhea. She reports the diarrhea really has not stopped. She feels tired, dizzy and has headaches.   Her main concerns is that Saturday 1/4, patient was with her eating lunch. She became white, glassy eye, couldn't talk well. Daughter suggest ED visit but patient refused. On Sunday 1/5, patient didn't feel better. Per daughter, she was disoriented, dizzy, left arm pain. EMS was called and patient went to [facility]. Per daughter, blood work and CT scan were OK but they wanted to do a CT with contrast which patient refused d/t contrast allergy (didn't want solumedrol per daughter) and couldn't do MRI d/t eye lens implant. Daughter states ED was concerned about mini stroke but since these additional tests could not be performed, they were released.   Spoke with [name] about these concerns. He advised that patient f/up with PCP initially and cardiology if needed to follow up on these symptoms.   This was relayed to daughter who voiced understanding. She asked if this could be from a change from Plavix to ASA and I notified her that any med changes would be pending any additional work up for stroke.

## 2018-12-01 DIAGNOSIS — Z6827 Body mass index (BMI) 27.0-27.9, adult: Secondary | ICD-10-CM | POA: Diagnosis not present

## 2018-12-01 DIAGNOSIS — F5089 Other specified eating disorder: Secondary | ICD-10-CM | POA: Diagnosis not present

## 2018-12-01 DIAGNOSIS — R05 Cough: Secondary | ICD-10-CM | POA: Diagnosis not present

## 2018-12-01 DIAGNOSIS — E782 Mixed hyperlipidemia: Secondary | ICD-10-CM | POA: Diagnosis not present

## 2018-12-01 DIAGNOSIS — R4181 Age-related cognitive decline: Secondary | ICD-10-CM | POA: Diagnosis not present

## 2018-12-01 DIAGNOSIS — I10 Essential (primary) hypertension: Secondary | ICD-10-CM | POA: Diagnosis not present

## 2018-12-01 DIAGNOSIS — F411 Generalized anxiety disorder: Secondary | ICD-10-CM | POA: Diagnosis not present

## 2018-12-01 DIAGNOSIS — E559 Vitamin D deficiency, unspecified: Secondary | ICD-10-CM | POA: Diagnosis not present

## 2018-12-01 DIAGNOSIS — R5383 Other fatigue: Secondary | ICD-10-CM | POA: Diagnosis not present

## 2018-12-01 DIAGNOSIS — G459 Transient cerebral ischemic attack, unspecified: Secondary | ICD-10-CM | POA: Diagnosis not present

## 2018-12-01 DIAGNOSIS — R531 Weakness: Secondary | ICD-10-CM | POA: Diagnosis not present

## 2018-12-01 DIAGNOSIS — J029 Acute pharyngitis, unspecified: Secondary | ICD-10-CM | POA: Diagnosis not present

## 2018-12-01 DIAGNOSIS — K219 Gastro-esophageal reflux disease without esophagitis: Secondary | ICD-10-CM | POA: Diagnosis not present

## 2018-12-01 DIAGNOSIS — J060 Acute laryngopharyngitis: Secondary | ICD-10-CM | POA: Diagnosis not present

## 2018-12-01 DIAGNOSIS — I2510 Atherosclerotic heart disease of native coronary artery without angina pectoris: Secondary | ICD-10-CM | POA: Diagnosis not present

## 2018-12-01 DIAGNOSIS — J309 Allergic rhinitis, unspecified: Secondary | ICD-10-CM | POA: Diagnosis not present

## 2018-12-01 DIAGNOSIS — Z6826 Body mass index (BMI) 26.0-26.9, adult: Secondary | ICD-10-CM | POA: Diagnosis not present

## 2018-12-02 ENCOUNTER — Other Ambulatory Visit (HOSPITAL_COMMUNITY): Payer: Self-pay | Admitting: Adult Health Nurse Practitioner

## 2018-12-02 ENCOUNTER — Other Ambulatory Visit: Payer: Self-pay | Admitting: Adult Health Nurse Practitioner

## 2018-12-02 DIAGNOSIS — R531 Weakness: Secondary | ICD-10-CM

## 2018-12-10 ENCOUNTER — Ambulatory Visit (HOSPITAL_COMMUNITY)
Admission: RE | Admit: 2018-12-10 | Discharge: 2018-12-10 | Disposition: A | Discharge status: Home / Self Care | Payer: Medicare Other | Source: Ambulatory Visit | Attending: Adult Health Nurse Practitioner | Admitting: Adult Health Nurse Practitioner

## 2018-12-10 DIAGNOSIS — R531 Weakness: Secondary | ICD-10-CM | POA: Insufficient documentation

## 2018-12-10 DIAGNOSIS — R471 Dysarthria and anarthria: Secondary | ICD-10-CM | POA: Diagnosis not present

## 2018-12-10 IMAGING — CT CT HEAD W/O CM
3 series · 16 of 47 positions shown, 19 images · non-contrast
Comparison: [DATE]

CLINICAL DATA: Lower extremity weakness. Recent episode of
dysarthria

EXAM:
CT HEAD WITHOUT CONTRAST
TECHNIQUE: Contiguous axial images were obtained from the base of the skull
through the vertex without intravenous contrast.

[Series 2: head wo · axial · 0.42mm/px · z∈[+56,+191]mm · 10 of 33 slices shown, 13 images]
[im 3/33  brain]
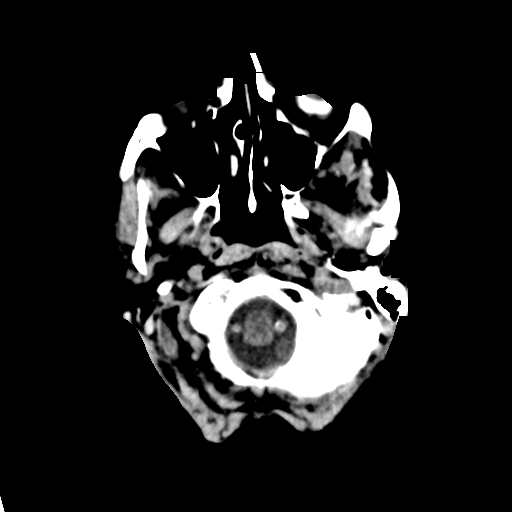
[im 3/33  bone]
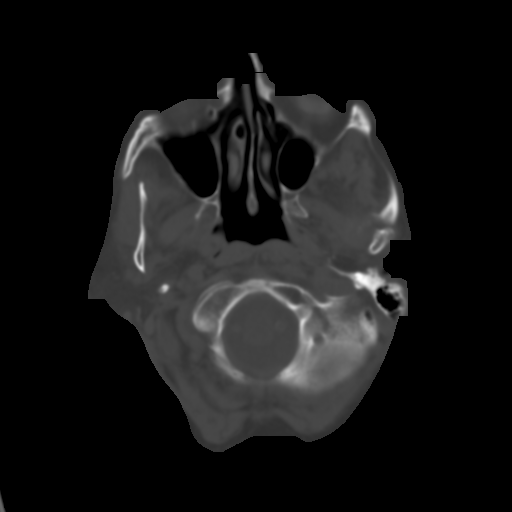
[im 6/33  brain]
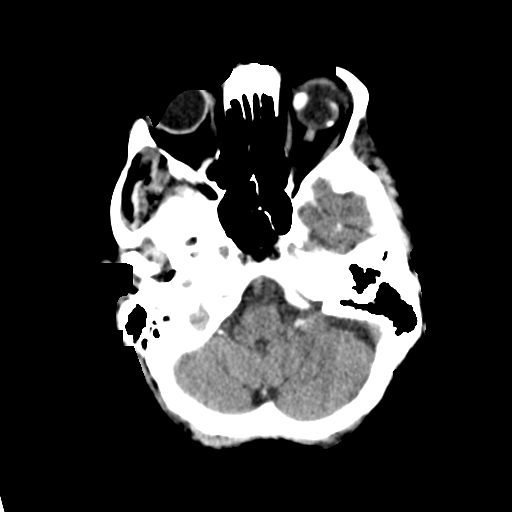
[im 9/33  brain]
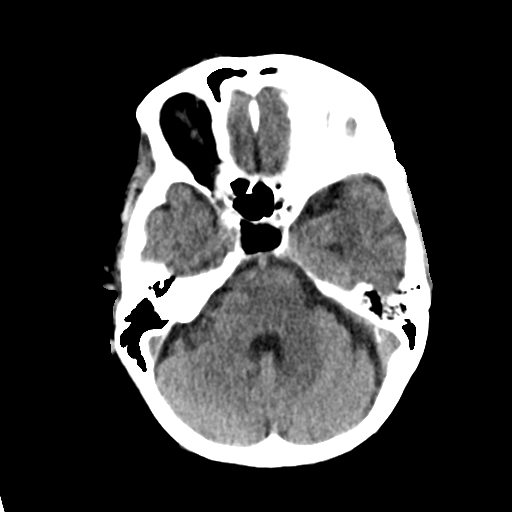
[im 12/33  brain]
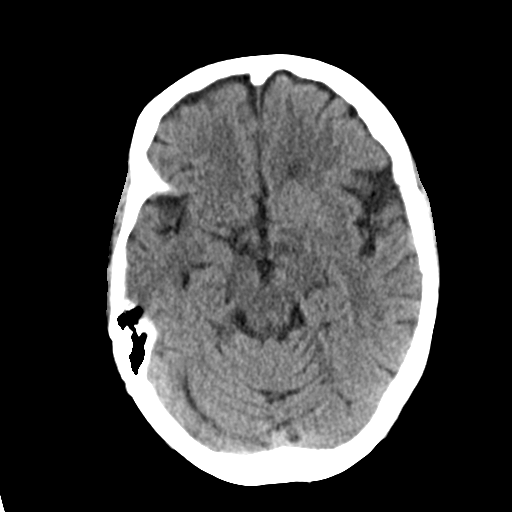
[im 15/33  brain]
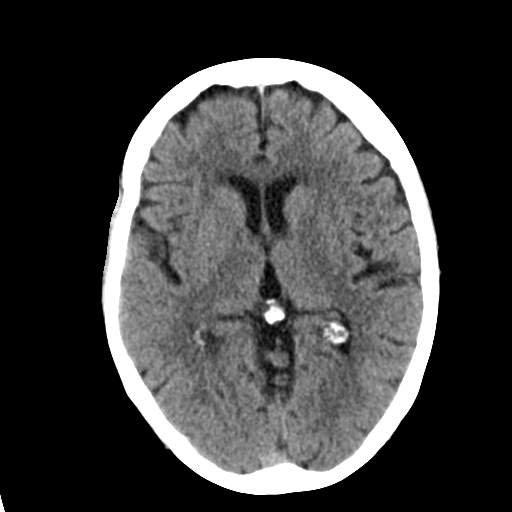
[im 15/33  bone]
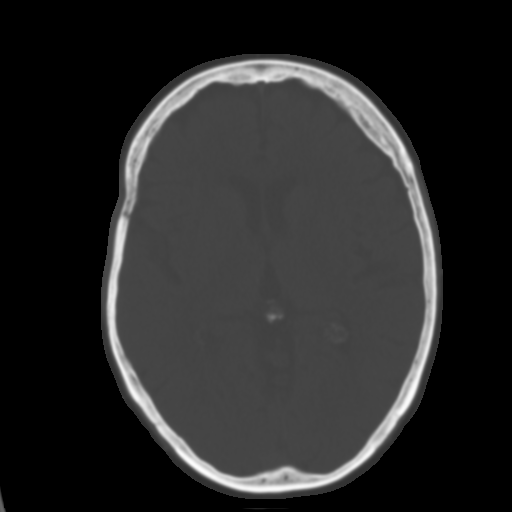
[im 18/33  brain]
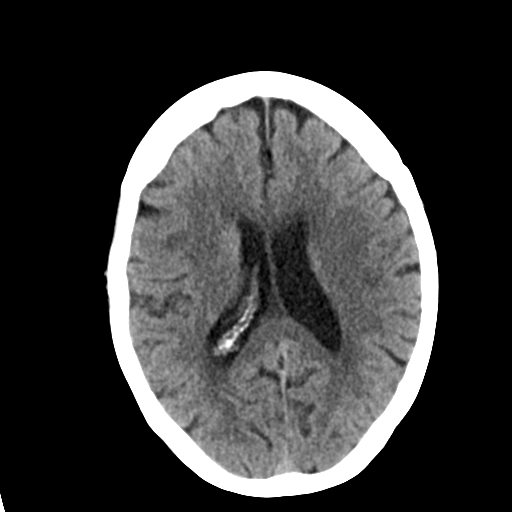
[im 21/33  brain]
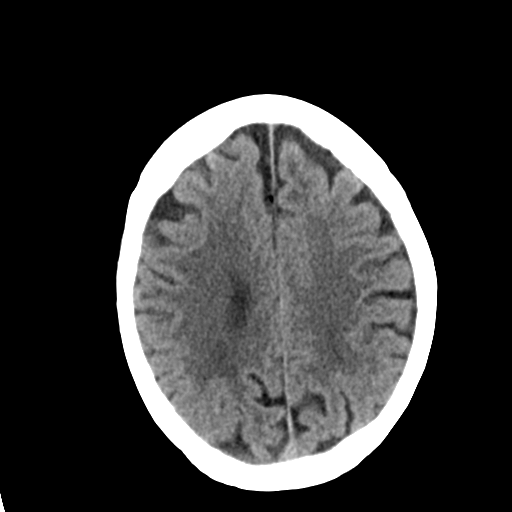
[im 25/33  brain]
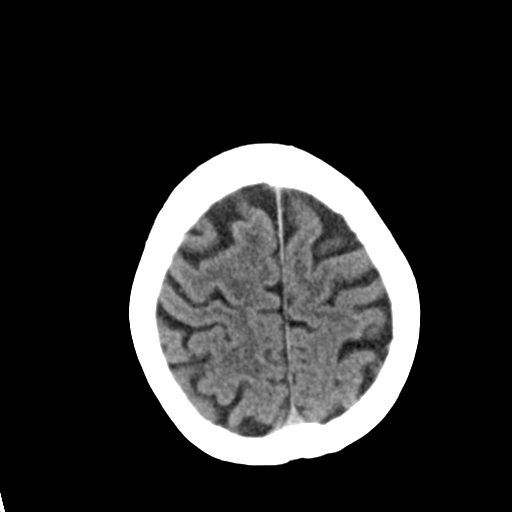
[im 27/33  brain]
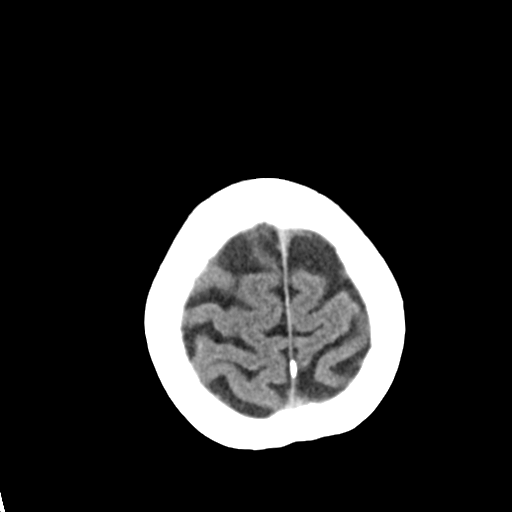
[im 27/33  bone]
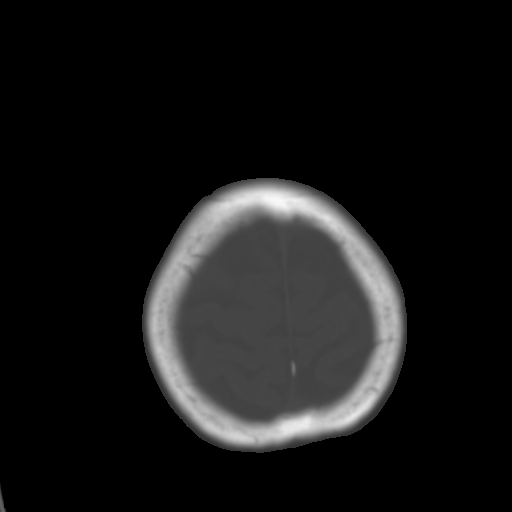
[im 30/33  brain]
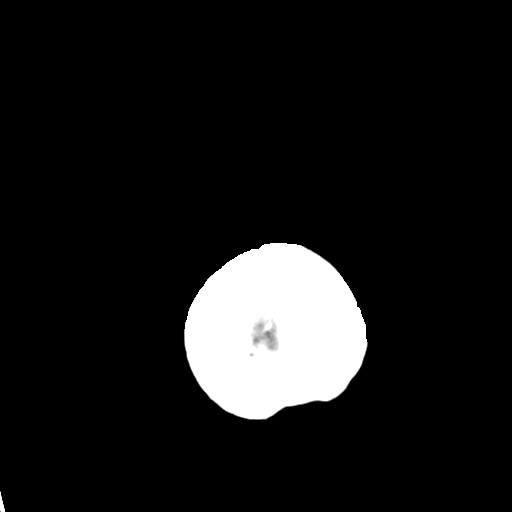

[Series 4: coronal soft tissue · coronal · 0.34mm/px · 3 of 67 slices shown]
[im 23/67  brain]
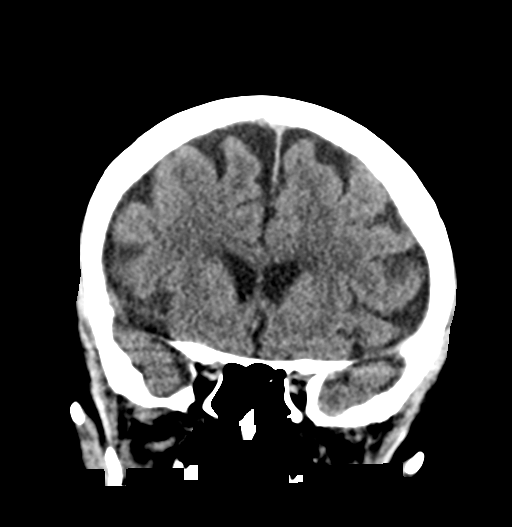
[im 30/67  brain]
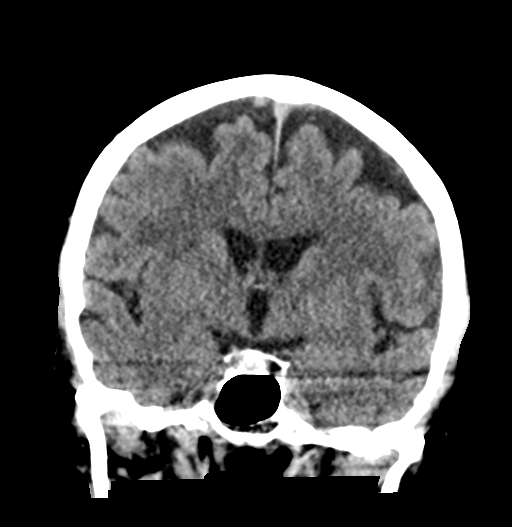
[im 37/67  brain]
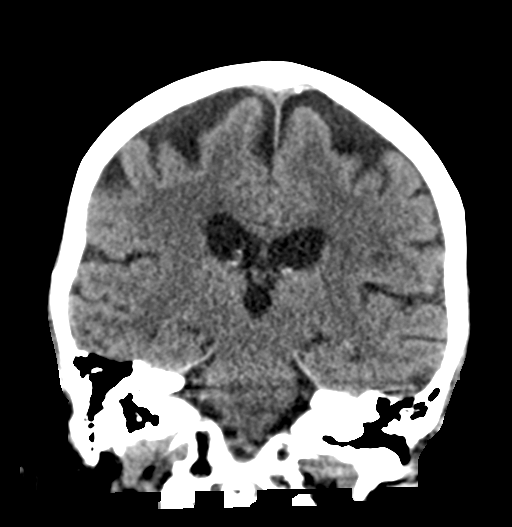

[Series 5: sagittal soft tissue · sagittal · 0.36mm/px · 3 of 57 slices shown]
[im 19/57  brain]
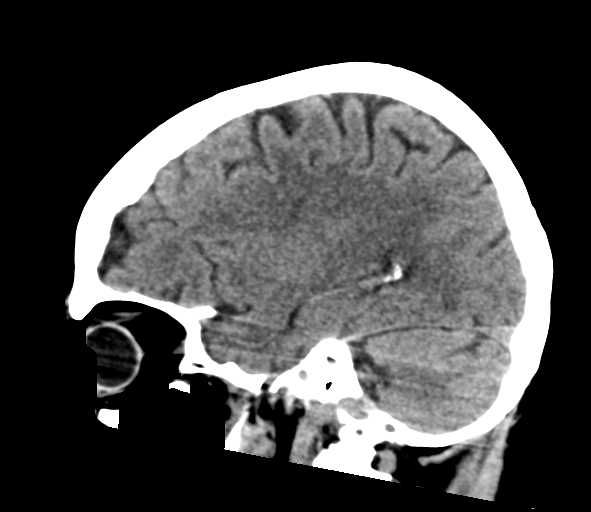
[im 29/57  brain]
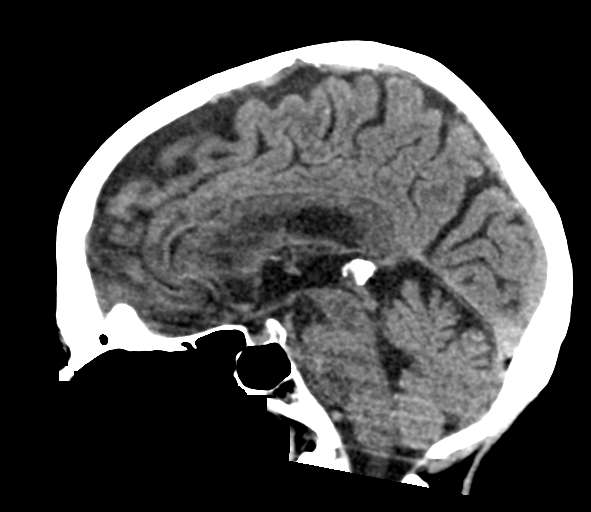
[im 38/57  brain]
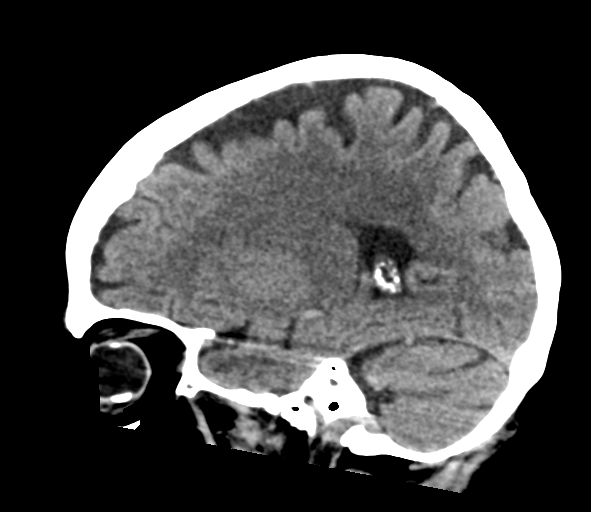

[16 of 47 positions shown; findings below may reference images not displayed]

FINDINGS: Brain: There is age related volume loss. There is no intracranial
mass, hemorrhage, extra-axial fluid collection, or midline shift.
There is patchy small vessel disease in the centra semiovale
bilaterally, stable. There is no demonstrable acute infarct.

Vascular: No hyperdense vessel. There is calcification in each
carotid siphon region. There is also mild calcification in the
distal vertebral arteries.

Skull: Bony calvarium appears intact.

Sinuses/Orbits: There is slight mucosal thickening in several
ethmoid air cells. Other visualized paranasal sinuses are clear.
Patient has undergone previous scleral banding on the left. Orbits
otherwise appear symmetric.

Other: Mastoid air cells are clear.
IMPRESSION: Age related volume loss with stable patchy periventricular small
vessel disease. No acute infarct evident. No mass or hemorrhage.

Foci of arterial vascular calcification noted.

Mucosal thickening in several ethmoid air cells. Status post scleral
banding left globe.

## 2018-12-28 ENCOUNTER — Other Ambulatory Visit: Payer: Self-pay | Admitting: Cardiovascular Disease

## 2018-12-28 NOTE — Telephone Encounter (Signed)
Rx request sent to pharmacy.  

## 2019-01-05 DIAGNOSIS — H1851 Endothelial corneal dystrophy: Secondary | ICD-10-CM | POA: Diagnosis not present

## 2019-01-05 DIAGNOSIS — H1820 Unspecified corneal edema: Secondary | ICD-10-CM | POA: Diagnosis not present

## 2019-01-05 DIAGNOSIS — H35363 Drusen (degenerative) of macula, bilateral: Secondary | ICD-10-CM | POA: Diagnosis not present

## 2019-01-05 DIAGNOSIS — H401131 Primary open-angle glaucoma, bilateral, mild stage: Secondary | ICD-10-CM | POA: Diagnosis not present

## 2019-01-06 ENCOUNTER — Ambulatory Visit (INDEPENDENT_AMBULATORY_CARE_PROVIDER_SITE_OTHER): Payer: Medicare Other | Admitting: Cardiovascular Disease

## 2019-01-06 ENCOUNTER — Encounter: Payer: Self-pay | Admitting: Cardiovascular Disease

## 2019-01-06 VITALS — BP 118/80 | HR 73 | Ht 66.0 in | Wt 169.6 lb

## 2019-01-06 DIAGNOSIS — E785 Hyperlipidemia, unspecified: Secondary | ICD-10-CM

## 2019-01-06 DIAGNOSIS — I2510 Atherosclerotic heart disease of native coronary artery without angina pectoris: Secondary | ICD-10-CM

## 2019-01-06 DIAGNOSIS — I519 Heart disease, unspecified: Secondary | ICD-10-CM | POA: Diagnosis not present

## 2019-01-06 DIAGNOSIS — Z79899 Other long term (current) drug therapy: Secondary | ICD-10-CM | POA: Diagnosis not present

## 2019-01-06 DIAGNOSIS — I10 Essential (primary) hypertension: Secondary | ICD-10-CM

## 2019-01-06 MED ORDER — EZETIMIBE 10 MG PO TABS: 10.0000 mg | ORAL_TABLET | Freq: Every day | ORAL | 3 refills | Status: DC

## 2019-01-06 NOTE — Progress Notes (Signed)
Cardiology Office Note    Date:  01/06/2019   ID:  Stela, Iwasaki 1932/12/24, MRN [id number]  PCP:  Celene Squibb, MD  Referring M.D.: Dr. Velvet Bathe Cardiologist:  Shelva Majestic, MD    History of Present Illness:  ALEXEI EY is a 85 y.o. female who presents for a 6 month follow-up cardiology evaluation   Ms. Kitchen is a mother of my patient, who had suffered an out of hospital cardiac arrest years ago.  Ms. Mcmasters has a history of prior cardiac catheterization and from records [name] had undergone cardiac catheterization in 1998.  The patient states her coronaries were clean.  I do not have specifics of her catheterization findings.  Over the past 6 months, she has developed progressive decline in ability to be active and has noticed significant increasing exertionally precipitated shortness of breath.  She also has had some episodes of associated chest pressure with left arm radiation.  She had undergone an echo Doppler study on 10/24/2016 which showed an EF of 50% and suggested diffuse hypokinesis.  There was grade 1 diastolic dysfunction.  There was mild MR and mild LA dilatation.  She also had undergone pulmonary function testing was not significantly abnormal.  Her past history is notable for left breast CA and she is status post prior lumpectomy and radiation treatment.  She was cared for by Dr. Excell Seltzer.  She also is status post hysterectomy.  Remote history of cholecystectomy.  She has a history of retinal detachment.  When I saw her, due to progressive symptomatology with decline in exercise tolerance and increasing exertional dyspnea with a very strong family history of CAD.  Definitive cardiac catheterization was recommended.  This was performed by me on 11/04/2016 and revealed low normal global LV function with an EF of 50-55% with mild mid inferior focal hypocontractility.  There was multivessel CAD with multiple stenoses of 40-50% mid to distal LAD.  She had a normal  ramus intermediate vessel.  There was 80% focal stenosis in the left circumflex and 95% eccentric mid RCA stenosis followed by 50% narrowing.  She underwent initial PCI to the mid RCA with insertion of a 2.2512 mm Resolute Onyx stent postdilated to 2.5 mm with the 95% stenoses being reduced to 0%.  The following day, on 11/05/2016 she underwent staged intervention to the left circumflex flex artery with the 50 and 85% stenoses being reduced to 0% with insertion of a 2.7523 mm  Xience Alpine DES stent postdilated to 3.0.  Since her intervention, she has felt significantly improved.  She had mild dyspnea more attributed to air hunger and this typically is relieved when she takes a deep sigh.   She was started on Lipitor but did not tolerate this.  Subsequently, I recommended initiation of Crestor at 5 mg 2 times a week.  She took 2 doses and then felt she had similar symptoms and stopped taking this.  She denies any exertional chest tightness.  She was diagnosed with a left knee Baker's cyst.  She denies palpitations.    She underwent a one-year follow-up nuclear stress test on 10/09/2017 which remained low risk and showed an EF of 51% without scar or ischemia.    She was  evaluated at [facility] emergency room when she was brought there by family members with complaints of increasing shortness of breath andvague nonexertional chest tightness.  There was concern of whether or not Brilinta could responsible.  Laboratory was negative.  She was  not found to have evidence for pneumonia or CHF and had a negative d-dimer.  She denies any episodes of chest pain since her evaluation.  She continues to experience exertional dyspnea.  He has been on aspirin and Brilinta for antiplatelet therapy, Zetia 10 mg for hyperlipidemia although when previously I suggested a trial of Livalo which she never pursued.    She underwent an echo Doppler study on Apr 03, 2018 which showed an EF of 50% with grade 1 diastolic dysfunction.   There was aortic sclerosis without stenosis, mild MR, and probable lipomatous hypertrophy of her atrial septum.  Although I again at that time had recommended institution of lipid-lowering therapy added to her Zetia with Crestor 5 mg weekly she never pursued this.  Her LV diastolic dysfunction and some shortness of breath I added spironolactone 12.5 mg.  The past several months she has felt improved.  She denies chest pain PND orthopnea.  She never started the rosuvastatin but was uncertain why. She denies bleeding.   I last saw her in August 2019, she has done well and has been without recurrent chest pain she denies palpitations.  Apparently for some reason she had discontinued Zetia but had tolerated this well.  She has not been able to tolerate any statins.  We had even tried to initiate Livalo but she never started this.  She believes she is sleeping well but she wakes up at least 3 times per night to night.  She is unaware of snoring or gasping for breath.  She presents for reevaluation  Past Medical History:  Diagnosis Date  . Anxiety   . Arthritis   . Blind left eye   . Breast cancer ([place])    left   . Colitis   . Coronary artery disease    a. 10/2016 Staged PCI: RCA 50p, 53m(2.25x12 Resolute Onyx DES), LCX 50p, 824m2.75x23 Xience Alpine DES). Residual LAD 50p/m, 4563m  . CMarland Kitchenstocele 10/11/2013  . Depression   . Diastolic dysfunction    a. 09/2016 Echo: EF 50%, diffuse hypokinesis, mild LVH, grade 1 diastolic dysfunction, mild mitral regurgitation, mildly dilated left atrium.  . GMarland KitchenRD (gastroesophageal reflux disease)    occasional Tums only  . HOH (hard of hearing)   . Pelvic relaxation 10/11/2013  . Proctitis    colonsocopy 2007, Canasa suppositories  . S/P endoscopy March 2009   mild erosive reflux esophagitis, Schatzki's ring, s/p dilation  . Schatzki's ring   . Shortness of breath    occ if anxiety  . Wears dentures    upper  . Wears glasses     Past Surgical History:    Procedure Laterality Date  . ANTERIOR AND POSTERIOR REPAIR N/A 05/17/2014   Procedure: ANTERIOR (CYSTOCELE) AND POSTERIOR REPAIR (RECTOCELE);  Surgeon: ScoReece PackerD;  [facility];  Service: Urology;  Laterality: N/A;  . BREAST LUMPECTOMY WITH NEEDLE LOCALIZATION AND AXILLARY SENTINEL LYMPH NODE BX Left 05/03/2013   Procedure: BREAST LUMPECTOMY WITH NEEDLE LOCALIZATION AND AXILLARY SENTINEL LYMPH NODE BX;  Surgeon: BenEdward JollyD;  [facility];  Laterality: Left;  . BREAST SURGERY Left 05/19/13  . CARDIAC CATHETERIZATION  1998  . CARDIAC CATHETERIZATION N/A 11/04/2016   Procedure: Left Heart Cath and Coronary Angiography;  Surgeon: ThoTroy SineD;  Location: [facility];  Service: Cardiovascular;  Laterality: N/A;  . CARDIAC CATHETERIZATION N/A 11/05/2016   Procedure: Coronary Stent Intervention;  Surgeon: ThoTroy Sine  MD;  Location: [place] [facility];  Service: Cardiovascular;  Laterality: N/A;  . COLONOSCOPY  05/06/2006   Diffuse inflammatory changes of the rectal mucosa, consistent  with proctitis.  Otherwise, normal colon to terminal ileum  . CORONARY ANGIOPLASTY  11/04/2016  . CORONARY STENT PLACEMENT     Drug-eluting coronary artery stent, non-bioabsorbable-polymer-coated  . CYSTOSCOPY N/A 05/17/2014   Procedure: CYSTOSCOPY;  Surgeon: Reece Packer, MD;  Location: [facility];  Service: Urology;  Laterality: N/A;  . ESOPHAGOGASTRODUODENOSCOPY  02/09/2008   Schatzki ring status post dilation/Distal esophageal erosion consistent with mild erosive reflux  esophagitis, otherwise unremarkable esophagus, normal stomach, D1, D2.  . EYE SURGERY     lt cataract-implant  . EYE SURGERY     lt-lens repaced,l  . Altha SURGERY  1993  . RE-EXCISION OF BREAST LUMPECTOMY Left 05/19/2013   Procedure: RE-EXCISION OF BREAST LUMPECTOMY;  Surgeon: Edward Jolly, MD;  Location: [place];  Service:  General;  Laterality: Left;  . RETINAL DETACHMENT SURGERY  1978   Left  . VAGINAL HYSTERECTOMY Bilateral 05/17/2014   Procedure: HYSTERECTOMY VAGINAL with Bilateral Salpingo-Oophorectomy; Bladder cystotomy repair;  Surgeon: Marvene Staff, MD;  Location: [facility];  Service: Gynecology;  Laterality: Bilateral;  . VAGINAL PROLAPSE REPAIR N/A 05/17/2014   Procedure: VAGINAL VAULT PROLAPSE AND GRAFT;  Surgeon: Reece Packer, MD;  Location: [facility] [facility];  Service: Urology;  Laterality: N/A;    Current Medications: Outpatient Medications Prior to Visit  Medication Sig Dispense Refill  . acetaminophen (TYLENOL) 500 MG tablet Take 500-1,000 mg by mouth every 6 (six) hours as needed for mild pain.    Marland Kitchen aspirin EC 81 MG tablet Take 1 tablet (81 mg total) by mouth daily. 90 tablet 3  . carboxymethylcellulose (REFRESH PLUS) 0.5 % SOLN Place 3-4 drops into both eyes 3 (three) times daily as needed (dry eyes). Preservative free    . clopidogrel (PLAVIX) 75 MG tablet     . metoprolol tartrate (LOPRESSOR) 25 MG tablet TAKE 1/2 TABLET BY MOUTH TWICE DAILY. 90 tablet 0  . Travoprost, BAK Free, (TRAVATAN) 0.004 % SOLN ophthalmic solution Place 1 drop into the right eye at bedtime.     . nitroGLYCERIN (NITROSTAT) 0.4 MG SL tablet Place 1 tablet (0.4 mg total) under the tongue every 5 (five) minutes as needed. (Patient not taking: Reported on 01/06/2019) 25 tablet 3  . clorazepate (TRANXENE) 15 MG tablet Take 7.5 mg by mouth 2 (two) times daily as needed for anxiety.    Marland Kitchen ezetimibe (ZETIA) 10 MG tablet TAKE ONE TABLET BY MOUTH ONCE DAILY. 60 tablet 0  . rosuvastatin (CRESTOR) 5 MG tablet Take 1 tablet (5 mg total) by mouth once a week. 20 tablet 3  . spironolactone (ALDACTONE) 25 MG tablet Take 12.5 mg by mouth daily.     No facility-administered medications prior to visit.      Allergies:   Contrast media [iodinated diagnostic agents]; Lipitor [atorvastatin]; Sulfamethoxazole; and Sulfonamide derivatives    Social History   Socioeconomic History  . Marital status: Widowed    Spouse name: Not on file  . Number of children: Not on file  . Years of education: Not on file  . Highest education level: Not on file  Occupational History  . Occupation: Retired  Scientific laboratory technician  . Financial resource strain: Not on file  . Food insecurity:    Worry: Not on file    Inability: Not on file  . Transportation needs:  Medical: Not on file    Non-medical: Not on file  Tobacco Use  . Smoking status: Former Smoker    Packs/day: 1.00    Types: Cigarettes    Last attempt to quit: 04/28/1990    Years since quitting: 28.7  . Smokeless tobacco: Never Used  . Tobacco comment: quit 19 yrs ago  Substance and Sexual Activity  . Alcohol use: Yes    Comment: occ  . Drug use: No  . Sexual activity: Not Currently    Birth control/protection: Post-menopausal  Lifestyle  . Physical activity:    Days per week: Not on file    Minutes per session: Not on file  . Stress: Not on file  Relationships  . Social connections:    Talks on phone: Not on file    Gets together: Not on file    Attends religious service: Not on file    Active member of club or organization: Not on file    Attends meetings of clubs or organizations: Not on file    Relationship status: Not on file  Other Topics Concern  . Not on file  Social History Narrative  . Not on file     Family History:  The patient's family history includes Cancer in her brother; Multiple myeloma in her daughter.  I mother died and had a myocardial infarction.  Her father also had suffered a myocardial infarction.  She had 3 sisters who also had myocardial infarction and also one of her brothers also had suffered a myocardial infarction.  ROS General: Negative; No fevers, chills, or night sweats;  HEENT: History of retinal detachment, sinus congestion, difficulty swallowing Pulmonary: Negative; No cough, wheezing, shortness of breath,  hemoptysis Cardiovascular: see HPI GI: Positive for occasional loose stools GU: Negative; No dysuria, hematuria, or difficulty voiding Musculoskeletal: Positive for left Baker's cyst Hematologic/Oncology: Negative; no easy bruising, bleeding Endocrine: Negative; no heat/cold intolerance; no diabetes Neuro: Negative; no changes in balance, headaches Skin: Negative; No rashes or skin lesions Psychiatric: Negative; No behavioral problems, depression Sleep: Negative; No snoring, daytime sleepiness, hypersomnolence, bruxism, restless legs, hypnogognic hallucinations, no cataplexy Other comprehensive 14 point system review is negative.   PHYSICAL EXAM:   VS:  BP 118/80   Pulse 73   Ht 5' 6"  (1.676 m)   Wt 169 lb 9.6 oz (76.9 kg)   SpO2 96%   BMI 27.37 kg/m     Blood pressure by me was 120/76  Wt Readings from Last 3 Encounters:  01/06/19 169 lb 9.6 oz (76.9 kg)  07/22/18 166 lb 6.4 oz (75.5 kg)  03/30/18 167 lb 3.2 oz (75.8 kg)    General: Alert, oriented, no distress.  Skin: normal turgor, no rashes, warm and dry HEENT: Normocephalic, atraumatic. Pupils equal round and reactive to light; sclera anicteric; extraocular muscles intact;  Nose without nasal septal hypertrophy Mouth/Parynx benign; Mallinpatti scale 3 Neck: No JVD, no carotid bruits; normal carotid upstroke Lungs: clear to ausculatation and percussion; no wheezing or rales Chest wall: without tenderness to palpitation Heart: PMI not displaced, RRR, s1 s2 normal, 1/6 systolic murmur, no diastolic murmur, no rubs, gallops, thrills, or heaves Abdomen: soft, nontender; no hepatosplenomehaly, BS+; abdominal aorta nontender and not dilated by palpation. Back: no CVA tenderness Pulses 2+ Musculoskeletal: full range of motion, normal strength, no joint deformities Extremities: no clubbing cyanosis or edema, Homan's sign negative  Neurologic: grossly nonfocal; Cranial nerves grossly wnl Psychologic: Normal mood and  affect    Studies/Labs Reviewed:  ECG (independently read by me): Minus rhythm at 73 bpm with sinus arrhythmia.  QTc interval 458 ms.  PR interval 152 ms.  No significant ST-T change.  August 2019 ECG (independently read by me): Sinus rhythm at 73 with sinus arrhythmia.  Normal intervals.  May 2019 ECG (independently read by me): Normal sinus rhythm at 66 bpm.  No ectopy.  No ST segment changes.  December 2018 ECG (independently read by me): Normal sinus rhythm at 77 bpm.  Nonspecific ST changes.  June 2018 ECG (independently read by me): Sinus bradycardia 56 bpm.  Normal intervals.  No significant ST-T changes.  March 2018 ECG (independently read by me): Sinus bradycardia 54 bpm.  Normal intervals.  No ST segment changes.  December 2017 ECG (independently read by me): Normal sinus rhythm at 74 bpm.  No ectopy.  QTc interval 455 ms.  Recent Labs: BMP Latest Ref Rng & Units 03/03/2018 06/24/2017 11/19/2016  Glucose 65 - 99 mg/dL 137(H) 96 95  BUN 6 - 20 mg/dL 8 12 12   Creatinine 0.44 - 1.00 mg/dL 0.79 0.77 0.85  Sodium 135 - 145 mmol/L 138 140 139  Potassium 3.5 - 5.1 mmol/L 3.7 4.8 4.6  Chloride 101 - 111 mmol/L 105 106 105  CO2 22 - 32 mmol/L 24 28 25   Calcium 8.9 - 10.3 mg/dL 9.2 8.9 9.1     Hepatic Function Latest Ref Rng & Units 03/03/2018 06/24/2017 11/19/2016  Total Protein 6.5 - 8.1 g/dL 7.3 7.2 6.8  Albumin 3.5 - 5.0 g/dL 3.7 3.7 3.7  AST 15 - 41 U/L 18 24 19   ALT 14 - 54 U/L 11(L) 15 14  Alk Phosphatase 38 - 126 U/L 61 58 57  Total Bilirubin 0.3 - 1.2 mg/dL 0.6 0.7 0.6  Bilirubin, Direct 0.1 - 0.5 mg/dL <0.1(L) - 0.1    CBC Latest Ref Rng & Units 03/03/2018 06/24/2017 11/19/2016  WBC 4.0 - 10.5 K/uL 9.9 8.0 9.1  Hemoglobin 12.0 - 15.0 g/dL 12.9 12.4 12.7  Hematocrit 36.0 - 46.0 % 39.6 37.6 39.0  Platelets 150 - 400 K/uL 255 269 236   Lab Results  Component Value Date   MCV 93.0 03/03/2018   MCV 93.3 06/24/2017   MCV 93.8 11/19/2016   No results found for:  TSH No results found for: HGBA1C   BNP    Component Value Date/Time   BNP 51.0 09/01/2016 1012    ProBNP No results found for: PROBNP   Lipid Panel     Component Value Date/Time   CHOL 227 (H) 10/31/2016 1245   TRIG 164 (H) 10/31/2016 1245   HDL 47 (L) 10/31/2016 1245   CHOLHDL 4.8 10/31/2016 1245   VLDL 33 (H) 10/31/2016 1245   LDLCALC 147 (H) 10/31/2016 1245     RADIOLOGY: Ct Head Wo Contrast  Result Date: 12/11/2018 CLINICAL DATA:  Lower extremity weakness. Recent episode of dysarthria EXAM: CT HEAD WITHOUT CONTRAST TECHNIQUE: Contiguous axial images were obtained from the base of the skull through the vertex without intravenous contrast. COMPARISON:  November 29, 2018 FINDINGS: Brain: There is age related volume loss. There is no intracranial mass, hemorrhage, extra-axial fluid collection, or midline shift. There is patchy small vessel disease in the centra semiovale bilaterally, stable. There is no demonstrable acute infarct. Vascular: No hyperdense vessel. There is calcification in each carotid siphon region. There is also mild calcification in the distal vertebral arteries. Skull: Bony calvarium appears intact. Sinuses/Orbits: There is slight mucosal thickening in several ethmoid air cells.  Other visualized paranasal sinuses are clear. Patient has undergone previous scleral banding on the left. Orbits otherwise appear symmetric. Other: Mastoid air cells are clear. IMPRESSION: Age related volume loss with stable patchy periventricular small vessel disease. No acute infarct evident. No mass or hemorrhage. Foci of arterial vascular calcification noted. Mucosal thickening in several ethmoid air cells. Status post scleral banding left globe. Electronically Signed   By: Lowella Grip III M.D.   On: 12/11/2018 07:57     Additional studies/ records that were reviewed today include:   At her last office visit I reviewed the office records of [name].  I reviewed her echo Doppler  study and pulmonary function tests. I reviewed a  recent head CT  ASSESSMENT:    1. Hyperlipidemia with target LDL less than 70   2. Coronary artery disease involving native coronary artery of native heart without angina pectoris: s/p DES stent to RCA and left circumflex vessel December 2017   3. Medication management   4. Essential hypertension   5. Left ventricular diastolic dysfunction     PLAN:  Ms. Avalin Briley is an 85 year old Caucasian female who had developed progressive decline in exercise tolerance with significant increasing shortness of breath with minimal activity associated with chest pressure and left arm radiation.  In 1998, reportedly a cardiac catheterization had shown normal coronary arteries.  An echo Doppler study from November 2017 showed an EF of 50% with diffuse hypokinesis.  There was mild MR and mild dilatation of her left atrium. Her pulmonary function studies did not identified significant pulmonary disability in the etiology of her dyspnea.  With her very strong family history of myocardial infarctions in multiple family members, as well as remote tobacco history and progressive changes in symptomatology she underwent repeat cardiac catheterization which was done on November 04, 2016.  This revealed severe multivessel CAD as noted above and she underwent successful stenting of her RCA as well as her left circumflex vessels in a staged procedure.  When I saw her, she had noticed some subsequent shortness of breath which improved with initiation of Spironolactone.  She currently is no longer taking this apparently is just on metoprolol 12.5 mg.  Her Brilinta was substituted with Plavix and P2Y12 testing indicated Plavix responsiveness.  Currently she is no longer taking Zetia for some reason and in the past did not tolerate statins.  Of note, her LDL cholesterol was elevated at 111 when last checked in in August 2019 by Dr. Wende Neighbors.  I have recommended reinstitution of  Zetia 10 mg.  In 1 month I am recommending a complete set of repeat fasting laboratory.  Target LDL is less than 70.  He denies any shortness of breath.  She is tolerating Plavix.  Just wants to her medical regimen will be made if necessary once laboratory is complete.  We will see her in 6 months for cardiology reevaluation.  Medication Adjustments/Labs and Tests Ordered: Current medicines are reviewed at length with the patient today.  Concerns regarding medicines are outlined above.  Medication changes, Labs and Tests ordered today are listed in the Patient Instructions below.  Patient Instructions  Medication Instructions:  Restart Zetia 10 mg daily.  If you need a refill on your cardiac medications before your next appointment, please call your pharmacy.   Lab work: After 1 month, fasting lab work If you have labs (blood work) drawn today and your tests are completely normal, you will receive your results only by: Marland Kitchen MyChart  Message (if you have MyChart) OR . A paper copy in the mail If you have any lab test that is abnormal or we need to change your treatment, we will call you to review the results.   Follow-Up: At [facility], you and your health needs are our priority.  As part of our continuing mission to provide you with exceptional heart care, we have created designated Provider Care Teams.  These Care Teams include your primary Cardiologist (physician) and Advanced Practice Providers (APPs -  Physician Assistants and Nurse Practitioners) who all work together to provide you with the care you need, when you need it. You will need a follow up appointment in 6 months.  Please call our office 2 months in advance to schedule this appointment.  You may see Dr.Estelene Carmack or one of the following Advanced Practice Providers on your designated Care Team: Almyra Deforest, [place] . Fabian Sharp, PA-C       Signed, Shelva Majestic, MD, [facility] 01/06/2019 [place] [address], [place], [place], [place]  [address] Phone: [phone]

## 2019-01-06 NOTE — Patient Instructions (Signed)
Medication Instructions:  Restart Zetia 10 mg daily.  If you need a refill on your cardiac medications before your next appointment, please call your pharmacy.   Lab work: After 1 month, fasting lab work If you have labs (blood work) drawn today and your tests are completely normal, you will receive your results only by: Marland Kitchen MyChart Message (if you have MyChart) OR . A paper copy in the mail If you have any lab test that is abnormal or we need to change your treatment, we will call you to review the results.   Follow-Up: At [facility], you and your health needs are our priority.  As part of our continuing mission to provide you with exceptional heart care, we have created designated Provider Care Teams.  These Care Teams include your primary Cardiologist (physician) and Advanced Practice Providers (APPs -  Physician Assistants and Nurse Practitioners) who all work together to provide you with the care you need, when you need it. You will need a follow up appointment in 6 months.  Please call our office 2 months in advance to schedule this appointment.  You may see Dr.Kelly or one of the following Advanced Practice Providers on your designated Care Team: Almyra Deforest, [place] . Fabian Sharp, PA-C

## 2019-01-20 DIAGNOSIS — I10 Essential (primary) hypertension: Secondary | ICD-10-CM | POA: Diagnosis not present

## 2019-01-20 DIAGNOSIS — E782 Mixed hyperlipidemia: Secondary | ICD-10-CM | POA: Diagnosis not present

## 2019-01-25 DIAGNOSIS — I2510 Atherosclerotic heart disease of native coronary artery without angina pectoris: Secondary | ICD-10-CM | POA: Diagnosis not present

## 2019-01-25 DIAGNOSIS — F411 Generalized anxiety disorder: Secondary | ICD-10-CM | POA: Diagnosis not present

## 2019-01-25 DIAGNOSIS — I10 Essential (primary) hypertension: Secondary | ICD-10-CM | POA: Diagnosis not present

## 2019-01-25 DIAGNOSIS — L259 Unspecified contact dermatitis, unspecified cause: Secondary | ICD-10-CM | POA: Diagnosis not present

## 2019-01-25 DIAGNOSIS — E782 Mixed hyperlipidemia: Secondary | ICD-10-CM | POA: Diagnosis not present

## 2019-03-30 ENCOUNTER — Other Ambulatory Visit: Payer: Self-pay | Admitting: Cardiovascular Disease

## 2019-03-30 NOTE — Telephone Encounter (Signed)
Lopressor 25 mg refilled 

## 2019-04-14 DIAGNOSIS — Z0000 Encounter for general adult medical examination without abnormal findings: Secondary | ICD-10-CM | POA: Diagnosis not present

## 2019-05-24 ENCOUNTER — Telehealth: Payer: Self-pay | Admitting: Cardiovascular Disease

## 2019-05-24 NOTE — Telephone Encounter (Signed)
New Message   Pt c/o of Chest Pain: STAT if CP now or developed within 24 hours  1. Are you having CP right now? No   2. Are you experiencing any other symptoms (ex. SOB, nausea, vomiting, sweating)? Sometimes she experiences sob when moving and bending   3. How long have you been experiencing CP? On and off for a while   4. Is your CP continuous or coming and going? Coming and going   5. Have you taken Nitroglycerin? No  ?

## 2019-05-24 NOTE — Telephone Encounter (Signed)
Returned call to pt she states that she "always has chest pain and SOB, this is nothing new for her, and she knows when she needs to take nitro" she just wanted to make sue that she had a scheduled appointment. Informed pt that she has appt scheduled 7-16 with Isaac Laud, and that Dr [name] is in the office that day. She states that she will continue to take medication as directed and make sure to come to appt.

## 2019-05-26 DIAGNOSIS — E782 Mixed hyperlipidemia: Secondary | ICD-10-CM | POA: Diagnosis not present

## 2019-05-26 DIAGNOSIS — I10 Essential (primary) hypertension: Secondary | ICD-10-CM | POA: Diagnosis not present

## 2019-05-31 DIAGNOSIS — I2510 Atherosclerotic heart disease of native coronary artery without angina pectoris: Secondary | ICD-10-CM | POA: Diagnosis not present

## 2019-05-31 DIAGNOSIS — R05 Cough: Secondary | ICD-10-CM | POA: Diagnosis not present

## 2019-05-31 DIAGNOSIS — Z6827 Body mass index (BMI) 27.0-27.9, adult: Secondary | ICD-10-CM | POA: Diagnosis not present

## 2019-05-31 DIAGNOSIS — E782 Mixed hyperlipidemia: Secondary | ICD-10-CM | POA: Diagnosis not present

## 2019-05-31 DIAGNOSIS — E663 Overweight: Secondary | ICD-10-CM | POA: Diagnosis not present

## 2019-05-31 DIAGNOSIS — I10 Essential (primary) hypertension: Secondary | ICD-10-CM | POA: Diagnosis not present

## 2019-05-31 DIAGNOSIS — F411 Generalized anxiety disorder: Secondary | ICD-10-CM | POA: Diagnosis not present

## 2019-06-03 MED ORDER — EZETIMIBE 10 MG PO TABS: 10.0000 mg | ORAL_TABLET | Freq: Every day | ORAL | 3 refills | Status: DC

## 2019-06-10 ENCOUNTER — Ambulatory Visit: Payer: Medicare Other | Admitting: Physician Assistant

## 2019-06-14 ENCOUNTER — Other Ambulatory Visit: Payer: Self-pay

## 2019-06-14 ENCOUNTER — Ambulatory Visit (INDEPENDENT_AMBULATORY_CARE_PROVIDER_SITE_OTHER): Payer: Medicare Other | Admitting: Physician Assistant

## 2019-06-14 VITALS — BP 120/74 | HR 82 | Temp 97.9°F | Ht 66.5 in | Wt 171.0 lb

## 2019-06-14 DIAGNOSIS — I10 Essential (primary) hypertension: Secondary | ICD-10-CM | POA: Diagnosis not present

## 2019-06-14 DIAGNOSIS — I2510 Atherosclerotic heart disease of native coronary artery without angina pectoris: Secondary | ICD-10-CM

## 2019-06-14 DIAGNOSIS — E785 Hyperlipidemia, unspecified: Secondary | ICD-10-CM | POA: Diagnosis not present

## 2019-06-14 NOTE — Patient Instructions (Signed)
Medication Instructions:  Your physician recommends that you continue on your current medications as directed. Please refer to the Current Medication list given to you today.  If you need a refill on your cardiac medications before your next appointment, please call your pharmacy.   Lab work: You will need to return to the office in 3 months to have labs (blood work) drawn:  Fasting Lipid-Do not eat or drink past midnight  Liver Function   If you have labs (blood work) drawn today and your tests are completely normal, you will receive your results only by: Marland Kitchen MyChart Message (if you have MyChart) OR . A paper copy in the mail If you have any lab test that is abnormal or we need to change your treatment, we will call you to review the results.  Testing/Procedures: NONE ordered at this time of appointment   Follow-Up: At [facility], you and your health needs are our priority.  As part of our continuing mission to provide you with exceptional heart care, we have created designated Provider Care Teams.  These Care Teams include your primary Cardiologist (physician) and Advanced Practice Providers (APPs -  Physician Assistants and Nurse Practitioners) who all work together to provide you with the care you need, when you need it. You will need a follow up appointment in 6 months.  Please call our office 2 months in advance to schedule this appointment.  You may see Shelva Majestic, MD or one of the following Advanced Practice Providers on your designated Care Team: [place], [place] . Fabian Sharp, PA-C  Any Other Special Instructions Will Be Listed Below (If Applicable).

## 2019-06-14 NOTE — Progress Notes (Signed)
Cardiology Office Note    Date:  06/16/2019   ID:  Karlei, Waldo 02-Jan-1933, MRN [id number]  PCP:  Celene Squibb, MD  Cardiologist:  [name]   Chief Complaint  Patient presents with  . Follow-up    seen for [name]    History of Present Illness:  Kara Woods is a 85 y.o. female with PMH of CAD, GERD, HTN and HLD.  He first underwent cardiac catheterization in 1998.  Repeat cardiac catheterization in December 2017 showed EF 50 to 55%, multivessel CAD with 40 to 50% mid to distal LAD lesion, normal ramus, 80% disease in left circumflex and a 95% mid RCA lesion which was treated with DES.  She underwent staged PCI with DES to left circumflex artery.  She had a nuclear stress test in November 2018 which remain low risk with EF of 51%, no scar or ischemia.  Echocardiogram in May 2019 showed EF 50% with grade 1 DD, moderate MR.  She was last seen by [name] in February 2020, Zetia was restarted.  Patient presents today for cardiology office visit.  She says she just restarted on the Zetia a week ago.  I did recommend she repeat a fasting lipid panel and a liver function test in 3 months before return to see cardiology service for 64-monthvisit.  Otherwise she denies any significant chest discomfort, lower extremity edema, orthopnea or PND.  She does have some baseline dyspnea with exertion, however this has not changed recently.  On physical exam, she does not have any significant lower extremity edema.   Past Medical History:  Diagnosis Date  . Anxiety   . Arthritis   . Blind left eye   . Breast cancer [place]    [place]   . Colitis   . Coronary artery disease    a. 10/2016 Staged PCI: RCA 50p, 831m2.25x12 Resolute Onyx DES), LCX 50p, 8039m.75x23 Xience Alpine DES). Residual LAD 50p/m, 58m58m . CyMarland Kitchentocele 10/11/2013  . Depression   . Diastolic dysfunction    a. 09/2016 Echo: EF 50%, diffuse hypokinesis, mild LVH, grade 1 diastolic dysfunction, mild mitral regurgitation,  mildly dilated left atrium.  . GEMarland KitchenD (gastroesophageal reflux disease)    occasional Tums only  . HOH (hard of hearing)   . Pelvic relaxation 10/11/2013  . Proctitis    colonsocopy 2007, Canasa suppositories  . S/P endoscopy March 2009   mild erosive reflux esophagitis, Schatzki's ring, s/p dilation  . Schatzki's ring   . Shortness of breath    occ if anxiety  . Wears dentures    upper  . Wears glasses     Past Surgical History:  Procedure Laterality Date  . ANTERIOR AND POSTERIOR REPAIR N/A 05/17/2014   Procedure: ANTERIOR (CYSTOCELE) AND POSTERIOR REPAIR (RECTOCELE);  Surgeon: ScotReece Packer;  [facility];  Service: Urology;  Laterality: N/A;  . BREAST LUMPECTOMY WITH NEEDLE LOCALIZATION AND AXILLARY SENTINEL LYMPH NODE BX Left 05/03/2013   Procedure: BREAST LUMPECTOMY WITH NEEDLE LOCALIZATION AND AXILLARY SENTINEL LYMPH NODE BX;  Surgeon: BenjEdward Jolly;  [facility];  Laterality: Left;  . BREAST SURGERY Left 05/19/13  . CARDIAC CATHETERIZATION  1998  . CARDIAC CATHETERIZATION N/A 11/04/2016   Procedure: Left Heart Cath and Coronary Angiography;  Surgeon: ThomTroy Sine;  [facility];  Service: Cardiovascular;  Laterality: N/A;  . CARDIAC CATHETERIZATION N/A 11/05/2016   Procedure: Coronary  Stent Intervention;  Surgeon: Troy Sine, MD;  Location: [facility];  Service: Cardiovascular;  Laterality: N/A;  . COLONOSCOPY  05/06/2006   Diffuse inflammatory changes of the rectal mucosa, consistent  with proctitis.  Otherwise, normal colon to terminal ileum  . CORONARY ANGIOPLASTY  11/04/2016  . CORONARY STENT PLACEMENT     Drug-eluting coronary artery stent, non-bioabsorbable-polymer-coated  . CYSTOSCOPY N/A 05/17/2014   Procedure: CYSTOSCOPY;  Surgeon: Reece Packer, MD;  Location: [facility];  Service: Urology;  Laterality: N/A;  . ESOPHAGOGASTRODUODENOSCOPY  02/09/2008   Schatzki ring  status post dilation/Distal esophageal erosion consistent with mild erosive reflux  esophagitis, otherwise unremarkable esophagus, normal stomach, D1, D2.  . EYE SURGERY     lt cataract-implant  . EYE SURGERY     lt-lens repaced,l  . [place] SURGERY  1993  . RE-EXCISION OF BREAST LUMPECTOMY Left 05/19/2013   Procedure: RE-EXCISION OF BREAST LUMPECTOMY;  Surgeon: Edward Jolly, MD;  Location: [employer];  Service: General;  Laterality: Left;  . RETINAL DETACHMENT SURGERY  1978   Left  . VAGINAL HYSTERECTOMY Bilateral 05/17/2014   Procedure: HYSTERECTOMY VAGINAL with Bilateral Salpingo-Oophorectomy; Bladder cystotomy repair;  Surgeon: Marvene Staff, MD;  Location: [facility];  Service: Gynecology;  Laterality: Bilateral;  . VAGINAL PROLAPSE REPAIR N/A 05/17/2014   Procedure: VAGINAL VAULT PROLAPSE AND GRAFT;  Surgeon: Reece Packer, MD;  Location: [facility];  Service: Urology;  Laterality: N/A;    Current Medications: Outpatient Medications Prior to Visit  Medication Sig Dispense Refill  . acetaminophen (TYLENOL) 500 MG tablet Take 500-1,000 mg by mouth every 6 (six) hours as needed for mild pain.    Marland Kitchen aspirin EC 81 MG tablet Take 1 tablet (81 mg total) by mouth daily. 90 tablet 3  . carboxymethylcellulose (REFRESH PLUS) 0.5 % SOLN Place 3-4 drops into both eyes 3 (three) times daily as needed (dry eyes). Preservative free    . clopidogrel (PLAVIX) 75 MG tablet     . ezetimibe (ZETIA) 10 MG tablet Take 1 tablet (10 mg total) by mouth daily. 90 tablet 3  . metoprolol tartrate (LOPRESSOR) 25 MG tablet TAKE 1/2 TABLET BY MOUTH TWICE DAILY. 90 tablet 2  . nitroGLYCERIN (NITROSTAT) 0.4 MG SL tablet Place 1 tablet (0.4 mg total) under the tongue every 5 (five) minutes as needed. 25 tablet 3  . Travoprost, BAK Free, (TRAVATAN) 0.004 % SOLN ophthalmic solution Place 1 drop into the right eye at bedtime.      No facility-administered medications prior to visit.       Allergies:   Contrast media [iodinated diagnostic agents], Lipitor [atorvastatin], Sulfamethoxazole, and Sulfonamide derivatives   Social History   Socioeconomic History  . Marital status: Widowed    Spouse name: Not on file  . Number of children: Not on file  . Years of education: Not on file  . Highest education level: Not on file  Occupational History  . Occupation: Retired  Scientific laboratory technician  . Financial resource strain: Not on file  . Food insecurity    Worry: Not on file    Inability: Not on file  . Transportation needs    Medical: Not on file    Non-medical: Not on file  Tobacco Use  . Smoking status: Former Smoker    Packs/day: 1.00    Types: Cigarettes    Quit date: 04/28/1990    Years since quitting: 29.1  . Smokeless tobacco: Never Used  . Tobacco  comment: quit 19 yrs ago  Substance and Sexual Activity  . Alcohol use: Yes    Comment: occ  . Drug use: No  . Sexual activity: Not Currently    Birth control/protection: Post-menopausal  Lifestyle  . Physical activity    Days per week: Not on file    Minutes per session: Not on file  . Stress: Not on file  Relationships  . Social Herbalist on phone: Not on file    Gets together: Not on file    Attends religious service: Not on file    Active member of club or organization: Not on file    Attends meetings of clubs or organizations: Not on file    Relationship status: Not on file  Other Topics Concern  . Not on file  Social History Narrative  . Not on file     Family History:  The patient's family history includes Cancer in her brother; Multiple myeloma in her daughter.   ROS:   Please see the history of present illness.    ROS All other systems reviewed and are negative.   PHYSICAL EXAM:   VS:  BP 120/74   Pulse 82   Temp 97.9 F (36.6 C) (Temporal)   Ht 5' 6.5" (1.689 m)   Wt 171 lb (77.6 kg)   SpO2 94%   BMI 27.19 kg/m    GEN: Well nourished, well developed, in no acute distress   HEENT: normal  Neck: no JVD, carotid bruits, or masses Cardiac: RRR; no murmurs, rubs, or gallops,no edema  Respiratory:  clear to auscultation bilaterally, normal work of breathing GI: soft, nontender, nondistended, + BS MS: no deformity or atrophy  Skin: warm and dry, no rash Neuro:  Alert and Oriented x 3, Strength and sensation are intact Psych: euthymic mood, full affect  Wt Readings from Last 3 Encounters:  06/14/19 171 lb (77.6 kg)  01/06/19 169 lb 9.6 oz (76.9 kg)  07/22/18 166 lb 6.4 oz (75.5 kg)      Studies/Labs Reviewed:   EKG:  EKG is not ordered today.    Recent Labs: No results found for requested labs within last 8760 hours.   Lipid Panel    Component Value Date/Time   CHOL 227 (H) 10/31/2016 1245   TRIG 164 (H) 10/31/2016 1245   HDL 47 (L) 10/31/2016 1245   CHOLHDL 4.8 10/31/2016 1245   VLDL 33 (H) 10/31/2016 1245   LDLCALC 147 (H) 10/31/2016 1245    Additional studies/ records that were reviewed today include:   Echo 04/03/2018 LV EF: 50% -  ------------------------------------------------------------------- Indications:      Dyspnea 786.09.  ------------------------------------------------------------------- History:   PMH:  Acquired from the patient and from the patient&'s chart.  Coronary artery disease.  PMH:  Cancer of left breast  Risk factors:  Hypertension. Dyslipidemia.  ------------------------------------------------------------------- Study Conclusions  - Left ventricle: The cavity size was normal. Wall thickness was   normal. Systolic function was normal. The estimated ejection   fraction was = 50%. Wall motion was normal; there were no   regional wall motion abnormalities. Doppler parameters are   consistent with abnormal left ventricular relaxation (grade 1   diastolic dysfunction). - Aortic valve: Mildly calcified annulus. Trileaflet; normal   thickness leaflets. There was mild regurgitation. Mean gradient   (S): 2 mm  Hg. Valve area (VTI): 2.36 cm^2. Valve area (Vmax): 2.2   cm^2. Valve area (Vmean): 2.23 cm^2. - Mitral valve: There was mild regurgitation. -  Atrial septum: There was increased thickness of the septum,   consistent with lipomatous hypertrophy. No defect or patent   foramen ovale was identified. - Technically adequate study.    ASSESSMENT:    1. Hyperlipidemia, unspecified hyperlipidemia type   2. Coronary artery disease involving native coronary artery of native heart without angina pectoris   3. Essential hypertension      PLAN:  In order of problems listed above:  1. Hyperlipidemia: Patient was recently started on Zetia about a week ago, continue on current medication.  Repeat fasting lipid panel and liver function test in 48-month 2. CAD: denies any recent chest discomfort.  Continue aspirin and Plavix  3. Hypertension: Blood pressure stable    Medication Adjustments/Labs and Tests Ordered: Current medicines are reviewed at length with the patient today.  Concerns regarding medicines are outlined above.  Medication changes, Labs and Tests ordered today are listed in the Patient Instructions below. Patient Instructions  Medication Instructions:  Your physician recommends that you continue on your current medications as directed. Please refer to the Current Medication list given to you today.  If you need a refill on your cardiac medications before your next appointment, please call your pharmacy.   Lab work: You will need to return to the office in 3 months to have labs (blood work) drawn:  Fasting Lipid-Do not eat or drink past midnight  Liver Function   If you have labs (blood work) drawn today and your tests are completely normal, you will receive your results only by: .Marland KitchenMyChart Message (if you have MyChart) OR . A paper copy in the mail If you have any lab test that is abnormal or we need to change your treatment, we will call you to review the results.   Testing/Procedures: NONE ordered at this time of appointment   Follow-Up: [facility] and your [facility] needs are our [facility].  As part of our continuing mission to provide you with exceptional heart care, we have created designated Provider Care Teams.  These Care Teams include your primary Cardiologist (physician) and Advanced Practice Providers (APPs -  Physician Assistants and Nurse Practitioners) who all work together to provide you with the care you need, when you need it. You will need a follow up appointment in 6 months.  Please call our office 2 months in advance to schedule this appointment.  You may see TShelva Majestic MD or one of the following Advanced Practice Providers on your designated Care Team: [place]. AF[place]ian Sharp PA-C  Any Other Special Instructions Will Be Listed Below (If Applicable).       SHilbert Corrigan PUtah 06/16/2019 1:45 AM    [email or web address]  [id number]: [phone] Fax: [phone]

## 2019-06-16 ENCOUNTER — Encounter: Payer: Self-pay | Admitting: Physician Assistant

## 2019-07-22 DIAGNOSIS — H401114 Primary open-angle glaucoma, right eye, indeterminate stage: Secondary | ICD-10-CM | POA: Diagnosis not present

## 2019-08-23 DIAGNOSIS — I10 Essential (primary) hypertension: Secondary | ICD-10-CM | POA: Diagnosis not present

## 2019-08-23 DIAGNOSIS — F5089 Other specified eating disorder: Secondary | ICD-10-CM | POA: Diagnosis not present

## 2019-08-23 DIAGNOSIS — J060 Acute laryngopharyngitis: Secondary | ICD-10-CM | POA: Diagnosis not present

## 2019-08-23 DIAGNOSIS — E782 Mixed hyperlipidemia: Secondary | ICD-10-CM | POA: Diagnosis not present

## 2019-08-23 DIAGNOSIS — M791 Myalgia, unspecified site: Secondary | ICD-10-CM | POA: Diagnosis not present

## 2019-08-23 DIAGNOSIS — I2510 Atherosclerotic heart disease of native coronary artery without angina pectoris: Secondary | ICD-10-CM | POA: Diagnosis not present

## 2019-08-23 DIAGNOSIS — Z6826 Body mass index (BMI) 26.0-26.9, adult: Secondary | ICD-10-CM | POA: Diagnosis not present

## 2019-08-23 DIAGNOSIS — Z6827 Body mass index (BMI) 27.0-27.9, adult: Secondary | ICD-10-CM | POA: Diagnosis not present

## 2019-08-23 DIAGNOSIS — J309 Allergic rhinitis, unspecified: Secondary | ICD-10-CM | POA: Diagnosis not present

## 2019-08-23 DIAGNOSIS — R05 Cough: Secondary | ICD-10-CM | POA: Diagnosis not present

## 2019-08-23 DIAGNOSIS — J029 Acute pharyngitis, unspecified: Secondary | ICD-10-CM | POA: Diagnosis not present

## 2019-08-23 DIAGNOSIS — K219 Gastro-esophageal reflux disease without esophagitis: Secondary | ICD-10-CM | POA: Diagnosis not present

## 2019-08-23 DIAGNOSIS — E663 Overweight: Secondary | ICD-10-CM | POA: Diagnosis not present

## 2019-09-01 ENCOUNTER — Other Ambulatory Visit: Payer: Self-pay

## 2019-09-01 ENCOUNTER — Emergency Department (HOSPITAL_COMMUNITY): Payer: Medicare Other

## 2019-09-01 ENCOUNTER — Emergency Department (HOSPITAL_COMMUNITY)
Admission: EM | Admit: 2019-09-01 | Discharge: 2019-09-01 | Disposition: A | Discharge status: Home / Self Care | Payer: Medicare Other | Attending: Emergency Medicine | Admitting: Emergency Medicine

## 2019-09-01 ENCOUNTER — Encounter (HOSPITAL_COMMUNITY): Payer: Self-pay | Admitting: Emergency Medicine

## 2019-09-01 DIAGNOSIS — Z79899 Other long term (current) drug therapy: Secondary | ICD-10-CM | POA: Diagnosis not present

## 2019-09-01 DIAGNOSIS — M25512 Pain in left shoulder: Secondary | ICD-10-CM | POA: Diagnosis not present

## 2019-09-01 DIAGNOSIS — M5489 Other dorsalgia: Secondary | ICD-10-CM | POA: Diagnosis present

## 2019-09-01 DIAGNOSIS — M791 Myalgia, unspecified site: Secondary | ICD-10-CM | POA: Diagnosis not present

## 2019-09-01 DIAGNOSIS — M792 Neuralgia and neuritis, unspecified: Secondary | ICD-10-CM | POA: Insufficient documentation

## 2019-09-01 DIAGNOSIS — I259 Chronic ischemic heart disease, unspecified: Secondary | ICD-10-CM | POA: Diagnosis not present

## 2019-09-01 DIAGNOSIS — R071 Chest pain on breathing: Secondary | ICD-10-CM | POA: Diagnosis not present

## 2019-09-01 DIAGNOSIS — Z87891 Personal history of nicotine dependence: Secondary | ICD-10-CM | POA: Diagnosis not present

## 2019-09-01 DIAGNOSIS — M546 Pain in thoracic spine: Secondary | ICD-10-CM | POA: Diagnosis not present

## 2019-09-01 DIAGNOSIS — I10 Essential (primary) hypertension: Secondary | ICD-10-CM | POA: Diagnosis not present

## 2019-09-01 LAB — CBC
HCT: 40.5 % (ref 36.0–46.0)
Hemoglobin: 12.9 g/dL (ref 12.0–15.0)
MCH: 30.4 pg (ref 26.0–34.0)
MCHC: 31.9 g/dL (ref 30.0–36.0)
MCV: 95.5 fL (ref 80.0–100.0)
Platelets: 263 10*3/uL (ref 150–400)
RBC: 4.24 MIL/uL (ref 3.87–5.11)
RDW: 13.9 % (ref 11.5–15.5)
WBC: 8.4 10*3/uL (ref 4.0–10.5)
nRBC: 0 % (ref 0.0–0.2)

## 2019-09-01 LAB — BASIC METABOLIC PANEL
Anion gap: 9 (ref 5–15)
BUN: 11 mg/dL (ref 8–23)
CO2: 25 mmol/L (ref 22–32)
Calcium: 8.9 mg/dL (ref 8.9–10.3)
Chloride: 106 mmol/L (ref 98–111)
Creatinine, Ser: 0.73 mg/dL (ref 0.44–1.00)
GFR calc Af Amer: >60 mL/min (ref >60)
GFR calc non Af Amer: >60 mL/min (ref >60)
Glucose, Bld: 101 mg/dL — ABNORMAL HIGH (ref 70–99)
Potassium: 4.1 mmol/L (ref 3.5–5.1)
Sodium: 140 mmol/L (ref 135–145)

## 2019-09-01 LAB — TROPONIN I (HIGH SENSITIVITY)
Troponin I (High Sensitivity): 3 ng/L (ref <18)
Troponin I (High Sensitivity): 4 ng/L (ref <18)

## 2019-09-01 IMAGING — DX DG CHEST 2V
2 series · 2 of 2 positions shown · non-contrast
Comparison: Radiographs [DATE].

CLINICAL DATA: Acute left scapular pain.

EXAM:
CHEST - 2 VIEW

[chest lat]
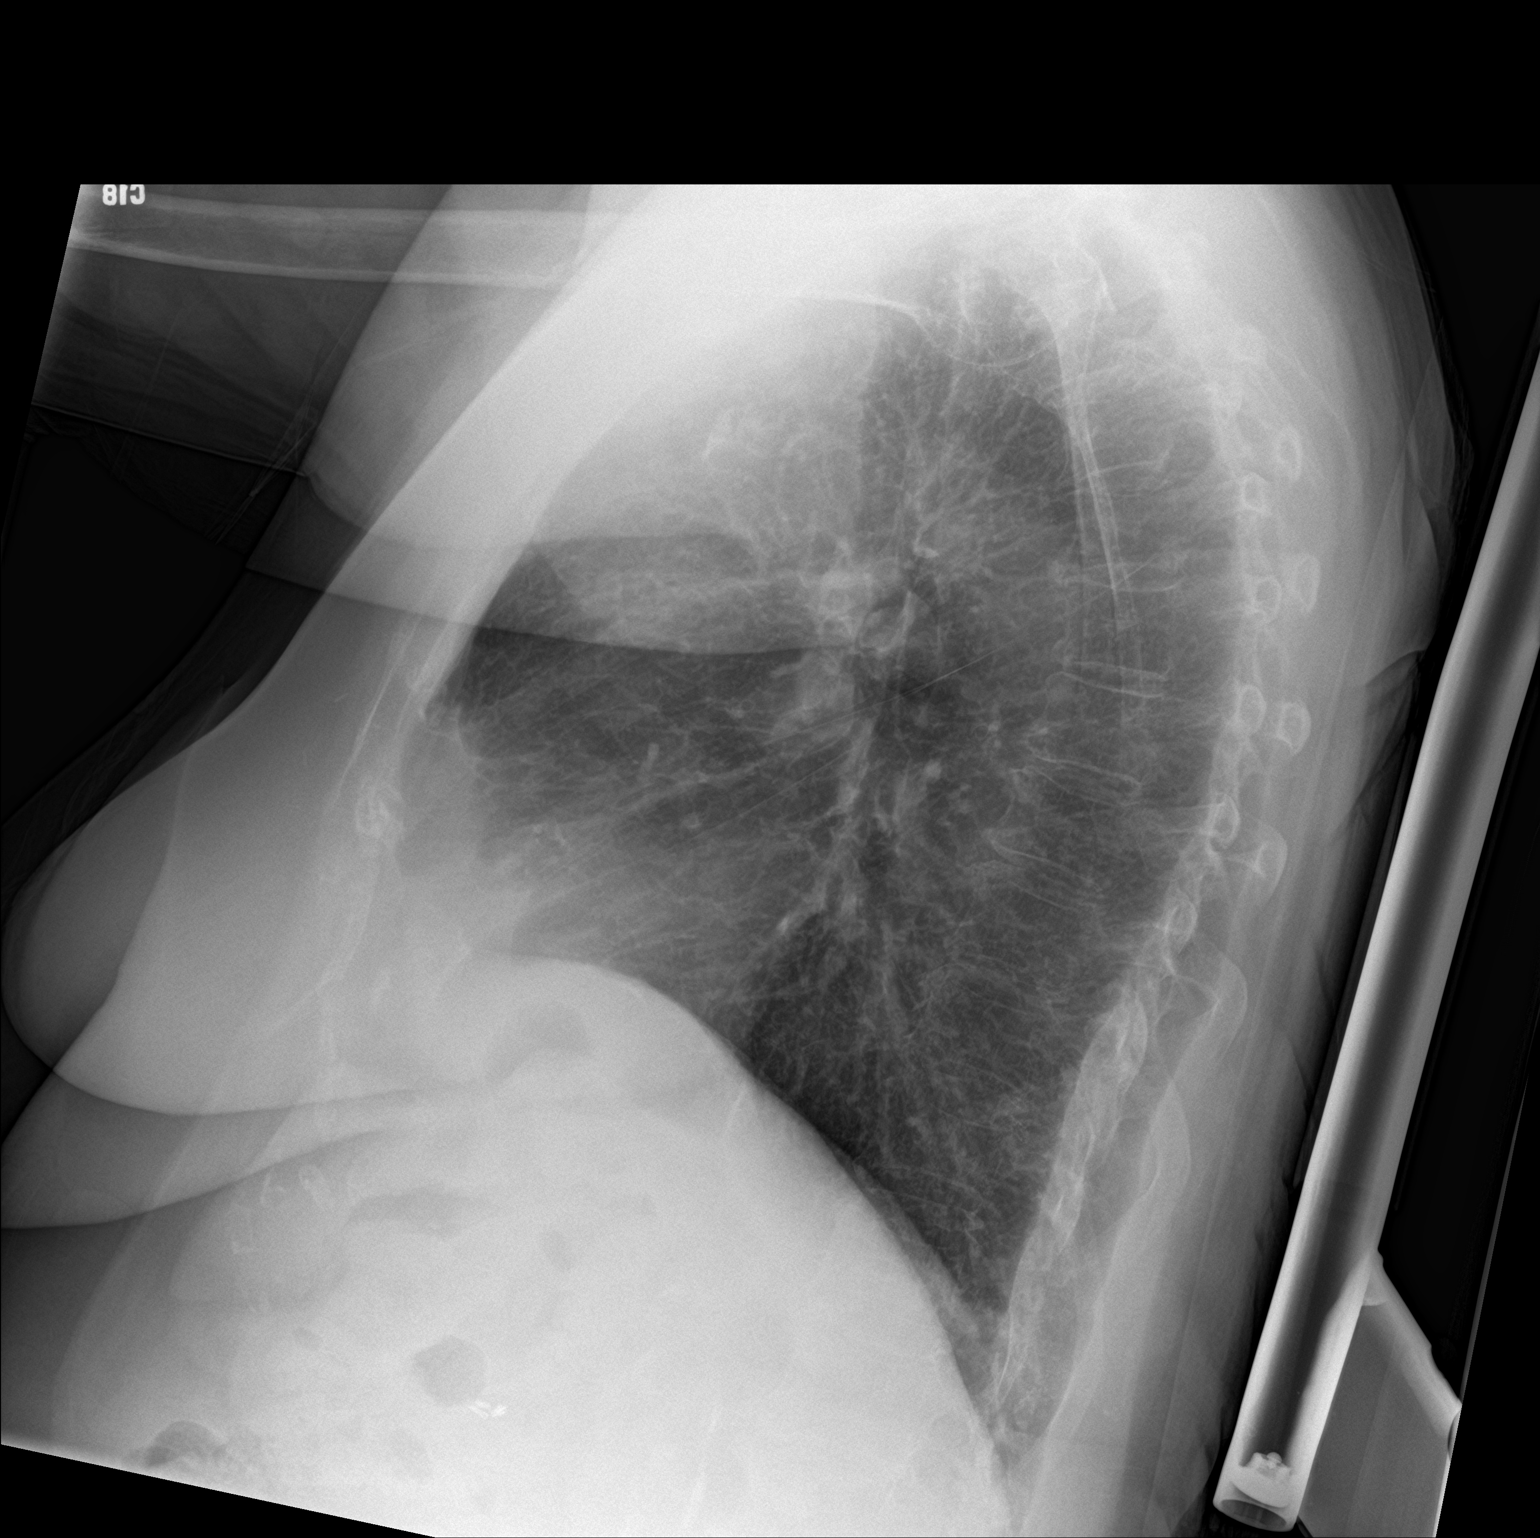

[chest ap]
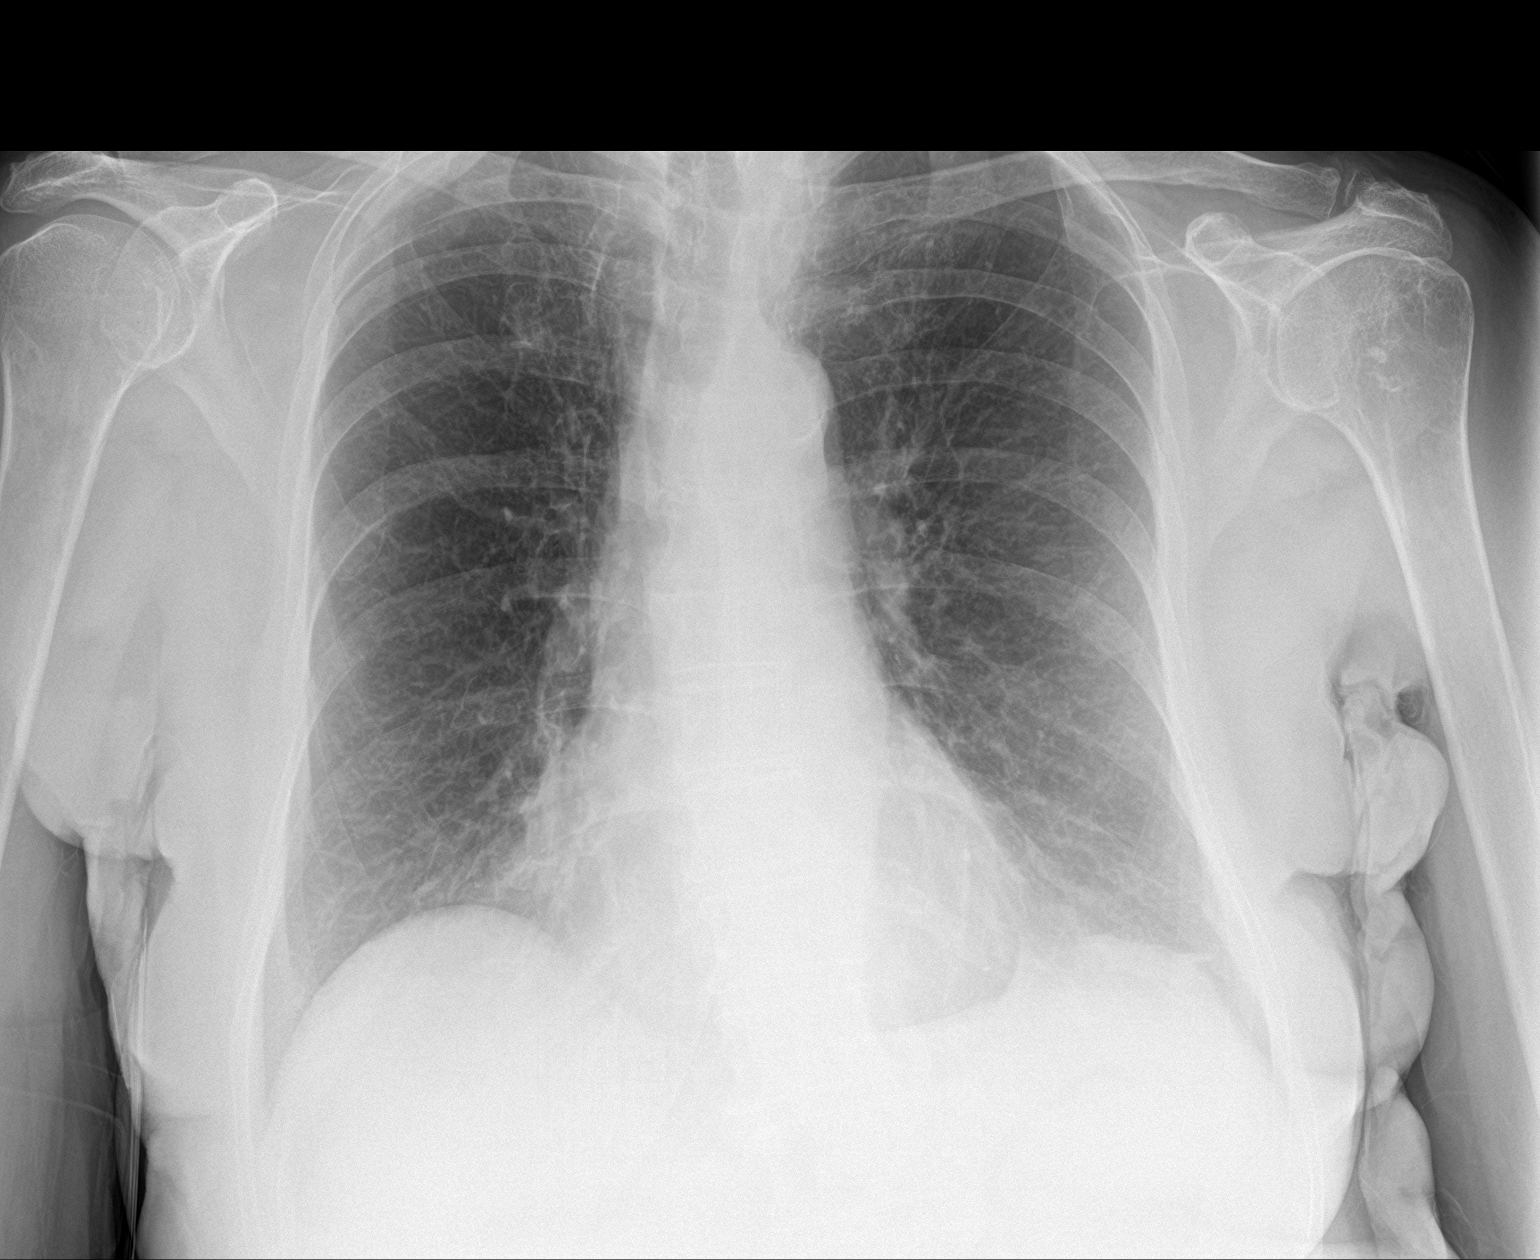

[2 of 2 positions shown; findings below may reference images not displayed]

FINDINGS: The heart size and mediastinal contours are within normal limits.
Both lungs are clear. The visualized skeletal structures are
unremarkable.
IMPRESSION: No active cardiopulmonary disease.

## 2019-09-01 MED ORDER — GABAPENTIN 100 MG PO CAPS: 100.0000 mg | ORAL_CAPSULE | Freq: Three times a day (TID) | ORAL | 0 refills | Status: DC

## 2019-09-01 MED ORDER — HYDROCODONE-ACETAMINOPHEN 5-325 MG PO TABS
1.0000 | ORAL_TABLET | Freq: Once | ORAL | Status: AC
  Administered 2019-09-01: 1 via ORAL
  Filled 2019-09-01: qty 1

## 2019-09-01 MED ORDER — HYDROCODONE-ACETAMINOPHEN 5-325 MG PO TABS: 1.0000 | ORAL_TABLET | Freq: Four times a day (QID) | ORAL | 0 refills | Status: DC | PRN

## 2019-09-01 MED ORDER — HYDROCODONE-ACETAMINOPHEN 5-325 MG PO TABS: 2.0000 | ORAL_TABLET | ORAL | 0 refills | Status: DC | PRN

## 2019-09-01 NOTE — ED Triage Notes (Signed)
Pain to LT scapula area x 2 days worse today

## 2019-09-01 NOTE — ED Provider Notes (Signed)
[facility] EMERGENCY DEPARTMENT Provider Note   CSN: [id number] Arrival date & time: 09/01/19  1227     History   Chief Complaint Chief Complaint  Patient presents with  . Back Pain    HPI Kara Woods is a 85 y.o. female.     HPI Patient presents with exquisite pain of her left thoracic back that wraps around to her left breast.  Pain started 2 days ago.  Pain was preceded by itching in the same area.  No known injury.  States the pain is worse with minor movements or light touch.  No shortness of breath.  No chest pain.  No cough, fever or chills.  No new lower extremity swelling or pain.  No new rashes. Past Medical History:  Diagnosis Date  . Anxiety   . Arthritis   . Blind left eye   . Breast cancer ([place])    left   . Colitis   . Coronary artery disease    a. 10/2016 Staged PCI: RCA 50p, 75m(2.25x12 Resolute Onyx DES), LCX 50p, 861m2.75x23 Xience Alpine DES). Residual LAD 50p/m, 45107m  . CMarland Kitchenstocele 10/11/2013  . Depression   . Diastolic dysfunction    a. 09/2016 Echo: EF 50%, diffuse hypokinesis, mild LVH, grade 1 diastolic dysfunction, mild mitral regurgitation, mildly dilated left atrium.  . GMarland KitchenRD (gastroesophageal reflux disease)    occasional Tums only  . HOH (hard of hearing)   . Pelvic relaxation 10/11/2013  . Proctitis    colonsocopy 2007, Canasa suppositories  . S/P endoscopy March 2009   mild erosive reflux esophagitis, Schatzki's ring, s/p dilation  . Schatzki's ring   . Shortness of breath    occ if anxiety  . Wears dentures    upper  . Wears glasses     Patient Active Problem List   Diagnosis Date Noted  . Dizziness 11/19/2016  . CAD (coronary artery disease) 11/06/2016  . Essential hypertension 11/06/2016  . Unstable angina ([facility]2/09/2016  . S/P vaginal hysterectomy 05/17/2014  . Cystocele 10/11/2013  . Pelvic relaxation 10/11/2013  . Cancer of left breast ([facility]5/27/2014  . After cataract, bilateral 04/07/2013  . Corneal edema  04/07/2013  . Dry eyes 04/07/2013  . Diarrhea 06/23/2012  . Schatzki's ring 07/16/2011  . COLLES' FRACTURE, LEFT 04/04/2010  . CHEST PAIN UNSPECIFIED 09/18/2009  . HYPERLIPIDEMIA 07/28/2008  . ANXIETY 07/28/2008  . MACULAR DEGENERATION, BILATERAL 07/28/2008  . CAD 07/28/2008  . GERD 07/28/2008  . PROCTITIS 07/28/2008  . OSTEOPENIA 07/28/2008  . RETINAL DETACHMENT, LEFT EYE, HX OF 07/28/2008    Past Surgical History:  Procedure Laterality Date  . ANTERIOR AND POSTERIOR REPAIR N/A 05/17/2014   Procedure: ANTERIOR (CYSTOCELE) AND POSTERIOR REPAIR (RECTOCELE);  Surgeon: ScoReece PackerD;  [facility];  Service: Urology;  Laterality: N/A;  . BREAST LUMPECTOMY WITH NEEDLE LOCALIZATION AND AXILLARY SENTINEL LYMPH NODE BX Left 05/03/2013   Procedure: BREAST LUMPECTOMY WITH NEEDLE LOCALIZATION AND AXILLARY SENTINEL LYMPH NODE BX;  Surgeon: BenEdward JollyD;  [facility];  Laterality: Left;  . BREAST SURGERY Left 05/19/13  . CARDIAC CATHETERIZATION  1998  . CARDIAC CATHETERIZATION N/A 11/04/2016   Procedure: Left Heart Cath and Coronary Angiography;  Surgeon: ThoTroy SineD;  Location: [facility];  Service: Cardiovascular;  Laterality: N/A;  . CARDIAC CATHETERIZATION N/A 11/05/2016   Procedure: Coronary Stent Intervention;  Surgeon: ThoTroy SineD;  Location: [facility];  Service: Cardiovascular;  Laterality: N/A;  . COLONOSCOPY  05/06/2006   Diffuse inflammatory changes of the rectal mucosa, consistent  with proctitis.  Otherwise, normal colon to terminal ileum  . CORONARY ANGIOPLASTY  11/04/2016  . CORONARY STENT PLACEMENT     Drug-eluting coronary artery stent, non-bioabsorbable-polymer-coated  . CYSTOSCOPY N/A 05/17/2014   Procedure: CYSTOSCOPY;  Surgeon: Reece Packer, MD;  Location: [facility];  Service: Urology;  Laterality: N/A;  . ESOPHAGOGASTRODUODENOSCOPY  02/09/2008   Schatzki ring status post  dilation/Distal esophageal erosion consistent with mild erosive reflux  esophagitis, otherwise unremarkable esophagus, normal stomach, D1, D2.  . EYE SURGERY     lt cataract-implant  . EYE SURGERY     lt-lens repaced,l  . [place] SURGERY  1993  . RE-EXCISION OF BREAST LUMPECTOMY Left 05/19/2013   Procedure: RE-EXCISION OF BREAST LUMPECTOMY;  Surgeon: Edward Jolly, MD;  Location: [place];  Service: General;  Laterality: Left;  . RETINAL DETACHMENT SURGERY  1978   Left  . VAGINAL HYSTERECTOMY Bilateral 05/17/2014   Procedure: HYSTERECTOMY VAGINAL with Bilateral Salpingo-Oophorectomy; Bladder cystotomy repair;  Surgeon: Marvene Staff, MD;  Location: [facility];  Service: Gynecology;  Laterality: Bilateral;  . VAGINAL PROLAPSE REPAIR N/A 05/17/2014   Procedure: VAGINAL VAULT PROLAPSE AND GRAFT;  Surgeon: Reece Packer, MD;  Location: [facility];  Service: Urology;  Laterality: N/A;     OB History    Gravida  6   Para  6   Term      Preterm      AB      Living  5     SAB      TAB      Ectopic      Multiple      Live Births  6            Home Medications    Prior to Admission medications   Medication Sig Start Date End Date Taking? Authorizing Provider  acetaminophen (TYLENOL) 500 MG tablet Take 500-1,000 mg by mouth every 6 (six) hours as needed for mild pain.    [provider]  aspirin EC 81 MG tablet Take 1 tablet (81 mg total) by mouth daily. 10/31/16   Troy Sine, MD  carboxymethylcellulose (REFRESH PLUS) 0.5 % SOLN Place 3-4 drops into both eyes 3 (three) times daily as needed (dry eyes). Preservative free    [provider]  clopidogrel (PLAVIX) 75 MG tablet  12/17/18   [provider]  ezetimibe (ZETIA) 10 MG tablet Take 1 tablet (10 mg total) by mouth daily. 06/03/19 09/01/19  Troy Sine, MD  gabapentin (NEURONTIN) 100 MG capsule Take 1 capsule (100 mg total) by mouth 3 (three) times daily.  09/01/19   Julianne Rice, MD  HYDROcodone-acetaminophen (NORCO) 5-325 MG tablet Take 2 tablets by mouth every 4 (four) hours as needed. 09/01/19   Julianne Rice, MD  metoprolol tartrate (LOPRESSOR) 25 MG tablet TAKE 1/2 TABLET BY MOUTH TWICE DAILY. 03/30/19   Troy Sine, MD  nitroGLYCERIN (NITROSTAT) 0.4 MG SL tablet Place 1 tablet (0.4 mg total) under the tongue every 5 (five) minutes as needed. 11/06/16   Cheryln Manly, NP  Travoprost, BAK Free, (TRAVATAN) 0.004 % SOLN ophthalmic solution Place 1 drop into the right eye at bedtime.     [provider]    Family History Family History  Problem Relation Age of Onset  . Cancer Brother  spinal  . Multiple myeloma Daughter   . Colon cancer Neg Hx     Social History Social History   Tobacco Use  . Smoking status: Former Smoker    Packs/day: 1.00    Types: Cigarettes    Quit date: 04/28/1990    Years since quitting: 29.3  . Smokeless tobacco: Never Used  . Tobacco comment: quit 19 yrs ago  Substance Use Topics  . Alcohol use: Yes    Comment: occ  . Drug use: No     Allergies   Contrast media [iodinated diagnostic agents], Lipitor [atorvastatin], Sulfamethoxazole, and Sulfonamide derivatives   Review of Systems Review of Systems  Constitutional: Negative for chills and fever.  HENT: Negative for sore throat and trouble swallowing.   Respiratory: Negative for cough and shortness of breath.   Cardiovascular: Negative for chest pain.  Gastrointestinal: Negative for abdominal pain, diarrhea and nausea.  Genitourinary: Negative for dysuria, flank pain, frequency and hematuria.  Musculoskeletal: Positive for back pain. Negative for neck pain and neck stiffness.  Skin: Negative for rash and wound.  Neurological: Negative for dizziness, weakness, light-headedness, numbness and headaches.     Physical Exam Updated Vital Signs BP (!) 146/71   Pulse 77   Temp 98.5 F (36.9 C)   Resp (!) 26   Ht _0   (1.676 m)   Wt 72.6 kg   SpO2 99%   BMI 25.82 kg/m   Physical Exam Vitals signs and nursing note reviewed.  Constitutional:      Appearance: Normal appearance. She is well-developed.  HENT:     Head: Normocephalic and atraumatic.     Nose: Nose normal.     Mouth/Throat:     Mouth: Mucous membranes are moist.  Eyes:     Pupils: Pupils are equal, round, and reactive to light.  Neck:     Musculoskeletal: Normal range of motion and neck supple. No neck rigidity or muscular tenderness.  Cardiovascular:     Rate and Rhythm: Normal rate and regular rhythm.     Heart sounds: No murmur. No friction rub. No gallop.   Pulmonary:     Effort: Pulmonary effort is normal. No respiratory distress.     Breath sounds: Normal breath sounds. No stridor. No wheezing, rhonchi or rales.  Chest:     Chest wall: No tenderness.  Abdominal:     General: Bowel sounds are normal.     Palpations: Abdomen is soft.     Tenderness: There is no abdominal tenderness. There is no right CVA tenderness, guarding or rebound.  Musculoskeletal: Normal range of motion.        General: Tenderness present.     Comments: Exquisite tenderness to very light palpation of the skin in the region of the mid left thoracic back wrapping to the left lateral chest.  There is no crepitance or deformity.  No midline tenderness or step-offs.  No obvious rashes.  No lower extremity swelling, asymmetry or tenderness.  Distal pulses are 2+.  Lymphadenopathy:     Cervical: No cervical adenopathy.  Skin:    General: Skin is warm and dry.     Findings: No erythema or rash.  Neurological:     General: No focal deficit present.     Mental Status: She is alert and oriented to person, place, and time.     Comments: 5/5 motor in all extremities.  Sensation intact.  Psychiatric:        Behavior: Behavior normal.  ED Treatments / Results  Labs (all labs ordered are listed, but only abnormal results are displayed) Labs Reviewed   BASIC METABOLIC PANEL - Abnormal; Notable for the following components:      Result Value   Glucose, Bld 101 (*)    All other components within normal limits  CBC  TROPONIN I (HIGH SENSITIVITY)  TROPONIN I (HIGH SENSITIVITY)    EKG EKG Interpretation  Date/Time:  Wednesday September 01 2019 13:04:16 EDT Ventricular Rate:  87 PR Interval:  148 QRS Duration: 74 QT Interval:  382 QTC Calculation: 459 R Axis:   65 Text Interpretation:  Normal sinus rhythm with sinus arrhythmia no acute ST/T changes similar to April 2019 Confirmed by Sherwood Gambler [phone]) on 09/01/2019 2:42:33 PM   Radiology Dg Chest 2 View  Result Date: 09/01/2019 CLINICAL DATA:  Acute left scapular pain. EXAM: CHEST - 2 VIEW COMPARISON:  Radiographs of March 03, 2018. FINDINGS: The heart size and mediastinal contours are within normal limits. Both lungs are clear. The visualized skeletal structures are unremarkable. IMPRESSION: No active cardiopulmonary disease. Electronically Signed   By: Marijo Conception M.D.   On: 09/01/2019 13:43    Procedures Procedures (including critical care time)  Medications Ordered in ED Medications  HYDROcodone-acetaminophen (NORCO/VICODIN) 5-325 MG per tablet 1 tablet (1 tablet Oral Given 09/01/19 1610)     Initial Impression / Assessment and Plan / ED Course  I have reviewed the triage vital signs and the nursing notes.  Pertinent labs & imaging results that were available during my care of the patient were reviewed by me and considered in my medical decision making (see chart for details).        Suspect patient likely has neuralgia related to herpes zoster infection.  Low suspicion for intrathoracic and especially cardiac etiology.  Laboratory work-up is normal.  Normal chest x-ray.  EKG without concerning findings. However without rash would probably hold starting steroids and antiviral medication until illness declare itself.  She understands the need to return immediately  for any worsening of her symptoms or concerns.  Final Clinical Impressions(s) / ED Diagnoses   Final diagnoses:  Neuralgia    ED Discharge Orders         Ordered    HYDROcodone-acetaminophen (NORCO) 5-325 MG tablet  Every 4 hours PRN     09/01/19 1712    gabapentin (NEURONTIN) 100 MG capsule  3 times daily     09/01/19 1712           Julianne Rice, MD 09/01/19 1712

## 2019-09-01 NOTE — ED Notes (Signed)
Pt's daughter, Armida Sans called to ask if pt needed to be taking valtrex; Dr. Lita Mains informed, and he stated he told pt he was treating her symptoms; he stated if pt developed a rash she would need to follow up with her PCP and be prescribed the valtrex and prednisone; pt's daughter verbalized understanding

## 2019-09-07 DIAGNOSIS — Z23 Encounter for immunization: Secondary | ICD-10-CM | POA: Diagnosis not present

## 2019-09-07 DIAGNOSIS — M5010 Cervical disc disorder with radiculopathy, unspecified cervical region: Secondary | ICD-10-CM | POA: Diagnosis not present

## 2019-10-08 ENCOUNTER — Other Ambulatory Visit: Payer: Self-pay | Admitting: Internal Medicine

## 2019-10-08 ENCOUNTER — Other Ambulatory Visit (HOSPITAL_COMMUNITY): Payer: Self-pay | Admitting: Internal Medicine

## 2019-10-08 DIAGNOSIS — M5114 Intervertebral disc disorders with radiculopathy, thoracic region: Secondary | ICD-10-CM | POA: Diagnosis not present

## 2019-10-08 DIAGNOSIS — M5414 Radiculopathy, thoracic region: Secondary | ICD-10-CM

## 2019-10-08 DIAGNOSIS — M5010 Cervical disc disorder with radiculopathy, unspecified cervical region: Secondary | ICD-10-CM | POA: Diagnosis not present

## 2019-10-08 DIAGNOSIS — M5412 Radiculopathy, cervical region: Secondary | ICD-10-CM

## 2019-10-27 ENCOUNTER — Ambulatory Visit (HOSPITAL_COMMUNITY): Payer: Medicare Other

## 2019-10-27 ENCOUNTER — Encounter (HOSPITAL_COMMUNITY): Payer: Self-pay

## 2019-12-02 ENCOUNTER — Ambulatory Visit: Payer: Medicare Other | Admitting: Cardiovascular Disease

## 2019-12-08 ENCOUNTER — Telehealth: Payer: Self-pay | Admitting: Cardiovascular Disease

## 2019-12-08 NOTE — Telephone Encounter (Signed)
New Message:    Daughter would like to know what is Dr [name] recommendation on  Whether pt should take the COVID Vaccine.  She also wants to know if pt takes the vaccine, will she need to stop her blood thinner?

## 2019-12-09 NOTE — Telephone Encounter (Signed)
Leave detailed message on pt daughter voicemail(DPR) about information of Covid vaccine and anticoagulant therapy. Also information on vaccine was send to pt MyChart

## 2019-12-18 DIAGNOSIS — Z23 Encounter for immunization: Secondary | ICD-10-CM | POA: Diagnosis not present

## 2020-01-04 ENCOUNTER — Ambulatory Visit: Payer: Medicare Other | Admitting: Cardiovascular Disease

## 2020-01-07 ENCOUNTER — Encounter: Payer: Self-pay | Admitting: Cardiovascular Disease

## 2020-01-07 ENCOUNTER — Other Ambulatory Visit: Payer: Self-pay

## 2020-01-07 ENCOUNTER — Ambulatory Visit (INDEPENDENT_AMBULATORY_CARE_PROVIDER_SITE_OTHER): Payer: Medicare Other | Admitting: Cardiovascular Disease

## 2020-01-07 VITALS — BP 109/74 | HR 68 | Temp 96.8°F | Ht 66.0 in | Wt 169.4 lb

## 2020-01-07 DIAGNOSIS — I10 Essential (primary) hypertension: Secondary | ICD-10-CM | POA: Diagnosis not present

## 2020-01-07 DIAGNOSIS — Z79899 Other long term (current) drug therapy: Secondary | ICD-10-CM

## 2020-01-07 DIAGNOSIS — E785 Hyperlipidemia, unspecified: Secondary | ICD-10-CM

## 2020-01-07 DIAGNOSIS — I519 Heart disease, unspecified: Secondary | ICD-10-CM

## 2020-01-07 DIAGNOSIS — I2510 Atherosclerotic heart disease of native coronary artery without angina pectoris: Secondary | ICD-10-CM

## 2020-01-07 NOTE — Patient Instructions (Signed)
Medication Instructions:  TAKE YOUR ZETIA DAILY! *If you need a refill on your cardiac medications before your next appointment, please call your pharmacy*  Lab Work: IN 6 MONTHS FASTING LABS: [facility] LIPID TSH CBC If you have labs (blood work) drawn today and your tests are completely normal, you will receive your results only by: Marland Kitchen MyChart Message (if you have MyChart) OR . A paper copy in the mail If you have any lab test that is abnormal or we need to change your treatment, we will call you to review the results.  Follow-Up: At [facility], you and your health needs are our priority.  As part of our continuing mission to provide you with exceptional heart care, we have created designated Provider Care Teams.  These Care Teams include your primary Cardiologist (physician) and Advanced Practice Providers (APPs -  Physician Assistants and Nurse Practitioners) who all work together to provide you with the care you need, when you need it.  Your next appointment:   6 month(s)  The format for your next appointment:   In Person  Provider:   Shelva Majestic, MD

## 2020-01-07 NOTE — Progress Notes (Addendum)
Cardiology Office Note    Date:  01/08/2020   ID:  Kara, Woods 1933/03/25, MRN [id number]  PCP:  Celene Squibb, MD  Referring M.D.: Dr. Velvet Bathe Cardiologist:  Shelva Majestic, MD    History of Present Illness:  Kara Woods is a 86 y.o. female who presents for a 12 month follow-up cardiology evaluation   Kara Woods is a mother of my patient, who had suffered an out of hospital cardiac arrest years ago.  Kara Woods has a history of prior cardiac catheterization and from records [name] had undergone cardiac catheterization in 1998.  The patient states her coronaries were clean.  I do not have specifics of her catheterization findings.  Over the past 6 months, she has developed progressive decline in ability to be active and has noticed significant increasing exertionally precipitated shortness of breath.  She also has had some episodes of associated chest pressure with left arm radiation.  She had undergone an echo Doppler study on 10/24/2016 which showed an EF of 50% and suggested diffuse hypokinesis.  There was grade 1 diastolic dysfunction.  There was mild MR and mild LA dilatation.  She also had undergone pulmonary function testing was not significantly abnormal.  Her past history is notable for left breast CA and she is status post prior lumpectomy and radiation treatment.  She was cared for by Dr. Excell Seltzer.  She also is status post hysterectomy a remote history of cholecystectomy.  She has a history of retinal detachment.  When I saw her, due to progressive symptomatology with decline in exercise tolerance and increasing exertional dyspnea with a very strong family history of CAD I recommended definitive cardiac catheterization.  This was performed by me on 11/04/2016 and revealed low normal global LV function with an EF of 50-55% with mild mid inferior focal hypocontractility.  There was multivessel CAD with multiple stenoses of 40-50% mid to distal LAD.  She had a normal  ramus intermediate vessel.  There was 80% focal stenosis in the left circumflex and 95% eccentric mid RCA stenosis followed by 50% narrowing.  She underwent initial PCI to the mid RCA with insertion of a 2.2512 mm Resolute Onyx stent postdilated to 2.5 mm with the 95% stenoses being reduced to 0%.  The following day, on 11/05/2016 she underwent staged intervention to the left circumflex flex artery with the 50 and 85% stenoses being reduced to 0% with insertion of a 2.7523 mm  Xience Alpine DES stent postdilated to 3.0.  Since her intervention, she has felt significantly improved.  She had mild dyspnea more attributed to air hunger and this typically is relieved when she takes a deep sigh.   She was started on Lipitor but did not tolerate this.  Subsequently, I recommended initiation of Crestor at 5 mg 2 times a week.  She took 2 doses and then felt she had similar symptoms and stopped taking this.  She denies any exertional chest tightness.  She was diagnosed with a left knee Baker's cyst.  She denies palpitations.    She underwent a one-year follow-up nuclear stress test on 10/09/2017 which remained low risk and showed an EF of 51% without scar or ischemia.    She was  evaluated at [facility] emergency room when she was brought there by family members with complaints of increasing shortness of breath andvague nonexertional chest tightness.  There was concern of whether or not Brilinta could responsible.  Laboratory was negative.  She was not  found to have evidence for pneumonia or CHF and had a negative d-dimer.  She denies any episodes of chest pain since her evaluation.  She continues to experience exertional dyspnea.  He has been on aspirin and Brilinta for antiplatelet therapy, Zetia 10 mg for hyperlipidemia although when previously I suggested a trial of Livalo which she never pursued.    An echo Doppler study on Apr 03, 2018  showed an EF of 50% with grade 1 diastolic dysfunction.  There was aortic  sclerosis without stenosis, mild MR, and probable lipomatous hypertrophy of her atrial septum.  Although I again at that time had recommended institution of lipid-lowering therapy added to her Zetia with Crestor 5 mg weekly she never pursued this.  Her LV diastolic dysfunction and some shortness of breath I added spironolactone 12.5 mg.  The past several months she has felt improved.  She denies chest pain PND orthopnea.  She never started the rosuvastatin but was uncertain why. She denies bleeding.   I saw her in August 2019; she was doing  well and has been without recurrent chest pain she denies palpitations.  Apparently for some reason she had discontinued Zetia but had tolerated this well.  She has not been able to tolerate any statins.  We had even tried to initiate Livalo but she never started this.  She believes she is sleeping well but she wakes up at least 3 times per night to night.  She was unaware of snoring or gasping for breath.  During that evaluation I recommended that she resume Zetia 10 mg daily.  She will stop follow-up laboratory with Dr. Nevada Crane.  Since I last saw her, she states that she only intermittently takes Zetia but typically has only been taking perhaps 1 time a week.  Laboratory in July 2020 showed an LDL cholesterol of 130.  She just recently underwent laboratory by Dr. Nevada Crane in January 2021.  At that time, total cholesterol was 198, triglycerides 164 and HDL 44 but I do not yet know her LDL cholesterol.  We will try to obtain these results from Dr. Nevada Crane.  She does note occasional swelling in her hands.  She denies myalgias.  She denies leg swelling.  At times she feels like there is a belt around her chest.  This is nonexertional.  She denies acute shortness of breath.  She is unaware of palpitations.  She presents for reevaluation.   Past Medical History:  Diagnosis Date  . Anxiety   . Arthritis   . Blind left eye   . Breast cancer ([place])    left   . Colitis   . Coronary  artery disease    a. 10/2016 Staged PCI: RCA 50p, 29m(2.25x12 Resolute Onyx DES), LCX 50p, 848m2.75x23 Xience Alpine DES). Residual LAD 50p/m, 4560m  . CMarland Kitchenstocele 10/11/2013  . Depression   . Diastolic dysfunction    a. 09/2016 Echo: EF 50%, diffuse hypokinesis, mild LVH, grade 1 diastolic dysfunction, mild mitral regurgitation, mildly dilated left atrium.  . GMarland KitchenRD (gastroesophageal reflux disease)    occasional Tums only  . HOH (hard of hearing)   . Pelvic relaxation 10/11/2013  . Proctitis    colonsocopy 2007, Canasa suppositories  . S/P endoscopy March 2009   mild erosive reflux esophagitis, Schatzki's ring, s/p dilation  . Schatzki's ring   . Shortness of breath    occ if anxiety  . Wears dentures    upper  . Wears glasses     Past  Surgical History:  Procedure Laterality Date  . ANTERIOR AND POSTERIOR REPAIR N/A 05/17/2014   Procedure: ANTERIOR (CYSTOCELE) AND POSTERIOR REPAIR (RECTOCELE);  Surgeon: Reece Packer, MD;  Location: [facility];  Service: Urology;  Laterality: N/A;  . BREAST LUMPECTOMY WITH NEEDLE LOCALIZATION AND AXILLARY SENTINEL LYMPH NODE BX Left 05/03/2013   Procedure: BREAST LUMPECTOMY WITH NEEDLE LOCALIZATION AND AXILLARY SENTINEL LYMPH NODE BX;  Surgeon: Edward Jolly, MD;  Location: [facility];  Service: General;  Laterality: Left;  . BREAST SURGERY Left 05/19/13  . CARDIAC CATHETERIZATION  1998  . CARDIAC CATHETERIZATION N/A 11/04/2016   Procedure: Left Heart Cath and Coronary Angiography;  Surgeon: Troy Sine, MD;  Location: [facility];  Service: Cardiovascular;  Laterality: N/A;  . CARDIAC CATHETERIZATION N/A 11/05/2016   Procedure: Coronary Stent Intervention;  Surgeon: Troy Sine, MD;  Location: [facility];  Service: Cardiovascular;  Laterality: N/A;  . COLONOSCOPY  05/06/2006   Diffuse inflammatory changes of the rectal mucosa, consistent  with proctitis.  Otherwise, normal colon to terminal ileum  .  CORONARY ANGIOPLASTY  11/04/2016  . CORONARY STENT PLACEMENT     Drug-eluting coronary artery stent, non-bioabsorbable-polymer-coated  . CYSTOSCOPY N/A 05/17/2014   Procedure: CYSTOSCOPY;  Surgeon: Reece Packer, MD;  Location: [facility];  Service: Urology;  Laterality: N/A;  . ESOPHAGOGASTRODUODENOSCOPY  02/09/2008   Schatzki ring status post dilation/Distal esophageal erosion consistent with mild erosive reflux  esophagitis, otherwise unremarkable esophagus, normal stomach, D1, D2.  . EYE SURGERY     lt cataract-implant  . EYE SURGERY     lt-lens repaced,l  . [place] SURGERY  1993  . RE-EXCISION OF BREAST LUMPECTOMY Left 05/19/2013   Procedure: RE-EXCISION OF BREAST LUMPECTOMY;  Surgeon: Edward Jolly, MD;  Location: [facility];  Service: General;  Laterality: Left;  . RETINAL DETACHMENT SURGERY  1978   Left  . VAGINAL HYSTERECTOMY Bilateral 05/17/2014   Procedure: HYSTERECTOMY VAGINAL with Bilateral Salpingo-Oophorectomy; Bladder cystotomy repair;  Surgeon: Marvene Staff, MD;  Location: [facility];  Service: Gynecology;  Laterality: Bilateral;  . VAGINAL PROLAPSE REPAIR N/A 05/17/2014   Procedure: VAGINAL VAULT PROLAPSE AND GRAFT;  Surgeon: Reece Packer, MD;  Location: [facility];  Service: Urology;  Laterality: N/A;    Current Medications: Outpatient Medications Prior to Visit  Medication Sig Dispense Refill  . acetaminophen (TYLENOL) 500 MG tablet Take 500-1,000 mg by mouth every 6 (six) hours as needed for mild pain.    Marland Kitchen aspirin EC 81 MG tablet Take 1 tablet (81 mg total) by mouth daily. 90 tablet 3  . carboxymethylcellulose (REFRESH PLUS) 0.5 % SOLN Place 3-4 drops into both eyes 3 (three) times daily as needed (dry eyes). Preservative free    . clopidogrel (PLAVIX) 75 MG tablet     . metoprolol tartrate (LOPRESSOR) 25 MG tablet TAKE 1/2 TABLET BY MOUTH TWICE DAILY. 90 tablet 2  . nitroGLYCERIN (NITROSTAT) 0.4 MG SL tablet Place 1 tablet (0.4 mg  total) under the tongue every 5 (five) minutes as needed. 25 tablet 3  . Travoprost, BAK Free, (TRAVATAN) 0.004 % SOLN ophthalmic solution Place 1 drop into the right eye at bedtime.     Marland Kitchen ezetimibe (ZETIA) 10 MG tablet Take 1 tablet (10 mg total) by mouth daily. 90 tablet 3  . gabapentin (NEURONTIN) 100 MG capsule Take 1 capsule (100 mg total) by mouth 3 (three) times daily. (Patient not taking: Reported on 01/07/2020) 60  capsule 0  . HYDROcodone-acetaminophen (NORCO) 5-325 MG tablet Take 1 tablet by mouth every 6 (six) hours as needed for severe pain. (Patient not taking: Reported on 01/07/2020) 10 tablet 0   No facility-administered medications prior to visit.     Allergies:   Contrast media [iodinated diagnostic agents], Lipitor [atorvastatin], Sulfamethoxazole, and Sulfonamide derivatives   Social History   Socioeconomic History  . Marital status: Widowed    Spouse name: Not on file  . Number of children: Not on file  . Years of education: Not on file  . Highest education level: Not on file  Occupational History  . Occupation: Retired  Tobacco Use  . Smoking status: Former Smoker    Packs/day: 1.00    Types: Cigarettes    Quit date: 04/28/1990    Years since quitting: 29.7  . Smokeless tobacco: Never Used  . Tobacco comment: quit 19 yrs ago  Substance and Sexual Activity  . Alcohol use: Yes    Comment: occ  . Drug use: No  . Sexual activity: Not Currently    Birth control/protection: Post-menopausal  Other Topics Concern  . Not on file  Social History Narrative  . Not on file   Social Determinants of Health   Financial Resource Strain:   . Difficulty of Paying Living Expenses: Not on file  Food Insecurity:   . Worried About Charity fundraiser in the Last Year: Not on file  . Ran Out of Food in the Last Year: Not on file  Transportation Needs:   . Lack of Transportation (Medical): Not on file  . Lack of Transportation (Non-Medical): Not on file  Physical Activity:    . Days of Exercise per Week: Not on file  . Minutes of Exercise per Session: Not on file  Stress:   . Feeling of Stress : Not on file  Social Connections:   . Frequency of Communication with Friends and Family: Not on file  . Frequency of Social Gatherings with Friends and Family: Not on file  . Attends Religious Services: Not on file  . Active Member of Clubs or Organizations: Not on file  . Attends Archivist Meetings: Not on file  . Marital Status: Not on file     Family History:  The patient's family history includes Cancer in her brother; Multiple myeloma in her daughter.  I mother died and had a myocardial infarction.  Her father also had suffered a myocardial infarction.  She had 3 sisters who also had myocardial infarction and also one of her brothers also had suffered a myocardial infarction.  ROS General: Negative; No fevers, chills, or night sweats;  HEENT: History of retinal detachment, sinus congestion, difficulty swallowing Pulmonary: Negative; No cough, wheezing, shortness of breath, hemoptysis Cardiovascular: see HPI GI: Positive for occasional loose stools GU: Negative; No dysuria, hematuria, or difficulty voiding Musculoskeletal: Positive for left Baker's cyst Hematologic/Oncology: Negative; no easy bruising, bleeding Endocrine: Negative; no heat/cold intolerance; no diabetes Neuro: Negative; no changes in balance, headaches Skin: Negative; No rashes or skin lesions Psychiatric: Negative; No behavioral problems, depression Sleep: Negative; No snoring, daytime sleepiness, hypersomnolence, bruxism, restless legs, hypnogognic hallucinations, no cataplexy Other comprehensive 14 point system review is negative.   PHYSICAL EXAM:   VS:  BP 109/74   Pulse 68   Temp (!) 96.8 F (36 C)   Ht 5' 6"  (1.676 m)   Wt 169 lb 6.4 oz (76.8 kg)   SpO2 96%   BMI 27.34 kg/m  Repeat blood pressure by me was 110/76  Wt Readings from Last 3 Encounters:  01/07/20  169 lb 6.4 oz (76.8 kg)  09/01/19 160 lb (72.6 kg)  06/14/19 171 lb (77.6 kg)    General: Alert, oriented, no distress.  Skin: normal turgor, no rashes, warm and dry HEENT: Normocephalic, atraumatic. Pupils equal round and reactive to light; sclera anicteric; extraocular muscles intact;  Nose without nasal septal hypertrophy Mouth/Parynx benign; Mallinpatti scale 3 Neck: No JVD, no carotid bruits; normal carotid upstroke Lungs: clear to ausculatation and percussion; no wheezing or rales Chest wall: No chest wall tenderness to palpation Heart: PMI not displaced, RRR, s1 s2 normal, 1/6 systolic murmur, no diastolic murmur, no rubs, gallops, thrills, or heaves Abdomen: soft, nontender; no hepatosplenomehaly, BS+; abdominal aorta nontender and not dilated by palpation. Back: no CVA tenderness Pulses 2+ Musculoskeletal: full range of motion, normal strength, no joint deformities Extremities: no clubbing cyanosis or edema, Homan's sign negative  Neurologic: grossly nonfocal; Cranial nerves grossly wnl Psychologic: Normal mood and affect   Studies/Labs Reviewed:   ECG (independently read by me): Normal sinus rhythm at 68 bpm.  No ectopy.  QTc interval 469 ms.  No ST segment changes  February 2020 ECG (independently read by me): Minus rhythm at 73 bpm with sinus arrhythmia.  QTc interval 458 ms.  PR interval 152 ms.  No significant ST-T change.  August 2019 ECG (independently read by me): Sinus rhythm at 73 with sinus arrhythmia.  Normal intervals.  May 2019 ECG (independently read by me): Normal sinus rhythm at 66 bpm.  No ectopy.  No ST segment changes.  December 2018 ECG (independently read by me): Normal sinus rhythm at 77 bpm.  Nonspecific ST changes.  June 2018 ECG (independently read by me): Sinus bradycardia 56 bpm.  Normal intervals.  No significant ST-T changes.  March 2018 ECG (independently read by me): Sinus bradycardia 54 bpm.  Normal intervals.  No ST segment changes.   December 2017 ECG (independently read by me): Normal sinus rhythm at 74 bpm.  No ectopy.  QTc interval 455 ms.  Recent Labs: BMP Latest Ref Rng & Units 09/01/2019 03/03/2018 06/24/2017  Glucose 70 - 99 mg/dL 101(H) 137(H) 96  BUN 8 - 23 mg/dL 11 8 12   Creatinine 0.44 - 1.00 mg/dL 0.73 0.79 0.77  Sodium 135 - 145 mmol/L 140 138 140  Potassium 3.5 - 5.1 mmol/L 4.1 3.7 4.8  Chloride 98 - 111 mmol/L 106 105 106  CO2 22 - 32 mmol/L 25 24 28   Calcium 8.9 - 10.3 mg/dL 8.9 9.2 8.9     Hepatic Function Latest Ref Rng & Units 03/03/2018 06/24/2017 11/19/2016  Total Protein 6.5 - 8.1 g/dL 7.3 7.2 6.8  Albumin 3.5 - 5.0 g/dL 3.7 3.7 3.7  AST 15 - 41 U/L 18 24 19   ALT 14 - 54 U/L 11(L) 15 14  Alk Phosphatase 38 - 126 U/L 61 58 57  Total Bilirubin 0.3 - 1.2 mg/dL 0.6 0.7 0.6  Bilirubin, Direct 0.1 - 0.5 mg/dL <0.1(L) - 0.1    CBC Latest Ref Rng & Units 09/01/2019 03/03/2018 06/24/2017  WBC 4.0 - 10.5 K/uL 8.4 9.9 8.0  Hemoglobin 12.0 - 15.0 g/dL 12.9 12.9 12.4  Hematocrit 36.0 - 46.0 % 40.5 39.6 37.6  Platelets 150 - 400 K/uL 263 255 269   Lab Results  Component Value Date   MCV 95.5 09/01/2019   MCV 93.0 03/03/2018   MCV 93.3 06/24/2017   No results found  for: TSH No results found for: HGBA1C   BNP    Component Value Date/Time   BNP 51.0 09/01/2016 1012    ProBNP No results found for: PROBNP   Lipid Panel     Component Value Date/Time   CHOL 227 (H) 10/31/2016 1245   TRIG 164 (H) 10/31/2016 1245   HDL 47 (L) 10/31/2016 1245   CHOLHDL 4.8 10/31/2016 1245   VLDL 33 (H) 10/31/2016 1245   LDLCALC 147 (H) 10/31/2016 1245     RADIOLOGY: No results found.   Additional studies/ records that were reviewed today include:   At her last office visit I reviewed the office records of [name].  I reviewed her echo Doppler study and pulmonary function tests. I reviewed a  recent head CT  ASSESSMENT:    1. Coronary artery disease involving native coronary artery of native heart  without angina pectoris: Status post DES stent to RCA and left circumflex December 2017.   2. Essential hypertension   3. Hyperlipidemia with target LDL less than 70   4. Left ventricular diastolic dysfunction   5. Medication management     PLAN:  Kara Woods is an 86 year old Caucasian female who had developed progressive decline in exercise tolerance with significant increasing shortness of breath with minimal activity associated with chest pressure and left arm radiation.  In 1998, reportedly a cardiac catheterization had shown normal coronary arteries.  An echo Doppler study from November 2017 showed an EF of 50% with diffuse hypokinesis.  There was mild MR and mild dilatation of her left atrium. Her pulmonary function studies did not identified significant pulmonary disability in the etiology of her dyspnea.  With her very strong family history of myocardial infarctions in multiple family members, as well as remote tobacco history and progressive changes in symptomatology she underwent repeat cardiac catheterization on November 04, 2016.  This revealed severe multivessel CAD as noted above and she underwent successful stenting of her RCA as well as her left circumflex vessels in a staged procedure.  When I saw her, she had noticed some subsequent shortness of breath which improved with initiation of Spironolactone.  She currently is no longer taking this apparently is just on metoprolol 12.5 mg.  Her Brilinta was substituted with Plavix and P2Y12 testing indicated Plavix responsiveness.  I last saw her, she was no longer taking Zetia.  I recommended reinstitution but unfortunately her compliance has been poor and she is only been taking this perhaps 1 time per week.  Her LDL cholesterol in July was elevated at 130.  Her lab was recently checked.  We will try to obtain these results from Dr. Nevada Crane.  She apparently has been intolerant to statins.  I strongly recommended that she resume taking Zetia  10 mg on a daily basis.  She may be a candidate for bempedoic acid combination with Zetia and I will defer institution potentially until obtain results of most recent laboratory.  She is not having any exertional chest pain or exertional dyspnea.  When she feels like there is a band around her chest typically this does not last long and it usually occurs when she is sitting down.  She does wear bras but not all the time but this may be contributing to some of her discomfort.  Her blood pressure today is controlled on metoprolol 12.5 mg twice a day.  She continues to be on DAPT with aspirin/Plavix.  She has neuropathy for which she takes gabapentin.  Weight is  stable.  Medication Adjustments/Labs and Tests Ordered: Current medicines are reviewed at length with the patient today.  Concerns regarding medicines are outlined above.  Medication changes, Labs and Tests ordered today are listed in the Patient Instructions below.  Patient Instructions  Medication Instructions:  TAKE YOUR ZETIA DAILY! *If you need a refill on your cardiac medications before your next appointment, please call your pharmacy*  Lab Work: IN 6 MONTHS FASTING LABS: CMET LIPID TSH CBC If you have labs (blood work) drawn today and your tests are completely normal, you will receive your results only by: Marland Kitchen MyChart Message (if you have MyChart) OR . A paper copy in the mail If you have any lab test that is abnormal or we need to change your treatment, we will call you to review the results.  Follow-Up: At [facility], you and your health needs are our priority.  As part of our continuing mission to provide you with exceptional heart care, we have created designated Provider Care Teams.  These Care Teams include your primary Cardiologist (physician) and Advanced Practice Providers (APPs -  Physician Assistants and Nurse Practitioners) who all work together to provide you with the care you need, when you need it.  Your next  appointment:   6 month(s)  The format for your next appointment:   In Person  Provider:   Shelva Majestic, MD       Signed, Shelva Majestic, MD, [facility] 01/08/2020 3:57 PM    [facility] [address], [place], [place], [place]  [address] Phone: [phone]

## 2020-01-08 ENCOUNTER — Encounter: Payer: Self-pay | Admitting: Cardiovascular Disease

## 2020-01-17 DIAGNOSIS — Z23 Encounter for immunization: Secondary | ICD-10-CM | POA: Diagnosis not present

## 2020-01-18 ENCOUNTER — Other Ambulatory Visit: Payer: Self-pay | Admitting: Cardiovascular Disease

## 2020-01-18 NOTE — Telephone Encounter (Signed)
*  STAT* If patient is at the pharmacy, call can be transferred to refill team.   1. Which medications need to be refilled? (please list name of each medication and dose if known) ezetimibe (ZETIA) 10 MG tablet  2. Which pharmacy/location (including street and city if local pharmacy) is medication to be sent to? [facility]  3. Do they need a 30 day or 90 day supply? 90 day

## 2020-01-19 MED ORDER — EZETIMIBE 10 MG PO TABS: 10.0000 mg | ORAL_TABLET | Freq: Every day | ORAL | 3 refills | Status: DC

## 2020-01-19 NOTE — Telephone Encounter (Signed)
Rx(s) sent to pharmacy electronically.  

## 2020-01-20 ENCOUNTER — Telehealth: Payer: Self-pay | Admitting: Cardiovascular Disease

## 2020-01-20 NOTE — Telephone Encounter (Signed)
Patient's daughter Hoyle Sauer is calling requesting a nurse call her to discuss if [name] has received the lab results he requested as well as some questions regarding a medication. Please advise.

## 2020-01-20 NOTE — Telephone Encounter (Signed)
Spoke with pt's daughter Hoyle Sauer (per DPR). Notified that I have not received the faxed lab results yet but would continue to look for them or call Dr.Hall's office to have them resent. Daughter also asked for Zetia refill to be sent to the pharmacy in [place]. Notified that this was completed yesterday. Daughter thankful with no other questions or concerns at this time.

## 2020-01-21 ENCOUNTER — Telehealth: Payer: Self-pay

## 2020-01-21 NOTE — Telephone Encounter (Signed)
Spoke with Brayton Layman at [place] office, she will resend the most recent labs to our fax at [phone]

## 2020-01-21 NOTE — Telephone Encounter (Signed)
Received labs from [place] office

## 2020-01-25 ENCOUNTER — Other Ambulatory Visit: Payer: Self-pay | Admitting: Cardiovascular Disease

## 2020-01-26 DIAGNOSIS — F411 Generalized anxiety disorder: Secondary | ICD-10-CM | POA: Diagnosis not present

## 2020-01-27 ENCOUNTER — Telehealth: Payer: Self-pay | Admitting: Cardiovascular Disease

## 2020-01-27 MED ORDER — EZETIMIBE 10 MG PO TABS: 10.0000 mg | ORAL_TABLET | Freq: Every day | ORAL | 3 refills | Status: DC

## 2020-01-27 NOTE — Telephone Encounter (Signed)
Called daughter and advised that medication was sent to pharmacy

## 2020-01-27 NOTE — Telephone Encounter (Signed)
New message   Patient's daughter states that she needs a new prescription for ezetimibe (ZETIA) 10 MG tablet sent to [id number] - [place], [place] - [address]. 90 day supply

## 2020-02-08 DIAGNOSIS — F411 Generalized anxiety disorder: Secondary | ICD-10-CM | POA: Diagnosis not present

## 2020-02-15 DIAGNOSIS — J069 Acute upper respiratory infection, unspecified: Secondary | ICD-10-CM | POA: Diagnosis not present

## 2020-02-18 DIAGNOSIS — H5010 Unspecified exotropia: Secondary | ICD-10-CM | POA: Diagnosis not present

## 2020-02-21 ENCOUNTER — Other Ambulatory Visit: Payer: Self-pay

## 2020-02-21 ENCOUNTER — Encounter (HOSPITAL_COMMUNITY): Payer: Self-pay

## 2020-02-21 ENCOUNTER — Other Ambulatory Visit (HOSPITAL_COMMUNITY): Payer: Self-pay | Admitting: Internal Medicine

## 2020-02-21 ENCOUNTER — Ambulatory Visit (HOSPITAL_COMMUNITY)
Admission: RE | Admit: 2020-02-21 | Discharge: 2020-02-21 | Disposition: A | Discharge status: Home / Self Care | Payer: Medicare Other | Source: Ambulatory Visit | Attending: Internal Medicine | Admitting: Internal Medicine

## 2020-02-21 DIAGNOSIS — R059 Cough, unspecified: Secondary | ICD-10-CM

## 2020-02-21 DIAGNOSIS — R05 Cough: Secondary | ICD-10-CM

## 2020-02-21 DIAGNOSIS — M19012 Primary osteoarthritis, left shoulder: Secondary | ICD-10-CM | POA: Diagnosis not present

## 2020-02-21 DIAGNOSIS — M5414 Radiculopathy, thoracic region: Secondary | ICD-10-CM | POA: Insufficient documentation

## 2020-02-21 DIAGNOSIS — M19011 Primary osteoarthritis, right shoulder: Secondary | ICD-10-CM | POA: Diagnosis not present

## 2020-02-21 IMAGING — DX DG SHOULDER 2+V*R*
3 series · 3 of 3 positions shown · non-contrast
Comparison: None.

CLINICAL DATA: Pain

EXAM:
RIGHT SHOULDER - 2+ VIEW

[shoulder grashey]
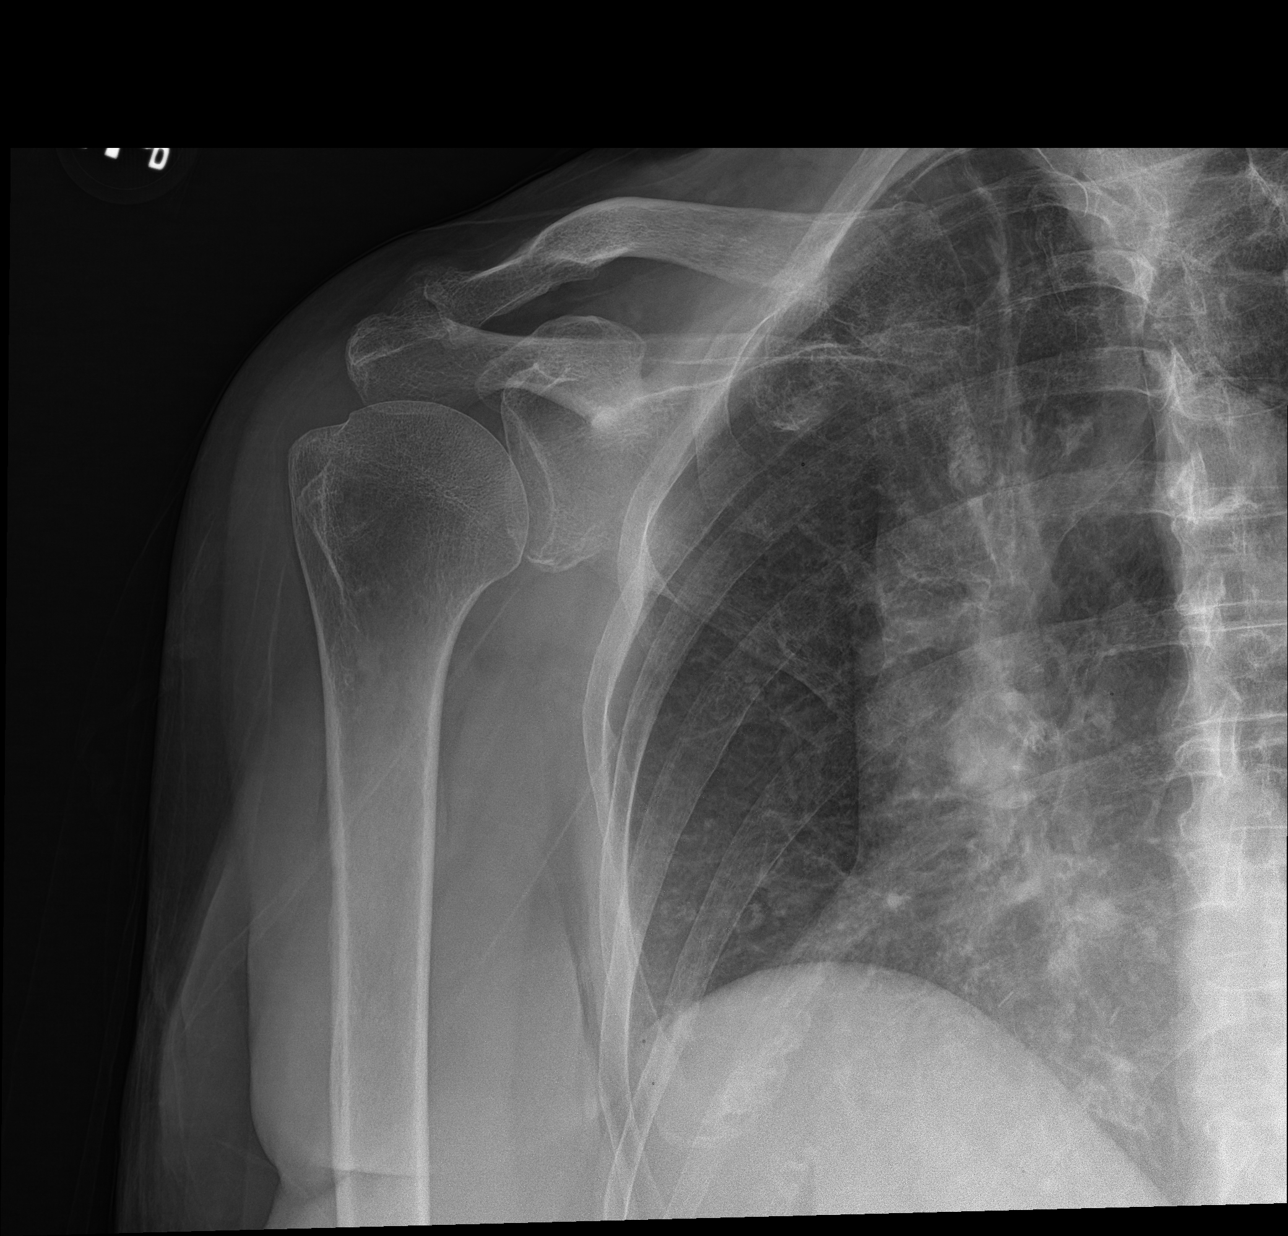

[shoulder axillary]
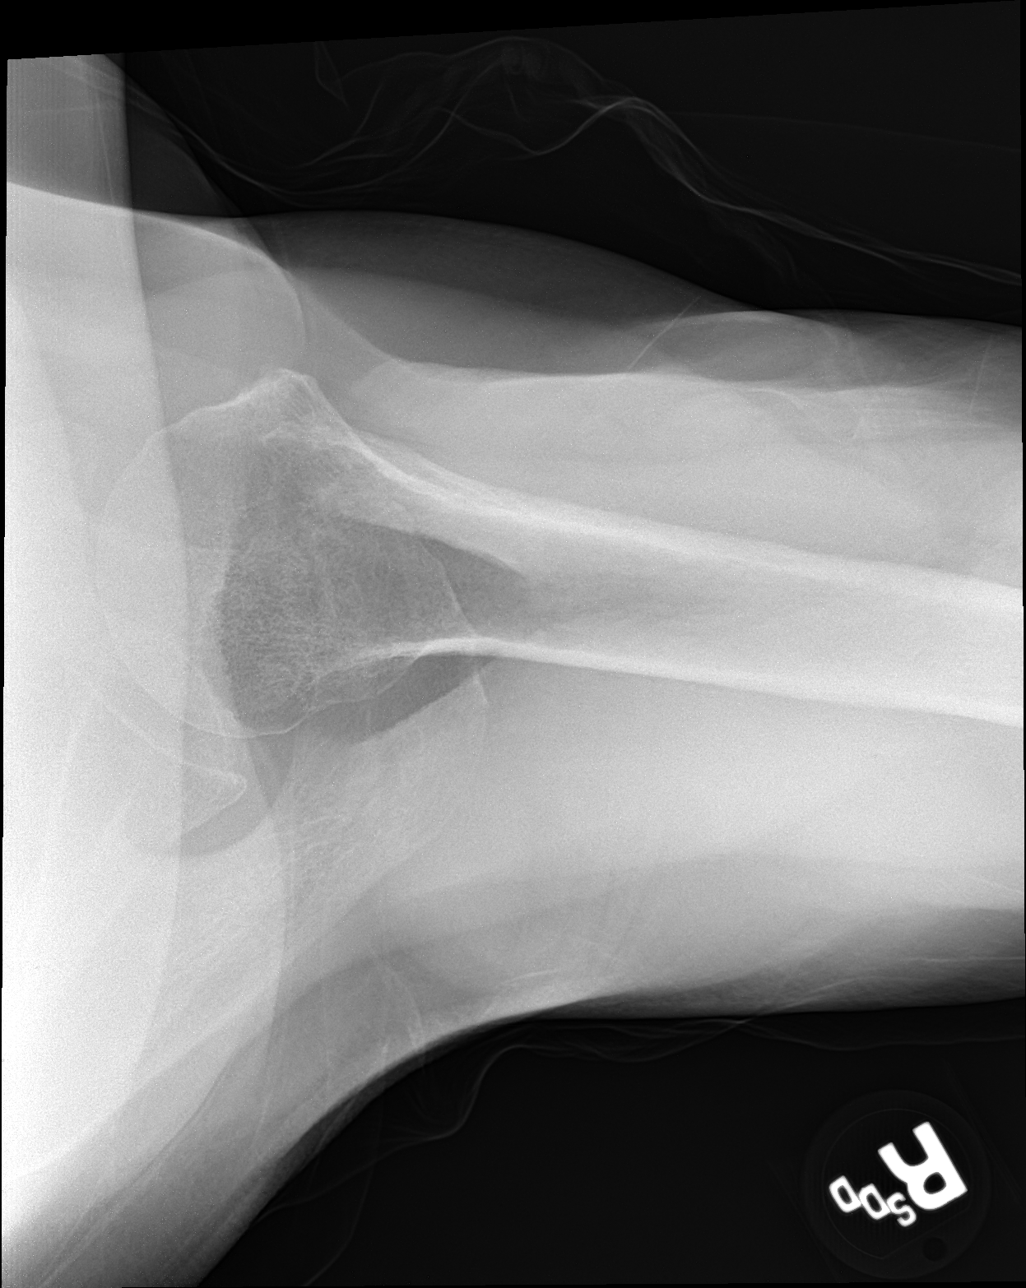

[shoulder y view]
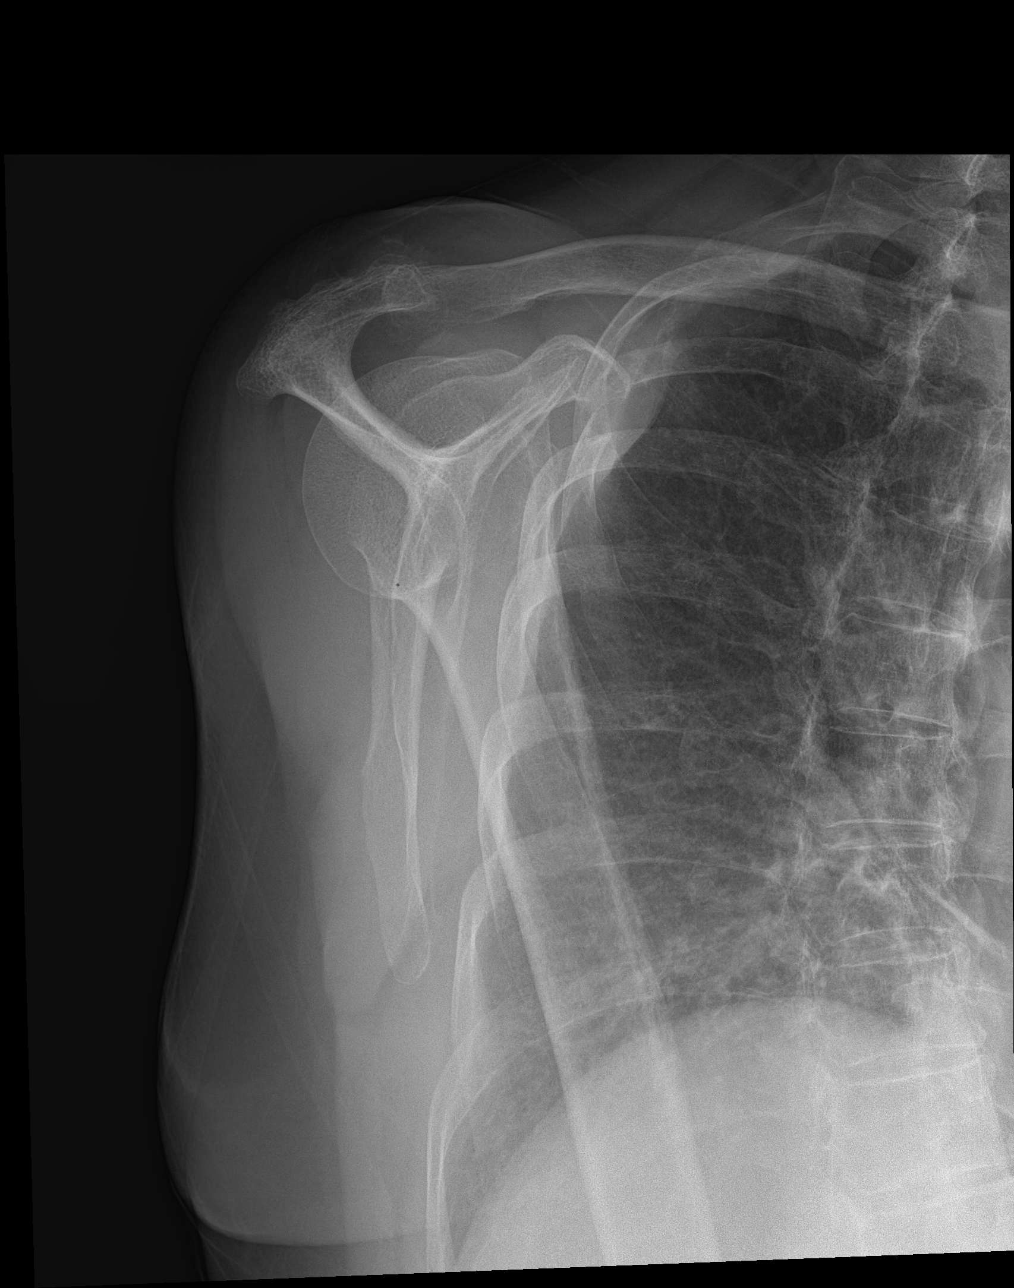

[3 of 3 positions shown; findings below may reference images not displayed]

FINDINGS: Oblique, Y scapular, and axillary images were obtained. There is
moderate osteoarthritic change in the acromioclavicular joint. The
glenohumeral joint appears within normal limits. No fracture or
dislocation. No erosive change or intra-articular calcification.
Visualized right lung clear.
IMPRESSION: Moderate osteoarthritic change in the acromioclavicular joint. No
fracture or dislocation.

## 2020-02-21 IMAGING — DX DG CHEST 2V
2 series · 2 of 2 positions shown · non-contrast
Comparison: [DATE]

CLINICAL DATA: Cough

EXAM:
CHEST - 2 VIEW

[chest pa]
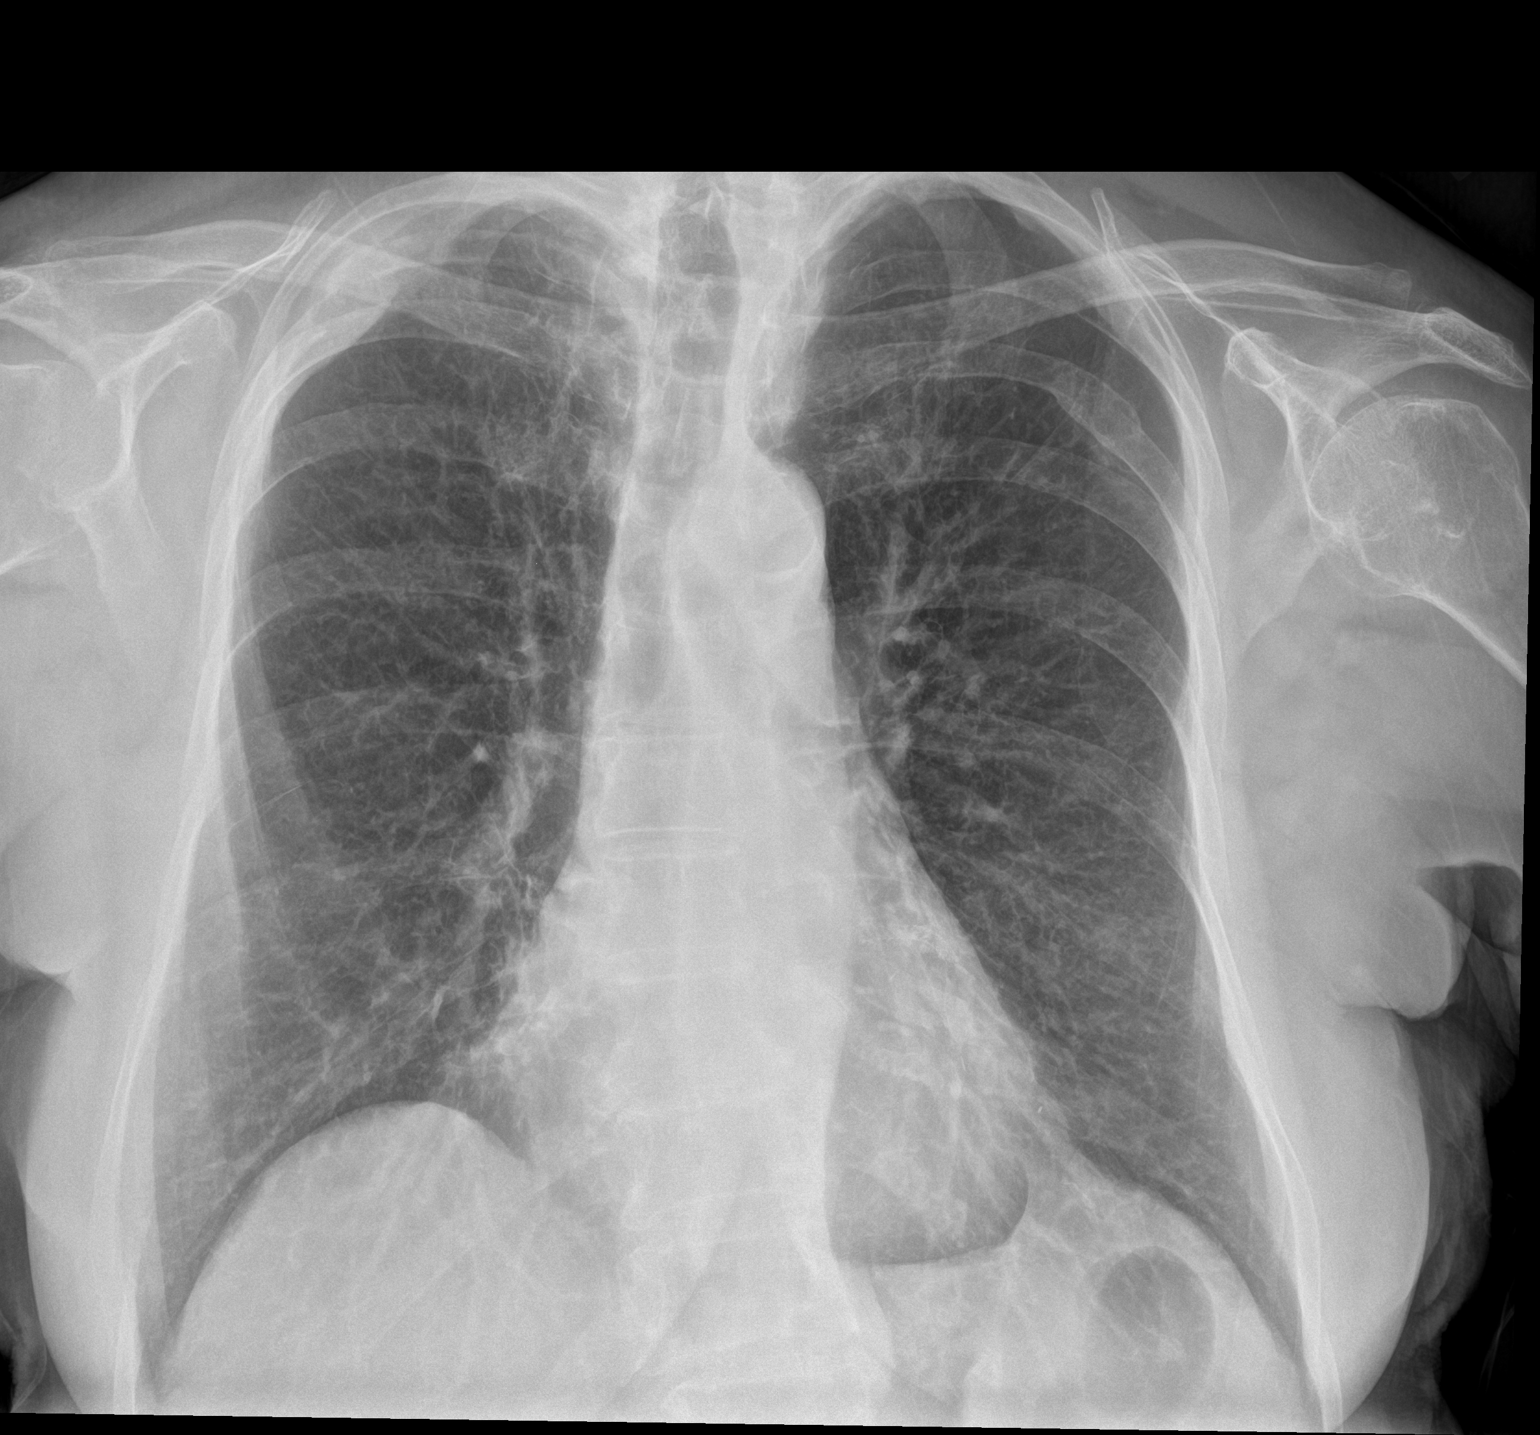

[chest lat]
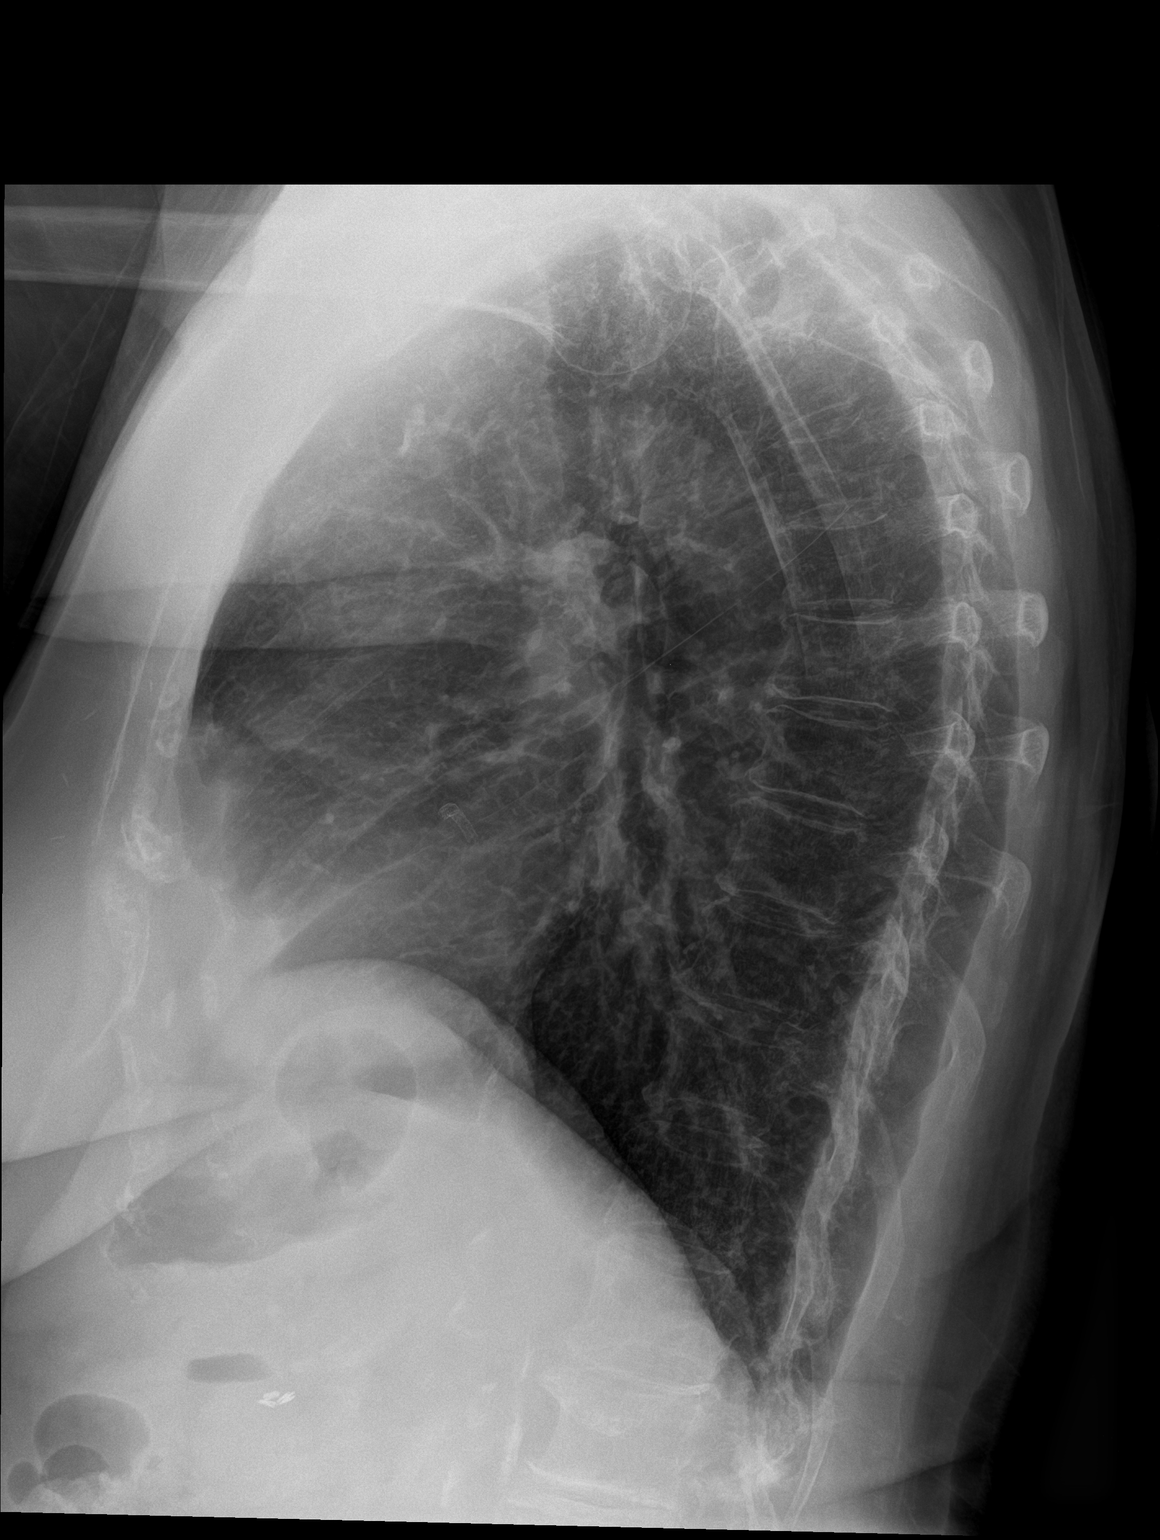

[2 of 2 positions shown; findings below may reference images not displayed]

FINDINGS: Probable emphysema with mild chronic interstitial prominence. No new
consolidation or edema. No pleural effusion. Stable
cardiomediastinal contours with normal heart size. No acute osseous
abnormality. Aortic atherosclerosis.
IMPRESSION: No acute process in the chest.

## 2020-02-21 IMAGING — DX DG SHOULDER 2+V*L*
3 series · 3 of 3 positions shown · non-contrast
Comparison: None.

CLINICAL DATA: Pain

EXAM:
LEFT SHOULDER - 2+ VIEW

[shoulder grashey]
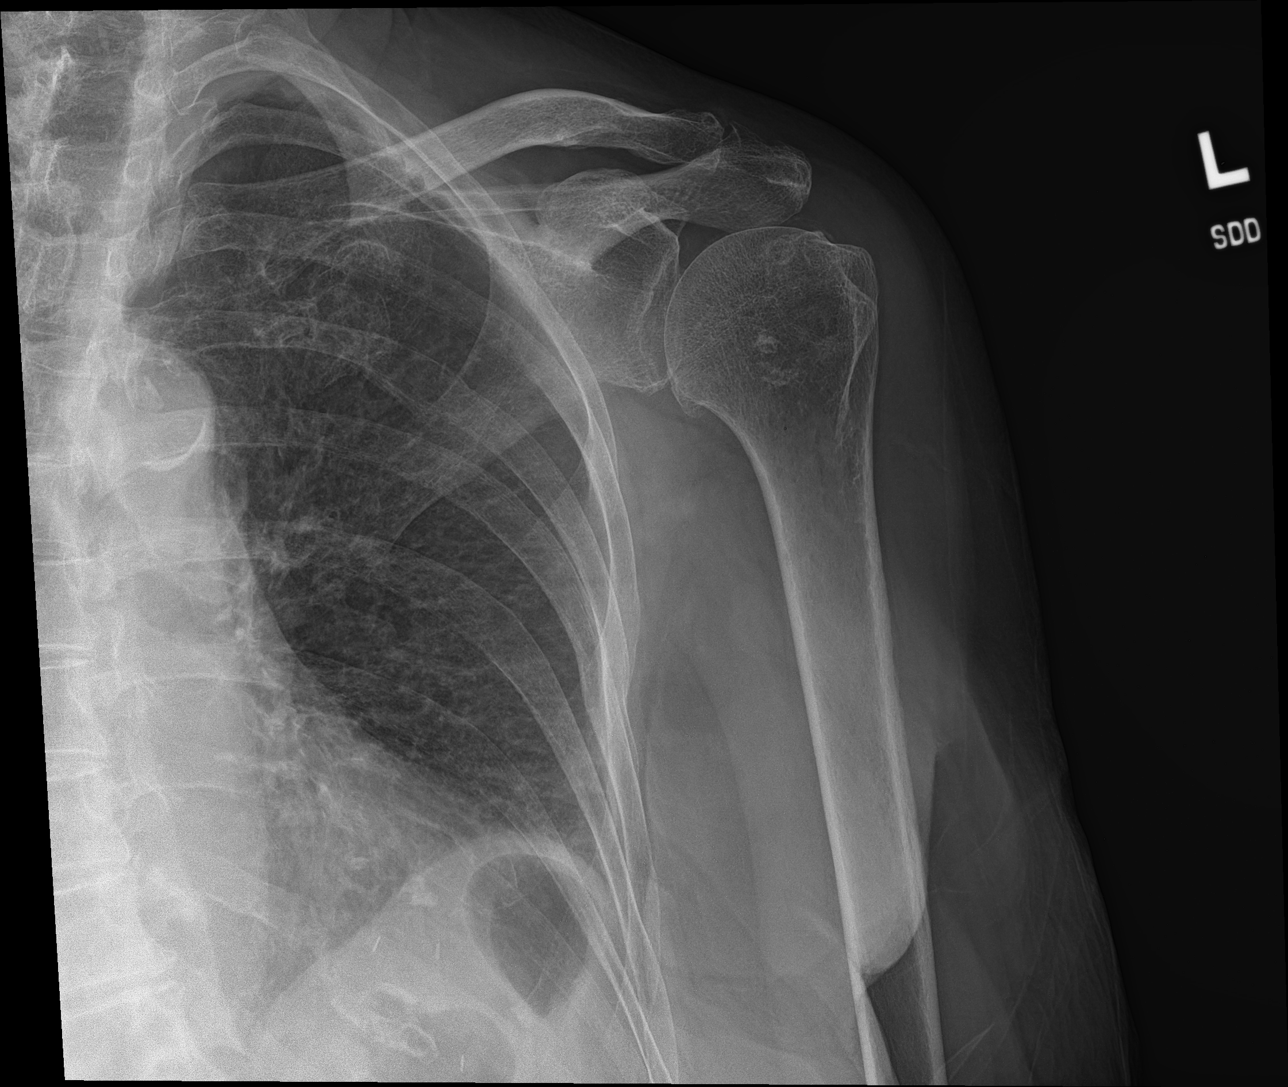

[shoulder y view]
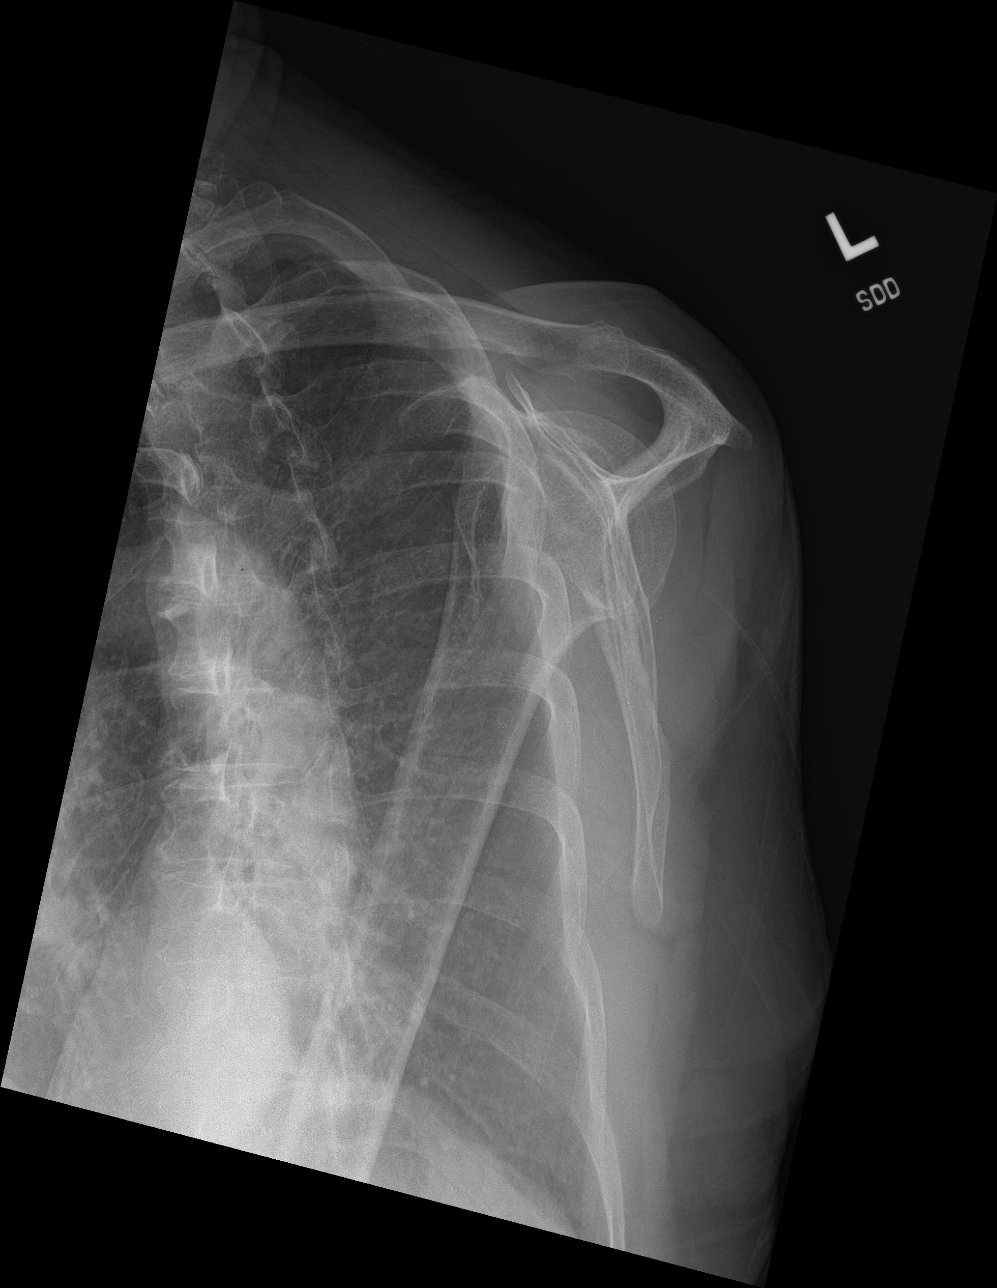

[shoulder axillary]
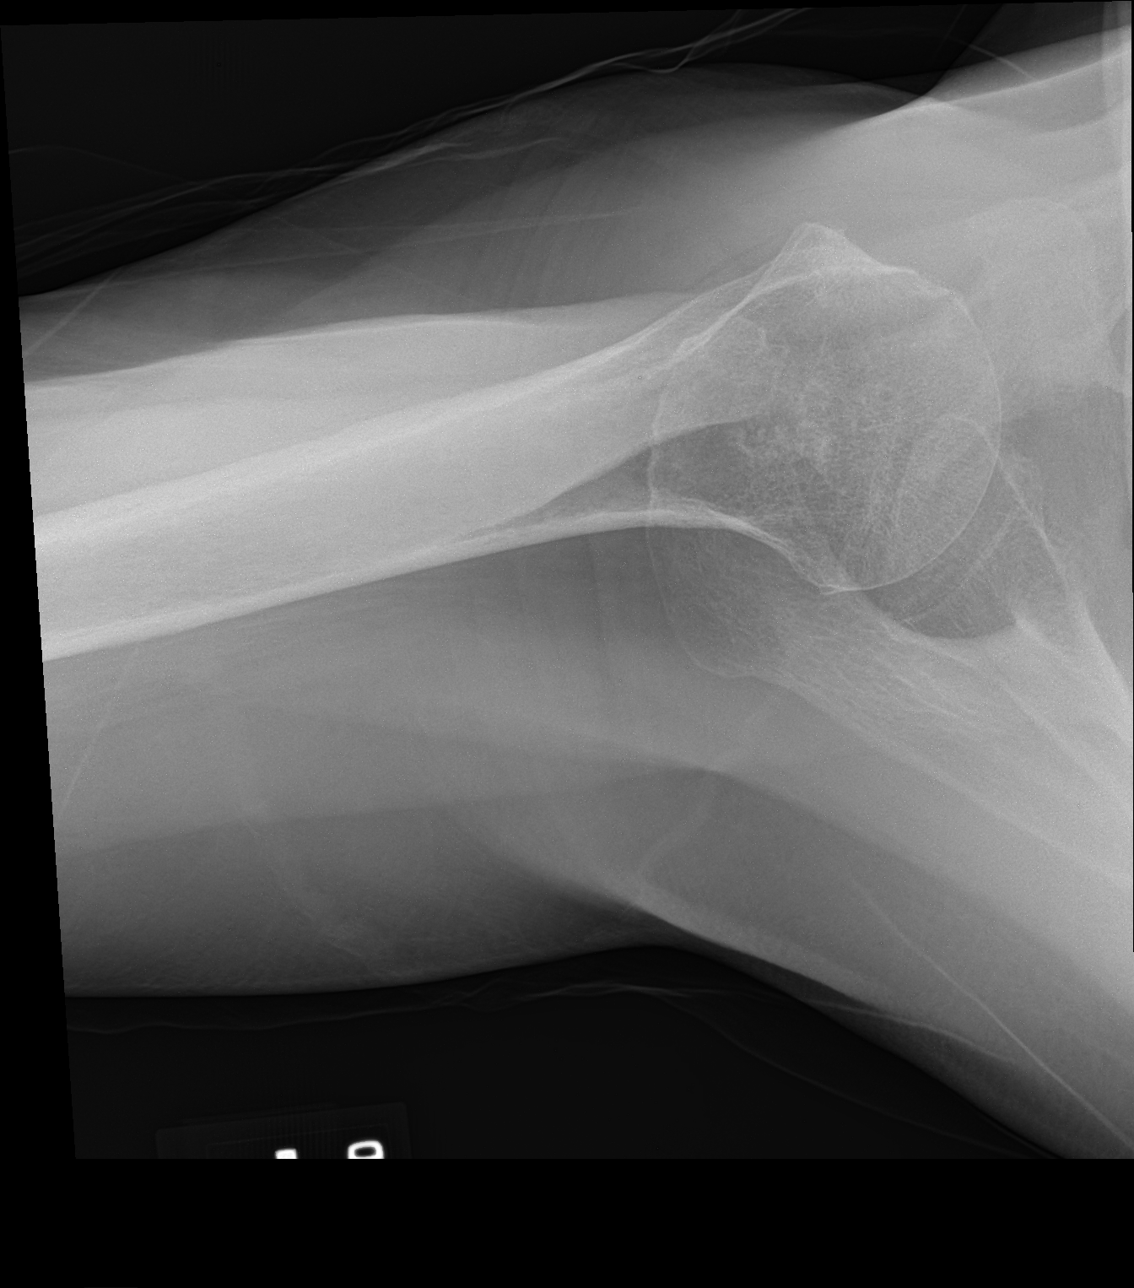

[3 of 3 positions shown; findings below may reference images not displayed]

FINDINGS: Oblique, Y scapular, and axillary images were obtained. There is no
fracture or dislocation. There is moderate narrowing of the
acromioclavicular and glenohumeral joints. No erosive change or
intra-articular calcification.

There old healed rib fractures on the left. There is aortic
atherosclerosis. Visualized left lung clear.
IMPRESSION: Moderate generalized osteoarthritic change. No acute fracture or
dislocation. Old healed rib fractures on the left.

Aortic Atherosclerosis ([WB]-[WB]).

## 2020-03-01 ENCOUNTER — Telehealth: Payer: Self-pay | Admitting: Cardiovascular Disease

## 2020-03-01 DIAGNOSIS — R61 Generalized hyperhidrosis: Secondary | ICD-10-CM | POA: Diagnosis not present

## 2020-03-01 DIAGNOSIS — E782 Mixed hyperlipidemia: Secondary | ICD-10-CM | POA: Diagnosis not present

## 2020-03-01 DIAGNOSIS — F411 Generalized anxiety disorder: Secondary | ICD-10-CM | POA: Diagnosis not present

## 2020-03-01 DIAGNOSIS — M546 Pain in thoracic spine: Secondary | ICD-10-CM | POA: Diagnosis not present

## 2020-03-01 NOTE — Telephone Encounter (Signed)
Patient hasn't been feeling well, her PCP did x-rays and it all can back fine.  She has been waking up in a sweat in the middle of the night. They are on their way now back to the PCP office, daughter doesn't know what to do, that is why she called here. She wants to know if she should take her mother to the ER. Mother has no SOB, BP is fine, she not dizzy, she was having CP that was why did they chest x-ray, that came back normal.

## 2020-03-01 NOTE — Telephone Encounter (Signed)
I spoke with patient's daughter. She reports patient has not been feeling well. Was seen by PCP a couple of weeks ago and started on Zpack and prednisone. 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 Chest X-ray was done by PCP due to chest pain. Daughter reports patient's chest pain is like what she had in the past. No recent change.  Daughter is concerned about sweating. Sweating does not happen when patient has pain and only occurs at night. Patient was recently taken off tranxene due to cost.  They are currently at PCP's office.  I told them to call us back after visit today if PCP felt visit with cardiology was indicated.

## 2020-03-05 NOTE — Telephone Encounter (Signed)
Agree with recommendations.  

## 2020-03-06 NOTE — Telephone Encounter (Signed)
noted 

## 2020-03-07 DIAGNOSIS — R61 Generalized hyperhidrosis: Secondary | ICD-10-CM | POA: Diagnosis not present

## 2020-03-07 DIAGNOSIS — F411 Generalized anxiety disorder: Secondary | ICD-10-CM | POA: Diagnosis not present

## 2020-03-07 DIAGNOSIS — E782 Mixed hyperlipidemia: Secondary | ICD-10-CM | POA: Diagnosis not present

## 2020-03-07 DIAGNOSIS — M546 Pain in thoracic spine: Secondary | ICD-10-CM | POA: Diagnosis not present

## 2020-03-31 DIAGNOSIS — H18511 Endothelial corneal dystrophy, right eye: Secondary | ICD-10-CM | POA: Diagnosis not present

## 2020-03-31 DIAGNOSIS — H1820 Unspecified corneal edema: Secondary | ICD-10-CM | POA: Diagnosis not present

## 2020-03-31 DIAGNOSIS — H401131 Primary open-angle glaucoma, bilateral, mild stage: Secondary | ICD-10-CM | POA: Diagnosis not present

## 2020-03-31 DIAGNOSIS — H5010 Unspecified exotropia: Secondary | ICD-10-CM | POA: Diagnosis not present

## 2020-06-17 DIAGNOSIS — M1389 Other specified arthritis, multiple sites: Secondary | ICD-10-CM | POA: Diagnosis not present

## 2020-06-17 DIAGNOSIS — M7122 Synovial cyst of popliteal space [Baker], left knee: Secondary | ICD-10-CM | POA: Diagnosis not present

## 2020-06-26 ENCOUNTER — Other Ambulatory Visit (HOSPITAL_COMMUNITY): Payer: Self-pay | Admitting: Internal Medicine

## 2020-06-26 DIAGNOSIS — N390 Urinary tract infection, site not specified: Secondary | ICD-10-CM | POA: Diagnosis not present

## 2020-06-26 DIAGNOSIS — K219 Gastro-esophageal reflux disease without esophagitis: Secondary | ICD-10-CM | POA: Diagnosis not present

## 2020-06-26 DIAGNOSIS — N644 Mastodynia: Secondary | ICD-10-CM | POA: Diagnosis not present

## 2020-06-26 DIAGNOSIS — J309 Allergic rhinitis, unspecified: Secondary | ICD-10-CM | POA: Diagnosis not present

## 2020-06-26 DIAGNOSIS — Z6827 Body mass index (BMI) 27.0-27.9, adult: Secondary | ICD-10-CM | POA: Diagnosis not present

## 2020-06-26 DIAGNOSIS — R071 Chest pain on breathing: Secondary | ICD-10-CM | POA: Diagnosis not present

## 2020-06-26 DIAGNOSIS — R61 Generalized hyperhidrosis: Secondary | ICD-10-CM | POA: Diagnosis not present

## 2020-06-26 DIAGNOSIS — M7918 Myalgia, other site: Secondary | ICD-10-CM | POA: Diagnosis not present

## 2020-06-26 DIAGNOSIS — J060 Acute laryngopharyngitis: Secondary | ICD-10-CM | POA: Diagnosis not present

## 2020-06-26 DIAGNOSIS — I10 Essential (primary) hypertension: Secondary | ICD-10-CM | POA: Diagnosis not present

## 2020-06-26 DIAGNOSIS — F5089 Other specified eating disorder: Secondary | ICD-10-CM | POA: Diagnosis not present

## 2020-06-26 DIAGNOSIS — J029 Acute pharyngitis, unspecified: Secondary | ICD-10-CM | POA: Diagnosis not present

## 2020-06-26 DIAGNOSIS — R05 Cough: Secondary | ICD-10-CM | POA: Diagnosis not present

## 2020-06-26 DIAGNOSIS — E663 Overweight: Secondary | ICD-10-CM | POA: Diagnosis not present

## 2020-06-26 DIAGNOSIS — Z6826 Body mass index (BMI) 26.0-26.9, adult: Secondary | ICD-10-CM | POA: Diagnosis not present

## 2020-06-30 ENCOUNTER — Telehealth: Payer: Self-pay | Admitting: Cardiovascular Disease

## 2020-06-30 NOTE — Telephone Encounter (Signed)
Called and spoke with pt, she states the chest pain she has is mid chest and on the left side under her breast and on the outside. She denies any active chest pain at the moment. She states that it feels like there is a rope under her breast and it feels like she is being pulled together and it is tight.  Notified that with these symptoms we recommend going to the emergency department to be evaluated. Pt states she is not going to the emergency department. She reports the last time she was there for 8 hours and she doesn't want to go due to all of the germs from the corona virus.  Able to schedule pt with Dr.Kelly on 07/04/20 at 10:20am. Advised that if the pain got worse to go to the ED pt verbalized that she would. No other questions at this time.

## 2020-06-30 NOTE — Telephone Encounter (Signed)
   Pt c/o of Chest Pain: STAT if CP now or developed within 24 hours  1. Are you having CP right now? No  2. Are you experiencing any other symptoms (ex. SOB, nausea, vomiting, sweating)? SOB  3. How long have you been experiencing CP? Couple weeks   4. Is your CP continuous or coming and going? Coming and going   5. Have you taken Nitroglycerin? No ?  Pt said she's been experiencing chest pain been coming and going for the last couple of weeks, she said she feels it on her left side where the breast area. She said she takes Aspirin and put heating pad for relief.

## 2020-07-04 ENCOUNTER — Ambulatory Visit (INDEPENDENT_AMBULATORY_CARE_PROVIDER_SITE_OTHER): Payer: Medicare Other | Admitting: Cardiovascular Disease

## 2020-07-04 ENCOUNTER — Encounter: Payer: Self-pay | Admitting: Cardiovascular Disease

## 2020-07-04 ENCOUNTER — Other Ambulatory Visit (HOSPITAL_COMMUNITY): Payer: Self-pay | Admitting: Internal Medicine

## 2020-07-04 ENCOUNTER — Other Ambulatory Visit: Payer: Self-pay

## 2020-07-04 VITALS — BP 140/88 | HR 82 | Ht 66.0 in | Wt 166.0 lb

## 2020-07-04 DIAGNOSIS — M940 Chondrocostal junction syndrome [Tietze]: Secondary | ICD-10-CM | POA: Diagnosis not present

## 2020-07-04 DIAGNOSIS — I2510 Atherosclerotic heart disease of native coronary artery without angina pectoris: Secondary | ICD-10-CM

## 2020-07-04 DIAGNOSIS — E785 Hyperlipidemia, unspecified: Secondary | ICD-10-CM | POA: Diagnosis not present

## 2020-07-04 DIAGNOSIS — I10 Essential (primary) hypertension: Secondary | ICD-10-CM

## 2020-07-04 DIAGNOSIS — I440 Atrioventricular block, first degree: Secondary | ICD-10-CM

## 2020-07-04 DIAGNOSIS — N644 Mastodynia: Secondary | ICD-10-CM

## 2020-07-04 NOTE — Progress Notes (Signed)
Cardiology Office Note    Date:  07/04/2020   ID:  Nesiah, Jump [birth year], MRN [id number]  PCP:  Celene Squibb, MD  Referring M.D.: Dr. Velvet Bathe Cardiologist:  Shelva Majestic, MD    History of Present Illness:  Kara Woods is a 86 y.o. female who presents for a 6 month follow-up cardiology evaluation   Kara Woods is a mother of my patient, who had suffered an out of hospital cardiac arrest years ago.  Kara Woods has a history of prior cardiac catheterization and from records [name] had undergone cardiac catheterization in 1998.  The patient states her coronaries were clean.  I do not have specifics of her catheterization findings.  Over the past 6 months, she has developed progressive decline in ability to be active and has noticed significant increasing exertionally precipitated shortness of breath.  She also has had some episodes of associated chest pressure with left arm radiation.  She had undergone an echo Doppler study on 10/24/2016 which showed an EF of 50% and suggested diffuse hypokinesis.  There was grade 1 diastolic dysfunction.  There was mild MR and mild LA dilatation.  She also had undergone pulmonary function testing was not significantly abnormal.  Her past history is notable for left breast CA and she is status post prior lumpectomy and radiation treatment.  She was cared for by Dr. Excell Seltzer.  She also is status post hysterectomy a remote history of cholecystectomy.  She has a history of retinal detachment.  When I saw her, due to progressive symptomatology with decline in exercise tolerance and increasing exertional dyspnea with a very strong family history of CAD I recommended definitive cardiac catheterization.  This was performed by me on 11/04/2016 and revealed low normal global LV function with an EF of 50-55% with mild mid inferior focal hypocontractility.  There was multivessel CAD with multiple stenoses of 40-50% mid to distal LAD.  She had a normal  ramus intermediate vessel.  There was 80% focal stenosis in the left circumflex and 95% eccentric mid RCA stenosis followed by 50% narrowing.  She underwent initial PCI to the mid RCA with insertion of a 2.2512 mm Resolute Onyx stent postdilated to 2.5 mm with the 95% stenoses being reduced to 0%.  The following day, on 11/05/2016 she underwent staged intervention to the left circumflex flex artery with the 50 and 85% stenoses being reduced to 0% with insertion of a 2.7523 mm  Xience Alpine DES stent postdilated to 3.0.  Since her intervention, she has felt significantly improved.  She had mild dyspnea more attributed to air hunger and this typically is relieved when she takes a deep sigh.   She was started on Lipitor but did not tolerate this.  Subsequently, I recommended initiation of Crestor at 5 mg 2 times a week.  She took 2 doses and then felt she had similar symptoms and stopped taking this.  She denies any exertional chest tightness.  She was diagnosed with a left knee Baker's cyst.  She denies palpitations.    She underwent a one-year follow-up nuclear stress test on 10/09/2017 which remained low risk and showed an EF of 51% without scar or ischemia.    She was  evaluated at [facility] emergency room when she was brought there by family members with complaints of increasing shortness of breath andvague nonexertional chest tightness.  There was concern of whether or not Brilinta could responsible.  Laboratory was negative.  She was not  found to have evidence for pneumonia or CHF and had a negative d-dimer.  She denies any episodes of chest pain since her evaluation.  She continues to experience exertional dyspnea.  He has been on aspirin and Brilinta for antiplatelet therapy, Zetia 10 mg for hyperlipidemia although when previously I suggested a trial of Livalo which she never pursued.    An echo Doppler study on Apr 03, 2018  showed an EF of 50% with grade 1 diastolic dysfunction.  There was aortic  sclerosis without stenosis, mild MR, and probable lipomatous hypertrophy of her atrial septum.  Although I again at that time had recommended institution of lipid-lowering therapy added to her Zetia with Crestor 5 mg weekly she never pursued this.  Her LV diastolic dysfunction and some shortness of breath I added spironolactone 12.5 mg.  The past several months she has felt improved.  She denies chest pain PND orthopnea.  She never started the rosuvastatin but was uncertain why. She denies bleeding.   I saw her in August 2019; she was doing  well and has been without recurrent chest pain she denies palpitations.  Apparently for some reason she had discontinued Zetia but had tolerated this well.  She has not been able to tolerate any statins.  We had even tried to initiate Livalo but she never started this.  She believes she is sleeping well but she wakes up at least 3 times per night to night.  She was unaware of snoring or gasping for breath.  During that evaluation I recommended that she resume Zetia 10 mg daily.  She will stop follow-up laboratory with Dr. Nevada Crane.  I last saw her in February 2021. Laboratory in July 2020 showed an LDL cholesterol of 130.  Apparently she was only taking Zetia perhaps 1 time per week she just recently underwent laboratory by Dr. Nevada Crane in January 2021.  At that time, total cholesterol was 198, triglycerides 164 and HDL 44 but I do not yet know her LDL cholesterol.  We will try to obtain these results from Dr. Nevada Crane.  She does note occasional swelling in her hands.  She denied myalgias.  She denied leg swelling.  At times she feels like there is a belt around her chest.  She denied any exertional symptoms.  If she could not tolerate Zetia I discussed the possibility of bempedoic acid.  Since her last evaluation, she states that she fell 1 month ago.  As result she hurts all over.  She does note chest wall discomfort anteriorly, nonexertional.  She continues to be on DAPT with  aspirin/Plavix.  She is on Zetia now 10 mg daily.  She also is on metoprolol tartrate 12.5 mg twice a day.  Past Medical History:  Diagnosis Date  . Anxiety   . Arthritis   . Blind left eye   . Breast cancer ([place])    left   . Colitis   . Coronary artery disease    a. 10/2016 Staged PCI: RCA 50p, 51m(2.25x12 Resolute Onyx DES), LCX 50p, 816m2.75x23 Xience Alpine DES). Residual LAD 50p/m, 4578m  . CMarland Kitchenstocele 10/11/2013  . Depression   . Diastolic dysfunction    a. 09/2016 Echo: EF 50%, diffuse hypokinesis, mild LVH, grade 1 diastolic dysfunction, mild mitral regurgitation, mildly dilated left atrium.  . GMarland KitchenRD (gastroesophageal reflux disease)    occasional Tums only  . HOH (hard of hearing)   . Pelvic relaxation 10/11/2013  . Proctitis    colonsocopy 2007, Canasa suppositories  .  S/P endoscopy March 2009   mild erosive reflux esophagitis, Schatzki's ring, s/p dilation  . Schatzki's ring   . Shortness of breath    occ if anxiety  . Wears dentures    upper  . Wears glasses     Past Surgical History:  Procedure Laterality Date  . ANTERIOR AND POSTERIOR REPAIR N/A 05/17/2014   Procedure: ANTERIOR (CYSTOCELE) AND POSTERIOR REPAIR (RECTOCELE);  Surgeon: Reece Packer, MD;  Location: [facility];  Service: Urology;  Laterality: N/A;  . BREAST LUMPECTOMY WITH NEEDLE LOCALIZATION AND AXILLARY SENTINEL LYMPH NODE BX Left 05/03/2013   Procedure: BREAST LUMPECTOMY WITH NEEDLE LOCALIZATION AND AXILLARY SENTINEL LYMPH NODE BX;  Surgeon: Edward Jolly, MD;  Location: [facility];  Service: General;  Laterality: Left;  . BREAST SURGERY Left 05/19/13  . CARDIAC CATHETERIZATION  1998  . CARDIAC CATHETERIZATION N/A 11/04/2016   Procedure: Left Heart Cath and Coronary Angiography;  Surgeon: Troy Sine, MD;  Location: [facility];  Service: Cardiovascular;  Laterality: N/A;  . CARDIAC CATHETERIZATION N/A 11/05/2016   Procedure: Coronary Stent Intervention;  Surgeon:  Troy Sine, MD;  Location: [facility];  Service: Cardiovascular;  Laterality: N/A;  . COLONOSCOPY  05/06/2006   Diffuse inflammatory changes of the rectal mucosa, consistent  with proctitis.  Otherwise, normal colon to terminal ileum  . CORONARY ANGIOPLASTY  11/04/2016  . CORONARY STENT PLACEMENT     Drug-eluting coronary artery stent, non-bioabsorbable-polymer-coated  . CYSTOSCOPY N/A 05/17/2014   Procedure: CYSTOSCOPY;  Surgeon: Reece Packer, MD;  Location: [facility];  Service: Urology;  Laterality: N/A;  . ESOPHAGOGASTRODUODENOSCOPY  02/09/2008   Schatzki ring status post dilation/Distal esophageal erosion consistent with mild erosive reflux  esophagitis, otherwise unremarkable esophagus, normal stomach, D1, D2.  . EYE SURGERY     lt cataract-implant  . EYE SURGERY     lt-lens repaced,l  . [place] SURGERY  1993  . RE-EXCISION OF BREAST LUMPECTOMY Left 05/19/2013   Procedure: RE-EXCISION OF BREAST LUMPECTOMY;  Surgeon: Edward Jolly, MD;  Location: [facility];  Service: General;  Laterality: Left;  . RETINAL DETACHMENT SURGERY  1978   Left  . VAGINAL HYSTERECTOMY Bilateral 05/17/2014   Procedure: HYSTERECTOMY VAGINAL with Bilateral Salpingo-Oophorectomy; Bladder cystotomy repair;  Surgeon: Marvene Staff, MD;  Location: [facility];  Service: Gynecology;  Laterality: Bilateral;  . VAGINAL PROLAPSE REPAIR N/A 05/17/2014   Procedure: VAGINAL VAULT PROLAPSE AND GRAFT;  Surgeon: Reece Packer, MD;  Location: [facility];  Service: Urology;  Laterality: N/A;    Current Medications: Outpatient Medications Prior to Visit  Medication Sig Dispense Refill  . acetaminophen (TYLENOL) 500 MG tablet Take 500-1,000 mg by mouth every 6 (six) hours as needed for mild pain.    Marland Kitchen aspirin EC 81 MG tablet Take 1 tablet (81 mg total) by mouth daily. 90 tablet 3  . carboxymethylcellulose (REFRESH PLUS) 0.5 % SOLN Place 3-4 drops into both eyes 3 (three) times daily as  needed (dry eyes). Preservative free    . clopidogrel (PLAVIX) 75 MG tablet     . ezetimibe (ZETIA) 10 MG tablet Take 1 tablet (10 mg total) by mouth daily. 90 tablet 3  . HYDROcodone-acetaminophen (NORCO) 5-325 MG tablet Take 1 tablet by mouth every 6 (six) hours as needed for severe pain. 10 tablet 0  . metoprolol tartrate (LOPRESSOR) 25 MG tablet TAKE 1/2 TABLET BY MOUTH TWICE A DAY 90 tablet 3  . nitroGLYCERIN (  NITROSTAT) 0.4 MG SL tablet Place 1 tablet (0.4 mg total) under the tongue every 5 (five) minutes as needed. 25 tablet 3  . Travoprost, BAK Free, (TRAVATAN) 0.004 % SOLN ophthalmic solution Place 1 drop into the right eye at bedtime.     . gabapentin (NEURONTIN) 100 MG capsule Take 1 capsule (100 mg total) by mouth 3 (three) times daily. (Patient not taking: Reported on 01/07/2020) 60 capsule 0   No facility-administered medications prior to visit.     Allergies:   Contrast media [iodinated diagnostic agents], Lipitor [atorvastatin], Sulfamethoxazole, and Sulfonamide derivatives   Social History   Socioeconomic History  . Marital status: Widowed    Spouse name: Not on file  . Number of children: Not on file  . Years of education: Not on file  . Highest education level: Not on file  Occupational History  . Occupation: Retired  Tobacco Use  . Smoking status: Former Smoker    Packs/day: 1.00    Types: Cigarettes    Quit date: 04/28/1990    Years since quitting: 30.2  . Smokeless tobacco: Never Used  . Tobacco comment: quit 19 yrs ago  Substance and Sexual Activity  . Alcohol use: Yes    Comment: occ  . Drug use: No  . Sexual activity: Not Currently    Birth control/protection: Post-menopausal  Other Topics Concern  . Not on file  Social History Narrative  . Not on file   Social Determinants of Health   Financial Resource Strain:   . Difficulty of Paying Living Expenses:   Food Insecurity:   . Worried About Charity fundraiser in the Last Year:   . Arboriculturist  in the Last Year:   Transportation Needs:   . Film/video editor (Medical):   Marland Kitchen Lack of Transportation (Non-Medical):   Physical Activity:   . Days of Exercise per Week:   . Minutes of Exercise per Session:   Stress:   . Feeling of Stress :   Social Connections:   . Frequency of Communication with Friends and Family:   . Frequency of Social Gatherings with Friends and Family:   . Attends Religious Services:   . Active Member of Clubs or Organizations:   . Attends Archivist Meetings:   Marland Kitchen Marital Status:      Family History:  The patient's family history includes Cancer in her brother; Multiple myeloma in her daughter.  I mother died and had a myocardial infarction.  Her father also had suffered a myocardial infarction.  She had 3 sisters who also had myocardial infarction and also one of her brothers also had suffered a myocardial infarction.  ROS General: Negative; No fevers, chills, or night sweats;  HEENT: History of retinal detachment, sinus congestion, difficulty swallowing Pulmonary: Negative; No cough, wheezing, shortness of breath, hemoptysis Cardiovascular: see HPI GI: Positive for occasional loose stools GU: Negative; No dysuria, hematuria, or difficulty voiding Musculoskeletal: Positive for left Baker's cyst Hematologic/Oncology: Negative; no easy bruising, bleeding Endocrine: Negative; no heat/cold intolerance; no diabetes Neuro: Negative; no changes in balance, headaches Skin: Negative; No rashes or skin lesions Psychiatric: Negative; No behavioral problems, depression Sleep: Negative; No snoring, daytime sleepiness, hypersomnolence, bruxism, restless legs, hypnogognic hallucinations, no cataplexy Other comprehensive 14 point system review is negative.   PHYSICAL EXAM:   VS:  BP 140/88   Pulse 82   Ht 5' 6"  (1.676 m)   Wt 166 lb (75.3 kg)   BMI 26.79 kg/m  blood pressure by me was 136/84  Wt Readings from Last 3 Encounters:  07/04/20 166  lb (75.3 kg)  01/07/20 169 lb 6.4 oz (76.8 kg)  09/01/19 160 lb (72.6 kg)    General: Alert, oriented, no distress.  Skin: normal turgor, no rashes, warm and dry HEENT: Normocephalic, atraumatic. Pupils equal round and reactive to light; sclera anicteric; extraocular muscles intact; Nose without nasal septal hypertrophy Mouth/Parynx benign; Mallinpatti scale 3 Neck: No JVD, no carotid bruits; normal carotid upstroke Lungs: clear to ausculatation and percussion; no wheezing or rales Chest wall: Significant tenderness over the costochondral region Heart: PMI not displaced, RRR, s1 s2 normal, 1/6 systolic murmur, no diastolic murmur, no rubs, gallops, thrills, or heaves Abdomen: soft, nontender; no hepatosplenomehaly, BS+; abdominal aorta nontender and not dilated by palpation. Back: no CVA tenderness Pulses 2+ Musculoskeletal: full range of motion, normal strength, no joint deformities Extremities: no clubbing cyanosis or edema, Homan's sign negative  Neurologic: grossly nonfocal; Cranial nerves grossly wnl Psychologic: Normal mood and affect   Studies/Labs Reviewed:   ECG (independently read by me): Normal sinus rhythm at 82 bpm. Borderline first-degree AV block with a PR level of 202 ms. QTc interval 460 ms.  February  2021 ECG (independently read by me): Normal sinus rhythm at 68 bpm.  No ectopy.  QTc interval 469 ms.  No ST segment changes  February 2020 ECG (independently read by me): Minus rhythm at 73 bpm with sinus arrhythmia.  QTc interval 458 ms.  PR interval 152 ms.  No significant ST-T change.  August 2019 ECG (independently read by me): Sinus rhythm at 73 with sinus arrhythmia.  Normal intervals.  May 2019 ECG (independently read by me): Normal sinus rhythm at 66 bpm.  No ectopy.  No ST segment changes.  December 2018 ECG (independently read by me): Normal sinus rhythm at 77 bpm.  Nonspecific ST changes.  June 2018 ECG (independently read by me): Sinus bradycardia 56  bpm.  Normal intervals.  No significant ST-T changes.  March 2018 ECG (independently read by me): Sinus bradycardia 54 bpm.  Normal intervals.  No ST segment changes.  December 2017 ECG (independently read by me): Normal sinus rhythm at 74 bpm.  No ectopy.  QTc interval 455 ms.  Recent Labs: BMP Latest Ref Rng & Units 09/01/2019 03/03/2018 06/24/2017  Glucose 70 - 99 mg/dL 101(H) 137(H) 96  BUN 8 - 23 mg/dL 11 8 12   Creatinine 0.44 - 1.00 mg/dL 0.73 0.79 0.77  Sodium 135 - 145 mmol/L 140 138 140  Potassium 3.5 - 5.1 mmol/L 4.1 3.7 4.8  Chloride 98 - 111 mmol/L 106 105 106  CO2 22 - 32 mmol/L 25 24 28   Calcium 8.9 - 10.3 mg/dL 8.9 9.2 8.9     Hepatic Function Latest Ref Rng & Units 03/03/2018 06/24/2017 11/19/2016  Total Protein 6.5 - 8.1 g/dL 7.3 7.2 6.8  Albumin 3.5 - 5.0 g/dL 3.7 3.7 3.7  AST 15 - 41 U/L 18 24 19   ALT 14 - 54 U/L 11(L) 15 14  Alk Phosphatase 38 - 126 U/L 61 58 57  Total Bilirubin 0.3 - 1.2 mg/dL 0.6 0.7 0.6  Bilirubin, Direct 0.1 - 0.5 mg/dL <0.1(L) - 0.1    CBC Latest Ref Rng & Units 09/01/2019 03/03/2018 06/24/2017  WBC 4.0 - 10.5 K/uL 8.4 9.9 8.0  Hemoglobin 12.0 - 15.0 g/dL 12.9 12.9 12.4  Hematocrit 36 - 46 % 40.5 39.6 37.6  Platelets 150 - 400 K/uL 263 255 269  Lab Results  Component Value Date   MCV 95.5 09/01/2019   MCV 93.0 03/03/2018   MCV 93.3 06/24/2017   No results found for: TSH No results found for: HGBA1C   BNP    Component Value Date/Time   BNP 51.0 09/01/2016 1012    ProBNP No results found for: PROBNP   Lipid Panel     Component Value Date/Time   CHOL 227 (H) 10/31/2016 1245   TRIG 164 (H) 10/31/2016 1245   HDL 47 (L) 10/31/2016 1245   CHOLHDL 4.8 10/31/2016 1245   VLDL 33 (H) 10/31/2016 1245   LDLCALC 147 (H) 10/31/2016 1245     RADIOLOGY: No results found.   Additional studies/ records that were reviewed today include:   At her last office visit I reviewed the office records of [name].  I reviewed her echo  Doppler study and pulmonary function tests. I reviewed a  recent head CT  ASSESSMENT:    No diagnosis found.  PLAN:  Kara Woods is an 86 year old Caucasian female who had developed progressive decline in exercise tolerance with significant increasing shortness of breath with minimal activity associated with chest pressure and left arm radiation.  In 1998, reportedly a cardiac catheterization had shown normal coronary arteries.  An echo Doppler study from November 2017 showed an EF of 50% with diffuse hypokinesis.  There was mild MR and mild dilatation of her left atrium. Her pulmonary function studies did not identified significant pulmonary disability in the etiology of her dyspnea.  With her very strong family history of myocardial infarctions in multiple family members, as well as remote tobacco history and progressive changes in symptomatology she underwent repeat cardiac catheterization on November 04, 2016.  This revealed severe multivessel CAD  and she underwent successful stenting of her RCA as well as her left circumflex vessels in a staged procedure.  When I saw her, she had noticed some subsequent shortness of breath which improved with initiation of Spironolactone.  She currently is no longer taking this apparently is just on metoprolol 12.5 mg.  Her Brilinta was substituted with Plavix and P2Y12 testing indicated Plavix responsiveness.  Presently, she has significant costochondral tenderness on exam which has been accounting for her chest pain which has been nonexertional.  She had a fall 1 month ago and essentially hurts all over.  I have recommended initial therapy with Aleve and she will take 400 mg 2 times a day for 5 days and then reduce this to 200 mg twice a day along with Pepcid 20 mg daily until her discomfort improves.  I encouraged that she stay hydrated.  Recent laboratory was reviewed and her BUN was 10 creatinine 0.83 glucose 89.  She continues to be on Zetia 10 mg and LDL  cholesterol is 125.  If she is statin intolerant I would suggest that when she sees Dr. Nevada Crane perhaps she have a trial of bempedoic acid plus Zetia for more optimal treatment.  Her blood pressure today is controlled on metoprolol 12.5 mg twice  a day and ECG shows borderline first-degree AV block.  She will follow up with Dr. Nevada Crane.  I will see her in 6 months for follow-up evaluation.   Medication Adjustments/Labs and Tests Ordered: Current medicines are reviewed at length with the patient today.  Concerns regarding medicines are outlined above.  Medication changes, Labs and Tests ordered today are listed in the Patient Instructions below.  There are no Patient Instructions on file for this visit.  Signed, Shelva Majestic, MD, [facility] 07/04/2020 10:53 AM    [facility] [address] [place], [place] [employer] [employer] [place], [place]  [address] Phone: [phone]

## 2020-07-04 NOTE — Patient Instructions (Addendum)
Medication Instructions:  BEGIN TAKING ALEVE (200MG ) OTC- TAKE 2 PILLS (400MG ) TWICE A DAY FOR 5 DAYS. THEN DECREASE TO 1 PILL (200MG ) TWICE A DAY AS NEEDED  BEGIN TAKING PEPCID OTC WHILE TAKING THE ALEVE  *If you need a refill on your cardiac medications before your next appointment, please call your pharmacy*    Follow-Up: At [facility], you and your health needs are our priority.  As part of our continuing mission to provide you with exceptional heart care, we have created designated Provider Care Teams.  These Care Teams include your primary Cardiologist (physician) and Advanced Practice Providers (APPs -  Physician Assistants and Nurse Practitioners) who all work together to provide you with the care you need, when you need it.  We recommend signing up for the patient portal called "MyChart".  Sign up information is provided on this After Visit Summary.  MyChart is used to connect with patients for Virtual Visits (Telemedicine).  Patients are able to view lab/test results, encounter notes, upcoming appointments, etc.  Non-urgent messages can be sent to your provider as well.   To learn more about what you can do with MyChart, go to [email or web address].    Your next appointment:   6 month(s)  The format for your next appointment:   In Person  Provider:   Shelva Majestic, MD    6 months for follow-up evaluation

## 2020-07-07 ENCOUNTER — Encounter: Payer: Self-pay | Admitting: Cardiovascular Disease

## 2020-07-13 ENCOUNTER — Encounter (HOSPITAL_COMMUNITY): Payer: Self-pay

## 2020-07-13 ENCOUNTER — Ambulatory Visit (HOSPITAL_COMMUNITY)
Admission: RE | Admit: 2020-07-13 | Discharge: 2020-07-13 | Disposition: A | Discharge status: Home / Self Care | Payer: Medicare Other | Source: Ambulatory Visit | Attending: Internal Medicine | Admitting: Internal Medicine

## 2020-07-13 ENCOUNTER — Encounter (HOSPITAL_COMMUNITY): Payer: Self-pay | Admitting: Hematology

## 2020-07-13 ENCOUNTER — Other Ambulatory Visit: Payer: Self-pay

## 2020-07-13 ENCOUNTER — Inpatient Hospital Stay (HOSPITAL_COMMUNITY): Payer: Medicare Other | Attending: Hematology | Admitting: Hematology

## 2020-07-13 VITALS — BP 128/70 | HR 82 | Temp 96.9°F | Resp 18 | Wt 166.0 lb

## 2020-07-13 DIAGNOSIS — M79601 Pain in right arm: Secondary | ICD-10-CM | POA: Diagnosis not present

## 2020-07-13 DIAGNOSIS — N644 Mastodynia: Secondary | ICD-10-CM | POA: Insufficient documentation

## 2020-07-13 DIAGNOSIS — Z170 Estrogen receptor positive status [ER+]: Secondary | ICD-10-CM

## 2020-07-13 DIAGNOSIS — Z7902 Long term (current) use of antithrombotics/antiplatelets: Secondary | ICD-10-CM | POA: Insufficient documentation

## 2020-07-13 DIAGNOSIS — I2510 Atherosclerotic heart disease of native coronary artery without angina pectoris: Secondary | ICD-10-CM

## 2020-07-13 DIAGNOSIS — Z853 Personal history of malignant neoplasm of breast: Secondary | ICD-10-CM | POA: Insufficient documentation

## 2020-07-13 DIAGNOSIS — R970 Elevated carcinoembryonic antigen [CEA]: Secondary | ICD-10-CM | POA: Diagnosis not present

## 2020-07-13 DIAGNOSIS — Z807 Family history of other malignant neoplasms of lymphoid, hematopoietic and related tissues: Secondary | ICD-10-CM | POA: Diagnosis not present

## 2020-07-13 DIAGNOSIS — C50212 Malignant neoplasm of upper-inner quadrant of left female breast: Secondary | ICD-10-CM

## 2020-07-13 DIAGNOSIS — Z923 Personal history of irradiation: Secondary | ICD-10-CM

## 2020-07-13 DIAGNOSIS — R928 Other abnormal and inconclusive findings on diagnostic imaging of breast: Secondary | ICD-10-CM | POA: Diagnosis not present

## 2020-07-13 IMAGING — MG DIGITAL DIAGNOSTIC BILAT W/ TOMO W/ CAD
8 series · 9 of 24 positions shown · non-contrast
Comparison: Previous exam(s).

CLINICAL DATA: 87-year-old female presenting with new diffuse
bilateral breast pain for approximately 1 week. Patient is
recovering from a recent infection and stated her whole torso has
been hurting.

EXAM:
DIGITAL DIAGNOSTIC BILATERAL MAMMOGRAM WITH TOMO AND CAD

[R CC synth-2D]
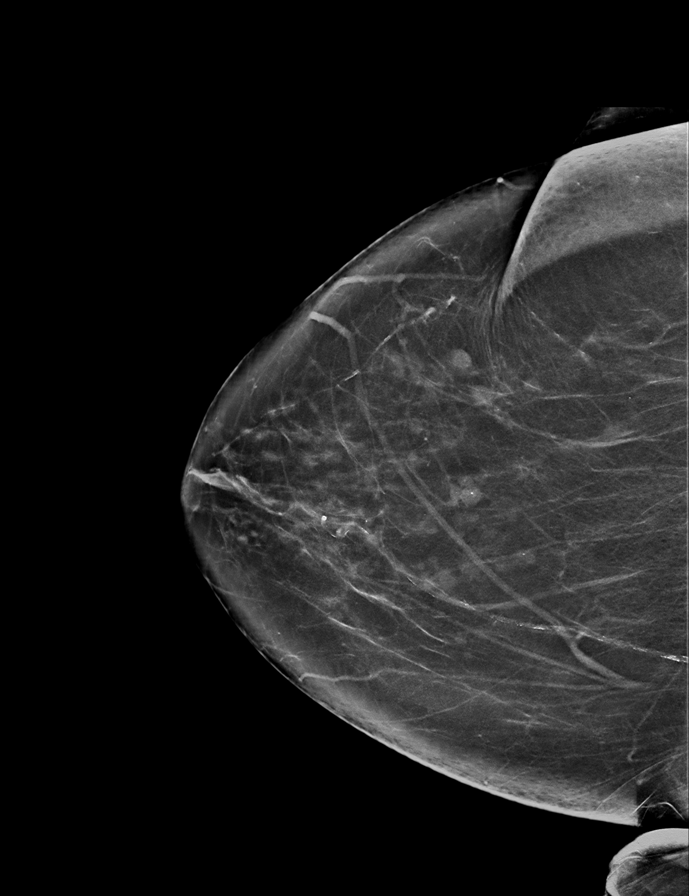

[L MLO synth-2D]
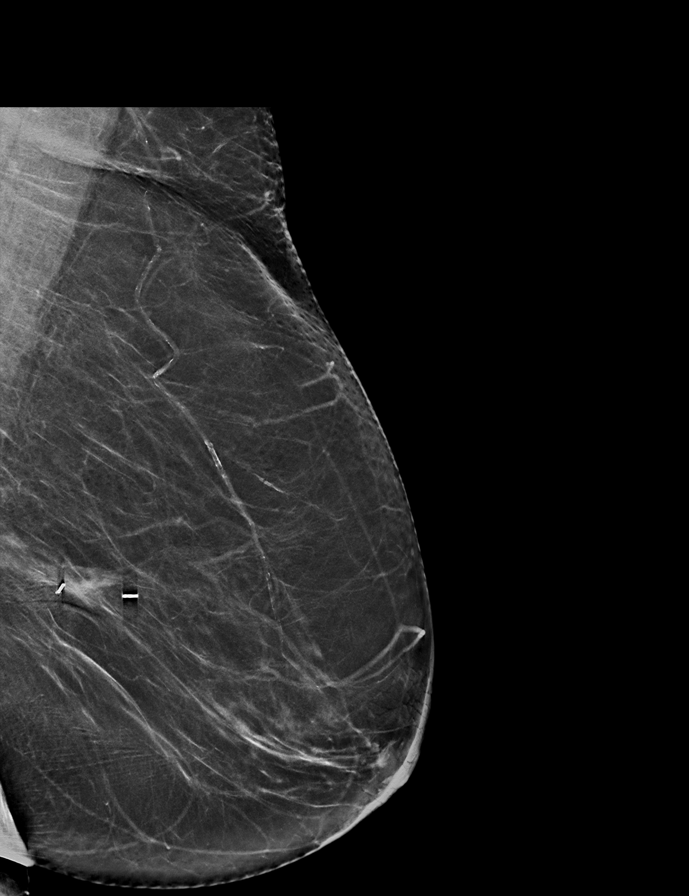

[R MLO synth-2D]
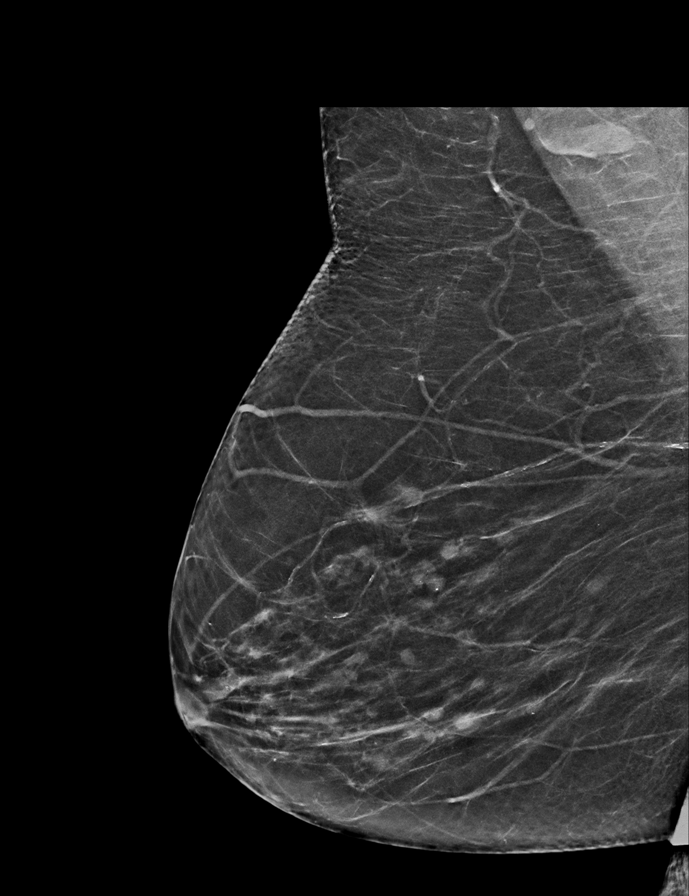

[L CC synth-2D]
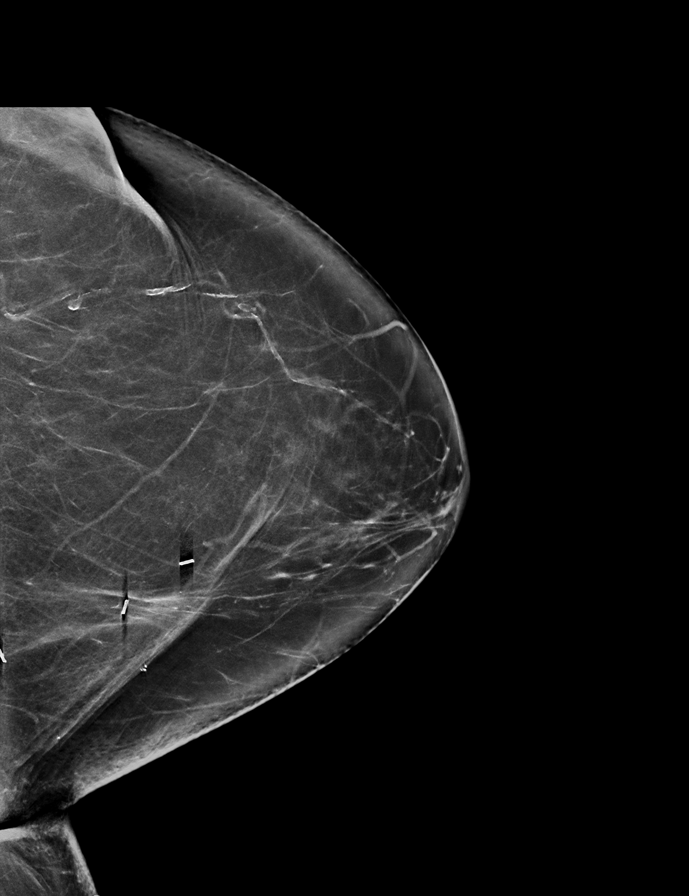

[R CC tomo · 2 of 67 frames shown]
[frame 22/67]
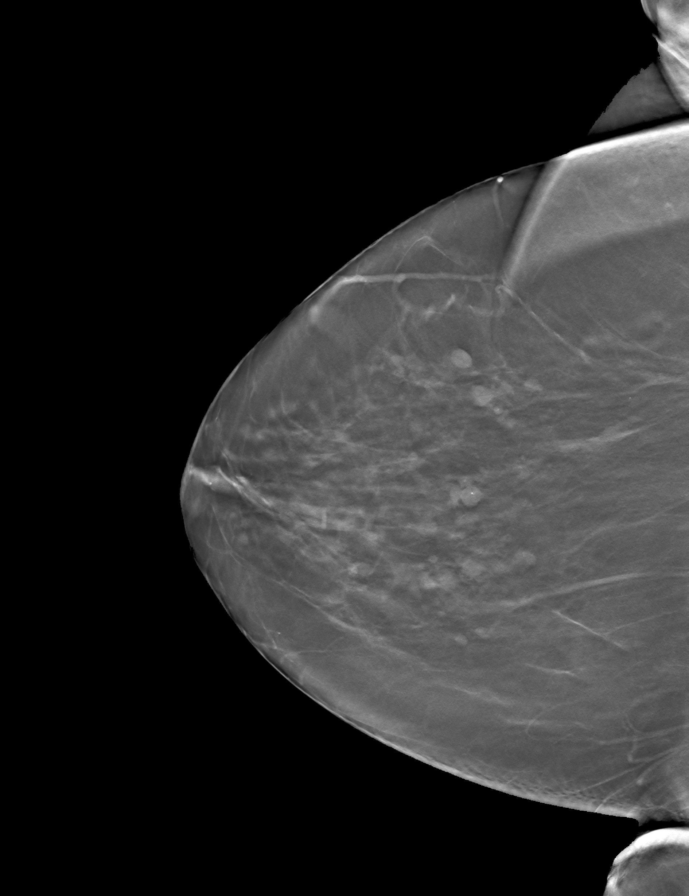
[frame 34/67]
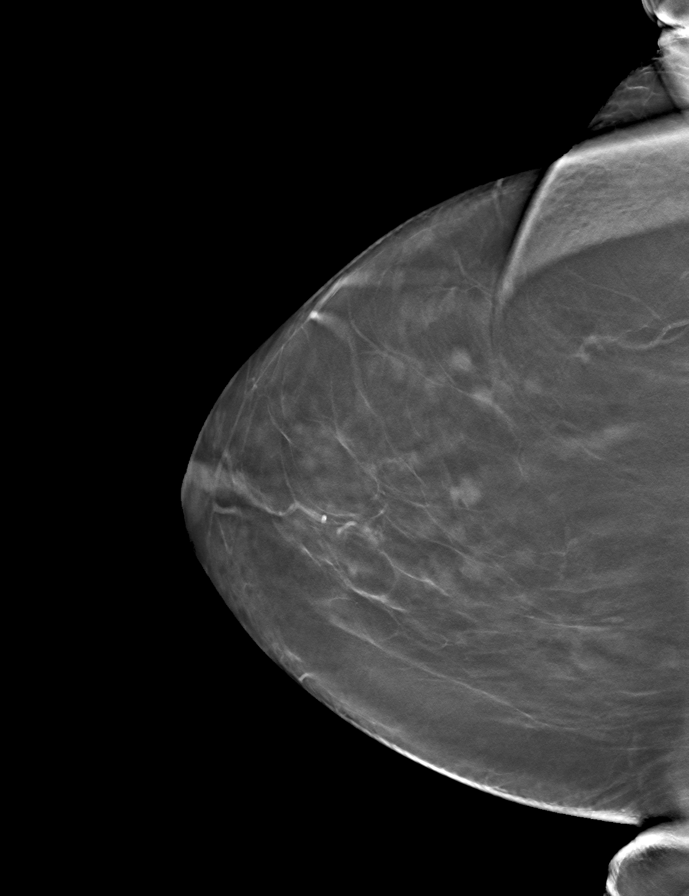

[R MLO tomo · tomo slice 31/60.0]
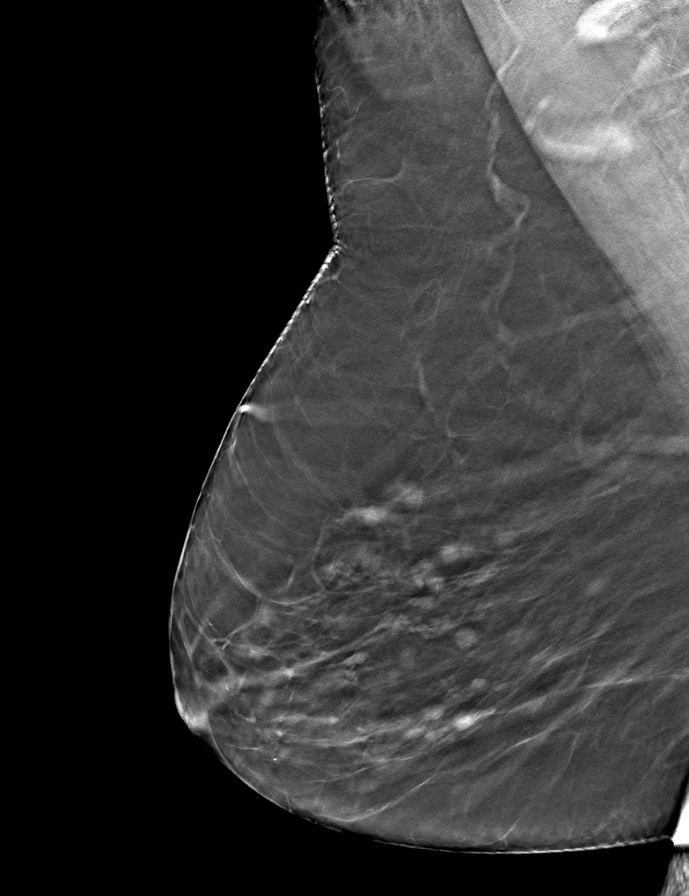

[L MLO tomo · tomo slice 35/69.0]
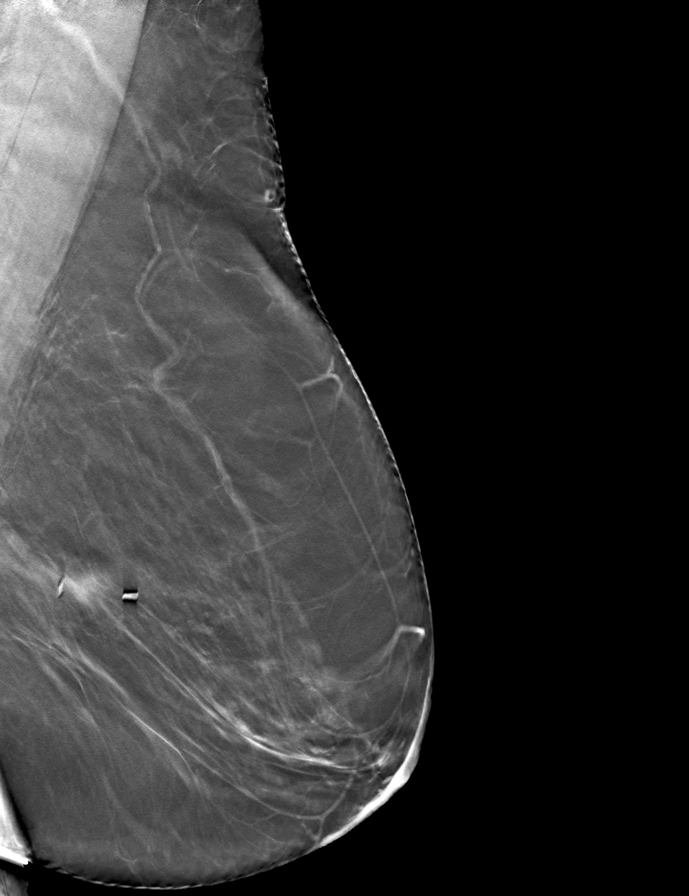

[L CC tomo · tomo slice 35/69.0]
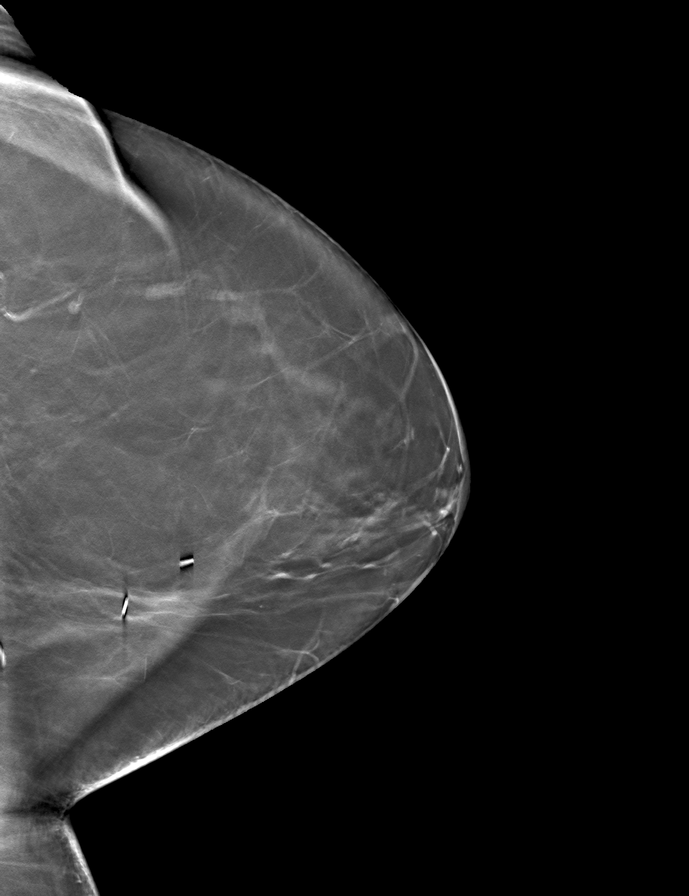

[9 of 24 positions shown; findings below may reference images not displayed]

ACR Breast Density Category b: There are scattered areas of
fibroglandular density.
FINDINGS: Right breast: No suspicious mass, distortion, or microcalcifications
are identified to suggest presence of malignancy.

Left breast: No suspicious mass, distortion, or microcalcifications
are identified to suggest presence of malignancy.

Mammographic images were processed with CAD.
IMPRESSION: No mammographic evidence of malignancy in the bilateral breasts or
other finding to explain the patient's diffuse bilateral breast
pain.

RECOMMENDATION:
1.  Clinical follow-up as needed for the bilateral breast pain.

2.  Screening mammogram in one year.(Code:[VB])

I have discussed the findings and recommendations with the patient.
If applicable, a reminder letter will be sent to the patient
regarding the next appointment.

BI-RADS CATEGORY  1: Negative.

## 2020-07-13 NOTE — Progress Notes (Signed)
I have called and updated the patient's daughter, Kara Woods regarding today's visit with Dr. Delton Coombes and the plan of care. I explained the recommendation from Dr. Delton Coombes for PET scan and to follow-up with following scan. Kara Woods was given the opportunity to ask questions and all were answered to her satisfaction. Kara Woods is very appreciative of my call. I provided my contact information and encouraged her to call with any questions or concerns.

## 2020-07-13 NOTE — Progress Notes (Signed)
[facility] [address], [place] [address]   CLINIC:  Medical Oncology/Hematology  CONSULT NOTE  Patient Care Team: Celene Squibb, MD as PCP - General (Internal Medicine) Troy Sine, MD as PCP - Cardiology (Cardiology) Gala Romney Cristopher Estimable, MD (Gastroenterology)  CHIEF COMPLAINTS/PURPOSE OF CONSULTATION:  Evaluation of elevated CEA  HISTORY OF PRESENTING ILLNESS:  [name] 87 y.o. female is here because of evaluation of elevated CEA, at the request of Dr. Allyn Kenner. Her CEA was 10.3 on 06/26/2020.  Today she complains of chest soreness mid-chest and pain in the right armpit and right arm and between the scapulas; it is alleviated with Aleve. She denies any recent weight loss, N/V/D, changes in BM, hematochezia or hematuria. She went to her PCP and was prescribed prednisone and oxycodone for her pain, which then turned out to be a UTI, and she was treated with Cipro.  She was diagnosed in June 2014 with low-grade ER/PR positive invasive lobular carcinoma of left breast and had 2 lumpectomies along with  radiation therapy. She had one colonoscopy in 2007, but denies any history of colon cancer. Her last She lives at home and she does all of her chores. She quit smoking in 2000 after smoking 2 PPD for 20 years. Her daughter has multiple myeloma; no other cancers in her family.  MEDICAL HISTORY:  Past Medical History:  Diagnosis Date  . Anxiety   . Arthritis   . Blind left eye   . Breast cancer ([facility])    left   . Colitis   . Coronary artery disease    a. 10/2016 Staged PCI: RCA 50p, 4m(2.25x12 Resolute Onyx DES), LCX 50p, 870m2.75x23 Xience Alpine DES). Residual LAD 50p/m, 4546m  . CMarland Kitchenstocele 10/11/2013  . Depression   . Diastolic dysfunction    a. 09/2016 Echo: EF 50%, diffuse hypokinesis, mild LVH, grade 1 diastolic dysfunction, mild mitral regurgitation, mildly dilated left atrium.  . GMarland KitchenRD (gastroesophageal reflux disease)    occasional  Tums only  . HOH (hard of hearing)   . Pelvic relaxation 10/11/2013  . Proctitis    colonsocopy 2007, Canasa suppositories  . S/P endoscopy March 2009   mild erosive reflux esophagitis, Schatzki's ring, s/p dilation  . Schatzki's ring   . Shortness of breath    occ if anxiety  . Wears dentures    upper  . Wears glasses     SURGICAL HISTORY: Past Surgical History:  Procedure Laterality Date  . ABDOMINAL HYSTERECTOMY    . ANTERIOR AND POSTERIOR REPAIR N/A 05/17/2014   Procedure: ANTERIOR (CYSTOCELE) AND POSTERIOR REPAIR (RECTOCELE);  Surgeon: ScoReece PackerD;  [facility];  Service: Urology;  Laterality: N/A;  . APPENDECTOMY    . BREAST LUMPECTOMY WITH NEEDLE LOCALIZATION AND AXILLARY SENTINEL LYMPH NODE BX Left 05/03/2013   Procedure: BREAST LUMPECTOMY WITH NEEDLE LOCALIZATION AND AXILLARY SENTINEL LYMPH NODE BX;  Surgeon: BenEdward JollyD;  [facility];  Laterality: Left;  . BREAST SURGERY Left 05/19/13  . CARDIAC CATHETERIZATION  1998  . CARDIAC CATHETERIZATION N/A 11/04/2016   Procedure: Left Heart Cath and Coronary Angiography;  Surgeon: ThoTroy SineD;  [facility]: [facility];  Service: Cardiovascular;  Laterality: N/A;  . CARDIAC CATHETERIZATION N/A 11/05/2016   Procedure: Coronary Stent Intervention;  Surgeon: ThoTroy SineD;  Location: [facility];  Service: Cardiovascular;  Laterality: N/A;  . CHOLECYSTECTOMY    .  COLONOSCOPY  05/06/2006   Diffuse inflammatory changes of the rectal mucosa, consistent  with proctitis.  Otherwise, normal colon to terminal ileum  . CORONARY ANGIOPLASTY  11/04/2016  . CORONARY STENT PLACEMENT     Drug-eluting coronary artery stent, non-bioabsorbable-polymer-coated  . CYSTOSCOPY N/A 05/17/2014   Procedure: CYSTOSCOPY;  Surgeon: Reece Packer, MD;  Location: [facility];  Service: Urology;  Laterality: N/A;  . ESOPHAGOGASTRODUODENOSCOPY  02/09/2008   Schatzki ring  status post dilation/Distal esophageal erosion consistent with mild erosive reflux  esophagitis, otherwise unremarkable esophagus, normal stomach, D1, D2.  . EYE SURGERY     lt cataract-implant  . EYE SURGERY     lt-lens repaced,l  . [place] SURGERY  1993  . RE-EXCISION OF BREAST LUMPECTOMY Left 05/19/2013   Procedure: RE-EXCISION OF BREAST LUMPECTOMY;  Surgeon: Edward Jolly, MD;  Location: [facility];  Service: General;  Laterality: Left;  . RETINAL DETACHMENT SURGERY  1978   Left  . VAGINAL HYSTERECTOMY Bilateral 05/17/2014   Procedure: HYSTERECTOMY VAGINAL with Bilateral Salpingo-Oophorectomy; Bladder cystotomy repair;  Surgeon: Marvene Staff, MD;  Location: [facility];  Service: Gynecology;  Laterality: Bilateral;  . VAGINAL PROLAPSE REPAIR N/A 05/17/2014   Procedure: VAGINAL VAULT PROLAPSE AND GRAFT;  Surgeon: Reece Packer, MD;  Location: [facility];  Service: Urology;  Laterality: N/A;    SOCIAL HISTORY: Social History   Socioeconomic History  . Marital status: Widowed    Spouse name: Not on file  . Number of children: 6  . Years of education: Not on file  . Highest education level: Not on file  Occupational History  . Occupation: Retired  Tobacco Use  . Smoking status: Former Smoker    Packs/day: 1.00    Types: Cigarettes    Quit date: 04/28/1990    Years since quitting: 30.2  . Smokeless tobacco: Never Used  . Tobacco comment: quit 28 yrs ago  Substance and Sexual Activity  . Alcohol use: Yes    Alcohol/week: 1.0 standard drink    Types: 1 Glasses of wine per week    Comment: occ  . Drug use: No  . Sexual activity: Not Currently    Birth control/protection: Post-menopausal  Other Topics Concern  . Not on file  Social History Narrative  . Not on file   Social Determinants of Health   Financial Resource Strain:   . Difficulty of Paying Living Expenses: Not on file  Food Insecurity:   . Worried About Charity fundraiser in the Last  Year: Not on file  . Ran Out of Food in the Last Year: Not on file  Transportation Needs:   . Lack of Transportation (Medical): Not on file  . Lack of Transportation (Non-Medical): Not on file  Physical Activity:   . Days of Exercise per Week: Not on file  . Minutes of Exercise per Session: Not on file  Stress:   . Feeling of Stress : Not on file  Social Connections:   . Frequency of Communication with Friends and Family: Not on file  . Frequency of Social Gatherings with Friends and Family: Not on file  . Attends Religious Services: Not on file  . Active Member of Clubs or Organizations: Not on file  . Attends Archivist Meetings: Not on file  . Marital Status: Not on file  Intimate Partner Violence:   . Fear of Current or Ex-Partner: Not on file  . Emotionally Abused: Not on file  . Physically  Abused: Not on file  . Sexually Abused: Not on file    FAMILY HISTORY: Family History  Problem Relation Age of Onset  . Cancer Brother        spinal  . Heart attack Mother   . Heart attack Father   . Heart attack Son   . Heart attack Son   . Depression Son   . Multiple myeloma Daughter   . Anemia Daughter   . Colon cancer Neg Hx     ALLERGIES:  is allergic to contrast media [iodinated diagnostic agents], lipitor [atorvastatin], sulfamethoxazole, and sulfonamide derivatives.  MEDICATIONS:  Current Outpatient Medications  Medication Sig Dispense Refill  . acetaminophen (TYLENOL) 500 MG tablet Take 500-1,000 mg by mouth every 6 (six) hours as needed for mild pain.    Marland Kitchen aspirin EC 81 MG tablet Take 1 tablet (81 mg total) by mouth daily. 90 tablet 3  . carboxymethylcellulose (REFRESH PLUS) 0.5 % SOLN Place 3-4 drops into both eyes 3 (three) times daily as needed (dry eyes). Preservative free    . clopidogrel (PLAVIX) 75 MG tablet Take 75 mg by mouth daily.     . metoprolol tartrate (LOPRESSOR) 25 MG tablet TAKE 1/2 TABLET BY MOUTH TWICE A DAY 90 tablet 3  . nitroGLYCERIN  (NITROSTAT) 0.4 MG SL tablet Place 1 tablet (0.4 mg total) under the tongue every 5 (five) minutes as needed. 25 tablet 3  . Travoprost, BAK Free, (TRAVATAN) 0.004 % SOLN ophthalmic solution Place 1 drop into both eyes at bedtime.      No current facility-administered medications for this visit.    REVIEW OF SYSTEMS:   Review of Systems  Constitutional: Positive for appetite change (moderately decreased) and fatigue (depleted).  Respiratory: Positive for cough (clear yellow sputum) and shortness of breath.   Cardiovascular: Positive for chest pain (chest soreness). Negative for leg swelling.  Genitourinary: Positive for frequency.   Musculoskeletal: Positive for arthralgias (7/10 R armpit and arm).  Neurological: Positive for dizziness.  Psychiatric/Behavioral: Positive for sleep disturbance. The patient is nervous/anxious.   All other systems reviewed and are negative.    PHYSICAL EXAMINATION: ECOG PERFORMANCE STATUS: 1 - Symptomatic but completely ambulatory  Vitals:   07/13/20 1324  BP: 128/70  Pulse: 82  Resp: 18  Temp: (!) 96.9 F (36.1 C)  SpO2: 97%   Filed Weights   07/13/20 1324  Weight: 166 lb (75.3 kg)   Physical Exam Vitals reviewed.  Constitutional:      Appearance: Normal appearance.  Cardiovascular:     Rate and Rhythm: Normal rate and regular rhythm.     Pulses: Normal pulses.     Heart sounds: Normal heart sounds.  Pulmonary:     Effort: Pulmonary effort is normal.     Breath sounds: Normal breath sounds.  Chest:    Musculoskeletal:     Right lower leg: No edema.     Left lower leg: No edema.  Lymphadenopathy:     Cervical: No cervical adenopathy.     Upper Body:     Right upper body: No supraclavicular or axillary adenopathy.     Left upper body: No supraclavicular or axillary adenopathy.  Neurological:     General: No focal deficit present.     Mental Status: She is alert and oriented to person, place, and time.  Psychiatric:        Mood  and Affect: Mood normal.        Behavior: Behavior normal.  LABORATORY DATA:  I have reviewed the data as listed CBC Latest Ref Rng & Units 09/01/2019 03/03/2018 06/24/2017  WBC 4.0 - 10.5 K/uL 8.4 9.9 8.0  Hemoglobin 12.0 - 15.0 g/dL 12.9 12.9 12.4  Hematocrit 36 - 46 % 40.5 39.6 37.6  Platelets 150 - 400 K/uL 263 255 269   CMP Latest Ref Rng & Units 09/01/2019 03/03/2018 06/24/2017  Glucose 70 - 99 mg/dL 101(H) 137(H) 96  BUN 8 - 23 mg/dL 11 8 12   Creatinine 0.44 - 1.00 mg/dL 0.73 0.79 0.77  Sodium 135 - 145 mmol/L 140 138 140  Potassium 3.5 - 5.1 mmol/L 4.1 3.7 4.8  Chloride 98 - 111 mmol/L 106 105 106  CO2 22 - 32 mmol/L 25 24 28   Calcium 8.9 - 10.3 mg/dL 8.9 9.2 8.9  Total Protein 6.5 - 8.1 g/dL - 7.3 7.2  Total Bilirubin 0.3 - 1.2 mg/dL - 0.6 0.7  Alkaline Phos 38 - 126 U/L - 61 58  AST 15 - 41 U/L - 18 24  ALT 14 - 54 U/L - 11(L) 15    RADIOGRAPHIC STUDIES: I have personally reviewed the radiological images as listed and agreed with the findings in the report. MM DIAG BREAST TOMO BILATERAL  Result Date: 07/13/2020 CLINICAL DATA:  86 year old female presenting with new diffuse bilateral breast pain for approximately 1 week. Patient is recovering from a recent infection and stated her whole torso has been hurting. EXAM: DIGITAL DIAGNOSTIC BILATERAL MAMMOGRAM WITH TOMO AND CAD COMPARISON:  Previous exam(s). ACR Breast Density Category b: There are scattered areas of fibroglandular density. FINDINGS: Right breast: No suspicious mass, distortion, or microcalcifications are identified to suggest presence of malignancy. Left breast: No suspicious mass, distortion, or microcalcifications are identified to suggest presence of malignancy. Mammographic images were processed with CAD. IMPRESSION: No mammographic evidence of malignancy in the bilateral breasts or other finding to explain the patient's diffuse bilateral breast pain. RECOMMENDATION: 1.  Clinical follow-up as needed for the  bilateral breast pain. 2.  Screening mammogram in one year.(Code:SM-B-01Y) I have discussed the findings and recommendations with the patient. If applicable, a reminder letter will be sent to the patient regarding the next appointment. BI-RADS CATEGORY  1: Negative. Electronically Signed   By: Audie Pinto M.D.   On: 07/13/2020 10:44    ASSESSMENT:  1.  T2N0 left breast infiltrating lobular carcinoma: -Status post lumpectomy in June 2014, 1.4 cm, grade 1, lymphovascular invasion positive, ER 100%, PR 26%, Ki-67 18%. -She underwent XRT.  She could not tolerate adjuvant endocrine therapy. -Mammogram on 07/13/2020, BI-RADS Category 1. -CEA was 10.3, Ca1 2515.8 and CA 19-9 05.  2.  Social/family history: -She quit smoking 20 years ago, smoked 2 packs/day for more than 20 years prior to quitting. -She lives at home by herself and is independent of all activities. -Daughter who has multiple myeloma at age 65.   PLAN:  1.  Right chest wall pain with elevated CEA: -She complained of right-sided chest wall pain for the past few weeks.  This has slightly improved with prednisone and oxycodone. -She denied any weight loss.  No trauma to that area.  Reports severe fatigue associated with it. -Labs done by her PMD showed CEA elevated at 10.3.  CA-125 and CA 19-9 was normal. -Given her history of breast cancer, I have recommended PET CT scan.  I had extensive discussion about her symptoms and differential diagnosis. -I will see her back after the PET scan to discuss results.  2.  CAD and RCA stenting: -Continue metoprolol and Plavix.   All questions were answered. The patient knows to call the clinic with any problems, questions or concerns. Total time spent is 60 minutes with more than 80% of the time spent face-to-face discussing and reviewing lab results, differential diagnosis, counseling and coordination of care.  Derek Jack, MD, 07/13/20 2:29 PM  [place] [phone]   I, Milinda Antis, am acting as a scribe for Dr. Sanda Linger.  I, Derek Jack MD, have reviewed the above documentation for accuracy and completeness, and I agree with the above.

## 2020-07-13 NOTE — Patient Instructions (Signed)
[facility] at [facility] Discharge Instructions  You were seen and examined today by Dr. Delton Coombes. Dr. Delton Coombes is a medical oncologist and hematologist, meaning he specializes in cancer and blood disorders. You were referred to the cancer center by Dr. Nevada Crane due to abnormal lab work. Dr. Delton Coombes discussed your past medical history, personal and family history of cancer and current functional status.  On your recent lab work, you CEA, which is often an indicator of cancer present in your body, was elevated. This is especially concerning due to your history of breast cancer.  Dr. Delton Coombes has recommended further work up. Dr. Delton Coombes is going to order a PET scan. This will help determine why the CEA is elevated as it highlights any cancer that may be present in the body.  You will return to the clinic following the PET scan.   Thank you for choosing [facility] at [facility] to provide your oncology and hematology care.  To afford each patient quality time with our provider, please arrive at least 15 minutes before your scheduled appointment time.   If you have a lab appointment with the [facility] please come in thru the Main Entrance and check in at the main information desk.  You need to re-schedule your appointment should you arrive 10 or more minutes late.  We strive to give you quality time with our providers, and arriving late affects you and other patients whose appointments are after yours.  Also, if you no show three or more times for appointments you may be dismissed from the clinic at the providers discretion.     Again, thank you for choosing [facility].  Our hope is that these requests will decrease the amount of time that you wait before being seen by our physicians.       _____________________________________________________________  Should you have questions after your visit to [facility],  please contact our office at [phone] and follow the prompts.  Our office hours are 8:00 a.m. and 4:30 p.m. Monday - Friday.  Please note that voicemails left after 4:00 p.m. may not be returned until the following business day.  We are closed weekends and major holidays.  You do have access to a nurse 24-7, just call the main number to the clinic [phone] and do not press any options, hold on the line and a nurse will answer the phone.    For prescription refill requests, have your pharmacy contact our office and allow 72 hours.    Due to Covid, you will need to wear a mask upon entering the hospital. If you do not have a mask, a mask will be given to you at the Main Entrance upon arrival. For doctor visits, patients may have 1 support person age 19 or older with them. For treatment visits, patients can not have anyone with them due to social distancing guidelines and our immunocompromised population.

## 2020-07-13 NOTE — Progress Notes (Signed)
I met with patient today during initial visit with Dr. Delton Coombes. I introduced myself and explained my role in the patient's care. I encouraged the patient to call with any questions or concerns.

## 2020-07-17 ENCOUNTER — Ambulatory Visit (HOSPITAL_COMMUNITY): Payer: Medicare Other | Admitting: Hematology

## 2020-07-24 ENCOUNTER — Observation Stay (HOSPITAL_COMMUNITY): Payer: Medicare Other

## 2020-07-24 ENCOUNTER — Observation Stay (HOSPITAL_COMMUNITY)
Admission: EM | Admit: 2020-07-24 | Discharge: 2020-07-24 | Disposition: A | Discharge status: Home / Self Care | Payer: Medicare Other | Attending: Family Medicine | Admitting: Family Medicine

## 2020-07-24 ENCOUNTER — Emergency Department (HOSPITAL_COMMUNITY): Payer: Medicare Other

## 2020-07-24 ENCOUNTER — Encounter (HOSPITAL_COMMUNITY): Payer: Self-pay

## 2020-07-24 ENCOUNTER — Other Ambulatory Visit: Payer: Self-pay

## 2020-07-24 ENCOUNTER — Ambulatory Visit (HOSPITAL_COMMUNITY): Payer: Medicare Other | Admitting: Hematology

## 2020-07-24 ENCOUNTER — Encounter (HOSPITAL_COMMUNITY): Admission: RE | Admit: 2020-07-24 | Payer: Medicare Other | Source: Ambulatory Visit

## 2020-07-24 ENCOUNTER — Observation Stay (HOSPITAL_BASED_OUTPATIENT_CLINIC_OR_DEPARTMENT_OTHER): Payer: Medicare Other

## 2020-07-24 DIAGNOSIS — I361 Nonrheumatic tricuspid (valve) insufficiency: Secondary | ICD-10-CM

## 2020-07-24 DIAGNOSIS — I672 Cerebral atherosclerosis: Secondary | ICD-10-CM | POA: Diagnosis not present

## 2020-07-24 DIAGNOSIS — I119 Hypertensive heart disease without heart failure: Secondary | ICD-10-CM | POA: Insufficient documentation

## 2020-07-24 DIAGNOSIS — I25110 Atherosclerotic heart disease of native coronary artery with unstable angina pectoris: Secondary | ICD-10-CM | POA: Insufficient documentation

## 2020-07-24 DIAGNOSIS — R29818 Other symptoms and signs involving the nervous system: Secondary | ICD-10-CM | POA: Diagnosis not present

## 2020-07-24 DIAGNOSIS — G9389 Other specified disorders of brain: Secondary | ICD-10-CM | POA: Diagnosis not present

## 2020-07-24 DIAGNOSIS — R4781 Slurred speech: Secondary | ICD-10-CM | POA: Diagnosis not present

## 2020-07-24 DIAGNOSIS — I639 Cerebral infarction, unspecified: Secondary | ICD-10-CM | POA: Diagnosis not present

## 2020-07-24 DIAGNOSIS — I6389 Other cerebral infarction: Secondary | ICD-10-CM | POA: Diagnosis not present

## 2020-07-24 DIAGNOSIS — Z853 Personal history of malignant neoplasm of breast: Secondary | ICD-10-CM | POA: Diagnosis not present

## 2020-07-24 DIAGNOSIS — I7090 Unspecified atherosclerosis: Secondary | ICD-10-CM | POA: Diagnosis not present

## 2020-07-24 DIAGNOSIS — Z87891 Personal history of nicotine dependence: Secondary | ICD-10-CM | POA: Diagnosis not present

## 2020-07-24 DIAGNOSIS — R404 Transient alteration of awareness: Secondary | ICD-10-CM | POA: Diagnosis not present

## 2020-07-24 DIAGNOSIS — R531 Weakness: Secondary | ICD-10-CM | POA: Diagnosis not present

## 2020-07-24 DIAGNOSIS — I6523 Occlusion and stenosis of bilateral carotid arteries: Secondary | ICD-10-CM | POA: Diagnosis not present

## 2020-07-24 DIAGNOSIS — G459 Transient cerebral ischemic attack, unspecified: Principal | ICD-10-CM | POA: Diagnosis present

## 2020-07-24 DIAGNOSIS — G319 Degenerative disease of nervous system, unspecified: Secondary | ICD-10-CM | POA: Diagnosis not present

## 2020-07-24 DIAGNOSIS — I10 Essential (primary) hypertension: Secondary | ICD-10-CM

## 2020-07-24 DIAGNOSIS — R55 Syncope and collapse: Secondary | ICD-10-CM

## 2020-07-24 DIAGNOSIS — R410 Disorientation, unspecified: Secondary | ICD-10-CM | POA: Diagnosis not present

## 2020-07-24 DIAGNOSIS — I6782 Cerebral ischemia: Secondary | ICD-10-CM | POA: Diagnosis not present

## 2020-07-24 DIAGNOSIS — I2510 Atherosclerotic heart disease of native coronary artery without angina pectoris: Secondary | ICD-10-CM

## 2020-07-24 LAB — COMPREHENSIVE METABOLIC PANEL
ALT: 16 U/L (ref 0–44)
AST: 18 U/L (ref 15–41)
Albumin: 4.1 g/dL (ref 3.5–5.0)
Alkaline Phosphatase: 68 U/L (ref 38–126)
Anion gap: 10 (ref 5–15)
BUN: 20 mg/dL (ref 8–23)
CO2: 24 mmol/L (ref 22–32)
Calcium: 9 mg/dL (ref 8.9–10.3)
Chloride: 103 mmol/L (ref 98–111)
Creatinine, Ser: 0.74 mg/dL (ref 0.44–1.00)
GFR calc Af Amer: >60 mL/min (ref >60)
GFR calc non Af Amer: >60 mL/min (ref >60)
Glucose, Bld: 94 mg/dL (ref 70–99)
Potassium: 4 mmol/L (ref 3.5–5.1)
Sodium: 137 mmol/L (ref 135–145)
Total Bilirubin: 0.6 mg/dL (ref 0.3–1.2)
Total Protein: 7.3 g/dL (ref 6.5–8.1)

## 2020-07-24 LAB — I-STAT CHEM 8, ED
BUN: 19 mg/dL (ref 8–23)
Calcium, Ion: 1.16 mmol/L (ref 1.15–1.40)
Chloride: 102 mmol/L (ref 98–111)
Creatinine, Ser: 0.7 mg/dL (ref 0.44–1.00)
Glucose, Bld: 90 mg/dL (ref 70–99)
HCT: 41 % (ref 36.0–46.0)
Hemoglobin: 13.9 g/dL (ref 12.0–15.0)
Potassium: 4.2 mmol/L (ref 3.5–5.1)
Sodium: 141 mmol/L (ref 135–145)
TCO2: 26 mmol/L (ref 22–32)

## 2020-07-24 LAB — ECHOCARDIOGRAM COMPLETE
AR max vel: 1.62 cm2
AV Area VTI: 1.93 cm2
AV Area mean vel: 1.71 cm2
AV Mean grad: 3.1 mmHg
AV Peak grad: 5.6 mmHg
Ao pk vel: 1.19 m/s
Area-P 1/2: 2.44 cm2
S' Lateral: 3.1 cm

## 2020-07-24 LAB — DIFFERENTIAL
Abs Immature Granulocytes: 0.05 10*3/uL (ref 0.00–0.07)
Basophils Absolute: 0.1 10*3/uL (ref 0.0–0.1)
Basophils Relative: 1 %
Eosinophils Absolute: 0.4 10*3/uL (ref 0.0–0.5)
Eosinophils Relative: 3 %
Immature Granulocytes: 1 %
Lymphocytes Relative: 27 %
Lymphs Abs: 2.9 10*3/uL (ref 0.7–4.0)
Monocytes Absolute: 0.6 10*3/uL (ref 0.1–1.0)
Monocytes Relative: 6 %
Neutro Abs: 6.5 10*3/uL (ref 1.7–7.7)
Neutrophils Relative %: 62 %

## 2020-07-24 LAB — CBC
HCT: 41.5 % (ref 36.0–46.0)
Hemoglobin: 13 g/dL (ref 12.0–15.0)
MCH: 29.5 pg (ref 26.0–34.0)
MCHC: 31.3 g/dL (ref 30.0–36.0)
MCV: 94.1 fL (ref 80.0–100.0)
Platelets: 226 10*3/uL (ref 150–400)
RBC: 4.41 MIL/uL (ref 3.87–5.11)
RDW: 13.6 % (ref 11.5–15.5)
WBC: 10.5 10*3/uL (ref 4.0–10.5)
nRBC: 0 % (ref 0.0–0.2)

## 2020-07-24 LAB — PROTIME-INR
INR: 1 (ref 0.8–1.2)
Prothrombin Time: 12.8 seconds (ref 11.4–15.2)

## 2020-07-24 LAB — ETHANOL: Alcohol, Ethyl (B): <10 mg/dL (ref <10)

## 2020-07-24 LAB — CBG MONITORING, ED: Glucose-Capillary: 73 mg/dL (ref 70–99)

## 2020-07-24 LAB — APTT: aPTT: 29 seconds (ref 24–36)

## 2020-07-24 IMAGING — CT CT HEAD CODE STROKE
3 series · 15 of 47 positions shown, 18 images · non-contrast
Comparison: [DATE]

CLINICAL DATA: Code stroke.  Abnormal speech, right-sided weakness

EXAM:
CT HEAD WITHOUT CONTRAST
TECHNIQUE: Contiguous axial images were obtained from the base of the skull
through the vertex without intravenous contrast.

[Series 2: head w o · axial · 0.47mm/px · z∈[+1528,+1658]mm · 9 of 32 slices shown, 12 images]
[im 3/32  brain]
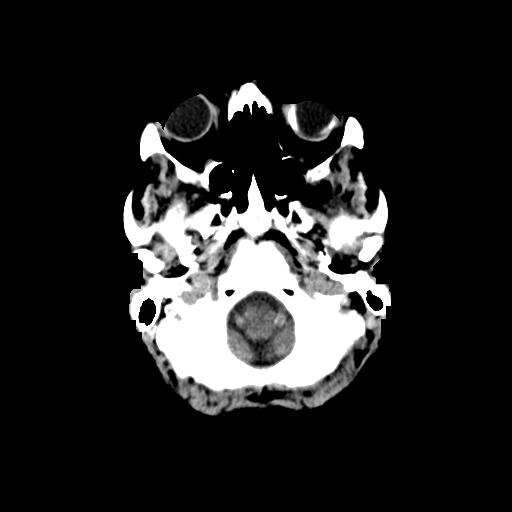
[im 3/32  bone]
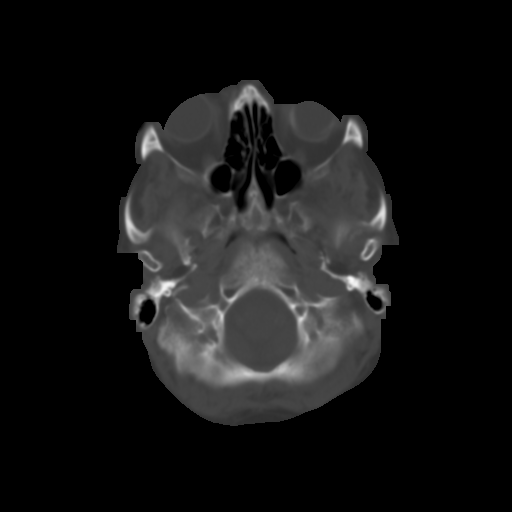
[im 6/32  brain]
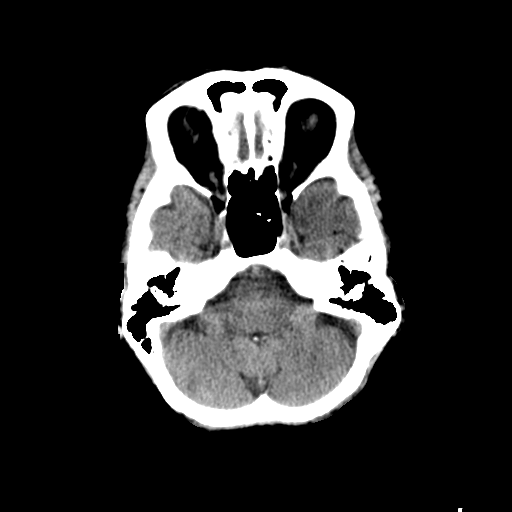
[im 9/32  brain]
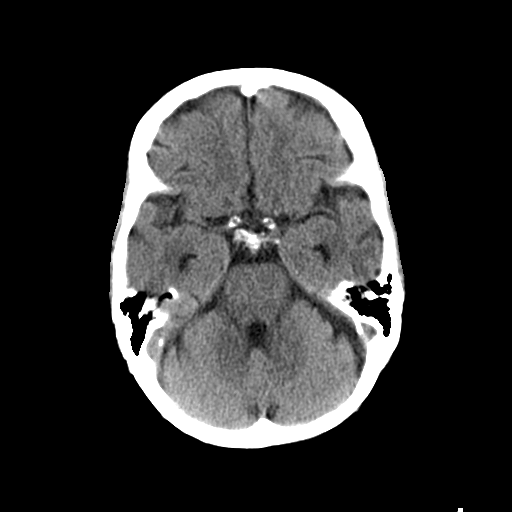
[im 12/32  brain]
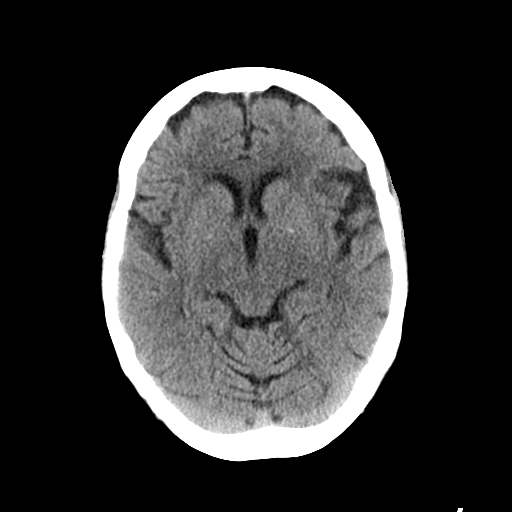
[im 17/32  brain]
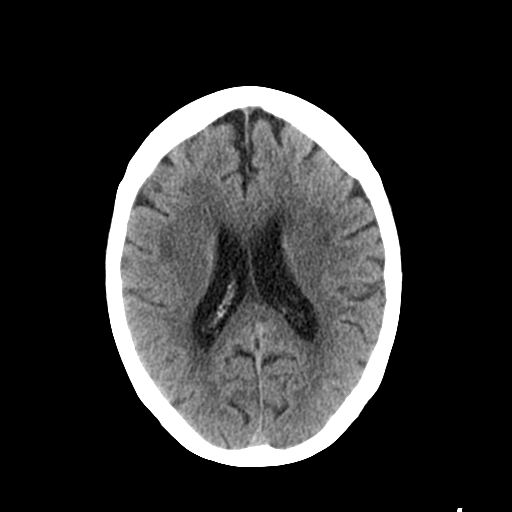
[im 17/32  bone]
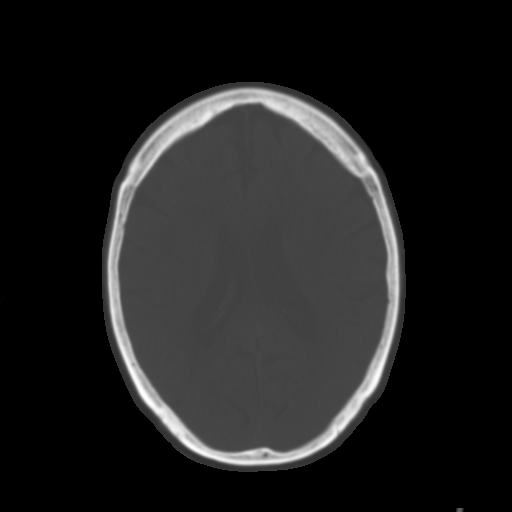
[im 20/32  brain]
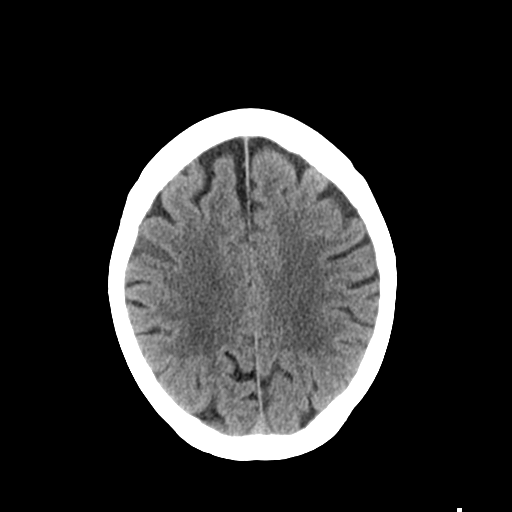
[im 23/32  brain]
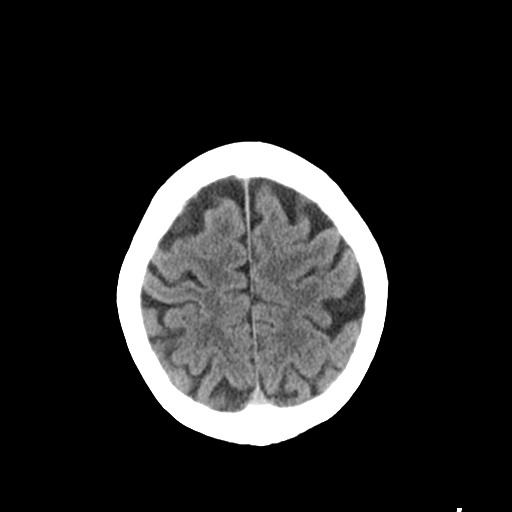
[im 26/32  brain]
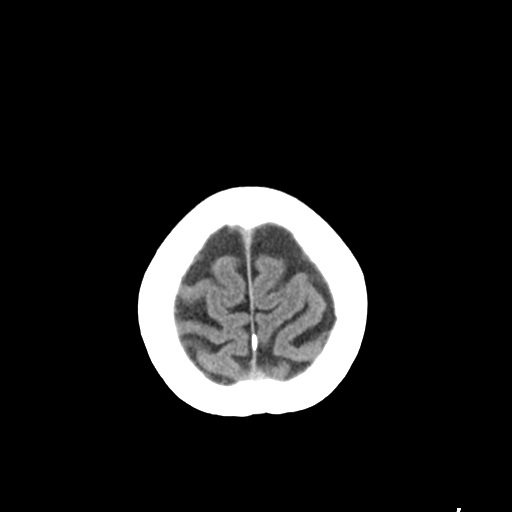
[im 29/32  brain]
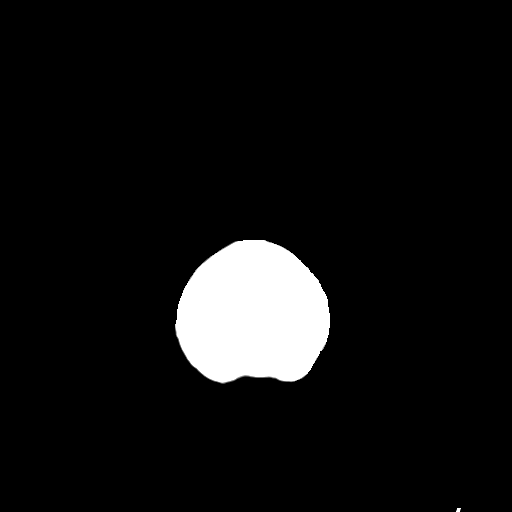
[im 29/32  bone]
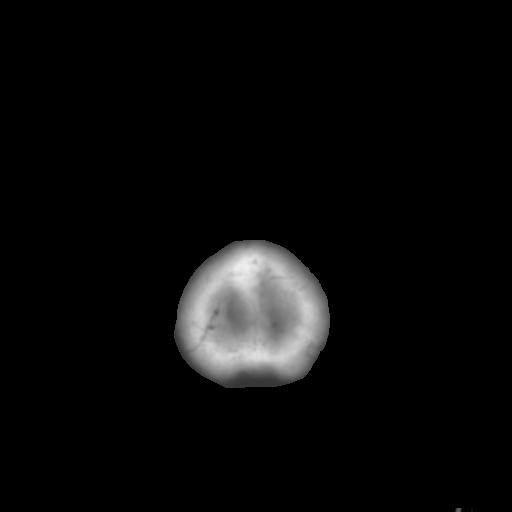

[Series 4: coronal soft · coronal · 0.34mm/px · 3 of 70 slices shown]
[im 24/70  brain]
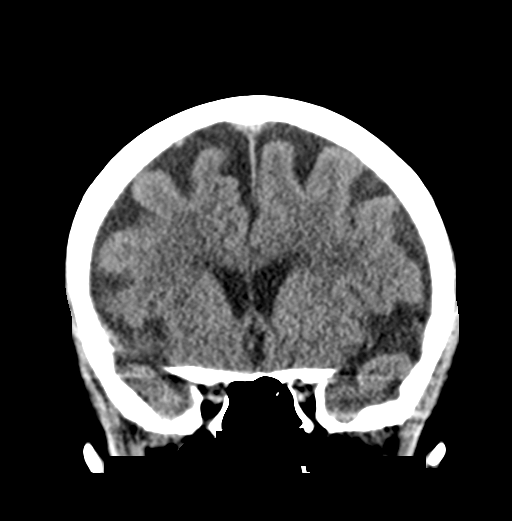
[im 31/70  brain]
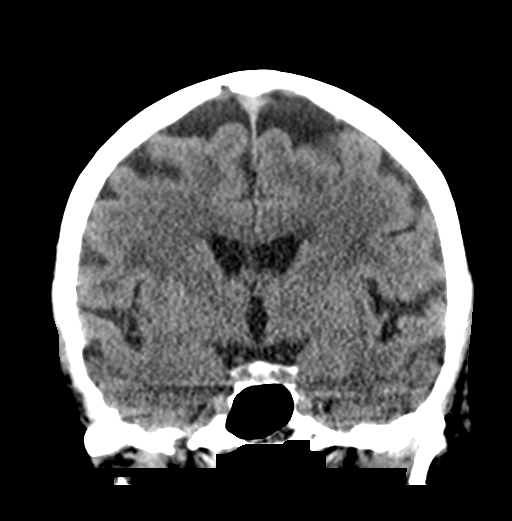
[im 39/70  brain]
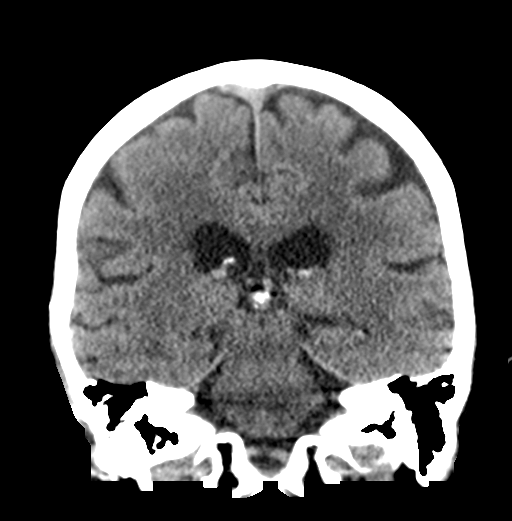

[Series 5: sagittal soft · sagittal · 0.36mm/px · 3 of 65 slices shown]
[im 22/65  brain]
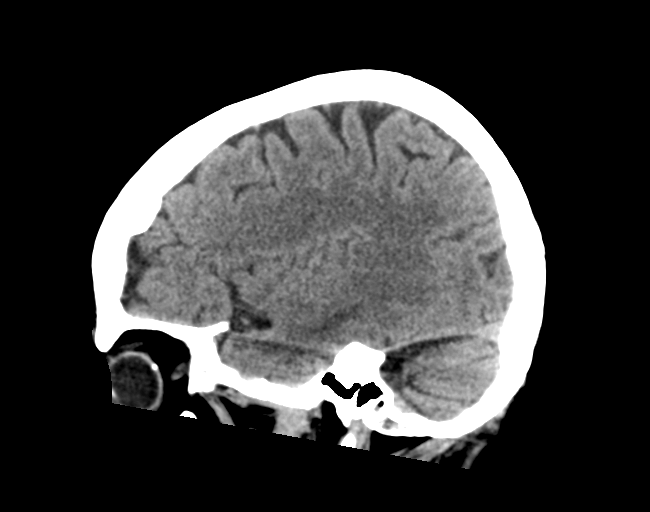
[im 33/65  brain]
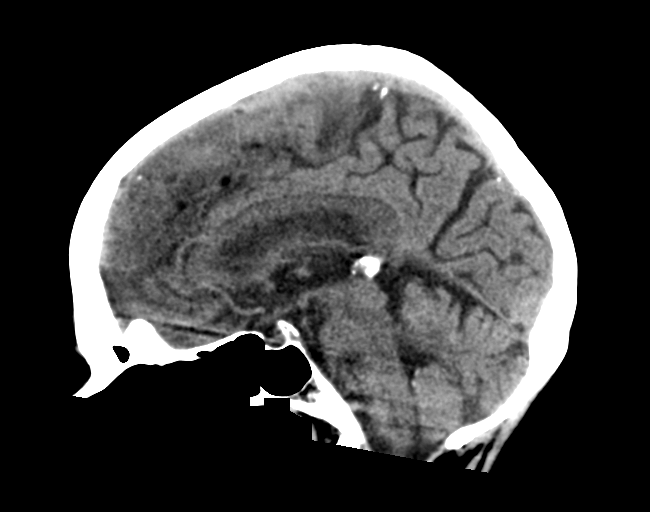
[im 43/65  brain]
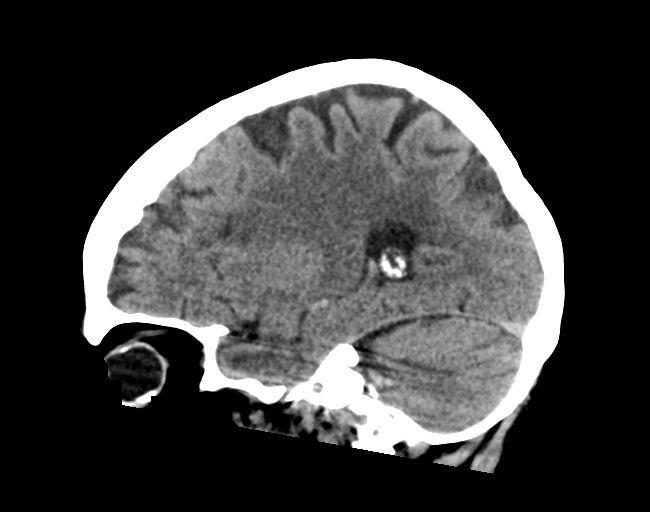

[15 of 47 positions shown; findings below may reference images not displayed]

FINDINGS: Brain: There is no acute intracranial hemorrhage, mass effect, or
edema. No new loss of gray-white differentiation. Patchy areas of
hypoattenuation in the supratentorial white matter probably reflects
stable chronic microvascular ischemic changes. Prominence of the
ventricles and sulci reflects minor generalized parenchymal volume
loss. There is no extra-axial fluid collection.

Vascular: No hyperdense vessel. Intracranial atherosclerotic
calcification is present at the skull base.

Skull: Unremarkable.

Sinuses/Orbits: No acute abnormality.

Other: Mastoid air cells are clear.

ASPECTS (Alberta Stroke Program Early CT Score)

- Ganglionic level infarction (caudate, lentiform nuclei, internal
capsule, insula, M1-M3 cortex): 7

- Supraganglionic infarction (M4-M6 cortex): 3

Total score (0-10 with 10 being normal): 10
IMPRESSION: No acute intracranial hemorrhage or evidence of acute infarction.
ASPECT score is 10.

Chronic microvascular ischemic changes.

These results were called by telephone at the time of interpretation
on [DATE] at [DATE] to provider XILDHIBAN , who verbally
acknowledged these results.

## 2020-07-24 IMAGING — US US CAROTID DUPLEX BILAT
1 series · 13 of 24 positions shown · non-contrast
Comparison: None.

CLINICAL DATA: Syncope and collapse. Right lower extremity
weakness. History of CAD, hypertension and hyperlipidemia. Former
smoker.

EXAM:
BILATERAL CAROTID DUPLEX ULTRASOUND
TECHNIQUE: Gray scale imaging, color Doppler and duplex ultrasound were
performed of bilateral carotid and vertebral arteries in the neck.

[Series 1: us carotid bilateral · 13 of 77 slices shown]
[im 1/77]
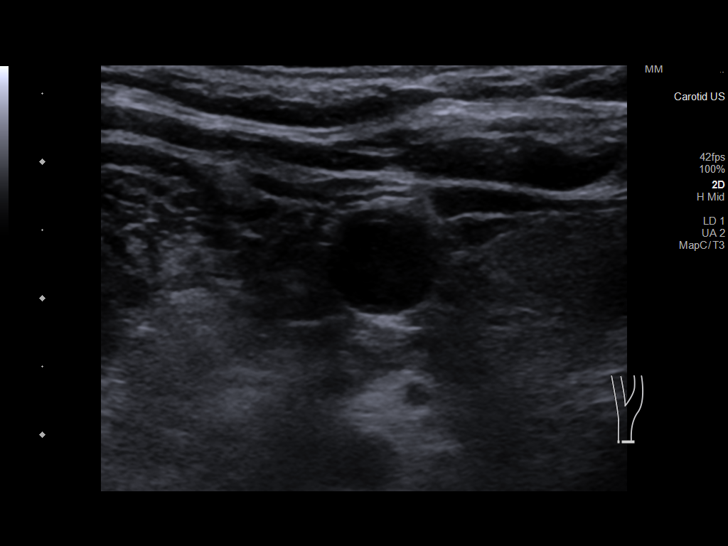
[im 7/77]
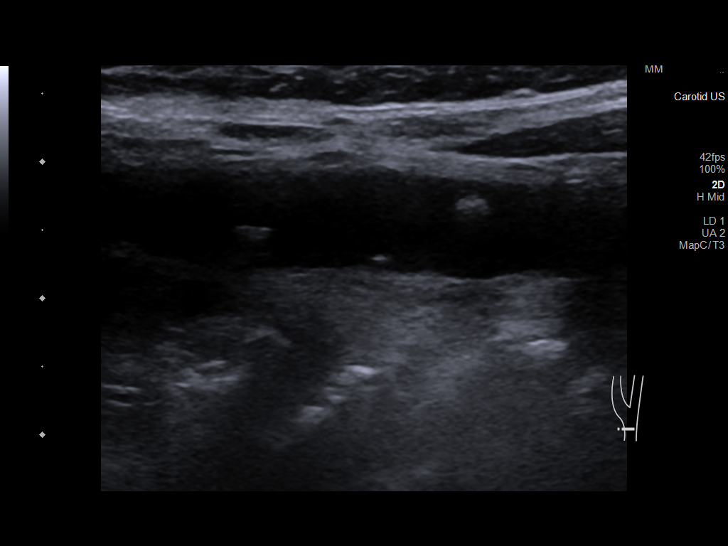
[im 14/77]
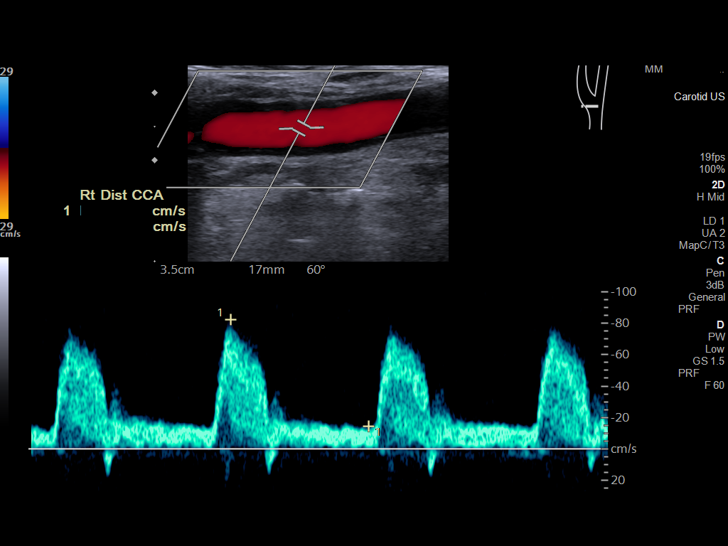
[im 20/77]
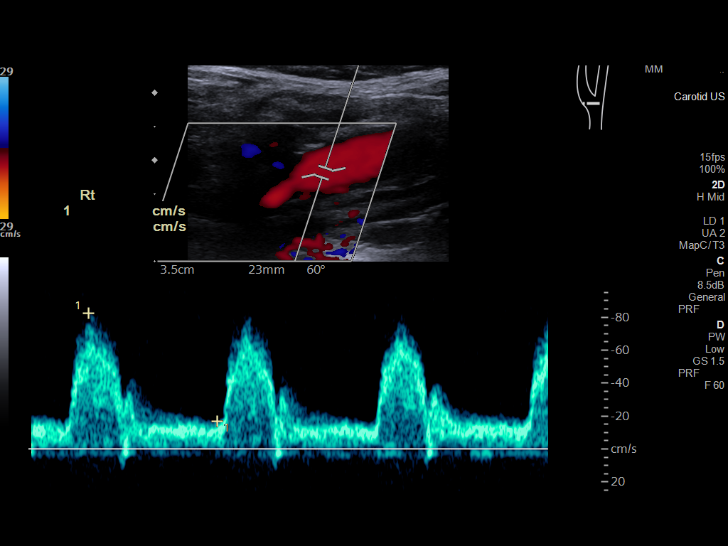
[im 27/77]
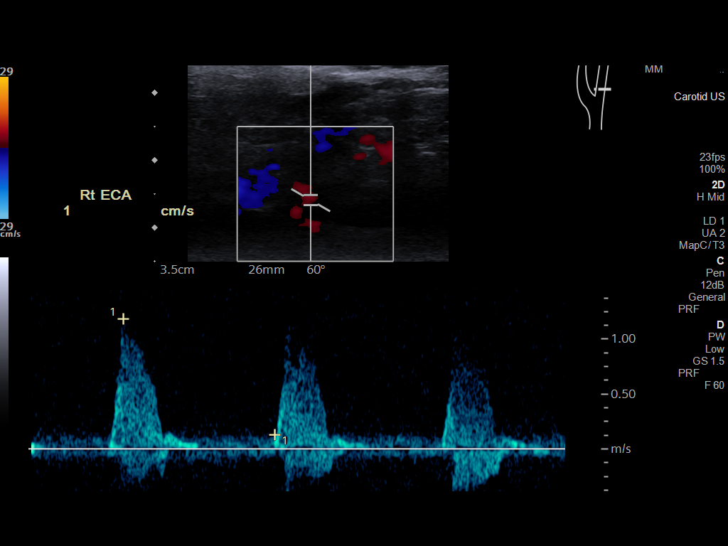
[im 34/77]
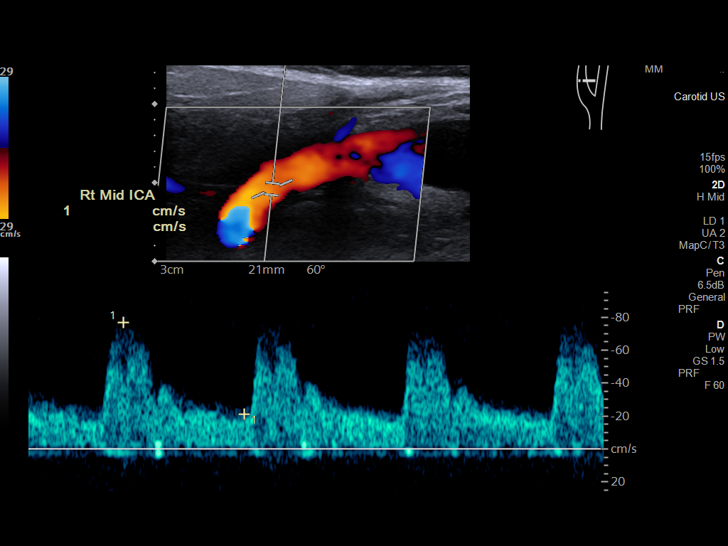
[im 40/77]
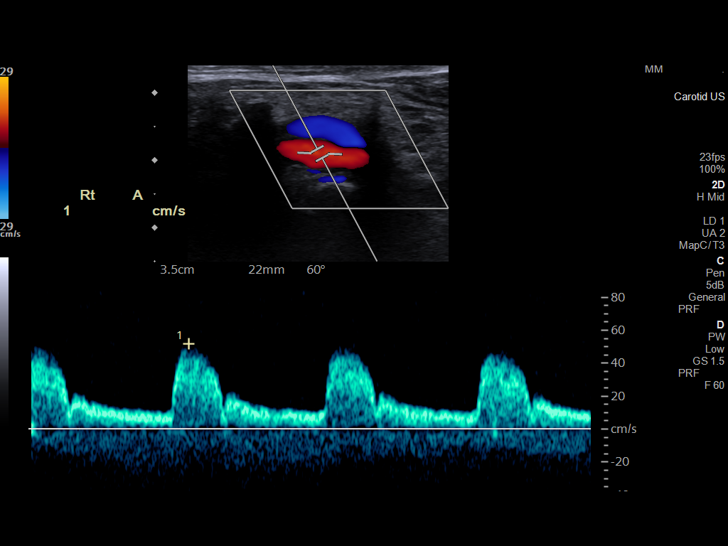
[im 43/77]
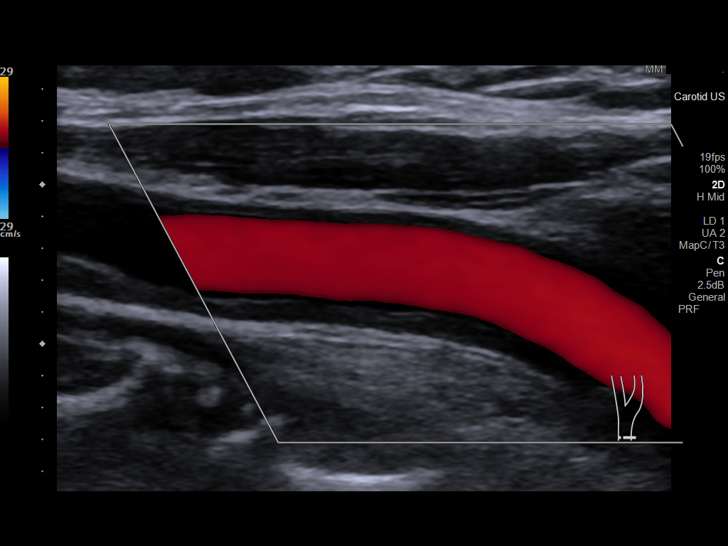
[im 50/77]
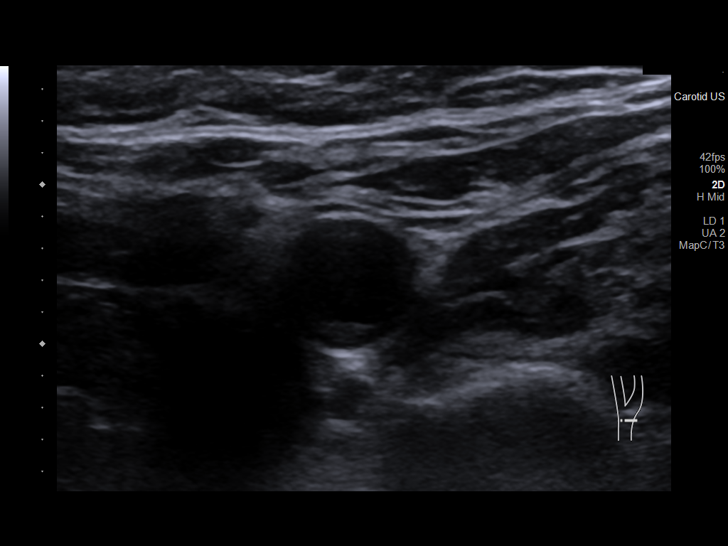
[im 57/77]
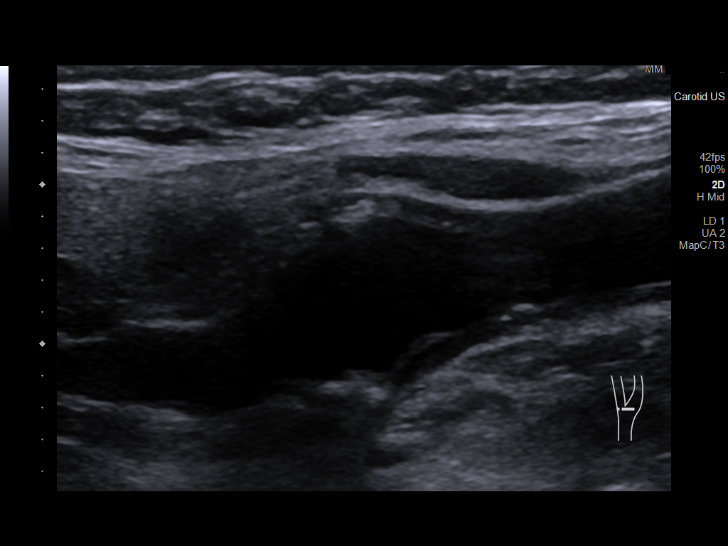
[im 63/77]
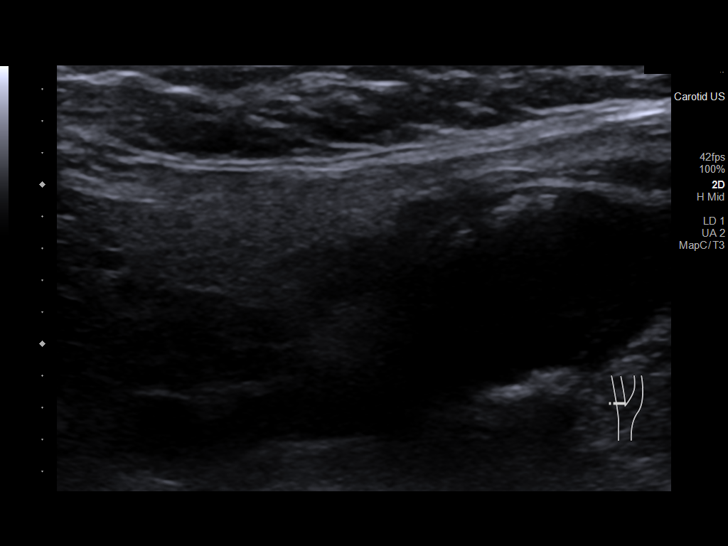
[im 70/77]
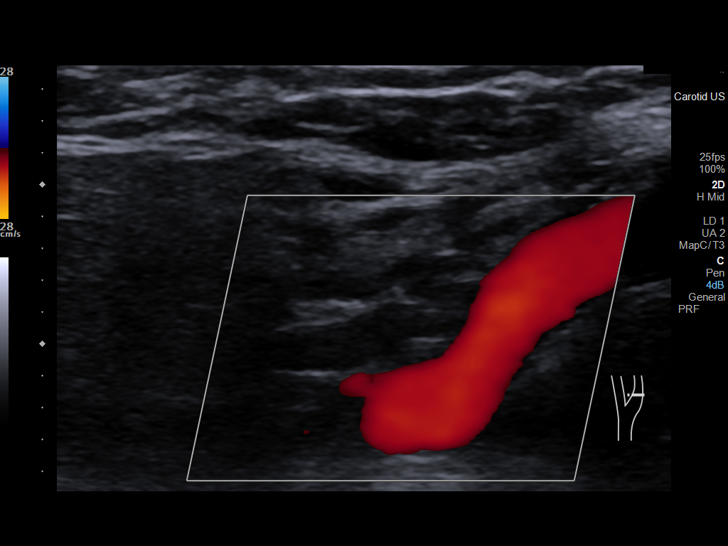
[im 77/77]
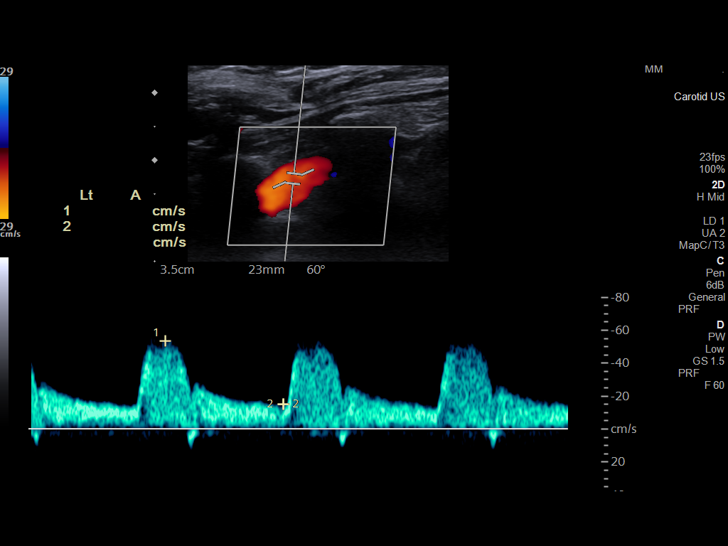

[13 of 24 positions shown; findings below may reference images not displayed]

FINDINGS: Criteria: Quantification of carotid stenosis is based on velocity
parameters that correlate the residual internal carotid diameter
with NASCET-based stenosis levels, using the diameter of the distal
internal carotid lumen as the denominator for stenosis measurement.

The following velocity measurements were obtained:

RIGHT

ICA: 77/21 cm/sec

CCA: 81/17 cm/sec

SYSTOLIC ICA/CCA RATIO:

ECA: 304 cm/sec

LEFT

ICA: 64/20 cm/sec

CCA: 78/9 cm/sec

SYSTOLIC ICA/CCA RATIO:

ECA: 57 cm/sec

RIGHT CAROTID ARTERY: There is a minimal amount of eccentric
echogenic plaque scattered throughout the right common carotid
artery (representative image 8). There is a minimal to moderate
amount of eccentric echogenic plaque within the right carotid bulb
(images 16 and 18). There is a large amount of eccentric echogenic
plaque involving the origin and proximal aspects the right internal
carotid artery (image 29), morphologically resulting in 50% luminal
narrowing though not resulting in elevated peak systolic velocities
within the interrogated course of the right internal carotid artery
to suggest a hemodynamically significant stenosis

RIGHT VERTEBRAL ARTERY:  Antegrade flow

LEFT CAROTID ARTERY: There is a minimal amount of circumferential
intimal thickening throughout the left common carotid artery (images
43, 48 and 52). There is a minimal amount of eccentric echogenic
plaque within the left carotid bulb (images 57 and 58), extending to
involve the origin and proximal aspects of the left internal carotid
artery (image 67), not resulting in elevated peak systolic
velocities within the interrogated course of the left internal
carotid artery to suggest a hemodynamically significant stenosis.

LEFT VERTEBRAL ARTERY:  Antegrade flow
IMPRESSION: 1. Large amount of right-sided atherosclerotic plaque
morphologically results in at least 50% luminal narrowing though
does not result in elevated peak systolic velocities with the right
internal carotid artery to suggest a hemodynamically significant
stenosis. If clinical concern persists, further evaluation with CTA
could as clinically indicated.
2. Minimal amount of left-sided atherosclerotic plaque, not
resulting in a hemodynamically significant stenosis.

## 2020-07-24 MED ORDER — ASPIRIN EC 81 MG PO TBEC: 81.0000 mg | DELAYED_RELEASE_TABLET | Freq: Every day | ORAL | 3 refills | Status: DC

## 2020-07-24 NOTE — Consult Note (Signed)
Patient Demographics:    Kara Woods, is a 86 y.o. female  MRN: [id number]   DOB - 1933/02/28  Admit Date - 07/24/2020  Outpatient Primary MD for the patient is Celene Squibb, MD   Assessment & Plan:    Principal Problem:   TIA (transient ischemic attack)  1)Acute speech disturbance/TIA--- resolved within about 5 to 10 minutes or so -Serial neuro exam reassuring -Telemetry neurology consult depression -CT head, MRI brain and echocardiogram without acute findings -Continue aspirin and Plavix, continue zetia -Patient will need 30-day event monitor to rule out A. Fib -Patient will talk to cardiologist about possible options for statin therapy -Patient is fasting lipid profile  2)H/o CAD/ Prior RCA stent--currently asymptomatic, continue aspirin, Plavix and metoprolol -Continuing Zetia -Recommend statin therapy as above #1  3) history of Lt breast cancer/T2N0 left breast infiltrating lobular carcinoma:--diagnosed in June 2014--status post prior lumpectomy and radiation therapy  4) elevated CEA---  Her CEA was 10.3 on 06/26/2020. -Had colonoscopy in 2007 -Awaiting PET scan and further oncology evaluation  Disposition :- home -1) your testing today does Not reveal any evidence of an acute stroke-- 2) please continue to take aspirin and Plavix to reduce your risk for strokes and heart attacks 3) in the past you have not tolerated Lipitor/atorvastatin--- please talk to your cardiologist about trying other medications from this class (Statins) -- as these medications  (statins) can help to reduce your risk for strokes and heart attacks 4) cardiologist Dr. Shelva Majestic has been notified of the need to arrange--  30 day heart/event monitor to rule out atrial fibrillation which is an irregular heartbeat that can  increase your risk for stroke 5) please keep your appointment for your PET scan and your  appointment with your oncologist Dr. Delton Coombes for next week 6) you are taking aspirin and Plavix which are blood thinners so please Avoid ibuprofen/Advil/Aleve/Motrin/Goody Powders/Naproxen/BC powders/Meloxicam/Diclofenac/Indomethacin and other Nonsteroidal anti-inflammatory medications as these will make you more likely to bleed and can cause stomach ulcers, can also cause Kidney problems.    Disposition:--Plan of care discussed with patient and patient's daughter Carey Bullocks)   With History of - Reviewed by me  Past Medical History:  Diagnosis Date  . Anxiety   . Arthritis   . Blind left eye   . Breast cancer ([place])    left   . Colitis   . Coronary artery disease    a. 10/2016 Staged PCI: RCA 50p, 33m(2.25x12 Resolute Onyx DES), LCX 50p, 876m2.75x23 Xience Alpine DES). Residual LAD 50p/m, 4536m  . CMarland Kitchenstocele 10/11/2013  . Depression   . Diastolic dysfunction    a. 09/2016 Echo: EF 50%, diffuse hypokinesis, mild LVH, grade 1 diastolic dysfunction, mild mitral regurgitation, mildly dilated left atrium.  . GMarland KitchenRD (gastroesophageal reflux disease)    occasional Tums only  . HOH (hard of hearing)   . Pelvic relaxation 10/11/2013  . Proctitis    colonsocopy 2007,  Canasa suppositories  . S/P endoscopy March 2009   mild erosive reflux esophagitis, Schatzki's ring, s/p dilation  . Schatzki's ring   . Shortness of breath    occ if anxiety  . Wears dentures    upper  . Wears glasses       Past Surgical History:  Procedure Laterality Date  . ABDOMINAL HYSTERECTOMY    . ANTERIOR AND POSTERIOR REPAIR N/A 05/17/2014   Procedure: ANTERIOR (CYSTOCELE) AND POSTERIOR REPAIR (RECTOCELE);  Surgeon: Reece Packer, MD;  Location: [facility];  Service: Urology;  Laterality: N/A;  . APPENDECTOMY    . BREAST LUMPECTOMY WITH NEEDLE LOCALIZATION AND AXILLARY SENTINEL LYMPH NODE BX Left 05/03/2013    Procedure: BREAST LUMPECTOMY WITH NEEDLE LOCALIZATION AND AXILLARY SENTINEL LYMPH NODE BX;  Surgeon: Edward Jolly, MD;  Location: [facility];  Service: General;  Laterality: Left;  . BREAST SURGERY Left 05/19/13  . CARDIAC CATHETERIZATION  1998  . CARDIAC CATHETERIZATION N/A 11/04/2016   Procedure: Left Heart Cath and Coronary Angiography;  Surgeon: Troy Sine, MD;  Location: [facility];  Service: Cardiovascular;  Laterality: N/A;  . CARDIAC CATHETERIZATION N/A 11/05/2016   Procedure: Coronary Stent Intervention;  Surgeon: Troy Sine, MD;  Location: [facility];  Service: Cardiovascular;  Laterality: N/A;  . CHOLECYSTECTOMY    . COLONOSCOPY  05/06/2006   Diffuse inflammatory changes of the rectal mucosa, consistent  with proctitis.  Otherwise, normal colon to terminal ileum  . CORONARY ANGIOPLASTY  11/04/2016  . CORONARY STENT PLACEMENT     Drug-eluting coronary artery stent, non-bioabsorbable-polymer-coated  . CYSTOSCOPY N/A 05/17/2014   Procedure: CYSTOSCOPY;  Surgeon: Reece Packer, MD;  Location: [facility];  Service: Urology;  Laterality: N/A;  . ESOPHAGOGASTRODUODENOSCOPY  02/09/2008   Schatzki ring status post dilation/Distal esophageal erosion consistent with mild erosive reflux  esophagitis, otherwise unremarkable esophagus, normal stomach, D1, D2.  . EYE SURGERY     lt cataract-implant  . EYE SURGERY     lt-lens repaced,l  . [place] SURGERY  1993  . RE-EXCISION OF BREAST LUMPECTOMY Left 05/19/2013   Procedure: RE-EXCISION OF BREAST LUMPECTOMY;  Surgeon: Edward Jolly, MD;  Location: [facility];  Service: General;  Laterality: Left;  . RETINAL DETACHMENT SURGERY  1978   Left  . VAGINAL HYSTERECTOMY Bilateral 05/17/2014   Procedure: HYSTERECTOMY VAGINAL with Bilateral Salpingo-Oophorectomy; Bladder cystotomy repair;  Surgeon: Marvene Staff, MD;  Location: [facility];  Service: Gynecology;  Laterality: Bilateral;    . VAGINAL PROLAPSE REPAIR N/A 05/17/2014   Procedure: VAGINAL VAULT PROLAPSE AND GRAFT;  Surgeon: Reece Packer, MD;  Location: [facility];  Service: Urology;  Laterality: N/A;      Chief Complaint  Patient presents with  . Cerebrovascular Accident      HPI:    Kara Woods  is a 86 y.o. female non-smoker with past medical history relevant for left breast cancer status post prior lumpectomy and radiation, CAD status post prior RCA stent, recent elevation of CEA awaiting PET scan and further oncology work-up -With a brief episode of speech disturbance that lasted just about 5 minutes or so -Upon arrival to the ED speech disturbance had resolved -Patient complains of generalized fatigue and weakness patient on the right side for quite a while now at least for a month -No frank chest pains no palpitations no dizziness no syncope -Patient has left eye visual loss which is not new -No pleuritic symptoms no  fever no chills no vomiting no diarrhea no leg swelling, no dyspnea on exertion or shortness of breath  -Additional history obtained from patient's daughter by phone Hoyle Sauer ("Chigger") -- Telemetry neurology input appreciated CT head, MRI brain without contrast and echocardiogram report noted -- Given negative stroke work-up and resolution of speech disturbance and patient back to baseline--- discussed with patient and daughter with discharge from the ED --Always able to reach out to Dr. Shelva Majestic patient's cardiologist who will arrange for 30-day event monitor to rule out possible arrhythmias/A. Fib - --Patient tells me she has been compliant with aspirin and Plavix -She is apparently intolerant to statins but she is taking Zetia -    Review of systems:    In addition to the HPI above,   A full Review of  Systems was done, all other systems reviewed are negative except as noted above in HPI , .    Social History:  Reviewed by me    Social History   Tobacco Use  .  Smoking status: Former Smoker    Packs/day: 1.00    Types: Cigarettes    Quit date: 04/28/1990    Years since quitting: 30.2  . Smokeless tobacco: Never Used  . Tobacco comment: quit 28 yrs ago  Substance Use Topics  . Alcohol use: Yes    Alcohol/week: 1.0 standard drink    Types: 1 Glasses of wine per week    Comment: occ     Family History :  Reviewed by me    Family History  Problem Relation Age of Onset  . Cancer Brother        spinal  . Heart attack Mother   . Heart attack Father   . Heart attack Son   . Heart attack Son   . Depression Son   . Multiple myeloma Daughter   . Anemia Daughter   . Colon cancer Neg Hx      Home Medications:   Prior to Admission medications   Medication Sig Start Date End Date Taking? Authorizing Provider  acetaminophen (TYLENOL) 500 MG tablet Take 500-1,000 mg by mouth every 6 (six) hours as needed for mild pain.    [provider]  aspirin EC 81 MG tablet Take 1 tablet (81 mg total) by mouth daily. 10/31/16   Troy Sine, MD  carboxymethylcellulose (REFRESH PLUS) 0.5 % SOLN Place 3-4 drops into both eyes 3 (three) times daily as needed (dry eyes). Preservative free    [provider]  clopidogrel (PLAVIX) 75 MG tablet Take 75 mg by mouth daily.  12/17/18   [provider]  metoprolol tartrate (LOPRESSOR) 25 MG tablet TAKE 1/2 TABLET BY MOUTH TWICE A DAY 01/26/20   Troy Sine, MD  nitroGLYCERIN (NITROSTAT) 0.4 MG SL tablet Place 1 tablet (0.4 mg total) under the tongue every 5 (five) minutes as needed. 11/06/16   Cheryln Manly, NP  Travoprost, BAK Free, (TRAVATAN) 0.004 % SOLN ophthalmic solution Place 1 drop into both eyes at bedtime.     [provider]     Allergies:     Allergies  Allergen Reactions  . Contrast Media [Iodinated Diagnostic Agents] Anaphylaxis    Adverse reaction; pt hyperventilating, c/o CP and SOB   . Lipitor [Atorvastatin] Other (See Comments)    Muscle aches,  cramps, fatigue, weight loss/poor appetite  . Sulfamethoxazole Other (See Comments)    Reaction was years ago  . Sulfonamide Derivatives Other (See Comments)    Dizziness  Physical Exam:   Vitals  Blood pressure (!) 143/74, pulse 96, temperature 99.4 F (37.4 C), temperature source Oral, resp. rate 18, SpO2 95 %.  Physical Examination: General appearance - alert, well appearing, and in no distress  Mental status - alert, oriented to person, place, and time,  Eyes - sclera anicteric, chronic left eye vision loss Neck - supple, no JVD elevation , Chest - clear  to auscultation bilaterally, symmetrical air movement,  Heart - S1 and S2 normal, regular  Abdomen - soft, nontender, nondistended, no masses or organomegaly Neurological - screening mental status exam normal, neck supple without rigidity, cranial nerves II through XII intact, DTR's normal and symmetric -Patient has generalized weakness, no new focal deficits noted Extremities - no pedal edema noted, intact peripheral pulses  Skin - warm, dry     Data Review:    CBC Recent Labs  Lab 07/24/20 1209 07/24/20 1216  WBC 10.5  --   HGB 13.0 13.9  HCT 41.5 41.0  PLT 226  --   MCV 94.1  --   MCH 29.5  --   MCHC 31.3  --   RDW 13.6  --   LYMPHSABS 2.9  --   MONOABS 0.6  --   EOSABS 0.4  --   BASOSABS 0.1  --    ------------------------------------------------------------------------------------------------------------------  Chemistries  Recent Labs  Lab 07/24/20 1209 07/24/20 1216  NA 137 141  K 4.0 4.2  CL 103 102  CO2 24  --   GLUCOSE 94 90  BUN 20 19  CREATININE 0.74 0.70  CALCIUM 9.0  --   AST 18  --   ALT 16  --   ALKPHOS 68  --   BILITOT 0.6  --    ------------------------------------------------------------------------------------------------------------------ estimated creatinine clearance is 53.3 mL/min (by C-G formula based on SCr of 0.7  mg/dL). ------------------------------------------------------------------------------------------------------------------ No results for input(s): TSH, T4TOTAL, T3FREE, THYROIDAB in the last 72 hours.  Invalid input(s): FREET3   Coagulation profile Recent Labs  Lab 07/24/20 1209  INR 1.0   ------------------------------------------------------------------------------------------------------------------- No results for input(s): DDIMER in the last 72 hours. -------------------------------------------------------------------------------------------------------------------  Cardiac Enzymes No results for input(s): CKMB, TROPONINI, MYOGLOBIN in the last 168 hours.  Invalid input(s): CK ------------------------------------------------------------------------------------------------------------------    Component Value Date/Time   BNP 51.0 09/01/2016 1012     ---------------------------------------------------------------------------------------------------------------  Urinalysis    Component Value Date/Time   COLORURINE YELLOW 06/24/2017 1001   APPEARANCEUR HAZY (A) 06/24/2017 1001   LABSPEC 1.011 06/24/2017 1001   PHURINE 7.0 06/24/2017 1001   GLUCOSEU NEGATIVE 06/24/2017 1001   HGBUR NEGATIVE 06/24/2017 1001   BILIRUBINUR NEGATIVE 06/24/2017 1001   KETONESUR NEGATIVE 06/24/2017 1001   PROTEINUR NEGATIVE 06/24/2017 1001   UROBILINOGEN 0.2 05/28/2014 1147   NITRITE NEGATIVE 06/24/2017 1001   LEUKOCYTESUR TRACE (A) 06/24/2017 1001    ----------------------------------------------------------------------------------------------------------------   Imaging Results:    MR BRAIN WO CONTRAST  Result Date: 07/24/2020 CLINICAL DATA:  Neuro deficit, acute, stroke suspected. EXAM: MRI HEAD WITHOUT CONTRAST TECHNIQUE: Multiplanar, multiecho pulse sequences of the brain and surrounding structures were obtained without intravenous contrast. COMPARISON:  Noncontrast head CT  performed earlier the same day 07/24/2020. Prior head CT examination 12/10/2018 and earlier. FINDINGS: Brain: Mild intermittent motion degradation. Mild generalized parenchymal atrophy. Moderate scattered T2/FLAIR hyperintensity within the cerebral white matter is nonspecific, but consistent with chronic small vessel ischemic disease. Small chronic infarcts within the cerebellar hemispheres bilaterally. There is no acute infarct. No evidence of intracranial mass. No chronic intracranial blood  products. No extra-axial fluid collection. No midline shift. Vascular: Expected proximal arterial flow voids. Skull and upper cervical spine: No focal marrow lesion. Sinuses/Orbits: Left scleral buckle. No acute orbital abnormality. No significant paranasal sinus disease or mastoid effusion. IMPRESSION: Mildly motion degraded examination. No evidence of acute intracranial abnormality, including acute infarction. Moderate chronic small vessel ischemic changes within cerebral white matter. 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 Mild generalized parenchymal atrophy. Electronically Signed   By: Kellie Simmering DO   On: 07/24/2020 15:12   ECHOCARDIOGRAM COMPLETE  Result Date: 07/24/2020    ECHOCARDIOGRAM REPORT   Patient Name:   ASHAUNTI TREPTOW Date of Exam: 07/24/2020 Medical Rec #:  [id number]        Height:       66.0 in Accession #:    [id number]       Weight:       166.0 lb Date of Birth:  05/28/1933        BSA:          1.848 m Patient Age:    72 years         BP:           130/100 mmHg Patient Gender: F                HR:           74 bpm. Exam Location:  [facility] Procedure: 2D Echo, Cardiac Doppler and Color Doppler Indications:    TIA 435.9 / G45.9  History:        Patient has prior history of Echocardiogram examinations, most                 recent 04/03/2018. CAD; Risk Factors:Hypertension and                 Dyslipidemia. Cancer left breast.  Sonographer:    Alvino Chapel RCS Referring Phys:  [id number] Neftaly Swiss IMPRESSIONS  1. Left ventricular ejection fraction, by estimation, is 60 to 65%. The left ventricle has normal function. The left ventricle has no regional wall motion abnormalities. Left ventricular diastolic parameters are consistent with Grade I diastolic dysfunction (impaired relaxation).  2. Right ventricular systolic function is normal. The right ventricular size is normal. There is normal pulmonary artery systolic pressure.  3. Left atrial size was mildly dilated.  4. The mitral valve is normal in structure. Trivial mitral valve regurgitation. No evidence of mitral stenosis.  5. The aortic valve is tricuspid. Aortic valve regurgitation is not visualized. No aortic stenosis is present.  6. The inferior vena cava is normal in size with greater than 50% respiratory variability, suggesting right atrial pressure of 3 mmHg. FINDINGS  Left Ventricle: Left ventricular ejection fraction, by estimation, is 60 to 65%. The left ventricle has normal function. The left ventricle has no regional wall motion abnormalities. The left ventricular internal cavity size was normal in size. There is  no left ventricular hypertrophy. Left ventricular diastolic parameters are consistent with Grade I diastolic dysfunction (impaired relaxation). Normal left ventricular filling pressure. Right Ventricle: The right ventricular size is normal. No increase in right ventricular wall thickness. Right ventricular systolic function is normal. There is normal pulmonary artery systolic pressure. The tricuspid regurgitant velocity is 2.26 m/s, and  with an assumed right atrial pressure of 3 mmHg, the estimated right ventricular systolic pressure is 09.9 mmHg. Left Atrium: Left atrial size was mildly dilated. Right Atrium: Right atrial size was normal in size. Pericardium:  There is no evidence of pericardial effusion. Mitral Valve: The mitral valve is normal in structure. Trivial mitral valve regurgitation. No evidence of  mitral valve stenosis. Tricuspid Valve: The tricuspid valve is normal in structure. Tricuspid valve regurgitation is mild . No evidence of tricuspid stenosis. Aortic Valve: The aortic valve is tricuspid. Aortic valve regurgitation is not visualized. No aortic stenosis is present. Aortic valve mean gradient measures 3.1 mmHg. Aortic valve peak gradient measures 5.6 mmHg. Aortic valve area, by VTI measures 1.93 cm. Pulmonic Valve: The pulmonic valve was not well visualized. Pulmonic valve regurgitation is not visualized. No evidence of pulmonic stenosis. Aorta: The aortic root is normal in size and structure. Venous: The inferior vena cava is normal in size with greater than 50% respiratory variability, suggesting right atrial pressure of 3 mmHg. IAS/Shunts: No atrial level shunt detected by color flow Doppler.  LEFT VENTRICLE PLAX 2D LVIDd:         4.49 cm  Diastology LVIDs:         3.10 cm  LV e' lateral:   5.55 cm/s LV PW:         0.93 cm  LV E/e' lateral: 9.7 LV IVS:        0.95 cm  LV e' medial:    3.70 cm/s LVOT diam:     2.00 cm  LV E/e' medial:  14.5 LV SV:         51 LV SV Index:   28 LVOT Area:     3.14 cm  RIGHT VENTRICLE RV S prime:     11.60 cm/s TAPSE (M-mode): 2.0 cm LEFT ATRIUM             Index       RIGHT ATRIUM           Index LA diam:        3.40 cm 1.84 cm/m  RA Area:     12.60 cm LA Vol (A2C):   53.4 ml 28.90 ml/m RA Volume:   25.70 ml  13.91 ml/m LA Vol (A4C):   53.9 ml 29.17 ml/m LA Biplane Vol: 53.6 ml 29.01 ml/m  AORTIC VALVE AV Area (Vmax):    1.62 cm AV Area (Vmean):   1.71 cm AV Area (VTI):     1.93 cm AV Vmax:           118.71 cm/s AV Vmean:          84.748 cm/s AV VTI:            0.266 m AV Peak Grad:      5.6 mmHg AV Mean Grad:      3.1 mmHg LVOT Vmax:         61.40 cm/s LVOT Vmean:        46.000 cm/s LVOT VTI:          0.163 m LVOT/AV VTI ratio: 0.61  AORTA Ao Root diam: 3.30 cm MITRAL VALVE               TRICUSPID VALVE MV Area (PHT): 2.44 cm    TR Peak grad:   20.4 mmHg  MV Decel Time: 311 msec    TR Vmax:        226.00 cm/s MV E velocity: 53.80 cm/s MV A velocity: 82.30 cm/s  SHUNTS MV E/A ratio:  0.65        Systemic VTI:  0.16 m  Systemic Diam: 2.00 cm Carlyle Dolly MD Electronically signed by Carlyle Dolly MD Signature Date/Time: 07/24/2020/4:11:09 PM    Final    CT HEAD CODE STROKE WO CONTRAST  Result Date: 07/24/2020 CLINICAL DATA:  Code stroke.  Abnormal speech, right-sided weakness EXAM: CT HEAD WITHOUT CONTRAST TECHNIQUE: Contiguous axial images were obtained from the base of the skull through the vertex without intravenous contrast. COMPARISON:  12/10/2018 FINDINGS: Brain: There is no acute intracranial hemorrhage, mass effect, or edema. No new loss of gray-white differentiation. Patchy areas of hypoattenuation in the supratentorial white matter probably reflects stable chronic microvascular ischemic changes. Prominence of the ventricles and sulci reflects minor generalized parenchymal volume loss. There is no extra-axial fluid collection. Vascular: No hyperdense vessel. Intracranial atherosclerotic calcification is present at the skull base. Skull: Unremarkable. Sinuses/Orbits: No acute abnormality. Other: Mastoid air cells are clear. ASPECTS ([place] Stroke Program Early CT Score) - Ganglionic level infarction (caudate, lentiform nuclei, internal capsule, insula, M1-M3 cortex): 7 - Supraganglionic infarction (M4-M6 cortex): 3 Total score (0-10 with 10 being normal): 10 IMPRESSION: No acute intracranial hemorrhage or evidence of acute infarction. ASPECT score is 10. Chronic microvascular ischemic changes. These results were called by telephone at the time of interpretation on 07/24/2020 at 12:18 pm to provider JOSHUA LONG , who verbally acknowledged these results. Electronically Signed   By: Macy Mis M.D.   On: 07/24/2020 12:21    Radiological Exams on Admission: MR BRAIN WO CONTRAST  Result Date: 07/24/2020 CLINICAL DATA:   Neuro deficit, acute, stroke suspected. EXAM: MRI HEAD WITHOUT CONTRAST TECHNIQUE: Multiplanar, multiecho pulse sequences of the brain and surrounding structures were obtained without intravenous contrast. COMPARISON:  Noncontrast head CT performed earlier the same day 07/24/2020. Prior head CT examination 12/10/2018 and earlier. FINDINGS: Brain: Mild intermittent motion degradation. Mild generalized parenchymal atrophy. Moderate scattered T2/FLAIR hyperintensity within the cerebral white matter is nonspecific, but consistent with chronic small vessel ischemic disease. Small chronic infarcts within the cerebellar hemispheres bilaterally. There is no acute infarct. No evidence of intracranial mass. No chronic intracranial blood products. No extra-axial fluid collection. No midline shift. Vascular: Expected proximal arterial flow voids. Skull and upper cervical spine: No focal marrow lesion. Sinuses/Orbits: Left scleral buckle. No acute orbital abnormality. No significant paranasal sinus disease or mastoid effusion. IMPRESSION: Mildly motion degraded examination. No evidence of acute intracranial abnormality, including acute infarction. Moderate chronic small vessel ischemic changes within cerebral white matter. 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 Mild generalized parenchymal atrophy. Electronically Signed   By: Kellie Simmering DO   On: 07/24/2020 15:12   ECHOCARDIOGRAM COMPLETE  Result Date: 07/24/2020    ECHOCARDIOGRAM REPORT   Patient Name:   KELLY RANIERI Date of Exam: 07/24/2020 Medical Rec #:  [id number]        Height:       66.0 in Accession #:    [id number]       Weight:       166.0 lb Date of Birth:  December 09, 1932        BSA:          1.848 m Patient Age:    37 years         BP:           130/100 mmHg Patient Gender: F                HR:           74 bpm. Exam Location:  [facility] Procedure: 2D  Echo, Cardiac Doppler and Color Doppler Indications:    TIA 435.9 / G45.9  History:         Patient has prior history of Echocardiogram examinations, most                 recent 04/03/2018. CAD; Risk Factors:Hypertension and                 Dyslipidemia. Cancer left breast.  Sonographer:    Alvino Chapel RCS Referring Phys: [id number] Edelmira Gallogly IMPRESSIONS  1. Left ventricular ejection fraction, by estimation, is 60 to 65%. The left ventricle has normal function. The left ventricle has no regional wall motion abnormalities. Left ventricular diastolic parameters are consistent with Grade I diastolic dysfunction (impaired relaxation).  2. Right ventricular systolic function is normal. The right ventricular size is normal. There is normal pulmonary artery systolic pressure.  3. Left atrial size was mildly dilated.  4. The mitral valve is normal in structure. Trivial mitral valve regurgitation. No evidence of mitral stenosis.  5. The aortic valve is tricuspid. Aortic valve regurgitation is not visualized. No aortic stenosis is present.  6. The inferior vena cava is normal in size with greater than 50% respiratory variability, suggesting right atrial pressure of 3 mmHg. FINDINGS  Left Ventricle: Left ventricular ejection fraction, by estimation, is 60 to 65%. The left ventricle has normal function. The left ventricle has no regional wall motion abnormalities. The left ventricular internal cavity size was normal in size. There is  no left ventricular hypertrophy. Left ventricular diastolic parameters are consistent with Grade I diastolic dysfunction (impaired relaxation). Normal left ventricular filling pressure. Right Ventricle: The right ventricular size is normal. No increase in right ventricular wall thickness. Right ventricular systolic function is normal. There is normal pulmonary artery systolic pressure. The tricuspid regurgitant velocity is 2.26 m/s, and  with an assumed right atrial pressure of 3 mmHg, the estimated right ventricular systolic pressure is 41.9 mmHg. Left Atrium: Left atrial size was  mildly dilated. Right Atrium: Right atrial size was normal in size. Pericardium: There is no evidence of pericardial effusion. Mitral Valve: The mitral valve is normal in structure. Trivial mitral valve regurgitation. No evidence of mitral valve stenosis. Tricuspid Valve: The tricuspid valve is normal in structure. Tricuspid valve regurgitation is mild . No evidence of tricuspid stenosis. Aortic Valve: The aortic valve is tricuspid. Aortic valve regurgitation is not visualized. No aortic stenosis is present. Aortic valve mean gradient measures 3.1 mmHg. Aortic valve peak gradient measures 5.6 mmHg. Aortic valve area, by VTI measures 1.93 cm. Pulmonic Valve: The pulmonic valve was not well visualized. Pulmonic valve regurgitation is not visualized. No evidence of pulmonic stenosis. Aorta: The aortic root is normal in size and structure. Venous: The inferior vena cava is normal in size with greater than 50% respiratory variability, suggesting right atrial pressure of 3 mmHg. IAS/Shunts: No atrial level shunt detected by color flow Doppler.  LEFT VENTRICLE PLAX 2D LVIDd:         4.49 cm  Diastology LVIDs:         3.10 cm  LV e' lateral:   5.55 cm/s LV PW:         0.93 cm  LV E/e' lateral: 9.7 LV IVS:        0.95 cm  LV e' medial:    3.70 cm/s LVOT diam:     2.00 cm  LV E/e' medial:  14.5 LV SV:  51 LV SV Index:   28 LVOT Area:     3.14 cm  RIGHT VENTRICLE RV S prime:     11.60 cm/s TAPSE (M-mode): 2.0 cm LEFT ATRIUM             Index       RIGHT ATRIUM           Index LA diam:        3.40 cm 1.84 cm/m  RA Area:     12.60 cm LA Vol (A2C):   53.4 ml 28.90 ml/m RA Volume:   25.70 ml  13.91 ml/m LA Vol (A4C):   53.9 ml 29.17 ml/m LA Biplane Vol: 53.6 ml 29.01 ml/m  AORTIC VALVE AV Area (Vmax):    1.62 cm AV Area (Vmean):   1.71 cm AV Area (VTI):     1.93 cm AV Vmax:           118.71 cm/s AV Vmean:          84.748 cm/s AV VTI:            0.266 m AV Peak Grad:      5.6 mmHg AV Mean Grad:      3.1 mmHg LVOT  Vmax:         61.40 cm/s LVOT Vmean:        46.000 cm/s LVOT VTI:          0.163 m LVOT/AV VTI ratio: 0.61  AORTA Ao Root diam: 3.30 cm MITRAL VALVE               TRICUSPID VALVE MV Area (PHT): 2.44 cm    TR Peak grad:   20.4 mmHg MV Decel Time: 311 msec    TR Vmax:        226.00 cm/s MV E velocity: 53.80 cm/s MV A velocity: 82.30 cm/s  SHUNTS MV E/A ratio:  0.65        Systemic VTI:  0.16 m                            Systemic Diam: 2.00 cm Carlyle Dolly MD Electronically signed by Carlyle Dolly MD Signature Date/Time: 07/24/2020/4:11:09 PM    Final    CT HEAD CODE STROKE WO CONTRAST  Result Date: 07/24/2020 CLINICAL DATA:  Code stroke.  Abnormal speech, right-sided weakness EXAM: CT HEAD WITHOUT CONTRAST TECHNIQUE: Contiguous axial images were obtained from the base of the skull through the vertex without intravenous contrast. COMPARISON:  12/10/2018 FINDINGS: Brain: There is no acute intracranial hemorrhage, mass effect, or edema. No new loss of gray-white differentiation. Patchy areas of hypoattenuation in the supratentorial white matter probably reflects stable chronic microvascular ischemic changes. Prominence of the ventricles and sulci reflects minor generalized parenchymal volume loss. There is no extra-axial fluid collection. Vascular: No hyperdense vessel. Intracranial atherosclerotic calcification is present at the skull base. Skull: Unremarkable. Sinuses/Orbits: No acute abnormality. Other: Mastoid air cells are clear. ASPECTS ([place] Stroke Program Early CT Score) - Ganglionic level infarction (caudate, lentiform nuclei, internal capsule, insula, M1-M3 cortex): 7 - Supraganglionic infarction (M4-M6 cortex): 3 Total score (0-10 with 10 being normal): 10 IMPRESSION: No acute intracranial hemorrhage or evidence of acute infarction. ASPECT score is 10. Chronic microvascular ischemic changes. These results were called by telephone at the time of interpretation on 07/24/2020 at 12:18 pm to provider  JOSHUA LONG , who verbally acknowledged these results. Electronically Signed   By: Macy Mis  M.D.   On: 07/24/2020 12:21    Family Communication: Admission, patients condition and plan of care including tests being ordered have been discussed with the patient and daughter Hoyle Sauer who indicate understanding and agree with the plan   Code Status - Full Code  Likely DC to  Home   Condition   stable  Roxan Hockey M.D on 07/24/2020 at 6:27 PM Go to [email or web address] -  for contact info  [facility] - Office  [phone]

## 2020-07-24 NOTE — Consult Note (Signed)
[facility]rology Consult Services   Date of Service:   07/24/2020 12:04:58  Impression:     .  G45.9 - Transient cerebral ischemic attack, unspecified  Comments/Sign-Out: Patient with history of Hyperlipidemia/Hypercholesterolemia , hypertension, past breast cancer but also currently being worked up for , Coronary Artery Disease, blindness left eye, right leg weakness for about a month. Presents after having transient speech impairment. Head CT: No acute Intracranial hemorrhage. NIHSS 2 (right leg weakness but going on for about a month) Etiology for Symptoms/presentation is concerning for TIA, also given history of breast cancer and current ongoing workup cerebral metastasis should be considered as well. Speech impairment was transient and also there is a concern that right leg weakness since about a month ago could be due to a stroke therefore not a candidate for IV Thrombolysis. Symptoms not suggestive of Large Vessel Occlusion. Will be admitted for stroke workup.  Metrics: Last Known Well: 07/24/2020 09:30:00 [facility] Notification Time: 07/24/2020 12:04:58 Arrival Time: 07/24/2020 11:58:00 Stamp Time: 07/24/2020 12:04:58 Time First Login Attempt: 07/24/2020 12:10:25 Symptoms: transient speech impairment. NIHSS Start Assessment Time: 07/24/2020 12:14:51 Patient is not a candidate for Thrombolytic. Thrombolytic Medical Decision: 07/24/2020 12:17:06 Patient was not deemed candidate for Thrombolytic because of following reasons: Resolved symptoms (no residual disabling symptoms).  CT head was reviewed.  ED Physician notified of diagnostic impression and management plan on 07/24/2020 12:28:34  Advanced Imaging: Advanced Imaging Not Recommended because:  Clinical Presentation is not Suggestive of LVO and NIHSS is <6   Our recommendations are outlined below.  Recommendations:     .  Activate Stroke Protocol Admission/Order Set     .   Stroke/Telemetry Floor     .  Neuro Checks     .  Bedside Swallow Eval     .  DVT Prophylaxis     .  IV Fluids, Normal Saline     .  Head of Bed 30 Degrees     .  Euglycemia and Avoid Hyperthermia (PRN Acetaminophen)     .  Antiplatelet Therapy Recommended     .  has taken aspirin 81mg  today, can given additional dose to an accumulative dose of 325mg   Routine Consultation with [facility] for Follow up Care  Sign Out:     .  Discussed with Emergency Department Provider    ------------------------------------------------------------------------------  History of Present Illness: Patient is a 86 year old Female.  Patient was brought by EMS for symptoms of transient speech impairment.  [facility] TeleStroke Consult Note Patient with history of Hyperlipidemia/Hypercholesterolemia , hypertension, past breast cancer but also currently being worked up for , Coronary Artery Disease, blindness left eye, right leg weakness for about a month. last known well per patient and Presenting symptoms/Chief complaint/reason for consult: 09:30 Speech was OK, shortly after noticed word finding difficulty which seems to have resolved. In the morning when she woke up had generalized weakness. Also has generalized pain. Anticoagulation or Antiplatelet use: aspirin, plavix Chart reviewed for Pertinent medical, surgical, family history. Chart reviewed for Medications list and allergies. Pertinent positive and negative review of systems noted in History of Present Illness. When possible Patient/Family Consented to Telemedicine evaluation. When applicable I personally reviewed images of Head CT or MRI.   Past Medical History:     . Hypertension     . Hyperlipidemia     . Coronary Artery Disease     . breast cancer   Antiplatelet use: Aspirin, plavix    Examination: BP(138/100), Pulse(74), Blood  Glucose(75) 1A: Level of Consciousness - Alert; keenly responsive + 0 1B: Ask Month and Age - Both  Questions Right + 0 1C: Blink Eyes & Squeeze Hands - Performs Both Tasks + 0 2: Test Horizontal Extraocular Movements - Normal + 0 3: Test Visual Fields - No Visual Loss + 0 4: Test Facial Palsy (Use Grimace if Obtunded) - Normal symmetry + 0 5A: Test Left Arm Motor Drift - No Drift for 10 Seconds + 0 5B: Test Right Arm Motor Drift - No Drift for 10 Seconds + 0 6A: Test Left Leg Motor Drift - No Drift for 5 Seconds + 0 6B: Test Right Leg Motor Drift - Drift, hits bed + 2 7: Test Limb Ataxia (FNF/Heel-Shin) - No Ataxia + 0 8: Test Sensation - Normal; No sensory loss + 0 9: Test Language/Aphasia - Normal; No aphasia + 0 10: Test Dysarthria - Normal + 0 11: Test Extinction/Inattention - No abnormality + 0  NIHSS Score: 2  Pre-Morbid Modified Rankin Scale: 0 Points = No symptoms at all   Patient/Family was informed the Neurology Consult would occur via TeleHealth consult by way of interactive audio and video telecommunications and consented to receiving care in this manner.   Patient is being evaluated for possible acute neurologic impairment and high probability of imminent or life-threatening deterioration. I spent total of 25 minutes providing care to this patient, including time for face to face visit via telemedicine, review of medical records, imaging studies and discussion of findings with providers, the patient and/or family.   Dr Elenor Quinones   [facility] [phone]  Case [id number]

## 2020-07-24 NOTE — ED Notes (Signed)
Pt's daughter Armida Sans, [phone]

## 2020-07-24 NOTE — ED Triage Notes (Signed)
EMS reports Pt's woke up feeling ok this morning but shakey and weak all over.  Called niece at 56 and was feeling like herself.  Reports around 1100 she was talking to the lawn guy and was unable to get her words out.  EMS felt like pt had some R sided weakness  When they arrived but has improved enroute.

## 2020-07-24 NOTE — Progress Notes (Signed)
*  PRELIMINARY RESULTS* Echocardiogram 2D Echocardiogram has been performed.  Kara Woods 07/24/2020, 4:03 PM

## 2020-07-24 NOTE — Discharge Instructions (Signed)
Transient Ischemic Attack  A transient ischemic attack (TIA) is a "warning stroke" that causes stroke-like symptoms that go away quickly. A TIA does not cause lasting damage to the brain. But having a TIA is a sign that you may be at risk for a stroke. Lifestyle changes and medical treatments can help prevent a stroke. It is important to know the symptoms of a TIA and what to do. Get help right away, even if your symptoms go away. The symptoms of a TIA are the same as those of a stroke. They can happen fast, and they usually go away within minutes or hours. They can include:  Weakness or loss of feeling in your face, arm, or leg. This often happens on one side of your body.  Trouble walking.  Trouble moving your arms or legs.  Trouble talking or understanding what people are saying.  Trouble seeing.  Seeing two of one object (double vision).  Feeling dizzy.  Feeling confused.  Loss of balance or coordination.  Feeling sick to your stomach (nauseous) and throwing up (vomiting).  A very bad headache for no reason. What increases the risk? Certain things may make you more likely to have a TIA. Some of these are things that you can change, such as:  Being very overweight (obese).  Using products that contain nicotine or tobacco, such as cigarettes and e-cigarettes.  Taking birth control pills.  Not being active.  Drinking too much alcohol.  Using drugs. Other risk factors include:  Having an irregular heartbeat (atrial fibrillation).  Being African American or Hispanic.  Having had blood clots, stroke, TIA, or heart attack in the past.  Being a woman with a history of high blood pressure in pregnancy (preeclampsia).  Being over the age of 60.  Being female.  Having family history of stroke.  Having the following diseases or conditions: ? High blood pressure. ? High cholesterol. ? Diabetes. ? Heart disease. ? Sickle cell disease. ? Sleep apnea. ? Migraine  headache. ? Long-term (chronic) diseases that cause soreness and swelling (inflammation). ? Disorders that affect how your blood clots. Follow these instructions at home: Medicines   Take over-the-counter and prescription medicines only as told by your doctor.  If you were told to take aspirin or another medicine to thin your blood, take it exactly as told by your doctor. ? Taking too much of the medicine can cause bleeding. ? Taking too little of the medicine may not work to treat the problem. Eating and drinking   Eat 5 or more servings of fruits and vegetables each day.  Follow instructions from your doctor about your diet. You may need to follow a certain diet to help lower your risk of having a stroke. You may need to: ? Eat a diet that is low in fat and salt. ? Eat foods that contain a lot of fiber. ? Limit the amount of carbohydrates and sugar in your diet.  Limit alcohol intake to 1 drink a day for nonpregnant women and 2 drinks a day for men. One drink equals 12 oz of beer, 5 oz of wine, or 1 oz of hard liquor. General instructions  Keep a healthy weight.  Stay active. Try to get at least 30 minutes of activity on all or most days.  Find out if you have a condition called sleep apnea. Get treatment if needed.  Do not use any products that contain nicotine or tobacco, such as cigarettes and e-cigarettes. If you need help quitting,   ask your doctor.  Do not abuse drugs.  Keep all follow-up visits as told by your doctor. This is important. Get help right away if:  You have any signs of stroke. "BE FAST" is an easy way to remember the main warning signs: ? B - Balance. Signs are dizziness, sudden trouble walking, or loss of balance. ? E - Eyes. Signs are trouble seeing or a sudden change in how you see. ? F - Face. Signs are sudden weakness or loss of feeling of the face, or the face or eyelid drooping on one side. ? A - Arms. Signs are weakness or loss of feeling in an  arm. This happens suddenly and usually on one side of the body. ? S - Speech. Signs are sudden trouble speaking, slurred speech, or trouble understanding what people say. ? T - Time. Time to call emergency services. Write down what time symptoms started.  You have other signs of stroke, such as: ? A sudden, very bad headache with no known cause. ? Feeling sick to your stomach (nausea). ? Throwing up (vomiting). ? Jerky movements that you cannot control (seizure). These symptoms may be an emergency. Do not wait to see if the symptoms will go away. Get medical help right away. Call your local emergency services (911 in the [place] Do not drive yourself to the hospital. Summary  A transient ischemic attack (TIA) is a "warning stroke" that causes stroke-like symptoms that go away quickly.  A TIA is a medical emergency. Get help right away, even if your symptoms go away.  A TIA does not cause lasting damage to the brain.  Having a TIA is a sign that you may be at risk for a stroke. Lifestyle changes and medical treatments can help prevent a stroke. This information is not intended to replace advice given to you by your health care provider. Make sure you discuss any questions you have with your health care provider. Document Revised: 08/07/2018 Document Reviewed: 02/12/2017 [employer] Patient Education  2020 [employer]  

## 2020-07-24 NOTE — ED Notes (Signed)
No TPA at this time

## 2020-07-24 NOTE — ED Provider Notes (Signed)
Emergency Department Provider Note   I have reviewed the triage vital signs and the nursing notes.   HISTORY  Chief Complaint Cerebrovascular Accident   HPI Kara Woods is a 86 y.o. female with past medical history reviewed below arrives to the emergency department as a code stroke.  Patient states that she woke normal this morning but at some point developed weakness in the right arm and leg along with speech disturbance.  She can recall feeling normal at 9:30 AM when she was on the phone with her niece.  She states that symptoms began sometime after that.  She spoke with a lawn care person on the phone sometime after 11 AM and felt like she could not get her words out properly.  She called a family member for help who also noticed that she sounded very different than her normal self and EMS was ultimately called.  They arrived on scene to find the patient weak in the right with significant speech difficulty.  They state that in route symptoms have improved significantly.   Patient states that she was feeling somewhat shaky and generally weak when she first woke up but that the above symptoms began suddenly sometime after 9:30 AM. No radiation of symptoms or modifying factors.    Past Medical History:  Diagnosis Date  . Anxiety   . Arthritis   . Blind left eye   . Breast cancer ([place])    left   . Colitis   . Coronary artery disease    a. 10/2016 Staged PCI: RCA 50p, 29m(2.25x12 Resolute Onyx DES), LCX 50p, 872m2.75x23 Xience Alpine DES). Residual LAD 50p/m, 4512m  . CMarland Kitchenstocele 10/11/2013  . Depression   . Diastolic dysfunction    a. 09/2016 Echo: EF 50%, diffuse hypokinesis, mild LVH, grade 1 diastolic dysfunction, mild mitral regurgitation, mildly dilated left atrium.  . GMarland KitchenRD (gastroesophageal reflux disease)    occasional Tums only  . HOH (hard of hearing)   . Pelvic relaxation 10/11/2013  . Proctitis    colonsocopy 2007, Canasa suppositories  . S/P endoscopy March  2009   mild erosive reflux esophagitis, Schatzki's ring, s/p dilation  . Schatzki's ring   . Shortness of breath    occ if anxiety  . Wears dentures    upper  . Wears glasses     Patient Active Problem List   Diagnosis Date Noted  . TIA (transient ischemic attack) 07/24/2020  . Dizziness 11/19/2016  . CAD (coronary artery disease) 11/06/2016  . Essential hypertension 11/06/2016  . Unstable angina ([facility]/09/2016  . S/P vaginal hysterectomy 05/17/2014  . Cystocele 10/11/2013  . Pelvic relaxation 10/11/2013  . Cancer of left breast ([facility]5/27/2014  . After cataract, bilateral 04/07/2013  . Corneal edema 04/07/2013  . Dry eyes 04/07/2013  . Diarrhea 06/23/2012  . Schatzki's ring 07/16/2011  . COLLES' FRACTURE, LEFT 04/04/2010  . CHEST PAIN UNSPECIFIED 09/18/2009  . HYPERLIPIDEMIA 07/28/2008  . ANXIETY 07/28/2008  . MACULAR DEGENERATION, BILATERAL 07/28/2008  . CAD 07/28/2008  . GERD 07/28/2008  . PROCTITIS 07/28/2008  . OSTEOPENIA 07/28/2008  . RETINAL DETACHMENT, LEFT EYE, HX OF 07/28/2008    Past Surgical History:  Procedure Laterality Date  . ABDOMINAL HYSTERECTOMY    . ANTERIOR AND POSTERIOR REPAIR N/A 05/17/2014   Procedure: ANTERIOR (CYSTOCELE) AND POSTERIOR REPAIR (RECTOCELE);  Surgeon: ScoReece PackerD;  [facility];  Service: Urology;  Laterality: N/A;  . APPENDECTOMY    . BREAST LUMPECTOMY WITH NEEDLE  LOCALIZATION AND AXILLARY SENTINEL LYMPH NODE BX Left 05/03/2013   Procedure: BREAST LUMPECTOMY WITH NEEDLE LOCALIZATION AND AXILLARY SENTINEL LYMPH NODE BX;  Surgeon: Edward Jolly, MD;  Location: [facility];  Service: General;  Laterality: Left;  . BREAST SURGERY Left 05/19/13  . CARDIAC CATHETERIZATION  1998  . CARDIAC CATHETERIZATION N/A 11/04/2016   Procedure: Left Heart Cath and Coronary Angiography;  Surgeon: Troy Sine, MD;  Location: [facility];  Service: Cardiovascular;  Laterality: N/A;  . CARDIAC  CATHETERIZATION N/A 11/05/2016   Procedure: Coronary Stent Intervention;  Surgeon: Troy Sine, MD;  Location: [facility];  Service: Cardiovascular;  Laterality: N/A;  . CHOLECYSTECTOMY    . COLONOSCOPY  05/06/2006   Diffuse inflammatory changes of the rectal mucosa, consistent  with proctitis.  Otherwise, normal colon to terminal ileum  . CORONARY ANGIOPLASTY  11/04/2016  . CORONARY STENT PLACEMENT     Drug-eluting coronary artery stent, non-bioabsorbable-polymer-coated  . CYSTOSCOPY N/A 05/17/2014   Procedure: CYSTOSCOPY;  Surgeon: Reece Packer, MD;  Location: [facility];  Service: Urology;  Laterality: N/A;  . ESOPHAGOGASTRODUODENOSCOPY  02/09/2008   Schatzki ring status post dilation/Distal esophageal erosion consistent with mild erosive reflux  esophagitis, otherwise unremarkable esophagus, normal stomach, D1, D2.  . EYE SURGERY     lt cataract-implant  . EYE SURGERY     lt-lens repaced,l  . [place] SURGERY  1993  . RE-EXCISION OF BREAST LUMPECTOMY Left 05/19/2013   Procedure: RE-EXCISION OF BREAST LUMPECTOMY;  Surgeon: Edward Jolly, MD;  Location: [facility];  Service: General;  Laterality: Left;  . RETINAL DETACHMENT SURGERY  1978   Left  . VAGINAL HYSTERECTOMY Bilateral 05/17/2014   Procedure: HYSTERECTOMY VAGINAL with Bilateral Salpingo-Oophorectomy; Bladder cystotomy repair;  Surgeon: Marvene Staff, MD;  Location: [facility];  Service: Gynecology;  Laterality: Bilateral;  . VAGINAL PROLAPSE REPAIR N/A 05/17/2014   Procedure: VAGINAL VAULT PROLAPSE AND GRAFT;  Surgeon: Reece Packer, MD;  Location: [facility];  Service: Urology;  Laterality: N/A;    Allergies Contrast media [iodinated diagnostic agents], Lipitor [atorvastatin], Sulfamethoxazole, and Sulfonamide derivatives  Family History  Problem Relation Age of Onset  . Cancer Brother        spinal  . Heart attack Mother   . Heart attack Father   . Heart attack Son   . Heart  attack Son   . Depression Son   . Multiple myeloma Daughter   . Anemia Daughter   . Colon cancer Neg Hx     Social History Social History   Tobacco Use  . Smoking status: Former Smoker    Packs/day: 1.00    Types: Cigarettes    Quit date: 04/28/1990    Years since quitting: 30.2  . Smokeless tobacco: Never Used  . Tobacco comment: quit 28 yrs ago  Substance Use Topics  . Alcohol use: Yes    Alcohol/week: 1.0 standard drink    Types: 1 Glasses of wine per week    Comment: occ  . Drug use: No    Review of Systems  Constitutional: No fever/chills Eyes: No visual changes. ENT: No sore throat. Cardiovascular: Denies chest pain. Respiratory: Denies shortness of breath. Gastrointestinal: No abdominal pain.  No nausea, no vomiting.  No diarrhea.  No constipation. Genitourinary: Negative for dysuria. Musculoskeletal: Negative for back pain. Skin: Negative for rash. Neurological: Negative for headaches. Positive speech change and righ side weakness.   10-point ROS otherwise negative.  ____________________________________________   PHYSICAL EXAM:  VITAL SIGNS: Vitals:   07/24/20 1216 07/24/20 1736  BP: (!) 130/100 (!) 143/74  Pulse: 74 96  Resp: 19 18  Temp: 99.4 F (37.4 C)   SpO2: 100% 95%    Constitutional: Alert and oriented. Well appearing and in no acute distress. Eyes: Conjunctivae are normal. Opacification of the left eye. Right pupil is reactive and EOMI.  Head: Atraumatic. Nose: No congestion/rhinnorhea. Mouth/Throat: Mucous membranes are moist.  Neck: No stridor.   Cardiovascular: Normal rate, regular rhythm. Good peripheral circulation. Grossly normal heart sounds.   Respiratory: Normal respiratory effort.  No retractions. Lungs CTAB. Gastrointestinal: Soft and nontender. No distention.  Musculoskeletal: No lower extremity tenderness nor edema. No gross deformities of extremities. Neurologic: Normal speech and language.  Very mild weakness of grip  strength in the right upper extremity compared to the left.  No pronator drift.  No facial asymmetry. NIHSS of 2.  Skin:  Skin is warm, dry and intact. No rash noted.   ____________________________________________   LABS (all labs ordered are listed, but only abnormal results are displayed)  Labs Reviewed  ETHANOL  PROTIME-INR  APTT  CBC  DIFFERENTIAL  COMPREHENSIVE METABOLIC PANEL  I-STAT CHEM 8, ED  CBG MONITORING, ED   ____________________________________________  EKG   EKG Interpretation  Date/Time:  Monday July 24 2020 13:30:25 EDT Ventricular Rate:  71 PR Interval:  156 QRS Duration: 84 QT Interval:  416 QTC Calculation: 452 R Axis:   46 Text Interpretation: Normal sinus rhythm Normal ECG No significant change since last tracing Confirmed by Richardean Canal [phone]) on 07/25/2020 7:27:47 PM       ____________________________________________  RADIOLOGY  CT head reviewed.  ____________________________________________   PROCEDURES  Procedure(s) performed:   Procedures  CRITICAL CARE Performed by: Maia Plan Total critical care time: 35 minutes Critical care time was exclusive of separately billable procedures and treating other patients. Critical care was necessary to treat or prevent imminent or life-threatening deterioration. Critical care was time spent personally by me on the following activities: development of treatment plan with patient and/or surrogate as well as nursing, discussions with consultants, evaluation of patient's response to treatment, examination of patient, obtaining history from patient or surrogate, ordering and performing treatments and interventions, ordering and review of laboratory studies, ordering and review of radiographic studies, pulse oximetry and re-evaluation of patient's condition.  Alona Bene, MD Emergency Medicine  ____________________________________________   INITIAL IMPRESSION / ASSESSMENT AND PLAN / ED  COURSE  Pertinent labs & imaging results that were available during my care of the patient were reviewed by me and considered in my medical decision making (see chart for details).   Patient arrives to the emergency department as a code stroke by EMS.  Symptoms seem to be improving significantly since onset.  Last seen normal at 9:30 AM.  Patient evaluated by teleneurology.  Patient does have a severe contrast dye allergy.  No LVO concern on exam.  Recommendation from teleneurology is for MRI.  Patient tells me that she has metal in her left eye and dental implants.  She tells me she was told never to have an MRI due to the metal in her eye.   Discussed patient's case with [facility] to request admission. Patient and family (if present) updated with plan. Care transferred to [facility] service.  I reviewed all nursing notes, vitals, pertinent old records, EKGs, labs, imaging (as available).  ____________________________________________  FINAL CLINICAL IMPRESSION(S) / ED DIAGNOSES  Final  diagnoses:  TIA (transient ischemic attack)    Note:  This document was prepared using Dragon voice recognition software and may include unintentional dictation errors.  Nanda Quinton, MD, [facility] Emergency Medicine    Britney Captain, [facility], MD 07/26/20 [phone]

## 2020-07-25 ENCOUNTER — Telehealth: Payer: Self-pay | Admitting: Cardiovascular Disease

## 2020-07-25 ENCOUNTER — Ambulatory Visit (HOSPITAL_COMMUNITY): Payer: Medicare Other | Admitting: Hematology

## 2020-07-25 ENCOUNTER — Other Ambulatory Visit: Payer: Self-pay | Admitting: *Deleted

## 2020-07-25 ENCOUNTER — Telehealth: Payer: Self-pay | Admitting: Radiology

## 2020-07-25 ENCOUNTER — Other Ambulatory Visit: Payer: Self-pay

## 2020-07-25 DIAGNOSIS — G459 Transient cerebral ischemic attack, unspecified: Secondary | ICD-10-CM

## 2020-07-25 DIAGNOSIS — I200 Unstable angina: Secondary | ICD-10-CM

## 2020-07-25 DIAGNOSIS — I2510 Atherosclerotic heart disease of native coronary artery without angina pectoris: Secondary | ICD-10-CM

## 2020-07-25 DIAGNOSIS — R42 Dizziness and giddiness: Secondary | ICD-10-CM

## 2020-07-25 NOTE — Telephone Encounter (Signed)
Enrolled patient for a 30 day [employer] Event monitor to be mailed to patients home. Brief instructions were gone over with the patient and she knows to expect the monitor to arrive in 43-4 days

## 2020-07-25 NOTE — Telephone Encounter (Signed)
New Message:     Daughter called and said pt was taken to [facility] yesterday for a TIA. She wants Dr [name] to please review this in Epic and see if pt can be worked in asap to be seen by him.

## 2020-07-25 NOTE — Progress Notes (Addendum)
Order for 30 day cardiac event monitor placed.

## 2020-07-25 NOTE — Telephone Encounter (Signed)
Spoke with daughter and she is wanting Dr [name] to review patients ED information  She is wanting to see Dr [name] and him only Advised daughter no available appointments with Dr [name] and scheduled patient appointment 9/2 with Lurena Joiner  Will forward to Dr [name] for review

## 2020-07-26 DIAGNOSIS — F5089 Other specified eating disorder: Secondary | ICD-10-CM | POA: Diagnosis not present

## 2020-07-26 DIAGNOSIS — R071 Chest pain on breathing: Secondary | ICD-10-CM | POA: Diagnosis not present

## 2020-07-26 DIAGNOSIS — J309 Allergic rhinitis, unspecified: Secondary | ICD-10-CM | POA: Diagnosis not present

## 2020-07-26 DIAGNOSIS — J029 Acute pharyngitis, unspecified: Secondary | ICD-10-CM | POA: Diagnosis not present

## 2020-07-26 DIAGNOSIS — R61 Generalized hyperhidrosis: Secondary | ICD-10-CM | POA: Diagnosis not present

## 2020-07-26 DIAGNOSIS — N644 Mastodynia: Secondary | ICD-10-CM | POA: Diagnosis not present

## 2020-07-26 DIAGNOSIS — M7918 Myalgia, other site: Secondary | ICD-10-CM | POA: Diagnosis not present

## 2020-07-26 DIAGNOSIS — Z6827 Body mass index (BMI) 27.0-27.9, adult: Secondary | ICD-10-CM | POA: Diagnosis not present

## 2020-07-26 DIAGNOSIS — Z6826 Body mass index (BMI) 26.0-26.9, adult: Secondary | ICD-10-CM | POA: Diagnosis not present

## 2020-07-26 DIAGNOSIS — K219 Gastro-esophageal reflux disease without esophagitis: Secondary | ICD-10-CM | POA: Diagnosis not present

## 2020-07-26 DIAGNOSIS — E663 Overweight: Secondary | ICD-10-CM | POA: Diagnosis not present

## 2020-07-26 DIAGNOSIS — N390 Urinary tract infection, site not specified: Secondary | ICD-10-CM | POA: Diagnosis not present

## 2020-07-26 NOTE — Telephone Encounter (Signed)
Apparently the patient has an appointment to see Kerin Ransom tomorrow advise her to try to keep.  Also a 30-day event monitor was recently ordered from her hospitalization.  Arrange appointment to see me after the monitor is complete

## 2020-07-27 ENCOUNTER — Ambulatory Visit (INDEPENDENT_AMBULATORY_CARE_PROVIDER_SITE_OTHER): Payer: Medicare Other | Admitting: Cardiology

## 2020-07-27 ENCOUNTER — Encounter: Payer: Self-pay | Admitting: Cardiology

## 2020-07-27 ENCOUNTER — Other Ambulatory Visit: Payer: Self-pay

## 2020-07-27 VITALS — BP 112/76 | HR 85 | Ht 66.5 in | Wt 162.0 lb

## 2020-07-27 DIAGNOSIS — I200 Unstable angina: Secondary | ICD-10-CM

## 2020-07-27 DIAGNOSIS — I10 Essential (primary) hypertension: Secondary | ICD-10-CM | POA: Diagnosis not present

## 2020-07-27 DIAGNOSIS — Z853 Personal history of malignant neoplasm of breast: Secondary | ICD-10-CM | POA: Diagnosis not present

## 2020-07-27 DIAGNOSIS — Z9861 Coronary angioplasty status: Secondary | ICD-10-CM

## 2020-07-27 DIAGNOSIS — I2510 Atherosclerotic heart disease of native coronary artery without angina pectoris: Secondary | ICD-10-CM

## 2020-07-27 DIAGNOSIS — G459 Transient cerebral ischemic attack, unspecified: Secondary | ICD-10-CM

## 2020-07-27 DIAGNOSIS — E785 Hyperlipidemia, unspecified: Secondary | ICD-10-CM | POA: Diagnosis not present

## 2020-07-27 DIAGNOSIS — R079 Chest pain, unspecified: Secondary | ICD-10-CM

## 2020-07-27 MED ORDER — ROSUVASTATIN CALCIUM 5 MG PO TABS: ORAL_TABLET | ORAL | 3 refills | Status: DC

## 2020-07-27 NOTE — Assessment & Plan Note (Signed)
She has a PET scan ordered followed by f/u with her oncologist.

## 2020-07-27 NOTE — Assessment & Plan Note (Signed)
H/O statin intolerance. I have convinced her to try Crestor 5 mg 3 x week.  She will continue Zetia

## 2020-07-27 NOTE — Assessment & Plan Note (Signed)
RCA and staged CFX PCI with DES 2017

## 2020-07-27 NOTE — Assessment & Plan Note (Addendum)
Seen in ED 07/24/2020 with TIA symptoms.  MRI revealed chronic small bilateral cerebellar infarcts.

## 2020-07-27 NOTE — Assessment & Plan Note (Addendum)
07/24/2020- normal LVF-Controlled B/P

## 2020-07-27 NOTE — Assessment & Plan Note (Signed)
Chest pain sounds M/S, not cardiac. She previously had good response with NSAIDs but I am hesitant to put her back on this after her recent TIA.

## 2020-07-27 NOTE — Patient Instructions (Addendum)
Medication Instructions:   START Rosuvastatin (Crestor) 5 mg. Take 1 tablet on Mondays, Wednesdays and Fridays    *If you need a refill on your cardiac medications before your next appointment, please call your pharmacy*  Lab Work: NONE ordered at this time of appointment   If you have labs (blood work) drawn today and your tests are completely normal, you will receive your results only by: Marland Kitchen MyChart Message (if you have MyChart) OR . A paper copy in the mail If you have any lab test that is abnormal or we need to change your treatment, we will call you to review the results.  Testing/Procedures: NONE ordered at this time of appointment   Follow-Up: At [facility], you and your health needs are our priority.  As part of our continuing mission to provide you with exceptional heart care, we have created designated Provider Care Teams.  These Care Teams include your primary Cardiologist (physician) and Advanced Practice Providers (APPs -  Physician Assistants and Nurse Practitioners) who all work together to provide you with the care you need, when you need it.   Your next appointment:   6-8 week(s)  The format for your next appointment:   In Person  Provider:   Shelva Majestic, MD  Other Instructions

## 2020-07-27 NOTE — Progress Notes (Signed)
Cardiology Office Note:    Date:  07/27/2020   ID:  Kara Woods, DOB [birth year], MRN [id number]  PCP:  Celene Squibb, MD  Cardiologist:  Shelva Majestic, MD  Electrophysiologist:  None   Referring MD: Celene Squibb, MD   No chief complaint on file. Post ED f/u from 07/24/2020 visit  History of Present Illness:    Kara Woods is a 86 y.o. female with a hx of CAD, status post RCA and staged circumflex intervention with DES in 2017.  The patient also has a history of dyslipidemia with statin intolerance and breast cancer.  She recently saw [name] a 09/13/2020 complaining of musculoskeletal left lateral chest pain.  She was placed on nonsteroidal anti-inflammatories with good results.  She then presented to the emergency room 07/24/2020 with garbled speech.  She was felt to have had a TIA.  She was seen in consult by the neurology group.  MRI revealed small chronic by cerebellar infarcts..  Past Medical History:  Diagnosis Date  . Anxiety   . Arthritis   . Blind left eye   . Breast cancer ([place])    left   . Colitis   . Coronary artery disease    a. 10/2016 Staged PCI: RCA 50p, 40m(2.25x12 Resolute Onyx DES), LCX 50p, 855m2.75x23 Xience Alpine DES). Residual LAD 50p/m, 4565m  . CMarland Kitchenstocele 10/11/2013  . Depression   . Diastolic dysfunction    a. 09/2016 Echo: EF 50%, diffuse hypokinesis, mild LVH, grade 1 diastolic dysfunction, mild mitral regurgitation, mildly dilated left atrium.  . GMarland KitchenRD (gastroesophageal reflux disease)    occasional Tums only  . HOH (hard of hearing)   . Pelvic relaxation 10/11/2013  . Proctitis    colonsocopy 2007, Canasa suppositories  . S/P endoscopy March 2009   mild erosive reflux esophagitis, Schatzki's ring, s/p dilation  . Schatzki's ring   . Shortness of breath    occ if anxiety  . Wears dentures    upper  . Wears glasses     Past Surgical History:  Procedure Laterality Date  . ABDOMINAL HYSTERECTOMY    . ANTERIOR AND POSTERIOR REPAIR  N/A 05/17/2014   Procedure: ANTERIOR (CYSTOCELE) AND POSTERIOR REPAIR (RECTOCELE);  Surgeon: ScoReece PackerD;  [facility];  Service: Urology;  Laterality: N/A;  . APPENDECTOMY    . BREAST LUMPECTOMY WITH NEEDLE LOCALIZATION AND AXILLARY SENTINEL LYMPH NODE BX Left 05/03/2013   Procedure: BREAST LUMPECTOMY WITH NEEDLE LOCALIZATION AND AXILLARY SENTINEL LYMPH NODE BX;  Surgeon: BenEdward JollyD;  [facility];  Laterality: Left;  . BREAST SURGERY Left 05/19/13  . CARDIAC CATHETERIZATION  1998  . CARDIAC CATHETERIZATION N/A 11/04/2016   Procedure: Left Heart Cath and Coronary Angiography;  Surgeon: ThoTroy SineD;  Location[facility];  Service: Cardiovascular;  Laterality: N/A;  . CARDIAC CATHETERIZATION N/A 11/05/2016   Procedure: Coronary Stent Intervention;  Surgeon: ThoTroy SineD;  Location: [facility];  Service: Cardiovascular;  Laterality: N/A;  . CHOLECYSTECTOMY    . COLONOSCOPY  05/06/2006   Diffuse inflammatory changes of the rectal mucosa, consistent  with proctitis.  Otherwise, normal colon to terminal ileum  . CORONARY ANGIOPLASTY  11/04/2016  . CORONARY STENT PLACEMENT     Drug-eluting coronary artery stent, non-bioabsorbable-polymer-coated  . CYSTOSCOPY N/A 05/17/2014   Procedure: CYSTOSCOPY;  Surgeon: ScoReece PackerD;  [facility];  Service: Urology;  Laterality: N/A;  .  ESOPHAGOGASTRODUODENOSCOPY  02/09/2008   Schatzki ring status post dilation/Distal esophageal erosion consistent with mild erosive reflux  esophagitis, otherwise unremarkable esophagus, normal stomach, D1, D2.  . EYE SURGERY     lt cataract-implant  . EYE SURGERY     lt-lens repaced,l  . [facility] SURGERY  1993  . RE-EXCISION OF BREAST LUMPECTOMY Left 05/19/2013   Procedure: RE-EXCISION OF BREAST LUMPECTOMY;  Surgeon: Edward Jolly, MD;  Location: [facility];  Service: General;  Laterality:  Left;  . RETINAL DETACHMENT SURGERY  1978   Left  . VAGINAL HYSTERECTOMY Bilateral 05/17/2014   Procedure: HYSTERECTOMY VAGINAL with Bilateral Salpingo-Oophorectomy; Bladder cystotomy repair;  Surgeon: Marvene Staff, MD;  Location: [facility];  Service: Gynecology;  Laterality: Bilateral;  . VAGINAL PROLAPSE REPAIR N/A 05/17/2014   Procedure: VAGINAL VAULT PROLAPSE AND GRAFT;  Surgeon: Reece Packer, MD;  Location: [facility];  Service: Urology;  Laterality: N/A;    Current Medications: Current Meds  Medication Sig  . acetaminophen (TYLENOL) 500 MG tablet Take 500-1,000 mg by mouth every 6 (six) hours as needed for mild pain.  Marland Kitchen aspirin EC 81 MG tablet Take 1 tablet (81 mg total) by mouth daily with breakfast.  . carboxymethylcellulose (REFRESH PLUS) 0.5 % SOLN Place 3-4 drops into both eyes 3 (three) times daily as needed (dry eyes). Preservative free  . ciprofloxacin (CIPRO) 500 MG tablet Take 500 mg by mouth 2 (two) times daily.  . clopidogrel (PLAVIX) 75 MG tablet Take 75 mg by mouth daily.   Marland Kitchen ezetimibe (ZETIA) 10 MG tablet Take 10 mg by mouth daily.  . metoprolol tartrate (LOPRESSOR) 25 MG tablet TAKE 1/2 TABLET BY MOUTH TWICE A DAY  . nitroGLYCERIN (NITROSTAT) 0.4 MG SL tablet Place 1 tablet (0.4 mg total) under the tongue every 5 (five) minutes as needed.  . Travoprost, BAK Free, (TRAVATAN) 0.004 % SOLN ophthalmic solution Place 1 drop into both eyes at bedtime.      Allergies:   Contrast media [iodinated diagnostic agents], Lipitor [atorvastatin], Sulfamethoxazole, and Sulfonamide derivatives   Social History   Socioeconomic History  . Marital status: Widowed    Spouse name: Not on file  . Number of children: 6  . Years of education: Not on file  . Highest education level: Not on file  Occupational History  . Occupation: Retired  Tobacco Use  . Smoking status: Former Smoker    Packs/day: 1.00    Types: Cigarettes    Quit date: 04/28/1990    Years since quitting: 30.2   . Smokeless tobacco: Never Used  . Tobacco comment: quit 28 yrs ago  Substance and Sexual Activity  . Alcohol use: Yes    Alcohol/week: 1.0 standard drink    Types: 1 Glasses of wine per week    Comment: occ  . Drug use: No  . Sexual activity: Not Currently    Birth control/protection: Post-menopausal  Other Topics Concern  . Not on file  Social History Narrative  . Not on file   Social Determinants of Health   Financial Resource Strain:   . Difficulty of Paying Living Expenses: Not on file  Food Insecurity:   . Worried About Charity fundraiser in the Last Year: Not on file  . Ran Out of Food in the Last Year: Not on file  Transportation Needs:   . Lack of Transportation (Medical): Not on file  . Lack of Transportation (Non-Medical): Not on file  Physical Activity:   .  Days of Exercise per Week: Not on file  . Minutes of Exercise per Session: Not on file  Stress:   . Feeling of Stress : Not on file  Social Connections:   . Frequency of Communication with Friends and Family: Not on file  . Frequency of Social Gatherings with Friends and Family: Not on file  . Attends Religious Services: Not on file  . Active Member of Clubs or Organizations: Not on file  . Attends Archivist Meetings: Not on file  . Marital Status: Not on file     Family History: The patient's family history includes Anemia in her daughter; Cancer in her brother; Depression in her son; Heart attack in her father, mother, son, and son; Multiple myeloma in her daughter. There is no history of Colon cancer.  ROS:   Please see the history of present illness.     All other systems reviewed and are negative.  EKGs/Labs/Other Studies Reviewed:    The following studies were reviewed today: Echo 07/24/2020- IMPRESSIONS    1. Left ventricular ejection fraction, by estimation, is 60 to 65%. The  left ventricle has normal function. The left ventricle has no regional  wall motion abnormalities.  Left ventricular diastolic parameters are  consistent with Grade I diastolic  dysfunction (impaired relaxation).  2. Right ventricular systolic function is normal. The right ventricular  size is normal. There is normal pulmonary artery systolic pressure.  3. Left atrial size was mildly dilated.  4. The mitral valve is normal in structure. Trivial mitral valve  regurgitation. No evidence of mitral stenosis.  5. The aortic valve is tricuspid. Aortic valve regurgitation is not  visualized. No aortic stenosis is present.  6. The inferior vena cava is normal in size with greater than 50%  respiratory variability, suggesting right atrial pressure of 3 mmHg.   EKG:  EKG is ordered today.  The ekg ordered today demonstrates NSR, HR 85  Recent Labs: 07/24/2020: ALT 16; BUN 19; Creatinine, Ser 0.70; Hemoglobin 13.9; Platelets 226; Potassium 4.2; Sodium 141  Recent Lipid Panel    Component Value Date/Time   CHOL 227 (H) 10/31/2016 1245   TRIG 164 (H) 10/31/2016 1245   HDL 47 (L) 10/31/2016 1245   CHOLHDL 4.8 10/31/2016 1245   VLDL 33 (H) 10/31/2016 1245   LDLCALC 147 (H) 10/31/2016 1245    Physical Exam:    VS:  BP 112/76   Pulse 85   Ht 5' 6.5" (1.689 m)   Wt 162 lb (73.5 kg)   SpO2 99%   BMI 25.76 kg/m     Wt Readings from Last 3 Encounters:  07/27/20 162 lb (73.5 kg)  07/13/20 166 lb (75.3 kg)  07/04/20 166 lb (75.3 kg)     GEN:  Well nourished, well developed in no acute distress HEENT: Normal-blind OS NECK: No JVD; No carotid bruits LYMPHATICS: No lymphadenopathy CARDIAC: RRR, no murmurs, rubs, gallops RESPIRATORY:  Clear to auscultation without rales, wheezing or rhonchi  ABDOMEN: Soft, non-tender, non-distended MUSCULOSKELETAL:  No edema; No deformity  SKIN: Warm and dry NEUROLOGIC:  Alert and oriented x 3 PSYCHIATRIC:  Normal affect   ASSESSMENT:    TIA (transient ischemic attack) Seen in ED 07/24/2020 with TIA symptoms.  MRI revealed chronic small bilateral  cerebellar infarcts.   CAD S/P percutaneous coronary angioplasty RCA and staged CFX PCI with DES 2017  Essential hypertension 07/24/2020- normal LVF-Controlled B/P  Dyslipidemia, goal LDL below 70 H/O statin intolerance. I have convinced her  to try Crestor 5 mg 3 x week.  She will continue Zetia  History of breast cancer She has a PET scan ordered followed by f/u with her oncologist.   Chest pain in adult Chest pain sounds M/S, not cardiac. She previously had good response with NSAIDs but I am hesitant to put her back on this after her recent TIA.   PLAN:    30 day monitor ordered.  Add Crestor 5 mg MWF.  Try tylenol and local heat for chest pain.  Keep f/u for PET scan on oncology as scheduled.  F/U Dr [name] 6-8 weeks.   Medication Adjustments/Labs and Tests Ordered: Current medicines are reviewed at length with the patient today.  Concerns regarding medicines are outlined above.  Orders Placed This Encounter  Procedures  . EKG 12-Lead   Meds ordered this encounter  Medications  . rosuvastatin (CRESTOR) 5 MG tablet    Sig: Take 1 tablet (5 mg) by mouth on Mondays, Wednesdays and Fridays.    Dispense:  36 tablet    Refill:  3    Patient Instructions  Medication Instructions:   START Rosuvastatin (Crestor) 5 mg. Take 1 tablet on Mondays, Wednesdays and Fridays    *If you need a refill on your cardiac medications before your next appointment, please call your pharmacy*  Lab Work: NONE ordered at this time of appointment   If you have labs (blood work) drawn today and your tests are completely normal, you will receive your results only by: Marland Kitchen MyChart Message (if you have MyChart) OR . A paper copy in the mail If you have any lab test that is abnormal or we need to change your treatment, we will call you to review the results.  Testing/Procedures: NONE ordered at this time of appointment   Follow-Up: At [facility], you and your health needs are our priority.  As  part of our continuing mission to provide you with exceptional heart care, we have created designated Provider Care Teams.  These Care Teams include your primary Cardiologist (physician) and Advanced Practice Providers (APPs -  Physician Assistants and Nurse Practitioners) who all work together to provide you with the care you need, when you need it.   Your next appointment:   6-8 week(s)  The format for your next appointment:   In Person  Provider:   Shelva Majestic, MD  Other Instructions      Signed, Kerin Ransom, PA-C  07/27/2020 10:44 AM    [facility]

## 2020-07-27 NOTE — Telephone Encounter (Signed)
Saw pt and daughter in office after seeing Kerin Ransom PA. They were advised by luke to schedule a 6-8 week follow up with dr.Kelly. notified of Dr.Kellys recommendations as to see [place] following her heart monitor. Pt and daughter verbalized understanding and have appt with Dr.Kelly 09/19/20 at 4:00pm

## 2020-07-28 ENCOUNTER — Telehealth: Payer: Self-pay | Admitting: Cardiovascular Disease

## 2020-07-28 NOTE — Telephone Encounter (Addendum)
Patient's wife   Pt c/o medication issue:  1. Name of Medication: rosuvastatin (CRESTOR) 5 MG tablet  2. How are you currently taking this medication (dosage and times per day)? Has not started it yet  3. Are you having a reaction (difficulty breathing--STAT)? no  4. What is your medication issue? Patient's daughter states the patient is already taking zetia and was prescribed crestor yesterday. She would like to know if she should be taking both for her cholesterol.

## 2020-07-28 NOTE — Telephone Encounter (Signed)
Advise dtr that is was ok for mom to take both medications. She is agreeable to plan

## 2020-08-01 ENCOUNTER — Encounter (HOSPITAL_COMMUNITY): Payer: Medicare Other

## 2020-08-02 ENCOUNTER — Encounter (INDEPENDENT_AMBULATORY_CARE_PROVIDER_SITE_OTHER): Payer: Medicare Other

## 2020-08-02 ENCOUNTER — Telehealth: Payer: Self-pay | Admitting: Cardiovascular Disease

## 2020-08-02 DIAGNOSIS — I200 Unstable angina: Secondary | ICD-10-CM

## 2020-08-02 DIAGNOSIS — R42 Dizziness and giddiness: Secondary | ICD-10-CM

## 2020-08-02 DIAGNOSIS — I4891 Unspecified atrial fibrillation: Secondary | ICD-10-CM | POA: Diagnosis not present

## 2020-08-02 DIAGNOSIS — I2510 Atherosclerotic heart disease of native coronary artery without angina pectoris: Secondary | ICD-10-CM

## 2020-08-02 DIAGNOSIS — G459 Transient cerebral ischemic attack, unspecified: Secondary | ICD-10-CM

## 2020-08-02 NOTE — Telephone Encounter (Signed)
° ° ° °  Pt said she received her heart monitor but it said there she needs a landline to connect it. She doesn't have a landline and wanted to ask if she ned to send it back since she can't use it

## 2020-08-02 NOTE — Telephone Encounter (Signed)
Confirmed with [employer] rep patient was sent monitor with cell phone and no land line is needed. Returned call to the patient she will see if her daughter can help her.

## 2020-08-02 NOTE — Telephone Encounter (Signed)
Patient called in regarding her [employer] heart monitor. Patient states she has to have a land line to be able to use monitor and would like to send the monitor back. Advised patient to call the monitor company on the box and her paper work to request help with this issue. Advised patient I would contact our Heart monitor device department as well for advice.

## 2020-08-07 ENCOUNTER — Ambulatory Visit (HOSPITAL_COMMUNITY): Payer: Medicare Other | Admitting: Hematology

## 2020-08-07 ENCOUNTER — Other Ambulatory Visit: Payer: Self-pay

## 2020-08-07 ENCOUNTER — Encounter (HOSPITAL_COMMUNITY)
Admission: RE | Admit: 2020-08-07 | Discharge: 2020-08-07 | Disposition: A | Discharge status: Home / Self Care | Payer: Medicare Other | Source: Ambulatory Visit | Attending: Hematology | Admitting: Hematology

## 2020-08-07 DIAGNOSIS — I700 Atherosclerosis of aorta: Secondary | ICD-10-CM | POA: Diagnosis not present

## 2020-08-07 DIAGNOSIS — C50212 Malignant neoplasm of upper-inner quadrant of left female breast: Secondary | ICD-10-CM | POA: Diagnosis not present

## 2020-08-07 DIAGNOSIS — Z170 Estrogen receptor positive status [ER+]: Secondary | ICD-10-CM | POA: Insufficient documentation

## 2020-08-07 DIAGNOSIS — C50919 Malignant neoplasm of unspecified site of unspecified female breast: Secondary | ICD-10-CM | POA: Diagnosis not present

## 2020-08-07 DIAGNOSIS — C7951 Secondary malignant neoplasm of bone: Secondary | ICD-10-CM | POA: Diagnosis not present

## 2020-08-07 DIAGNOSIS — I2510 Atherosclerotic heart disease of native coronary artery without angina pectoris: Secondary | ICD-10-CM | POA: Diagnosis not present

## 2020-08-07 IMAGING — PT NM PET TUM IMG INITIAL (PI) SKULL BASE T - THIGH
7 series · 25 of 25 positions shown · non-contrast
Comparison: [DATE] abdominopelvic CT. CTA chest [DATE].

CLINICAL DATA: Subsequent treatment strategy for left sided
lumpectomy 7 years ago for breast cancer. Restaging. Elevated Cea.
COVID vaccine in left arm [DATE].

EXAM:
NUCLEAR MEDICINE PET SKULL BASE TO THIGH
TECHNIQUE: 9.2 mCi F-18 FDG was injected intravenously. Full-ring PET imaging
was performed from the skull base to thigh after the radiotracer. CT
data was obtained and used for attenuation correction and anatomic
localization.
Fasting blood glucose: 92 mg/dl

[Series 3: ct wb fusion · axial · 5.0mm · 0.98mm/px · z∈[-1560,-686]mm · 6 of 351 slices shown]
[im 1/351]
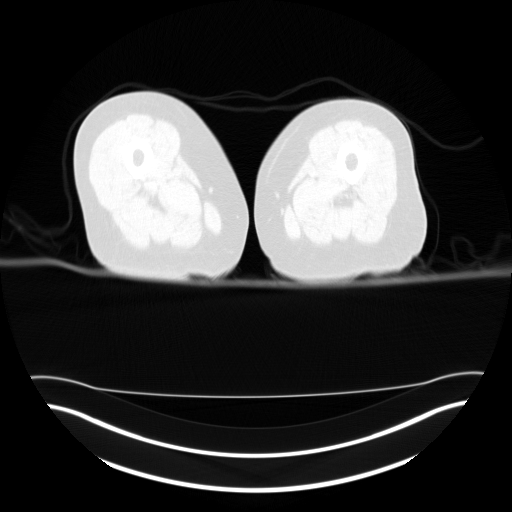
[im 71/351]
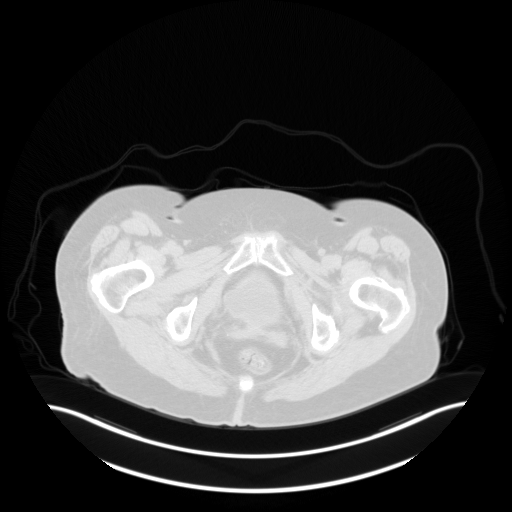
[im 141/351]
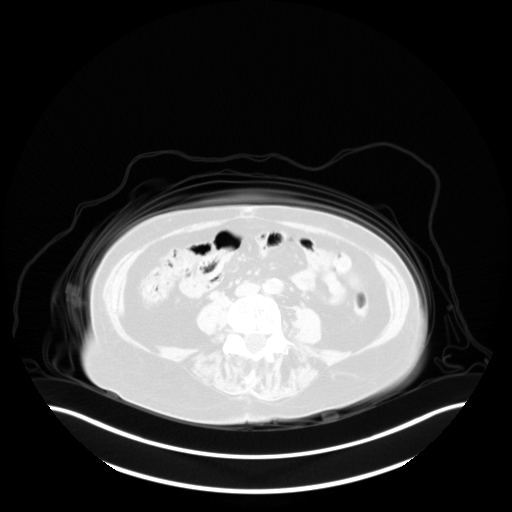
[im 211/351]
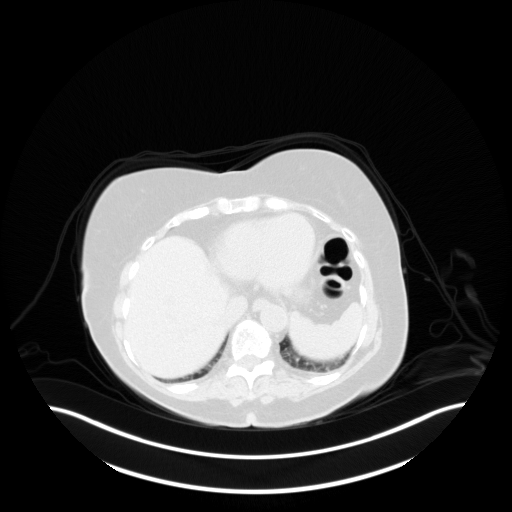
[im 281/351]
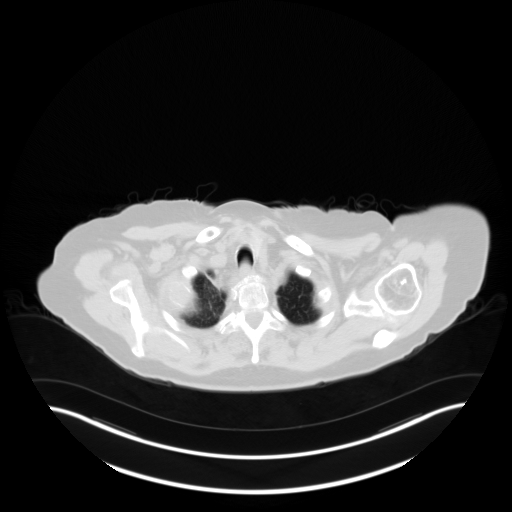
[im 351/351  brain]
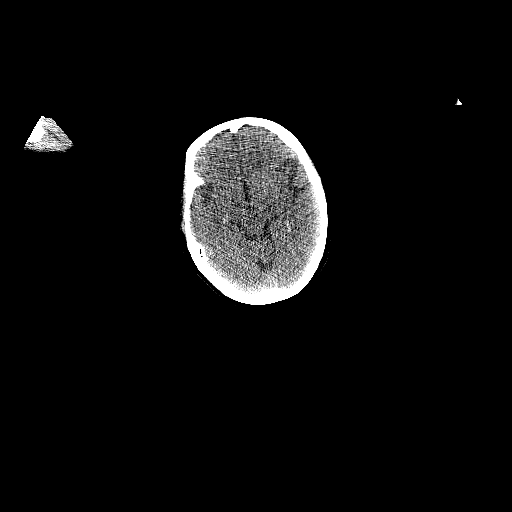

[Series 4: pet wb · axial · 5.0mm · 4.11mm/px · z∈[-1560,-686]mm · 6 of 351 slices shown]
[im 1/351]
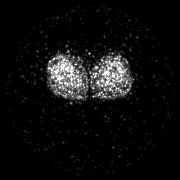
[im 71/351]
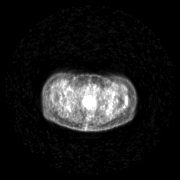
[im 141/351]
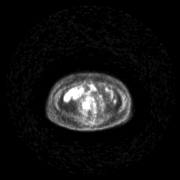
[im 211/351]
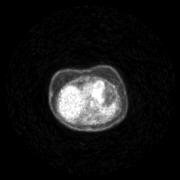
[im 281/351]
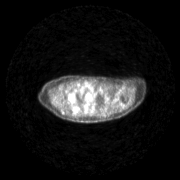
[im 351/351]
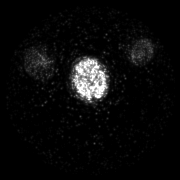

[Series 5: pet wb uncorrected · axial · 5.0mm · 4.11mm/px · z∈[-1560,-686]mm · 7 of 351 slices shown]
[im 1/351]
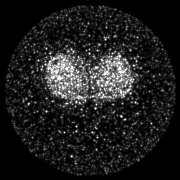
[im 59/351]
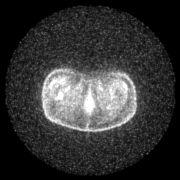
[im 117/351]
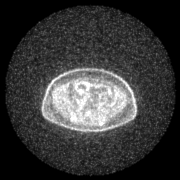
[im 176/351]
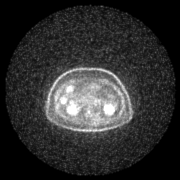
[im 234/351]
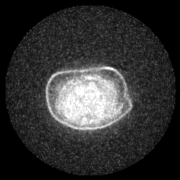
[im 292/351]
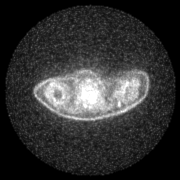
[im 351/351]
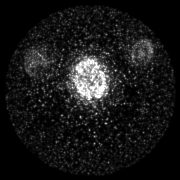

[axial ct wb fusion · 3 of 172 slices shown]
[im 1/172]
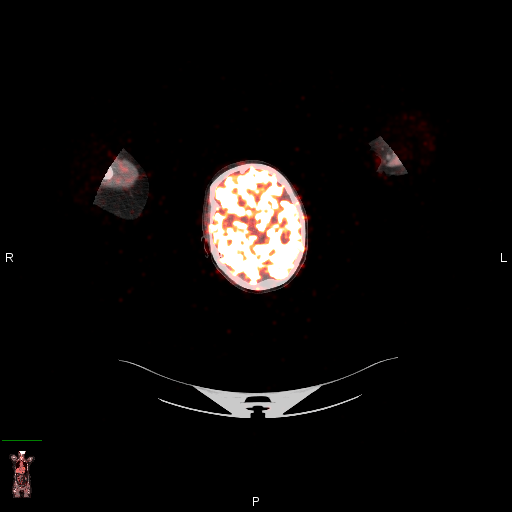
[im 86/172]
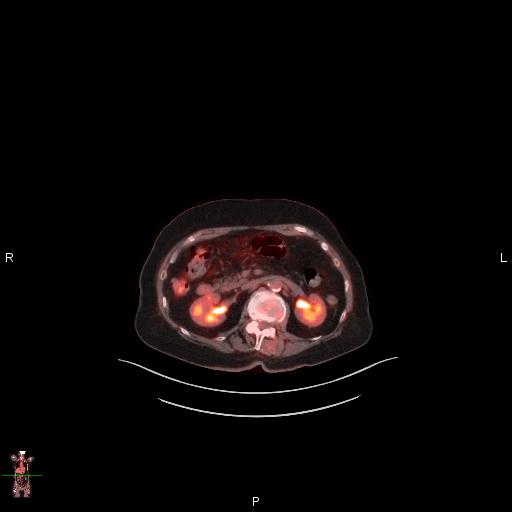
[im 172/172]
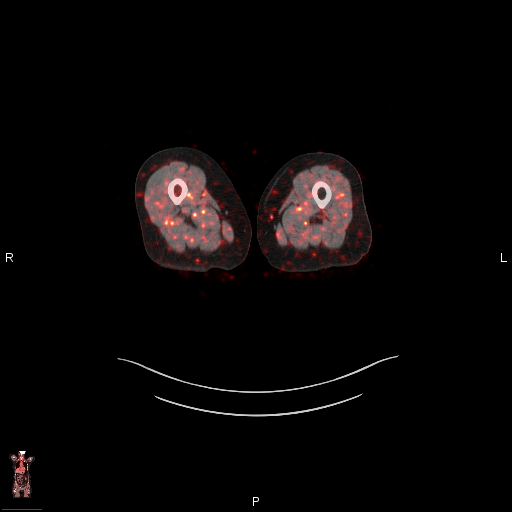

[coronal ct wb fusion · 1 of 33 slices shown]
[im 1/33]
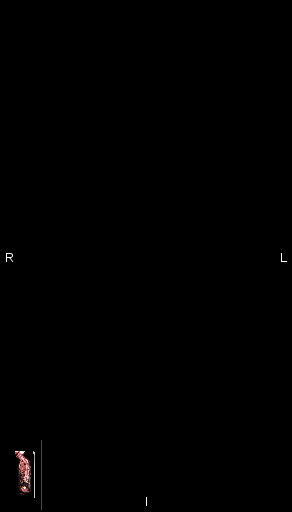

[mip · 1 of 48 slices shown]
[im 1/48]
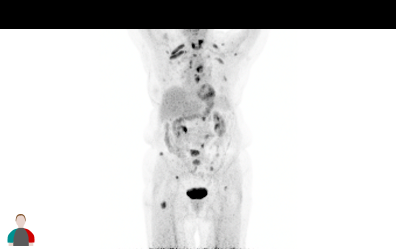

[results mm oncology reading · 3.0mm · 0.95mm/px · 1 of 8 slices shown]
[im 1/8]
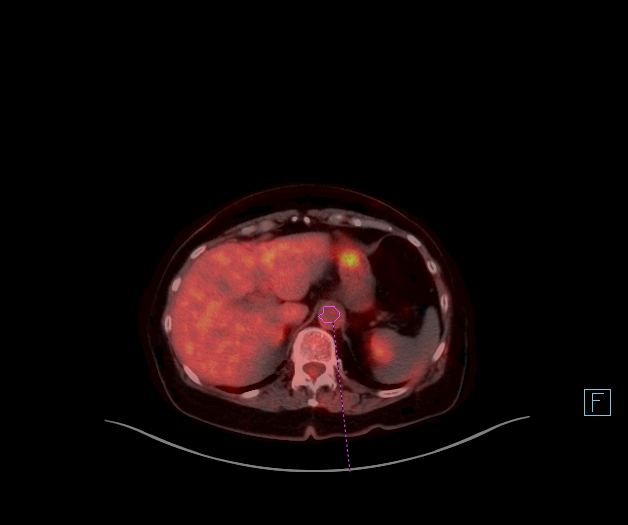

[25 of 25 positions shown; findings below may reference images not displayed]

FINDINGS: Mediastinal blood pool activity: SUV max

Liver activity: SUV max NA

NECK: No areas of abnormal hypermetabolism.

Incidental CT findings: No cervical adenopathy. Advanced bilateral
carotid atherosclerosis. Surgical changes about the left globe.

CHEST: Hypermetabolic thoracic adenopathy. Right paratracheal 1.5 cm
node measures a S.U.V. max of 5.9 on 92/3.

Adenopathy within the prevascular and left internal mammary spaces
including at 2.4 x 2.1 cm and a S.U.V. max of 6.9 on 98/3.

Incidental CT findings: Aortic and coronary artery atherosclerosis.
Mild cardiomegaly. Centrilobular emphysema.

ABDOMEN/PELVIS: No abdominopelvic parenchymal or nodal
hypermetabolism.

Incidental CT findings: Normal adrenal glands. Cholecystectomy.
Abdominal aortic atherosclerosis. Hysterectomy. Pelvic floor laxity.

SKELETON: Widespread hypermetabolic osseous metastasis. Right
posterior third rib expansile hypermetabolic lesion on the order of
3.8 cm and a S.U.V. max of 8.5 on 78/3.

A T1 lytic lesion with probable extension into the canal measures on
the order of 2.8 cm and a S.U.V. max of 10.8 on 64/3.

Right femoral neck lytic lesion measures 1.8 cm and a S.U.V. max of
7.5 on 295/3.

A sacral osseous and soft tissue density lesion in the midline
measures 2.6 x 2.0 cm and a S.U.V. max of 7.2 on 230/3.

Incidental CT findings: none
IMPRESSION: 1. Widespread hypermetabolic osseous metastasis, including lesions
with soft tissue components at T1 and the sacrum. Consider pre and
postcontrast thoracic spine MRI. Lumbosacral spine imaging could be
performed based on clinical symptoms.
2. Thoracic nodal metastasis. No other evidence of soft tissue
metastasis.
3. Incidental findings, including: Aortic atherosclerosis
([HQ]-[HQ]), coronary artery atherosclerosis and emphysema
([HQ]-[HQ]). Pelvic floor laxity.

## 2020-08-07 MED ORDER — FLUDEOXYGLUCOSE F - 18 (FDG) INJECTION
9.2400 | Freq: Once | INTRAVENOUS | Status: AC | PRN
  Administered 2020-08-07: 9.24 via INTRAVENOUS

## 2020-08-08 ENCOUNTER — Telehealth: Payer: Self-pay | Admitting: Cardiovascular Disease

## 2020-08-08 NOTE — Telephone Encounter (Signed)
1. What dental office are you calling from? Dr. Lavada Mesi Dentist  2. What is your office phone number? [phone]   What is your fax number? [phone] 3. What type of procedure is the patient having performed? 1 tooth extraction (lower pre-molar)   4. What date is procedure scheduled or is the patient there now? 08/08/20 - patient is at the office, in chair now.     5. What is your question ? Their office is inquiring about whether or not the patient needs to hold clopidogrel (PLAVIX) 75 MG tablet. Please advise.

## 2020-08-08 NOTE — Telephone Encounter (Signed)
Spoke with patient's daughter, husband of dentist. Patient needs to have tooth extracted, root removed under crown due to decay.   Patient has NOT taken Plavix today. Last dose yesterday morning  [name] PA ordered 30 day event monitor ordered - patient with recent TIA  [phone] Webb Silversmith daughter)   Durene Cal to MD

## 2020-08-08 NOTE — Telephone Encounter (Signed)
Called and spoke with Dr. Jimmye Norman - ok to hold Plavix for 5 days, restart next week,  Dr Debara Pickett

## 2020-08-10 ENCOUNTER — Ambulatory Visit (HOSPITAL_COMMUNITY): Payer: Medicare Other | Admitting: Hematology

## 2020-08-14 ENCOUNTER — Encounter (HOSPITAL_COMMUNITY): Payer: Self-pay | Admitting: Radiology

## 2020-08-14 ENCOUNTER — Other Ambulatory Visit: Payer: Self-pay

## 2020-08-14 ENCOUNTER — Inpatient Hospital Stay (HOSPITAL_COMMUNITY): Payer: Medicare Other | Attending: Hematology | Admitting: Hematology

## 2020-08-14 ENCOUNTER — Other Ambulatory Visit (HOSPITAL_COMMUNITY): Payer: Self-pay | Admitting: *Deleted

## 2020-08-14 VITALS — BP 137/81 | HR 103 | Temp 96.8°F | Resp 19 | Wt 161.4 lb

## 2020-08-14 DIAGNOSIS — R002 Palpitations: Secondary | ICD-10-CM | POA: Insufficient documentation

## 2020-08-14 DIAGNOSIS — C50212 Malignant neoplasm of upper-inner quadrant of left female breast: Secondary | ICD-10-CM | POA: Diagnosis not present

## 2020-08-14 DIAGNOSIS — Z170 Estrogen receptor positive status [ER+]: Secondary | ICD-10-CM | POA: Diagnosis not present

## 2020-08-14 DIAGNOSIS — I2510 Atherosclerotic heart disease of native coronary artery without angina pectoris: Secondary | ICD-10-CM | POA: Insufficient documentation

## 2020-08-14 DIAGNOSIS — Z923 Personal history of irradiation: Secondary | ICD-10-CM | POA: Insufficient documentation

## 2020-08-14 DIAGNOSIS — Z853 Personal history of malignant neoplasm of breast: Secondary | ICD-10-CM | POA: Insufficient documentation

## 2020-08-14 DIAGNOSIS — M25512 Pain in left shoulder: Secondary | ICD-10-CM | POA: Diagnosis not present

## 2020-08-14 DIAGNOSIS — K5900 Constipation, unspecified: Secondary | ICD-10-CM | POA: Insufficient documentation

## 2020-08-14 DIAGNOSIS — R0602 Shortness of breath: Secondary | ICD-10-CM | POA: Insufficient documentation

## 2020-08-14 DIAGNOSIS — C7951 Secondary malignant neoplasm of bone: Secondary | ICD-10-CM | POA: Diagnosis not present

## 2020-08-14 DIAGNOSIS — Z87891 Personal history of nicotine dependence: Secondary | ICD-10-CM | POA: Diagnosis not present

## 2020-08-14 DIAGNOSIS — R5383 Other fatigue: Secondary | ICD-10-CM | POA: Diagnosis not present

## 2020-08-14 DIAGNOSIS — R079 Chest pain, unspecified: Secondary | ICD-10-CM | POA: Insufficient documentation

## 2020-08-14 MED ORDER — HYDROCODONE-ACETAMINOPHEN 5-325 MG PO TABS: 1.0000 | ORAL_TABLET | Freq: Four times a day (QID) | ORAL | 0 refills | Status: DC | PRN

## 2020-08-14 NOTE — Patient Instructions (Signed)
[employer] at [facility] Discharge Instructions  You were seen today by Dr. Delton Coombes. He went over your recent results and scans; the cancer has spread to the ribs and bones. You will be referred to a radiologist for biopsy. You will also be scheduled for an MRI scan of your spine. You will be prescribed hydrocodone-acetaminophen 5-325 for your pain control. Dr. Delton Coombes will see you back after the biopsy for labs and follow up.   Thank you for choosing [place] at [facility] to provide your oncology and hematology care.  To afford each patient quality time with our provider, please arrive at least 15 minutes before your scheduled appointment time.   If you have a lab appointment with the [facility] please come in thru the Main Entrance and check in at the main information desk  You need to re-schedule your appointment should you arrive 10 or more minutes late.  We strive to give you quality time with our providers, and arriving late affects you and other patients whose appointments are after yours.  Also, if you no show three or more times for appointments you may be dismissed from the clinic at the providers discretion.     Again, thank you for choosing [facility].  Our hope is that these requests will decrease the amount of time that you wait before being seen by our physicians.       _____________________________________________________________  Should you have questions after your visit to [facility], please contact our office at [phone] between the hours of 8:00 a.m. and 4:30 p.m.  Voicemails left after 4:00 p.m. will not be returned until the following business day.  For prescription refill requests, have your pharmacy contact our office and allow 72 hours.    Cancer Center Support Programs:   > Cancer Support Group  2nd Tuesday of the month 1pm-2pm, Journey Room

## 2020-08-14 NOTE — Progress Notes (Signed)
[facility] [address], [place] [address]   CLINIC:  Medical Oncology/Hematology  PCP:  Celene Squibb, MD [address] Liana Cro[place] [address] [phone]   REASON FOR VISIT:  Follow-up for left breast cancer  PRIOR THERAPY:  1. Left lumpectomy in 04/2013. 2. Radiation therapy.  NGS Results: None  CURRENT THERAPY: Under work-up  BRIEF ONCOLOGIC HISTORY:  Oncology History   No history exists.    CANCER STAGING: Cancer Staging History of breast cancer Staging form: Breast, AJCC 7th Edition - Clinical stage from 04/20/2013: Stage IIA (T2, N0, cM0) - Unsigned - Pathologic: No stage assigned - Unsigned   INTERVAL HISTORY:  Ms. TANAJAH BOULTER, a 86 y.o. female, returns for routine follow-up of her left breast cancer. Mariadelcarmen was last seen on 07/13/2020.  Today she is accompanied by her 2 daughters. She complains of having pain in her right chest under her breast and pain in the left shoulder. The pain is waxing and waning and sharp, stabbing in nature; she was taking Aleve for pain control but now is only taking Tylenol. She is reporting weakness in her legs, but has not fallen. She reportedly had a TIA since the last visit where she couldn't communicate. Her appetite is okay and she eats when her daughters prepare meals. She denies having headaches or incontinence. She has intermittent constipation.   REVIEW OF SYSTEMS:  Review of Systems  Constitutional: Positive for appetite change (mildly decreased) and fatigue (severe).  Respiratory: Positive for shortness of breath.   Cardiovascular: Positive for chest pain (10/10 chest pain under R breast) and palpitations.  Gastrointestinal: Positive for constipation (occasional).  Genitourinary: Negative for bladder incontinence.   Musculoskeletal: Positive for arthralgias (10/10 L shoulder pain).  Neurological: Positive for extremity weakness (lower extremities weakness). Negative for headaches.    Psychiatric/Behavioral: The patient is nervous/anxious.   All other systems reviewed and are negative.   PAST MEDICAL/SURGICAL HISTORY:  Past Medical History:  Diagnosis Date  . Anxiety   . Arthritis   . Blind left eye   . Breast cancer ([facility])    left   . Colitis   . Coronary artery disease    a. 10/2016 Staged PCI: RCA 50p, 37m(2.25x12 Resolute Onyx DES), LCX 50p, 874m2.75x23 Xience Alpine DES). Residual LAD 50p/m, 4559m  . CMarland Kitchenstocele 10/11/2013  . Depression   . Diastolic dysfunction    a. 09/2016 Echo: EF 50%, diffuse hypokinesis, mild LVH, grade 1 diastolic dysfunction, mild mitral regurgitation, mildly dilated left atrium.  . GMarland KitchenRD (gastroesophageal reflux disease)    occasional Tums only  . HOH (hard of hearing)   . Pelvic relaxation 10/11/2013  . Proctitis    colonsocopy 2007, Canasa suppositories  . S/P endoscopy March 2009   mild erosive reflux esophagitis, Schatzki's ring, s/p dilation  . Schatzki's ring   . Shortness of breath    occ if anxiety  . Wears dentures    upper  . Wears glasses    Past Surgical History:  Procedure Laterality Date  . ABDOMINAL HYSTERECTOMY    . ANTERIOR AND POSTERIOR REPAIR N/A 05/17/2014   Procedure: ANTERIOR (CYSTOCELE) AND POSTERIOR REPAIR (RECTOCELE);  Surgeon: ScoReece PackerD;  Location: [facility];  Service: Urology;  Laterality: N/A;  . APPENDECTOMY    . BREAST LUMPECTOMY WITH NEEDLE LOCALIZATION AND AXILLARY SENTINEL LYMPH NODE BX Left 05/03/2013   Procedure: BREAST LUMPECTOMY WITH NEEDLE LOCALIZATION AND AXILLARY SENTINEL LYMPH NODE BX;  Surgeon: Edward Jolly, MD;  Location: [facility];  Service: General;  Laterality: Left;  . BREAST SURGERY Left 05/19/13  . CARDIAC CATHETERIZATION  1998  . CARDIAC CATHETERIZATION N/A 11/04/2016   Procedure: Left Heart Cath and Coronary Angiography;  Surgeon: Troy Sine, MD;  Location: [facility];  Service: Cardiovascular;  Laterality: N/A;  . CARDIAC  CATHETERIZATION N/A 11/05/2016   Procedure: Coronary Stent Intervention;  Surgeon: Troy Sine, MD;  Location: [facility];  Service: Cardiovascular;  Laterality: N/A;  . CHOLECYSTECTOMY    . COLONOSCOPY  05/06/2006   Diffuse inflammatory changes of the rectal mucosa, consistent  with proctitis.  Otherwise, normal colon to terminal ileum  . CORONARY ANGIOPLASTY  11/04/2016  . CORONARY STENT PLACEMENT     Drug-eluting coronary artery stent, non-bioabsorbable-polymer-coated  . CYSTOSCOPY N/A 05/17/2014   Procedure: CYSTOSCOPY;  Surgeon: Reece Packer, MD;  Location: [facility];  Service: Urology;  Laterality: N/A;  . ESOPHAGOGASTRODUODENOSCOPY  02/09/2008   Schatzki ring status post dilation/Distal esophageal erosion consistent with mild erosive reflux  esophagitis, otherwise unremarkable esophagus, normal stomach, D1, D2.  . EYE SURGERY     lt cataract-implant  . EYE SURGERY     lt-lens repaced,l  . [place] SURGERY  1993  . RE-EXCISION OF BREAST LUMPECTOMY Left 05/19/2013   Procedure: RE-EXCISION OF BREAST LUMPECTOMY;  Surgeon: Edward Jolly, MD;  Location: [facility];  Service: General;  Laterality: Left;  . RETINAL DETACHMENT SURGERY  1978   Left  . VAGINAL HYSTERECTOMY Bilateral 05/17/2014   Procedure: HYSTERECTOMY VAGINAL with Bilateral Salpingo-Oophorectomy; Bladder cystotomy repair;  Surgeon: Marvene Staff, MD;  Location: [facility];  Service: Gynecology;  Laterality: Bilateral;  . VAGINAL PROLAPSE REPAIR N/A 05/17/2014   Procedure: VAGINAL VAULT PROLAPSE AND GRAFT;  Surgeon: Reece Packer, MD;  Location: [facility];  Service: Urology;  Laterality: N/A;    SOCIAL HISTORY:  Social History   Socioeconomic History  . Marital status: Widowed    Spouse name: Not on file  . Number of children: 6  . Years of education: Not on file  . Highest education level: Not on file  Occupational History  . Occupation: Retired  Tobacco Use  . Smoking  status: Former Smoker    Packs/day: 1.00    Types: Cigarettes    Quit date: 04/28/1990    Years since quitting: 30.3  . Smokeless tobacco: Never Used  . Tobacco comment: quit 28 yrs ago  Substance and Sexual Activity  . Alcohol use: Yes    Alcohol/week: 1.0 standard drink    Types: 1 Glasses of wine per week    Comment: occ  . Drug use: No  . Sexual activity: Not Currently    Birth control/protection: Post-menopausal  Other Topics Concern  . Not on file  Social History Narrative  . Not on file   Social Determinants of Health   Financial Resource Strain:   . Difficulty of Paying Living Expenses: Not on file  Food Insecurity:   . Worried About Charity fundraiser in the Last Year: Not on file  . Ran Out of Food in the Last Year: Not on file  Transportation Needs:   . Lack of Transportation (Medical): Not on file  . Lack of Transportation (Non-Medical): Not on file  Physical Activity:   . Days of Exercise per Week: Not on file  . Minutes of Exercise per Session: Not on file  Stress:   .  Feeling of Stress : Not on file  Social Connections:   . Frequency of Communication with Friends and Family: Not on file  . Frequency of Social Gatherings with Friends and Family: Not on file  . Attends Religious Services: Not on file  . Active Member of Clubs or Organizations: Not on file  . Attends Archivist Meetings: Not on file  . Marital Status: Not on file  Intimate Partner Violence:   . Fear of Current or Ex-Partner: Not on file  . Emotionally Abused: Not on file  . Physically Abused: Not on file  . Sexually Abused: Not on file    FAMILY HISTORY:  Family History  Problem Relation Age of Onset  . Cancer Brother        spinal  . Heart attack Mother   . Heart attack Father   . Heart attack Son   . Heart attack Son   . Depression Son   . Multiple myeloma Daughter   . Anemia Daughter   . Colon cancer Neg Hx     CURRENT MEDICATIONS:  Current Outpatient  Medications  Medication Sig Dispense Refill  . acetaminophen (TYLENOL) 500 MG tablet Take 500-1,000 mg by mouth every 6 (six) hours as needed for mild pain.    Marland Kitchen aspirin EC 81 MG tablet Take 1 tablet (81 mg total) by mouth daily with breakfast. 90 tablet 3  . carboxymethylcellulose (REFRESH PLUS) 0.5 % SOLN Place 3-4 drops into both eyes 3 (three) times daily as needed (dry eyes). Preservative free    . clopidogrel (PLAVIX) 75 MG tablet Take 75 mg by mouth daily.     Marland Kitchen ezetimibe (ZETIA) 10 MG tablet Take 10 mg by mouth daily.    . metoprolol tartrate (LOPRESSOR) 25 MG tablet TAKE 1/2 TABLET BY MOUTH TWICE A DAY 90 tablet 3  . nitroGLYCERIN (NITROSTAT) 0.4 MG SL tablet Place 1 tablet (0.4 mg total) under the tongue every 5 (five) minutes as needed. 25 tablet 3  . rosuvastatin (CRESTOR) 5 MG tablet Take 1 tablet (5 mg) by mouth on Mondays, Wednesdays and Fridays. 36 tablet 3  . Travoprost, BAK Free, (TRAVATAN) 0.004 % SOLN ophthalmic solution Place 1 drop into both eyes at bedtime.      No current facility-administered medications for this visit.    ALLERGIES:  Allergies  Allergen Reactions  . Contrast Media [Iodinated Diagnostic Agents] Anaphylaxis    Adverse reaction; pt hyperventilating, c/o CP and SOB   . Lipitor [Atorvastatin] Other (See Comments)    Muscle aches, cramps, fatigue, weight loss/poor appetite  . Sulfamethoxazole Other (See Comments)    Reaction was years ago  . Sulfonamide Derivatives Other (See Comments)    Dizziness     PHYSICAL EXAM:  Performance status (ECOG): 1 - Symptomatic but completely ambulatory  Vitals:   08/14/20 0910  BP: 137/81  Pulse: (!) 103  Resp: 19  Temp: (!) 96.8 F (36 C)  SpO2: 98%   Wt Readings from Last 3 Encounters:  08/14/20 161 lb 6.4 oz (73.2 kg)  07/27/20 162 lb (73.5 kg)  07/13/20 166 lb (75.3 kg)   Physical Exam Vitals reviewed.  Constitutional:      Appearance: Normal appearance.  Neurological:     General: No focal  deficit present.     Mental Status: She is alert and oriented to person, place, and time.  Psychiatric:        Mood and Affect: Mood normal.  Behavior: Behavior normal.      LABORATORY DATA:  I have reviewed the labs as listed.  CBC Latest Ref Rng & Units 07/24/2020 07/24/2020 09/01/2019  WBC 4.0 - 10.5 K/uL - 10.5 8.4  Hemoglobin 12.0 - 15.0 g/dL 13.9 13.0 12.9  Hematocrit 36 - 46 % 41.0 41.5 40.5  Platelets 150 - 400 K/uL - 226 263   CMP Latest Ref Rng & Units 07/24/2020 07/24/2020 09/01/2019  Glucose 70 - 99 mg/dL 90 94 101(H)  BUN 8 - 23 mg/dL 19 20 11   Creatinine 0.44 - 1.00 mg/dL 0.70 0.74 0.73  Sodium 135 - 145 mmol/L 141 137 140  Potassium 3.5 - 5.1 mmol/L 4.2 4.0 4.1  Chloride 98 - 111 mmol/L 102 103 106  CO2 22 - 32 mmol/L - 24 25  Calcium 8.9 - 10.3 mg/dL - 9.0 8.9  Total Protein 6.5 - 8.1 g/dL - 7.3 -  Total Bilirubin 0.3 - 1.2 mg/dL - 0.6 -  Alkaline Phos 38 - 126 U/L - 68 -  AST 15 - 41 U/L - 18 -  ALT 0 - 44 U/L - 16 -    DIAGNOSTIC IMAGING:  I have independently reviewed the scans and discussed with the patient. MR BRAIN WO CONTRAST  Result Date: 07/24/2020 CLINICAL DATA:  Neuro deficit, acute, stroke suspected. EXAM: MRI HEAD WITHOUT CONTRAST TECHNIQUE: Multiplanar, multiecho pulse sequences of the brain and surrounding structures were obtained without intravenous contrast. COMPARISON:  Noncontrast head CT performed earlier the same day 07/24/2020. Prior head CT examination 12/10/2018 and earlier. FINDINGS: Brain: Mild intermittent motion degradation. Mild generalized parenchymal atrophy. Moderate scattered T2/FLAIR hyperintensity within the cerebral white matter is nonspecific, but consistent with chronic small vessel ischemic disease. Small chronic infarcts within the cerebellar hemispheres bilaterally. There is no acute infarct. No evidence of intracranial mass. No chronic intracranial blood products. No extra-axial fluid collection. No midline shift. Vascular:  Expected proximal arterial flow voids. Skull and upper cervical spine: No focal marrow lesion. Sinuses/Orbits: Left scleral buckle. No acute orbital abnormality. No significant paranasal sinus disease or mastoid effusion. IMPRESSION: Mildly motion degraded examination. No evidence of acute intracranial abnormality, including acute infarction. Moderate chronic small vessel ischemic changes within cerebral white matter. 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 Mild generalized parenchymal atrophy. Electronically Signed   By: Kellie Simmering DO   On: 07/24/2020 15:12   US Carotid Bilateral  Result Date: 07/25/2020 CLINICAL DATA:  Syncope and collapse. Right lower extremity weakness. History of CAD, hypertension and hyperlipidemia. Former smoker. EXAM: BILATERAL CAROTID DUPLEX ULTRASOUND TECHNIQUE: Pearline Cables scale imaging, color Doppler and duplex ultrasound were performed of bilateral carotid and vertebral arteries in the neck. COMPARISON:  None. FINDINGS: Criteria: Quantification of carotid stenosis is based on velocity parameters that correlate the residual internal carotid diameter with NASCET-based stenosis levels, using the diameter of the distal internal carotid lumen as the denominator for stenosis measurement. The following velocity measurements were obtained: RIGHT ICA: 77/21 cm/sec CCA: 03/55 cm/sec SYSTOLIC ICA/CCA RATIO:  0.9 ECA: 304 cm/sec LEFT ICA: 64/20 cm/sec CCA: 97/4 cm/sec SYSTOLIC ICA/CCA RATIO:  0.8 ECA: 57 cm/sec RIGHT CAROTID ARTERY: There is a minimal amount of eccentric echogenic plaque scattered throughout the right common carotid artery (representative image 8). There is a minimal to moderate amount of eccentric echogenic plaque within the right carotid bulb (images 16 and 18). There is a large amount of eccentric echogenic plaque involving the origin and proximal aspects the right internal carotid artery (image 29),  morphologically resulting in 50% luminal narrowing  though not resulting in elevated peak systolic velocities within the interrogated course of the right internal carotid artery to suggest a hemodynamically significant stenosis RIGHT VERTEBRAL ARTERY:  Antegrade flow LEFT CAROTID ARTERY: There is a minimal amount of circumferential intimal thickening throughout the left common carotid artery (images 43, 48 and 52). There is a minimal amount of eccentric echogenic plaque within the left carotid bulb (images 57 and 58), extending to involve the origin and proximal aspects of the left internal carotid artery (image 67), not resulting in elevated peak systolic velocities within the interrogated course of the left internal carotid artery to suggest a hemodynamically significant stenosis. LEFT VERTEBRAL ARTERY:  Antegrade flow IMPRESSION: 1. Large amount of right-sided atherosclerotic plaque morphologically results in at least 50% luminal narrowing though does not result in elevated peak systolic velocities with the right internal carotid artery to suggest a hemodynamically significant stenosis. If clinical concern persists, further evaluation with CTA could as clinically indicated. 2. Minimal amount of left-sided atherosclerotic plaque, not resulting in a hemodynamically significant stenosis. Electronically Signed   By: Sandi Mariscal M.D.   On: 07/25/2020 09:45   NM PET Image Initial (PI) Skull Base To Thigh  Result Date: 08/08/2020 CLINICAL DATA:  Subsequent treatment strategy for left sided lumpectomy 7 years ago for breast cancer. Restaging. Elevated Cea. COVID vaccine in left arm 6/21. EXAM: NUCLEAR MEDICINE PET SKULL BASE TO THIGH TECHNIQUE: 9.2 mCi F-18 FDG was injected intravenously. Full-ring PET imaging was performed from the skull base to thigh after the radiotracer. CT data was obtained and used for attenuation correction and anatomic localization. Fasting blood glucose: 92 mg/dl COMPARISON:  11/19/2016 abdominopelvic CT. CTA chest 09/01/2016. FINDINGS:  Mediastinal blood pool activity: SUV max 2.1 Liver activity: SUV max NA NECK: No areas of abnormal hypermetabolism. Incidental CT findings: No cervical adenopathy. Advanced bilateral carotid atherosclerosis. Surgical changes about the left globe. CHEST: Hypermetabolic thoracic adenopathy. Right paratracheal 1.5 cm node measures a S.U.V. max of 5.9 on 92/3. Adenopathy within the prevascular and left internal mammary spaces including at 2.4 x 2.1 cm and a S.U.V. max of 6.9 on 98/3. Incidental CT findings: Aortic and coronary artery atherosclerosis. Mild cardiomegaly. Centrilobular emphysema. ABDOMEN/PELVIS: No abdominopelvic parenchymal or nodal hypermetabolism. Incidental CT findings: Normal adrenal glands. Cholecystectomy. Abdominal aortic atherosclerosis. Hysterectomy. Pelvic floor laxity. SKELETON: Widespread hypermetabolic osseous metastasis. Right posterior third rib expansile hypermetabolic lesion on the order of 3.8 cm and a S.U.V. max of 8.5 on 78/3. A T1 lytic lesion with probable extension into the canal measures on the order of 2.8 cm and a S.U.V. max of 10.8 on 64/3. Right femoral neck lytic lesion measures 1.8 cm and a S.U.V. max of 7.5 on 295/3. A sacral osseous and soft tissue density lesion in the midline measures 2.6 x 2.0 cm and a S.U.V. max of 7.2 on 230/3. Incidental CT findings: none IMPRESSION: 1. Widespread hypermetabolic osseous metastasis, including lesions with soft tissue components at T1 and the sacrum. Consider pre and postcontrast thoracic spine MRI. Lumbosacral spine imaging could be performed based on clinical symptoms. 2. Thoracic nodal metastasis. No other evidence of soft tissue metastasis. 3. Incidental findings, including: Aortic atherosclerosis (ICD10-I70.0), coronary artery atherosclerosis and emphysema (ICD10-J43.9). Pelvic floor laxity. Electronically Signed   By: Abigail Miyamoto M.D.   On: 08/08/2020 11:28   ECHOCARDIOGRAM COMPLETE  Result Date: 07/24/2020    ECHOCARDIOGRAM  REPORT   Patient Name:   Arantxa Jordan Likes Date of Exam:  07/24/2020 Medical Rec #:  [id number]        Height:       66.0 in Accession #:    [id number]       Weight:       166.0 lb Date of Birth:  22-Jul-1933        BSA:          1.848 m Patient Age:    15 years         BP:           130/100 mmHg Patient Gender: F                HR:           74 bpm. Exam Location:  [facility] Procedure: 2D Echo, Cardiac Doppler and Color Doppler Indications:    TIA 435.9 / G45.9  History:        Patient has prior history of Echocardiogram examinations, most                 recent 04/03/2018. CAD; Risk Factors:Hypertension and                 Dyslipidemia. Cancer left breast.  Sonographer:    Alvino Chapel RCS Referring Phys: [id number] COURAGE EMOKPAE IMPRESSIONS  1. Left ventricular ejection fraction, by estimation, is 60 to 65%. The left ventricle has normal function. The left ventricle has no regional wall motion abnormalities. Left ventricular diastolic parameters are consistent with Grade I diastolic dysfunction (impaired relaxation).  2. Right ventricular systolic function is normal. The right ventricular size is normal. There is normal pulmonary artery systolic pressure.  3. Left atrial size was mildly dilated.  4. The mitral valve is normal in structure. Trivial mitral valve regurgitation. No evidence of mitral stenosis.  5. The aortic valve is tricuspid. Aortic valve regurgitation is not visualized. No aortic stenosis is present.  6. The inferior vena cava is normal in size with greater than 50% respiratory variability, suggesting right atrial pressure of 3 mmHg. FINDINGS  Left Ventricle: Left ventricular ejection fraction, by estimation, is 60 to 65%. The left ventricle has normal function. The left ventricle has no regional wall motion abnormalities. The left ventricular internal cavity size was normal in size. There is  no left ventricular hypertrophy. Left ventricular diastolic parameters are consistent with Grade I diastolic  dysfunction (impaired relaxation). Normal left ventricular filling pressure. Right Ventricle: The right ventricular size is normal. No increase in right ventricular wall thickness. Right ventricular systolic function is normal. There is normal pulmonary artery systolic pressure. The tricuspid regurgitant velocity is 2.26 m/s, and  with an assumed right atrial pressure of 3 mmHg, the estimated right ventricular systolic pressure is 38.3 mmHg. Left Atrium: Left atrial size was mildly dilated. Right Atrium: Right atrial size was normal in size. Pericardium: There is no evidence of pericardial effusion. Mitral Valve: The mitral valve is normal in structure. Trivial mitral valve regurgitation. No evidence of mitral valve stenosis. Tricuspid Valve: The tricuspid valve is normal in structure. Tricuspid valve regurgitation is mild . No evidence of tricuspid stenosis. Aortic Valve: The aortic valve is tricuspid. Aortic valve regurgitation is not visualized. No aortic stenosis is present. Aortic valve mean gradient measures 3.1 mmHg. Aortic valve peak gradient measures 5.6 mmHg. Aortic valve area, by VTI measures 1.93 cm. Pulmonic Valve: The pulmonic valve was not well visualized. Pulmonic valve regurgitation is not visualized. No evidence of pulmonic stenosis. Aorta: The aortic root is normal in size  and structure. Venous: The inferior vena cava is normal in size with greater than 50% respiratory variability, suggesting right atrial pressure of 3 mmHg. IAS/Shunts: No atrial level shunt detected by color flow Doppler.  LEFT VENTRICLE PLAX 2D LVIDd:         4.49 cm  Diastology LVIDs:         3.10 cm  LV e' lateral:   5.55 cm/s LV PW:         0.93 cm  LV E/e' lateral: 9.7 LV IVS:        0.95 cm  LV e' medial:    3.70 cm/s LVOT diam:     2.00 cm  LV E/e' medial:  14.5 LV SV:         51 LV SV Index:   28 LVOT Area:     3.14 cm  RIGHT VENTRICLE RV S prime:     11.60 cm/s TAPSE (M-mode): 2.0 cm LEFT ATRIUM             Index        RIGHT ATRIUM           Index LA diam:        3.40 cm 1.84 cm/m  RA Area:     12.60 cm LA Vol (A2C):   53.4 ml 28.90 ml/m RA Volume:   25.70 ml  13.91 ml/m LA Vol (A4C):   53.9 ml 29.17 ml/m LA Biplane Vol: 53.6 ml 29.01 ml/m  AORTIC VALVE AV Area (Vmax):    1.62 cm AV Area (Vmean):   1.71 cm AV Area (VTI):     1.93 cm AV Vmax:           118.71 cm/s AV Vmean:          84.748 cm/s AV VTI:            0.266 m AV Peak Grad:      5.6 mmHg AV Mean Grad:      3.1 mmHg LVOT Vmax:         61.40 cm/s LVOT Vmean:        46.000 cm/s LVOT VTI:          0.163 m LVOT/AV VTI ratio: 0.61  AORTA Ao Root diam: 3.30 cm MITRAL VALVE               TRICUSPID VALVE MV Area (PHT): 2.44 cm    TR Peak grad:   20.4 mmHg MV Decel Time: 311 msec    TR Vmax:        226.00 cm/s MV E velocity: 53.80 cm/s MV A velocity: 82.30 cm/s  SHUNTS MV E/A ratio:  0.65        Systemic VTI:  0.16 m                            Systemic Diam: 2.00 cm Carlyle Dolly MD Electronically signed by Carlyle Dolly MD Signature Date/Time: 07/24/2020/4:11:09 PM    Final    CT HEAD CODE STROKE WO CONTRAST  Result Date: 07/24/2020 CLINICAL DATA:  Code stroke.  Abnormal speech, right-sided weakness EXAM: CT HEAD WITHOUT CONTRAST TECHNIQUE: Contiguous axial images were obtained from the base of the skull through the vertex without intravenous contrast. COMPARISON:  12/10/2018 FINDINGS: Brain: There is no acute intracranial hemorrhage, mass effect, or edema. No new loss of gray-white differentiation. Patchy areas of hypoattenuation in the supratentorial white matter probably reflects stable chronic microvascular  ischemic changes. Prominence of the ventricles and sulci reflects minor generalized parenchymal volume loss. There is no extra-axial fluid collection. Vascular: No hyperdense vessel. Intracranial atherosclerotic calcification is present at the skull base. Skull: Unremarkable. Sinuses/Orbits: No acute abnormality. Other: Mastoid air cells are clear. ASPECTS  ([facility] Stroke Program Early CT Score) - Ganglionic level infarction (caudate, lentiform nuclei, internal capsule, insula, M1-M3 cortex): 7 - Supraganglionic infarction (M4-M6 cortex): 3 Total score (0-10 with 10 being normal): 10 IMPRESSION: No acute intracranial hemorrhage or evidence of acute infarction. ASPECT score is 10. Chronic microvascular ischemic changes. These results were called by telephone at the time of interpretation on 07/24/2020 at 12:18 pm to provider JOSHUA LONG , who verbally acknowledged these results. Electronically Signed   By: Macy Mis M.D.   On: 07/24/2020 12:21     ASSESSMENT:  1.  T2N0 left breast infiltrating lobular carcinoma: -Status post lumpectomy in June 2014, 1.4 cm, grade 1, lymphovascular invasion positive, ER 100%, PR 26%, Ki-67 18%. -She underwent XRT.  She did not take adjuvant endocrine therapy. -Mammogram on 07/13/2020, BI-RADS Category 1. -CEA was 10.3, Ca1 2515.8 and CA 19-9 05.  2.  Social/family history: -She quit smoking 20 years ago, smoked 2 packs/day for more than 20 years prior to quitting. -She lives at home by herself and is independent of all activities. -Daughter who has multiple myeloma at age 83.   PLAN:  1.  Metastatic cancer to the bones, highly likely breast cancer: -We reviewed images of the PET scan and reports with the patient and her 2 daughters in detail. -There is widespread skeletal meta stasis including lesions with soft tissue components at T1 and sacrum.  Some lymphadenopathy in the mediastinum present.  No visceral metastatic disease. -Given her prior history of breast cancer, this is likely metastatic breast cancer. -I have recommended biopsy with receptor studies. -If ER positive breast cancer confirmed, potential treatment options include aromatase inhibitor and CDK 4/6 inhibitor. -Given the uptake in the spine, I have recommended thoracic and lumbar MRI with and without contrast.  We will make a referral to IR  for biopsy.  2.  CAD and RCA stenting: -Continue metoprolol and Plavix.  3.  Right lateral rib pain and back pain: -She is taking Tylenol which is not helping.  Pain is reported as sharp and constant. -We will start her on Norco 5/325 every 4-6 hours as needed. -Would consider radiation therapy if there is no improvement for better pain control.  4.  Bone metastasis: -We will talk about bisphosphonates once pathology is available.   Orders placed this encounter:  No orders of the defined types were placed in this encounter.    Derek Jack, MD [place] [phone]   I, Milinda Antis, am acting as a scribe for Dr. Sanda Linger.  I, Derek Jack MD, have reviewed the above documentation for accuracy and completeness, and I agree with the above.

## 2020-08-14 NOTE — Progress Notes (Signed)
Kara Woods Female, 86 y.o., 01/19/1933 MRN:  [id number] Phone:  [phone] (H) PCP:  Celene Squibb, MD Primary Cvg:  Medicare/Medicare Part A And B Next Appt With Radiology ([facility]-MR 1) 08/15/2020 at 3:00 PM  RE: CT Biopsy Received: Today Derek Jack, MD  Jillyn Hidden Ok to hold.  thanks       Previous Messages   ----- Message -----  From: Garth Bigness D  Sent: 08/14/2020 11:56 AM EDT  To: Derek Jack, MD  Subject: FW: CT Biopsy                   Patient is on Plavix and will need to hold for 5 days prior to her Biopsy. Please advise if ok to hold. Thanks Aniceto Boss  ----- Message -----  From: Garth Bigness D  Sent: 08/14/2020 11:47 AM EDT  To: Ir Procedure Requests  Subject: CT Biopsy                     Procedure:  CT Biopsy   Reason: Malignant neoplasm of upper-inner quadrant of left breast in female, estrogen receptor positive, breast cancer, mets on recent PET scan   History: MR, [place], NM PET in computer   Provider: Derek Jack   Provider Contact: [phone]

## 2020-08-14 NOTE — Progress Notes (Signed)
Kara Woods Female, 86 y.o., 03-10-1933 MRN:  [id number] Phone:  [phone] (H) PCP:  Celene Squibb, MD Primary Cvg:  Medicare/Medicare Part A And B Next Appt With Radiology ([facility]-MR 1) 08/15/2020 at 3:00 PM  RE: CT Biopsy Received: Today Arne Cleveland, MD  Jillyn Hidden Ok   CT core R 3rd rib expansile met   DDH       Previous Messages   ----- Message -----  From: Garth Bigness D  Sent: 08/14/2020 11:47 AM EDT  To: Ir Procedure Requests  Subject: CT Biopsy                     Procedure:  CT Biopsy   Reason: Malignant neoplasm of upper-inner quadrant of left breast in female, estrogen receptor positive, breast cancer, mets on recent PET scan   History: MR, [place], NM PET in computer   Provider: Derek Jack   Provider Contact: [phone]

## 2020-08-15 ENCOUNTER — Ambulatory Visit (HOSPITAL_COMMUNITY)
Admission: RE | Admit: 2020-08-15 | Discharge: 2020-08-15 | Disposition: A | Discharge status: Home / Self Care | Payer: Medicare Other | Source: Ambulatory Visit | Attending: Hematology | Admitting: Hematology

## 2020-08-15 ENCOUNTER — Other Ambulatory Visit (HOSPITAL_COMMUNITY): Payer: Self-pay | Admitting: Hematology

## 2020-08-15 ENCOUNTER — Other Ambulatory Visit (HOSPITAL_COMMUNITY): Payer: Self-pay | Admitting: *Deleted

## 2020-08-15 DIAGNOSIS — M4319 Spondylolisthesis, multiple sites in spine: Secondary | ICD-10-CM | POA: Diagnosis not present

## 2020-08-15 DIAGNOSIS — Z170 Estrogen receptor positive status [ER+]: Secondary | ICD-10-CM

## 2020-08-15 DIAGNOSIS — M5124 Other intervertebral disc displacement, thoracic region: Secondary | ICD-10-CM | POA: Diagnosis not present

## 2020-08-15 DIAGNOSIS — C50919 Malignant neoplasm of unspecified site of unspecified female breast: Secondary | ICD-10-CM | POA: Diagnosis not present

## 2020-08-15 DIAGNOSIS — C50212 Malignant neoplasm of upper-inner quadrant of left female breast: Secondary | ICD-10-CM | POA: Insufficient documentation

## 2020-08-15 DIAGNOSIS — M5126 Other intervertebral disc displacement, lumbar region: Secondary | ICD-10-CM | POA: Diagnosis not present

## 2020-08-15 DIAGNOSIS — C7951 Secondary malignant neoplasm of bone: Secondary | ICD-10-CM | POA: Diagnosis not present

## 2020-08-15 DIAGNOSIS — M8448XA Pathological fracture, other site, initial encounter for fracture: Secondary | ICD-10-CM | POA: Diagnosis not present

## 2020-08-15 IMAGING — MR MR LUMBAR SPINE W/O CM
4 of 5 series · 18 of 48 positions shown · non-contrast
Comparison: PET-CT [DATE].  Abdominopelvic CT [DATE].

CLINICAL DATA: Metastatic breast cancer with known osseous
metastases on PET-CT.

EXAM:
MRI LUMBAR SPINE WITHOUT CONTRAST
TECHNIQUE: Multiplanar, multisequence MR imaging of the lumbar spine was
performed. No intravenous contrast was administered. Patient refused
contrast.

[Series 5: T2 · sagittal · 4.0mm · 0.55mm/px · 8 of 20 slices shown (1 of 2)]
[im 1/20]
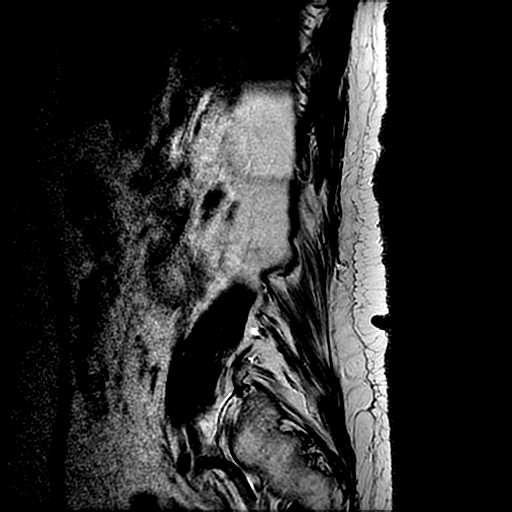
[im 3/20]
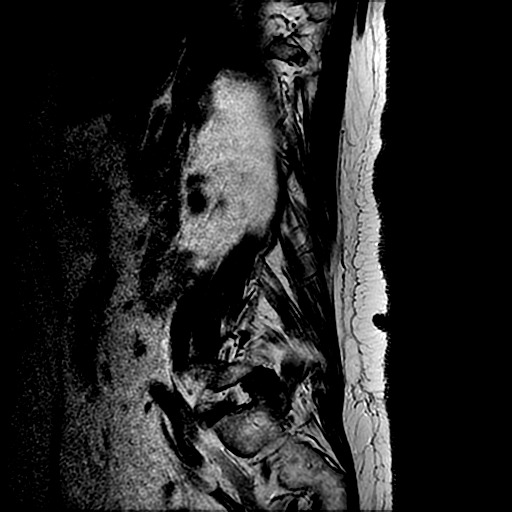
[im 6/20]
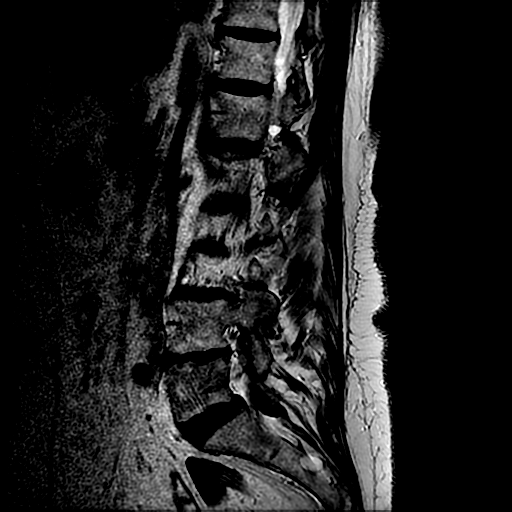
[im 9/20]
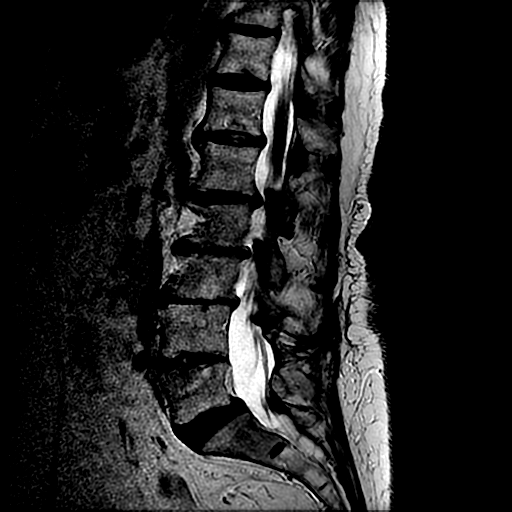
[im 11/20]
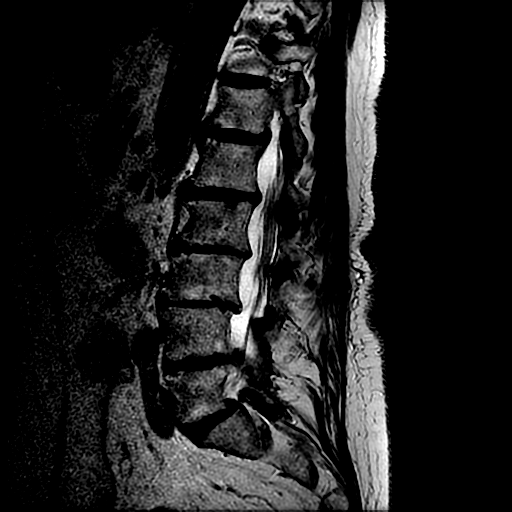
[im 14/20]
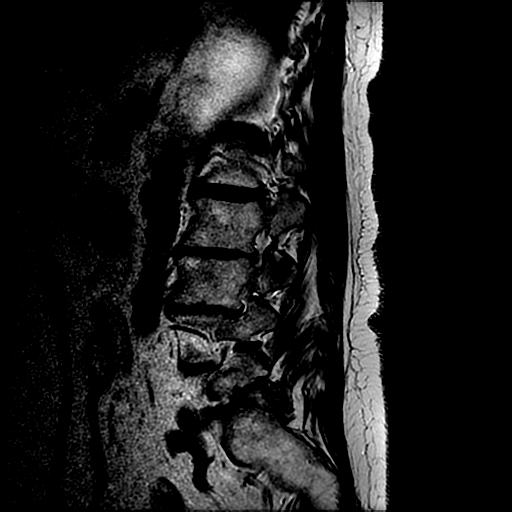
[im 17/20]
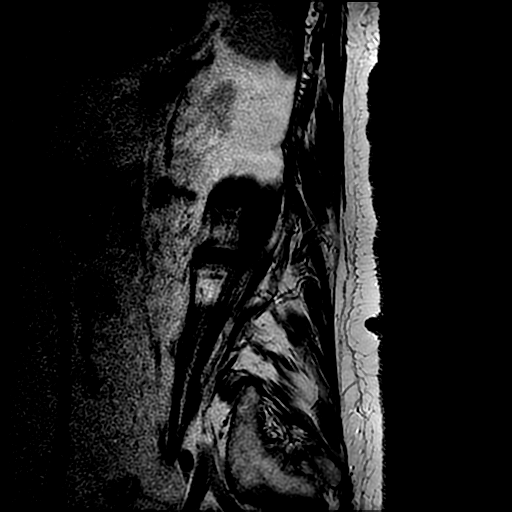
[im 20/20]
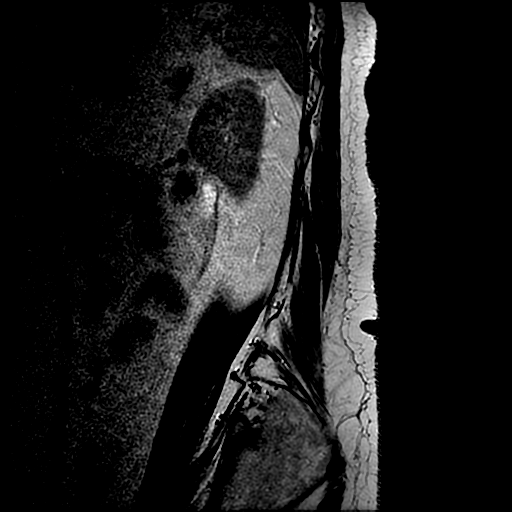

[Series 6: T1 · sagittal · 4.0mm · 0.55mm/px · 3 of 20 slices shown (1 of 2)]
[im 4/20]
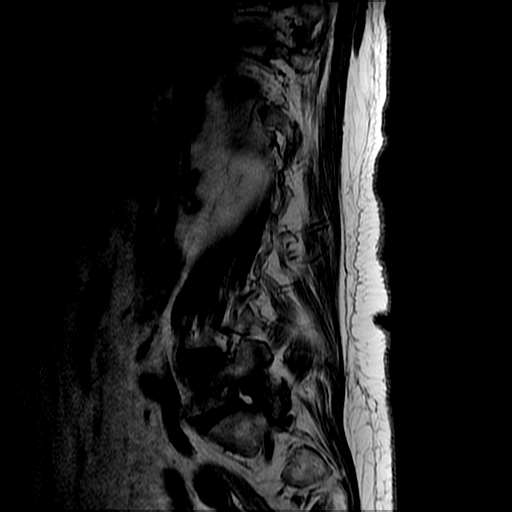
[im 10/20]
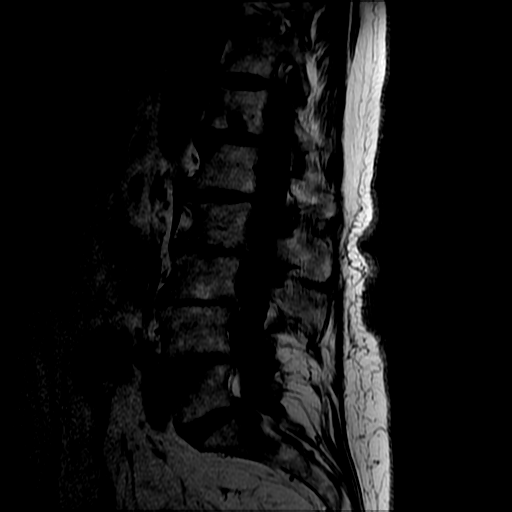
[im 16/20]
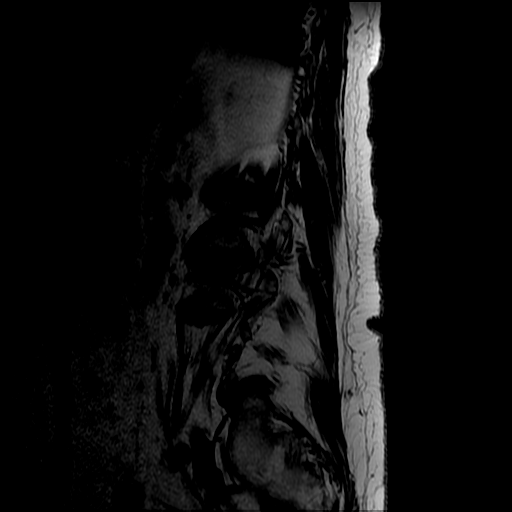

[Series 8: T2 · axial · 4.0mm · 0.39mm/px · z∈[-436,-287]mm · 4 of 35 slices shown (2 of 2)]
[im 1/35]
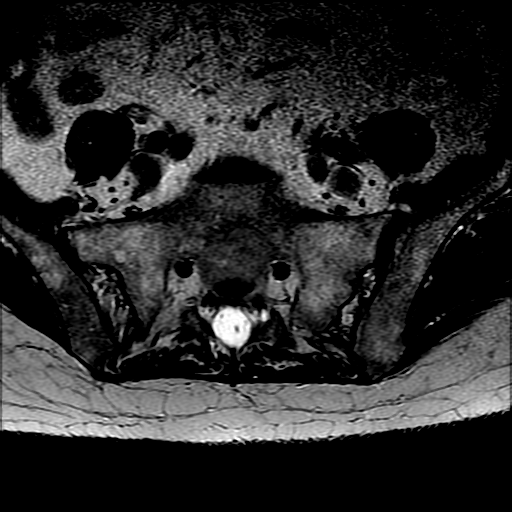
[im 6/35]
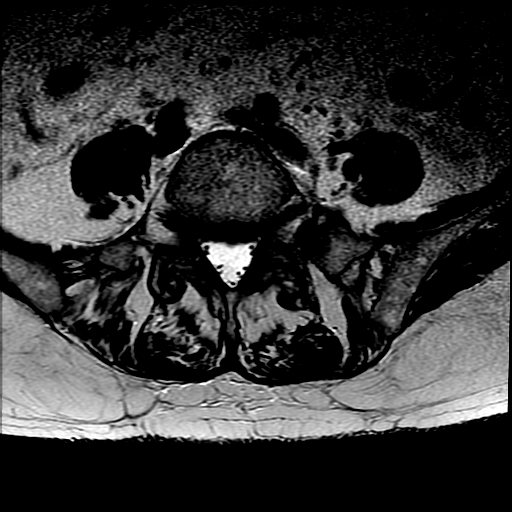
[im 18/35]
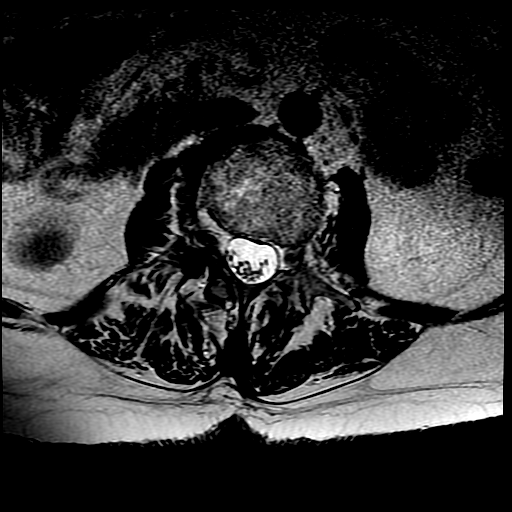
[im 29/35]
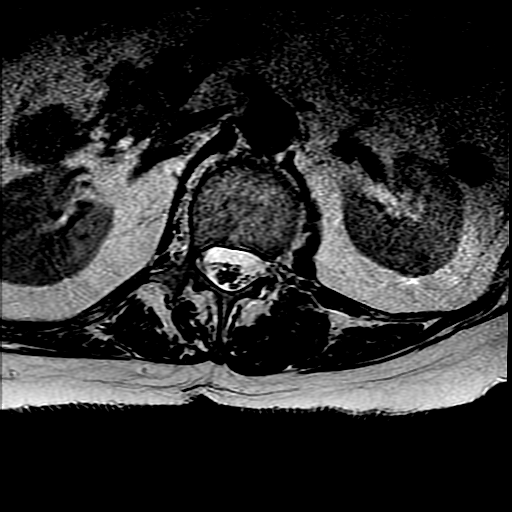

[Series 9: T1 · axial · 4.0mm · 0.39mm/px · z∈[-411,-287]mm · 3 of 35 slices shown (2 of 2)]
[im 6/35]
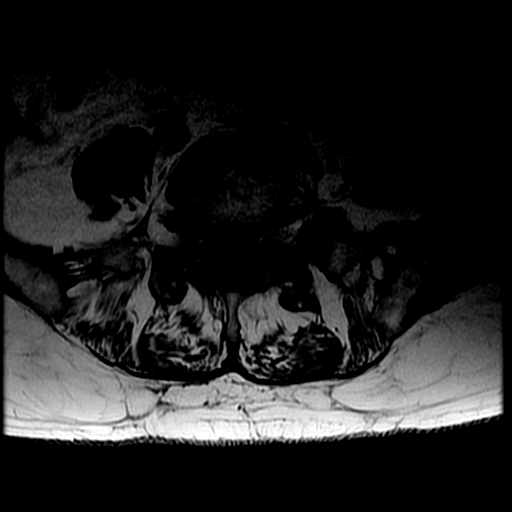
[im 18/35]
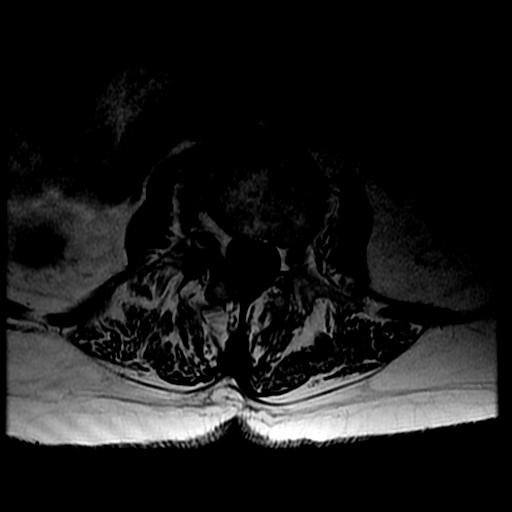
[im 29/35]
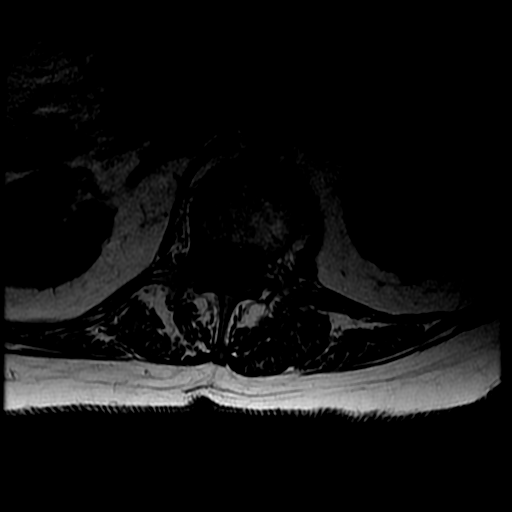

[18 of 48 positions shown; findings below may reference images not displayed]

FINDINGS: Segmentation: Conventional anatomy assumed, with the last open disc
space designated L5-S1.Concordant with previous imaging.

Alignment: Convex left scoliosis centered at L2-3. Degenerative
grade 1 anterolisthesis at L5-S1.

Vertebrae: As seen on recent PET-CT, there are multiple osseous
metastases within the lumbar spine and upper sacrum. Lesions are
noted within the left L1 pedicle, the left aspect of the L3
vertebral body, the central aspect of the L4 vertebral body, the
right aspect of the L5 vertebral body and the upper sacrum. No
evidence of pathologic fracture or epidural tumor. Lesions in the
iliac bones are partially imaged. The sacroiliac joints appear
unremarkable.

Conus medullaris: Extends to the L1-2 level and appears normal.

Paraspinal and other soft tissues: No significant paraspinal
findings.

Disc levels:

T12-L1: Loss of disc height with annular disc bulging and endplate
osteophytes. No significant spinal stenosis or nerve root
encroachment.

L1-2: Loss of disc height with annular disc bulging and endplate
osteophytes asymmetric to the right. Asymmetric right-sided facet
hypertrophy. Resulting mild to moderate right lateral recess and
right foraminal narrowing.

L2-3: Loss of disc height with annular disc bulging and endplate
osteophytes asymmetric to the right. Mild bilateral facet
hypertrophy. Mild right lateral recess and right foraminal
narrowing.

L3-4: Chronic degenerative disc disease with loss of disc height,
annular disc bulging and endplate osteophytes. Mild bilateral facet
hypertrophy. Mild narrowing of the lateral recesses and foramina
bilaterally.

L4-5: Previous posterior decompression with wide patency of the
spinal canal and lateral recesses. Chronic degenerative disc disease
with loss of disc height and endplate osteophytes. Mild foraminal
narrowing bilaterally.

L5-S1: Relatively preserved disc height with mild disc bulging and
moderate bilateral facet hypertrophy. Mild foraminal narrowing
bilaterally.
IMPRESSION: 1. Multilevel osseous metastases within the lumbar spine and upper
pelvis as seen on recent PET-CT. No evidence of pathologic fracture
or epidural tumor.
2. Multilevel spondylosis with disc bulging, endplate osteophytes
and facet hypertrophy as described. No high-grade spinal stenosis.
Mild to moderate lateral recess and foraminal narrowing as detailed
above.
3. Previous posterior decompression at L4-5.

## 2020-08-15 IMAGING — MR MR THORACIC SPINE W/O CM
5 series · 32 of 48 positions shown · non-contrast
Comparison: PET-CT [DATE].

CLINICAL DATA: Breast cancer staging. Multiple osseous metastases
on recent PET-CT, with involvement of the thoracic spine.

EXAM:
MRI THORACIC SPINE WITHOUT CONTRAST
TECHNIQUE: Multiplanar, multisequence MR imaging of the thoracic spine was
performed. No intravenous contrast was administered. Patient refused
contrast.

[Series 5: T1 · sagittal · 3.0mm · 0.62mm/px · 7 of 16 slices shown]
[im 1/16]
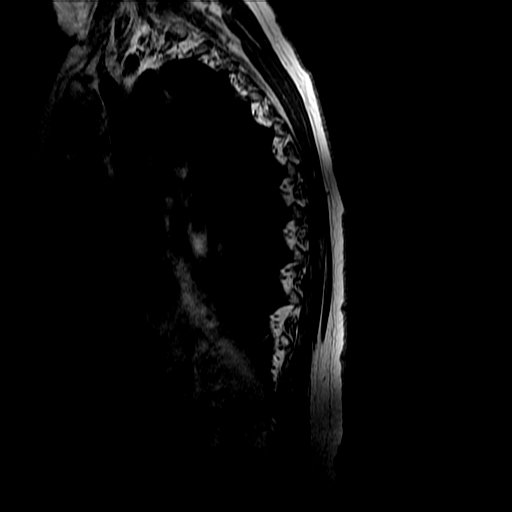
[im 3/16]
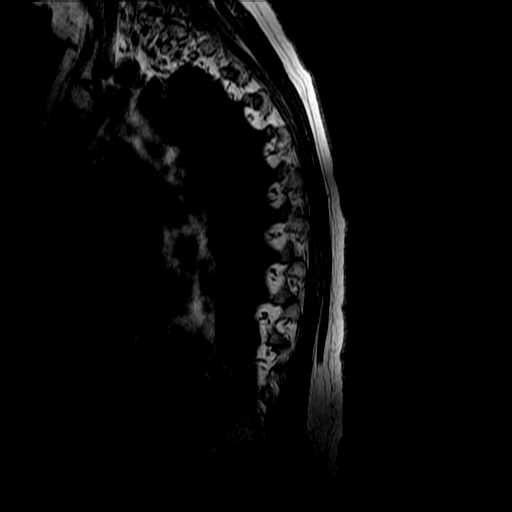
[im 6/16]
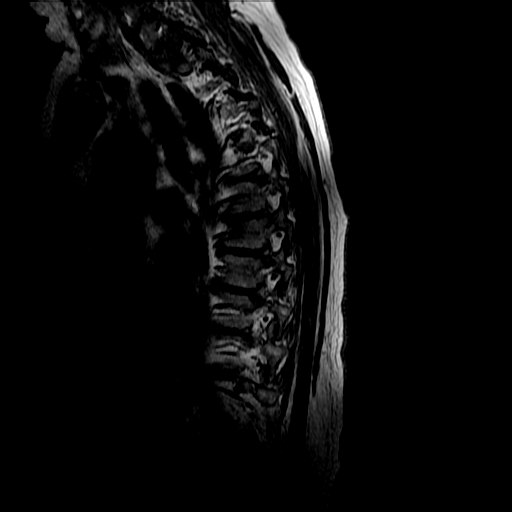
[im 8/16]
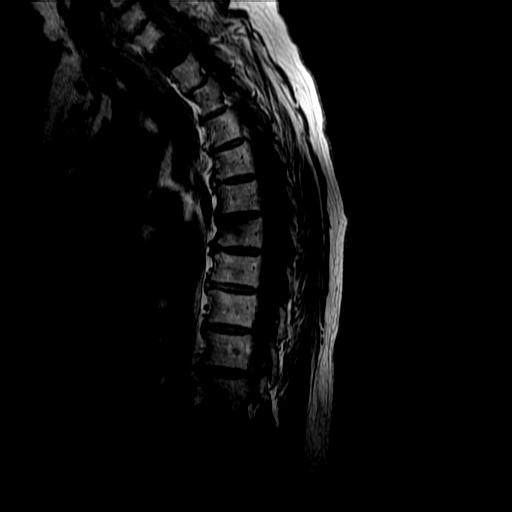
[im 11/16]
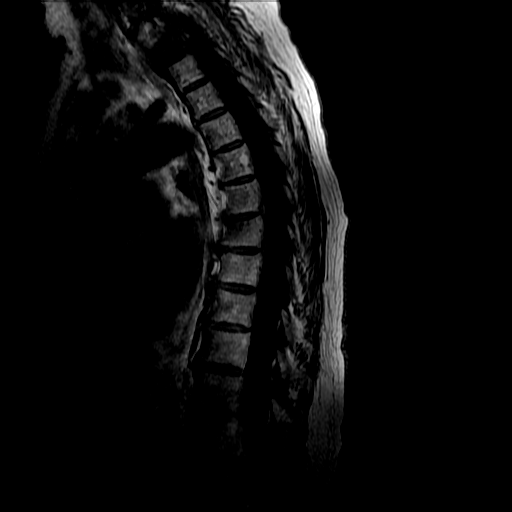
[im 13/16]
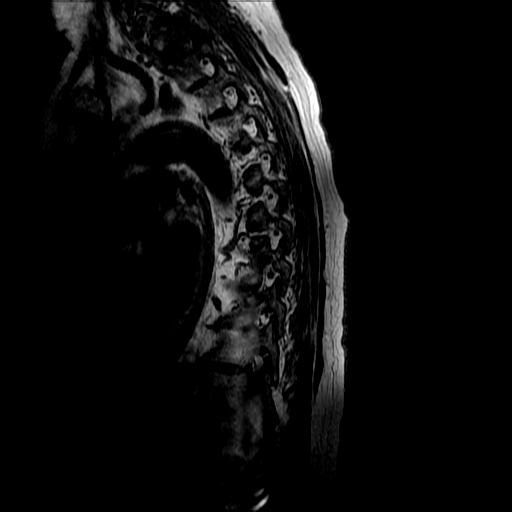
[im 16/16]
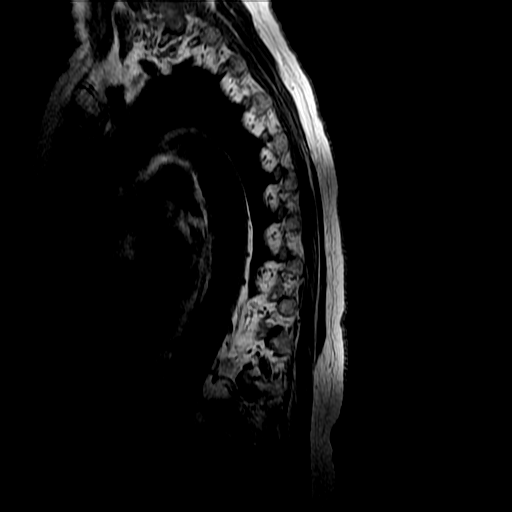

[Series 6: sag ir · sagittal · 3.0mm · 0.62mm/px · 6 of 16 slices shown]
[im 1/16]
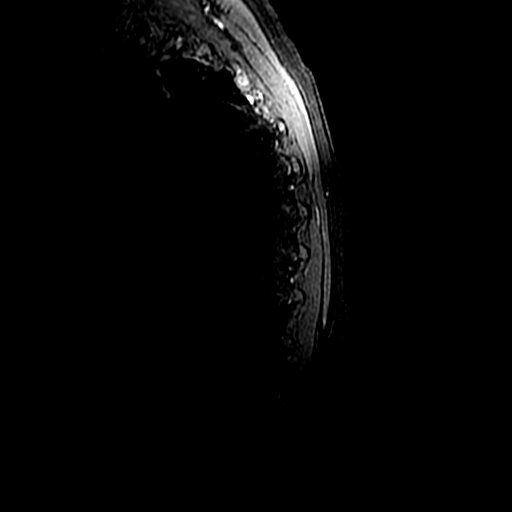
[im 4/16]
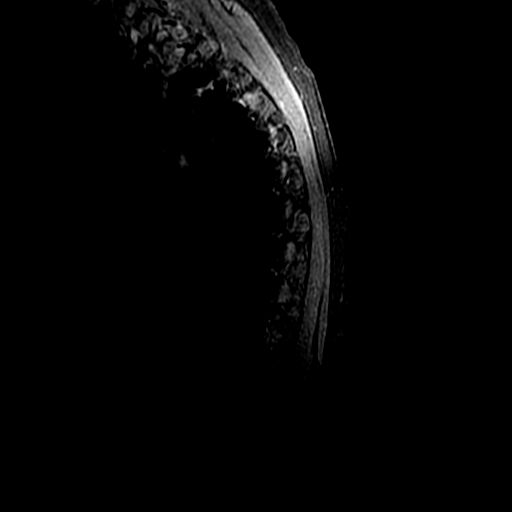
[im 7/16]
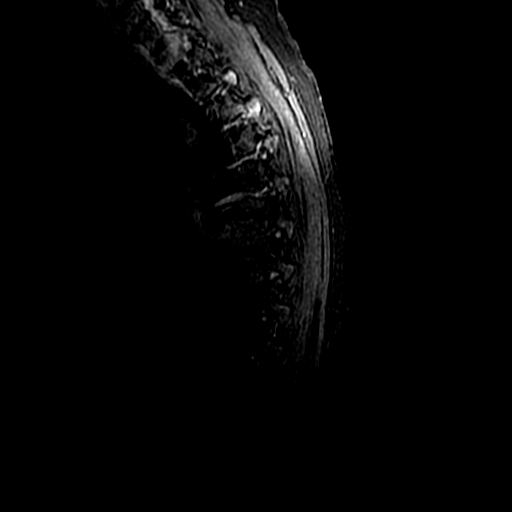
[im 10/16]
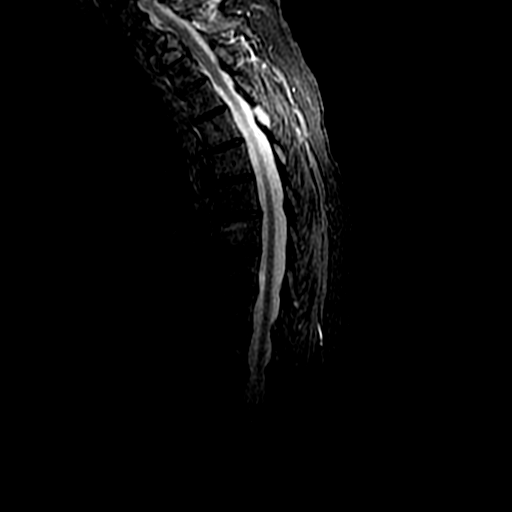
[im 13/16]
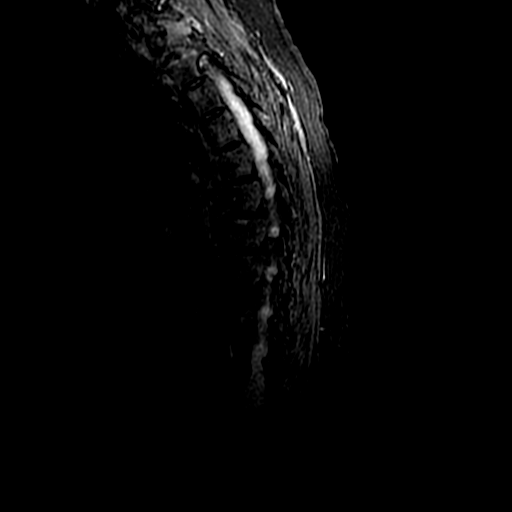
[im 16/16]
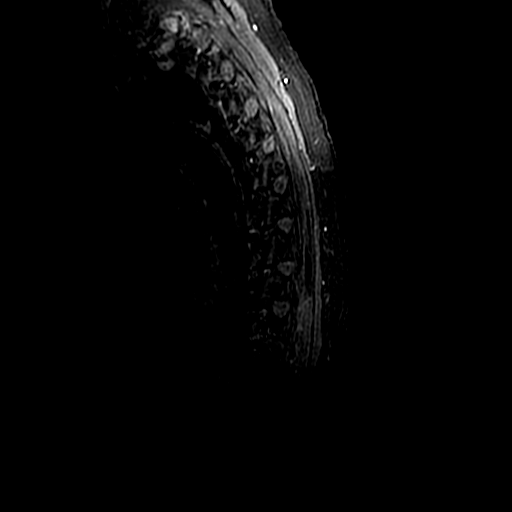

[Series 8: axial 2d merge · axial · 4.0mm · 0.86mm/px · z∈[-234,-125]mm · 4 of 36 slices shown]
[im 1/36]
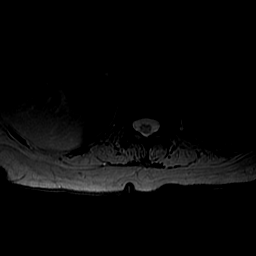
[im 6/36]
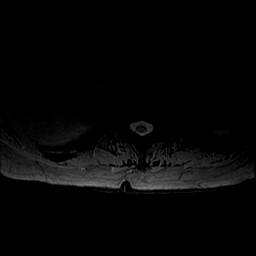
[im 11/36]
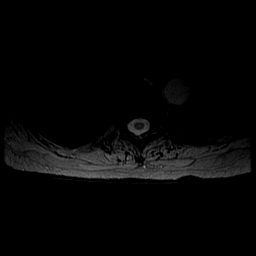
[im 17/36]
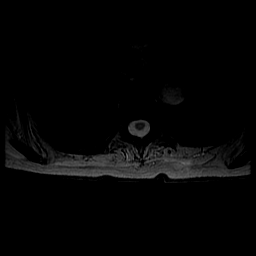

[Series 10: T2 · axial · 4.0mm · 0.86mm/px · z∈[-266,-55]mm · 9 of 39 slices shown (1 of 2)]
[im 1/39]
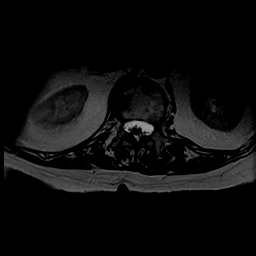
[im 6/39]
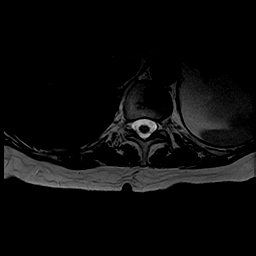
[im 11/39]
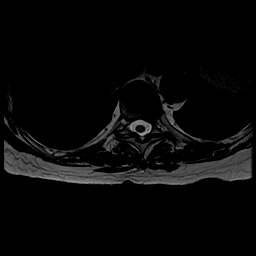
[im 17/39]
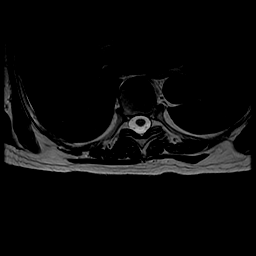
[im 20/39]
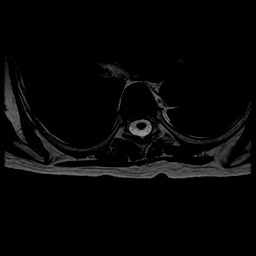
[im 22/39]
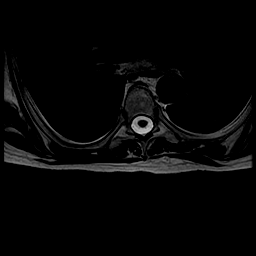
[im 28/39]
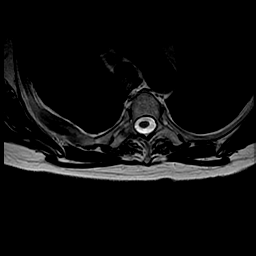
[im 33/39]
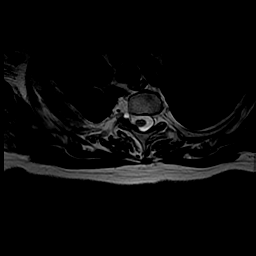
[im 39/39]
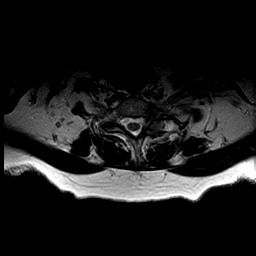

[Series 11: T2 · sagittal · 3.0mm · 1.25mm/px · 6 of 16 slices shown (2 of 2)]
[im 1/16]
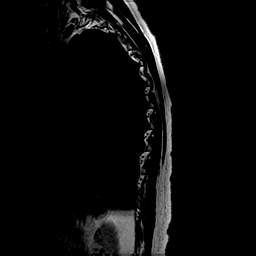
[im 4/16]
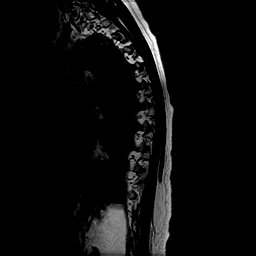
[im 7/16]
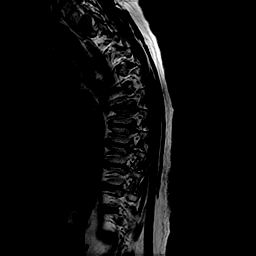
[im 10/16]
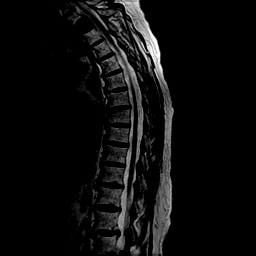
[im 13/16]
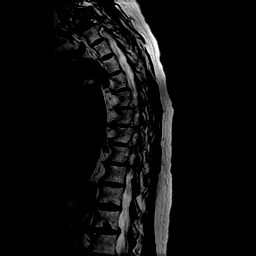
[im 16/16]
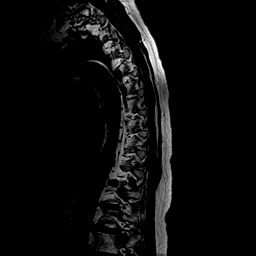

[32 of 48 positions shown; findings below may reference images not displayed]

FINDINGS: Alignment:  Physiologic.

Vertebrae: As seen on recent PET-CT, there are multiple osseous
metastases within the thoracic spine and posterior ribs bilaterally.
There is near complete replacement of the T1 vertebral body by tumor
which extends into the posterior elements. There is associated
pathologic fracture and mild osseous retropulsion, but no cord
deformity. There are metastases within the posterior elements at T3
and T4, best seen on the inversion recovery images. There is a small
lesion in the superior endplate of T7 with a mild pathologic
fracture.

Cord:  The thoracic cord appears normal in signal and caliber.

Paraspinal and other soft tissues: Possible paraspinal soft tissue
tumor on the left at T1, further described below. No other
significant paraspinal findings.

Disc levels:

T7-T1: T1 tumor extension into the left pedicle contributes to left
foraminal narrowing and possible left C8 nerve root encroachment. No
cord deformity.

T1-2: Mild left foraminal narrowing due to tumor in the left T1
pedicle. No cord deformity.

No other evidence of significant epidural or paraspinal tumor. Mild
disc bulging throughout the thoracic spine with wide patency of the
spinal canal.
IMPRESSION: 1. As seen on recent PET-CT, there are multiple osseous metastases
within the thoracic spine and posterior ribs bilaterally.
2. Associated pathologic fracture of T1 with mild osseous
retropulsion and possible left C8 nerve root encroachment within the
foramen at C7-T1. No cord deformity.
3. Mild pathologic fracture of the superior endplate of T7.
4. No significant epidural or paraspinal tumor at the other levels.

## 2020-08-15 MED ORDER — HYDROCODONE-ACETAMINOPHEN 5-325 MG PO TABS
1.0000 | ORAL_TABLET | Freq: Four times a day (QID) | ORAL | 0 refills | Status: DC | PRN
Start: 2020-08-15 — End: 2020-08-28

## 2020-08-21 ENCOUNTER — Other Ambulatory Visit: Payer: Self-pay | Admitting: Radiology

## 2020-08-22 ENCOUNTER — Other Ambulatory Visit: Payer: Self-pay

## 2020-08-22 ENCOUNTER — Encounter (HOSPITAL_COMMUNITY): Payer: Self-pay

## 2020-08-22 ENCOUNTER — Ambulatory Visit (HOSPITAL_COMMUNITY)
Admission: RE | Admit: 2020-08-22 | Discharge: 2020-08-22 | Disposition: A | Discharge status: Home / Self Care | Payer: Medicare Other | Source: Ambulatory Visit | Attending: Hematology | Admitting: Hematology

## 2020-08-22 DIAGNOSIS — J029 Acute pharyngitis, unspecified: Secondary | ICD-10-CM | POA: Diagnosis not present

## 2020-08-22 DIAGNOSIS — R61 Generalized hyperhidrosis: Secondary | ICD-10-CM | POA: Diagnosis not present

## 2020-08-22 DIAGNOSIS — F419 Anxiety disorder, unspecified: Secondary | ICD-10-CM | POA: Insufficient documentation

## 2020-08-22 DIAGNOSIS — F5089 Other specified eating disorder: Secondary | ICD-10-CM | POA: Diagnosis not present

## 2020-08-22 DIAGNOSIS — F329 Major depressive disorder, single episode, unspecified: Secondary | ICD-10-CM | POA: Diagnosis not present

## 2020-08-22 DIAGNOSIS — Z7902 Long term (current) use of antithrombotics/antiplatelets: Secondary | ICD-10-CM | POA: Diagnosis not present

## 2020-08-22 DIAGNOSIS — M7918 Myalgia, other site: Secondary | ICD-10-CM | POA: Diagnosis not present

## 2020-08-22 DIAGNOSIS — Z170 Estrogen receptor positive status [ER+]: Secondary | ICD-10-CM

## 2020-08-22 DIAGNOSIS — I2510 Atherosclerotic heart disease of native coronary artery without angina pectoris: Secondary | ICD-10-CM | POA: Insufficient documentation

## 2020-08-22 DIAGNOSIS — Z79899 Other long term (current) drug therapy: Secondary | ICD-10-CM | POA: Diagnosis not present

## 2020-08-22 DIAGNOSIS — N644 Mastodynia: Secondary | ICD-10-CM | POA: Diagnosis not present

## 2020-08-22 DIAGNOSIS — Z87891 Personal history of nicotine dependence: Secondary | ICD-10-CM | POA: Diagnosis not present

## 2020-08-22 DIAGNOSIS — C7951 Secondary malignant neoplasm of bone: Secondary | ICD-10-CM | POA: Diagnosis not present

## 2020-08-22 DIAGNOSIS — Z6826 Body mass index (BMI) 26.0-26.9, adult: Secondary | ICD-10-CM | POA: Diagnosis not present

## 2020-08-22 DIAGNOSIS — K219 Gastro-esophageal reflux disease without esophagitis: Secondary | ICD-10-CM | POA: Insufficient documentation

## 2020-08-22 DIAGNOSIS — Z853 Personal history of malignant neoplasm of breast: Secondary | ICD-10-CM | POA: Insufficient documentation

## 2020-08-22 DIAGNOSIS — Z7982 Long term (current) use of aspirin: Secondary | ICD-10-CM | POA: Diagnosis not present

## 2020-08-22 DIAGNOSIS — N390 Urinary tract infection, site not specified: Secondary | ICD-10-CM | POA: Diagnosis not present

## 2020-08-22 DIAGNOSIS — Z6827 Body mass index (BMI) 27.0-27.9, adult: Secondary | ICD-10-CM | POA: Diagnosis not present

## 2020-08-22 DIAGNOSIS — E663 Overweight: Secondary | ICD-10-CM | POA: Diagnosis not present

## 2020-08-22 DIAGNOSIS — R071 Chest pain on breathing: Secondary | ICD-10-CM | POA: Diagnosis not present

## 2020-08-22 DIAGNOSIS — M899 Disorder of bone, unspecified: Secondary | ICD-10-CM | POA: Diagnosis not present

## 2020-08-22 DIAGNOSIS — J309 Allergic rhinitis, unspecified: Secondary | ICD-10-CM | POA: Diagnosis not present

## 2020-08-22 LAB — CBC
HCT: 40.8 % (ref 36.0–46.0)
Hemoglobin: 13.1 g/dL (ref 12.0–15.0)
MCH: 30.4 pg (ref 26.0–34.0)
MCHC: 32.1 g/dL (ref 30.0–36.0)
MCV: 94.7 fL (ref 80.0–100.0)
Platelets: 293 10*3/uL (ref 150–400)
RBC: 4.31 MIL/uL (ref 3.87–5.11)
RDW: 13.6 % (ref 11.5–15.5)
WBC: 7.5 10*3/uL (ref 4.0–10.5)
nRBC: 0 % (ref 0.0–0.2)

## 2020-08-22 LAB — PROTIME-INR
INR: 1 (ref 0.8–1.2)
Prothrombin Time: 12.7 seconds (ref 11.4–15.2)

## 2020-08-22 IMAGING — CT CT BIOPSY
2 of 3 series · 13 of 32 positions shown, 19 images · non-contrast
Comparison: none

INDICATION: 87-year-old female referred for biopsy of soft tissue lesion
involving the posterior right ribs

[Series 2: i-spiral 5.0 b40f · axial · 0.59mm/px · z∈[+1471,+1534]mm · 10 of 24 slices shown, 16 images (1 of 2)]
[im 3/24  soft-tissue]
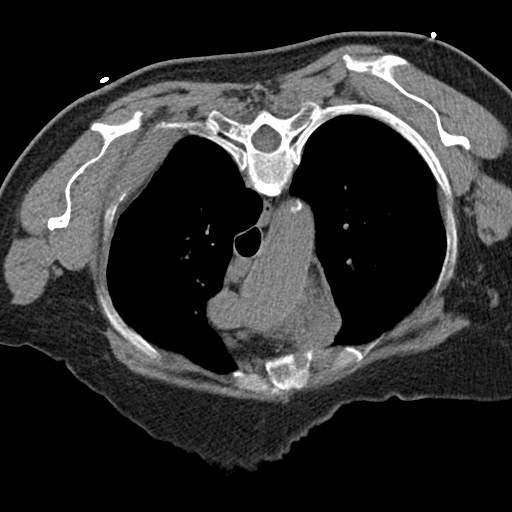
[im 3/24  bone]
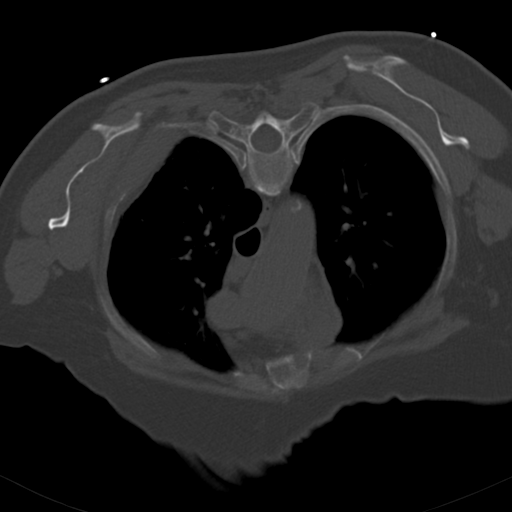
[im 5/24  soft-tissue]
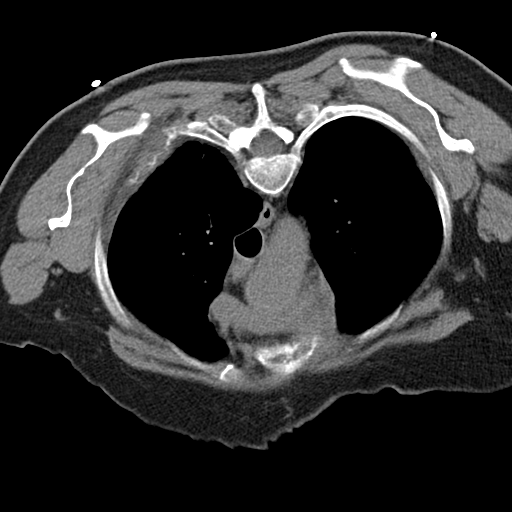
[im 7/24  soft-tissue]
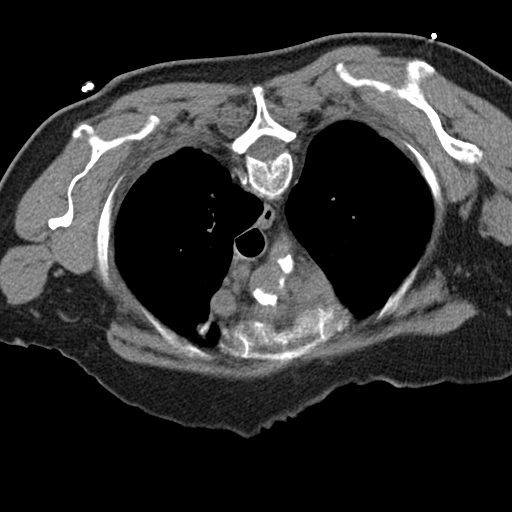
[im 9/24  soft-tissue]
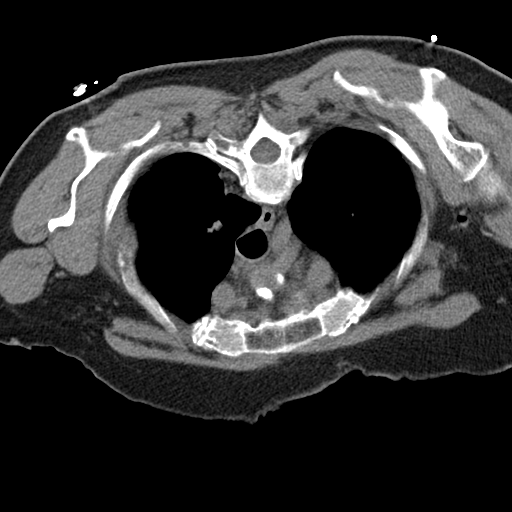
[im 11/24  soft-tissue]
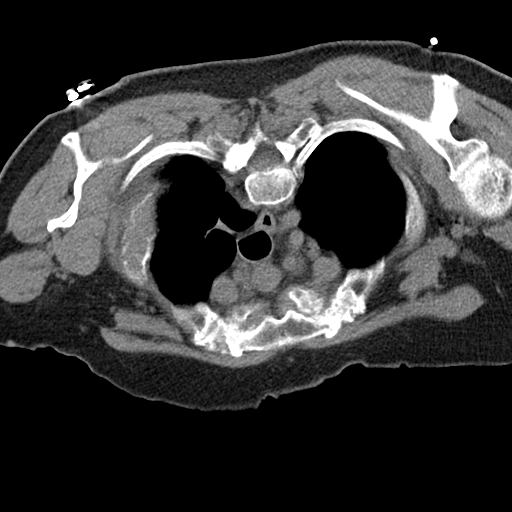
[im 13/24  soft-tissue]
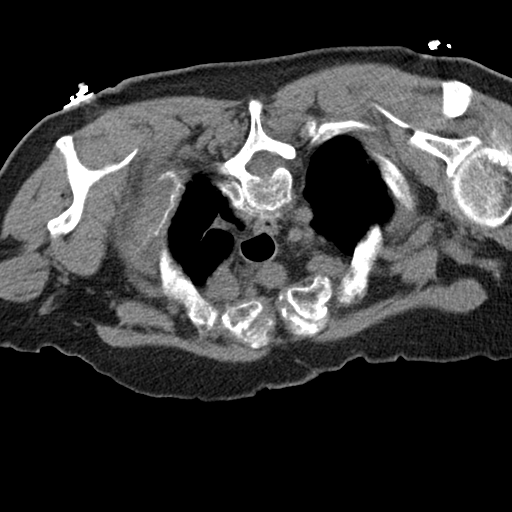
[im 15/24  soft-tissue]
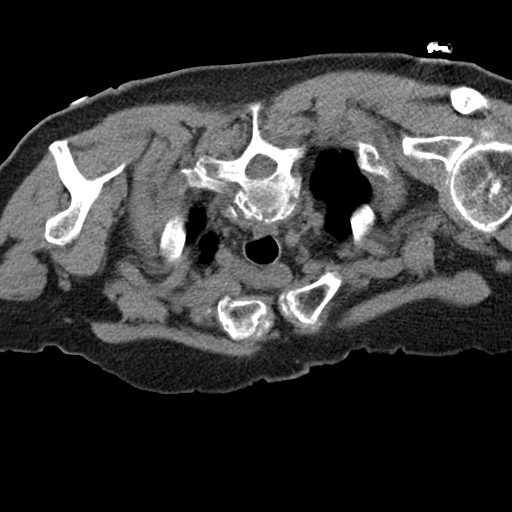
[im 15/24  lung]
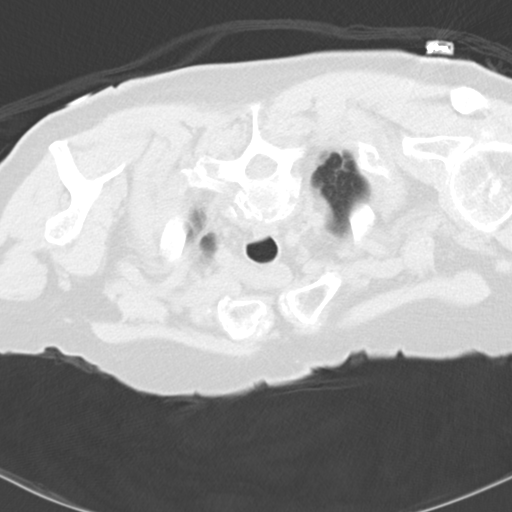
[im 17/24  soft-tissue]
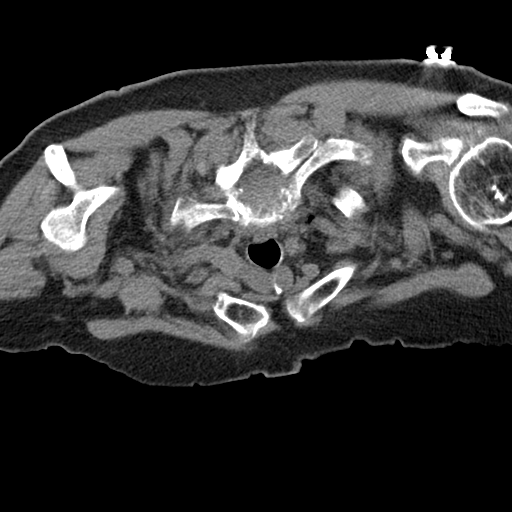
[im 17/24  lung]
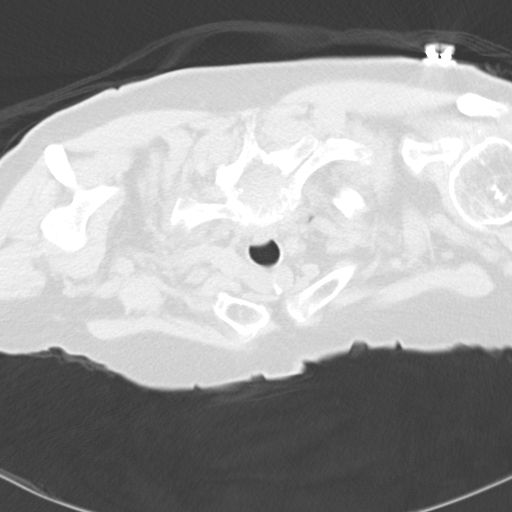
[im 19/24  soft-tissue]
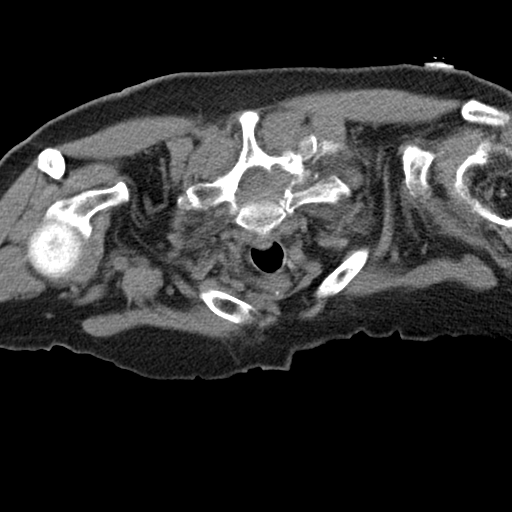
[im 19/24  lung]
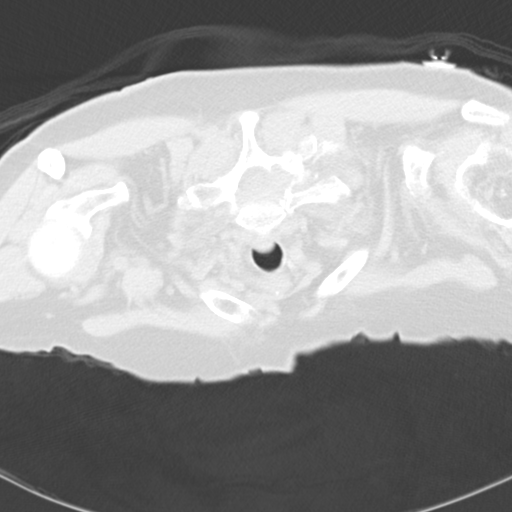
[im 19/24  bone]
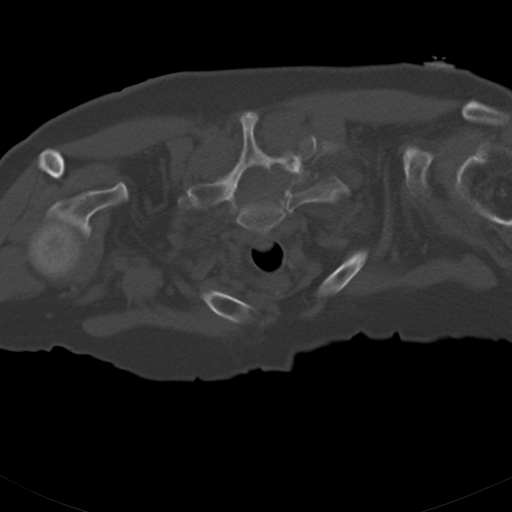
[im 21/24  soft-tissue]
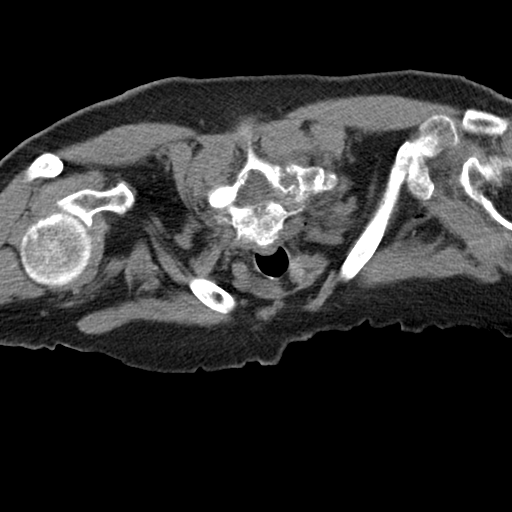
[im 21/24  lung]
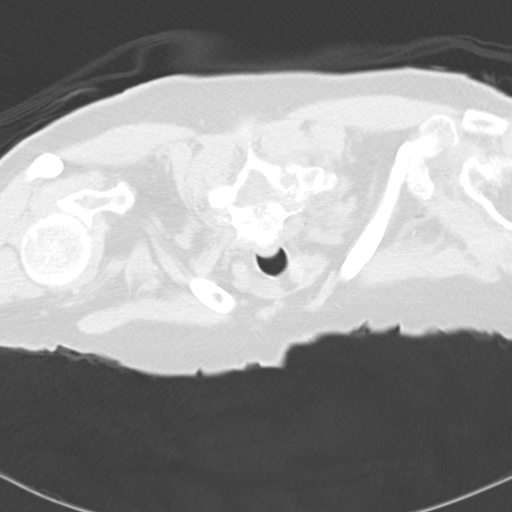

[Series 4: i-spiral 5.0 b40f · axial · 0.59mm/px · z∈[+1438,+1458]mm · 3 of 24 slices shown (2 of 2)]
[im 3/24  soft-tissue]
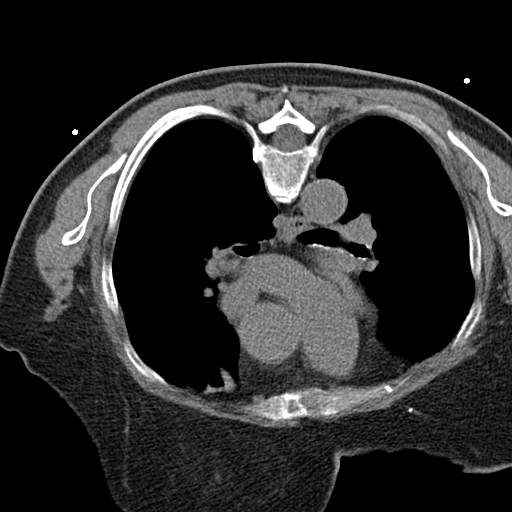
[im 5/24  soft-tissue]
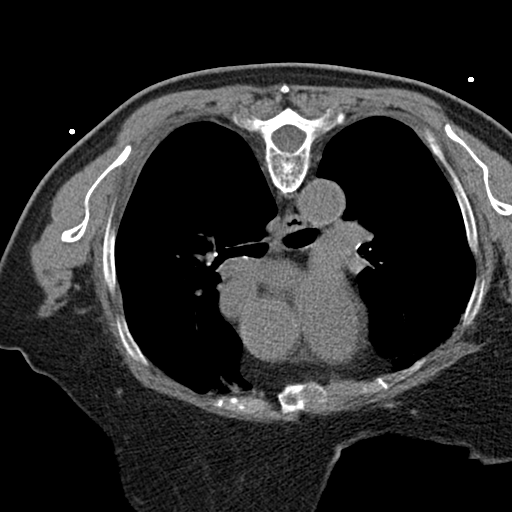
[im 9/24  soft-tissue]
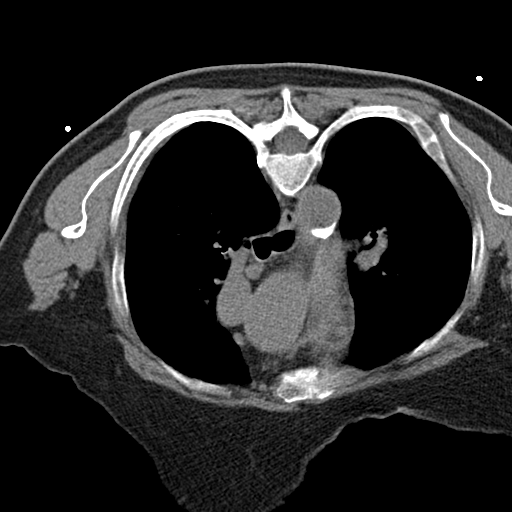

[13 of 32 positions shown; findings below may reference images not displayed]

EXAM:
CT BIOPSY

MEDICATIONS:
None.

ANESTHESIA/SEDATION:
Moderate (conscious) sedation was employed during this procedure. A
total of Versed 1.0 mg and Fentanyl 50 mcg was administered
intravenously.

Moderate Sedation Time: 16 minutes. The patient's level of
consciousness and vital signs were monitored continuously by
radiology nursing throughout the procedure under my direct
supervision.

FLUOROSCOPY TIME:  CT

COMPLICATIONS:
None

PROCEDURE:
Informed written consent was obtained from the patient after a
thorough discussion of the procedural risks, benefits and
alternatives. All questions were addressed. Maximal Sterile Barrier
Technique was utilized including caps, mask, sterile gowns, sterile
gloves, sterile drape, hand hygiene and skin antiseptic. A timeout
was performed prior to the initiation of the procedure.

Patient was position prone position on the CT gantry table. Scout CT
was acquired for planning purposes. Patient is prepped and draped in
the usual sterile fashion. 1% lidocaine was used for local
anesthesia.

Using CT guidance 17 gauge guide needle was advanced in a parallel
manner into the soft tissue lesion of the posterior right fourth
rib. Once we confirmed needle tip position, multiple 18 gauge core
biopsy were acquired. Only scant amount of material was acquired
despite multiple core biopsy.

Needle was removed and a final CT image was performed.

Sample was sent to the lab in formalin.

Patient tolerated the procedure well and remained hemodynamically
stable throughout.

No complications were encountered and no significant blood loss.
IMPRESSION: Status post CT-guided biopsy of posterior right fourth rib soft
tissue lesion.

## 2020-08-22 MED ORDER — FENTANYL CITRATE (PF) 100 MCG/2ML IJ SOLN
INTRAMUSCULAR | Status: AC | PRN
  Administered 2020-08-22: 50 ug via INTRAVENOUS

## 2020-08-22 MED ORDER — MIDAZOLAM HCL 2 MG/2ML IJ SOLN
INTRAMUSCULAR | Status: AC
  Filled 2020-08-22: qty 2

## 2020-08-22 MED ORDER — MIDAZOLAM HCL 2 MG/2ML IJ SOLN
INTRAMUSCULAR | Status: AC | PRN
  Administered 2020-08-22: 1 mg via INTRAVENOUS

## 2020-08-22 MED ORDER — SODIUM CHLORIDE 0.9 % IV SOLN: INTRAVENOUS | Status: DC

## 2020-08-22 MED ORDER — FENTANYL CITRATE (PF) 100 MCG/2ML IJ SOLN
INTRAMUSCULAR | Status: AC
  Filled 2020-08-22: qty 2

## 2020-08-22 NOTE — Procedures (Signed)
Interventional Radiology Procedure Note  Procedure: CT guided biopsy of posterior rib lesion, soft tissue component.   Complications: None EBL: None  Recommendations: - Bedrest 1 hours.   - Routine wound care - Follow up pathology - Advance diet   Signed,  Corrie Mckusick, DO

## 2020-08-22 NOTE — H&P (Signed)
Chief Complaint: Patient was seen in consultation today for right 3rd rib lesion bioopsy at the request of Katragadda,Sreedhar  Referring Physician(s): Katragadda,Sreedhar  Supervising Physician: Corrie Mckusick  Patient Status: [facility] - Out-pt  History of Present Illness: Kara Woods is a 86 y.o. female   Hx Breast Ca 2014 Follows with Dr Delton Coombes Has had recent right chest pain few weeks No injury  PET 08/07/20: IMPRESSION: 1. Widespread hypermetabolic osseous metastasis, including lesions with soft tissue components at T1 and the sacrum. Consider pre and postcontrast thoracic spine MRI. Lumbosacral spine imaging could be performed based on clinical symptoms. 2. Thoracic nodal metastasis. No other evidence of soft tissue Metastasis.  Scheduled now for right 3rd rib lesion biopsy   Past Medical History:  Diagnosis Date  . Anxiety   . Arthritis   . Blind left eye   . Breast cancer ([place])    left   . Colitis   . Coronary artery disease    a. 10/2016 Staged PCI: RCA 50p, 10m(2.25x12 Resolute Onyx DES), LCX 50p, 866m2.75x23 Xience Alpine DES). Residual LAD 50p/m, 4534m  . CMarland Kitchenstocele 10/11/2013  . Depression   . Diastolic dysfunction    a. 09/2016 Echo: EF 50%, diffuse hypokinesis, mild LVH, grade 1 diastolic dysfunction, mild mitral regurgitation, mildly dilated left atrium.  . GMarland KitchenRD (gastroesophageal reflux disease)    occasional Tums only  . HOH (hard of hearing)   . Pelvic relaxation 10/11/2013  . Proctitis    colonsocopy 2007, Canasa suppositories  . S/P endoscopy March 2009   mild erosive reflux esophagitis, Schatzki's ring, s/p dilation  . Schatzki's ring   . Shortness of breath    occ if anxiety  . Wears dentures    upper  . Wears glasses     Past Surgical History:  Procedure Laterality Date  . ABDOMINAL HYSTERECTOMY    . ANTERIOR AND POSTERIOR REPAIR N/A 05/17/2014   Procedure: ANTERIOR (CYSTOCELE) AND POSTERIOR REPAIR (RECTOCELE);  Surgeon:  ScoReece PackerD;  [facility];  Service: Urology;  Laterality: N/A;  . APPENDECTOMY    . BREAST LUMPECTOMY WITH NEEDLE LOCALIZATION AND AXILLARY SENTINEL LYMPH NODE BX Left 05/03/2013   Procedure: BREAST LUMPECTOMY WITH NEEDLE LOCALIZATION AND AXILLARY SENTINEL LYMPH NODE BX;  Surgeon: BenEdward JollyD;  [facility];  Laterality: Left;  . BREAST SURGERY Left 05/19/13  . CARDIAC CATHETERIZATION  1998  . CARDIAC CATHETERIZATION N/A 11/04/2016   Procedure: Left Heart Cath and Coronary Angiography;  Surgeon: ThoTroy SineD;  Location: [facility];  Service: Cardiovascular;  Laterality: N/A;  . CARDIAC CATHETERIZATION N/A 11/05/2016   Procedure: Coronary Stent Intervention;  Surgeon: ThoTroy SineD;  Location: [facility];  Service: Cardiovascular;  Laterality: N/A;  . CHOLECYSTECTOMY    . COLONOSCOPY  05/06/2006   Diffuse inflammatory changes of the rectal mucosa, consistent  with proctitis.  Otherwise, normal colon to terminal ileum  . CORONARY ANGIOPLASTY  11/04/2016  . CORONARY STENT PLACEMENT     Drug-eluting coronary artery stent, non-bioabsorbable-polymer-coated  . CYSTOSCOPY N/A 05/17/2014   Procedure: CYSTOSCOPY;  Surgeon: ScoReece PackerD;  [facility];  Service: Urology;  Laterality: N/A;  . ESOPHAGOGASTRODUODENOSCOPY  02/09/2008   Schatzki ring status post dilation/Distal esophageal erosion consistent with mild erosive reflux  esophagitis, otherwise unremarkable esophagus, normal stomach, D1, D2.  . EYE SURGERY     lt cataract-implant  . EYE SURGERY  lt-lens repaced,l  . [place] SURGERY  1993  . RE-EXCISION OF BREAST LUMPECTOMY Left 05/19/2013   Procedure: RE-EXCISION OF BREAST LUMPECTOMY;  Surgeon: Edward Jolly, MD;  Location: [place];  Service: General;  Laterality: Left;  . RETINAL DETACHMENT SURGERY  1978   Left  . VAGINAL HYSTERECTOMY Bilateral 05/17/2014     Procedure: HYSTERECTOMY VAGINAL with Bilateral Salpingo-Oophorectomy; Bladder cystotomy repair;  Surgeon: Marvene Staff, MD;  Location: [facility];  Service: Gynecology;  Laterality: Bilateral;  . VAGINAL PROLAPSE REPAIR N/A 05/17/2014   Procedure: VAGINAL VAULT PROLAPSE AND GRAFT;  Surgeon: Reece Packer, MD;  Location: [facility];  Service: Urology;  Laterality: N/A;    Allergies: Contrast media [iodinated diagnostic agents], Lipitor [atorvastatin], Sulfamethoxazole, and Sulfonamide derivatives  Medications: Prior to Admission medications   Medication Sig Start Date End Date Taking? Authorizing Provider  acetaminophen (TYLENOL) 500 MG tablet Take 500 mg by mouth every 6 (six) hours as needed for mild pain.    Yes [provider]  aspirin EC 81 MG tablet Take 1 tablet (81 mg total) by mouth daily with breakfast. 07/24/20  Yes Emokpae, Courage, MD  carboxymethylcellulose (REFRESH PLUS) 0.5 % SOLN Place 3-4 drops into both eyes 3 (three) times daily as needed (dry eyes). Preservative free   Yes [provider]  clopidogrel (PLAVIX) 75 MG tablet Take 75 mg by mouth daily.  12/17/18  Yes [provider]  clorazepate (TRANXENE) 15 MG tablet Take 7.5 mg by mouth daily. May take an additional 7.5 mg if needed   Yes [provider]  ezetimibe (ZETIA) 10 MG tablet Take 10 mg by mouth daily.   Yes [provider]  HYDROcodone-acetaminophen (NORCO) 5-325 MG tablet Take 1 tablet by mouth every 6 (six) hours as needed for moderate pain. Patient taking differently: Take 1 tablet by mouth every 4 (four) hours as needed for moderate pain.  08/15/20  Yes Derek Jack, MD  latanoprost (XALATAN) 0.005 % ophthalmic solution Place 1 drop into both eyes at bedtime.   Yes [provider]  metoprolol tartrate (LOPRESSOR) 25 MG tablet TAKE 1/2 TABLET BY MOUTH TWICE A DAY Patient taking differently: Take 12.5 mg by mouth 2 (two) times daily.  01/26/20  Yes  Troy Sine, MD  nitroGLYCERIN (NITROSTAT) 0.4 MG SL tablet Place 1 tablet (0.4 mg total) under the tongue every 5 (five) minutes as needed. Patient taking differently: Place 0.4 mg under the tongue every 5 (five) minutes as needed for chest pain.  11/06/16   Cheryln Manly, NP     Family History  Problem Relation Age of Onset  . Cancer Brother        spinal  . Heart attack Mother   . Heart attack Father   . Heart attack Son   . Heart attack Son   . Depression Son   . Multiple myeloma Daughter   . Anemia Daughter   . Colon cancer Neg Hx     Social History   Socioeconomic History  . Marital status: Widowed    Spouse name: Not on file  . Number of children: 6  . Years of education: Not on file  . Highest education level: Not on file  Occupational History  . Occupation: Retired  Tobacco Use  . Smoking status: Former Smoker    Packs/day: 1.00    Types: Cigarettes    Quit date: 04/28/1990    Years since quitting: 30.3  . Smokeless tobacco: Never  Used  . Tobacco comment: quit 28 yrs ago  Substance and Sexual Activity  . Alcohol use: Yes    Alcohol/week: 1.0 standard drink    Types: 1 Glasses of wine per week    Comment: occ  . Drug use: No  . Sexual activity: Not Currently    Birth control/protection: Post-menopausal  Other Topics Concern  . Not on file  Social History Narrative  . Not on file   Social Determinants of Health   Financial Resource Strain:   . Difficulty of Paying Living Expenses: Not on file  Food Insecurity:   . Worried About Charity fundraiser in the Last Year: Not on file  . Ran Out of Food in the Last Year: Not on file  Transportation Needs:   . Lack of Transportation (Medical): Not on file  . Lack of Transportation (Non-Medical): Not on file  Physical Activity:   . Days of Exercise per Week: Not on file  . Minutes of Exercise per Session: Not on file  Stress:   . Feeling of Stress : Not on file  Social Connections:   . Frequency  of Communication with Friends and Family: Not on file  . Frequency of Social Gatherings with Friends and Family: Not on file  . Attends Religious Services: Not on file  . Active Member of Clubs or Organizations: Not on file  . Attends Archivist Meetings: Not on file  . Marital Status: Not on file     Review of Systems: A 12 point ROS discussed and pertinent positives are indicated in the HPI above.  All other systems are negative.  Review of Systems  Constitutional: Negative for activity change, fatigue and fever.  Respiratory: Negative for cough and shortness of breath.   Cardiovascular: Positive for chest pain.       Rt chest pain-- beneath breast area  Gastrointestinal: Negative for abdominal pain.  Psychiatric/Behavioral: Negative for behavioral problems and confusion.    Vital Signs: BP 140/85   Pulse 81   Temp 98.2 F (36.8 C) (Oral)   Resp 15   Ht 5' 6"  (1.676 m)   Wt 160 lb (72.6 kg)   SpO2 96%   BMI 25.82 kg/m   Physical Exam Vitals reviewed.  HENT:     Mouth/Throat:     Mouth: Mucous membranes are moist.  Cardiovascular:     Rate and Rhythm: Normal rate and regular rhythm.     Heart sounds: Normal heart sounds.  Pulmonary:     Effort: Pulmonary effort is normal.     Breath sounds: Normal breath sounds.  Abdominal:     Palpations: Abdomen is soft.  Musculoskeletal:        General: Normal range of motion.  Skin:    General: Skin is warm.  Neurological:     Mental Status: She is alert and oriented to person, place, and time.  Psychiatric:        Behavior: Behavior normal.     Imaging: MR BRAIN WO CONTRAST  Result Date: 07/24/2020 CLINICAL DATA:  Neuro deficit, acute, stroke suspected. EXAM: MRI HEAD WITHOUT CONTRAST TECHNIQUE: Multiplanar, multiecho pulse sequences of the brain and surrounding structures were obtained without intravenous contrast. COMPARISON:  Noncontrast head CT performed earlier the same day 07/24/2020. Prior head CT  examination 12/10/2018 and earlier. FINDINGS: Brain: Mild intermittent motion degradation. Mild generalized parenchymal atrophy. Moderate scattered T2/FLAIR hyperintensity within the cerebral white matter is nonspecific, but consistent with chronic small vessel ischemic disease.  Small chronic infarcts within the cerebellar hemispheres bilaterally. There is no acute infarct. No evidence of intracranial mass. No chronic intracranial blood products. No extra-axial fluid collection. No midline shift. Vascular: Expected proximal arterial flow voids. Skull and upper cervical spine: No focal marrow lesion. Sinuses/Orbits: Left scleral buckle. No acute orbital abnormality. No significant paranasal sinus disease or mastoid effusion. IMPRESSION: Mildly motion degraded examination. No evidence of acute intracranial abnormality, including acute infarction. Moderate chronic small vessel ischemic changes within cerebral white matter. 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 Mild generalized parenchymal atrophy. Electronically Signed   By: Kellie Simmering DO   On: 07/24/2020 15:12   MR THORACIC SPINE WO CONTRAST  Result Date: 08/15/2020 CLINICAL DATA:  Breast cancer staging. Multiple osseous metastases on recent PET-CT, with involvement of the thoracic spine. EXAM: MRI THORACIC SPINE WITHOUT CONTRAST TECHNIQUE: Multiplanar, multisequence MR imaging of the thoracic spine was performed. No intravenous contrast was administered. Patient refused contrast. COMPARISON:  PET-CT 08/07/2020. FINDINGS: Alignment:  Physiologic. Vertebrae: As seen on recent PET-CT, there are multiple osseous metastases within the thoracic spine and posterior ribs bilaterally. There is near complete replacement of the T1 vertebral body by tumor which extends into the posterior elements. There is associated pathologic fracture and mild osseous retropulsion, but no cord deformity. There are metastases within the posterior elements at T3  and T4, best seen on the inversion recovery images. There is a small lesion in the superior endplate of T7 with a mild pathologic fracture. Cord:  The thoracic cord appears normal in signal and caliber. Paraspinal and other soft tissues: Possible paraspinal soft tissue tumor on the left at T1, further described below. No other significant paraspinal findings. Disc levels: T7-T1: T1 tumor extension into the left pedicle contributes to left foraminal narrowing and possible left C8 nerve root encroachment. No cord deformity. T1-2: Mild left foraminal narrowing due to tumor in the left T1 pedicle. No cord deformity. No other evidence of significant epidural or paraspinal tumor. Mild disc bulging throughout the thoracic spine with wide patency of the spinal canal. IMPRESSION: 1. As seen on recent PET-CT, there are multiple osseous metastases within the thoracic spine and posterior ribs bilaterally. 2. Associated pathologic fracture of T1 with mild osseous retropulsion and possible left C8 nerve root encroachment within the foramen at C7-T1. No cord deformity. 3. Mild pathologic fracture of the superior endplate of T7. 4. No significant epidural or paraspinal tumor at the other levels. Electronically Signed   By: Richardean Sale M.D.   On: 08/15/2020 15:38   MR LUMBAR SPINE WO CONTRAST  Result Date: 08/15/2020 CLINICAL DATA:  Metastatic breast cancer with known osseous metastases on PET-CT. EXAM: MRI LUMBAR SPINE WITHOUT CONTRAST TECHNIQUE: Multiplanar, multisequence MR imaging of the lumbar spine was performed. No intravenous contrast was administered. Patient refused contrast. COMPARISON:  PET-CT 08/07/2020.  Abdominopelvic CT 11/19/2016. FINDINGS: Segmentation: Conventional anatomy assumed, with the last open disc space designated L5-S1.Concordant with previous imaging. Alignment: Convex left scoliosis centered at L2-3. Degenerative grade 1 anterolisthesis at L5-S1. Vertebrae: As seen on recent PET-CT, there are  multiple osseous metastases within the lumbar spine and upper sacrum. Lesions are noted within the left L1 pedicle, the left aspect of the L3 vertebral body, the central aspect of the L4 vertebral body, the right aspect of the L5 vertebral body and the upper sacrum. No evidence of pathologic fracture or epidural tumor. Lesions in the iliac bones are partially imaged. The sacroiliac joints appear unremarkable. Conus medullaris: Extends  to the L1-2 level and appears normal. Paraspinal and other soft tissues: No significant paraspinal findings. Disc levels: T12-L1: Loss of disc height with annular disc bulging and endplate osteophytes. No significant spinal stenosis or nerve root encroachment. L1-2: Loss of disc height with annular disc bulging and endplate osteophytes asymmetric to the right. Asymmetric right-sided facet hypertrophy. Resulting mild to moderate right lateral recess and right foraminal narrowing. L2-3: Loss of disc height with annular disc bulging and endplate osteophytes asymmetric to the right. Mild bilateral facet hypertrophy. Mild right lateral recess and right foraminal narrowing. L3-4: Chronic degenerative disc disease with loss of disc height, annular disc bulging and endplate osteophytes. Mild bilateral facet hypertrophy. Mild narrowing of the lateral recesses and foramina bilaterally. L4-5: Previous posterior decompression with wide patency of the spinal canal and lateral recesses. Chronic degenerative disc disease with loss of disc height and endplate osteophytes. Mild foraminal narrowing bilaterally. L5-S1: Relatively preserved disc height with mild disc bulging and moderate bilateral facet hypertrophy. Mild foraminal narrowing bilaterally. IMPRESSION: 1. Multilevel osseous metastases within the lumbar spine and upper pelvis as seen on recent PET-CT. No evidence of pathologic fracture or epidural tumor. 2. Multilevel spondylosis with disc bulging, endplate osteophytes and facet hypertrophy as  described. No high-grade spinal stenosis. Mild to moderate lateral recess and foraminal narrowing as detailed above. 3. Previous posterior decompression at L4-5. Electronically Signed   By: Richardean Sale M.D.   On: 08/15/2020 15:56   US Carotid Bilateral  Result Date: 07/25/2020 CLINICAL DATA:  Syncope and collapse. Right lower extremity weakness. History of CAD, hypertension and hyperlipidemia. Former smoker. EXAM: BILATERAL CAROTID DUPLEX ULTRASOUND TECHNIQUE: Pearline Cables scale imaging, color Doppler and duplex ultrasound were performed of bilateral carotid and vertebral arteries in the neck. COMPARISON:  None. FINDINGS: Criteria: Quantification of carotid stenosis is based on velocity parameters that correlate the residual internal carotid diameter with NASCET-based stenosis levels, using the diameter of the distal internal carotid lumen as the denominator for stenosis measurement. The following velocity measurements were obtained: RIGHT ICA: 77/21 cm/sec CCA: 16/10 cm/sec SYSTOLIC ICA/CCA RATIO:  0.9 ECA: 304 cm/sec LEFT ICA: 64/20 cm/sec CCA: 96/0 cm/sec SYSTOLIC ICA/CCA RATIO:  0.8 ECA: 57 cm/sec RIGHT CAROTID ARTERY: There is a minimal amount of eccentric echogenic plaque scattered throughout the right common carotid artery (representative image 8). There is a minimal to moderate amount of eccentric echogenic plaque within the right carotid bulb (images 16 and 18). There is a large amount of eccentric echogenic plaque involving the origin and proximal aspects the right internal carotid artery (image 29), morphologically resulting in 50% luminal narrowing though not resulting in elevated peak systolic velocities within the interrogated course of the right internal carotid artery to suggest a hemodynamically significant stenosis RIGHT VERTEBRAL ARTERY:  Antegrade flow LEFT CAROTID ARTERY: There is a minimal amount of circumferential intimal thickening throughout the left common carotid artery (images 43, 48 and  52). There is a minimal amount of eccentric echogenic plaque within the left carotid bulb (images 57 and 58), extending to involve the origin and proximal aspects of the left internal carotid artery (image 67), not resulting in elevated peak systolic velocities within the interrogated course of the left internal carotid artery to suggest a hemodynamically significant stenosis. LEFT VERTEBRAL ARTERY:  Antegrade flow IMPRESSION: 1. Large amount of right-sided atherosclerotic plaque morphologically results in at least 50% luminal narrowing though does not result in elevated peak systolic velocities with the right internal carotid artery to suggest a hemodynamically significant stenosis.  If clinical concern persists, further evaluation with CTA could as clinically indicated. 2. Minimal amount of left-sided atherosclerotic plaque, not resulting in a hemodynamically significant stenosis. Electronically Signed   By: Sandi Mariscal M.D.   On: 07/25/2020 09:45   NM PET Image Initial (PI) Skull Base To Thigh  Result Date: 08/08/2020 CLINICAL DATA:  Subsequent treatment strategy for left sided lumpectomy 7 years ago for breast cancer. Restaging. Elevated Cea. COVID vaccine in left arm 6/21. EXAM: NUCLEAR MEDICINE PET SKULL BASE TO THIGH TECHNIQUE: 9.2 mCi F-18 FDG was injected intravenously. Full-ring PET imaging was performed from the skull base to thigh after the radiotracer. CT data was obtained and used for attenuation correction and anatomic localization. Fasting blood glucose: 92 mg/dl COMPARISON:  11/19/2016 abdominopelvic CT. CTA chest 09/01/2016. FINDINGS: Mediastinal blood pool activity: SUV max 2.1 Liver activity: SUV max NA NECK: No areas of abnormal hypermetabolism. Incidental CT findings: No cervical adenopathy. Advanced bilateral carotid atherosclerosis. Surgical changes about the left globe. CHEST: Hypermetabolic thoracic adenopathy. Right paratracheal 1.5 cm node measures a S.U.V. max of 5.9 on 92/3.  Adenopathy within the prevascular and left internal mammary spaces including at 2.4 x 2.1 cm and a S.U.V. max of 6.9 on 98/3. Incidental CT findings: Aortic and coronary artery atherosclerosis. Mild cardiomegaly. Centrilobular emphysema. ABDOMEN/PELVIS: No abdominopelvic parenchymal or nodal hypermetabolism. Incidental CT findings: Normal adrenal glands. Cholecystectomy. Abdominal aortic atherosclerosis. Hysterectomy. Pelvic floor laxity. SKELETON: Widespread hypermetabolic osseous metastasis. Right posterior third rib expansile hypermetabolic lesion on the order of 3.8 cm and a S.U.V. max of 8.5 on 78/3. A T1 lytic lesion with probable extension into the canal measures on the order of 2.8 cm and a S.U.V. max of 10.8 on 64/3. Right femoral neck lytic lesion measures 1.8 cm and a S.U.V. max of 7.5 on 295/3. A sacral osseous and soft tissue density lesion in the midline measures 2.6 x 2.0 cm and a S.U.V. max of 7.2 on 230/3. Incidental CT findings: none IMPRESSION: 1. Widespread hypermetabolic osseous metastasis, including lesions with soft tissue components at T1 and the sacrum. Consider pre and postcontrast thoracic spine MRI. Lumbosacral spine imaging could be performed based on clinical symptoms. 2. Thoracic nodal metastasis. No other evidence of soft tissue metastasis. 3. Incidental findings, including: Aortic atherosclerosis (ICD10-I70.0), coronary artery atherosclerosis and emphysema (ICD10-J43.9). Pelvic floor laxity. Electronically Signed   By: Abigail Miyamoto M.D.   On: 08/08/2020 11:28   ECHOCARDIOGRAM COMPLETE  Result Date: 07/24/2020    ECHOCARDIOGRAM REPORT   Patient Name:   SYRITA DOVEL Date of Exam: 07/24/2020 Medical Rec #:  [id number]        Height:       66.0 in Accession #:    [id number]       Weight:       166.0 lb Date of Birth:  September 23, 1933        BSA:          1.848 m Patient Age:    61 years         BP:           130/100 mmHg Patient Gender: F                HR:           74 bpm. Exam  Location:  [facility] Procedure: 2D Echo, Cardiac Doppler and Color Doppler Indications:    TIA 435.9 / G45.9  History:        Patient has prior history  of Echocardiogram examinations, most                 recent 04/03/2018. CAD; Risk Factors:Hypertension and                 Dyslipidemia. Cancer left breast.  Sonographer:    Alvino Chapel RCS Referring Phys: [id number] COURAGE EMOKPAE IMPRESSIONS  1. Left ventricular ejection fraction, by estimation, is 60 to 65%. The left ventricle has normal function. The left ventricle has no regional wall motion abnormalities. Left ventricular diastolic parameters are consistent with Grade I diastolic dysfunction (impaired relaxation).  2. Right ventricular systolic function is normal. The right ventricular size is normal. There is normal pulmonary artery systolic pressure.  3. Left atrial size was mildly dilated.  4. The mitral valve is normal in structure. Trivial mitral valve regurgitation. No evidence of mitral stenosis.  5. The aortic valve is tricuspid. Aortic valve regurgitation is not visualized. No aortic stenosis is present.  6. The inferior vena cava is normal in size with greater than 50% respiratory variability, suggesting right atrial pressure of 3 mmHg. FINDINGS  Left Ventricle: Left ventricular ejection fraction, by estimation, is 60 to 65%. The left ventricle has normal function. The left ventricle has no regional wall motion abnormalities. The left ventricular internal cavity size was normal in size. There is  no left ventricular hypertrophy. Left ventricular diastolic parameters are consistent with Grade I diastolic dysfunction (impaired relaxation). Normal left ventricular filling pressure. Right Ventricle: The right ventricular size is normal. No increase in right ventricular wall thickness. Right ventricular systolic function is normal. There is normal pulmonary artery systolic pressure. The tricuspid regurgitant velocity is 2.26 m/s, and  with an assumed right  atrial pressure of 3 mmHg, the estimated right ventricular systolic pressure is 06.3 mmHg. Left Atrium: Left atrial size was mildly dilated. Right Atrium: Right atrial size was normal in size. Pericardium: There is no evidence of pericardial effusion. Mitral Valve: The mitral valve is normal in structure. Trivial mitral valve regurgitation. No evidence of mitral valve stenosis. Tricuspid Valve: The tricuspid valve is normal in structure. Tricuspid valve regurgitation is mild . No evidence of tricuspid stenosis. Aortic Valve: The aortic valve is tricuspid. Aortic valve regurgitation is not visualized. No aortic stenosis is present. Aortic valve mean gradient measures 3.1 mmHg. Aortic valve peak gradient measures 5.6 mmHg. Aortic valve area, by VTI measures 1.93 cm. Pulmonic Valve: The pulmonic valve was not well visualized. Pulmonic valve regurgitation is not visualized. No evidence of pulmonic stenosis. Aorta: The aortic root is normal in size and structure. Venous: The inferior vena cava is normal in size with greater than 50% respiratory variability, suggesting right atrial pressure of 3 mmHg. IAS/Shunts: No atrial level shunt detected by color flow Doppler.  LEFT VENTRICLE PLAX 2D LVIDd:         4.49 cm  Diastology LVIDs:         3.10 cm  LV e' lateral:   5.55 cm/s LV PW:         0.93 cm  LV E/e' lateral: 9.7 LV IVS:        0.95 cm  LV e' medial:    3.70 cm/s LVOT diam:     2.00 cm  LV E/e' medial:  14.5 LV SV:         51 LV SV Index:   28 LVOT Area:     3.14 cm  RIGHT VENTRICLE RV S prime:     11.60 cm/s TAPSE (  M-mode): 2.0 cm LEFT ATRIUM             Index       RIGHT ATRIUM           Index LA diam:        3.40 cm 1.84 cm/m  RA Area:     12.60 cm LA Vol (A2C):   53.4 ml 28.90 ml/m RA Volume:   25.70 ml  13.91 ml/m LA Vol (A4C):   53.9 ml 29.17 ml/m LA Biplane Vol: 53.6 ml 29.01 ml/m  AORTIC VALVE AV Area (Vmax):    1.62 cm AV Area (Vmean):   1.71 cm AV Area (VTI):     1.93 cm AV Vmax:           118.71  cm/s AV Vmean:          84.748 cm/s AV VTI:            0.266 m AV Peak Grad:      5.6 mmHg AV Mean Grad:      3.1 mmHg LVOT Vmax:         61.40 cm/s LVOT Vmean:        46.000 cm/s LVOT VTI:          0.163 m LVOT/AV VTI ratio: 0.61  AORTA Ao Root diam: 3.30 cm MITRAL VALVE               TRICUSPID VALVE MV Area (PHT): 2.44 cm    TR Peak grad:   20.4 mmHg MV Decel Time: 311 msec    TR Vmax:        226.00 cm/s MV E velocity: 53.80 cm/s MV A velocity: 82.30 cm/s  SHUNTS MV E/A ratio:  0.65        Systemic VTI:  0.16 m                            Systemic Diam: 2.00 cm Carlyle Dolly MD Electronically signed by Carlyle Dolly MD Signature Date/Time: 07/24/2020/4:11:09 PM    Final    CT HEAD CODE STROKE WO CONTRAST  Result Date: 07/24/2020 CLINICAL DATA:  Code stroke.  Abnormal speech, right-sided weakness EXAM: CT HEAD WITHOUT CONTRAST TECHNIQUE: Contiguous axial images were obtained from the base of the skull through the vertex without intravenous contrast. COMPARISON:  12/10/2018 FINDINGS: Brain: There is no acute intracranial hemorrhage, mass effect, or edema. No new loss of gray-white differentiation. Patchy areas of hypoattenuation in the supratentorial white matter probably reflects stable chronic microvascular ischemic changes. Prominence of the ventricles and sulci reflects minor generalized parenchymal volume loss. There is no extra-axial fluid collection. Vascular: No hyperdense vessel. Intracranial atherosclerotic calcification is present at the skull base. Skull: Unremarkable. Sinuses/Orbits: No acute abnormality. Other: Mastoid air cells are clear. ASPECTS ([place] Stroke Program Early CT Score) - Ganglionic level infarction (caudate, lentiform nuclei, internal capsule, insula, M1-M3 cortex): 7 - Supraganglionic infarction (M4-M6 cortex): 3 Total score (0-10 with 10 being normal): 10 IMPRESSION: No acute intracranial hemorrhage or evidence of acute infarction. ASPECT score is 10. Chronic microvascular  ischemic changes. These results were called by telephone at the time of interpretation on 07/24/2020 at 12:18 pm to provider JOSHUA LONG , who verbally acknowledged these results. Electronically Signed   By: Macy Mis M.D.   On: 07/24/2020 12:21    Labs:  CBC: Recent Labs    09/01/19 1418 07/24/20 1209 07/24/20 1216 08/22/20 0848  WBC 8.4  10.5  --  7.5  HGB 12.9 13.0 13.9 13.1  HCT 40.5 41.5 41.0 40.8  PLT 263 226  --  293    COAGS: Recent Labs    07/24/20 1209 08/22/20 0848  INR 1.0 1.0  APTT 29  --     BMP: Recent Labs    09/01/19 1418 07/24/20 1209 07/24/20 1216  NA 140 137 141  K 4.1 4.0 4.2  CL 106 103 102  CO2 25 24  --   GLUCOSE 101* 94 90  BUN 11 20 19   CALCIUM 8.9 9.0  --   CREATININE 0.73 0.74 0.70  GFRNONAA >60 >60  --   GFRAA >60 >60  --     LIVER FUNCTION TESTS: Recent Labs    07/24/20 1209  BILITOT 0.6  AST 18  ALT 16  ALKPHOS 68  PROT 7.3  ALBUMIN 4.1    TUMOR MARKERS: No results for input(s): AFPTM, CEA, CA199, CHROMGRNA in the last 8760 hours.  Assessment and Plan:  Hx Breast Ca- 2014 New right sided chest pain Imaging revealing bony metastasis Scheduled now for Rt 3rd rib lesion biopsy  Risks and benefits of right 3rd rib lesion bx was discussed with the patient and/or patient's family including, but not limited to bleeding, infection, damage to adjacent structures; pneumothorax requiring chest tube placement or low yield requiring additional tests.  All of the questions were answered and there is agreement to proceed. Consent signed and in chart.  Thank you for this interesting consult.  I greatly enjoyed meeting HANADI STANLY and look forward to participating in their care.  A copy of this report was sent to the requesting provider on this date.  Electronically Signed: Lavonia Drafts, PA-C 08/22/2020, 10:15 AM   I spent a total of  30 Minutes   in face to face in clinical consultation, greater than 50% of which  was counseling/coordinating care for right 3rd rib lesion bx

## 2020-08-22 NOTE — Discharge Instructions (Signed)

## 2020-08-25 ENCOUNTER — Telehealth (HOSPITAL_COMMUNITY): Payer: Self-pay

## 2020-08-25 NOTE — Telephone Encounter (Deleted)
Patient's daughter Kara Woods informed per Francene Finders, NP that patient can take two of the hydrocodone pills at once and see if that helps. She can continue to do this until she sees Dr. Raliegh Ip as long as she is not acting more drowsy or different than normal.   Patient's daughter is agreeable and shows understanding that if patient acts drowsy or different than normal they will back down on dose. Patient's daugher will discuss this further with Dr. Raliegh Ip at her appointment.

## 2020-08-25 NOTE — Telephone Encounter (Addendum)
Patient's daughter Kara Woods called stating that patient has upcoming appointment with Dr. Raliegh Ip. Patient is on hydrocodone 5-325 mg every 6 hrs but patient is still hurting even with medication. Patient's daughter wanted to see if patient could take this maybe every 4 hours instead or if something else could be sent in.   Patient's daughter Kara Woods informed per Francene Finders, NP that patient can take two of the hydrocodone pills at once and see if that helps. She can continue to do this until she sees Dr. Raliegh Ip as long as she is not acting more drowsy or different than normal.   Patient's daughter is agreeable and shows understanding that if patient acts drowsy or different than normal they will back down on dose. Patient's daugher will discuss this further with Dr. Raliegh Ip at her appointment.

## 2020-08-27 LAB — SURGICAL PATHOLOGY

## 2020-08-28 ENCOUNTER — Telehealth (HOSPITAL_COMMUNITY): Payer: Self-pay | Admitting: Pharmacy Technician

## 2020-08-28 ENCOUNTER — Inpatient Hospital Stay (HOSPITAL_COMMUNITY): Payer: Medicare Other

## 2020-08-28 ENCOUNTER — Other Ambulatory Visit: Payer: Self-pay

## 2020-08-28 ENCOUNTER — Telehealth (HOSPITAL_COMMUNITY): Payer: Self-pay | Admitting: Pharmacist

## 2020-08-28 ENCOUNTER — Other Ambulatory Visit (HOSPITAL_COMMUNITY): Payer: Self-pay | Admitting: *Deleted

## 2020-08-28 ENCOUNTER — Other Ambulatory Visit (HOSPITAL_COMMUNITY): Payer: Self-pay | Admitting: Hematology

## 2020-08-28 ENCOUNTER — Inpatient Hospital Stay (HOSPITAL_COMMUNITY): Payer: Medicare Other | Attending: Hematology | Admitting: Hematology

## 2020-08-28 VITALS — BP 134/74 | HR 96 | Temp 97.2°F | Resp 18 | Wt 160.6 lb

## 2020-08-28 DIAGNOSIS — I2510 Atherosclerotic heart disease of native coronary artery without angina pectoris: Secondary | ICD-10-CM | POA: Diagnosis not present

## 2020-08-28 DIAGNOSIS — Z808 Family history of malignant neoplasm of other organs or systems: Secondary | ICD-10-CM | POA: Diagnosis not present

## 2020-08-28 DIAGNOSIS — Z23 Encounter for immunization: Secondary | ICD-10-CM | POA: Insufficient documentation

## 2020-08-28 DIAGNOSIS — R0781 Pleurodynia: Secondary | ICD-10-CM | POA: Insufficient documentation

## 2020-08-28 DIAGNOSIS — C7951 Secondary malignant neoplasm of bone: Secondary | ICD-10-CM | POA: Insufficient documentation

## 2020-08-28 DIAGNOSIS — Z853 Personal history of malignant neoplasm of breast: Secondary | ICD-10-CM | POA: Diagnosis not present

## 2020-08-28 DIAGNOSIS — Z923 Personal history of irradiation: Secondary | ICD-10-CM | POA: Insufficient documentation

## 2020-08-28 DIAGNOSIS — Z79899 Other long term (current) drug therapy: Secondary | ICD-10-CM | POA: Insufficient documentation

## 2020-08-28 DIAGNOSIS — C50919 Malignant neoplasm of unspecified site of unspecified female breast: Secondary | ICD-10-CM

## 2020-08-28 DIAGNOSIS — Z87891 Personal history of nicotine dependence: Secondary | ICD-10-CM | POA: Insufficient documentation

## 2020-08-28 LAB — CBC WITH DIFFERENTIAL/PLATELET
Abs Immature Granulocytes: 0.03 10*3/uL (ref 0.00–0.07)
Basophils Absolute: 0 10*3/uL (ref 0.0–0.1)
Basophils Relative: 0 %
Eosinophils Absolute: 0.1 10*3/uL (ref 0.0–0.5)
Eosinophils Relative: 1 %
HCT: 41.5 % (ref 36.0–46.0)
Hemoglobin: 13.1 g/dL (ref 12.0–15.0)
Immature Granulocytes: 0 %
Lymphocytes Relative: 26 %
Lymphs Abs: 2.2 10*3/uL (ref 0.7–4.0)
MCH: 29.2 pg (ref 26.0–34.0)
MCHC: 31.6 g/dL (ref 30.0–36.0)
MCV: 92.4 fL (ref 80.0–100.0)
Monocytes Absolute: 0.5 10*3/uL (ref 0.1–1.0)
Monocytes Relative: 6 %
Neutro Abs: 5.7 10*3/uL (ref 1.7–7.7)
Neutrophils Relative %: 67 %
Platelets: 270 10*3/uL (ref 150–400)
RBC: 4.49 MIL/uL (ref 3.87–5.11)
RDW: 13.3 % (ref 11.5–15.5)
WBC: 8.6 10*3/uL (ref 4.0–10.5)
nRBC: 0 % (ref 0.0–0.2)

## 2020-08-28 LAB — COMPREHENSIVE METABOLIC PANEL
ALT: 26 U/L (ref 0–44)
AST: 24 U/L (ref 15–41)
Albumin: 4 g/dL (ref 3.5–5.0)
Alkaline Phosphatase: 88 U/L (ref 38–126)
Anion gap: 10 (ref 5–15)
BUN: 16 mg/dL (ref 8–23)
CO2: 26 mmol/L (ref 22–32)
Calcium: 9.3 mg/dL (ref 8.9–10.3)
Chloride: 100 mmol/L (ref 98–111)
Creatinine, Ser: 0.68 mg/dL (ref 0.44–1.00)
GFR calc Af Amer: >60 mL/min (ref >60)
GFR calc non Af Amer: >60 mL/min (ref >60)
Glucose, Bld: 105 mg/dL — ABNORMAL HIGH (ref 70–99)
Potassium: 4 mmol/L (ref 3.5–5.1)
Sodium: 136 mmol/L (ref 135–145)
Total Bilirubin: 0.6 mg/dL (ref 0.3–1.2)
Total Protein: 7.2 g/dL (ref 6.5–8.1)

## 2020-08-28 MED ORDER — GABAPENTIN 100 MG PO CAPS: 100.0000 mg | ORAL_CAPSULE | Freq: Three times a day (TID) | ORAL | 2 refills | Status: DC

## 2020-08-28 MED ORDER — ANASTROZOLE 1 MG PO TABS: 1.0000 mg | ORAL_TABLET | Freq: Every day | ORAL | 6 refills | Status: DC

## 2020-08-28 MED ORDER — PALBOCICLIB 100 MG PO TABS: 100.0000 mg | ORAL_TABLET | Freq: Every day | ORAL | 1 refills | Status: DC

## 2020-08-28 MED ORDER — HYDROCODONE-ACETAMINOPHEN 5-325 MG PO TABS
1.0000 | ORAL_TABLET | ORAL | 0 refills | Status: DC | PRN
Start: 2020-08-28 — End: 2020-10-27

## 2020-08-28 NOTE — Telephone Encounter (Signed)
Oral Oncology Patient Advocate Encounter  Received notification from [facility] that prior authorization for [name] is required.  PA submitted on CoverMyMeds Key [id number]  Status is pending  Oral Oncology Clinic will continue to follow.  [place] Patient [place] Phone [phone] Fax [phone] 08/28/2020 3:48 PM

## 2020-08-28 NOTE — Patient Instructions (Signed)
Kara Woods at [facility] Discharge Instructions  You were seen today by Dr. Delton Coombes. He went over your recent results; your bone biopsy shows metastatic breast cancer. You will have labs drawn today for further analysis. The medications that you will be prescribed for the breast cancer are Ibrance and Arimidex, taken daily. You will be prescribed gabapentin for the rib pain, along with the Norco every 4 hours. Given that the cancer has spread to the bone, you will be receiving monthly injections called Xgeva to strengthen your bones; side effects include low calcium levels and erosion of the jaw. Purchase a stool softener over the counter and take daily to prevent constipation. Drink 3-4 cans of Boost or Ensure daily to maintain your weight. Dr. Delton Coombes will see you back in 2 weeks for follow up.   Thank you for choosing [place] at [facility] to provide your oncology and hematology care.  To afford each patient quality time with our provider, please arrive at least 15 minutes before your scheduled appointment time.   If you have a lab appointment with the [facility] please come in thru the Main Entrance and check in at the main information desk  You need to re-schedule your appointment should you arrive 10 or more minutes late.  We strive to give you quality time with our providers, and arriving late affects you and other patients whose appointments are after yours.  Also, if you no show three or more times for appointments you may be dismissed from the clinic at the providers discretion.     Again, thank you for choosing [facility].  Our hope is that these requests will decrease the amount of time that you wait before being seen by our physicians.       _____________________________________________________________  Should you have questions after your visit to [facility], please contact our office at [phone]  between the hours of 8:00 a.m. and 4:30 p.m.  Voicemails left after 4:00 p.m. will not be returned until the following business day.  For prescription refill requests, have your pharmacy contact our office and allow 72 hours.    Cancer Center Support Programs:   > Cancer Support Group  2nd Tuesday of the month 1pm-2pm, Journey Room

## 2020-08-28 NOTE — Telephone Encounter (Signed)
Oral Oncology Patient Advocate Encounter  Prior Authorization for [name] has been approved.    PA# [id number] Effective dates: 05/30/20 through 08/28/21  Patients co-pay is $2533.96.  Oral Oncology Clinic will continue to follow.   [place] Patient [place] Phone [phone] Fax [phone] 08/28/2020 4:03 PM

## 2020-08-28 NOTE — Progress Notes (Signed)
[facility] [address], [place] [address]   CLINIC:  Medical Oncology/Hematology  PCP:  Celene Squibb, MD [address] [place] [address] [phone]   REASON FOR VISIT:  Follow-up for metastatic left breast cancer  PRIOR THERAPY:  1. Left lumpectomy in 04/2013. 2. Radiation therapy.  NGS Results: ER/PR positive, HER-2 negative, Ki-67 10%  CURRENT THERAPY: Under work-up  BRIEF ONCOLOGIC HISTORY:  Oncology History   No history exists.    CANCER STAGING: Cancer Staging History of breast cancer Staging form: Breast, AJCC 7th Edition - Clinical stage from 04/20/2013: Stage IIA (T2, N0, cM0) - Unsigned - Pathologic: No stage assigned - Unsigned   INTERVAL HISTORY:  [name], a 86 y.o. female, returns for routine follow-up of her left breast cancer. Katheryn was last seen on 08/14/2020.  Today she is accompanied by her daughter. She reports having excruciating sharp pain alternating in her left and right rib area, requiring Norco every 4 hours, though the last 2 days the rib pain has been hardly alleviated with the norco. She reportedly gets a little drowsy with the Norco and she is also constipated. She denies having any numbness or tingling in her extremities. Her appetite is severely decreased due to decreased satiety, though she has not lost her taste, and she eats only a half portion from her baseline.   She has a history of dental implants which would not take to the bone, so she has dentures in her upper jaw and native teeth in her lower jaw; she denies having infections in her teeth or jawbone. She has become more weak since the last visit and is not ambulating as much as before. Her brother had bone cancer.   REVIEW OF SYSTEMS:  Review of Systems  Constitutional: Positive for appetite change (50%) and fatigue (depleted).  Respiratory: Positive for shortness of breath.   Cardiovascular: Positive for chest pain (9/10  alternating L & R anterior rib pain).  Gastrointestinal: Positive for constipation, diarrhea and nausea (occasional).  Neurological: Negative for numbness.  All other systems reviewed and are negative.   PAST MEDICAL/SURGICAL HISTORY:  Past Medical History:  Diagnosis Date  . Anxiety   . Arthritis   . Blind left eye   . Breast cancer ([place])    left   . Colitis   . Coronary artery disease    a. 10/2016 Staged PCI: RCA 50p, 61m(2.25x12 Resolute Onyx DES), LCX 50p, 885m2.75x23 Xience Alpine DES). Residual LAD 50p/m, 4530m  . CMarland Kitchenstocele 10/11/2013  . Depression   . Diastolic dysfunction    a. 09/2016 Echo: EF 50%, diffuse hypokinesis, mild LVH, grade 1 diastolic dysfunction, mild mitral regurgitation, mildly dilated left atrium.  . GMarland KitchenRD (gastroesophageal reflux disease)    occasional Tums only  . HOH (hard of hearing)   . Pelvic relaxation 10/11/2013  . Proctitis    colonsocopy 2007, Canasa suppositories  . S/P endoscopy March 2009   mild erosive reflux esophagitis, Schatzki's ring, s/p dilation  . Schatzki's ring   . Shortness of breath    occ if anxiety  . Wears dentures    upper  . Wears glasses    Past Surgical History:  Procedure Laterality Date  . ABDOMINAL HYSTERECTOMY    . ANTERIOR AND POSTERIOR REPAIR N/A 05/17/2014   Procedure: ANTERIOR (CYSTOCELE) AND POSTERIOR REPAIR (RECTOCELE);  Surgeon: ScoReece PackerD;  [facility];  Service: Urology;  Laterality: N/A;  .  APPENDECTOMY    . BREAST LUMPECTOMY WITH NEEDLE LOCALIZATION AND AXILLARY SENTINEL LYMPH NODE BX Left 05/03/2013   Procedure: BREAST LUMPECTOMY WITH NEEDLE LOCALIZATION AND AXILLARY SENTINEL LYMPH NODE BX;  Surgeon: Edward Jolly, MD;  Location: [facility];  Service: General;  Laterality: Left;  . BREAST SURGERY Left 05/19/13  . CARDIAC CATHETERIZATION  1998  . CARDIAC CATHETERIZATION N/A 11/04/2016   Procedure: Left Heart Cath and Coronary Angiography;  Surgeon: Troy Sine, MD;  Location: [facility];  Service: Cardiovascular;  Laterality: N/A;  . CARDIAC CATHETERIZATION N/A 11/05/2016   Procedure: Coronary Stent Intervention;  Surgeon: Troy Sine, MD;  Location: [facility];  Service: Cardiovascular;  Laterality: N/A;  . CHOLECYSTECTOMY    . COLONOSCOPY  05/06/2006   Diffuse inflammatory changes of the rectal mucosa, consistent  with proctitis.  Otherwise, normal colon to terminal ileum  . CORONARY ANGIOPLASTY  11/04/2016  . CORONARY STENT PLACEMENT     Drug-eluting coronary artery stent, non-bioabsorbable-polymer-coated  . CYSTOSCOPY N/A 05/17/2014   Procedure: CYSTOSCOPY;  Surgeon: Reece Packer, MD;  Location: [facility];  Service: Urology;  Laterality: N/A;  . ESOPHAGOGASTRODUODENOSCOPY  02/09/2008   Schatzki ring status post dilation/Distal esophageal erosion consistent with mild erosive reflux  esophagitis, otherwise unremarkable esophagus, normal stomach, D1, D2.  . EYE SURGERY     lt cataract-implant  . EYE SURGERY     lt-lens repaced,l  . [place] SURGERY  1993  . RE-EXCISION OF BREAST LUMPECTOMY Left 05/19/2013   Procedure: RE-EXCISION OF BREAST LUMPECTOMY;  Surgeon: Edward Jolly, MD;  Location: [facility];  Service: General;  Laterality: Left;  . RETINAL DETACHMENT SURGERY  1978   Left  . VAGINAL HYSTERECTOMY Bilateral 05/17/2014   Procedure: HYSTERECTOMY VAGINAL with Bilateral Salpingo-Oophorectomy; Bladder cystotomy repair;  Surgeon: Marvene Staff, MD;  Location: [facility];  Service: Gynecology;  Laterality: Bilateral;  . VAGINAL PROLAPSE REPAIR N/A 05/17/2014   Procedure: VAGINAL VAULT PROLAPSE AND GRAFT;  Surgeon: Reece Packer, MD;  Location: [facility];  Service: Urology;  Laterality: N/A;    SOCIAL HISTORY:  Social History   Socioeconomic History  . Marital status: Widowed    Spouse name: Not on file  . Number of children: 6  . Years of education: Not on file  . Highest education  level: Not on file  Occupational History  . Occupation: Retired  Tobacco Use  . Smoking status: Former Smoker    Packs/day: 1.00    Types: Cigarettes    Quit date: 04/28/1990    Years since quitting: 30.3  . Smokeless tobacco: Never Used  . Tobacco comment: quit 28 yrs ago  Substance and Sexual Activity  . Alcohol use: Yes    Alcohol/week: 1.0 standard drink    Types: 1 Glasses of wine per week    Comment: occ  . Drug use: No  . Sexual activity: Not Currently    Birth control/protection: Post-menopausal  Other Topics Concern  . Not on file  Social History Narrative  . Not on file   Social Determinants of Health   Financial Resource Strain:   . Difficulty of Paying Living Expenses: Not on file  Food Insecurity:   . Worried About Charity fundraiser in the Last Year: Not on file  . Ran Out of Food in the Last Year: Not on file  Transportation Needs:   . Lack of Transportation (Medical): Not on file  .  Lack of Transportation (Non-Medical): Not on file  Physical Activity:   . Days of Exercise per Week: Not on file  . Minutes of Exercise per Session: Not on file  Stress:   . Feeling of Stress : Not on file  Social Connections:   . Frequency of Communication with Friends and Family: Not on file  . Frequency of Social Gatherings with Friends and Family: Not on file  . Attends Religious Services: Not on file  . Active Member of Clubs or Organizations: Not on file  . Attends Archivist Meetings: Not on file  . Marital Status: Not on file  Intimate Partner Violence:   . Fear of Current or Ex-Partner: Not on file  . Emotionally Abused: Not on file  . Physically Abused: Not on file  . Sexually Abused: Not on file    FAMILY HISTORY:  Family History  Problem Relation Age of Onset  . Cancer Brother        spinal  . Heart attack Mother   . Heart attack Father   . Heart attack Son   . Heart attack Son   . Depression Son   . Multiple myeloma Daughter   . Anemia  Daughter   . Colon cancer Neg Hx     CURRENT MEDICATIONS:  Current Outpatient Medications  Medication Sig Dispense Refill  . acetaminophen (TYLENOL) 500 MG tablet Take 500 mg by mouth every 6 (six) hours as needed for mild pain.     Marland Kitchen aspirin EC 81 MG tablet Take 1 tablet (81 mg total) by mouth daily with breakfast. 90 tablet 3  . carboxymethylcellulose (REFRESH PLUS) 0.5 % SOLN Place 3-4 drops into both eyes 3 (three) times daily as needed (dry eyes). Preservative free    . clopidogrel (PLAVIX) 75 MG tablet Take 75 mg by mouth daily.     . clorazepate (TRANXENE) 15 MG tablet Take 7.5 mg by mouth daily. May take an additional 7.5 mg if needed    . ezetimibe (ZETIA) 10 MG tablet Take 10 mg by mouth daily.    Marland Kitchen HYDROcodone-acetaminophen (NORCO) 5-325 MG tablet Take 1 tablet by mouth every 6 (six) hours as needed for moderate pain. (Patient taking differently: Take 1 tablet by mouth every 4 (four) hours as needed for moderate pain. ) 120 tablet 0  . latanoprost (XALATAN) 0.005 % ophthalmic solution Place 1 drop into both eyes at bedtime.    . metoprolol tartrate (LOPRESSOR) 25 MG tablet TAKE 1/2 TABLET BY MOUTH TWICE A DAY (Patient taking differently: Take 12.5 mg by mouth 2 (two) times daily. ) 90 tablet 3  . nitrofurantoin, macrocrystal-monohydrate, (MACROBID) 100 MG capsule Take 100 mg by mouth 2 (two) times daily.    . nitroGLYCERIN (NITROSTAT) 0.4 MG SL tablet Place 1 tablet (0.4 mg total) under the tongue every 5 (five) minutes as needed. (Patient not taking: Reported on 08/28/2020) 25 tablet 3   No current facility-administered medications for this visit.    ALLERGIES:  Allergies  Allergen Reactions  . Contrast Media [Iodinated Diagnostic Agents] Anaphylaxis    Adverse reaction; pt hyperventilating, c/o CP and SOB   . Lipitor [Atorvastatin] Other (See Comments)    Muscle aches, cramps, fatigue, weight loss/poor appetite  . Sulfamethoxazole Other (See Comments)    Reaction was years  ago unknown  . Sulfonamide Derivatives Other (See Comments)    Dizziness     PHYSICAL EXAM:  Performance status (ECOG): 1 - Symptomatic but completely ambulatory  Vitals:  08/28/20 1345  BP: 134/74  Pulse: 96  Resp: 18  Temp: (!) 97.2 F (36.2 C)  SpO2: 95%   Wt Readings from Last 3 Encounters:  08/28/20 160 lb 9.6 oz (72.8 kg)  08/22/20 160 lb (72.6 kg)  08/14/20 161 lb 6.4 oz (73.2 kg)   Physical Exam Vitals reviewed.  Constitutional:      Appearance: Normal appearance.  Neurological:     General: No focal deficit present.     Mental Status: She is alert and oriented to person, place, and time.  Psychiatric:        Mood and Affect: Mood normal.        Behavior: Behavior normal.      LABORATORY DATA:  I have reviewed the labs as listed.  CBC Latest Ref Rng & Units 08/22/2020 07/24/2020 07/24/2020  WBC 4.0 - 10.5 K/uL 7.5 - 10.5  Hemoglobin 12.0 - 15.0 g/dL 13.1 13.9 13.0  Hematocrit 36 - 46 % 40.8 41.0 41.5  Platelets 150 - 400 K/uL 293 - 226   CMP Latest Ref Rng & Units 07/24/2020 07/24/2020 09/01/2019  Glucose 70 - 99 mg/dL 90 94 101(H)  BUN 8 - 23 mg/dL 19 20 11   Creatinine 0.44 - 1.00 mg/dL 0.70 0.74 0.73  Sodium 135 - 145 mmol/L 141 137 140  Potassium 3.5 - 5.1 mmol/L 4.2 4.0 4.1  Chloride 98 - 111 mmol/L 102 103 106  CO2 22 - 32 mmol/L - 24 25  Calcium 8.9 - 10.3 mg/dL - 9.0 8.9  Total Protein 6.5 - 8.1 g/dL - 7.3 -  Total Bilirubin 0.3 - 1.2 mg/dL - 0.6 -  Alkaline Phos 38 - 126 U/L - 68 -  AST 15 - 41 U/L - 18 -  ALT 0 - 44 U/L - 16 -   Surgical pathology ([id number]) on 08/22/2020: Right third rib bone biopsy: metastatic carcinoma.  DIAGNOSTIC IMAGING:  I have independently reviewed the scans and discussed with the patient. MR THORACIC SPINE WO CONTRAST  Result Date: 08/15/2020 CLINICAL DATA:  Breast cancer staging. Multiple osseous metastases on recent PET-CT, with involvement of the thoracic spine. EXAM: MRI THORACIC SPINE WITHOUT CONTRAST  TECHNIQUE: Multiplanar, multisequence MR imaging of the thoracic spine was performed. No intravenous contrast was administered. Patient refused contrast. COMPARISON:  PET-CT 08/07/2020. FINDINGS: Alignment:  Physiologic. Vertebrae: As seen on recent PET-CT, there are multiple osseous metastases within the thoracic spine and posterior ribs bilaterally. There is near complete replacement of the T1 vertebral body by tumor which extends into the posterior elements. There is associated pathologic fracture and mild osseous retropulsion, but no cord deformity. There are metastases within the posterior elements at T3 and T4, best seen on the inversion recovery images. There is a small lesion in the superior endplate of T7 with a mild pathologic fracture. Cord:  The thoracic cord appears normal in signal and caliber. Paraspinal and other soft tissues: Possible paraspinal soft tissue tumor on the left at T1, further described below. No other significant paraspinal findings. Disc levels: T7-T1: T1 tumor extension into the left pedicle contributes to left foraminal narrowing and possible left C8 nerve root encroachment. No cord deformity. T1-2: Mild left foraminal narrowing due to tumor in the left T1 pedicle. No cord deformity. No other evidence of significant epidural or paraspinal tumor. Mild disc bulging throughout the thoracic spine with wide patency of the spinal canal. IMPRESSION: 1. As seen on recent PET-CT, there are multiple osseous metastases within the thoracic spine and posterior  ribs bilaterally. 2. Associated pathologic fracture of T1 with mild osseous retropulsion and possible left C8 nerve root encroachment within the foramen at C7-T1. No cord deformity. 3. Mild pathologic fracture of the superior endplate of T7. 4. No significant epidural or paraspinal tumor at the other levels. Electronically Signed   By: Richardean Sale M.D.   On: 08/15/2020 15:38   MR LUMBAR SPINE WO CONTRAST  Result Date:  08/15/2020 CLINICAL DATA:  Metastatic breast cancer with known osseous metastases on PET-CT. EXAM: MRI LUMBAR SPINE WITHOUT CONTRAST TECHNIQUE: Multiplanar, multisequence MR imaging of the lumbar spine was performed. No intravenous contrast was administered. Patient refused contrast. COMPARISON:  PET-CT 08/07/2020.  Abdominopelvic CT 11/19/2016. FINDINGS: Segmentation: Conventional anatomy assumed, with the last open disc space designated L5-S1.Concordant with previous imaging. Alignment: Convex left scoliosis centered at L2-3. Degenerative grade 1 anterolisthesis at L5-S1. Vertebrae: As seen on recent PET-CT, there are multiple osseous metastases within the lumbar spine and upper sacrum. Lesions are noted within the left L1 pedicle, the left aspect of the L3 vertebral body, the central aspect of the L4 vertebral body, the right aspect of the L5 vertebral body and the upper sacrum. No evidence of pathologic fracture or epidural tumor. Lesions in the iliac bones are partially imaged. The sacroiliac joints appear unremarkable. Conus medullaris: Extends to the L1-2 level and appears normal. Paraspinal and other soft tissues: No significant paraspinal findings. Disc levels: T12-L1: Loss of disc height with annular disc bulging and endplate osteophytes. No significant spinal stenosis or nerve root encroachment. L1-2: Loss of disc height with annular disc bulging and endplate osteophytes asymmetric to the right. Asymmetric right-sided facet hypertrophy. Resulting mild to moderate right lateral recess and right foraminal narrowing. L2-3: Loss of disc height with annular disc bulging and endplate osteophytes asymmetric to the right. Mild bilateral facet hypertrophy. Mild right lateral recess and right foraminal narrowing. L3-4: Chronic degenerative disc disease with loss of disc height, annular disc bulging and endplate osteophytes. Mild bilateral facet hypertrophy. Mild narrowing of the lateral recesses and foramina  bilaterally. L4-5: Previous posterior decompression with wide patency of the spinal canal and lateral recesses. Chronic degenerative disc disease with loss of disc height and endplate osteophytes. Mild foraminal narrowing bilaterally. L5-S1: Relatively preserved disc height with mild disc bulging and moderate bilateral facet hypertrophy. Mild foraminal narrowing bilaterally. IMPRESSION: 1. Multilevel osseous metastases within the lumbar spine and upper pelvis as seen on recent PET-CT. No evidence of pathologic fracture or epidural tumor. 2. Multilevel spondylosis with disc bulging, endplate osteophytes and facet hypertrophy as described. No high-grade spinal stenosis. Mild to moderate lateral recess and foraminal narrowing as detailed above. 3. Previous posterior decompression at L4-5. Electronically Signed   By: Richardean Sale M.D.   On: 08/15/2020 15:56   NM PET Image Initial (PI) Skull Base To Thigh  Result Date: 08/08/2020 CLINICAL DATA:  Subsequent treatment strategy for left sided lumpectomy 7 years ago for breast cancer. Restaging. Elevated Cea. COVID vaccine in left arm 6/21. EXAM: NUCLEAR MEDICINE PET SKULL BASE TO THIGH TECHNIQUE: 9.2 mCi F-18 FDG was injected intravenously. Full-ring PET imaging was performed from the skull base to thigh after the radiotracer. CT data was obtained and used for attenuation correction and anatomic localization. Fasting blood glucose: 92 mg/dl COMPARISON:  11/19/2016 abdominopelvic CT. CTA chest 09/01/2016. FINDINGS: Mediastinal blood pool activity: SUV max 2.1 Liver activity: SUV max NA NECK: No areas of abnormal hypermetabolism. Incidental CT findings: No cervical adenopathy. Advanced bilateral carotid atherosclerosis. Surgical changes  about the left globe. CHEST: Hypermetabolic thoracic adenopathy. Right paratracheal 1.5 cm node measures a S.U.V. max of 5.9 on 92/3. Adenopathy within the prevascular and left internal mammary spaces including at 2.4 x 2.1 cm and a  S.U.V. max of 6.9 on 98/3. Incidental CT findings: Aortic and coronary artery atherosclerosis. Mild cardiomegaly. Centrilobular emphysema. ABDOMEN/PELVIS: No abdominopelvic parenchymal or nodal hypermetabolism. Incidental CT findings: Normal adrenal glands. Cholecystectomy. Abdominal aortic atherosclerosis. Hysterectomy. Pelvic floor laxity. SKELETON: Widespread hypermetabolic osseous metastasis. Right posterior third rib expansile hypermetabolic lesion on the order of 3.8 cm and a S.U.V. max of 8.5 on 78/3. A T1 lytic lesion with probable extension into the canal measures on the order of 2.8 cm and a S.U.V. max of 10.8 on 64/3. Right femoral neck lytic lesion measures 1.8 cm and a S.U.V. max of 7.5 on 295/3. A sacral osseous and soft tissue density lesion in the midline measures 2.6 x 2.0 cm and a S.U.V. max of 7.2 on 230/3. Incidental CT findings: none IMPRESSION: 1. Widespread hypermetabolic osseous metastasis, including lesions with soft tissue components at T1 and the sacrum. Consider pre and postcontrast thoracic spine MRI. Lumbosacral spine imaging could be performed based on clinical symptoms. 2. Thoracic nodal metastasis. No other evidence of soft tissue metastasis. 3. Incidental findings, including: Aortic atherosclerosis (ICD10-I70.0), coronary artery atherosclerosis and emphysema (ICD10-J43.9). Pelvic floor laxity. Electronically Signed   By: Abigail Miyamoto M.D.   On: 08/08/2020 11:28   CT Biopsy  Result Date: 08/22/2020 INDICATION: 86 year old female referred for biopsy of soft tissue lesion involving the posterior right ribs EXAM: CT BIOPSY MEDICATIONS: None. ANESTHESIA/SEDATION: Moderate (conscious) sedation was employed during this procedure. A total of Versed 1.0 mg and Fentanyl 50 mcg was administered intravenously. Moderate Sedation Time: 16 minutes. The patient's level of consciousness and vital signs were monitored continuously by radiology nursing throughout the procedure under my direct  supervision. FLUOROSCOPY TIME:  CT COMPLICATIONS: None PROCEDURE: Informed written consent was obtained from the patient after a thorough discussion of the procedural risks, benefits and alternatives. All questions were addressed. Maximal Sterile Barrier Technique was utilized including caps, mask, sterile gowns, sterile gloves, sterile drape, hand hygiene and skin antiseptic. A timeout was performed prior to the initiation of the procedure. Patient was position prone position on the CT gantry table. Scout CT was acquired for planning purposes. Patient is prepped and draped in the usual sterile fashion. 1% lidocaine was used for local anesthesia. Using CT guidance 17 gauge guide needle was advanced in a parallel manner into the soft tissue lesion of the posterior right fourth rib. Once we confirmed needle tip position, multiple 18 gauge core biopsy were acquired. Only scant amount of material was acquired despite multiple core biopsy. Needle was removed and a final CT image was performed. Sample was sent to the lab in formalin. Patient tolerated the procedure well and remained hemodynamically stable throughout. No complications were encountered and no significant blood loss. IMPRESSION: Status post CT-guided biopsy of posterior right fourth rib soft tissue lesion. Signed, Dulcy Fanny. Dellia Nims, RPVI [facility] Radiology Electronically Signed   By: Corrie Mckusick D.O.   On: 08/22/2020 13:04     ASSESSMENT:  1. T2N0 left breast infiltrating lobular carcinoma: -Status post lumpectomy in June 2014, 1.4 cm, grade 1, lymphovascular invasion positive, ER 100%, PR 26%, Ki-67 18%. -She underwent XRT.  She did not take adjuvant endocrine therapy. -Mammogram on 07/13/2020, BI-RADS Category 1. -CEA was 10.3, Ca1 2515.8 and  CA 19-9 05. -PET scan on 08/07/2020 shows widespread hypermetabolic bone meta stasis with soft tissue components at T1 and sacrum.  Thoracic nodal  metastasis. -MRI of the thoracic spine on 08/15/2020 shows pathological fracture of T1 with mild osseous retropulsion and possible left C8 nerve root encroachment within the foramen at C7-T1.  No cord deformity. -Right third rib biopsy on 08/22/2020 consistent with metastatic breast cancer, ER 100% positive, PR 40% positive, Ki-67 10%, HER-2 2+ and negative by FISH.  2. Social/family history: -She quit smoking 20 years ago, smoked 2 packs/day for more than 20 years prior to quitting. -She lives at home by herself and is independent of all activities. -Daughter who has multiple myeloma at age 35.   PLAN:  1.  Metastatic cancer to the bones, highly likely breast cancer: -We talked about the normal prognosis of metastatic breast cancer. -We also talked about MRI of the thoracic spine findings.  No clinical signs or symptoms of C8 nerve root encroachment. -We talked about treatment with CDK 4 inhibitor along with antiestrogen therapy. -We will start her on anastrozole and Ibrance.  Will start Ibrance at 100 mg 3 weeks on 1 week off.  If she tolerates well will increase to 125 mg. -We will obtain baseline labs today along with tumor markers. -Plan to repeat scans after 3 months. -RTC 2 weeks.  2. CAD and RCA stenting: -Continue metoprolol and Plavix.  3.  Bilateral rib pains: -This pain has gotten worse in the last week. -We will start her on gabapentin 100 mg 3 times a day. -Continue Norco 5/325 every 4 hours as needed.  4.  Bone metastasis: -I talked about Xgeva injections.  We discussed side effects. -She will have dental work done and will start after that.   Orders placed this encounter:  No orders of the defined types were placed in this encounter.    Derek Jack, MD [place] [phone]   I, Milinda Antis, am acting as a scribe for Dr. Sanda Linger.  I, Derek Jack MD, have reviewed the above documentation for accuracy and  completeness, and I agree with the above.

## 2020-08-28 NOTE — Telephone Encounter (Signed)
Oral Oncology Pharmacist Encounter  Received new prescription for Ibrance (palbociclib) for the treatment of metastatic breast cancer, ER/PR positive, HER2 negative, in conjunction with anastrozole, planned duration until disease progression or unacceptable drug toxicity.  CMP from 07/24/20 assessed, no relevant lab abnormalities. Prescription dose and frequency assessed.   Current medication list in Epic reviewed, no DDIs with palbociclib identified:  Evaluated chart and no patient barriers to medication adherence identified. It appears that patient's daughter are involved in her cancer care.  Prescription has been e-scribed to the [facility] for benefits analysis and approval.  Oral Oncology Clinic will continue to follow for insurance authorization, copayment issues, initial counseling and start date.  Darl Pikes, PharmD, BCPS, BCOP, CPP Hematology/Oncology Clinical Pharmacist Practitioner [facility] [phone]  08/28/2020 3:38 PM

## 2020-08-29 ENCOUNTER — Ambulatory Visit (HOSPITAL_COMMUNITY): Payer: Medicare Other | Admitting: Hematology

## 2020-08-29 LAB — CANCER ANTIGEN 27.29: CA 27.29: 57.1 U/mL — ABNORMAL HIGH (ref 0.0–38.6)

## 2020-08-29 LAB — CANCER ANTIGEN 15-3: CA 15-3: 51.3 U/mL — ABNORMAL HIGH (ref 0.0–25.0)

## 2020-08-29 MED FILL — IBRANCE 100 MG TABS: 100 | 21 days supply | Qty: 21 | Fill #0

## 2020-08-29 NOTE — Telephone Encounter (Signed)
Oral Chemotherapy Pharmacist Encounter  Patient will fill through [facility] using a one time voucher due to a high copay while manufacturer assistance is pending. Expected delivery 08/30/20.   Patient Education I spoke with patient's daughter Kara Woods for overview of new oral chemotherapy medication: Ibrance (palbociclib) for the treatment of metastatic breast cancer, ER/PR positive, HER2 negative, in conjunction with anastrozole, planned duration until disease progression or unacceptable drug toxicity.   Counseled Kara Woods on administration, dosing, side effects, monitoring, drug-food interactions, safe handling, storage, and disposal. Patient will take 1 tablet (100 mg total) by mouth daily. Take for 21 days on, 7 days off, repeat every 28 days.  Side effects include but not limited to: decreased wbc, fatigue, N/V, hair thinning.    Reviewed with Kara Woods importance of keeping a medication schedule and plan for any missed doses.  After discussion with Kara Woods no patient barriers to medication adherence identified. Kara Woods helps Kara Woods with Kara medication management and care.  Kara Woods voiced understanding and appreciation. All questions answered. Medication handout provided via email and mail.  Provided Kara Woods with [facility] phone number. Kara Woods knows to call the office with questions or concerns. Oral Chemotherapy Navigation Clinic will continue to follow.  Darl Pikes, PharmD, BCPS, BCOP, CPP Hematology/Oncology Clinical Pharmacist Practitioner [facility] [phone]  08/29/2020 11:11 AM

## 2020-08-30 NOTE — Telephone Encounter (Signed)
Oral Oncology Patient Advocate Encounter  I spoke with the patients daughter, Kara Woods, on 08/29/20 to set up delivery of Ibrance.  Address verified for shipment.  [name] will be filled through [facility] and mailed 08/29/20 for delivery 08/30/20.  [name] is receiving a free voucher for a 28 day supply from [facility] as a one time fill.  We are applying for manufacturer assistance in the meantime.     [name] will call 7-10 days before next refill is due to complete adherence call and set up delivery of medication.     [name] Phone [phone] Fax [phone] 08/30/2020 10:48 AM

## 2020-09-04 ENCOUNTER — Telehealth (HOSPITAL_COMMUNITY): Payer: Self-pay | Admitting: Pharmacy Technician

## 2020-09-04 NOTE — Telephone Encounter (Signed)
Oral Oncology Patient Advocate Encounter  Spoke with daughter, Maudie Mercury, on 08/29/20 to discuss high copay for Ibrance.  Foundation assistance is currently closed for her disease state, so we are going to help her apply for assistance from [employer].  I emailed the patient portion of the application to [employer].  She has received it and will have to call Social Security to get a copy of the Award Letter to send with the application. Once received she will send me the application and letter to be submitted.  I will continue to update this encounter.  [place] Patient [place] Phone [phone] Fax [phone] 09/04/2020 1:13 PM

## 2020-09-06 NOTE — Telephone Encounter (Signed)
Oral Oncology Patient Advocate Encounter  Coordinated with daughter, Maudie Mercury, to complete application for [employer] patient assistance in an effort to reduce patient's out of pocket expense for Ibrance to $0.    Application completed and faxed to [phone].   [employer] patient assistance phone number for follow up is [phone].   This encounter will be updated until final determination.   [place] Patient Kara Woods Phone [phone] Fax [phone] 09/06/2020 9:27 AM

## 2020-09-08 NOTE — Telephone Encounter (Signed)
Oral Oncology Patient Advocate Encounter  Received notification from [place] Patient Assistance program that patient has been successfully enrolled into their program to receive Ibrance from the manufacturer at $0 out of pocket until 11/24/20.    I called and spoke with patient's daughter Maudie Mercury.  She knows we will have to re-apply.   Specialty Pharmacy that will dispense medication is [facility].  Patient knows to call the office with questions or concerns.   Oral Oncology Clinic will continue to follow.  [place] Patient [place] Phone [phone] Fax [phone] 09/08/2020 2:19 PM

## 2020-09-13 ENCOUNTER — Other Ambulatory Visit: Payer: Self-pay

## 2020-09-13 ENCOUNTER — Inpatient Hospital Stay (HOSPITAL_COMMUNITY): Payer: Medicare Other

## 2020-09-13 ENCOUNTER — Inpatient Hospital Stay (HOSPITAL_BASED_OUTPATIENT_CLINIC_OR_DEPARTMENT_OTHER): Payer: Medicare Other | Admitting: Hematology

## 2020-09-13 VITALS — BP 116/71 | HR 82 | Temp 96.9°F | Resp 17 | Wt 162.5 lb

## 2020-09-13 DIAGNOSIS — R0781 Pleurodynia: Secondary | ICD-10-CM | POA: Diagnosis not present

## 2020-09-13 DIAGNOSIS — I2510 Atherosclerotic heart disease of native coronary artery without angina pectoris: Secondary | ICD-10-CM | POA: Diagnosis not present

## 2020-09-13 DIAGNOSIS — Z853 Personal history of malignant neoplasm of breast: Secondary | ICD-10-CM | POA: Diagnosis not present

## 2020-09-13 DIAGNOSIS — C50919 Malignant neoplasm of unspecified site of unspecified female breast: Secondary | ICD-10-CM

## 2020-09-13 DIAGNOSIS — C7951 Secondary malignant neoplasm of bone: Secondary | ICD-10-CM | POA: Diagnosis not present

## 2020-09-13 DIAGNOSIS — Z23 Encounter for immunization: Secondary | ICD-10-CM | POA: Diagnosis not present

## 2020-09-13 DIAGNOSIS — Z923 Personal history of irradiation: Secondary | ICD-10-CM | POA: Diagnosis not present

## 2020-09-13 LAB — CBC WITH DIFFERENTIAL/PLATELET
Abs Immature Granulocytes: 0.03 10*3/uL (ref 0.00–0.07)
Basophils Absolute: 0 10*3/uL (ref 0.0–0.1)
Basophils Relative: 1 %
Eosinophils Absolute: 0.1 10*3/uL (ref 0.0–0.5)
Eosinophils Relative: 3 %
HCT: 35.2 % — ABNORMAL LOW (ref 36.0–46.0)
Hemoglobin: 11.5 g/dL — ABNORMAL LOW (ref 12.0–15.0)
Immature Granulocytes: 1 %
Lymphocytes Relative: 47 %
Lymphs Abs: 1.9 10*3/uL (ref 0.7–4.0)
MCH: 30.6 pg (ref 26.0–34.0)
MCHC: 32.7 g/dL (ref 30.0–36.0)
MCV: 93.6 fL (ref 80.0–100.0)
Monocytes Absolute: 0.2 10*3/uL (ref 0.1–1.0)
Monocytes Relative: 4 %
Neutro Abs: 1.8 10*3/uL (ref 1.7–7.7)
Neutrophils Relative %: 44 %
Platelets: 206 10*3/uL (ref 150–400)
RBC: 3.76 MIL/uL — ABNORMAL LOW (ref 3.87–5.11)
RDW: 13.5 % (ref 11.5–15.5)
WBC: 4.1 10*3/uL (ref 4.0–10.5)
nRBC: 0 % (ref 0.0–0.2)

## 2020-09-13 LAB — COMPREHENSIVE METABOLIC PANEL
ALT: 14 U/L (ref 0–44)
AST: 15 U/L (ref 15–41)
Albumin: 3.7 g/dL (ref 3.5–5.0)
Alkaline Phosphatase: 64 U/L (ref 38–126)
Anion gap: 11 (ref 5–15)
BUN: 13 mg/dL (ref 8–23)
CO2: 28 mmol/L (ref 22–32)
Calcium: 9 mg/dL (ref 8.9–10.3)
Chloride: 100 mmol/L (ref 98–111)
Creatinine, Ser: 0.93 mg/dL (ref 0.44–1.00)
GFR, Estimated: 56 mL/min — ABNORMAL LOW (ref >60)
Glucose, Bld: 91 mg/dL (ref 70–99)
Potassium: 4.2 mmol/L (ref 3.5–5.1)
Sodium: 139 mmol/L (ref 135–145)
Total Bilirubin: 0.6 mg/dL (ref 0.3–1.2)
Total Protein: 7.2 g/dL (ref 6.5–8.1)

## 2020-09-13 MED ORDER — INFLUENZA VAC A&B SA ADJ QUAD 0.5 ML IM PRSY
PREFILLED_SYRINGE | INTRAMUSCULAR | Status: AC
  Filled 2020-09-13: qty 0.5

## 2020-09-13 MED ORDER — INFLUENZA VAC A&B SA ADJ QUAD 0.5 ML IM PRSY
0.5000 mL | PREFILLED_SYRINGE | Freq: Once | INTRAMUSCULAR | Status: AC
  Administered 2020-09-13: 0.5 mL via INTRAMUSCULAR

## 2020-09-13 NOTE — Progress Notes (Signed)
[facility] [address] [place] [address]   CLINIC:  Medical Oncology/Hematology  PCP:  Celene Squibb, MD [address] Liana Cro[place] [address] [phone]   REASON FOR VISIT:  Follow-up for metastatic left breast cancer  PRIOR THERAPY:  1. Left lumpectomy in 04/2013. 2. Radiation therapy.  NGS Results: ER/PR positive, HER-2 negative, Ki-67 10%  CURRENT THERAPY: Arimidex daily  BRIEF ONCOLOGIC HISTORY:  Oncology History   No history exists.    CANCER STAGING: Cancer Staging History of breast cancer Staging form: Breast, AJCC 7th Edition - Clinical stage from 04/20/2013: Stage IIA (T2, N0, cM0) - Unsigned - Pathologic: No stage assigned - Unsigned   INTERVAL HISTORY:  [name], a 86 y.o. female, returns for routine follow-up of her metastatic left breast cancer. Creta was last seen on 08/28/2020.  Today she is accompanied by her daughter and she reports feeling well. She has been taking Arimidex and Ibrance for 2 weeks as of 10/21. She reports feeling slightly tired since starting Ibrance. Her appetite is down to 25%. She takes Norco every 4 hours and gabapentin BID for her rib pains; her daughter reports that she tried taking Norco every 5 hours but the pain becomes unbearable. She reports being constipated and is drinking prune juice, but denies nausea or abdominal pain; she is drinking 2-3 cans of Boost depending on how much she eats. She complains of having a sore on her right ear which hurts when she sleeps and is red when she wakes up.  She is open to getting her flu shot today.  REVIEW OF SYSTEMS:  Review of Systems  Constitutional: Positive for appetite change (25%) and fatigue (50%).  Gastrointestinal: Positive for constipation (on prune juice). Negative for abdominal pain and nausea.  All other systems reviewed and are negative.   PAST MEDICAL/SURGICAL HISTORY:  Past Medical History:  Diagnosis Date  . Anxiety     . Arthritis   . Blind left eye   . Breast cancer ([facility])    left   . Colitis   . Coronary artery disease    a. 10/2016 Staged PCI: RCA 50p, 88m(2.25x12 Resolute Onyx DES), LCX 50p, 836m2.75x23 Xience Alpine DES). Residual LAD 50p/m, 4523m  . CMarland Kitchenstocele 10/11/2013  . Depression   . Diastolic dysfunction    a. 09/2016 Echo: EF 50%, diffuse hypokinesis, mild LVH, grade 1 diastolic dysfunction, mild mitral regurgitation, mildly dilated left atrium.  . GMarland KitchenRD (gastroesophageal reflux disease)    occasional Tums only  . HOH (hard of hearing)   . Pelvic relaxation 10/11/2013  . Proctitis    colonsocopy 2007, Canasa suppositories  . S/P endoscopy March 2009   mild erosive reflux esophagitis, Schatzki's ring, s/p dilation  . Schatzki's ring   . Shortness of breath    occ if anxiety  . Wears dentures    upper  . Wears glasses    Past Surgical History:  Procedure Laterality Date  . ABDOMINAL HYSTERECTOMY    . ANTERIOR AND POSTERIOR REPAIR N/A 05/17/2014   Procedure: ANTERIOR (CYSTOCELE) AND POSTERIOR REPAIR (RECTOCELE);  Surgeon: ScoReece PackerD;  [facility];  Service: Urology;  Laterality: N/A;  . APPENDECTOMY    . BREAST LUMPECTOMY WITH NEEDLE LOCALIZATION AND AXILLARY SENTINEL LYMPH NODE BX Left 05/03/2013   Procedure: BREAST LUMPECTOMY WITH NEEDLE LOCALIZATION AND AXILLARY SENTINEL LYMPH NODE BX;  Surgeon: BenEdward JollyD;  [facility]  Service: General;  Laterality: Left;  . BREAST SURGERY Left 05/19/13  . CARDIAC CATHETERIZATION  1998  . CARDIAC CATHETERIZATION N/A 11/04/2016   Procedure: Left Heart Cath and Coronary Angiography;  Surgeon: Troy Sine, MD;  Location: [facility];  Service: Cardiovascular;  Laterality: N/A;  . CARDIAC CATHETERIZATION N/A 11/05/2016   Procedure: Coronary Stent Intervention;  Surgeon: Troy Sine, MD;  Location: [facility];  Service: Cardiovascular;  Laterality: N/A;  . CHOLECYSTECTOMY     . COLONOSCOPY  05/06/2006   Diffuse inflammatory changes of the rectal mucosa, consistent  with proctitis.  Otherwise, normal colon to terminal ileum  . CORONARY ANGIOPLASTY  11/04/2016  . CORONARY STENT PLACEMENT     Drug-eluting coronary artery stent, non-bioabsorbable-polymer-coated  . CYSTOSCOPY N/A 05/17/2014   Procedure: CYSTOSCOPY;  Surgeon: Reece Packer, MD;  Location: [facility];  Service: Urology;  Laterality: N/A;  . ESOPHAGOGASTRODUODENOSCOPY  02/09/2008   Schatzki ring status post dilation/Distal esophageal erosion consistent with mild erosive reflux  esophagitis, otherwise unremarkable esophagus, normal stomach, D1, D2.  . EYE SURGERY     lt cataract-implant  . EYE SURGERY     lt-lens repaced,l  . [place] SURGERY  1993  . RE-EXCISION OF BREAST LUMPECTOMY Left 05/19/2013   Procedure: RE-EXCISION OF BREAST LUMPECTOMY;  Surgeon: Edward Jolly, MD;  Location: [facility];  Service: General;  Laterality: Left;  . RETINAL DETACHMENT SURGERY  1978   Left  . VAGINAL HYSTERECTOMY Bilateral 05/17/2014   Procedure: HYSTERECTOMY VAGINAL with Bilateral Salpingo-Oophorectomy; Bladder cystotomy repair;  Surgeon: Marvene Staff, MD;  Location: [facility];  Service: Gynecology;  Laterality: Bilateral;  . VAGINAL PROLAPSE REPAIR N/A 05/17/2014   Procedure: VAGINAL VAULT PROLAPSE AND GRAFT;  Surgeon: Reece Packer, MD;  Location: [facility];  Service: Urology;  Laterality: N/A;    SOCIAL HISTORY:  Social History   Socioeconomic History  . Marital status: Widowed    Spouse name: Not on file  . Number of children: 6  . Years of education: Not on file  . Highest education level: Not on file  Occupational History  . Occupation: Retired  Tobacco Use  . Smoking status: Former Smoker    Packs/day: 1.00    Types: Cigarettes    Quit date: 04/28/1990    Years since quitting: 30.4  . Smokeless tobacco: Never Used  . Tobacco comment: quit 28 yrs ago  Substance and  Sexual Activity  . Alcohol use: Yes    Alcohol/week: 1.0 standard drink    Types: 1 Glasses of wine per week    Comment: occ  . Drug use: No  . Sexual activity: Not Currently    Birth control/protection: Post-menopausal  Other Topics Concern  . Not on file  Social History Narrative  . Not on file   Social Determinants of Health   Financial Resource Strain:   . Difficulty of Paying Living Expenses: Not on file  Food Insecurity:   . Worried About Charity fundraiser in the Last Year: Not on file  . Ran Out of Food in the Last Year: Not on file  Transportation Needs:   . Lack of Transportation (Medical): Not on file  . Lack of Transportation (Non-Medical): Not on file  Physical Activity:   . Days of Exercise per Week: Not on file  . Minutes of Exercise per Session: Not on file  Stress:   . Feeling of Stress : Not on file  Social  Connections:   . Frequency of Communication with Friends and Family: Not on file  . Frequency of Social Gatherings with Friends and Family: Not on file  . Attends Religious Services: Not on file  . Active Member of Clubs or Organizations: Not on file  . Attends Archivist Meetings: Not on file  . Marital Status: Not on file  Intimate Partner Violence:   . Fear of Current or Ex-Partner: Not on file  . Emotionally Abused: Not on file  . Physically Abused: Not on file  . Sexually Abused: Not on file    FAMILY HISTORY:  Family History  Problem Relation Age of Onset  . Cancer Brother        spinal  . Heart attack Mother   . Heart attack Father   . Heart attack Son   . Heart attack Son   . Depression Son   . Multiple myeloma Daughter   . Anemia Daughter   . Colon cancer Neg Hx     CURRENT MEDICATIONS:  Current Outpatient Medications  Medication Sig Dispense Refill  . acetaminophen (TYLENOL) 500 MG tablet Take 500 mg by mouth every 6 (six) hours as needed for mild pain.     Marland Kitchen anastrozole (ARIMIDEX) 1 MG tablet Take 1 tablet (1 mg  total) by mouth daily. 30 tablet 6  . aspirin EC 81 MG tablet Take 1 tablet (81 mg total) by mouth daily with breakfast. 90 tablet 3  . carboxymethylcellulose (REFRESH PLUS) 0.5 % SOLN Place 3-4 drops into both eyes 3 (three) times daily as needed (dry eyes). Preservative free    . clopidogrel (PLAVIX) 75 MG tablet Take 75 mg by mouth daily.     . clorazepate (TRANXENE) 15 MG tablet Take 7.5 mg by mouth daily. May take an additional 7.5 mg if needed    . ezetimibe (ZETIA) 10 MG tablet Take 10 mg by mouth daily.    Marland Kitchen gabapentin (NEURONTIN) 100 MG capsule Take 1 capsule (100 mg total) by mouth 3 (three) times daily. 90 capsule 2  . HYDROcodone-acetaminophen (NORCO) 5-325 MG tablet Take 1 tablet by mouth every 4 (four) hours as needed for moderate pain. 180 tablet 0  . latanoprost (XALATAN) 0.005 % ophthalmic solution Place 1 drop into both eyes at bedtime.    . metoprolol tartrate (LOPRESSOR) 25 MG tablet TAKE 1/2 TABLET BY MOUTH TWICE A DAY (Patient taking differently: Take 12.5 mg by mouth 2 (two) times daily. ) 90 tablet 3  . nitrofurantoin, macrocrystal-monohydrate, (MACROBID) 100 MG capsule Take 100 mg by mouth 2 (two) times daily.    . nitroGLYCERIN (NITROSTAT) 0.4 MG SL tablet Place 1 tablet (0.4 mg total) under the tongue every 5 (five) minutes as needed. 25 tablet 3  . palbociclib (IBRANCE) 100 MG tablet Take 1 tablet (100 mg total) by mouth daily. Take for 21 days on, 7 days off, repeat every 28 days. 21 tablet 1   Current Facility-Administered Medications  Medication Dose Route Frequency Provider Last Rate Last Admin  . influenza vaccine adjuvanted (FLUAD) injection 0.5 mL  0.5 mL Intramuscular Once Derek Jack, MD        ALLERGIES:  Allergies  Allergen Reactions  . Contrast Media [Iodinated Diagnostic Agents] Anaphylaxis    Adverse reaction; pt hyperventilating, c/o CP and SOB   . Lipitor [Atorvastatin] Other (See Comments)    Muscle aches, cramps, fatigue, weight  loss/poor appetite  . Sulfamethoxazole Other (See Comments)    Reaction was years ago unknown  .  Sulfonamide Derivatives Other (See Comments)    Dizziness     PHYSICAL EXAM:  Performance status (ECOG): 1 - Symptomatic but completely ambulatory  Vitals:   09/13/20 1033  BP: 116/71  Pulse: 82  Resp: 17  Temp: (!) 96.9 F (36.1 C)  SpO2: 95%   Wt Readings from Last 3 Encounters:  09/13/20 162 lb 8 oz (73.7 kg)  08/28/20 160 lb 9.6 oz (72.8 kg)  08/22/20 160 lb (72.6 kg)   Physical Exam Vitals reviewed.  Constitutional:      Appearance: Normal appearance.  HENT:     Head:   Cardiovascular:     Rate and Rhythm: Normal rate and regular rhythm.     Pulses: Normal pulses.     Heart sounds: Normal heart sounds.  Pulmonary:     Effort: Pulmonary effort is normal.     Breath sounds: Normal breath sounds.  Abdominal:     Palpations: Abdomen is soft. There is no mass.     Tenderness: There is no abdominal tenderness.  Neurological:     General: No focal deficit present.     Mental Status: She is alert and oriented to person, place, and time.  Psychiatric:        Mood and Affect: Mood normal.        Behavior: Behavior normal.      LABORATORY DATA:  I have reviewed the labs as listed.  CBC Latest Ref Rng & Units 08/28/2020 08/22/2020 07/24/2020  WBC 4.0 - 10.5 K/uL 8.6 7.5 -  Hemoglobin 12.0 - 15.0 g/dL 13.1 13.1 13.9  Hematocrit 36 - 46 % 41.5 40.8 41.0  Platelets 150 - 400 K/uL 270 293 -   CMP Latest Ref Rng & Units 08/28/2020 07/24/2020 07/24/2020  Glucose 70 - 99 mg/dL 105(H) 90 94  BUN 8 - 23 mg/dL 16 19 20   Creatinine 0.44 - 1.00 mg/dL 0.68 0.70 0.74  Sodium 135 - 145 mmol/L 136 141 137  Potassium 3.5 - 5.1 mmol/L 4.0 4.2 4.0  Chloride 98 - 111 mmol/L 100 102 103  CO2 22 - 32 mmol/L 26 - 24  Calcium 8.9 - 10.3 mg/dL 9.3 - 9.0  Total Protein 6.5 - 8.1 g/dL 7.2 - 7.3  Total Bilirubin 0.3 - 1.2 mg/dL 0.6 - 0.6  Alkaline Phos 38 - 126 U/L 88 - 68  AST 15 - 41 U/L  24 - 18  ALT 0 - 44 U/L 26 - 16   Lab Results  Component Value Date   CA2729 57.1 (H) 08/28/2020    DIAGNOSTIC IMAGING:  I have independently reviewed the scans and discussed with the patient. MR THORACIC SPINE WO CONTRAST  Result Date: 08/15/2020 CLINICAL DATA:  Breast cancer staging. Multiple osseous metastases on recent PET-CT, with involvement of the thoracic spine. EXAM: MRI THORACIC SPINE WITHOUT CONTRAST TECHNIQUE: Multiplanar, multisequence MR imaging of the thoracic spine was performed. No intravenous contrast was administered. Patient refused contrast. COMPARISON:  PET-CT 08/07/2020. FINDINGS: Alignment:  Physiologic. Vertebrae: As seen on recent PET-CT, there are multiple osseous metastases within the thoracic spine and posterior ribs bilaterally. There is near complete replacement of the T1 vertebral body by tumor which extends into the posterior elements. There is associated pathologic fracture and mild osseous retropulsion, but no cord deformity. There are metastases within the posterior elements at T3 and T4, best seen on the inversion recovery images. There is a small lesion in the superior endplate of T7 with a mild pathologic fracture. Cord:  The  thoracic cord appears normal in signal and caliber. Paraspinal and other soft tissues: Possible paraspinal soft tissue tumor on the left at T1, further described below. No other significant paraspinal findings. Disc levels: T7-T1: T1 tumor extension into the left pedicle contributes to left foraminal narrowing and possible left C8 nerve root encroachment. No cord deformity. T1-2: Mild left foraminal narrowing due to tumor in the left T1 pedicle. No cord deformity. No other evidence of significant epidural or paraspinal tumor. Mild disc bulging throughout the thoracic spine with wide patency of the spinal canal. IMPRESSION: 1. As seen on recent PET-CT, there are multiple osseous metastases within the thoracic spine and posterior ribs  bilaterally. 2. Associated pathologic fracture of T1 with mild osseous retropulsion and possible left C8 nerve root encroachment within the foramen at C7-T1. No cord deformity. 3. Mild pathologic fracture of the superior endplate of T7. 4. No significant epidural or paraspinal tumor at the other levels. Electronically Signed   By: Richardean Sale M.D.   On: 08/15/2020 15:38   MR LUMBAR SPINE WO CONTRAST  Result Date: 08/15/2020 CLINICAL DATA:  Metastatic breast cancer with known osseous metastases on PET-CT. EXAM: MRI LUMBAR SPINE WITHOUT CONTRAST TECHNIQUE: Multiplanar, multisequence MR imaging of the lumbar spine was performed. No intravenous contrast was administered. Patient refused contrast. COMPARISON:  PET-CT 08/07/2020.  Abdominopelvic CT 11/19/2016. FINDINGS: Segmentation: Conventional anatomy assumed, with the last open disc space designated L5-S1.Concordant with previous imaging. Alignment: Convex left scoliosis centered at L2-3. Degenerative grade 1 anterolisthesis at L5-S1. Vertebrae: As seen on recent PET-CT, there are multiple osseous metastases within the lumbar spine and upper sacrum. Lesions are noted within the left L1 pedicle, the left aspect of the L3 vertebral body, the central aspect of the L4 vertebral body, the right aspect of the L5 vertebral body and the upper sacrum. No evidence of pathologic fracture or epidural tumor. Lesions in the iliac bones are partially imaged. The sacroiliac joints appear unremarkable. Conus medullaris: Extends to the L1-2 level and appears normal. Paraspinal and other soft tissues: No significant paraspinal findings. Disc levels: T12-L1: Loss of disc height with annular disc bulging and endplate osteophytes. No significant spinal stenosis or nerve root encroachment. L1-2: Loss of disc height with annular disc bulging and endplate osteophytes asymmetric to the right. Asymmetric right-sided facet hypertrophy. Resulting mild to moderate right lateral recess and  right foraminal narrowing. L2-3: Loss of disc height with annular disc bulging and endplate osteophytes asymmetric to the right. Mild bilateral facet hypertrophy. Mild right lateral recess and right foraminal narrowing. L3-4: Chronic degenerative disc disease with loss of disc height, annular disc bulging and endplate osteophytes. Mild bilateral facet hypertrophy. Mild narrowing of the lateral recesses and foramina bilaterally. L4-5: Previous posterior decompression with wide patency of the spinal canal and lateral recesses. Chronic degenerative disc disease with loss of disc height and endplate osteophytes. Mild foraminal narrowing bilaterally. L5-S1: Relatively preserved disc height with mild disc bulging and moderate bilateral facet hypertrophy. Mild foraminal narrowing bilaterally. IMPRESSION: 1. Multilevel osseous metastases within the lumbar spine and upper pelvis as seen on recent PET-CT. No evidence of pathologic fracture or epidural tumor. 2. Multilevel spondylosis with disc bulging, endplate osteophytes and facet hypertrophy as described. No high-grade spinal stenosis. Mild to moderate lateral recess and foraminal narrowing as detailed above. 3. Previous posterior decompression at L4-5. Electronically Signed   By: Richardean Sale M.D.   On: 08/15/2020 15:56   CT Biopsy  Result Date: 08/22/2020 INDICATION: 86 year old female referred for  biopsy of soft tissue lesion involving the posterior right ribs EXAM: CT BIOPSY MEDICATIONS: None. ANESTHESIA/SEDATION: Moderate (conscious) sedation was employed during this procedure. A total of Versed 1.0 mg and Fentanyl 50 mcg was administered intravenously. Moderate Sedation Time: 16 minutes. The patient's level of consciousness and vital signs were monitored continuously by radiology nursing throughout the procedure under my direct supervision. FLUOROSCOPY TIME:  CT COMPLICATIONS: None PROCEDURE: Informed written consent was obtained from the patient after a  thorough discussion of the procedural risks, benefits and alternatives. All questions were addressed. Maximal Sterile Barrier Technique was utilized including caps, mask, sterile gowns, sterile gloves, sterile drape, hand hygiene and skin antiseptic. A timeout was performed prior to the initiation of the procedure. Patient was position prone position on the CT gantry table. Scout CT was acquired for planning purposes. Patient is prepped and draped in the usual sterile fashion. 1% lidocaine was used for local anesthesia. Using CT guidance 17 gauge guide needle was advanced in a parallel manner into the soft tissue lesion of the posterior right fourth rib. Once we confirmed needle tip position, multiple 18 gauge core biopsy were acquired. Only scant amount of material was acquired despite multiple core biopsy. Needle was removed and a final CT image was performed. Sample was sent to the lab in formalin. Patient tolerated the procedure well and remained hemodynamically stable throughout. No complications were encountered and no significant blood loss. IMPRESSION: Status post CT-guided biopsy of posterior right fourth rib soft tissue lesion. Signed, Dulcy Fanny. Dellia Nims, RPVI [facility] Electronically Signed   By: Corrie Mckusick D.O.   On: 08/22/2020 13:04     ASSESSMENT:  1. T2N0 left breast infiltrating lobular carcinoma: -Status post lumpectomy in June 2014, 1.4 cm, grade 1, lymphovascular invasion positive, ER 100%, PR 26%, Ki-67 18%. -She underwent XRT.She did not take adjuvant endocrine therapy. -Mammogram on 07/13/2020, BI-RADS Category 1. -CEA was 10.3, Ca1 2515.8 and CA 19-9 05. -PET scan on 08/07/2020 shows widespread hypermetabolic bone meta stasis with soft tissue components at T1 and sacrum.  Thoracic nodal metastasis. -MRI of the thoracic spine on 08/15/2020 shows pathological fracture of T1 with mild osseous retropulsion and possible left  C8 nerve root encroachment within the foramen at C7-T1.  No cord deformity. -Right third rib biopsy on 08/22/2020 consistent with metastatic breast cancer, ER 100% positive, PR 40% positive, Ki-67 10%, HER-2 2+ and negative by FISH. -Ibrance 100 mg 3 weeks on 1 week off on anastrozole 1 mg daily started on 08/31/2020.  2. Social/family history: -She quit smoking 20 years ago, smoked 2 packs/day for more than 20 years prior to quitting. -She lives at home by herself and is independent of all activities. -Daughter who has multiple myeloma at age 1.   PLAN:  1.Metastatic cancer to the bones, highly likely breast cancer: -She has tolerated Ibrance very well.  She will continue 100 mg 3 weeks on 1 week off. -She is also tolerating anastrozole without any major vasomotor symptoms. -Reviewed labs which showed white count is 4.1 with mild decrease in hemoglobin of 11.5.  ANC is 1.8.  LFTs are normal. -Recommend checking her counts in 2 weeks prior to start of cycle 2.  We will plan to repeat scans in 3 months along with tumor markers. -We will refer to dermatology for right ear lesion which is erythematous and painful.    2. CAD and RCA stenting: -Continue metoprolol and Plavix.  3.  Bilateral rib  pains: -She is taking Norco 5/325 every 4 hours as needed.  I have told her to cut back to every 6 hours and then slowly wean off. -Continue gabapentin 100 mg twice daily.  She cannot take 3 times a day because of drowsiness.  4. Bone metastasis: -She is seeing her dentist soon. -We talked about denosumab initiation after dental work is completed.   Orders placed this encounter:  No orders of the defined types were placed in this encounter.    Derek Jack, MD [place] [phone]   I, Milinda Antis, am acting as a scribe for Dr. Sanda Linger.  I, Derek Jack MD, have reviewed the above documentation for accuracy and completeness, and I agree  with the above.

## 2020-09-13 NOTE — Patient Instructions (Addendum)
[facility] at [facility] Discharge Instructions  You were seen today by Dr. Delton Coombes. He went over your recent results. Make sure to take Ibrance for 3 weeks on and take 1 week off; take the anastrozole daily. Take the Norco every 6 hours and only if you have severe pain; purchase Colace over the counter and take daily to help with your constipation. You will be referred to Dr. Nevada Crane for your ear lesion. Dr. Delton Coombes will see you back in 2 weeks for labs and follow up.   Thank you for choosing [facility] at [facility] to provide your oncology and hematology care.  To afford each patient quality time with our provider, please arrive at least 15 minutes before your scheduled appointment time.   If you have a lab appointment with the [facility] please come in thru the Main Entrance and check in at the main information desk  You need to re-schedule your appointment should you arrive 10 or more minutes late.  We strive to give you quality time with our providers, and arriving late affects you and other patients whose appointments are after yours.  Also, if you no show three or more times for appointments you may be dismissed from the clinic at the providers discretion.     Again, thank you for choosing [facility].  Our hope is that these requests will decrease the amount of time that you wait before being seen by our physicians.       _____________________________________________________________  Should you have questions after your visit to [facility], please contact our office at [phone] between the hours of 8:00 a.m. and 4:30 p.m.  Voicemails left after 4:00 p.m. will not be returned until the following business day.  For prescription refill requests, have your pharmacy contact our office and allow 72 hours.    Cancer Center Support Programs:   > Cancer Support Group  2nd Tuesday of the month 1pm-2pm, Journey  Room

## 2020-09-18 DIAGNOSIS — H61001 Unspecified perichondritis of right external ear: Secondary | ICD-10-CM | POA: Diagnosis not present

## 2020-09-19 ENCOUNTER — Encounter: Payer: Self-pay | Admitting: Cardiovascular Disease

## 2020-09-19 ENCOUNTER — Other Ambulatory Visit: Payer: Self-pay

## 2020-09-19 ENCOUNTER — Ambulatory Visit (INDEPENDENT_AMBULATORY_CARE_PROVIDER_SITE_OTHER): Payer: Medicare Other | Admitting: Cardiovascular Disease

## 2020-09-19 VITALS — BP 124/84 | HR 64 | Ht 66.0 in | Wt 161.2 lb

## 2020-09-19 DIAGNOSIS — C50919 Malignant neoplasm of unspecified site of unspecified female breast: Secondary | ICD-10-CM | POA: Diagnosis not present

## 2020-09-19 DIAGNOSIS — E785 Hyperlipidemia, unspecified: Secondary | ICD-10-CM | POA: Diagnosis not present

## 2020-09-19 DIAGNOSIS — G459 Transient cerebral ischemic attack, unspecified: Secondary | ICD-10-CM

## 2020-09-19 DIAGNOSIS — I2510 Atherosclerotic heart disease of native coronary artery without angina pectoris: Secondary | ICD-10-CM

## 2020-09-19 DIAGNOSIS — Z9861 Coronary angioplasty status: Secondary | ICD-10-CM | POA: Diagnosis not present

## 2020-09-19 DIAGNOSIS — I472 Ventricular tachycardia: Secondary | ICD-10-CM | POA: Diagnosis not present

## 2020-09-19 DIAGNOSIS — I4729 Other ventricular tachycardia: Secondary | ICD-10-CM

## 2020-09-19 DIAGNOSIS — I10 Essential (primary) hypertension: Secondary | ICD-10-CM

## 2020-09-19 DIAGNOSIS — I200 Unstable angina: Secondary | ICD-10-CM

## 2020-09-19 NOTE — Progress Notes (Signed)
Cardiology Office Note    Date:  09/20/2020   ID:  Sophiamarie, Nease Jun 07, 1933, MRN [id number]  PCP:  Celene Squibb, MD  Referring M.D.: Dr. Velvet Bathe Cardiologist:  Shelva Majestic, MD    History of Present Illness:  Kara Woods is a 86 y.o. female who presents for a 2 month follow-up cardiology evaluation   Ms. Brent is a mother of my patient, who had suffered an out of hospital cardiac arrest years ago.  Ms. Sobol has a history of prior cardiac catheterization and from records [name] had undergone cardiac catheterization in 1998.  The patient states her coronaries were clean.  I do not have specifics of her catheterization findings.  Over the past 6 months, she has developed progressive decline in ability to be active and has noticed significant increasing exertionally precipitated shortness of breath.  She also has had some episodes of associated chest pressure with left arm radiation.  She had undergone an echo Doppler study on 10/24/2016 which showed an EF of 50% and suggested diffuse hypokinesis.  There was grade 1 diastolic dysfunction.  There was mild MR and mild LA dilatation.  She also had undergone pulmonary function testing was not significantly abnormal.  Her past history is notable for left breast CA and she is status post prior lumpectomy and radiation treatment.  She was cared for by Dr. Excell Seltzer.  She also is status post hysterectomy a remote history of cholecystectomy.  She has a history of retinal detachment.  When I saw her, due to progressive symptomatology with decline in exercise tolerance and increasing exertional dyspnea with a very strong family history of CAD I recommended definitive cardiac catheterization.  This was performed by me on 11/04/2016 and revealed low normal global LV function with an EF of 50-55% with mild mid inferior focal hypocontractility.  There was multivessel CAD with multiple stenoses of 40-50% mid to distal LAD.  She had a normal  ramus intermediate vessel.  There was 80% focal stenosis in the left circumflex and 95% eccentric mid RCA stenosis followed by 50% narrowing.  She underwent initial PCI to the mid RCA with insertion of a 2.2512 mm Resolute Onyx stent postdilated to 2.5 mm with the 95% stenoses being reduced to 0%.  The following day, on 11/05/2016 she underwent staged intervention to the left circumflex flex artery with the 50 and 85% stenoses being reduced to 0% with insertion of a 2.7523 mm  Xience Alpine DES stent postdilated to 3.0.  Since her intervention, she has felt significantly improved.  She had mild dyspnea more attributed to air hunger and this typically is relieved when she takes a deep sigh.   She was started on Lipitor but did not tolerate this.  Subsequently, I recommended initiation of Crestor at 5 mg 2 times a week.  She took 2 doses and then felt she had similar symptoms and stopped taking this.  She denies any exertional chest tightness.  She was diagnosed with a left knee Baker's cyst.  She denies palpitations.    She underwent a one-year follow-up nuclear stress test on 10/09/2017 which remained low risk and showed an EF of 51% without scar or ischemia.    She was  evaluated at [facility] emergency room when she was brought there by family members with complaints of increasing shortness of breath andvague nonexertional chest tightness.  There was concern of whether or not Brilinta could responsible.  Laboratory was negative.  She was not  found to have evidence for pneumonia or CHF and had a negative d-dimer.  She denies any episodes of chest pain since her evaluation.  She continues to experience exertional dyspnea.  He has been on aspirin and Brilinta for antiplatelet therapy, Zetia 10 mg for hyperlipidemia although when previously I suggested a trial of Livalo which she never pursued.    An echo Doppler study on Apr 03, 2018  showed an EF of 50% with grade 1 diastolic dysfunction.  There was aortic  sclerosis without stenosis, mild MR, and probable lipomatous hypertrophy of her atrial septum.  Although I again at that time had recommended institution of lipid-lowering therapy added to her Zetia with Crestor 5 mg weekly she never pursued this.  Her LV diastolic dysfunction and some shortness of breath I added spironolactone 12.5 mg.  The past several months she has felt improved.  She denies chest pain PND orthopnea.  She never started the rosuvastatin but was uncertain why. She denies bleeding.   I saw her in August 2019; she was doing  well and has been without recurrent chest pain she denies palpitations.  Apparently for some reason she had discontinued Zetia but had tolerated this well.  She has not been able to tolerate any statins.  We had even tried to initiate Livalo but she never started this.  She believes she is sleeping well but she wakes up at least 3 times per night to night.  She was unaware of snoring or gasping for breath.  During that evaluation I recommended that she resume Zetia 10 mg daily.  She will stop follow-up laboratory with Dr. Nevada Crane.  I saw her in February 2021 at that time she was only intermittently taking Zetia, perhaps 1 time per week. Laboratory in July 2020 showed an LDL cholesterol of 130.  She just recently underwent laboratory by Dr. Nevada Crane in January 2021.  At that time, total cholesterol was 198, triglycerides 164 and HDL 44 but LDL was unknown.  I last saw her in August 2021 at which time she reported falling 1 month previously and had discomfort all over. She had anterior chest wall discomfort which was nonexertional which was felt most likely to be of musculoskeletal etiology. She was at that time back on Zetia 10 mg daily we will continue to be on DAPT with aspirin/Plavix and was on metoprolol tartrate 12.5 mg twice a day.  She presented to the emergency room with garbled speech on July 24, 2020 was felt to have had a possible TIA. An MRI revealed small chronic  cerebellar infarcts..  Subsequently, she has been diagnosed with stage IV metastatic breast cancer. Remotely she had undergone left lumpectomy in June 2014 and received radiation therapy. On recent reevaluation by Dr. Delton Coombes in September 2021,  PET imaging revealed  widespread skeletal metastases as well has lesions with soft tissue components at T1 and the sacrum. There also was mediastinal adenopathy. She has continued to experience rib pain bilaterally. She has been started on anastrozole and Ibrance therapy.  She had recently worn a cardiac monitor which showed predominant sinus rhythm with an average rate at 75 bpm, maximum rate with sinus tachycardia at 145 bpm and slowest rate at sinus bradycardia 58 bpm. She had one episode of nonsustained V. tach lasting 7 beats at a rate of 133 bpm. She denies recurrent palpitations. She presents for evaluation  Past Medical History:  Diagnosis Date  . Anxiety   . Arthritis   . Blind left eye   .  Breast cancer ([place])    left   . Colitis   . Coronary artery disease    a. 10/2016 Staged PCI: RCA 50p, 53m(2.25x12 Resolute Onyx DES), LCX 50p, 841m2.75x23 Xience Alpine DES). Residual LAD 50p/m, 4548m  . CMarland Kitchenstocele 10/11/2013  . Depression   . Diastolic dysfunction    a. 09/2016 Echo: EF 50%, diffuse hypokinesis, mild LVH, grade 1 diastolic dysfunction, mild mitral regurgitation, mildly dilated left atrium.  . GMarland KitchenRD (gastroesophageal reflux disease)    occasional Tums only  . HOH (hard of hearing)   . Pelvic relaxation 10/11/2013  . Proctitis    colonsocopy 2007, Canasa suppositories  . S/P endoscopy March 2009   mild erosive reflux esophagitis, Schatzki's ring, s/p dilation  . Schatzki's ring   . Shortness of breath    occ if anxiety  . Wears dentures    upper  . Wears glasses     Past Surgical History:  Procedure Laterality Date  . ABDOMINAL HYSTERECTOMY    . ANTERIOR AND POSTERIOR REPAIR N/A 05/17/2014   Procedure: ANTERIOR  (CYSTOCELE) AND POSTERIOR REPAIR (RECTOCELE);  Surgeon: ScoReece PackerD;  [facility];  Service: Urology;  Laterality: N/A;  . APPENDECTOMY    . BREAST LUMPECTOMY WITH NEEDLE LOCALIZATION AND AXILLARY SENTINEL LYMPH NODE BX Left 05/03/2013   Procedure: BREAST LUMPECTOMY WITH NEEDLE LOCALIZATION AND AXILLARY SENTINEL LYMPH NODE BX;  Surgeon: BenEdward JollyD;  [facility];  Laterality: Left;  . BREAST SURGERY Left 05/19/13  . CARDIAC CATHETERIZATION  1998  . CARDIAC CATHETERIZATION N/A 11/04/2016   Procedure: Left Heart Cath and Coronary Angiography;  Surgeon: ThoTroy SineD;  Location[facility];  Service: Cardiovascular;  Laterality: N/A;  . CARDIAC CATHETERIZATION N/A 11/05/2016   Procedure: Coronary Stent Intervention;  Surgeon: ThoTroy SineD;  Location: [facility];  Service: Cardiovascular;  Laterality: N/A;  . CHOLECYSTECTOMY    . COLONOSCOPY  05/06/2006   Diffuse inflammatory changes of the rectal mucosa, consistent  with proctitis.  Otherwise, normal colon to terminal ileum  . CORONARY ANGIOPLASTY  11/04/2016  . CORONARY STENT PLACEMENT     Drug-eluting coronary artery stent, non-bioabsorbable-polymer-coated  . CYSTOSCOPY N/A 05/17/2014   Procedure: CYSTOSCOPY;  Surgeon: ScoReece PackerD;  [facility];  Service: Urology;  Laterality: N/A;  . ESOPHAGOGASTRODUODENOSCOPY  02/09/2008   Schatzki ring status post dilation/Distal esophageal erosion consistent with mild erosive reflux  esophagitis, otherwise unremarkable esophagus, normal stomach, D1, D2.  . EYE SURGERY     lt cataract-implant  . EYE SURGERY     lt-lens repaced,l  . LUMKeeneRGERY  1993  . RE-EXCISION OF BREAST LUMPECTOMY Left 05/19/2013   Procedure: RE-EXCISION OF BREAST LUMPECTOMY;  Surgeon: BenEdward JollyD;  [facility];  Laterality: Left;  . RETINAL DETACHMENT SURGERY   1978   Left  . VAGINAL HYSTERECTOMY Bilateral 05/17/2014   Procedure: HYSTERECTOMY VAGINAL with Bilateral Salpingo-Oophorectomy; Bladder cystotomy repair;  Surgeon: SheMarvene StaffD;  [facility];  Service: Gynecology;  Laterality: Bilateral;  . VAGINAL PROLAPSE REPAIR N/A 05/17/2014   Procedure: VAGINAL VAULT PROLAPSE AND GRAFT;  Surgeon: ScoReece PackerD;  [facility];  Service: Urology;  Laterality: N/A;    Current Medications: Outpatient Medications Prior to Visit  Medication Sig Dispense Refill  . anastrozole (ARIMIDEX) 1 MG tablet Take 1 tablet (1 mg total) by  mouth daily. 30 tablet 6  . aspirin EC 81 MG tablet Take 1 tablet (81 mg total) by mouth daily with breakfast. 90 tablet 3  . carboxymethylcellulose (REFRESH PLUS) 0.5 % SOLN Place 3-4 drops into both eyes 3 (three) times daily as needed (dry eyes). Preservative free    . clopidogrel (PLAVIX) 75 MG tablet Take 75 mg by mouth daily.     . clorazepate (TRANXENE) 15 MG tablet Take 7.5 mg by mouth daily. May take an additional 7.5 mg if needed    . ezetimibe (ZETIA) 10 MG tablet Take 10 mg by mouth daily.    Marland Kitchen gabapentin (NEURONTIN) 100 MG capsule Take 1 capsule (100 mg total) by mouth 3 (three) times daily. 90 capsule 2  . HYDROcodone-acetaminophen (NORCO) 5-325 MG tablet Take 1 tablet by mouth every 4 (four) hours as needed for moderate pain. 180 tablet 0  . latanoprost (XALATAN) 0.005 % ophthalmic solution Place 1 drop into both eyes at bedtime.    . metoprolol tartrate (LOPRESSOR) 25 MG tablet TAKE 1/2 TABLET BY MOUTH TWICE A DAY (Patient taking differently: Take 12.5 mg by mouth 2 (two) times daily. ) 90 tablet 3  . nitroGLYCERIN (NITROSTAT) 0.4 MG SL tablet Place 1 tablet (0.4 mg total) under the tongue every 5 (five) minutes as needed. 25 tablet 3  . palbociclib (IBRANCE) 100 MG tablet Take 1 tablet (100 mg total) by mouth daily. Take for 21 days on, 7 days off, repeat every 28 days. 21 tablet 1  .  nitrofurantoin, macrocrystal-monohydrate, (MACROBID) 100 MG capsule Take 100 mg by mouth 2 (two) times daily. (Patient not taking: Reported on 09/19/2020)    . acetaminophen (TYLENOL) 500 MG tablet Take 500 mg by mouth every 6 (six) hours as needed for mild pain.      No facility-administered medications prior to visit.     Allergies:   Contrast media [iodinated diagnostic agents], Lipitor [atorvastatin], Sulfamethoxazole, and Sulfonamide derivatives   Social History   Socioeconomic History  . Marital status: Widowed    Spouse name: Not on file  . Number of children: 6  . Years of education: Not on file  . Highest education level: Not on file  Occupational History  . Occupation: Retired  Tobacco Use  . Smoking status: Former Smoker    Packs/day: 1.00    Types: Cigarettes    Quit date: 04/28/1990    Years since quitting: 30.4  . Smokeless tobacco: Never Used  . Tobacco comment: quit 28 yrs ago  Substance and Sexual Activity  . Alcohol use: Yes    Alcohol/week: 1.0 standard drink    Types: 1 Glasses of wine per week    Comment: occ  . Drug use: No  . Sexual activity: Not Currently    Birth control/protection: Post-menopausal  Other Topics Concern  . Not on file  Social History Narrative  . Not on file   Social Determinants of Health   Financial Resource Strain:   . Difficulty of Paying Living Expenses: Not on file  Food Insecurity:   . Worried About Charity fundraiser in the Last Year: Not on file  . Ran Out of Food in the Last Year: Not on file  Transportation Needs:   . Lack of Transportation (Medical): Not on file  . Lack of Transportation (Non-Medical): Not on file  Physical Activity:   . Days of Exercise per Week: Not on file  . Minutes of Exercise per Session: Not on file  Stress:   .  Feeling of Stress : Not on file  Social Connections:   . Frequency of Communication with Friends and Family: Not on file  . Frequency of Social Gatherings with Friends and  Family: Not on file  . Attends Religious Services: Not on file  . Active Member of Clubs or Organizations: Not on file  . Attends Archivist Meetings: Not on file  . Marital Status: Not on file     Family History:  The patient's family history includes Anemia in her daughter; Cancer in her brother; Depression in her son; Heart attack in her father, mother, son, and son; Multiple myeloma in her daughter.  I mother died and had a myocardial infarction.  Her father also had suffered a myocardial infarction.  She had 3 sisters who also had myocardial infarction and also one of her brothers also had suffered a myocardial infarction.  ROS General: Negative; No fevers, chills, or night sweats;  HEENT: History of retinal detachment, sinus congestion, difficulty swallowing Pulmonary: Negative; No cough, wheezing, shortness of breath, hemoptysis Cardiovascular: see HPI GI: Positive for occasional loose stools GU: Negative; No dysuria, hematuria, or difficulty voiding Musculoskeletal: Positive for left Baker's cyst, chest wall discomfort with bone mets Hematologic/Oncology: Stage IV metastatic breast CA Endocrine: Negative; no heat/cold intolerance; no diabetes Neuro: TIA Skin: Negative; No rashes or skin lesions Psychiatric: Negative; No behavioral problems, depression Sleep: Negative; No snoring, daytime sleepiness, hypersomnolence, bruxism, restless legs, hypnogognic hallucinations, no cataplexy Other comprehensive 14 point system review is negative.   PHYSICAL EXAM:   VS:  BP 124/84   Pulse 64   Ht 5' 6"  (1.676 m)   Wt 161 lb 3.2 oz (73.1 kg)   SpO2 94%   BMI 26.02 kg/m     Repeat blood pressure by me 122/78  Wt Readings from Last 3 Encounters:  09/19/20 161 lb 3.2 oz (73.1 kg)  09/13/20 162 lb 8 oz (73.7 kg)  08/28/20 160 lb 9.6 oz (72.8 kg)    General: Alert, oriented, no distress.  Skin: normal turgor, no rashes, warm and dry HEENT: Normocephalic, atraumatic. Pupils  equal round and reactive to light; sclera anicteric; extraocular muscles intact;  Nose without nasal septal hypertrophy Mouth/Parynx benign; Mallinpatti scale 2 Neck: No JVD, no carotid bruits; normal carotid upstroke Lungs: clear to ausculatation and percussion; no wheezing or rales Chest wall: Chest wall 6 tenderness 6 most likely secondary to her rib metastases Heart: PMI not displaced, RRR, s1 s2 normal, 1/6 systolic murmur, no diastolic murmur, no rubs, gallops, thrills, or heaves Abdomen: soft, nontender; no hepatosplenomehaly, BS+; abdominal aorta nontender and not dilated by palpation. Back: no CVA tenderness Pulses 2+ Musculoskeletal: full range of motion, normal strength, no joint deformities Extremities: no clubbing cyanosis or edema, Homan's sign negative  Neurologic: grossly nonfocal; Cranial nerves grossly wnl Psychologic: Normal mood and affect    Studies/Labs Reviewed:    ECG (independently read by me): Normal sinus rhythm at 64 bpm.  No ectopy.  Normal intervals  July 04, 2020 ECG (independently read by me): Normal sinus rhythm at 68 bpm.  No ectopy.  QTc interval 469 ms.  No ST segment changes  February 2020 ECG (independently read by me): Minus rhythm at 73 bpm with sinus arrhythmia.  QTc interval 458 ms.  PR interval 152 ms.  No significant ST-T change.  August 2019 ECG (independently read by me): Sinus rhythm at 73 with sinus arrhythmia.  Normal intervals.  May 2019 ECG (independently read by me): Normal sinus  rhythm at 66 bpm.  No ectopy.  No ST segment changes.  December 2018 ECG (independently read by me): Normal sinus rhythm at 77 bpm.  Nonspecific ST changes.  June 2018 ECG (independently read by me): Sinus bradycardia 56 bpm.  Normal intervals.  No significant ST-T changes.  March 2018 ECG (independently read by me): Sinus bradycardia 54 bpm.  Normal intervals.  No ST segment changes.  December 2017 ECG (independently read by me): Normal sinus rhythm  at 74 bpm.  No ectopy.  QTc interval 455 ms.  Recent Labs: BMP Latest Ref Rng & Units 09/13/2020 08/28/2020 07/24/2020  Glucose 70 - 99 mg/dL 91 105(H) 90  BUN 8 - 23 mg/dL 13 16 19   Creatinine 0.44 - 1.00 mg/dL 0.93 0.68 0.70  Sodium 135 - 145 mmol/L 139 136 141  Potassium 3.5 - 5.1 mmol/L 4.2 4.0 4.2  Chloride 98 - 111 mmol/L 100 100 102  CO2 22 - 32 mmol/L 28 26 -  Calcium 8.9 - 10.3 mg/dL 9.0 9.3 -     Hepatic Function Latest Ref Rng & Units 09/13/2020 08/28/2020 07/24/2020  Total Protein 6.5 - 8.1 g/dL 7.2 7.2 7.3  Albumin 3.5 - 5.0 g/dL 3.7 4.0 4.1  AST 15 - 41 U/L 15 24 18   ALT 0 - 44 U/L 14 26 16   Alk Phosphatase 38 - 126 U/L 64 88 68  Total Bilirubin 0.3 - 1.2 mg/dL 0.6 0.6 0.6  Bilirubin, Direct 0.1 - 0.5 mg/dL - - -    CBC Latest Ref Rng & Units 09/13/2020 08/28/2020 08/22/2020  WBC 4.0 - 10.5 K/uL 4.1 8.6 7.5  Hemoglobin 12.0 - 15.0 g/dL 11.5(L) 13.1 13.1  Hematocrit 36 - 46 % 35.2(L) 41.5 40.8  Platelets 150 - 400 K/uL 206 270 293   Lab Results  Component Value Date   MCV 93.6 09/13/2020   MCV 92.4 08/28/2020   MCV 94.7 08/22/2020   No results found for: TSH No results found for: HGBA1C   BNP    Component Value Date/Time   BNP 51.0 09/01/2016 1012    ProBNP No results found for: PROBNP   Lipid Panel     Component Value Date/Time   CHOL 227 (H) 10/31/2016 1245   TRIG 164 (H) 10/31/2016 1245   HDL 47 (L) 10/31/2016 1245   CHOLHDL 4.8 10/31/2016 1245   VLDL 33 (H) 10/31/2016 1245   LDLCALC 147 (H) 10/31/2016 1245     RADIOLOGY: CARDIAC EVENT MONITOR  Result Date: 09/20/2020 The patient was monitored from August 02, 2020 until August 15, 2020. The predominant rhythm was sinus rhythm with an average rate of 75 bpm. The minimum rhythm was sinus bradycardia at 58 bpm and the maximum was sinus tachycardia at 145 bpm which occurred at 7:09 PM on September 13. There were no episodes of atrial fibrillation. There was one episode of a 7 beat run of  nonsustained ventricular tachycardia at an average rate at 133 bpm on August 05, 2020 at 1:15 PM. There were no significant pauses.  CT Biopsy  Result Date: 08/22/2020 INDICATION: 86 year old female referred for biopsy of soft tissue lesion involving the posterior right ribs EXAM: CT BIOPSY MEDICATIONS: None. ANESTHESIA/SEDATION: Moderate (conscious) sedation was employed during this procedure. A total of Versed 1.0 mg and Fentanyl 50 mcg was administered intravenously. Moderate Sedation Time: 16 minutes. The patient's level of consciousness and vital signs were monitored continuously by radiology nursing throughout the procedure under my direct supervision. FLUOROSCOPY TIME:  CT COMPLICATIONS:  None PROCEDURE: Informed written consent was obtained from the patient after a thorough discussion of the procedural risks, benefits and alternatives. All questions were addressed. Maximal Sterile Barrier Technique was utilized including caps, mask, sterile gowns, sterile gloves, sterile drape, hand hygiene and skin antiseptic. A timeout was performed prior to the initiation of the procedure. Patient was position prone position on the CT gantry table. Scout CT was acquired for planning purposes. Patient is prepped and draped in the usual sterile fashion. 1% lidocaine was used for local anesthesia. Using CT guidance 17 gauge guide needle was advanced in a parallel manner into the soft tissue lesion of the posterior right fourth rib. Once we confirmed needle tip position, multiple 18 gauge core biopsy were acquired. Only scant amount of material was acquired despite multiple core biopsy. Needle was removed and a final CT image was performed. Sample was sent to the lab in formalin. Patient tolerated the procedure well and remained hemodynamically stable throughout. No complications were encountered and no significant blood loss. IMPRESSION: Status post CT-guided biopsy of posterior right fourth rib soft tissue lesion.  Signed, Dulcy Fanny. Dellia Nims, RPVI [facility] Radiology Electronically Signed   By: Corrie Mckusick D.O.   On: 08/22/2020 13:04     Additional studies/ records that were reviewed today include:   At her last office visit I reviewed the office records of [name].  I reviewed her echo Doppler study and pulmonary function tests. I reviewed a  recent head CT  ASSESSMENT:    1. CAD S/P percutaneous coronary angioplasty   2. Essential hypertension   3. Dyslipidemia, goal LDL below 70   4. NSVT (nonsustained ventricular tachycardia) ([place])   5. Metastatic breast cancer ([place])   6. TIA (transient ischemic attack)     PLAN:  Ms. Lubertha Leite is an 86 year old Caucasian female who had developed progressive decline in exercise tolerance with significant increasing shortness of breath with minimal activity associated with chest pressure and left arm radiation.  In 1998, reportedly a cardiac catheterization had shown normal coronary arteries.  An echo Doppler study from November 2017 showed an EF of 50% with diffuse hypokinesis.  There was mild MR and mild dilatation of her left atrium. Her pulmonary function studies did not identified significant pulmonary disability in the etiology of her dyspnea.  With her very strong family history of myocardial infarctions in multiple family members, as well as remote tobacco history and progressive changes in symptomatology she underwent repeat cardiac catheterization on November 04, 2016.  This revealed severe multivessel CAD as noted above and she underwent successful stenting of her RCA as well as her left circumflex vessels in a staged procedure.  When I saw her, she had noticed some subsequent shortness of breath which improved with initiation of Spironolactone.  She currently is no longer taking this apparently is just on metoprolol 12.5 mg.  Her Brilinta was substituted with Plavix and P2Y12 testing indicated  Plavix responsiveness. Her blood pressure today is stable on metoprolol 12.5 mg twice a day. She has a history of statin intolerance and is now back on Zetia 10 mg daily. She has not had any anginal symptoms. Apparently she developed TIA symptoms leading to ER evaluation in August 2021 and CT demonstrated chronic cerebellar infarct. She was referred for a cardiac monitor which showed predominant sinus rhythm. There was no evidence for atrial fibrillation; however she did have 170 run of nonsustained ventricular tachycardia at a rate of 133  bpm. She has a remote history of breast CA dating back to 2014 and unfortunately now has demonstrated stage IV metastatic disease with metastases to her skeletal bones, ribs, as well as spine. She has been started on Ibrance and anastrozole by Dr. Delton Coombes with plans to repeat PET imaging in 3 months. She is not having any anginal symptoms. Her prior chest wall discomfort most likely is due to her metastatic disease. I will see her in 6 months for reevaluation.     I last saw her, she was no longer taking Zetia.  I recommended reinstitution but unfortunately her compliance has been poor and she is only been taking this perhaps 1 time per week.  Her LDL cholesterol in July was elevated at 130.  Her lab was recently checked.  We will try to obtain these results from Dr. Nevada Crane.  She apparently has been intolerant to statins.  I strongly recommended that she resume taking Zetia 10 mg on a daily basis.  She may be a candidate for bempedoic acid combination with Zetia and I will defer institution potentially until obtain results of most recent laboratory.  She is not having any exertional chest pain or exertional dyspnea.  When she feels like there is a band around her chest typically this does not last long and it usually occurs when she is sitting down.  She does wear bras but not all the time but this may be contributing to some of her discomfort.  Her blood pressure today is  controlled on metoprolol 12.5 mg twice a day.  She continues to be on DAPT with aspirin/Plavix.  She has neuropathy for which she takes gabapentin.  Weight is stable.  Medication Adjustments/Labs and Tests Ordered: Current medicines are reviewed at length with the patient today.  Concerns regarding medicines are outlined above.  Medication changes, Labs and Tests ordered today are listed in the Patient Instructions below.  Patient Instructions  Medication Instructions:  Your physician recommends that you continue on your current medications as directed. Please refer to the Current Medication list given to you today.  *If you need a refill on your cardiac medications before your next appointment, please call your pharmacy*   Follow-Up: At [facility], you and your health needs are our priority.  As part of our continuing mission to provide you with exceptional heart care, we have created designated Provider Care Teams.  These Care Teams include your primary Cardiologist (physician) and Advanced Practice Providers (APPs -  Physician Assistants and Nurse Practitioners) who all work together to provide you with the care you need, when you need it.  We recommend signing up for the patient portal called "MyChart".  Sign up information is provided on this After Visit Summary.  MyChart is used to connect with patients for Virtual Visits (Telemedicine).  Patients are able to view lab/test results, encounter notes, upcoming appointments, etc.  Non-urgent messages can be sent to your provider as well.   To learn more about what you can do with MyChart, go to [email or web address].    Your next appointment:   Jan 04, 2021 as previously scheduled  The format for your next appointment:   In Person  Provider:   You may see Shelva Majestic, MD or one of the following Advanced Practice Providers on your designated Care Team:    Almyra Deforest, PA-C  Fabian Sharp, PA-C or   Roby Lofts, [place]    Other  Instructions      Signed, Shelva Majestic, MD,  [facility] 09/20/2020 9:51 AM    [facility] [address], [place], [place], [place]  [address] Phone: [phone]

## 2020-09-19 NOTE — Patient Instructions (Addendum)
Medication Instructions:  Your physician recommends that you continue on your current medications as directed. Please refer to the Current Medication list given to you today.  *If you need a refill on your cardiac medications before your next appointment, please call your pharmacy*   Follow-Up: At [facility], you and your health needs are our priority.  As part of our continuing mission to provide you with exceptional heart care, we have created designated Provider Care Teams.  These Care Teams include your primary Cardiologist (physician) and Advanced Practice Providers (APPs -  Physician Assistants and Nurse Practitioners) who all work together to provide you with the care you need, when you need it.  We recommend signing up for the patient portal called "MyChart".  Sign up information is provided on this After Visit Summary.  MyChart is used to connect with patients for Virtual Visits (Telemedicine).  Patients are able to view lab/test results, encounter notes, upcoming appointments, etc.  Non-urgent messages can be sent to your provider as well.   To learn more about what you can do with MyChart, go to [email or web address].    Your next appointment:   Jan 04, 2021 as previously scheduled  The format for your next appointment:   In Person  Provider:   You may see Shelva Majestic, MD or one of the following Advanced Practice Providers on your designated Care Team:    Almyra Deforest, PA-C  Fabian Sharp, PA-C or   Roby Lofts, [place]    Other Instructions

## 2020-09-20 ENCOUNTER — Encounter: Payer: Self-pay | Admitting: Cardiovascular Disease

## 2020-09-27 ENCOUNTER — Encounter (HOSPITAL_COMMUNITY): Payer: Self-pay | Admitting: Hematology

## 2020-09-27 ENCOUNTER — Inpatient Hospital Stay (HOSPITAL_COMMUNITY): Payer: Medicare Other | Attending: Hematology | Admitting: Hematology

## 2020-09-27 ENCOUNTER — Other Ambulatory Visit: Payer: Self-pay

## 2020-09-27 ENCOUNTER — Other Ambulatory Visit (HOSPITAL_COMMUNITY): Payer: Self-pay | Admitting: *Deleted

## 2020-09-27 ENCOUNTER — Inpatient Hospital Stay (HOSPITAL_COMMUNITY): Payer: Medicare Other

## 2020-09-27 VITALS — BP 161/78 | HR 65 | Temp 96.8°F | Resp 18 | Wt 157.2 lb

## 2020-09-27 DIAGNOSIS — R0781 Pleurodynia: Secondary | ICD-10-CM | POA: Insufficient documentation

## 2020-09-27 DIAGNOSIS — I2510 Atherosclerotic heart disease of native coronary artery without angina pectoris: Secondary | ICD-10-CM | POA: Insufficient documentation

## 2020-09-27 DIAGNOSIS — C7951 Secondary malignant neoplasm of bone: Secondary | ICD-10-CM | POA: Insufficient documentation

## 2020-09-27 DIAGNOSIS — R8290 Unspecified abnormal findings in urine: Secondary | ICD-10-CM | POA: Diagnosis not present

## 2020-09-27 DIAGNOSIS — Z87891 Personal history of nicotine dependence: Secondary | ICD-10-CM | POA: Diagnosis not present

## 2020-09-27 DIAGNOSIS — H401131 Primary open-angle glaucoma, bilateral, mild stage: Secondary | ICD-10-CM | POA: Diagnosis not present

## 2020-09-27 DIAGNOSIS — Z853 Personal history of malignant neoplasm of breast: Secondary | ICD-10-CM | POA: Insufficient documentation

## 2020-09-27 DIAGNOSIS — F32A Depression, unspecified: Secondary | ICD-10-CM | POA: Insufficient documentation

## 2020-09-27 DIAGNOSIS — Z923 Personal history of irradiation: Secondary | ICD-10-CM | POA: Diagnosis not present

## 2020-09-27 DIAGNOSIS — R300 Dysuria: Secondary | ICD-10-CM | POA: Diagnosis not present

## 2020-09-27 DIAGNOSIS — C50919 Malignant neoplasm of unspecified site of unspecified female breast: Secondary | ICD-10-CM

## 2020-09-27 LAB — URINALYSIS, ROUTINE W REFLEX MICROSCOPIC
Bilirubin Urine: NEGATIVE
Glucose, UA: NEGATIVE mg/dL
Ketones, ur: NEGATIVE mg/dL
Nitrite: NEGATIVE
Protein, ur: NEGATIVE mg/dL
Specific Gravity, Urine: 1.015 (ref 1.005–1.030)
pH: 6 (ref 5.0–8.0)

## 2020-09-27 LAB — CBC WITH DIFFERENTIAL/PLATELET
Band Neutrophils: 1 %
Basophils Absolute: 0 10*3/uL (ref 0.0–0.1)
Basophils Relative: 0 %
Eosinophils Absolute: 0.1 10*3/uL (ref 0.0–0.5)
Eosinophils Relative: 2 %
HCT: 37 % (ref 36.0–46.0)
Hemoglobin: 11.6 g/dL — ABNORMAL LOW (ref 12.0–15.0)
Lymphocytes Relative: 71 %
Lymphs Abs: 2.6 10*3/uL (ref 0.7–4.0)
MCH: 30.4 pg (ref 26.0–34.0)
MCHC: 31.4 g/dL (ref 30.0–36.0)
MCV: 97.1 fL (ref 80.0–100.0)
Monocytes Absolute: 0.1 10*3/uL (ref 0.1–1.0)
Monocytes Relative: 3 %
Neutro Abs: 0.9 10*3/uL — ABNORMAL LOW (ref 1.7–7.7)
Neutrophils Relative %: 23 %
Platelets: 179 10*3/uL (ref 150–400)
RBC: 3.81 MIL/uL — ABNORMAL LOW (ref 3.87–5.11)
RDW: 16.1 % — ABNORMAL HIGH (ref 11.5–15.5)
WBC: 3.6 10*3/uL — ABNORMAL LOW (ref 4.0–10.5)
nRBC: 0 % (ref 0.0–0.2)

## 2020-09-27 LAB — COMPREHENSIVE METABOLIC PANEL
ALT: 11 U/L (ref 0–44)
AST: 15 U/L (ref 15–41)
Albumin: 3.8 g/dL (ref 3.5–5.0)
Alkaline Phosphatase: 57 U/L (ref 38–126)
Anion gap: 8 (ref 5–15)
BUN: 10 mg/dL (ref 8–23)
CO2: 27 mmol/L (ref 22–32)
Calcium: 9.1 mg/dL (ref 8.9–10.3)
Chloride: 103 mmol/L (ref 98–111)
Creatinine, Ser: 0.7 mg/dL (ref 0.44–1.00)
GFR, Estimated: >60 mL/min (ref >60)
Glucose, Bld: 97 mg/dL (ref 70–99)
Potassium: 3.7 mmol/L (ref 3.5–5.1)
Sodium: 138 mmol/L (ref 135–145)
Total Bilirubin: 0.5 mg/dL (ref 0.3–1.2)
Total Protein: 7.1 g/dL (ref 6.5–8.1)

## 2020-09-27 MED ORDER — ESCITALOPRAM OXALATE 10 MG PO TABS: 10.0000 mg | ORAL_TABLET | Freq: Every day | ORAL | 2 refills | Status: DC

## 2020-09-27 MED ORDER — CIPROFLOXACIN HCL 500 MG PO TABS: 500.0000 mg | ORAL_TABLET | Freq: Two times a day (BID) | ORAL | 0 refills | Status: DC

## 2020-09-27 NOTE — Progress Notes (Signed)
[facility] [address] [place] [address]   CLINIC:  Medical Oncology/Hematology  PCP:  Celene Squibb, MD [address] [name] [address] [phone]   REASON FOR VISIT:  Follow-up for metastatic left breast cancer  PRIOR THERAPY:  1. Left lumpectomy in 04/2013. 2. Radiation therapy.  NGS Results: ER/PR positive, HER-2 negative, Ki-67 10%  CURRENT THERAPY: Arimidex daily; Ibrance 3 weeks on, 1 week off  BRIEF ONCOLOGIC HISTORY:  Oncology History   No history exists.    CANCER STAGING: Cancer Staging History of breast cancer Staging form: Breast, AJCC 7th Edition - Clinical stage from 04/20/2013: Stage IIA (T2, N0, cM0) - Unsigned - Pathologic: No stage assigned - Unsigned   INTERVAL HISTORY:  Kara Woods, a 86 y.o. female, returns for routine follow-up of her metastatic left breast cancer. Kara Woods was last seen on 09/13/2020.   Today she is accompanied by her daughter. She reports having constant achy left chest wall pain after cleaning out her closet last week and is taking Norco every 4 hours; she reports that it is worse with taking deep breaths but not with twisting or rotating of the trunk. Her appetite is down to 30% and reports not having a appetite; she is drinking 2 Boosts daily.   REVIEW OF SYSTEMS:  Review of Systems  Constitutional: Positive for appetite change (30%) and fatigue.  Cardiovascular: Positive for chest pain (9/10 L chest wall pain).  Gastrointestinal: Positive for nausea.  Genitourinary: Positive for difficulty urinating (foul odor).   Psychiatric/Behavioral: Positive for depression.  All other systems reviewed and are negative.   PAST MEDICAL/SURGICAL HISTORY:  Past Medical History:  Diagnosis Date  . Anxiety   . Arthritis   . Blind left eye   . Breast cancer ([place])    left   . Colitis   . Coronary artery disease    a. 10/2016 Staged PCI: RCA 50p, 67m(2.25x12 Resolute Onyx DES), LCX  50p, 865m2.75x23 Xience Alpine DES). Residual LAD 50p/m, 4542m  . CMarland Kitchenstocele 10/11/2013  . Depression   . Diastolic dysfunction    a. 09/2016 Echo: EF 50%, diffuse hypokinesis, mild LVH, grade 1 diastolic dysfunction, mild mitral regurgitation, mildly dilated left atrium.  . GMarland KitchenRD (gastroesophageal reflux disease)    occasional Tums only  . HOH (hard of hearing)   . Pelvic relaxation 10/11/2013  . Proctitis    colonsocopy 2007, Canasa suppositories  . S/P endoscopy March 2009   mild erosive reflux esophagitis, Schatzki's ring, s/p dilation  . Schatzki's ring   . Shortness of breath    occ if anxiety  . Wears dentures    upper  . Wears glasses    Past Surgical History:  Procedure Laterality Date  . ABDOMINAL HYSTERECTOMY    . ANTERIOR AND POSTERIOR REPAIR N/A 05/17/2014   Procedure: ANTERIOR (CYSTOCELE) AND POSTERIOR REPAIR (RECTOCELE);  Surgeon: ScoReece PackerD;  [facility];  Service: Urology;  Laterality: N/A;  . APPENDECTOMY    . BREAST LUMPECTOMY WITH NEEDLE LOCALIZATION AND AXILLARY SENTINEL LYMPH NODE BX Left 05/03/2013   Procedure: BREAST LUMPECTOMY WITH NEEDLE LOCALIZATION AND AXILLARY SENTINEL LYMPH NODE BX;  Surgeon: BenEdward JollyD;  [facility];  Laterality: Left;  . BREAST SURGERY Left 05/19/13  . CARDIAC CATHETERIZATION  1998  . CARDIAC CATHETERIZATION N/A 11/04/2016   Procedure: Left Heart Cath and Coronary Angiography;  Surgeon: ThoTroy SineD;  Location: [facility];  Service: Cardiovascular;  Laterality: N/A;  . CARDIAC CATHETERIZATION N/A 11/05/2016   Procedure: Coronary Stent Intervention;  Surgeon: Troy Sine, MD;  Location: [facility];  Service: Cardiovascular;  Laterality: N/A;  . CHOLECYSTECTOMY    . COLONOSCOPY  05/06/2006   Diffuse inflammatory changes of the rectal mucosa, consistent  with proctitis.  Otherwise, normal colon to terminal ileum  . CORONARY ANGIOPLASTY   11/04/2016  . CORONARY STENT PLACEMENT     Drug-eluting coronary artery stent, non-bioabsorbable-polymer-coated  . CYSTOSCOPY N/A 05/17/2014   Procedure: CYSTOSCOPY;  Surgeon: Reece Packer, MD;  Location: [facility];  Service: Urology;  Laterality: N/A;  . ESOPHAGOGASTRODUODENOSCOPY  02/09/2008   Schatzki ring status post dilation/Distal esophageal erosion consistent with mild erosive reflux  esophagitis, otherwise unremarkable esophagus, normal stomach, D1, D2.  . EYE SURGERY     lt cataract-implant  . EYE SURGERY     lt-lens repaced,l  . [place] SURGERY  1993  . RE-EXCISION OF BREAST LUMPECTOMY Left 05/19/2013   Procedure: RE-EXCISION OF BREAST LUMPECTOMY;  Surgeon: Edward Jolly, MD;  Location: [facility];  Service: General;  Laterality: Left;  . RETINAL DETACHMENT SURGERY  1978   Left  . VAGINAL HYSTERECTOMY Bilateral 05/17/2014   Procedure: HYSTERECTOMY VAGINAL with Bilateral Salpingo-Oophorectomy; Bladder cystotomy repair;  Surgeon: Marvene Staff, MD;  Location: [facility];  Service: Gynecology;  Laterality: Bilateral;  . VAGINAL PROLAPSE REPAIR N/A 05/17/2014   Procedure: VAGINAL VAULT PROLAPSE AND GRAFT;  Surgeon: Reece Packer, MD;  Location: [facility];  Service: Urology;  Laterality: N/A;    SOCIAL HISTORY:  Social History   Socioeconomic History  . Marital status: Widowed    Spouse name: Not on file  . Number of children: 6  . Years of education: Not on file  . Highest education level: Not on file  Occupational History  . Occupation: Retired  Tobacco Use  . Smoking status: Former Smoker    Packs/day: 1.00    Types: Cigarettes    Quit date: 04/28/1990    Years since quitting: 30.4  . Smokeless tobacco: Never Used  . Tobacco comment: quit 28 yrs ago  Substance and Sexual Activity  . Alcohol use: Yes    Alcohol/week: 1.0 standard drink    Types: 1 Glasses of wine per week    Comment: occ  . Drug use: No  . Sexual activity: Not Currently     Birth control/protection: Post-menopausal  Other Topics Concern  . Not on file  Social History Narrative  . Not on file   Social Determinants of Health   Financial Resource Strain: Low Risk   . Difficulty of Paying Living Expenses: Not hard at all  Food Insecurity: No Food Insecurity  . Worried About Charity fundraiser in the Last Year: Never true  . Ran Out of Food in the Last Year: Never true  Transportation Needs: No Transportation Needs  . Lack of Transportation (Medical): No  . Lack of Transportation (Non-Medical): No  Physical Activity: Insufficiently Active  . Days of Exercise per Week: 2 days  . Minutes of Exercise per Session: 10 min  Stress: No Stress Concern Present  . Feeling of Stress : Only a little  Social Connections: Socially Isolated  . Frequency of Communication with Friends and Family: More than three times a week  . Frequency of Social Gatherings with Friends and Family: More than three times a week  .  Attends Religious Services: Never  . Active Member of Clubs or Organizations: No  . Attends Archivist Meetings: Never  . Marital Status: Widowed  Intimate Partner Violence: Not At Risk  . Fear of Current or Ex-Partner: No  . Emotionally Abused: No  . Physically Abused: No  . Sexually Abused: No    FAMILY HISTORY:  Family History  Problem Relation Age of Onset  . Cancer Brother        spinal  . Heart attack Mother   . Heart attack Father   . Heart attack Son   . Heart attack Son   . Depression Son   . Multiple myeloma Daughter   . Anemia Daughter   . Colon cancer Neg Hx     CURRENT MEDICATIONS:  Current Outpatient Medications  Medication Sig Dispense Refill  . anastrozole (ARIMIDEX) 1 MG tablet Take 1 tablet (1 mg total) by mouth daily. 30 tablet 6  . aspirin EC 81 MG tablet Take 1 tablet (81 mg total) by mouth daily with breakfast. 90 tablet 3  . carboxymethylcellulose (REFRESH PLUS) 0.5 % SOLN Place 3-4 drops into both eyes 3  (three) times daily as needed (dry eyes). Preservative free    . clopidogrel (PLAVIX) 75 MG tablet Take 75 mg by mouth daily.     . clorazepate (TRANXENE) 15 MG tablet Take 7.5 mg by mouth daily. May take an additional 7.5 mg if needed    . ezetimibe (ZETIA) 10 MG tablet Take 10 mg by mouth daily.    Marland Kitchen gabapentin (NEURONTIN) 100 MG capsule Take 1 capsule (100 mg total) by mouth 3 (three) times daily. 90 capsule 2  . HYDROcodone-acetaminophen (NORCO) 5-325 MG tablet Take 1 tablet by mouth every 4 (four) hours as needed for moderate pain. 180 tablet 0  . latanoprost (XALATAN) 0.005 % ophthalmic solution Place 1 drop into both eyes at bedtime.    . metoprolol tartrate (LOPRESSOR) 25 MG tablet TAKE 1/2 TABLET BY MOUTH TWICE A DAY (Patient taking differently: Take 12.5 mg by mouth 2 (two) times daily. ) 90 tablet 3  . palbociclib (IBRANCE) 100 MG tablet Take 1 tablet (100 mg total) by mouth daily. Take for 21 days on, 7 days off, repeat every 28 days. 21 tablet 1  . escitalopram (LEXAPRO) 10 MG tablet Take 1 tablet (10 mg total) by mouth daily. 30 tablet 2  . nitroGLYCERIN (NITROSTAT) 0.4 MG SL tablet Place 1 tablet (0.4 mg total) under the tongue every 5 (five) minutes as needed. (Patient not taking: Reported on 09/27/2020) 25 tablet 3   No current facility-administered medications for this visit.    ALLERGIES:  Allergies  Allergen Reactions  . Contrast Media [Iodinated Diagnostic Agents] Anaphylaxis    Adverse reaction; pt hyperventilating, c/o CP and SOB   . Lipitor [Atorvastatin] Other (See Comments)    Muscle aches, cramps, fatigue, weight loss/poor appetite  . Sulfamethoxazole Other (See Comments)    Reaction was years ago unknown  . Sulfonamide Derivatives Other (See Comments)    Dizziness     PHYSICAL EXAM:  Performance status (ECOG): 1 - Symptomatic but completely ambulatory  Vitals:   09/27/20 0950 09/27/20 1000  BP:  (!) 161/78  Pulse: 65   Resp: 18   Temp: (!) 96.8 F (36  C)   SpO2: 97%    Wt Readings from Last 3 Encounters:  09/27/20 157 lb 3.2 oz (71.3 kg)  09/19/20 161 lb 3.2 oz (73.1 kg)  09/13/20 162 lb  8 oz (73.7 kg)   Physical Exam Vitals reviewed.  Constitutional:      Appearance: Normal appearance.  Cardiovascular:     Rate and Rhythm: Normal rate and regular rhythm.     Pulses: Normal pulses.     Heart sounds: Normal heart sounds.  Pulmonary:     Effort: Pulmonary effort is normal.     Breath sounds: Normal breath sounds.  Chest:     Chest wall: Tenderness (L mid-axillary line) present.  Neurological:     General: No focal deficit present.     Mental Status: She is alert and oriented to person, place, and time.  Psychiatric:        Mood and Affect: Mood normal.        Behavior: Behavior normal.      LABORATORY DATA:  I have reviewed the labs as listed.  CBC Latest Ref Rng & Units 09/27/2020 09/13/2020 08/28/2020  WBC 4.0 - 10.5 K/uL 3.6(L) 4.1 8.6  Hemoglobin 12.0 - 15.0 g/dL 11.6(L) 11.5(L) 13.1  Hematocrit 36 - 46 % 37.0 35.2(L) 41.5  Platelets 150 - 400 K/uL 179 206 270   CMP Latest Ref Rng & Units 09/27/2020 09/13/2020 08/28/2020  Glucose 70 - 99 mg/dL 97 91 105(H)  BUN 8 - 23 mg/dL 10 13 16   Creatinine 0.44 - 1.00 mg/dL 0.70 0.93 0.68  Sodium 135 - 145 mmol/L 138 139 136  Potassium 3.5 - 5.1 mmol/L 3.7 4.2 4.0  Chloride 98 - 111 mmol/L 103 100 100  CO2 22 - 32 mmol/L 27 28 26   Calcium 8.9 - 10.3 mg/dL 9.1 9.0 9.3  Total Protein 6.5 - 8.1 g/dL 7.1 7.2 7.2  Total Bilirubin 0.3 - 1.2 mg/dL 0.5 0.6 0.6  Alkaline Phos 38 - 126 U/L 57 64 88  AST 15 - 41 U/L 15 15 24   ALT 0 - 44 U/L 11 14 26    Lab Results  Component Value Date   CA2729 57.1 (H) 08/28/2020    DIAGNOSTIC IMAGING:  I have independently reviewed the scans and discussed with the patient. No results found.   ASSESSMENT:  1. T2N0 left breast infiltrating lobular carcinoma: -Status post lumpectomy in June 2014, 1.4 cm, grade 1, lymphovascular invasion  positive, ER 100%, PR 26%, Ki-67 18%. -She underwent XRT.She did not take adjuvant endocrine therapy. -Mammogram on 07/13/2020, BI-RADS Category 1. -CEA was 10.3, Ca1 2515.8 and CA 19-9 05. -PET scan on 08/07/2020 shows widespread hypermetabolic bone meta stasis with soft tissue components at T1 and sacrum. Thoracic nodal metastasis. -MRI of the thoracic spine on 08/15/2020 shows pathological fracture of T1 with mild osseous retropulsion and possible left C8 nerve root encroachment within the foramen at C7-T1. No cord deformity. -Right third rib biopsy on 08/22/2020 consistent with metastatic breast cancer, ER 100% positive, PR 40% positive, Ki-67 10%, HER-2 2+ and negative by FISH. -Ibrance 100 mg 3 weeks on 1 week off on anastrozole 1 mg daily started on 08/31/2020.  2. Social/family history: -She quit smoking 20 years ago, smoked 2 packs/day for more than 20 years prior to quitting. -She lives at home by herself and is independent of all activities. -Daughter who has multiple myeloma at age 36.   PLAN:  1.Metastatic cancer to the bones, highly likely breast cancer: -She has tolerated first cycle of Ibrance 100 mg 3 weeks on 1 week off very well.  She is tolerating anastrozole without any major vasomotor symptoms. -Reviewed labs which showed white count is 3.6 with ANC  of 828.  Platelet count is normal. -I have recommended holding Ibrance at this time.  Will check CBC next week.  If ANC improves to more than 1000, she will start cycle 2 of Ibrance. -I will see her back in 4 weeks for follow-up.  Plan to repeat tumor markers at that time.  2. CAD and RCA stenting: -Continue metoprolol and Plavix.  3.Bilateral rib pains: -Continue gabapentin 100 mg 3 times a day. -Experienced worsening of pain in in the left lateral rib cage in the last couple of weeks.  She reported pain started when she was lifting. -Recommend increasing hydrocodone 5 mg to every 4 hours as needed.  It is  helping.  4. Bone metastasis: -We will start her on denosumab after dental work completed.  5.  Depression: -We will start Zoloft 10 mg daily and titrate up as needed.  Discussed side effects.   Orders placed this encounter:  Orders Placed This Encounter  Procedures  . Urine culture  . Urinalysis, Routine w reflex microscopic     Derek Jack, MD [facility] [phone]   I, Milinda Antis, am acting as a scribe for Dr. Sanda Linger.  I, Derek Jack MD, have reviewed the above documentation for accuracy and completeness, and I agree with the above.

## 2020-09-27 NOTE — Progress Notes (Signed)
Patient is taking Ibrance and has not missed any doses and reports no side effects at this time.    Patient is reporting depression.  Orders received for lexapro to take daily.  Her ANC is 0.9.  She was instructed to hold Ibrance , labs repeated next week, then we will call her to let her know if she should restart taking Ibrance.  She verbalizes understanding.

## 2020-09-27 NOTE — Progress Notes (Signed)
Orders received from Dr. Delton Coombes for antibiotics for UTI. Patient aware.

## 2020-09-27 NOTE — Patient Instructions (Addendum)
[facility] at [facility] Discharge Instructions  You were seen today by Dr. Delton Coombes. He went over your recent results. Do not start taking the Ibrance until your labs are rechecked in 1 week. Dr. Delton Coombes will see you back in 4 weeks for labs and follow up.   Thank you for choosing [facility] at [facility] to provide your oncology and hematology care.  To afford each patient quality time with our provider, please arrive at least 15 minutes before your scheduled appointment time.   If you have a lab appointment with the [facility] please come in thru the Main Entrance and check in at the main information desk  You need to re-schedule your appointment should you arrive 10 or more minutes late.  We strive to give you quality time with our providers, and arriving late affects you and other patients whose appointments are after yours.  Also, if you no show three or more times for appointments you may be dismissed from the clinic at the providers discretion.     Again, thank you for choosing [facility].  Our hope is that these requests will decrease the amount of time that you wait before being seen by our physicians.       _____________________________________________________________  Should you have questions after your visit to [facility], please contact our office at [phone] between the hours of 8:00 a.m. and 4:30 p.m.  Voicemails left after 4:00 p.m. will not be returned until the following business day.  For prescription refill requests, have your pharmacy contact our office and allow 72 hours.    Cancer Center Support Programs:   > Cancer Support Group  2nd Tuesday of the month 1pm-2pm, Journey Room

## 2020-09-28 ENCOUNTER — Encounter (HOSPITAL_COMMUNITY): Payer: Self-pay

## 2020-09-28 LAB — CANCER ANTIGEN 27.29: CA 27.29: 43 U/mL — ABNORMAL HIGH (ref 0.0–38.6)

## 2020-09-28 LAB — CANCER ANTIGEN 15-3: CA 15-3: 42.7 U/mL — ABNORMAL HIGH (ref 0.0–25.0)

## 2020-09-29 LAB — URINE CULTURE: Culture: >=100000 — AB

## 2020-10-04 ENCOUNTER — Other Ambulatory Visit: Payer: Self-pay

## 2020-10-04 ENCOUNTER — Inpatient Hospital Stay (HOSPITAL_COMMUNITY): Payer: Medicare Other

## 2020-10-04 DIAGNOSIS — I2510 Atherosclerotic heart disease of native coronary artery without angina pectoris: Secondary | ICD-10-CM | POA: Diagnosis not present

## 2020-10-04 DIAGNOSIS — C7951 Secondary malignant neoplasm of bone: Secondary | ICD-10-CM | POA: Diagnosis not present

## 2020-10-04 DIAGNOSIS — Z853 Personal history of malignant neoplasm of breast: Secondary | ICD-10-CM | POA: Diagnosis not present

## 2020-10-04 DIAGNOSIS — F32A Depression, unspecified: Secondary | ICD-10-CM | POA: Diagnosis not present

## 2020-10-04 DIAGNOSIS — C50919 Malignant neoplasm of unspecified site of unspecified female breast: Secondary | ICD-10-CM

## 2020-10-04 DIAGNOSIS — R0781 Pleurodynia: Secondary | ICD-10-CM | POA: Diagnosis not present

## 2020-10-04 DIAGNOSIS — Z87891 Personal history of nicotine dependence: Secondary | ICD-10-CM | POA: Diagnosis not present

## 2020-10-04 LAB — CBC WITH DIFFERENTIAL/PLATELET
Abs Immature Granulocytes: 0.03 10*3/uL (ref 0.00–0.07)
Basophils Absolute: 0.1 10*3/uL (ref 0.0–0.1)
Basophils Relative: 2 %
Eosinophils Absolute: 0 10*3/uL (ref 0.0–0.5)
Eosinophils Relative: 1 %
HCT: 38.4 % (ref 36.0–46.0)
Hemoglobin: 12.1 g/dL (ref 12.0–15.0)
Immature Granulocytes: 1 %
Lymphocytes Relative: 48 %
Lymphs Abs: 2.1 10*3/uL (ref 0.7–4.0)
MCH: 30.9 pg (ref 26.0–34.0)
MCHC: 31.5 g/dL (ref 30.0–36.0)
MCV: 98.2 fL (ref 80.0–100.0)
Monocytes Absolute: 0.5 10*3/uL (ref 0.1–1.0)
Monocytes Relative: 11 %
Neutro Abs: 1.6 10*3/uL — ABNORMAL LOW (ref 1.7–7.7)
Neutrophils Relative %: 37 %
Platelets: 284 10*3/uL (ref 150–400)
RBC: 3.91 MIL/uL (ref 3.87–5.11)
RDW: 17.3 % — ABNORMAL HIGH (ref 11.5–15.5)
WBC: 4.3 10*3/uL (ref 4.0–10.5)
nRBC: 0 % (ref 0.0–0.2)

## 2020-10-10 ENCOUNTER — Telehealth (HOSPITAL_COMMUNITY): Payer: Self-pay | Admitting: Surgery

## 2020-10-10 NOTE — Telephone Encounter (Signed)
Pt's daughter Kara Woods had left a message asking about the result of her mother's labs and if her mother could restart her Ibrance.  Per Dr. Delton Coombes, the pt's WBC was 4.3 and her absolute neutrophils were 1.6, so the pt can restart her Ibrance.  I called the pt's daughter and she verbalized understanding and was told to call back if they had any more concerns.

## 2020-10-11 ENCOUNTER — Encounter (HOSPITAL_COMMUNITY): Payer: Self-pay

## 2020-10-11 ENCOUNTER — Inpatient Hospital Stay (HOSPITAL_COMMUNITY): Payer: Medicare Other

## 2020-10-11 ENCOUNTER — Other Ambulatory Visit: Payer: Self-pay

## 2020-10-11 DIAGNOSIS — C50919 Malignant neoplasm of unspecified site of unspecified female breast: Secondary | ICD-10-CM

## 2020-10-11 DIAGNOSIS — I2510 Atherosclerotic heart disease of native coronary artery without angina pectoris: Secondary | ICD-10-CM | POA: Diagnosis not present

## 2020-10-11 DIAGNOSIS — C7951 Secondary malignant neoplasm of bone: Secondary | ICD-10-CM | POA: Diagnosis not present

## 2020-10-11 DIAGNOSIS — R0781 Pleurodynia: Secondary | ICD-10-CM | POA: Diagnosis not present

## 2020-10-11 DIAGNOSIS — Z87891 Personal history of nicotine dependence: Secondary | ICD-10-CM | POA: Diagnosis not present

## 2020-10-11 DIAGNOSIS — F32A Depression, unspecified: Secondary | ICD-10-CM | POA: Diagnosis not present

## 2020-10-11 DIAGNOSIS — Z853 Personal history of malignant neoplasm of breast: Secondary | ICD-10-CM | POA: Diagnosis not present

## 2020-10-11 LAB — CBC WITH DIFFERENTIAL/PLATELET
Abs Immature Granulocytes: 0.03 10*3/uL (ref 0.00–0.07)
Basophils Absolute: 0.1 10*3/uL (ref 0.0–0.1)
Basophils Relative: 1 %
Eosinophils Absolute: 0.1 10*3/uL (ref 0.0–0.5)
Eosinophils Relative: 1 %
HCT: 41.3 % (ref 36.0–46.0)
Hemoglobin: 13.2 g/dL (ref 12.0–15.0)
Immature Granulocytes: 0 %
Lymphocytes Relative: 42 %
Lymphs Abs: 3.1 10*3/uL (ref 0.7–4.0)
MCH: 30.8 pg (ref 26.0–34.0)
MCHC: 32 g/dL (ref 30.0–36.0)
MCV: 96.5 fL (ref 80.0–100.0)
Monocytes Absolute: 0.5 10*3/uL (ref 0.1–1.0)
Monocytes Relative: 7 %
Neutro Abs: 3.6 10*3/uL (ref 1.7–7.7)
Neutrophils Relative %: 49 %
Platelets: 356 10*3/uL (ref 150–400)
RBC: 4.28 MIL/uL (ref 3.87–5.11)
RDW: 17.3 % — ABNORMAL HIGH (ref 11.5–15.5)
WBC: 7.3 10*3/uL (ref 4.0–10.5)
nRBC: 0 % (ref 0.0–0.2)

## 2020-10-11 LAB — COMPREHENSIVE METABOLIC PANEL
ALT: 13 U/L (ref 0–44)
AST: 17 U/L (ref 15–41)
Albumin: 3.9 g/dL (ref 3.5–5.0)
Alkaline Phosphatase: 65 U/L (ref 38–126)
Anion gap: 8 (ref 5–15)
BUN: 11 mg/dL (ref 8–23)
CO2: 27 mmol/L (ref 22–32)
Calcium: 9.3 mg/dL (ref 8.9–10.3)
Chloride: 105 mmol/L (ref 98–111)
Creatinine, Ser: 0.86 mg/dL (ref 0.44–1.00)
GFR, Estimated: >60 mL/min (ref >60)
Glucose, Bld: 102 mg/dL — ABNORMAL HIGH (ref 70–99)
Potassium: 4 mmol/L (ref 3.5–5.1)
Sodium: 140 mmol/L (ref 135–145)
Total Bilirubin: 0.5 mg/dL (ref 0.3–1.2)
Total Protein: 7.5 g/dL (ref 6.5–8.1)

## 2020-10-11 NOTE — Progress Notes (Signed)
Patient daughter called stating that patient has had a headache the last two days and her energy level has come down greatly. Daughter states that it is all she can do to go to the bathroom her energy is so low. Patient's daughter states that her color is terrible and patient is complaining that she feels really sick. Patient's daughter states that she does not have a fever and is wanting to know what she needs to do.  Patient recommended to go to ER if really weak. Otherwise could schedule appointment later this week.  Patient's daughter made aware and up front scheduled appointment with Dr. Raliegh Ip for tomorrow @10 :30am.

## 2020-10-12 ENCOUNTER — Inpatient Hospital Stay (HOSPITAL_BASED_OUTPATIENT_CLINIC_OR_DEPARTMENT_OTHER): Payer: Medicare Other | Admitting: Hematology

## 2020-10-12 VITALS — BP 111/71 | HR 91 | Temp 97.1°F | Resp 17 | Wt 157.6 lb

## 2020-10-12 DIAGNOSIS — Z87891 Personal history of nicotine dependence: Secondary | ICD-10-CM | POA: Diagnosis not present

## 2020-10-12 DIAGNOSIS — C50212 Malignant neoplasm of upper-inner quadrant of left female breast: Secondary | ICD-10-CM | POA: Diagnosis not present

## 2020-10-12 DIAGNOSIS — Z170 Estrogen receptor positive status [ER+]: Secondary | ICD-10-CM

## 2020-10-12 DIAGNOSIS — Z853 Personal history of malignant neoplasm of breast: Secondary | ICD-10-CM | POA: Diagnosis not present

## 2020-10-12 DIAGNOSIS — C7951 Secondary malignant neoplasm of bone: Secondary | ICD-10-CM | POA: Diagnosis not present

## 2020-10-12 DIAGNOSIS — F32A Depression, unspecified: Secondary | ICD-10-CM | POA: Diagnosis not present

## 2020-10-12 DIAGNOSIS — I2510 Atherosclerotic heart disease of native coronary artery without angina pectoris: Secondary | ICD-10-CM | POA: Diagnosis not present

## 2020-10-12 DIAGNOSIS — R0781 Pleurodynia: Secondary | ICD-10-CM | POA: Diagnosis not present

## 2020-10-12 LAB — CANCER ANTIGEN 15-3: CA 15-3: 45.8 U/mL — ABNORMAL HIGH (ref 0.0–25.0)

## 2020-10-12 LAB — CANCER ANTIGEN 27.29: CA 27.29: 47.3 U/mL — ABNORMAL HIGH (ref 0.0–38.6)

## 2020-10-12 MED ORDER — DENOSUMAB 120 MG/1.7ML ~~LOC~~ SOLN
120.0000 mg | Freq: Once | SUBCUTANEOUS | Status: AC
  Administered 2020-10-12: 120 mg via SUBCUTANEOUS
  Filled 2020-10-12: qty 1.7

## 2020-10-12 NOTE — Progress Notes (Signed)
Patient is taking the Ibrance as prescribed. She is complaining of hot flashes at night and is having to get up and change due to being drenched with sweat.  Kara Woods Likes presents today for injection per the provider's orders.  Xgeva 120mg  administration without incident; injection site WNL; see MAR for injection details.  Patient tolerated procedure well and without incident.  No questions or complaints noted at this time. Patient discharged ambulatory with family member.

## 2020-10-12 NOTE — Progress Notes (Signed)
[place] [address]  [address]   CLINIC:  Medical Oncology/Hematology  PCP:  Kara Squibb, MD [address] [place] [address] [phone]   REASON FOR VISIT:  Follow-up for metastatic left breast cancer  PRIOR THERAPY:  1. Left lumpectomy in 04/2013. 2. Radiation therapy.  NGS Results: ER/PR positive, HER-2 negative, Ki-67 10%  CURRENT THERAPY: Arimidex daily; Ibrance 100 mg 3 weeks on, 1 week off; Xgeva monthly  BRIEF ONCOLOGIC HISTORY:  Oncology History   No history exists.    CANCER STAGING: Cancer Staging History of breast cancer Staging form: Breast, AJCC 7th Edition - Clinical stage from 04/20/2013: Stage IIA (T2, N0, cM0) - Unsigned - Pathologic: No stage assigned - Unsigned   INTERVAL HISTORY:  Kara Woods, a 86 y.o. female, returns for routine follow-up of her metastatic left breast cancer. Kara [place] was last seen on 09/27/2020.   Today she is accompanied by her daughter and she reports not feeling well. She reports having pains over her entire body, her energy levels are low and she reports having drenching night sweats. Her energy levels have come down during her 2 weeks off of the Ibrance. She just started taking her second bottle of Ibrance on 11/16; she continues taking Arimidex daily. Her appetite is down and she can taste and she denies N/V/D. She takes Norco PRN. She has not started taking Lexapro and she takes chlorazepate 1/2 tablet PRN.  She had her teeth cleaning done several weeks ago and denies having any teeth being pulled.   REVIEW OF SYSTEMS:  Review of Systems  Constitutional: Positive for appetite change (50%), diaphoresis (drenching night sweats) and fatigue (depleted).  Gastrointestinal: Positive for constipation. Negative for diarrhea, nausea and vomiting.  Genitourinary: Positive for difficulty urinating (recent UTI).   Neurological: Positive for dizziness (occasional) and headaches.   Psychiatric/Behavioral: Positive for depression and sleep disturbance.  All other systems reviewed and are negative.   PAST MEDICAL/SURGICAL HISTORY:  Past Medical History:  Diagnosis Date  . Anxiety   . Arthritis   . Blind left eye   . Breast cancer ([place])    left   . Colitis   . Coronary artery disease    a. 10/2016 Staged PCI: RCA 50p, 88m(2.25x12 Resolute Onyx DES), LCX 50p, 857m2.75x23 Xience Alpine DES). Residual LAD 50p/m, 4561m  . CMarland Kitchenstocele 10/11/2013  . Depression   . Diastolic dysfunction    a. 09/2016 Echo: EF 50%, diffuse hypokinesis, mild LVH, grade 1 diastolic dysfunction, mild mitral regurgitation, mildly dilated left atrium.  . GMarland KitchenRD (gastroesophageal reflux disease)    occasional Tums only  . HOH (hard of hearing)   . Pelvic relaxation 10/11/2013  . Proctitis    colonsocopy 2007, Canasa suppositories  . S/P endoscopy March 2009   mild erosive reflux esophagitis, Schatzki's ring, s/p dilation  . Schatzki's ring   . Shortness of breath    occ if anxiety  . Wears dentures    upper  . Wears glasses    Past Surgical History:  Procedure Laterality Date  . ABDOMINAL HYSTERECTOMY    . ANTERIOR AND POSTERIOR REPAIR N/A 05/17/2014   Procedure: ANTERIOR (CYSTOCELE) AND POSTERIOR REPAIR (RECTOCELE);  Surgeon: ScoReece PackerD;  [facility];  Service: Urology;  Laterality: N/A;  . APPENDECTOMY    . BREAST LUMPECTOMY WITH NEEDLE LOCALIZATION AND AXILLARY SENTINEL LYMPH NODE BX Left 05/03/2013   Procedure: BREAST LUMPECTOMY WITH NEEDLE LOCALIZATION  AND AXILLARY SENTINEL LYMPH NODE BX;  Surgeon: Edward Jolly, MD;  Location: [facility];  Service: General;  Laterality: Left;  . BREAST SURGERY Left 05/19/13  . CARDIAC CATHETERIZATION  1998  . CARDIAC CATHETERIZATION N/A 11/04/2016   Procedure: Left Heart Cath and Coronary Angiography;  Surgeon: Troy Sine, MD;  Location: [facility];  Service: Cardiovascular;  Laterality: N/A;  .  CARDIAC CATHETERIZATION N/A 11/05/2016   Procedure: Coronary Stent Intervention;  Surgeon: Troy Sine, MD;  Location: [facility];  Service: Cardiovascular;  Laterality: N/A;  . CHOLECYSTECTOMY    . COLONOSCOPY  05/06/2006   Diffuse inflammatory changes of the rectal mucosa, consistent  with proctitis.  Otherwise, normal colon to terminal ileum  . CORONARY ANGIOPLASTY  11/04/2016  . CORONARY STENT PLACEMENT     Drug-eluting coronary artery stent, non-bioabsorbable-polymer-coated  . CYSTOSCOPY N/A 05/17/2014   Procedure: CYSTOSCOPY;  Surgeon: Reece Packer, MD;  Location: [facility];  Service: Urology;  Laterality: N/A;  . ESOPHAGOGASTRODUODENOSCOPY  02/09/2008   Schatzki ring status post dilation/Distal esophageal erosion consistent with mild erosive reflux  esophagitis, otherwise unremarkable esophagus, normal stomach, D1, D2.  . EYE SURGERY     lt cataract-implant  . EYE SURGERY     lt-lens repaced,l  . [place] SURGERY  1993  . RE-EXCISION OF BREAST LUMPECTOMY Left 05/19/2013   Procedure: RE-EXCISION OF BREAST LUMPECTOMY;  Surgeon: Edward Jolly, MD;  Location: [facility];  Service: General;  Laterality: Left;  . RETINAL DETACHMENT SURGERY  1978   Left  . VAGINAL HYSTERECTOMY Bilateral 05/17/2014   Procedure: HYSTERECTOMY VAGINAL with Bilateral Salpingo-Oophorectomy; Bladder cystotomy repair;  Surgeon: Marvene Staff, MD;  Location: [facility];  Service: Gynecology;  Laterality: Bilateral;  . VAGINAL PROLAPSE REPAIR N/A 05/17/2014   Procedure: VAGINAL VAULT PROLAPSE AND GRAFT;  Surgeon: Reece Packer, MD;  Location: [facility];  Service: Urology;  Laterality: N/A;    SOCIAL HISTORY:  Social History   Socioeconomic History  . Marital status: Widowed    Spouse name: Not on file  . Number of children: 6  . Years of education: Not on file  . Highest education level: Not on file  Occupational History  . Occupation: Retired  Tobacco Use  .  Smoking status: Former Smoker    Packs/day: 1.00    Types: Cigarettes    Quit date: 04/28/1990    Years since quitting: 30.4  . Smokeless tobacco: Never Used  . Tobacco comment: quit 28 yrs ago  Substance and Sexual Activity  . Alcohol use: Yes    Alcohol/week: 1.0 standard drink    Types: 1 Glasses of wine per week    Comment: occ  . Drug use: No  . Sexual activity: Not Currently    Birth control/protection: Post-menopausal  Other Topics Concern  . Not on file  Social History Narrative  . Not on file   Social Determinants of Health   Financial Resource Strain: Low Risk   . Difficulty of Paying Living Expenses: Not hard at all  Food Insecurity: No Food Insecurity  . Worried About Charity fundraiser in the Last Year: Never true  . Ran Out of Food in the Last Year: Never true  Transportation Needs: No Transportation Needs  . Lack of Transportation (Medical): No  . Lack of Transportation (Non-Medical): No  Physical Activity: Insufficiently Active  . Days of Exercise per Week: 2 days  . Minutes of  Exercise per Session: 10 min  Stress: No Stress Concern Present  . Feeling of Stress : Only a little  Social Connections: Socially Isolated  . Frequency of Communication with Friends and Family: More than three times a week  . Frequency of Social Gatherings with Friends and Family: More than three times a week  . Attends Religious Services: Never  . Active Member of Clubs or Organizations: No  . Attends Archivist Meetings: Never  . Marital Status: Widowed  Intimate Partner Violence: Not At Risk  . Fear of Current or Ex-Partner: No  . Emotionally Abused: No  . Physically Abused: No  . Sexually Abused: No    FAMILY HISTORY:  Family History  Problem Relation Age of Onset  . Cancer Brother        spinal  . Heart attack Mother   . Heart attack Father   . Heart attack Son   . Heart attack Son   . Depression Son   . Multiple myeloma Daughter   . Anemia Daughter     . Colon cancer Neg Hx     CURRENT MEDICATIONS:  Current Outpatient Medications  Medication Sig Dispense Refill  . anastrozole (ARIMIDEX) 1 MG tablet Take 1 tablet (1 mg total) by mouth daily. 30 tablet 6  . aspirin EC 81 MG tablet Take 1 tablet (81 mg total) by mouth daily with breakfast. 90 tablet 3  . carboxymethylcellulose (REFRESH PLUS) 0.5 % SOLN Place 3-4 drops into both eyes 3 (three) times daily as needed (dry eyes). Preservative free    . clopidogrel (PLAVIX) 75 MG tablet Take 75 mg by mouth daily.     . clorazepate (TRANXENE) 15 MG tablet Take 7.5 mg by mouth daily. May take an additional 7.5 mg if needed    . escitalopram (LEXAPRO) 10 MG tablet Take 1 tablet (10 mg total) by mouth daily. 30 tablet 2  . ezetimibe (ZETIA) 10 MG tablet Take 10 mg by mouth daily.    Marland Kitchen gabapentin (NEURONTIN) 100 MG capsule Take 1 capsule (100 mg total) by mouth 3 (three) times daily. 90 capsule 2  . HYDROcodone-acetaminophen (NORCO) 5-325 MG tablet Take 1 tablet by mouth every 4 (four) hours as needed for moderate pain. 180 tablet 0  . latanoprost (XALATAN) 0.005 % ophthalmic solution Place 1 drop into both eyes at bedtime.    . metoprolol tartrate (LOPRESSOR) 25 MG tablet TAKE 1/2 TABLET BY MOUTH TWICE A DAY (Patient taking differently: Take 12.5 mg by mouth 2 (two) times daily. ) 90 tablet 3  . palbociclib (IBRANCE) 100 MG tablet Take 1 tablet (100 mg total) by mouth daily. Take for 21 days on, 7 days off, repeat every 28 days. 21 tablet 1  . nitroGLYCERIN (NITROSTAT) 0.4 MG SL tablet Place 1 tablet (0.4 mg total) under the tongue every 5 (five) minutes as needed. (Patient not taking: Reported on 10/12/2020) 25 tablet 3   Current Facility-Administered Medications  Medication Dose Route Frequency Provider Last Rate Last Admin  . denosumab (XGEVA) injection 120 mg  120 mg Subcutaneous Once Derek Jack, MD        ALLERGIES:  Allergies  Allergen Reactions  . Contrast Media [Iodinated  Diagnostic Agents] Anaphylaxis    Adverse reaction; pt hyperventilating, c/o CP and SOB   . Lipitor [Atorvastatin] Other (See Comments)    Muscle aches, cramps, fatigue, weight loss/poor appetite  . Sulfamethoxazole Other (See Comments)    Reaction was years ago unknown  . Sulfonamide Derivatives  Other (See Comments)    Dizziness     PHYSICAL EXAM:  Performance status (ECOG): 1 - Symptomatic but completely ambulatory  Vitals:   10/12/20 1022  BP: 111/71  Pulse: 91  Resp: 17  Temp: (!) 97.1 F (36.2 C)  SpO2: 97%   Wt Readings from Last 3 Encounters:  10/12/20 157 lb 9.6 oz (71.5 kg)  09/27/20 157 lb 3.2 oz (71.3 kg)  09/19/20 161 lb 3.2 oz (73.1 kg)   Physical Exam Vitals reviewed.  Constitutional:      Appearance: Normal appearance.  Cardiovascular:     Rate and Rhythm: Normal rate and regular rhythm.     Pulses: Normal pulses.     Heart sounds: Normal heart sounds.  Pulmonary:     Effort: Pulmonary effort is normal.     Breath sounds: Normal breath sounds.  Neurological:     General: No focal deficit present.     Mental Status: She is alert and oriented to person, place, and time.  Psychiatric:        Mood and Affect: Mood is anxious.        Behavior: Behavior normal.      LABORATORY DATA:  I have reviewed the labs as listed.  CBC Latest Ref Rng & Units 10/11/2020 10/04/2020 09/27/2020  WBC 4.0 - 10.5 K/uL 7.3 4.3 3.6(L)  Hemoglobin 12.0 - 15.0 g/dL 13.2 12.1 11.6(L)  Hematocrit 36 - 46 % 41.3 38.4 37.0  Platelets 150 - 400 K/uL 356 284 179   CMP Latest Ref Rng & Units 10/11/2020 09/27/2020 09/13/2020  Glucose 70 - 99 mg/dL 102(H) 97 91  BUN 8 - 23 mg/dL 11 10 13   Creatinine 0.44 - 1.00 mg/dL 0.86 0.70 0.93  Sodium 135 - 145 mmol/L 140 138 139  Potassium 3.5 - 5.1 mmol/L 4.0 3.7 4.2  Chloride 98 - 111 mmol/L 105 103 100  CO2 22 - 32 mmol/L 27 27 28   Calcium 8.9 - 10.3 mg/dL 9.3 9.1 9.0  Total Protein 6.5 - 8.1 g/dL 7.5 7.1 7.2  Total Bilirubin 0.3 -  1.2 mg/dL 0.5 0.5 0.6  Alkaline Phos 38 - 126 U/L 65 57 64  AST 15 - 41 U/L 17 15 15   ALT 0 - 44 U/L 13 11 14    Lab Results  Component Value Date   CA2729 47.3 (H) 10/11/2020   CA2729 43.0 (H) 09/27/2020   CA2729 57.1 (H) 08/28/2020    DIAGNOSTIC IMAGING:  I have independently reviewed the scans and discussed with the patient. No results found.   ASSESSMENT:  1. T2N0 left breast infiltrating lobular carcinoma: -Status post lumpectomy in June 2014, 1.4 cm, grade 1, lymphovascular invasion positive, ER 100%, PR 26%, Ki-67 18%. -She underwent XRT.She did not take adjuvant endocrine therapy. -Mammogram on 07/13/2020, BI-RADS Category 1. -CEA was 10.3, Ca1 2515.8 and CA 19-9 05. -PET scan on 08/07/2020 shows widespread hypermetabolic bone meta stasis with soft tissue components at T1 and sacrum. Thoracic nodal metastasis. -MRI of the thoracic spine on 08/15/2020 shows pathological fracture of T1 with mild osseous retropulsion and possible left C8 nerve root encroachment within the foramen at C7-T1. No cord deformity. -Right third rib biopsy on 08/22/2020 consistent with metastatic breast cancer, ER 100% positive, PR 40% positive, Ki-67 10%, HER-2 2+ and negative by FISH. -Ibrance 100 mg 3 weeks on 1 week off on anastrozole 1 mg daily started on 08/31/2020. -Ibrance held on 10/13/2020 secondary to severe tiredness.  2. Social/family history: -She quit smoking  20 years ago, smoked 2 packs/day for more than 20 years prior to quitting. -She lives at home by herself and is independent of all activities. -Daughter who has multiple myeloma at age 59.   PLAN:  1.Metastatic cancer to the bones, highly likely breast cancer: -She started her second bottle of Ibrance 100 mg 3 weeks on 1 week off on 10/10/2020.  We had to hold her Ibrance for 1 more week as her counts were low prior to start of cycle 2. -She reported severe hot flashes at nighttime with anastrozole.  She is taking anastrozole  otherwise. -Reviewed her labs today.  White count is normal at 7.3 with normal hemoglobin and platelets.  LFTs are normal. -Initial tumor markers showed downtrending. -As she is feeling severely fatigued, I have told her to hold Ibrance at this time. -She will continue anastrozole. -She will come back in 4 weeks for follow-up. -If her energy levels improve, will consider starting on  Ibrance to 75 mg 3 weeks on 1 week off.  2. CAD and RCA stenting: -Continue metoprolol and Plavix.  3.Bilateral rib pains: -Continue gabapentin 100 mg 3 times a day. -She reported that her bilateral rib pains have improved. -She will continue hydrocodone 5/325 as needed.  4. Bone metastasis: -She had dental work which included routine cleaning done last week. -We will start her on denosumab today.  We discussed side effects including rare chance of ONJ.  She was told to take calcium supplements.  5.  Depression: -Continue Lexapro 10 mg daily.   Orders placed this encounter:  Orders Placed This Encounter  Procedures  . [place], MD [place] [phone]   I, Milinda Antis, am acting as a scribe for Dr. Sanda Linger.  I, Derek Jack MD, have reviewed the above documentation for accuracy and completeness, and I agree with the above.

## 2020-10-12 NOTE — Patient Instructions (Addendum)
[place] at [facility] Discharge Instructions  You were seen today by Dr. Delton Coombes. He went over your recent results. You will be scheduled to receive your Xgeva injection to strengthen your bones; take calcium supplements  600 mg twice daily (or 1,200 mg daily) to maintain your calcium levels. Start taking Lexapro every day and take the chlorazepate as needed. Stop taking Ibrance for now until your symptoms subside. Dr. Delton Coombes will see you back in 4 weeks for labs and follow up.   Thank you for choosing [place] at [facility] to provide your oncology and hematology care.  To afford each patient quality time with our provider, please arrive at least 15 minutes before your scheduled appointment time.   If you have a lab appointment with the [facility] please come in thru the Main Entrance and check in at the main information desk  You need to re-schedule your appointment should you arrive 10 or more minutes late.  We strive to give you quality time with our providers, and arriving late affects you and other patients whose appointments are after yours.  Also, if you no show three or more times for appointments you may be dismissed from the clinic at the providers discretion.     Again, thank you for choosing [facility].  Our hope is that these requests will decrease the amount of time that you wait before being seen by our physicians.       _____________________________________________________________  Should you have questions after your visit to [facility], please contact our office at [phone] between the hours of 8:00 a.m. and 4:30 p.m.  Voicemails left after 4:00 p.m. will not be returned until the following business day.  For prescription refill requests, have your pharmacy contact our office and allow 72 hours.    Cancer Center Support Programs:   > Cancer Support Group  2nd Tuesday of the month  1pm-2pm, Journey Room

## 2020-10-13 MED ORDER — PALBOCICLIB 75 MG PO TABS
75.0000 mg | ORAL_TABLET | Freq: Every day | ORAL | 3 refills | Status: DC
Start: 2020-10-13 — End: 2020-10-20

## 2020-10-17 ENCOUNTER — Telehealth (HOSPITAL_COMMUNITY): Payer: Self-pay

## 2020-10-17 NOTE — Telephone Encounter (Signed)
Received call from patients daughter, [name], stating patient is having chills without a fever, complaining of nausea with no vomiting, she is having generalized pain all over, poor appetite and burning in stomach with drinking liquids.  Spoke with Dr. Raliegh Ip and received orders to have patient come in Tomorrow to receive fluids and to take Mylanta for the burning in the stomach.  Daughter has been made aware of the MD orders.  No further questions at this time.

## 2020-10-18 ENCOUNTER — Other Ambulatory Visit: Payer: Self-pay

## 2020-10-18 ENCOUNTER — Inpatient Hospital Stay (HOSPITAL_COMMUNITY): Payer: Medicare Other

## 2020-10-18 VITALS — BP 120/68 | HR 80 | Temp 96.8°F | Resp 18

## 2020-10-18 DIAGNOSIS — R0781 Pleurodynia: Secondary | ICD-10-CM | POA: Diagnosis not present

## 2020-10-18 DIAGNOSIS — C7951 Secondary malignant neoplasm of bone: Secondary | ICD-10-CM | POA: Diagnosis not present

## 2020-10-18 DIAGNOSIS — Z853 Personal history of malignant neoplasm of breast: Secondary | ICD-10-CM | POA: Diagnosis not present

## 2020-10-18 DIAGNOSIS — I2510 Atherosclerotic heart disease of native coronary artery without angina pectoris: Secondary | ICD-10-CM | POA: Diagnosis not present

## 2020-10-18 DIAGNOSIS — F32A Depression, unspecified: Secondary | ICD-10-CM | POA: Diagnosis not present

## 2020-10-18 DIAGNOSIS — Z87891 Personal history of nicotine dependence: Secondary | ICD-10-CM | POA: Diagnosis not present

## 2020-10-18 MED ORDER — SODIUM CHLORIDE 0.9 % IV SOLN
Freq: Once | INTRAVENOUS | Status: AC
  Filled 2020-10-18: qty 10

## 2020-10-18 NOTE — Progress Notes (Signed)
Patient presents today for hydration.  Vitals signs stable.  Complains for weakness that is relieved by rest.  Patient taking Arimidex 1 mg daily as prescribed.  Hydration given today per MD orders.  Tolerated infusion without adverse affects.  Vital signs stable.  No complaints at this time.  Discharge from clinic ambulatory in stable condition.  Alert and oriented X 3.  Follow up with [facility] as scheduled.

## 2020-10-18 NOTE — Patient Instructions (Signed)
[facility] Discharge Instructions for Patients Receiving Hydration  Today you received the following Hydration  To help prevent nausea and vomiting after your treatment, we encourage you to take your nausea medication   If you develop nausea and vomiting that is not controlled by your nausea medication, call the clinic.   BELOW ARE SYMPTOMS THAT SHOULD BE REPORTED IMMEDIATELY:  *FEVER GREATER THAN 100.5 F  *CHILLS WITH OR WITHOUT FEVER  NAUSEA AND VOMITING THAT IS NOT CONTROLLED WITH YOUR NAUSEA MEDICATION  *UNUSUAL SHORTNESS OF BREATH  *UNUSUAL BRUISING OR BLEEDING  TENDERNESS IN MOUTH AND THROAT WITH OR WITHOUT PRESENCE OF ULCERS  *URINARY PROBLEMS  *BOWEL PROBLEMS  UNUSUAL RASH Items with * indicate a potential emergency and should be followed up as soon as possible.  Feel free to call the clinic should you have any questions or concerns. The clinic phone number is [phone].  Please show the [facility] at check-in to the Emergency Department and triage nurse.

## 2020-10-19 ENCOUNTER — Emergency Department (HOSPITAL_COMMUNITY): Payer: Medicare Other

## 2020-10-19 ENCOUNTER — Other Ambulatory Visit: Payer: Self-pay

## 2020-10-19 ENCOUNTER — Encounter (HOSPITAL_COMMUNITY): Payer: Self-pay | Admitting: Emergency Medicine

## 2020-10-19 ENCOUNTER — Observation Stay (HOSPITAL_COMMUNITY): Payer: Medicare Other

## 2020-10-19 ENCOUNTER — Observation Stay (HOSPITAL_COMMUNITY)
Admission: EM | Admit: 2020-10-19 | Discharge: 2020-10-20 | Disposition: A | Discharge status: Home / Self Care | Payer: Medicare Other | Attending: Emergency Medicine | Admitting: Emergency Medicine

## 2020-10-19 ENCOUNTER — Observation Stay (HOSPITAL_BASED_OUTPATIENT_CLINIC_OR_DEPARTMENT_OTHER): Payer: Medicare Other

## 2020-10-19 DIAGNOSIS — G459 Transient cerebral ischemic attack, unspecified: Secondary | ICD-10-CM | POA: Diagnosis not present

## 2020-10-19 DIAGNOSIS — Z853 Personal history of malignant neoplasm of breast: Secondary | ICD-10-CM | POA: Insufficient documentation

## 2020-10-19 DIAGNOSIS — Z7982 Long term (current) use of aspirin: Secondary | ICD-10-CM | POA: Diagnosis not present

## 2020-10-19 DIAGNOSIS — R42 Dizziness and giddiness: Secondary | ICD-10-CM | POA: Diagnosis not present

## 2020-10-19 DIAGNOSIS — Z20822 Contact with and (suspected) exposure to covid-19: Secondary | ICD-10-CM | POA: Insufficient documentation

## 2020-10-19 DIAGNOSIS — I25110 Atherosclerotic heart disease of native coronary artery with unstable angina pectoris: Secondary | ICD-10-CM | POA: Insufficient documentation

## 2020-10-19 DIAGNOSIS — J9811 Atelectasis: Secondary | ICD-10-CM | POA: Diagnosis not present

## 2020-10-19 DIAGNOSIS — Z79899 Other long term (current) drug therapy: Secondary | ICD-10-CM | POA: Diagnosis not present

## 2020-10-19 DIAGNOSIS — I6523 Occlusion and stenosis of bilateral carotid arteries: Secondary | ICD-10-CM | POA: Diagnosis not present

## 2020-10-19 DIAGNOSIS — Z87891 Personal history of nicotine dependence: Secondary | ICD-10-CM | POA: Diagnosis not present

## 2020-10-19 DIAGNOSIS — R531 Weakness: Secondary | ICD-10-CM | POA: Diagnosis not present

## 2020-10-19 DIAGNOSIS — I119 Hypertensive heart disease without heart failure: Secondary | ICD-10-CM | POA: Insufficient documentation

## 2020-10-19 DIAGNOSIS — I10 Essential (primary) hypertension: Secondary | ICD-10-CM | POA: Diagnosis not present

## 2020-10-19 DIAGNOSIS — I639 Cerebral infarction, unspecified: Secondary | ICD-10-CM | POA: Diagnosis not present

## 2020-10-19 DIAGNOSIS — C50919 Malignant neoplasm of unspecified site of unspecified female breast: Secondary | ICD-10-CM | POA: Diagnosis not present

## 2020-10-19 DIAGNOSIS — R29818 Other symptoms and signs involving the nervous system: Secondary | ICD-10-CM | POA: Diagnosis not present

## 2020-10-19 LAB — CBC
HCT: 40.9 % (ref 36.0–46.0)
Hemoglobin: 13.5 g/dL (ref 12.0–15.0)
MCH: 31.7 pg (ref 26.0–34.0)
MCHC: 33 g/dL (ref 30.0–36.0)
MCV: 96 fL (ref 80.0–100.0)
Platelets: 319 10*3/uL (ref 150–400)
RBC: 4.26 MIL/uL (ref 3.87–5.11)
RDW: 17.3 % — ABNORMAL HIGH (ref 11.5–15.5)
WBC: 7 10*3/uL (ref 4.0–10.5)
nRBC: 0 % (ref 0.0–0.2)

## 2020-10-19 LAB — URINALYSIS, ROUTINE W REFLEX MICROSCOPIC
Bacteria, UA: NONE SEEN
Bilirubin Urine: NEGATIVE
Glucose, UA: NEGATIVE mg/dL
Ketones, ur: NEGATIVE mg/dL
Leukocytes,Ua: NEGATIVE
Nitrite: NEGATIVE
Protein, ur: NEGATIVE mg/dL
Specific Gravity, Urine: 1.004 — ABNORMAL LOW (ref 1.005–1.030)
pH: 8 (ref 5.0–8.0)

## 2020-10-19 LAB — I-STAT CHEM 8, ED
BUN: 6 mg/dL — ABNORMAL LOW (ref 8–23)
Calcium, Ion: 1.07 mmol/L — ABNORMAL LOW (ref 1.15–1.40)
Chloride: 105 mmol/L (ref 98–111)
Creatinine, Ser: 0.6 mg/dL (ref 0.44–1.00)
Glucose, Bld: 101 mg/dL — ABNORMAL HIGH (ref 70–99)
HCT: 41 % (ref 36.0–46.0)
Hemoglobin: 13.9 g/dL (ref 12.0–15.0)
Potassium: 4.4 mmol/L (ref 3.5–5.1)
Sodium: 141 mmol/L (ref 135–145)
TCO2: 23 mmol/L (ref 22–32)

## 2020-10-19 LAB — PROTIME-INR
INR: 0.9 (ref 0.8–1.2)
Prothrombin Time: 12 seconds (ref 11.4–15.2)

## 2020-10-19 LAB — DIFFERENTIAL
Abs Immature Granulocytes: 0.02 10*3/uL (ref 0.00–0.07)
Basophils Absolute: 0 10*3/uL (ref 0.0–0.1)
Basophils Relative: 1 %
Eosinophils Absolute: 0.1 10*3/uL (ref 0.0–0.5)
Eosinophils Relative: 1 %
Immature Granulocytes: 0 %
Lymphocytes Relative: 39 %
Lymphs Abs: 2.7 10*3/uL (ref 0.7–4.0)
Monocytes Absolute: 0.3 10*3/uL (ref 0.1–1.0)
Monocytes Relative: 5 %
Neutro Abs: 3.8 10*3/uL (ref 1.7–7.7)
Neutrophils Relative %: 54 %

## 2020-10-19 LAB — COMPREHENSIVE METABOLIC PANEL
ALT: 11 U/L (ref 0–44)
AST: 16 U/L (ref 15–41)
Albumin: 4.3 g/dL (ref 3.5–5.0)
Alkaline Phosphatase: 61 U/L (ref 38–126)
Anion gap: 9 (ref 5–15)
BUN: 8 mg/dL (ref 8–23)
CO2: 23 mmol/L (ref 22–32)
Calcium: 9 mg/dL (ref 8.9–10.3)
Chloride: 105 mmol/L (ref 98–111)
Creatinine, Ser: 0.57 mg/dL (ref 0.44–1.00)
GFR, Estimated: >60 mL/min (ref >60)
Glucose, Bld: 100 mg/dL — ABNORMAL HIGH (ref 70–99)
Potassium: 4.4 mmol/L (ref 3.5–5.1)
Sodium: 137 mmol/L (ref 135–145)
Total Bilirubin: 0.6 mg/dL (ref 0.3–1.2)
Total Protein: 7.7 g/dL (ref 6.5–8.1)

## 2020-10-19 LAB — RAPID URINE DRUG SCREEN, HOSP PERFORMED
Amphetamines: NOT DETECTED
Barbiturates: NOT DETECTED
Benzodiazepines: POSITIVE — AB
Cocaine: NOT DETECTED
Opiates: POSITIVE — AB
Tetrahydrocannabinol: NOT DETECTED

## 2020-10-19 LAB — ETHANOL: Alcohol, Ethyl (B): <10 mg/dL (ref <10)

## 2020-10-19 LAB — ECHOCARDIOGRAM COMPLETE
Area-P 1/2: 4.01 cm2
S' Lateral: 2.76 cm
Weight: 2601.6 oz

## 2020-10-19 LAB — TSH: TSH: 1.339 u[IU]/mL (ref 0.350–4.500)

## 2020-10-19 LAB — RESP PANEL BY RT-PCR (FLU A&B, COVID) ARPGX2
Influenza A by PCR: NEGATIVE
Influenza B by PCR: NEGATIVE
SARS Coronavirus 2 by RT PCR: NEGATIVE

## 2020-10-19 LAB — VITAMIN D 25 HYDROXY (VIT D DEFICIENCY, FRACTURES): Vit D, 25-Hydroxy: 24.38 ng/mL — ABNORMAL LOW (ref 30–100)

## 2020-10-19 LAB — CBG MONITORING, ED: Glucose-Capillary: 83 mg/dL (ref 70–99)

## 2020-10-19 LAB — VITAMIN B12: Vitamin B-12: 557 pg/mL (ref 180–914)

## 2020-10-19 LAB — APTT: aPTT: 28 seconds (ref 24–36)

## 2020-10-19 IMAGING — DX DG CHEST 1V PORT
1 series · 1 of 1 positions shown · non-contrast
Comparison: Chest radiograph [DATE].

CLINICAL DATA: Weakness.  History of breast cancer.

EXAM:
PORTABLE CHEST 1 VIEW

[chest ap]
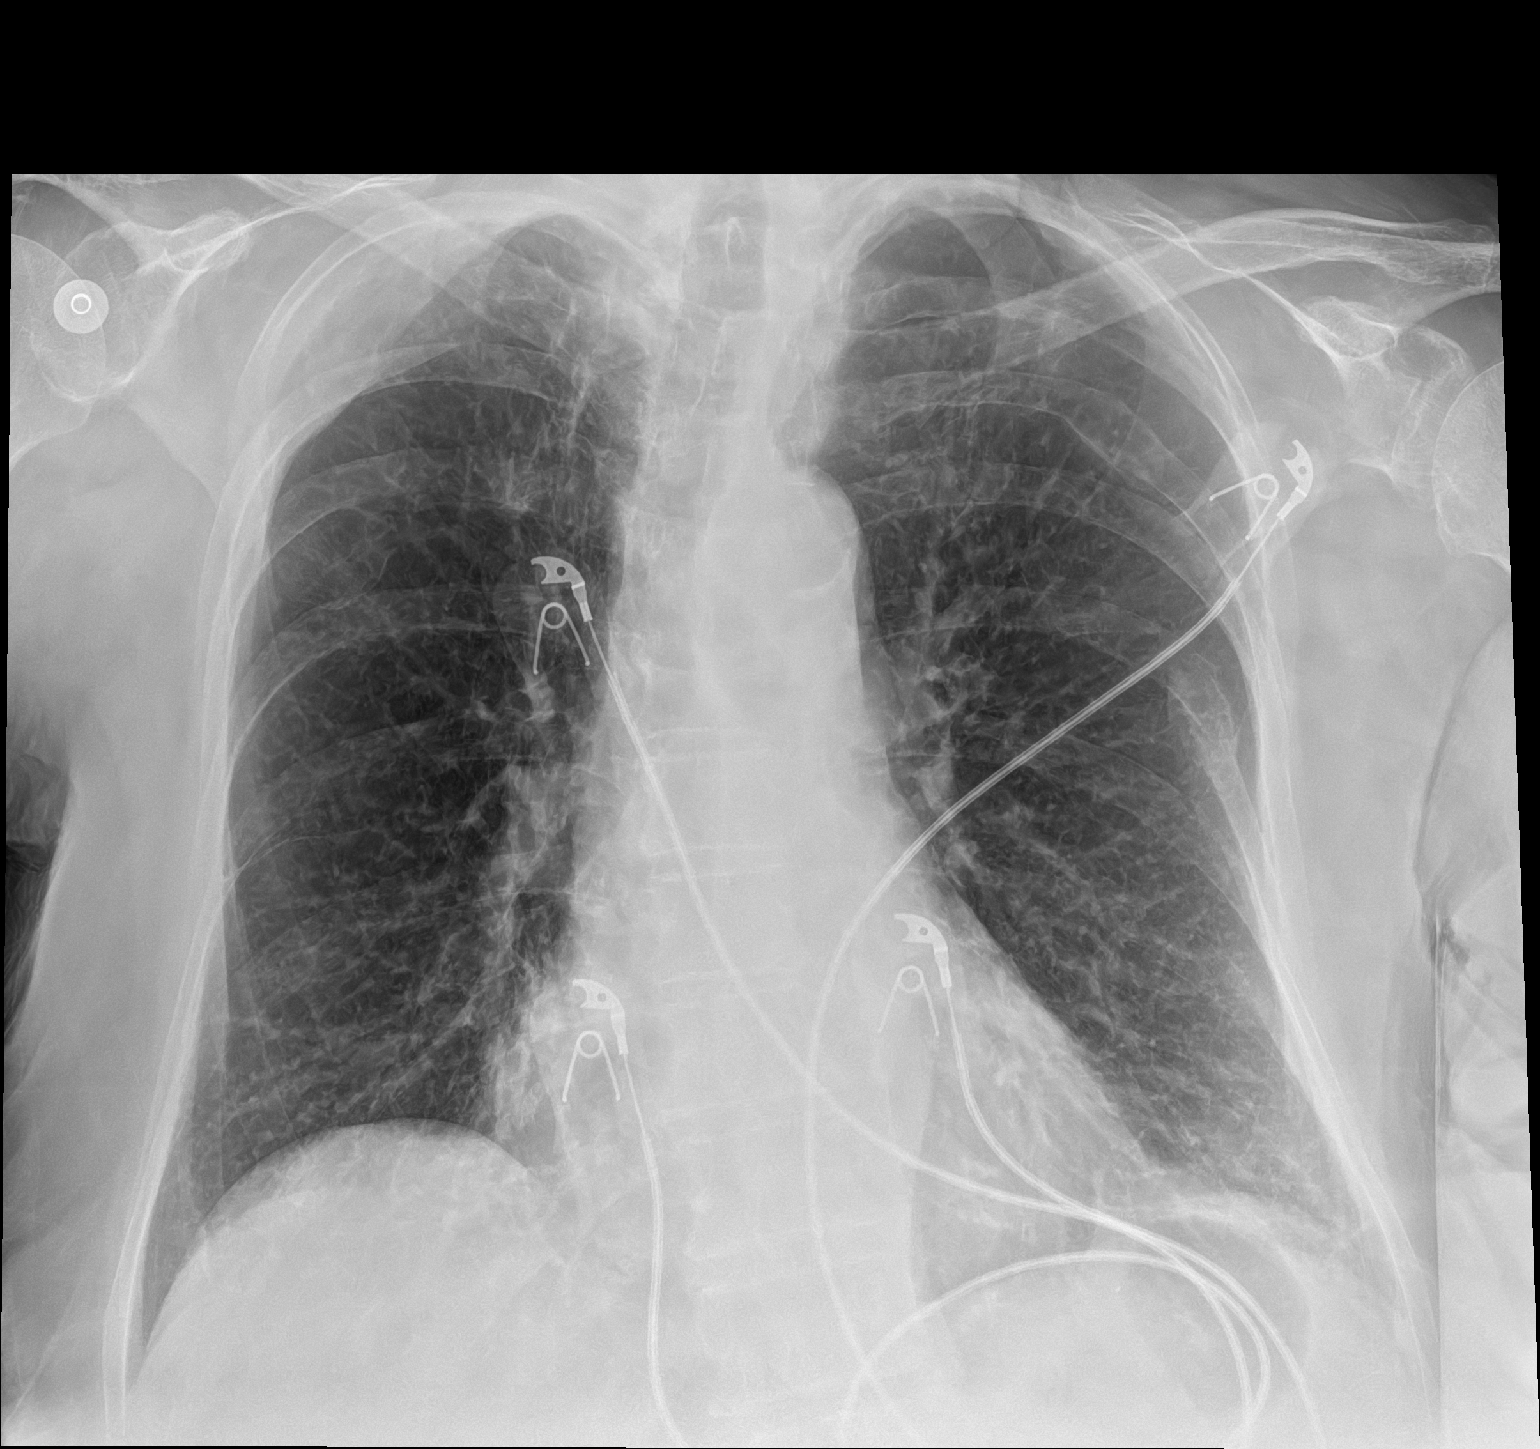

[1 of 1 positions shown; findings below may reference images not displayed]

FINDINGS: Monitoring leads overlie the patient. Stable cardiac and mediastinal
contours. Minimal heterogeneous opacities bilateral lung bases may
represent atelectasis. Opacity within the right upper hemithorax
compatible with destructive osseous metastasis and associated soft
tissue lesions. Additionally there is progressed fracturing of the
left second, fifth and seventh ribs, most compatible with pathologic
fractures. Known thoracic osseous metastasis not well visualized on
current portable exam.
IMPRESSION: 1. Probable pathologic fractures of the left second, fifth and
seventh ribs.
2. Destructive osseous metastasis within the right upper hemithorax.
3. Left greater than right basilar atelectasis.

## 2020-10-19 IMAGING — US US CAROTID DUPLEX BILAT
1 series · 13 of 24 positions shown · non-contrast
Comparison: None.

CLINICAL DATA: 87-year-old female with history of TIA

EXAM:
BILATERAL CAROTID DUPLEX ULTRASOUND
TECHNIQUE: Gray scale imaging, color Doppler and duplex ultrasound were
performed of bilateral carotid and vertebral arteries in the neck.

[Series 1: us carotid bilateral · 13 of 68 slices shown]
[im 1/68]
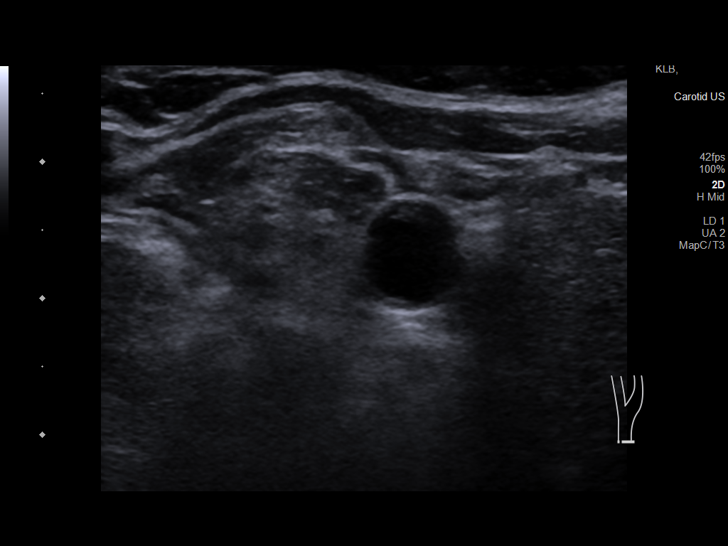
[im 6/68]
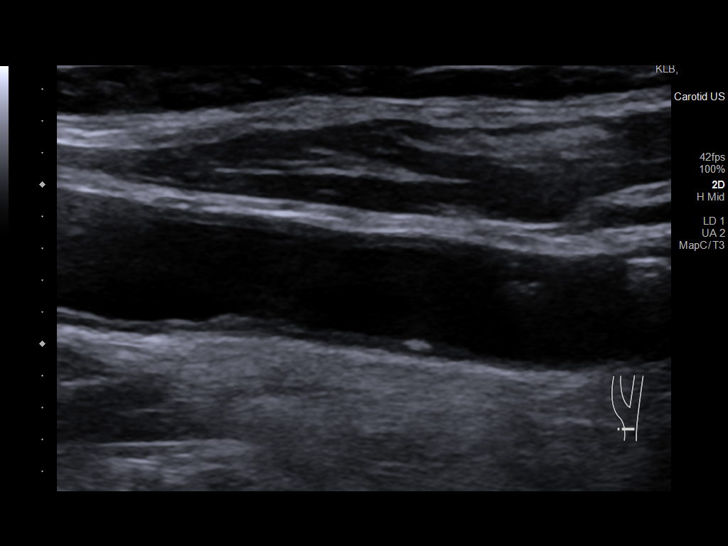
[im 12/68]
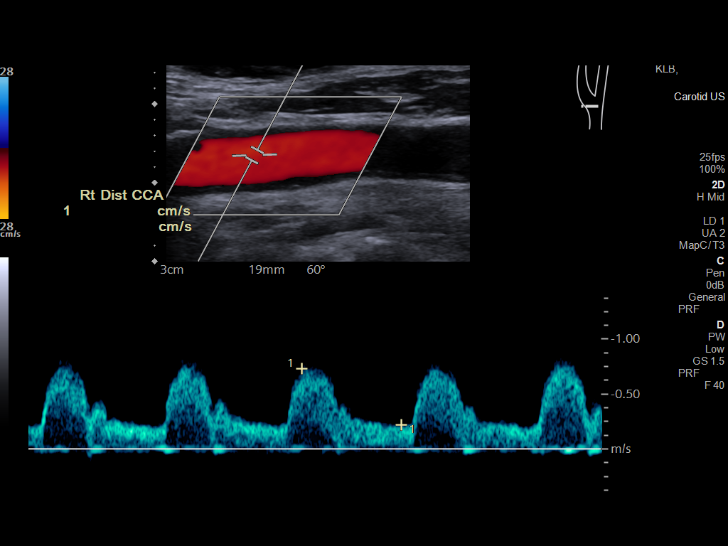
[im 18/68]
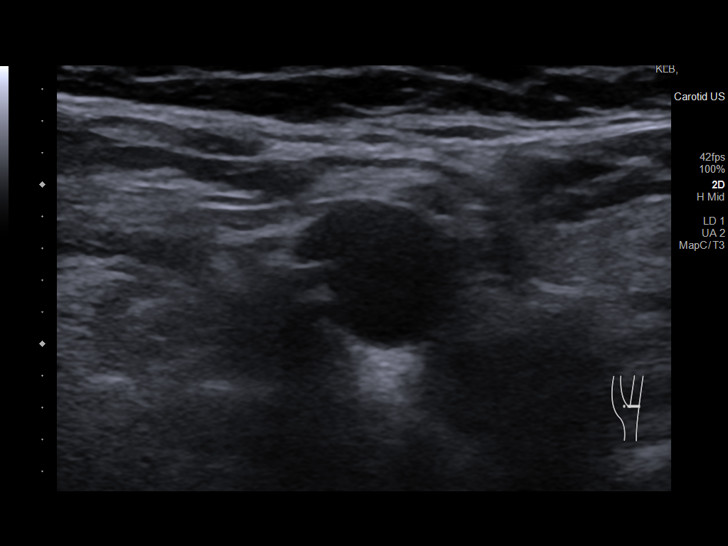
[im 24/68]
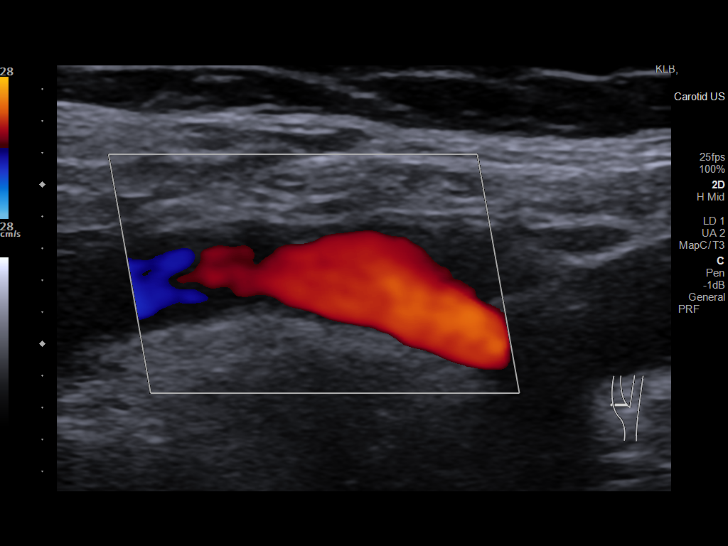
[im 30/68]
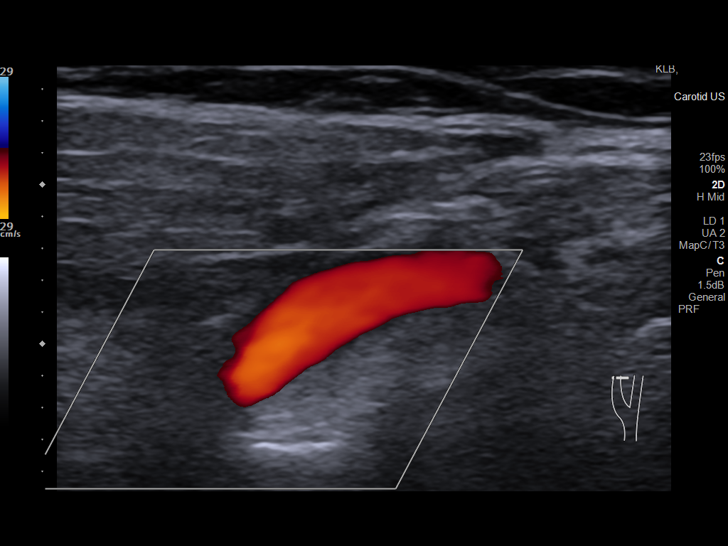
[im 35/68]
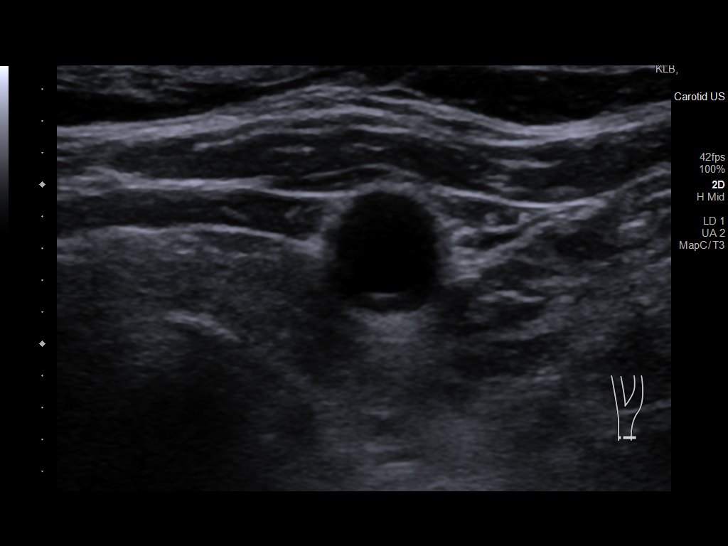
[im 38/68]
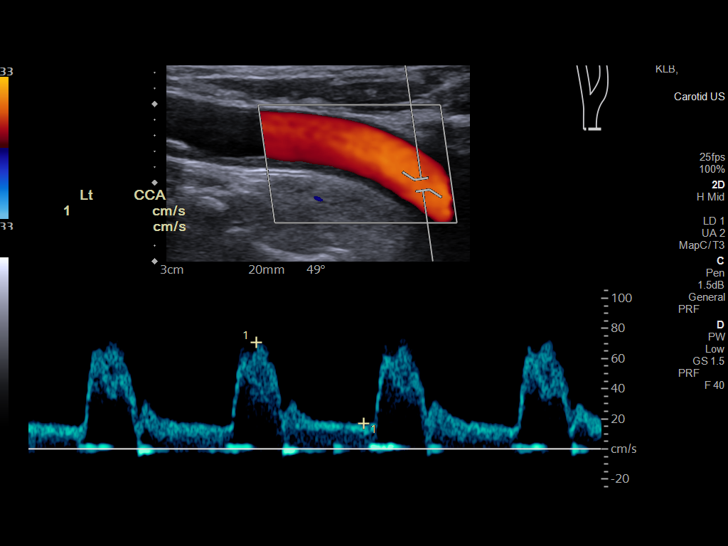
[im 44/68]
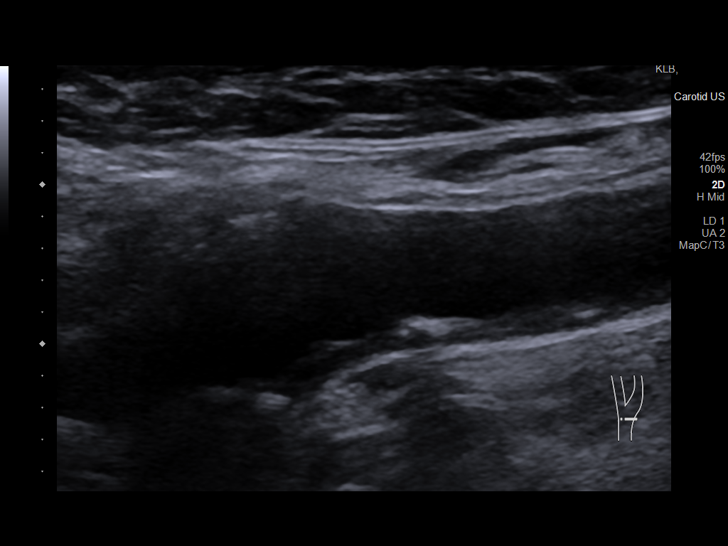
[im 50/68]
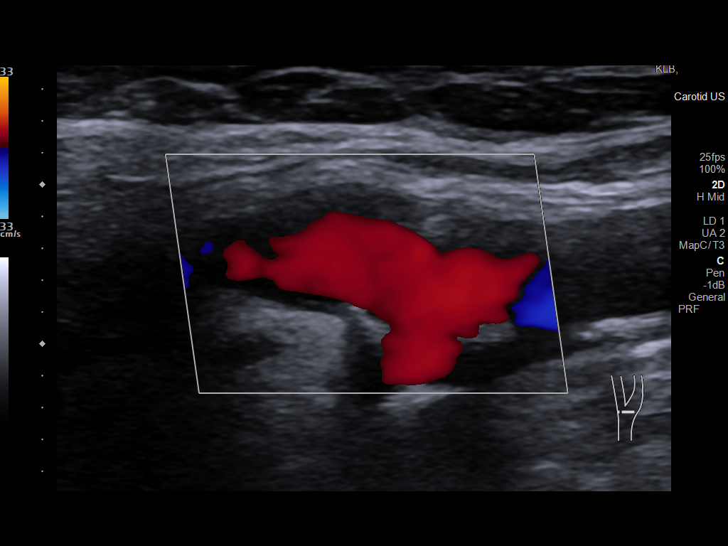
[im 56/68]
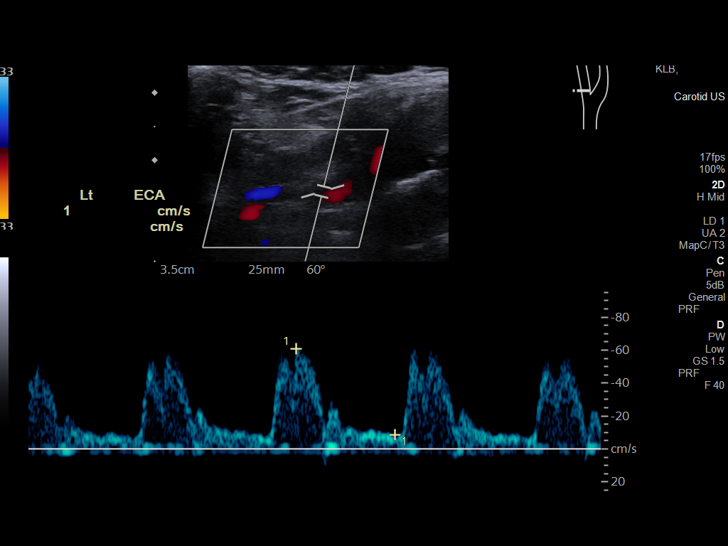
[im 62/68]
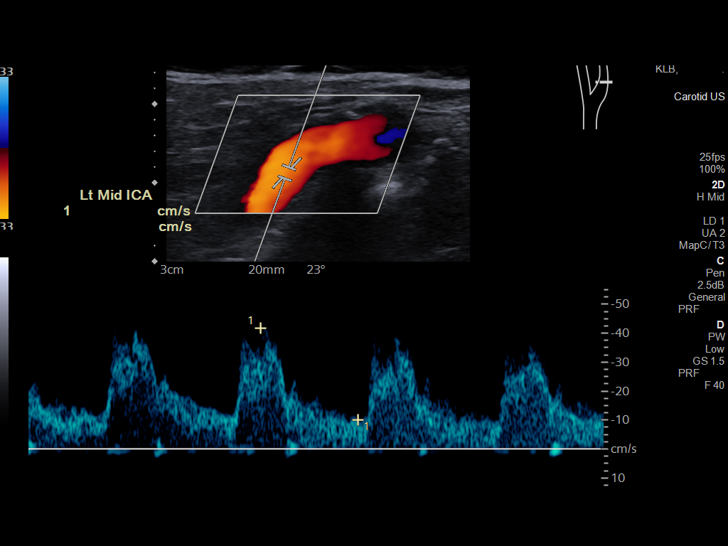
[im 68/68]
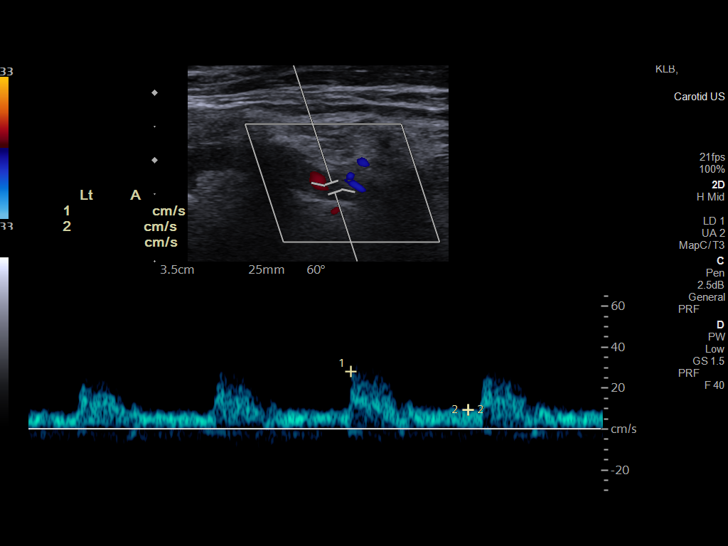

[13 of 24 positions shown; findings below may reference images not displayed]

FINDINGS: Criteria: Quantification of carotid stenosis is based on velocity
parameters that correlate the residual internal carotid diameter
with NASCET-based stenosis levels, using the diameter of the distal
internal carotid lumen as the denominator for stenosis measurement.

The following velocity measurements were obtained:

RIGHT

ICA: Peak systolic velocity 67 cm/sec, End diastolic velocity 16
cm/sec

CCA: Peak systolic velocity 63 cm/sec

SYSTOLIC ICA/CCA RATIO:

ECA: Peak systolic velocity 252 cm/sec

LEFT

ICA: Peak systolic velocity 71 cm/sec, End diastolic velocity 23
cm/sec

CCA: 83 cm/sec

SYSTOLIC ICA/CCA RATIO:

ECA: 61 cm/sec

RIGHT CAROTID ARTERY: Mild multifocal, circumferential
atherosclerotic plaque formation. No significant tortuosity. Normal
low resistance waveforms.

RIGHT VERTEBRAL ARTERY:  Antegrade flow.

LEFT CAROTID ARTERY: Mild multifocal, circumferential
atherosclerotic plaque formation. No significant tortuosity. Normal
low resistance waveforms.

LEFT VERTEBRAL ARTERY:  Antegrade flow.

Upper extremity non-invasive blood pressures:

Not obtained.
IMPRESSION: 1. Right carotid artery system: Less than 50% stenosis secondary to
mild multifocal, circumferential atherosclerotic plaque formation.

2. Left carotid artery system: Less than 50% stenosis secondary to
mild multifocal, circumferential atherosclerotic plaque formation.

3.  Vertebral artery system: Patent with antegrade flow bilaterally.

## 2020-10-19 MED ORDER — SENNOSIDES-DOCUSATE SODIUM 8.6-50 MG PO TABS
1.0000 | ORAL_TABLET | Freq: Every evening | ORAL | Status: DC | PRN
  Filled 2020-10-19: qty 1

## 2020-10-19 MED ORDER — CLOPIDOGREL BISULFATE 75 MG PO TABS
75.0000 mg | ORAL_TABLET | Freq: Every day | ORAL | Status: DC
  Administered 2020-10-19 – 2020-10-20 (×2): 75 mg via ORAL
  Filled 2020-10-19 (×2): qty 1

## 2020-10-19 MED ORDER — EZETIMIBE 10 MG PO TABS
10.0000 mg | ORAL_TABLET | Freq: Every day | ORAL | Status: DC
  Administered 2020-10-19: 10 mg via ORAL
  Filled 2020-10-19 (×3): qty 1

## 2020-10-19 MED ORDER — ESCITALOPRAM OXALATE 10 MG PO TABS
10.0000 mg | ORAL_TABLET | Freq: Every day | ORAL | Status: DC
  Administered 2020-10-19 – 2020-10-20 (×2): 10 mg via ORAL
  Filled 2020-10-19 (×2): qty 1

## 2020-10-19 MED ORDER — LATANOPROST 0.005 % OP SOLN
1.0000 [drp] | Freq: Every day | OPHTHALMIC | Status: DC
  Administered 2020-10-19: 1 [drp] via OPHTHALMIC
  Filled 2020-10-19 (×2): qty 2.5

## 2020-10-19 MED ORDER — ACETAMINOPHEN 325 MG PO TABS
650.0000 mg | ORAL_TABLET | ORAL | Status: DC | PRN
  Administered 2020-10-19 – 2020-10-20 (×2): 650 mg via ORAL
  Filled 2020-10-19 (×2): qty 2

## 2020-10-19 MED ORDER — CARBOXYMETHYLCELLULOSE SODIUM 0.5 % OP SOLN: 3.0000 [drp] | Freq: Three times a day (TID) | OPHTHALMIC | Status: DC | PRN

## 2020-10-19 MED ORDER — ACETAMINOPHEN 160 MG/5ML PO SOLN: 650.0000 mg | ORAL | Status: DC | PRN

## 2020-10-19 MED ORDER — SODIUM CHLORIDE 0.9 % IV SOLN: INTRAVENOUS | Status: AC

## 2020-10-19 MED ORDER — HYDROCODONE-ACETAMINOPHEN 5-325 MG PO TABS
1.0000 | ORAL_TABLET | Freq: Four times a day (QID) | ORAL | Status: DC | PRN
  Administered 2020-10-19 (×2): 1 via ORAL
  Filled 2020-10-19 (×2): qty 1

## 2020-10-19 MED ORDER — METOPROLOL TARTRATE 25 MG PO TABS
12.5000 mg | ORAL_TABLET | Freq: Two times a day (BID) | ORAL | Status: DC
  Administered 2020-10-19 – 2020-10-20 (×3): 12.5 mg via ORAL
  Filled 2020-10-19 (×3): qty 1

## 2020-10-19 MED ORDER — GABAPENTIN 100 MG PO CAPS
100.0000 mg | ORAL_CAPSULE | Freq: Three times a day (TID) | ORAL | Status: DC
  Administered 2020-10-19 – 2020-10-20 (×4): 100 mg via ORAL
  Filled 2020-10-19 (×6): qty 1

## 2020-10-19 MED ORDER — STROKE: EARLY STAGES OF RECOVERY BOOK
Freq: Once | Status: DC
  Filled 2020-10-19: qty 1

## 2020-10-19 MED ORDER — HEPARIN SODIUM (PORCINE) 5000 UNIT/ML IJ SOLN
5000.0000 [IU] | Freq: Three times a day (TID) | INTRAMUSCULAR | Status: DC
  Administered 2020-10-19 – 2020-10-20 (×4): 5000 [IU] via SUBCUTANEOUS
  Filled 2020-10-19 (×4): qty 1

## 2020-10-19 MED ORDER — ACETAMINOPHEN 650 MG RE SUPP: 650.0000 mg | RECTAL | Status: DC | PRN

## 2020-10-19 MED ORDER — PANTOPRAZOLE SODIUM 40 MG PO TBEC
40.0000 mg | DELAYED_RELEASE_TABLET | Freq: Every day | ORAL | Status: DC
  Administered 2020-10-19 – 2020-10-20 (×2): 40 mg via ORAL
  Filled 2020-10-19 (×2): qty 1

## 2020-10-19 MED ORDER — POLYVINYL ALCOHOL 1.4 % OP SOLN
3.0000 [drp] | Freq: Three times a day (TID) | OPHTHALMIC | Status: DC | PRN
  Filled 2020-10-19: qty 15

## 2020-10-19 MED ORDER — CLORAZEPATE DIPOTASSIUM 7.5 MG PO TABS
7.5000 mg | ORAL_TABLET | Freq: Two times a day (BID) | ORAL | Status: DC | PRN
  Administered 2020-10-20: 7.5 mg via ORAL
  Filled 2020-10-19: qty 1

## 2020-10-19 MED ORDER — HYDROCODONE-ACETAMINOPHEN 5-325 MG PO TABS
1.0000 | ORAL_TABLET | Freq: Once | ORAL | Status: AC
  Administered 2020-10-19: 1 via ORAL
  Filled 2020-10-19: qty 1

## 2020-10-19 MED ORDER — ANASTROZOLE 1 MG PO TABS
1.0000 mg | ORAL_TABLET | Freq: Every day | ORAL | Status: DC
  Administered 2020-10-19 – 2020-10-20 (×2): 1 mg via ORAL
  Filled 2020-10-19 (×5): qty 1

## 2020-10-19 MED ORDER — ASPIRIN EC 81 MG PO TBEC
162.0000 mg | DELAYED_RELEASE_TABLET | Freq: Every day | ORAL | Status: DC
  Administered 2020-10-19 – 2020-10-20 (×2): 162 mg via ORAL
  Filled 2020-10-19 (×2): qty 2

## 2020-10-19 NOTE — Consult Note (Signed)
[facility] Tele[facility]rology Consult Services   Date of Service:   10/19/2020 06:58:25  Impression:     .  G45.9 - Transient cerebral ischemic attack, unspecified  Comments/Sign-Out: This is an 86 yo woman who presents for evaluation of difficulty speaking. On my exam, she is speaking in breathless whispers but is not aphasic. She has no focal deficits, no gaze deviation. I suppose TIA is possible but her symptoms may also be due to her dyspnea. Recommend admission for further workup. Continue on aspirin and plavix.  Metrics: Last Known Well: Unknown [facility] Notification Time: 10/19/2020 06:58:24 Arrival Time: 10/19/2020 06:49:00 Stamp Time: 10/19/2020 06:58:25 Time First Login Attempt: 10/19/2020 07:01:53 Symptoms: speech difficulties. NIHSS Start Assessment Time: 10/19/2020 07:03:46 Patient is not a candidate for Thrombolytic. Thrombolytic Medical Decision: 10/19/2020 07:08:54 Patient was not deemed candidate for Thrombolytic because of following reasons: Last Well Known Above 4.5 Hours.  CT head was reviewed.  ED Physician notified of diagnostic impression and management plan on 10/19/2020 07:15:51  Advanced Imaging: Advanced Imaging Not Recommended because:  low suspicion of LVO and allergy   Our recommendations are outlined below.  Recommendations:     .  Activate Stroke Protocol Admission/Order Set     .  Stroke/Telemetry Floor     .  Neuro Checks     .  Bedside Swallow Eval     .  DVT Prophylaxis     .  IV Fluids, Normal Saline     .  Head of Bed 30 Degrees     .  Euglycemia and Avoid Hyperthermia (PRN Acetaminophen)     .  Initiate Aspirin     .  Initiate Plavix  Routine Consultation with [facility] for Follow up Care  Sign Out:     .  Discussed with Emergency Department Provider    ------------------------------------------------------------------------------  History of Present Illness: Patient is a 86 year old  Female.  Patient was brought by EMS for symptoms of speech difficulties.  This is an 86 yo woman with history of TIA, CAD who presents for evaluation of stuttering, possibly slurred speech. Report is second hand, family is not present. We do not know what time she was last seen normal. Head CT done before my arrival, and it She is stating she is having difficulty breating, but O2 sats are normal.    Past Medical History:     . Hypertension  Anticoagulant use:  No  Antiplatelet use: Yes aspirin, plavix  Allergies:  Reviewed Allergies Text: contrast     Examination: BP(156/66), Pulse(80), Blood Glucose(pending) 1A: Level of Consciousness - Alert; keenly responsive + 0 1B: Ask Month and Age - Both Questions Right + 0 1C: Blink Eyes & Squeeze Hands - Performs Both Tasks + 0 2: Test Horizontal Extraocular Movements - Normal + 0 3: Test Visual Fields - No Visual Loss + 0 4: Test Facial Palsy (Use Grimace if Obtunded) - Normal symmetry + 0 5A: Test Left Arm Motor Drift - No Drift for 10 Seconds + 0 5B: Test Right Arm Motor Drift - No Drift for 10 Seconds + 0 6A: Test Left Leg Motor Drift - No Drift for 5 Seconds + 0 6B: Test Right Leg Motor Drift - No Drift for 5 Seconds + 0 7: Test Limb Ataxia (FNF/Heel-Shin) - No Ataxia + 0 8: Test Sensation - Normal; No sensory loss + 0 9: Test Language/Aphasia - Normal; No aphasia + 0 10: Test Dysarthria - Normal + 0 11: Test Extinction/Inattention -  No abnormality + 0  NIHSS Score: 0  Pre-Morbid Modified Rankin Scale: Unable to assess   Patient/Family was informed the Neurology Consult would occur via TeleHealth consult by way of interactive audio and video telecommunications and consented to receiving care in this manner.   Patient is being evaluated for possible acute neurologic impairment and high probability of imminent or life-threatening deterioration. I spent total of 20 minutes providing care to this patient, including time for  face to face visit via telemedicine, review of medical records, imaging studies and discussion of findings with providers, the patient and/or family.   Dr Precious Gilding   [facility] [phone]  Case [id number]

## 2020-10-19 NOTE — ED Triage Notes (Signed)
Per daughter, family heard pt "babbling" at [phone] this morning. Went to check on pt and pt could not talk.  Called EMS.  Pt "babbling" with MD.  Martin Majestic to CT. Pt able to complete sentences.

## 2020-10-19 NOTE — ED Provider Notes (Signed)
Kara Woods does have MRI on Friday tomorrow available. She will be admitted to hospital and most likely getting an MRI tomorrow   Milton Ferguson, MD 10/19/20 [phone]

## 2020-10-19 NOTE — ED Notes (Signed)
[place] paged @ [phone]

## 2020-10-19 NOTE — ED Provider Notes (Addendum)
MSE was initiated and I personally evaluated the patient and placed orders (if any) at  6:49 AM on October 19, 2020.  The patient appears stable so that the remainder of the MSE may be completed by another provider.  Patient has history of TIA.  Evidently family heard her this morning and when they went in her room she was stuttering.  They state this is similar to what she had before for TIA per EMS.  Patient is awake and alert.  She is following commands.  However she has rhythmic contractions of her mouth and is stuttering.  Her grips are very weak but equal, she has no obvious facial weakness.  Code stroke was called and patient was taken by EMS to [facility].  Teleneurology consult was ordered.  Review of chart shows patient has breast cancer, stage IIa, followed by Dr. Delton Coombes.  7:14 AM radiologist called her head CT results which were without acute changes.  07:17 AM Teleneurologist, has evaluated patient, no focal weakness, no dysphagia or dysarthia. Pt c/o SOB to him and whispering.  States to make sure on ASA and plavix and admit.   MRI brain July 24, 2020 IMPRESSION: Mildly motion degraded examination.  No evidence of acute intracranial abnormality, including acute infarction.  Moderate chronic small vessel ischemic changes within cerebral white matter.  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  Mild generalized parenchymal atrophy.   Electronically Signed   By: Kellie Simmering DO   On: 07/24/2020 15:12   Rolland Porter, MD 10/19/20 [id number]    Rolland Porter, MD 10/19/20 [id number]    Rolland Porter, MD 10/19/20 [phone]

## 2020-10-19 NOTE — H&P (Signed)
History and Physical    Kara Woods:[id number] DOB: 09-Oct-1933 DOA: 10/19/2020  PCP: Celene Squibb, MD   Patient coming from: Home.  I have personally briefly reviewed patient's old medical records in [place]  Chief Complaint: Difficulty finding words and weakness.  HPI: Kara Woods is a 86 y.o. female with medical history significant of anxiety/depression, glaucoma/increase intraocular pressure, hypertension, hyperlipidemia, breast cancer, gastroesophageal reflux disease and chronic back pain; who presented to the emergency department after experiencing acute difficulty finding words and weakness.  Family reports to some babbling speech and unable to understand what the patient was trying to say.  There was no other focal neurologic deficit appreciated at that time.  Patient later expressed feeling generally weak and tired from her baseline.  There was no fever, no chest pain, no nausea, no vomiting, no dysuria, no hematuria, no abdominal pain, no chills, no sick contacts.  Of note, it is exactly unknown what was the last time that the patient was seen normal; but her symptoms were completely resolved by the time of my evaluation.  ED Course: CT scan of the head demonstrated no acute intracranial normalities; patient seen by neurology service and at that time symptoms were already improving/resolving completely.  Based on her underlying history and risk factors recommendations given to admit patient to the hospital to complete a stroke work-up.  Will continue the use of aspirin and Plavix for secondary prevention.  TR[facility] has been contacted to place patient in the hospital to facilitate further evaluation and management.  Review of Systems: As per HPI otherwise all other systems reviewed and are negative.   Past Medical History:  Diagnosis Date  . Anxiety   . Arthritis   . Blind left eye   . Breast cancer ([place])    left   . Colitis   . Coronary artery disease    a.  10/2016 Staged PCI: RCA 50p, 36m(2.25x12 Resolute Onyx DES), LCX 50p, 840m2.75x23 Xience Alpine DES). Residual LAD 50p/m, 4542m  . CMarland Kitchenstocele 10/11/2013  . Depression   . Diastolic dysfunction    a. 09/2016 Echo: EF 50%, diffuse hypokinesis, mild LVH, grade 1 diastolic dysfunction, mild mitral regurgitation, mildly dilated left atrium.  . GMarland KitchenRD (gastroesophageal reflux disease)    occasional Tums only  . HOH (hard of hearing)   . Pelvic relaxation 10/11/2013  . Proctitis    colonsocopy 2007, Canasa suppositories  . S/P endoscopy March 2009   mild erosive reflux esophagitis, Schatzki's ring, s/p dilation  . Schatzki's ring   . Shortness of breath    occ if anxiety  . Wears dentures    upper  . Wears glasses     Past Surgical History:  Procedure Laterality Date  . ABDOMINAL HYSTERECTOMY    . ANTERIOR AND POSTERIOR REPAIR N/A 05/17/2014   Procedure: ANTERIOR (CYSTOCELE) AND POSTERIOR REPAIR (RECTOCELE);  Surgeon: ScoReece PackerD;  Location[facility];  Service: Urology;  Laterality: N/A;  . APPENDECTOMY    . BREAST LUMPECTOMY WITH NEEDLE LOCALIZATION AND AXILLARY SENTINEL LYMPH NODE BX Left 05/03/2013   Procedure: BREAST LUMPECTOMY WITH NEEDLE LOCALIZATION AND AXILLARY SENTINEL LYMPH NODE BX;  Surgeon: BenEdward JollyD;  [facility];  Laterality: Left;  . BREAST SURGERY Left 05/19/13  . CARDIAC CATHETERIZATION  1998  . CARDIAC CATHETERIZATION N/A 11/04/2016   Procedure: Left Heart Cath and Coronary Angiography;  Surgeon: ThoTroy SineD;  Location[facility];  Service: Cardiovascular;  Laterality: N/A;  . CARDIAC CATHETERIZATION N/A 11/05/2016   Procedure: Coronary Stent Intervention;  Surgeon: Troy Sine, MD;  Location: [place] [facility];  Service: Cardiovascular;  Laterality: N/A;  . CHOLECYSTECTOMY    . COLONOSCOPY  05/06/2006   Diffuse inflammatory changes of the rectal mucosa, consistent  with proctitis.   Otherwise, normal colon to terminal ileum  . CORONARY ANGIOPLASTY  11/04/2016  . CORONARY STENT PLACEMENT     Drug-eluting coronary artery stent, non-bioabsorbable-polymer-coated  . CYSTOSCOPY N/A 05/17/2014   Procedure: CYSTOSCOPY;  Surgeon: Reece Packer, MD;  Location: [facility];  Service: Urology;  Laterality: N/A;  . ESOPHAGOGASTRODUODENOSCOPY  02/09/2008   Schatzki ring status post dilation/Distal esophageal erosion consistent with mild erosive reflux  esophagitis, otherwise unremarkable esophagus, normal stomach, D1, D2.  . EYE SURGERY     lt cataract-implant  . EYE SURGERY     lt-lens repaced,l  . [place] SURGERY  1993  . RE-EXCISION OF BREAST LUMPECTOMY Left 05/19/2013   Procedure: RE-EXCISION OF BREAST LUMPECTOMY;  Surgeon: Edward Jolly, MD;  Location: [facility];  Service: General;  Laterality: Left;  . RETINAL DETACHMENT SURGERY  1978   Left  . VAGINAL HYSTERECTOMY Bilateral 05/17/2014   Procedure: HYSTERECTOMY VAGINAL with Bilateral Salpingo-Oophorectomy; Bladder cystotomy repair;  Surgeon: Marvene Staff, MD;  Location: [facility];  Service: Gynecology;  Laterality: Bilateral;  . VAGINAL PROLAPSE REPAIR N/A 05/17/2014   Procedure: VAGINAL VAULT PROLAPSE AND GRAFT;  Surgeon: Reece Packer, MD;  Location: [facility];  Service: Urology;  Laterality: N/A;    Social History  reports that she quit smoking about 30 years ago. Her smoking use included cigarettes. She smoked 1.00 pack per day. She has never used smokeless tobacco. She reports current alcohol use of about 1.0 standard drink of alcohol per week. She reports that she does not use drugs.  Allergies  Allergen Reactions  . Contrast Media [Iodinated Diagnostic Agents] Anaphylaxis    Adverse reaction; pt hyperventilating, c/o CP and SOB   . Lipitor [Atorvastatin] Other (See Comments)    Muscle aches, cramps, fatigue, weight loss/poor appetite  . Sulfamethoxazole Other (See Comments)     Reaction was years ago unknown  . Sulfonamide Derivatives Other (See Comments)    Dizziness     Family History  Problem Relation Age of Onset  . Cancer Brother        spinal  . Heart attack Mother   . Heart attack Father   . Heart attack Son   . Heart attack Son   . Depression Son   . Multiple myeloma Daughter   . Anemia Daughter   . Colon cancer Neg Hx     Prior to Admission medications   Medication Sig Start Date End Date Taking? Authorizing Provider  anastrozole (ARIMIDEX) 1 MG tablet Take 1 tablet (1 mg total) by mouth daily. 08/28/20  Yes Derek Jack, MD  aspirin EC 81 MG tablet Take 1 tablet (81 mg total) by mouth daily with breakfast. 07/24/20  Yes Emokpae, Courage, MD  carboxymethylcellulose (REFRESH PLUS) 0.5 % SOLN Place 3-4 drops into both eyes 3 (three) times daily as needed (dry eyes). Preservative free   Yes [provider]  clopidogrel (PLAVIX) 75 MG tablet Take 75 mg by mouth daily.  12/17/18  Yes [provider]  clorazepate (TRANXENE) 15 MG tablet Take 7.5 mg by mouth daily. May take an additional 7.5 mg if needed  Yes [provider]  escitalopram (LEXAPRO) 10 MG tablet Take 1 tablet (10 mg total) by mouth daily. 09/27/20  Yes Derek Jack, MD  gabapentin (NEURONTIN) 100 MG capsule Take 1 capsule (100 mg total) by mouth 3 (three) times daily. 08/28/20  Yes Derek Jack, MD  HYDROcodone-acetaminophen (NORCO) 5-325 MG tablet Take 1 tablet by mouth every 4 (four) hours as needed for moderate pain. 08/28/20  Yes Derek Jack, MD  latanoprost (XALATAN) 0.005 % ophthalmic solution Place 1 drop into both eyes at bedtime.   Yes [provider]  metoprolol tartrate (LOPRESSOR) 25 MG tablet TAKE 1/2 TABLET BY MOUTH TWICE A DAY Patient taking differently: Take 12.5 mg by mouth 2 (two) times daily.  01/26/20  Yes Troy Sine, MD  nitroGLYCERIN (NITROSTAT) 0.4 MG SL tablet Place 1 tablet (0.4 mg total) under the  tongue every 5 (five) minutes as needed. 11/06/16  Yes Reino Bellis B, NP  ezetimibe (ZETIA) 10 MG tablet Take 10 mg by mouth daily. Patient not taking: Reported on 10/19/2020    [provider]  palbociclib (IBRANCE) 75 MG tablet Take 1 tablet (75 mg total) by mouth daily. Take for 21 days on, 7 days off, repeat every 28 days. Patient not taking: Reported on 10/19/2020 10/13/20   Derek Jack, MD    Physical Exam: Vitals:   10/19/20 1630 10/19/20 1645 10/19/20 1700 10/19/20 1741  BP: (!) 161/106 (!) 173/95 (!) 175/91 (!) 169/89  Pulse: 89 92 88 98  Resp: 16 19 18 17   Temp:      TempSrc:      SpO2: 94% 93% 94% 94%  Weight:        Constitutional: Able to speak in full sentences, reports feeling weak and tired.  No chest pain, no abdominal pain, no nausea or vomiting. Vitals:   10/19/20 1630 10/19/20 1645 10/19/20 1700 10/19/20 1741  BP: (!) 161/106 (!) 173/95 (!) 175/91 (!) 169/89  Pulse: 89 92 88 98  Resp: 16 19 18 17   Temp:      TempSrc:      SpO2: 94% 93% 94% 94%  Weight:       Eyes: PERRL, lids and conjunctivae normal, no icterus, no nystagmus. ENMT: Mucous membranes are moist. Posterior pharynx clear of any exudate or lesions. Neck: normal, supple, no masses, no thyromegaly Respiratory: clear to auscultation bilaterally, no wheezing, no crackles. Normal respiratory effort. No accessory muscle use.  Good oxygen saturation on room air. Cardiovascular: Regular rate and rhythm, no murmurs / rubs / gallops.  Trace extremity edema appreciated bilaterally. 2+ pedal pulses. No carotid bruits.  Abdomen: no tenderness, no masses palpated. No hepatosplenomegaly. Bowel sounds positive.  Musculoskeletal: no clubbing / cyanosis. No joint deformity upper and lower extremities. Good ROM, no contractures. Normal muscle tone.  Skin: no rashes, no petechiae. Neurologic: CN 2-12 grossly intact. Sensation intact, DTR normal. Strength 4/5 in all 4 limbs secondary to poor  effort and generalized weakness.Marland Kitchen  Psychiatric: Normal judgment and insight. Alert and oriented x 3.  Mood is stable.   Labs on Admission: I have personally reviewed following labs and imaging studies  CBC: Recent Labs  Lab 10/19/20 0703 10/19/20 0714  WBC 7.0  --   NEUTROABS 3.8  --   HGB 13.5 13.9  HCT 40.9 41.0  MCV 96.0  --   PLT 319  --     Basic Metabolic Panel: Recent Labs  Lab 10/19/20 0703 10/19/20 0714  NA 137 141  K 4.4  4.4  CL 105 105  CO2 23  --   GLUCOSE 100* 101*  BUN 8 6*  CREATININE 0.57 0.60  CALCIUM 9.0  --     GFR: Estimated Creatinine Clearance: 52.8 mL/min (by C-G formula based on SCr of 0.6 mg/dL).  Liver Function Tests: Recent Labs  Lab 10/19/20 0703  AST 16  ALT 11  ALKPHOS 61  BILITOT 0.6  PROT 7.7  ALBUMIN 4.3    Urine analysis:    Component Value Date/Time   COLORURINE STRAW (A) 10/19/2020 0653   APPEARANCEUR CLEAR 10/19/2020 0653   LABSPEC 1.004 (L) 10/19/2020 0653   PHURINE 8.0 10/19/2020 0653   GLUCOSEU NEGATIVE 10/19/2020 0653   HGBUR SMALL (A) 10/19/2020 0653   BILIRUBINUR NEGATIVE 10/19/2020 0653   [place] 10/19/2020 0653   PROTEINUR NEGATIVE 10/19/2020 0653   UROBILINOGEN 0.2 05/28/2014 1147   NITRITE NEGATIVE 10/19/2020 0653   LEUKOCYTESUR NEGATIVE 10/19/2020 0653    Radiological Exams on Admission: US Carotid Bilateral (at [facility] and [facility] only)  Result Date: 10/19/2020 CLINICAL DATA:  86 year old female with history of TIA EXAM: BILATERAL CAROTID DUPLEX ULTRASOUND TECHNIQUE: Pearline Cables scale imaging, color Doppler and duplex ultrasound were performed of bilateral carotid and vertebral arteries in the neck. COMPARISON:  None. FINDINGS: Criteria: Quantification of carotid stenosis is based on velocity parameters that correlate the residual internal carotid diameter with NASCET-based stenosis levels, using the diameter of the distal internal carotid lumen as the denominator for stenosis measurement. The following  velocity measurements were obtained: RIGHT ICA: Peak systolic velocity 67 cm/sec, End diastolic velocity 16 cm/sec CCA: Peak systolic velocity 63 cm/sec SYSTOLIC ICA/CCA RATIO:  1.1 ECA: Peak systolic velocity 500 cm/sec LEFT ICA: Peak systolic velocity 71 cm/sec, End diastolic velocity 23 cm/sec CCA: 83 cm/sec SYSTOLIC ICA/CCA RATIO:  0.9 ECA: 61 cm/sec RIGHT CAROTID ARTERY: Mild multifocal, circumferential atherosclerotic plaque formation. No significant tortuosity. Normal low resistance waveforms. RIGHT VERTEBRAL ARTERY:  Antegrade flow. LEFT CAROTID ARTERY: Mild multifocal, circumferential atherosclerotic plaque formation. No significant tortuosity. Normal low resistance waveforms. LEFT VERTEBRAL ARTERY:  Antegrade flow. Upper extremity non-invasive blood pressures: Not obtained. IMPRESSION: 1. Right carotid artery system: Less than 50% stenosis secondary to mild multifocal, circumferential atherosclerotic plaque formation. 2. Left carotid artery system: Less than 50% stenosis secondary to mild multifocal, circumferential atherosclerotic plaque formation. 3.  Vertebral artery system: Patent with antegrade flow bilaterally. Ruthann Cancer, MD [facility] Radiology Electronically Signed   By: Ruthann Cancer MD   On: 10/19/2020 14:27   DG Chest Port 1 View  Result Date: 10/19/2020 CLINICAL DATA:  Weakness.  History of breast cancer. EXAM: PORTABLE CHEST 1 VIEW COMPARISON:  Chest radiograph 02/21/2020. FINDINGS: Monitoring leads overlie the patient. Stable cardiac and mediastinal contours. Minimal heterogeneous opacities bilateral lung bases may represent atelectasis. Opacity within the right upper hemithorax compatible with destructive osseous metastasis and associated soft tissue lesions. Additionally there is progressed fracturing of the left second, fifth and seventh ribs, most compatible with pathologic fractures. Known thoracic osseous metastasis not well  visualized on current portable exam. IMPRESSION: 1. Probable pathologic fractures of the left second, fifth and seventh ribs. 2. 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 3. Left greater than right basilar atelectasis. Electronically Signed   By: Lovey Newcomer M.D.   On: 10/19/2020 08:27   ECHOCARDIOGRAM COMPLETE  Result Date: 10/19/2020    ECHOCARDIOGRAM REPORT   Patient Name:   Kara Woods Date of Exam: 10/19/2020 Medical Rec #:  [id number]        Height:       66.0 in Accession #:    [id number]       Weight:       162.6 lb Date of Birth:  1932-12-17        BSA:          1.831 m Patient Age:    89 years         BP:           166/102 mmHg Patient Gender: F                HR:           119 bpm. Exam Location:  [facility] Procedure: 2D Echo Indications:    TIA 435.9 / G45.9  History:        Patient has prior history of Echocardiogram examinations, most                 recent 07/24/2020. CAD, TIA, Signs/Symptoms:Chest Pain; Risk                 Factors:Former Smoker, Dyslipidemia and Hypertension. Breast                 Cancer.  Sonographer:    Leavy Cella RDCS (AE) Referring Phys: [place]  1. Left ventricular ejection fraction, by estimation, is 60 to 65%. The left ventricle has normal function. The left ventricle has no regional wall motion abnormalities. Left ventricular diastolic parameters are consistent with Grade I diastolic dysfunction (impaired relaxation).  2. Right ventricular systolic function is normal. The right ventricular size is normal. There is normal pulmonary artery systolic pressure. The estimated right ventricular systolic pressure is 94.8 mmHg.  3. The mitral valve is normal in structure. Trivial mitral valve regurgitation. No evidence of mitral stenosis.  4. The aortic valve is normal in structure. Aortic valve regurgitation is not visualized. No aortic stenosis is present.  5. The inferior vena cava is normal in size with greater than  50% respiratory variability, suggesting right atrial pressure of 3 mmHg. Comparison(s): No significant change from prior study. Prior images reviewed side by side. FINDINGS  Left Ventricle: Left ventricular ejection fraction, by estimation, is 60 to 65%. The left ventricle has normal function. The left ventricle has no regional wall motion abnormalities. The left ventricular internal cavity size was normal in size. There is  no left ventricular hypertrophy. Left ventricular diastolic parameters are consistent with Grade I diastolic dysfunction (impaired relaxation). Right Ventricle: The right ventricular size is normal. No increase in right ventricular wall thickness. Right ventricular systolic function is normal. There is normal pulmonary artery systolic pressure. The tricuspid regurgitant velocity is 2.58 m/s, and  with an assumed right atrial pressure of 3 mmHg, the estimated right ventricular systolic pressure is 01.6 mmHg. Left Atrium: Left atrial size was normal in size. Right Atrium: Right atrial size was normal in size. Pericardium: There is no evidence of pericardial effusion. Mitral Valve: The mitral valve is normal in structure. Trivial mitral valve regurgitation. No evidence of mitral valve stenosis. Tricuspid Valve: The tricuspid valve is normal in structure. Tricuspid valve regurgitation is trivial. No evidence of tricuspid stenosis. Aortic Valve: The aortic valve is normal in structure. Aortic valve regurgitation is not visualized. No aortic stenosis is present. Pulmonic Valve: The pulmonic valve was normal in structure. Pulmonic valve regurgitation is not visualized. No evidence of pulmonic stenosis. Aorta: The aortic root is normal  in size and structure. Venous: The inferior vena cava is normal in size with greater than 50% respiratory variability, suggesting right atrial pressure of 3 mmHg. IAS/Shunts: No atrial level shunt detected by color flow Doppler.  LEFT VENTRICLE PLAX 2D LVIDd:         3.84  cm  Diastology LVIDs:         2.76 cm  LV e' lateral:   5.55 cm/s LV PW:         1.34 cm  LV E/e' lateral: 9.4 LV IVS:        1.07 cm LVOT diam:     1.90 cm LVOT Area:     2.84 cm  RIGHT VENTRICLE RV S prime:     8.27 cm/s TAPSE (M-mode): 1.5 cm LEFT ATRIUM             Index       RIGHT ATRIUM          Index LA diam:        2.80 cm 1.53 cm/m  RA Area:     9.57 cm LA Vol (A2C):   23.2 ml 12.67 ml/m RA Volume:   16.60 ml 9.06 ml/m LA Vol (A4C):   53.9 ml 29.43 ml/m LA Biplane Vol: 37.1 ml 20.26 ml/m   AORTA Ao Root diam: 3.00 cm MITRAL VALVE               TRICUSPID VALVE MV Area (PHT): 4.01 cm    TR Peak grad:   26.6 mmHg MV Decel Time: 189 msec    TR Vmax:        258.00 cm/s MV E velocity: 52.30 cm/s MV A velocity: 72.40 cm/s  SHUNTS MV E/A ratio:  0.72        Systemic Diam: 1.90 cm Candee Furbish MD Electronically signed by Candee Furbish MD Signature Date/Time: 10/19/2020/3:28:18 PM    Final    CT HEAD CODE STROKE WO CONTRAST  Result Date: 10/19/2020 CLINICAL DATA:  Code stroke.  Neuro deficit, acute, stroke suspected EXAM: CT HEAD WITHOUT CONTRAST TECHNIQUE: Contiguous axial images were obtained from the base of the skull through the vertex without intravenous contrast. COMPARISON:  07/24/2020 MRI head and prior. 07/24/2020 head CT and prior. FINDINGS: Brain: No acute infarct or intracranial hemorrhage. No mass lesion. No midline shift, ventriculomegaly or extra-axial fluid collection. Mild cerebral atrophy with ex vacuo dilatation. Chronic microvascular ischemic changes. Vascular: No hyperdense vessel or unexpected calcification. Bilateral skull base atherosclerotic calcifications. Skull: Negative for fracture or focal lesion. Sinuses/Orbits: No acute finding. Pneumatized paranasal sinuses and mastoid air cells. Other: None. ASPECTS [facility] Stroke Program Early CT Score) - Ganglionic level infarction (caudate, lentiform nuclei, internal capsule, insula, M1-M3 cortex): 7 - Supraganglionic infarction (M4-M6  cortex): 3 Total score (0-10 with 10 being normal): 10 IMPRESSION: 1. No evidence of acute intracranial abnormality. 2. ASPECTS is 10. 3. Mild cerebral atrophy and chronic microvascular ischemic changes. Code stroke imaging results were communicated on 10/19/2020 at 7:14 am to provider Dr. Tomi Bamberger Via telephone, who verbally acknowledged these results. Electronically Signed   By: Primitivo Gauze M.D.   On: 10/19/2020 07:16    EKG: Independently reviewed.  No acute ischemic changes; sinus rhythm.  Assessment/Plan 1-strokelike symptoms and/TIA (transient ischemic attack) -Patient symptoms subsided/resolved while in the ED -After discussing with neurology service decision has been made to place in the hospital and complete a stroke work-up -Continue aspirin and Plavix for secondary prevention -Will check A1c, lipid panel, TSH,  B12, vitamin D, 2D echo, carotid Dopplers and MRI/MRA. -Continue Zetia. -Physical therapy, Occupational Therapy and speech therapy was seen patient as part of a stroke work-up order set. -Monitor on telemetry and follow neurologic examination for the next 24 hours.  2-hypertension -Continue Lopressor -Blood pressure stable and well-controlled -Will be permissive with hypertension in the setting of acute ischemic concerns.    3-gastroesophageal flux disease -Continue Protonix  4-history of breast cancer -Continue patient follow-up with oncology service -Continue Arimidex  5-depression/anxiety -Continue the use of Lexapro and Tranxene -No suicidal ideation or hallucinations currently.  6-hyperlipidemia -Continue Zetia -Follow lipid panel.  7-history of neuropathy -Continue Neurontin.  8-history of increased intraocular pressure/glaucoma -Continue latanoprost eyedrop   DVT prophylaxis: Heparin Code Status:   Full code. Family Communication:  Daughter at bedside. Disposition Plan:   Patient is from:  Home   Anticipated DC to:  To be determined    Anticipated DC date:  10/20/2020  Anticipated DC barriers: Completion of stroke work-up.  Consults called:  Neurology Admission status:  Observation status, telemetry bed, length of stay less than 2 midnights.  Severity of Illness: Mild severity; patient presenting with strokelike symptoms that subsided/resolved while in the ED.  Neurology service has seen patient and recommended admission for acute stroke work-up completion.  CT head negative for acute bleeding.    Barton Dubois MD [facility]  How to contact the [facility] Attending or Consulting provider [place] or covering provider during after hours [place], for this patient?   1. Check the care team in [facility] and look for a) attending/consulting [facility] provider listed and b) the [facility] team listed 2. Log into [email or web address] and use [place]'s universal password to access. If you do not have the password, please contact the hospital operator. 3. Locate the [facility] provider you are looking for under [facility] and page to a number that you can be directly reached. 4. If you still have difficulty reaching the provider, please page the [facility] (Director on Call) for the Hospitalists listed on amion for assistance.  10/19/2020, 6:13 PM

## 2020-10-19 NOTE — ED Provider Notes (Signed)
[facility] EMERGENCY DEPARTMENT Provider Note   CSN: [id number] Arrival date & time: 10/19/20  8127  An emergency department physician performed an initial assessment on this suspected stroke patient at [phone].  History Chief Complaint  Patient presents with  . Code Stroke    Kara Woods is a 86 y.o. female.  Patient woke up this morning and was having babbling speech. This supposedly happened to her 1 time before when she had a TIA. By the time the neurologist saw her patient was able to speak but initially when Dr. Tomi Bamberger saw her she could not speak  The history is provided by the EMS personnel and a significant other.  Weakness Severity:  Mild Onset quality:  Sudden Timing:  Intermittent Chronicity:  Recurrent Context: not alcohol use   Relieved by:  Nothing Ineffective treatments:  None tried      Past Medical History:  Diagnosis Date  . Anxiety   . Arthritis   . Blind left eye   . Breast cancer ([place])    left   . Colitis   . Coronary artery disease    a. 10/2016 Staged PCI: RCA 50p, 45m(2.25x12 Resolute Onyx DES), LCX 50p, 8102m2.75x23 Xience Alpine DES). Residual LAD 50p/m, 4559m  . CMarland Kitchenstocele 10/11/2013  . Depression   . Diastolic dysfunction    a. 09/2016 Echo: EF 50%, diffuse hypokinesis, mild LVH, grade 1 diastolic dysfunction, mild mitral regurgitation, mildly dilated left atrium.  . GMarland KitchenRD (gastroesophageal reflux disease)    occasional Tums only  . HOH (hard of hearing)   . Pelvic relaxation 10/11/2013  . Proctitis    colonsocopy 2007, Canasa suppositories  . S/P endoscopy March 2009   mild erosive reflux esophagitis, Schatzki's ring, s/p dilation  . Schatzki's ring   . Shortness of breath    occ if anxiety  . Wears dentures    upper  . Wears glasses     Patient Active Problem List   Diagnosis Date Noted  . TIA (transient ischemic attack) 07/24/2020  . Dizziness 11/19/2016  . CAD S/P percutaneous coronary angioplasty 11/06/2016  .  Essential hypertension 11/06/2016  . Unstable angina ([place]/09/2016  . S/P vaginal hysterectomy 05/17/2014  . Cystocele 10/11/2013  . Pelvic relaxation 10/11/2013  . History of breast cancer 04/20/2013  . After cataract, bilateral 04/07/2013  . Corneal edema 04/07/2013  . Dry eyes 04/07/2013  . Diarrhea 06/23/2012  . Schatzki's ring 07/16/2011  . COLLES' FRACTURE, LEFT 04/04/2010  . Chest pain in adult 09/18/2009  . Dyslipidemia, goal LDL below 70 07/28/2008  . ANXIETY 07/28/2008  . MACULAR DEGENERATION, BILATERAL 07/28/2008  . GERD 07/28/2008  . PROCTITIS 07/28/2008  . OSTEOPENIA 07/28/2008  . RETINAL DETACHMENT, LEFT EYE, HX OF 07/28/2008    Past Surgical History:  Procedure Laterality Date  . ABDOMINAL HYSTERECTOMY    . ANTERIOR AND POSTERIOR REPAIR N/A 05/17/2014   Procedure: ANTERIOR (CYSTOCELE) AND POSTERIOR REPAIR (RECTOCELE);  Surgeon: ScoReece PackerD;  [facility];  Service: Urology;  Laterality: N/A;  . APPENDECTOMY    . BREAST LUMPECTOMY WITH NEEDLE LOCALIZATION AND AXILLARY SENTINEL LYMPH NODE BX Left 05/03/2013   Procedure: BREAST LUMPECTOMY WITH NEEDLE LOCALIZATION AND AXILLARY SENTINEL LYMPH NODE BX;  Surgeon: BenEdward JollyD;  [facility];  Laterality: Left;  . BREAST SURGERY Left 05/19/13  . CARDIAC CATHETERIZATION  1998  . CARDIAC CATHETERIZATION N/A 11/04/2016   Procedure: Left Heart Cath and Coronary Angiography;  Surgeon: Troy Sine, MD;  Location: [place] [facility];  Service: Cardiovascular;  Laterality: N/A;  . CARDIAC CATHETERIZATION N/A 11/05/2016   Procedure: Coronary Stent Intervention;  Surgeon: Troy Sine, MD;  Location: [place] [facility];  Service: Cardiovascular;  Laterality: N/A;  . CHOLECYSTECTOMY    . COLONOSCOPY  05/06/2006   Diffuse inflammatory changes of the rectal mucosa, consistent  with proctitis.  Otherwise, normal colon to terminal ileum  . CORONARY ANGIOPLASTY   11/04/2016  . CORONARY STENT PLACEMENT     Drug-eluting coronary artery stent, non-bioabsorbable-polymer-coated  . CYSTOSCOPY N/A 05/17/2014   Procedure: CYSTOSCOPY;  Surgeon: Reece Packer, MD;  Location: [place] [facility];  Service: Urology;  Laterality: N/A;  . ESOPHAGOGASTRODUODENOSCOPY  02/09/2008   Schatzki ring status post dilation/Distal esophageal erosion consistent with mild erosive reflux  esophagitis, otherwise unremarkable esophagus, normal stomach, D1, D2.  . EYE SURGERY     lt cataract-implant  . EYE SURGERY     lt-lens repaced,l  . [place] SURGERY  1993  . RE-EXCISION OF BREAST LUMPECTOMY Left 05/19/2013   Procedure: RE-EXCISION OF BREAST LUMPECTOMY;  Surgeon: Edward Jolly, MD;  Location: [place];  Service: General;  Laterality: Left;  . RETINAL DETACHMENT SURGERY  1978   Left  . VAGINAL HYSTERECTOMY Bilateral 05/17/2014   Procedure: HYSTERECTOMY VAGINAL with Bilateral Salpingo-Oophorectomy; Bladder cystotomy repair;  Surgeon: Marvene Staff, MD;  Location: [facility];  Service: Gynecology;  Laterality: Bilateral;  . VAGINAL PROLAPSE REPAIR N/A 05/17/2014   Procedure: VAGINAL VAULT PROLAPSE AND GRAFT;  Surgeon: Reece Packer, MD;  Location: [facility];  Service: Urology;  Laterality: N/A;     OB History    Gravida  6   Para  6   Term      Preterm      AB      Living  5     SAB      TAB      Ectopic      Multiple      Live Births  6           Family History  Problem Relation Age of Onset  . Cancer Brother        spinal  . Heart attack Mother   . Heart attack Father   . Heart attack Son   . Heart attack Son   . Depression Son   . Multiple myeloma Daughter   . Anemia Daughter   . Colon cancer Neg Hx     Social History   Tobacco Use  . Smoking status: Former Smoker    Packs/day: 1.00    Types: Cigarettes    Quit date: 04/28/1990    Years since quitting: 30.4  . Smokeless tobacco: Never Used  . Tobacco comment:  quit 28 yrs ago  Substance Use Topics  . Alcohol use: Yes    Alcohol/week: 1.0 standard drink    Types: 1 Glasses of wine per week    Comment: occ  . Drug use: No    Home Medications Prior to Admission medications   Medication Sig Start Date End Date Taking? Authorizing Provider  anastrozole (ARIMIDEX) 1 MG tablet Take 1 tablet (1 mg total) by mouth daily. 08/28/20   Derek Jack, MD  aspirin EC 81 MG tablet Take 1 tablet (81 mg total) by mouth daily with breakfast. 07/24/20   Emokpae, Courage, MD  carboxymethylcellulose (REFRESH PLUS) 0.5 % SOLN Place 3-4 drops into both eyes  3 (three) times daily as needed (dry eyes). Preservative free    [provider]  clopidogrel (PLAVIX) 75 MG tablet Take 75 mg by mouth daily.  12/17/18   [provider]  clorazepate (TRANXENE) 15 MG tablet Take 7.5 mg by mouth daily. May take an additional 7.5 mg if needed    [provider]  escitalopram (LEXAPRO) 10 MG tablet Take 1 tablet (10 mg total) by mouth daily. 09/27/20   Derek Jack, MD  ezetimibe (ZETIA) 10 MG tablet Take 10 mg by mouth daily.    [provider]  gabapentin (NEURONTIN) 100 MG capsule Take 1 capsule (100 mg total) by mouth 3 (three) times daily. 08/28/20   Derek Jack, MD  HYDROcodone-acetaminophen (NORCO) 5-325 MG tablet Take 1 tablet by mouth every 4 (four) hours as needed for moderate pain. 08/28/20   Derek Jack, MD  latanoprost (XALATAN) 0.005 % ophthalmic solution Place 1 drop into both eyes at bedtime.    [provider]  metoprolol tartrate (LOPRESSOR) 25 MG tablet TAKE 1/2 TABLET BY MOUTH TWICE A DAY Patient taking differently: Take 12.5 mg by mouth 2 (two) times daily.  01/26/20   Troy Sine, MD  nitroGLYCERIN (NITROSTAT) 0.4 MG SL tablet Place 1 tablet (0.4 mg total) under the tongue every 5 (five) minutes as needed. 11/06/16   Cheryln Manly, NP  palbociclib (IBRANCE) 75 MG tablet Take 1 tablet (75  mg total) by mouth daily. Take for 21 days on, 7 days off, repeat every 28 days. 10/13/20   Derek Jack, MD    Allergies    Contrast media [iodinated diagnostic agents], Lipitor [atorvastatin], Sulfamethoxazole, and Sulfonamide derivatives  Review of Systems   Review of Systems  Unable to perform ROS: Mental status change    Physical Exam Updated Vital Signs BP (!) 181/105   Pulse (!) 129   Temp (!) 97.4 F (36.3 C) (Oral)   Resp 19   Wt 73.8 kg   SpO2 96%   BMI 26.24 kg/m   Physical Exam Vitals reviewed.  Constitutional:      Appearance: Normal appearance. She is well-developed.  HENT:     Head: Normocephalic.     Nose: Nose normal.  Eyes:     General: No scleral icterus.    Conjunctiva/sclera: Conjunctivae normal.  Neck:     Thyroid: No thyromegaly.  Cardiovascular:     Rate and Rhythm: Normal rate and regular rhythm.     Heart sounds: No murmur heard.  No friction rub. No gallop.   Pulmonary:     Breath sounds: No stridor. No wheezing or rales.  Chest:     Chest wall: No tenderness.  Abdominal:     General: There is no distension.     Tenderness: There is no abdominal tenderness. There is no rebound.  Musculoskeletal:        General: Normal range of motion.     Cervical back: Neck supple.  Lymphadenopathy:     Cervical: No cervical adenopathy.  Skin:    Findings: No erythema or rash.  Neurological:     Mental Status: She is alert and oriented to person, place, and time.     Motor: No abnormal muscle tone.     Coordination: Coordination normal.     Comments: Patient with some stuttering speech  Psychiatric:        Behavior: Behavior normal.     ED Results / Procedures / Treatments   Labs (all labs ordered are  listed, but only abnormal results are displayed) Labs Reviewed  CBC - Abnormal; Notable for the following components:      Result Value   RDW 17.3 (*)    All other components within normal limits  COMPREHENSIVE METABOLIC PANEL -  Abnormal; Notable for the following components:   Glucose, Bld 100 (*)    All other components within normal limits  I-STAT CHEM 8, ED - Abnormal; Notable for the following components:   BUN 6 (*)    Glucose, Bld 101 (*)    Calcium, Ion 1.07 (*)    All other components within normal limits  RESP PANEL BY RT-PCR (FLU A&B, COVID) ARPGX2  ETHANOL  PROTIME-INR  APTT  DIFFERENTIAL  RAPID URINE DRUG SCREEN, HOSP PERFORMED  URINALYSIS, ROUTINE W REFLEX MICROSCOPIC  CBG MONITORING, ED    EKG None  Radiology DG Chest Port 1 View  Result Date: 10/19/2020 CLINICAL DATA:  Weakness.  History of breast cancer. EXAM: PORTABLE CHEST 1 VIEW COMPARISON:  Chest radiograph 02/21/2020. FINDINGS: Monitoring leads overlie the patient. Stable cardiac and mediastinal contours. Minimal heterogeneous opacities bilateral lung bases may represent atelectasis. Opacity within the right upper hemithorax compatible with destructive osseous metastasis and associated soft tissue lesions. Additionally there is progressed fracturing of the left second, fifth and seventh ribs, most compatible with pathologic fractures. Known thoracic osseous metastasis not well visualized on current portable exam. IMPRESSION: 1. Probable pathologic fractures of the left second, fifth and seventh ribs. 2. 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 3. Left greater than right basilar atelectasis. Electronically Signed   By: Lovey Newcomer M.D.   On: 10/19/2020 08:27   CT HEAD CODE STROKE WO CONTRAST  Result Date: 10/19/2020 CLINICAL DATA:  Code stroke.  Neuro deficit, acute, stroke suspected EXAM: CT HEAD WITHOUT CONTRAST TECHNIQUE: Contiguous axial images were obtained from the base of the skull through the vertex without intravenous contrast. COMPARISON:  07/24/2020 MRI head and prior. 07/24/2020 head CT and prior. FINDINGS: Brain: No acute infarct or intracranial hemorrhage. No mass lesion. No midline shift,  ventriculomegaly or extra-axial fluid collection. Mild cerebral atrophy with ex vacuo dilatation. Chronic microvascular ischemic changes. Vascular: No hyperdense vessel or unexpected calcification. Bilateral skull base atherosclerotic calcifications. Skull: Negative for fracture or focal lesion. Sinuses/Orbits: No acute finding. Pneumatized paranasal sinuses and mastoid air cells. Other: None. ASPECTS [facility] Stroke Program Early CT Score) - Ganglionic level infarction (caudate, lentiform nuclei, internal capsule, insula, M1-M3 cortex): 7 - Supraganglionic infarction (M4-M6 cortex): 3 Total score (0-10 with 10 being normal): 10 IMPRESSION: 1. No evidence of acute intracranial abnormality. 2. ASPECTS is 10. 3. Mild cerebral atrophy and chronic microvascular ischemic changes. Code stroke imaging results were communicated on 10/19/2020 at 7:14 am to provider Dr. Tomi Bamberger Via telephone, who verbally acknowledged these results. Electronically Signed   By: Primitivo Gauze M.D.   On: 10/19/2020 07:16    Procedures Procedures (including critical care time)  Medications Ordered in ED Medications  HYDROcodone-acetaminophen (NORCO/VICODIN) 5-325 MG per tablet 1 tablet (1 tablet Oral Given 10/19/20 0815)    ED Course  I have reviewed the triage vital signs and the nursing notes.  Pertinent labs & imaging results that were available during my care of the patient were reviewed by me and considered in my medical decision making (see chart for details).    MDM Rules/Calculators/A&P  Patient with stuttering speech. She was seen by neurology who feels like she needs further stroke work-up in the hospital and continue Plavix and aspirin.    MRI not available today but we do have MRI on Friday at any [facility] hospital Final Clinical Impression(s) / ED Diagnoses Final diagnoses:  None    Rx / DC Orders ED Discharge Orders    None       Milton Ferguson, MD 10/19/20 [phone]

## 2020-10-19 NOTE — ED Notes (Signed)
ED TO INPATIENT HANDOFF REPORT  ED Nurse Name and Phone #: Angelica Pou [phone]  S Name/Age/Gender Kara Woods 86 y.o. female Room/Bed: APA02/APA02  Code Status   Code Status: Full Code  Home/SNF/Other Home Patient oriented to: self, place, time and situation Is this baseline? Yes   Triage Complete: Triage complete  Chief Complaint TIA (transient ischemic attack) [G45.9]  Triage Note Per daughter, family heard pt "babbling" at 0610 this morning. Went to check on pt and pt could not talk.  Called EMS.  Pt "babbling" with MD.  Martin Majestic to CT. Pt able to complete sentences.       Allergies Allergies  Allergen Reactions  . Contrast Media [Iodinated Diagnostic Agents] Anaphylaxis    Adverse reaction; pt hyperventilating, c/o CP and SOB   . Lipitor [Atorvastatin] Other (See Comments)    Muscle aches, cramps, fatigue, weight loss/poor appetite  . Sulfamethoxazole Other (See Comments)    Reaction was years ago unknown  . Sulfonamide Derivatives Other (See Comments)    Dizziness     Level of Care/Admitting Diagnosis ED Disposition    ED Disposition Condition Comment   Admit  Hospital Area: [facility] [[id number]]  Level of Care: Telemetry [5]  Covid Evaluation: Asymptomatic Screening Protocol (No Symptoms)  Diagnosis: TIA (transient ischemic attack) [130865]  Admitting Physician: [place], [place]  Attending Physician: Barton Dubois [[id number]]       B Medical/Surgery History Past Medical History:  Diagnosis Date  . Anxiety   . Arthritis   . Blind left eye   . Breast cancer ([place])    left   . Colitis   . Coronary artery disease    a. 10/2016 Staged PCI: RCA 50p, 49m (2.25x12 Resolute Onyx DES), LCX 50p, 54m (2.75x23 Xience Alpine DES). Residual LAD 50p/m, 75m/d.  Marland Kitchen Cystocele 10/11/2013  . Depression   . Diastolic dysfunction    a. 09/2016 Echo: EF 50%, diffuse hypokinesis, mild LVH, grade 1 diastolic dysfunction, mild mitral regurgitation, mildly  dilated left atrium.  Marland Kitchen GERD (gastroesophageal reflux disease)    occasional Tums only  . HOH (hard of hearing)   . Pelvic relaxation 10/11/2013  . Proctitis    colonsocopy 2007, Canasa suppositories  . S/P endoscopy March 2009   mild erosive reflux esophagitis, Schatzki's ring, s/p dilation  . Schatzki's ring   . Shortness of breath    occ if anxiety  . Wears dentures    upper  . Wears glasses    Past Surgical History:  Procedure Laterality Date  . ABDOMINAL HYSTERECTOMY    . ANTERIOR AND POSTERIOR REPAIR N/A 05/17/2014   Procedure: ANTERIOR (CYSTOCELE) AND POSTERIOR REPAIR (RECTOCELE);  Surgeon: Reece Packer, MD;  Location: [facility];  Service: Urology;  Laterality: N/A;  . APPENDECTOMY    . BREAST LUMPECTOMY WITH NEEDLE LOCALIZATION AND AXILLARY SENTINEL LYMPH NODE BX Left 05/03/2013   Procedure: BREAST LUMPECTOMY WITH NEEDLE LOCALIZATION AND AXILLARY SENTINEL LYMPH NODE BX;  Surgeon: Edward Jolly, MD;  Location: [facility];  Service: General;  Laterality: Left;  . BREAST SURGERY Left 05/19/13  . CARDIAC CATHETERIZATION  1998  . CARDIAC CATHETERIZATION N/A 11/04/2016   Procedure: Left Heart Cath and Coronary Angiography;  Surgeon: Troy Sine, MD;  Location: [facility];  Service: Cardiovascular;  Laterality: N/A;  . CARDIAC CATHETERIZATION N/A 11/05/2016   Procedure: Coronary Stent Intervention;  Surgeon: Troy Sine, MD;  Location: [facility];  Service: Cardiovascular;  Laterality: N/A;  .  CHOLECYSTECTOMY    . COLONOSCOPY  05/06/2006   Diffuse inflammatory changes of the rectal mucosa, consistent  with proctitis.  Otherwise, normal colon to terminal ileum  . CORONARY ANGIOPLASTY  11/04/2016  . CORONARY STENT PLACEMENT     Drug-eluting coronary artery stent, non-bioabsorbable-polymer-coated  . CYSTOSCOPY N/A 05/17/2014   Procedure: CYSTOSCOPY;  Surgeon: Reece Packer, MD;  Location: [facility];  Service: Urology;  Laterality: N/A;  .  ESOPHAGOGASTRODUODENOSCOPY  02/09/2008   Schatzki ring status post dilation/Distal esophageal erosion consistent with mild erosive reflux  esophagitis, otherwise unremarkable esophagus, normal stomach, D1, D2.  . EYE SURGERY     lt cataract-implant  . EYE SURGERY     lt-lens repaced,l  . [place] SURGERY  1993  . RE-EXCISION OF BREAST LUMPECTOMY Left 05/19/2013   Procedure: RE-EXCISION OF BREAST LUMPECTOMY;  Surgeon: Edward Jolly, MD;  Location: [facility];  Service: General;  Laterality: Left;  . RETINAL DETACHMENT SURGERY  1978   Left  . VAGINAL HYSTERECTOMY Bilateral 05/17/2014   Procedure: HYSTERECTOMY VAGINAL with Bilateral Salpingo-Oophorectomy; Bladder cystotomy repair;  Surgeon: Marvene Staff, MD;  Location: [facility];  Service: Gynecology;  Laterality: Bilateral;  . VAGINAL PROLAPSE REPAIR N/A 05/17/2014   Procedure: VAGINAL VAULT PROLAPSE AND GRAFT;  Surgeon: Reece Packer, MD;  Location: [facility];  Service: Urology;  Laterality: N/A;     A IV Location/Drains/Wounds Patient Lines/Drains/Airways Status    Active Line/Drains/Airways    Name Placement date Placement time Site Days   Peripheral IV 10/19/20 Left Antecubital 10/19/20  0800  Antecubital  less than 1   External Urinary Catheter 10/19/20  0717  --  less than 1   Incision 05/03/13 Breast Left 05/03/13  1326   2726   Incision 05/03/13 Axilla Left 05/03/13  1326   2726   Incision 05/19/13 Breast Left 05/19/13  1144   2710   Incision (Closed) 05/17/14 Perineum Other (Comment) 05/17/14  0821   2347   Incision (Closed) 08/22/20 Back Right;Upper 08/22/20  1156   58          Intake/Output Last 24 hours No intake or output data in the 24 hours ending 10/19/20 1640  Labs/Imaging Results for orders placed or performed during the hospital encounter of 10/19/20 (from the past 48 hour(s))  Urine rapid drug screen (hosp performed)     Status: Abnormal   Collection Time: 10/19/20  6:53 AM   Result Value Ref Range   Opiates POSITIVE (A) NONE DETECTED   Cocaine NONE DETECTED NONE DETECTED   Benzodiazepines POSITIVE (A) NONE DETECTED   Amphetamines NONE DETECTED NONE DETECTED   Tetrahydrocannabinol NONE DETECTED NONE DETECTED   Barbiturates NONE DETECTED NONE DETECTED    Comment: (NOTE) DRUG SCREEN FOR MEDICAL PURPOSES ONLY.  IF CONFIRMATION IS NEEDED FOR ANY PURPOSE, NOTIFY LAB WITHIN 5 DAYS.  LOWEST DETECTABLE LIMITS FOR URINE DRUG SCREEN Drug Class                     Cutoff (ng/mL) Amphetamine and metabolites    1000 Barbiturate and metabolites    200 Benzodiazepine                 678 Tricyclics and metabolites     300 Opiates and metabolites        300 Cocaine and metabolites        300 THC  50 Performed at [facility], [address], [place], [place] [address]   Urinalysis, Routine w reflex microscopic Urine, Clean Catch     Status: Abnormal   Collection Time: 10/19/20  6:53 AM  Result Value Ref Range   Color, Urine STRAW (A) YELLOW   APPearance CLEAR CLEAR   Specific Gravity, Urine 1.004 (L) 1.005 - 1.030   pH 8.0 5.0 - 8.0   Glucose, UA NEGATIVE NEGATIVE mg/dL   Hgb urine dipstick SMALL (A) NEGATIVE   Bilirubin Urine NEGATIVE NEGATIVE   Ketones, ur NEGATIVE NEGATIVE mg/dL   Protein, ur NEGATIVE NEGATIVE mg/dL   Nitrite NEGATIVE NEGATIVE   Leukocytes,Ua NEGATIVE NEGATIVE   RBC / HPF 0-5 0 - 5 RBC/hpf   WBC, UA 0-5 0 - 5 WBC/hpf   Bacteria, UA NONE SEEN NONE SEEN    Comment: Performed at [facility], [address], [place], [place] [address]  Ethanol     Status: None   Collection Time: 10/19/20  7:03 AM  Result Value Ref Range   Alcohol, Ethyl (B) <10 <10 mg/dL    Comment: (NOTE) Lowest detectable limit for serum alcohol is 10 mg/dL.  For medical purposes only. Performed at [facility], [address] [place], [place] [address]   Protime-INR     Status: None   Collection Time: 10/19/20  7:03 AM  Result Value Ref  Range   Prothrombin Time 12.0 11.4 - 15.2 seconds   INR 0.9 0.8 - 1.2    Comment: (NOTE) INR goal varies based on device and disease states. Performed at [facility], [address] [place], [place] [address]   APTT     Status: None   Collection Time: 10/19/20  7:03 AM  Result Value Ref Range   aPTT 28 24 - 36 seconds    Comment: Performed at [facility], [address], [place], [place] [address]  CBC     Status: Abnormal   Collection Time: 10/19/20  7:03 AM  Result Value Ref Range   WBC 7.0 4.0 - 10.5 K/uL   RBC 4.26 3.87 - 5.11 MIL/uL   Hemoglobin 13.5 12.0 - 15.0 g/dL   HCT 40.9 36 - 46 %   MCV 96.0 80.0 - 100.0 fL   MCH 31.7 26.0 - 34.0 pg   MCHC 33.0 30.0 - 36.0 g/dL   RDW 17.3 (H) 11.5 - 15.5 %   Platelets 319 150 - 400 K/uL   nRBC 0.0 0.0 - 0.2 %    Comment: Performed at [facility], [address], [place], [place] [address]  Differential     Status: None   Collection Time: 10/19/20  7:03 AM  Result Value Ref Range   Neutrophils Relative % 54 %   Neutro Abs 3.8 1.7 - 7.7 K/uL   Lymphocytes Relative 39 %   Lymphs Abs 2.7 0.7 - 4.0 K/uL   Monocytes Relative 5 %   Monocytes Absolute 0.3 0.1 - 1.0 K/uL   Eosinophils Relative 1 %   Eosinophils Absolute 0.1 0.0 - 0.5 K/uL   Basophils Relative 1 %   Basophils Absolute 0.0 0.0 - 0.1 K/uL   Immature Granulocytes 0 %   Abs Immature Granulocytes 0.02 0.00 - 0.07 K/uL    Comment: Performed at [facility], [address], [place],  [address]  Comprehensive metabolic panel     Status: Abnormal   Collection Time: 10/19/20  7:03 AM  Result Value Ref Range   Sodium 137 135 - 145  mmol/L   Potassium 4.4 3.5 - 5.1 mmol/L   Chloride 105 98 - 111 mmol/L   CO2 23 22 - 32 mmol/L   Glucose, Bld 100 (H) 70 - 99 mg/dL    Comment: Glucose reference range applies only to samples taken after fasting for at least 8 hours.   BUN 8 8 - 23 mg/dL   Creatinine, Ser 0.57 0.44 - 1.00 mg/dL   Calcium 9.0 8.9 - 10.3 mg/dL   Total  Protein 7.7 6.5 - 8.1 g/dL   Albumin 4.3 3.5 - 5.0 g/dL   AST 16 15 - 41 U/L   ALT 11 0 - 44 U/L   Alkaline Phosphatase 61 38 - 126 U/L   Total Bilirubin 0.6 0.3 - 1.2 mg/dL   GFR, Estimated >60 >60 mL/min    Comment: (NOTE) Calculated using the CKD-EPI Creatinine Equation (2021)    Anion gap 9 5 - 15    Comment: Performed at [facility], [address], [place], [place] [address]  TSH     Status: None   Collection Time: 10/19/20  7:03 AM  Result Value Ref Range   TSH 1.339 0.350 - 4.500 uIU/mL    Comment: Performed by a 3rd Generation assay with a functional sensitivity of <=0.01 uIU/mL. Performed at [facility], [address] [place], [place] [address]   Vitamin B12     Status: None   Collection Time: 10/19/20  7:03 AM  Result Value Ref Range   Vitamin B-12 557 180 - 914 pg/mL    Comment: (NOTE) This assay is not validated for testing neonatal or myeloproliferative syndrome specimens for Vitamin B12 levels. Performed at [facility], [address], [place], [place] [address]   VITAMIN D 25 Hydroxy (Vit-D Deficiency, Fractures)     Status: Abnormal   Collection Time: 10/19/20  7:03 AM  Result Value Ref Range   Vit D, 25-Hydroxy 24.38 (L) 30 - 100 ng/mL    Comment: (NOTE) Vitamin D deficiency has been defined by the [place] practice guideline as a level of serum 25-OH  vitamin D less than 20 ng/mL (1,2). The Endocrine Society went on to  further define vitamin D insufficiency as a level between 21 and 29  ng/mL (2).  1. IOM (Institute of Medicine). 2010. Dietary reference intakes for  calcium and D. [place]: The [employer]. 2. Holick MF, Binkley Adona, Bischoff-Ferrari HA, et al. Evaluation,  treatment, and prevention of vitamin D deficiency: an Endocrine  Society clinical practice guideline, JCEM. 2011 Jul; 96(7): 1911-30.  Performed at [facility], [place] [address] [place], [place] [address]   CBG  monitoring, ED     Status: None   Collection Time: 10/19/20  7:05 AM  Result Value Ref Range   Glucose-Capillary 83 70 - 99 mg/dL    Comment: Glucose reference range applies only to samples taken after fasting for at least 8 hours.  I-stat chem 8, ed     Status: Abnormal   Collection Time: 10/19/20  7:14 AM  Result Value Ref Range   Sodium 141 135 - 145 mmol/L   Potassium 4.4 3.5 - 5.1 mmol/L   Chloride 105 98 - 111 mmol/L   BUN 6 (L) 8 - 23 mg/dL   Creatinine, Ser 0.60 0.44 - 1.00 mg/dL   Glucose, Bld 101 (H) 70 - 99 mg/dL    Comment: Glucose reference range applies only to samples taken after fasting  for at least 8 hours.   Calcium, Ion 1.07 (L) 1.15 - 1.40 mmol/L   TCO2 23 22 - 32 mmol/L   Hemoglobin 13.9 12.0 - 15.0 g/dL   HCT 41.0 36 - 46 %  Resp Panel by RT-PCR (Flu A&B, Covid) Nasopharyngeal Swab     Status: None   Collection Time: 10/19/20  9:38 AM   Specimen: Nasopharyngeal Swab; Nasopharyngeal(NP) swabs in vial transport medium  Result Value Ref Range   SARS Coronavirus 2 by RT PCR NEGATIVE NEGATIVE    Comment: (NOTE) SARS-CoV-2 target nucleic acids are NOT DETECTED.  The SARS-CoV-2 RNA is generally detectable in upper respiratory specimens during the acute phase of infection. The lowest concentration of SARS-CoV-2 viral copies this assay can detect is 138 copies/mL. A negative result does not preclude SARS-Cov-2 infection and should not be used as the sole basis for treatment or other patient management decisions. A negative result may occur with  improper specimen collection/handling, submission of specimen other than nasopharyngeal swab, presence of viral mutation(s) within the areas targeted by this assay, and inadequate number of viral copies(<138 copies/mL). A negative result must be combined with clinical observations, patient history, and epidemiological information. The expected result is Negative.  Fact Sheet for Patients:   EntrepreneurPulse.com.au  Fact Sheet for Healthcare Providers:  IncredibleEmployment.be  This test is no t yet approved or cleared by the [place] FDA and  has been authorized for detection and/or diagnosis of SARS-CoV-2 by FDA under an Emergency Use Authorization (EUA). This EUA will remain  in effect (meaning this test can be used) for the duration of the COVID-19 declaration under Section 564(b)(1) of the Act, 21 U.S.C.section 360bbb-3(b)(1), unless the authorization is terminated  or revoked sooner.       Influenza A by PCR NEGATIVE NEGATIVE   Influenza B by PCR NEGATIVE NEGATIVE    Comment: (NOTE) The Xpert Xpress SARS-CoV-2/FLU/RSV plus assay is intended as an aid in the diagnosis of influenza from Nasopharyngeal swab specimens and should not be used as a sole basis for treatment. Nasal washings and aspirates are unacceptable for Xpert Xpress SARS-CoV-2/FLU/RSV testing.  Fact Sheet for Patients: EntrepreneurPulse.com.au  Fact Sheet for Healthcare Providers: IncredibleEmployment.be  This test is not yet approved or cleared by the [place] FDA and has been authorized for detection and/or diagnosis of SARS-CoV-2 by FDA under an Emergency Use Authorization (EUA). This EUA will remain in effect (meaning this test can be used) for the duration of the COVID-19 declaration under Section 564(b)(1) of the Act, 21 U.S.C. section 360bbb-3(b)(1), unless the authorization is terminated or revoked.  Performed at [facility], [address], [place], [place] [address]    US Carotid Bilateral (at [facility] and [facility] only)  Result Date: 10/19/2020 CLINICAL DATA:  86 year old female with history of TIA EXAM: BILATERAL CAROTID DUPLEX ULTRASOUND TECHNIQUE: Pearline Cables scale imaging, color Doppler and duplex ultrasound were performed of bilateral carotid and vertebral arteries in the neck. COMPARISON:  None. FINDINGS:  Criteria: Quantification of carotid stenosis is based on velocity parameters that correlate the residual internal carotid diameter with NASCET-based stenosis levels, using the diameter of the distal internal carotid lumen as the denominator for stenosis measurement. The following velocity measurements were obtained: RIGHT ICA: Peak systolic velocity 67 cm/sec, End diastolic velocity 16 cm/sec CCA: Peak systolic velocity 63 cm/sec SYSTOLIC ICA/CCA RATIO:  1.1 ECA: Peak systolic velocity 948 cm/sec LEFT ICA: Peak systolic velocity 71 cm/sec, End diastolic velocity 23 cm/sec CCA: 83 cm/sec SYSTOLIC ICA/CCA  RATIO:  0.9 ECA: 61 cm/sec RIGHT CAROTID ARTERY: Mild multifocal, circumferential atherosclerotic plaque formation. No significant tortuosity. Normal low resistance waveforms. RIGHT VERTEBRAL ARTERY:  Antegrade flow. LEFT CAROTID ARTERY: Mild multifocal, circumferential atherosclerotic plaque formation. No significant tortuosity. Normal low resistance waveforms. LEFT VERTEBRAL ARTERY:  Antegrade flow. Upper extremity non-invasive blood pressures: Not obtained. IMPRESSION: 1. Right carotid artery system: Less than 50% stenosis secondary to mild multifocal, circumferential atherosclerotic plaque formation. 2. Left carotid artery system: Less than 50% stenosis secondary to mild multifocal, circumferential atherosclerotic plaque formation. 3.  Vertebral artery system: Patent with antegrade flow bilaterally. Ruthann Cancer, MD [facility] Radiology Electronically Signed   By: Ruthann Cancer MD   On: 10/19/2020 14:27   DG Chest Port 1 View  Result Date: 10/19/2020 CLINICAL DATA:  Weakness.  History of breast cancer. EXAM: PORTABLE CHEST 1 VIEW COMPARISON:  Chest radiograph 02/21/2020. FINDINGS: Monitoring leads overlie the patient. Stable cardiac and mediastinal contours. Minimal heterogeneous opacities bilateral lung bases may represent atelectasis. Opacity within the  right upper hemithorax compatible with destructive osseous metastasis and associated soft tissue lesions. Additionally there is progressed fracturing of the left second, fifth and seventh ribs, most compatible with pathologic fractures. Known thoracic osseous metastasis not well visualized on current portable exam. IMPRESSION: 1. Probable pathologic fractures of the left second, fifth and seventh ribs. 2. 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 3. Left greater than right basilar atelectasis. Electronically Signed   By: Lovey Newcomer M.D.   On: 10/19/2020 08:27   ECHOCARDIOGRAM COMPLETE  Result Date: 10/19/2020    ECHOCARDIOGRAM REPORT   Patient Name:   Kara Woods Date of Exam: 10/19/2020 Medical Rec #:  [id number]        Height:       66.0 in Accession #:    [id number]       Weight:       162.6 lb Date of Birth:  29-Nov-1932        BSA:          1.831 m Patient Age:    35 years         BP:           166/102 mmHg Patient Gender: F                HR:           119 bpm. Exam Location:  [facility] Procedure: 2D Echo Indications:    TIA 435.9 / G45.9  History:        Patient has prior history of Echocardiogram examinations, most                 recent 07/24/2020. CAD, TIA, Signs/Symptoms:Chest Pain; Risk                 Factors:Former Smoker, Dyslipidemia and Hypertension. Breast                 Cancer.  Sonographer:    Leavy Cella RDCS (AE) Referring Phys: [place]  1. Left ventricular ejection fraction, by estimation, is 60 to 65%. The left ventricle has normal function. The left ventricle has no regional wall motion abnormalities. Left ventricular diastolic parameters are consistent with Grade I diastolic dysfunction (impaired relaxation).  2. Right ventricular systolic function is normal. The right ventricular size is normal. There is normal pulmonary artery systolic pressure. The estimated right ventricular systolic pressure is 35.0 mmHg.  3. The mitral  valve  is normal in structure. Trivial mitral valve regurgitation. No evidence of mitral stenosis.  4. The aortic valve is normal in structure. Aortic valve regurgitation is not visualized. No aortic stenosis is present.  5. The inferior vena cava is normal in size with greater than 50% respiratory variability, suggesting right atrial pressure of 3 mmHg. Comparison(s): No significant change from prior study. Prior images reviewed side by side. FINDINGS  Left Ventricle: Left ventricular ejection fraction, by estimation, is 60 to 65%. The left ventricle has normal function. The left ventricle has no regional wall motion abnormalities. The left ventricular internal cavity size was normal in size. There is  no left ventricular hypertrophy. Left ventricular diastolic parameters are consistent with Grade I diastolic dysfunction (impaired relaxation). Right Ventricle: The right ventricular size is normal. No increase in right ventricular wall thickness. Right ventricular systolic function is normal. There is normal pulmonary artery systolic pressure. The tricuspid regurgitant velocity is 2.58 m/s, and  with an assumed right atrial pressure of 3 mmHg, the estimated right ventricular systolic pressure is 78.6 mmHg. Left Atrium: Left atrial size was normal in size. Right Atrium: Right atrial size was normal in size. Pericardium: There is no evidence of pericardial effusion. Mitral Valve: The mitral valve is normal in structure. Trivial mitral valve regurgitation. No evidence of mitral valve stenosis. Tricuspid Valve: The tricuspid valve is normal in structure. Tricuspid valve regurgitation is trivial. No evidence of tricuspid stenosis. Aortic Valve: The aortic valve is normal in structure. Aortic valve regurgitation is not visualized. No aortic stenosis is present. Pulmonic Valve: The pulmonic valve was normal in structure. Pulmonic valve regurgitation is not visualized. No evidence of pulmonic stenosis. Aorta: The aortic root is  normal in size and structure. Venous: The inferior vena cava is normal in size with greater than 50% respiratory variability, suggesting right atrial pressure of 3 mmHg. IAS/Shunts: No atrial level shunt detected by color flow Doppler.  LEFT VENTRICLE PLAX 2D LVIDd:         3.84 cm  Diastology LVIDs:         2.76 cm  LV e' lateral:   5.55 cm/s LV PW:         1.34 cm  LV E/e' lateral: 9.4 LV IVS:        1.07 cm LVOT diam:     1.90 cm LVOT Area:     2.84 cm  RIGHT VENTRICLE RV S prime:     8.27 cm/s TAPSE (M-mode): 1.5 cm LEFT ATRIUM             Index       RIGHT ATRIUM          Index LA diam:        2.80 cm 1.53 cm/m  RA Area:     9.57 cm LA Vol (A2C):   23.2 ml 12.67 ml/m RA Volume:   16.60 ml 9.06 ml/m LA Vol (A4C):   53.9 ml 29.43 ml/m LA Biplane Vol: 37.1 ml 20.26 ml/m   AORTA Ao Root diam: 3.00 cm MITRAL VALVE               TRICUSPID VALVE MV Area (PHT): 4.01 cm    TR Peak grad:   26.6 mmHg MV Decel Time: 189 msec    TR Vmax:        258.00 cm/s MV E velocity: 52.30 cm/s MV A velocity: 72.40 cm/s  SHUNTS MV E/A ratio:  0.72  Systemic Diam: 1.90 cm Candee Furbish MD Electronically signed by Candee Furbish MD Signature Date/Time: 10/19/2020/3:28:18 PM    Final    CT HEAD CODE STROKE WO CONTRAST  Result Date: 10/19/2020 CLINICAL DATA:  Code stroke.  Neuro deficit, acute, stroke suspected EXAM: CT HEAD WITHOUT CONTRAST TECHNIQUE: Contiguous axial images were obtained from the base of the skull through the vertex without intravenous contrast. COMPARISON:  07/24/2020 MRI head and prior. 07/24/2020 head CT and prior. FINDINGS: Brain: No acute infarct or intracranial hemorrhage. No mass lesion. No midline shift, ventriculomegaly or extra-axial fluid collection. Mild cerebral atrophy with ex vacuo dilatation. Chronic microvascular ischemic changes. Vascular: No hyperdense vessel or unexpected calcification. Bilateral skull base atherosclerotic calcifications. Skull: Negative for fracture or focal lesion.  Sinuses/Orbits: No acute finding. Pneumatized paranasal sinuses and mastoid air cells. Other: None. ASPECTS [facility] Stroke Program Early CT Score) - Ganglionic level infarction (caudate, lentiform nuclei, internal capsule, insula, M1-M3 cortex): 7 - Supraganglionic infarction (M4-M6 cortex): 3 Total score (0-10 with 10 being normal): 10 IMPRESSION: 1. No evidence of acute intracranial abnormality. 2. ASPECTS is 10. 3. Mild cerebral atrophy and chronic microvascular ischemic changes. Code stroke imaging results were communicated on 10/19/2020 at 7:14 am to provider Dr. Tomi Bamberger Via telephone, who verbally acknowledged these results. Electronically Signed   By: Primitivo Gauze M.D.   On: 10/19/2020 07:16    Pending Labs Unresulted Labs (From admission, onward)          Start     Ordered   10/20/20 0500  Hemoglobin A1c  Tomorrow morning,   R        10/19/20 0946   10/20/20 0500  Lipid panel  Tomorrow morning,   R       Comments: Fasting    10/19/20 0946          Vitals/Pain Today's Vitals   10/19/20 1545 10/19/20 1546 10/19/20 1615 10/19/20 1630  BP: (!) 157/76  (!) 166/94 (!) 161/106  Pulse: 74  74 89  Resp: 14  18 16   Temp:      TempSrc:      SpO2: 94%  95% 94%  Weight:      PainSc:  Asleep      Isolation Precautions No active isolations  Medications Medications   stroke: mapping our early stages of recovery book ( Does not apply Not Given 10/19/20 1125)  0.9 %  sodium chloride infusion ( Intravenous New Bag/Given 10/19/20 1130)  acetaminophen (TYLENOL) tablet 650 mg (650 mg Oral Given 10/19/20 1506)    Or  acetaminophen (TYLENOL) 160 MG/5ML solution 650 mg ( Per Tube See Alternative 10/19/20 1506)    Or  acetaminophen (TYLENOL) suppository 650 mg ( Rectal See Alternative 10/19/20 1506)  senna-docusate (Senokot-S) tablet 1 tablet (has no administration in time range)  heparin injection 5,000 Units (5,000 Units Subcutaneous Given 10/19/20 1332)  clopidogrel (PLAVIX)  tablet 75 mg (75 mg Oral Given 10/19/20 1055)  aspirin EC tablet 162 mg (162 mg Oral Given 10/19/20 1054)  pantoprazole (PROTONIX) EC tablet 40 mg (40 mg Oral Given 10/19/20 1053)  anastrozole (ARIMIDEX) tablet 1 mg (1 mg Oral Given 10/19/20 1053)  escitalopram (LEXAPRO) tablet 10 mg (10 mg Oral Given 10/19/20 1054)  ezetimibe (ZETIA) tablet 10 mg (10 mg Oral Given 10/19/20 1128)  gabapentin (NEURONTIN) capsule 100 mg (100 mg Oral Given 10/19/20 1609)  HYDROcodone-acetaminophen (NORCO/VICODIN) 5-325 MG per tablet 1 tablet (has no administration in time range)  latanoprost (XALATAN) 0.005 % ophthalmic solution  1 drop (has no administration in time range)  metoprolol tartrate (LOPRESSOR) tablet 12.5 mg (12.5 mg Oral Given 10/19/20 1054)  polyvinyl alcohol (LIQUIFILM TEARS) 1.4 % ophthalmic solution 3-4 drop (has no administration in time range)  HYDROcodone-acetaminophen (NORCO/VICODIN) 5-325 MG per tablet 1 tablet (1 tablet Oral Given 10/19/20 0815)    Mobility walks with person assist Low fall risk   Focused Assessments    R Recommendations: See Admitting Provider Note  Report given to:   Additional Notes:

## 2020-10-19 NOTE — Progress Notes (Signed)
*  PRELIMINARY RESULTS* Echocardiogram 2D Echocardiogram has been performed.  Leavy Cella 10/19/2020, 10:50 AM

## 2020-10-20 ENCOUNTER — Observation Stay (HOSPITAL_COMMUNITY): Payer: Medicare Other

## 2020-10-20 DIAGNOSIS — G459 Transient cerebral ischemic attack, unspecified: Secondary | ICD-10-CM | POA: Diagnosis not present

## 2020-10-20 LAB — LIPID PANEL
Cholesterol: 162 mg/dL (ref 0–200)
HDL: 46 mg/dL (ref >40)
LDL Cholesterol: 97 mg/dL (ref 0–99)
Total CHOL/HDL Ratio: 3.5 RATIO
Triglycerides: 97 mg/dL (ref <150)
VLDL: 19 mg/dL (ref 0–40)

## 2020-10-20 LAB — GLUCOSE, CAPILLARY: Glucose-Capillary: 91 mg/dL (ref 70–99)

## 2020-10-20 LAB — HEMOGLOBIN A1C
Hgb A1c MFr Bld: 5.4 % (ref 4.8–5.6)
Mean Plasma Glucose: 108.28 mg/dL

## 2020-10-20 LAB — MAGNESIUM: Magnesium: 2.2 mg/dL (ref 1.7–2.4)

## 2020-10-20 IMAGING — MR MR HEAD W/O CM
13 of 14 series · 36 of 48 positions shown · non-contrast
Comparison: MRI head [DATE].

CLINICAL DATA: TIA.  Episode of difficulty talking yesterday.

EXAM:
MRI HEAD WITHOUT CONTRAST
MRA HEAD WITHOUT CONTRAST
TECHNIQUE: Multiplanar, multiecho pulse sequences of the brain and surrounding
structures were obtained without intravenous contrast. Angiographic
images of the head were obtained using MRA technique without
contrast.

[Series 5: DWI · axial · 4.0mm · 0.88mm/px · z∈[-54,+93]mm · 3 of 38 slices shown (1 of 6)]
[im 1/38]
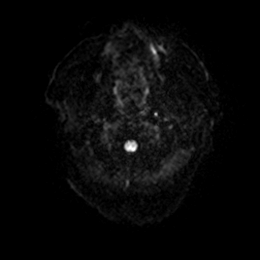
[im 19/38]
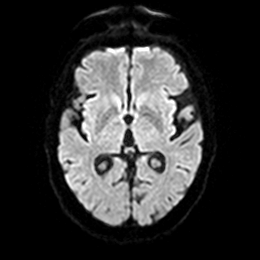
[im 38/38]
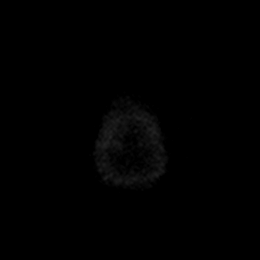

[Series 5: DWI · axial · 4.0mm · 0.88mm/px · z∈[-54,+93]mm · 3 of 38 slices shown (2 of 6)]
[im 1/38]
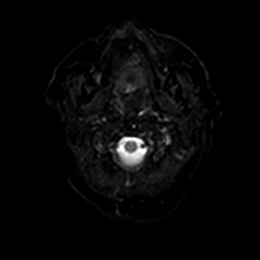
[im 19/38]
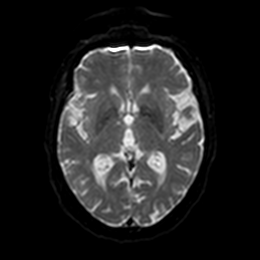
[im 38/38]
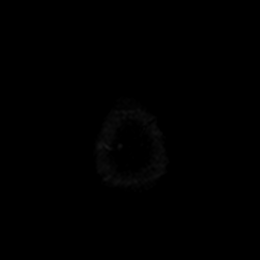

[Series 6: DWI · axial · 4.0mm · 0.88mm/px · z∈[-54,+93]mm · 3 of 38 slices shown (3 of 6)]
[im 1/38]
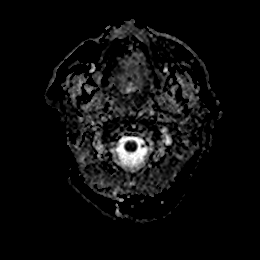
[im 19/38]
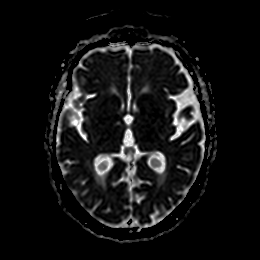
[im 38/38]
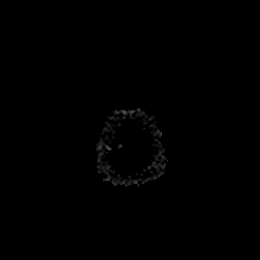

[Series 7: DWI · coronal · 5.0mm · 0.88mm/px · 2 of 30 slices shown (4 of 6)]
[im 1/30]
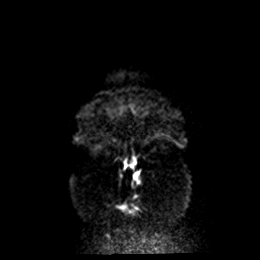
[im 30/30]
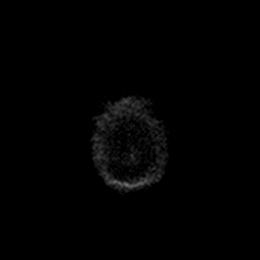

[Series 7: DWI · coronal · 5.0mm · 0.88mm/px · 2 of 30 slices shown (5 of 6)]
[im 1/30]
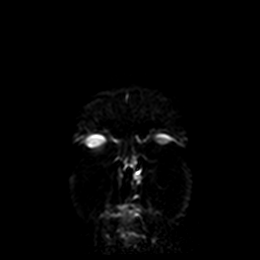
[im 30/30]
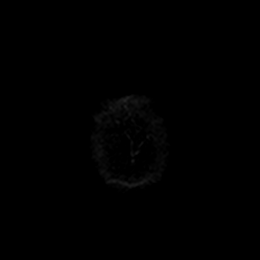

[Series 8: DWI · coronal · 5.0mm · 0.88mm/px · 2 of 30 slices shown (6 of 6)]
[im 1/30]
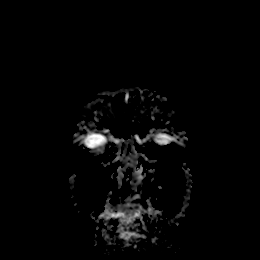
[im 30/30]
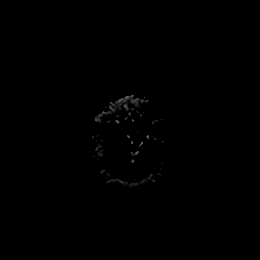

[Series 9: T1 · sagittal · 5.0mm · 0.75mm/px · 2 of 21 slices shown]
[im 1/21]
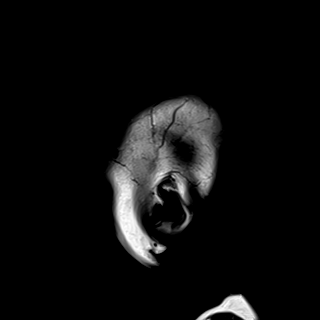
[im 21/21]
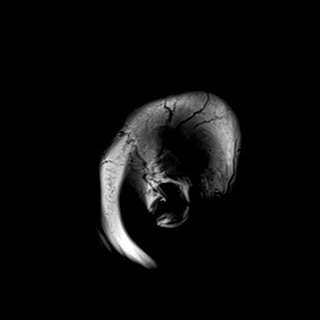

[Series 10: T2 · axial · 5.0mm · 0.72mm/px · z∈[-55,+91]mm · 2 of 22 slices shown (1 of 2)]
[im 1/22]
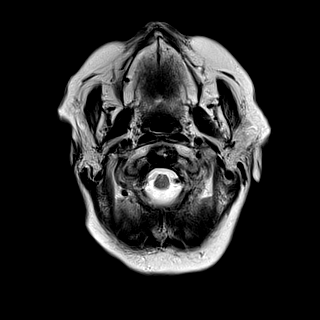
[im 22/22]
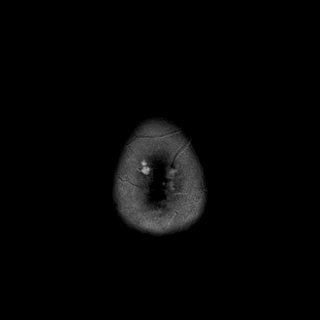

[Series 11: mag_images · axial · 3.0mm · 0.90mm/px · z∈[-59,+93]mm · 4 of 52 slices shown]
[im 1/52]
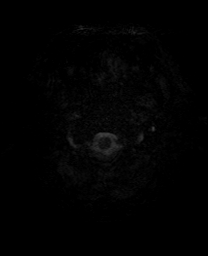
[im 18/52]
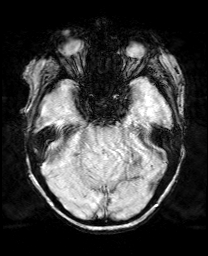
[im 35/52]
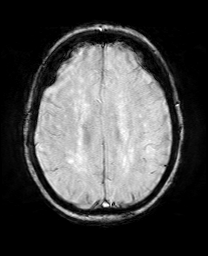
[im 52/52]
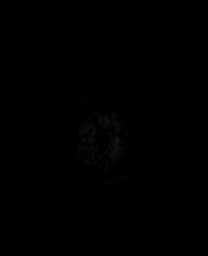

[Series 12: pha_images · axial · 3.0mm · 0.90mm/px · z∈[-59,+93]mm · 4 of 52 slices shown]
[im 1/52]
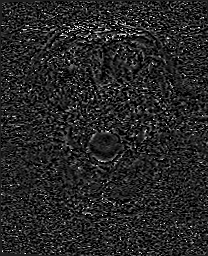
[im 18/52]
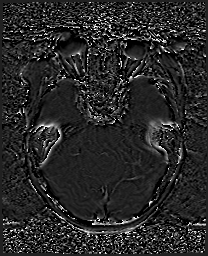
[im 35/52]
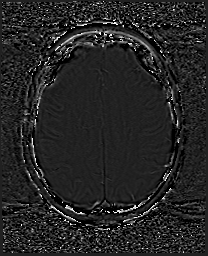
[im 52/52]
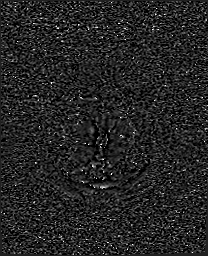

[Series 13: swi_images · axial · 3.0mm · 0.90mm/px · z∈[-59,+93]mm · 4 of 52 slices shown]
[im 1/52]
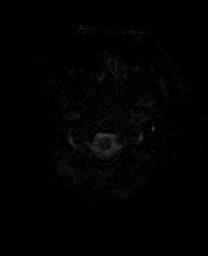
[im 18/52]
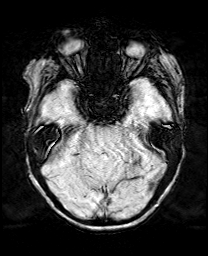
[im 35/52]
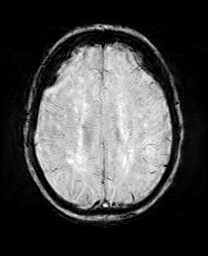
[im 52/52]
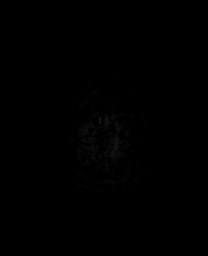

[Series 15: FLAIR · axial · 4.0mm · 0.43mm/px · z∈[-56,+91]mm · 3 of 38 slices shown]
[im 1/38]
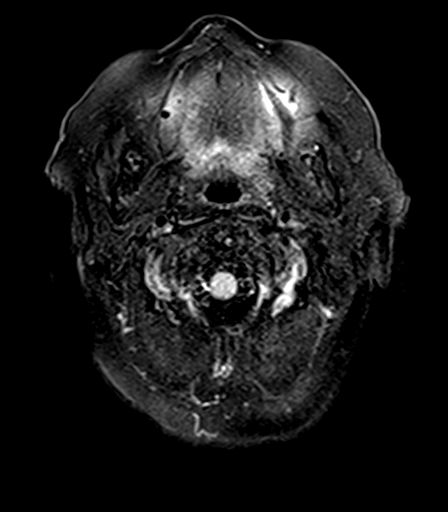
[im 19/38]
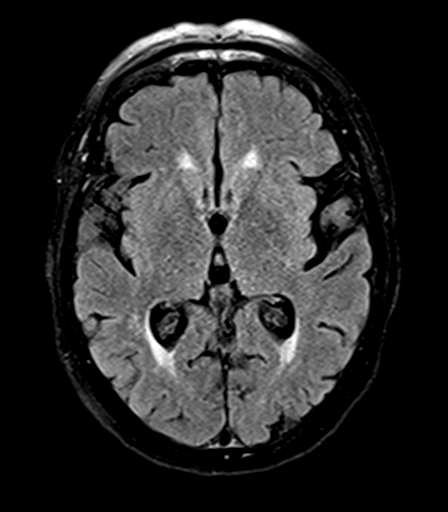
[im 38/38]
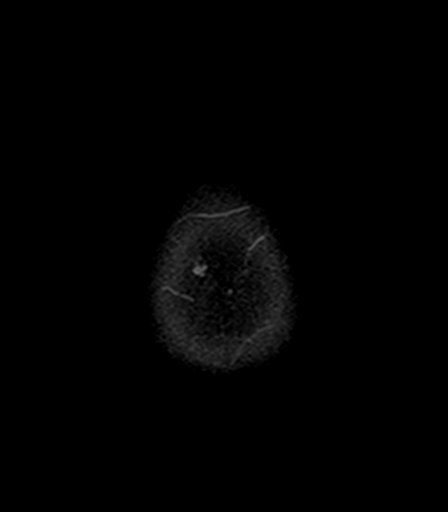

[Series 17: T2 · coronal · 5.0mm · 0.72mm/px · 2 of 30 slices shown (2 of 2)]
[im 1/30]
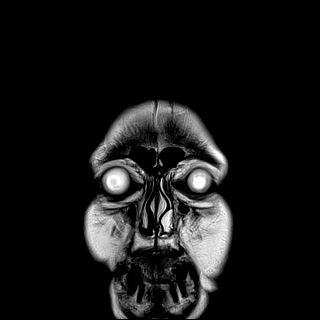
[im 30/30]
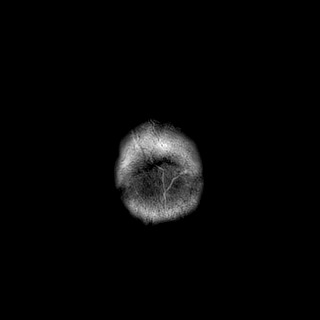

[36 of 48 positions shown; findings below may reference images not displayed]

FINDINGS: MRI HEAD FINDINGS

Brain: No acute infarction, hemorrhage, hydrocephalus, extra-axial
collection or mass lesion. Similar small remote lacunar infarcts in
bilateral cerebellar hemispheres. Moderate scattered T2/FLAIR
hyperintensities within the white matter, compatible with chronic
microvascular ischemic disease.

Skull and upper cervical spine: Normal marrow signal.

Sinuses/Orbits: No acute findings.  Left scleral buckle.

Other: Small right mastoid effusion.

MRA HEAD FINDINGS

Anterior circulation: Bilateral petrous and cavernous internal
carotid arteries are patent. Bilateral proximal MCA arteries are
patent without evidence of proximal hemodynamically significant
stenosis or large vessel occlusion. Patent bilateral ACA arteries.
Possible focal stenosis of the right A3 ACA artery, although
artifact in this region limits evaluation. No aneurysm is identified

Posterior circulation: Bilateral vertebral arteries are patent.
Moderate stenosis of the mid basilar artery. Patent bilateral
posterior cerebral arteries with bilateral posterior communicating
arteries. Likely mild to moderate left P2 PCA narrowing. No evidence
of aneurysm.
IMPRESSION: 1. No evidence of acute intracranial abnormality.  No acute infarct.
2. No large vessel intracranial occlusion. Possible severe right A3
ACA stenosis, although evaluation is limited by artifact in this
region. Additionally, there is suspected moderate stenosis of the
mid basilar artery and mild-to-moderate left PCA stenosis. A CTA
could better characterize if clinically indicated.

## 2020-10-20 IMAGING — MR MR MRA HEAD W/O CM
1 series · 19 of 48 positions shown · non-contrast
Comparison: MRI head [DATE].

CLINICAL DATA: TIA.  Episode of difficulty talking yesterday.

EXAM:
MRI HEAD WITHOUT CONTRAST
MRA HEAD WITHOUT CONTRAST
TECHNIQUE: Multiplanar, multiecho pulse sequences of the brain and surrounding
structures were obtained without intravenous contrast. Angiographic
images of the head were obtained using MRA technique without
contrast.

[Series 5: TOF · axial · 0.4mm · 0.43mm/px · z∈[-54,+42]mm · 19 of 254 slices shown]
[im 1/254]
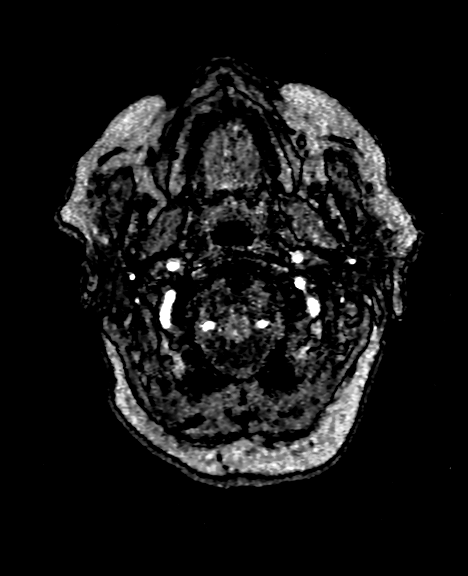
[im 6/254]
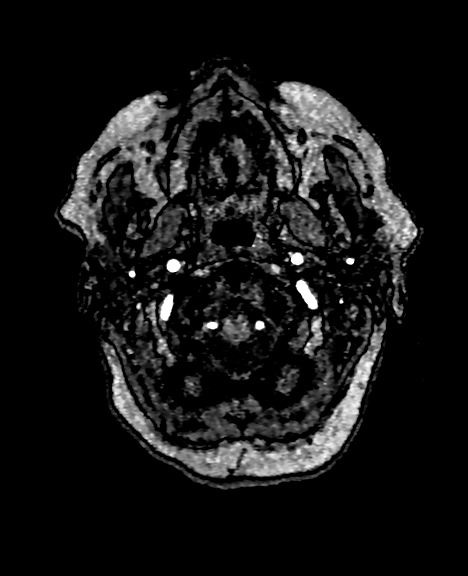
[im 11/254]
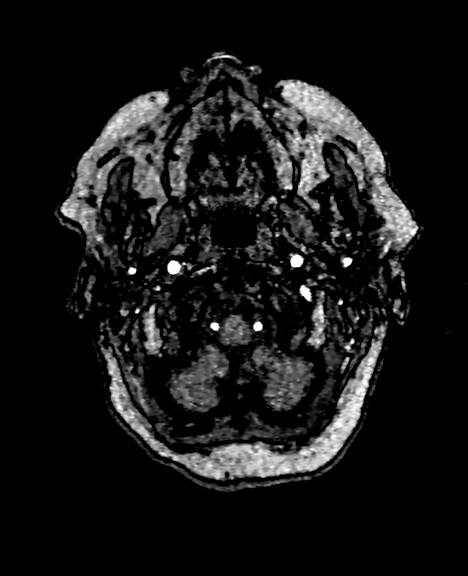
[im 17/254]
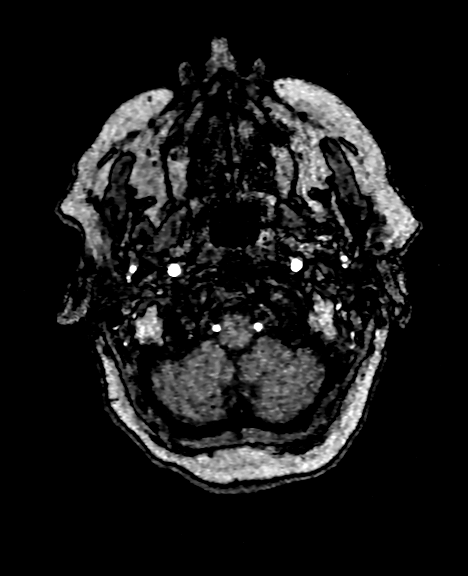
[im 22/254]
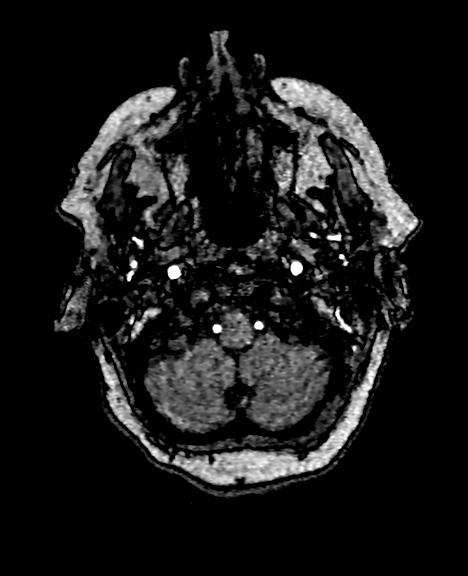
[im 27/254]
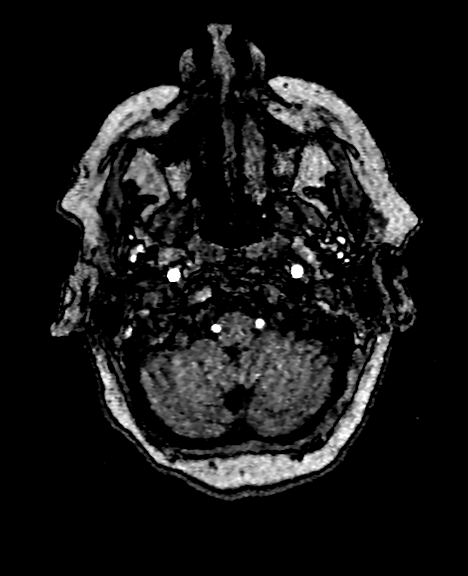
[im 33/254]
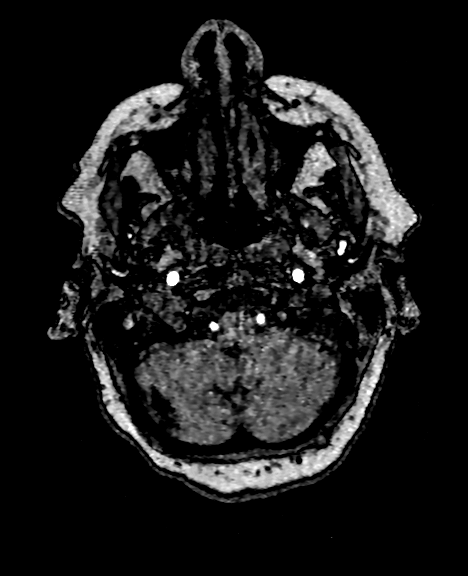
[im 38/254]
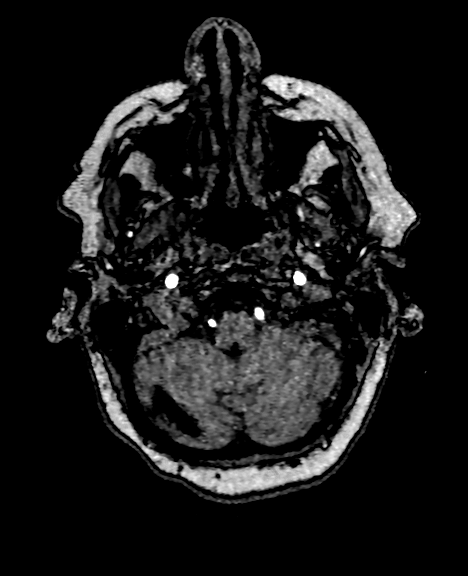
[im 44/254]
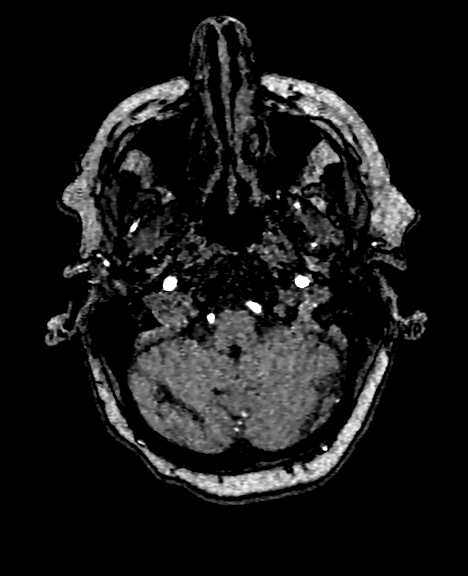
[im 49/254]
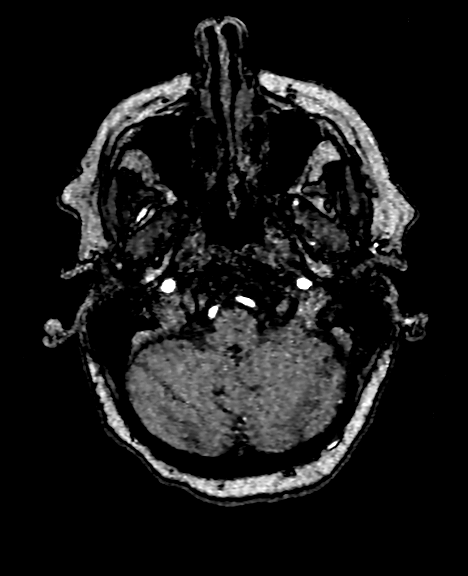
[im 54/254]
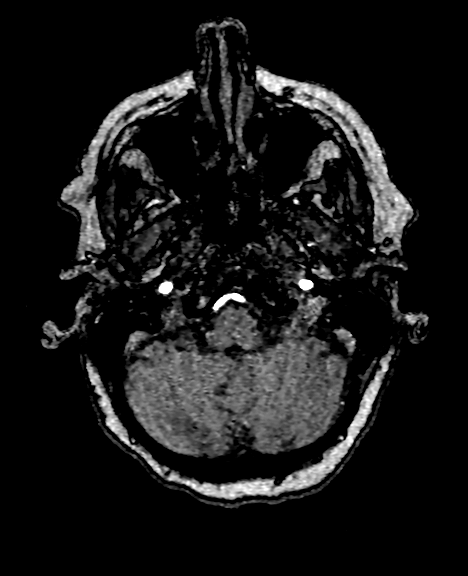
[im 81/254]
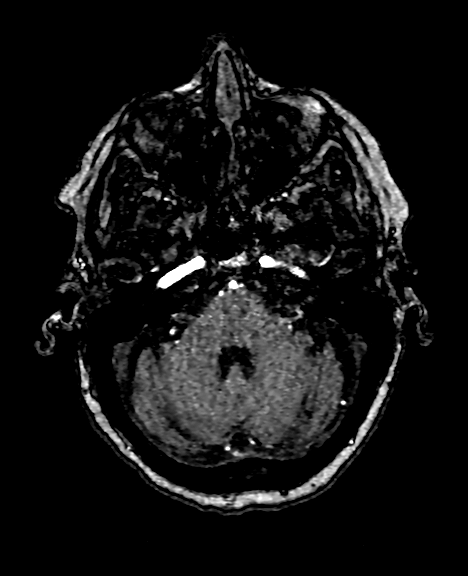
[im 114/254]
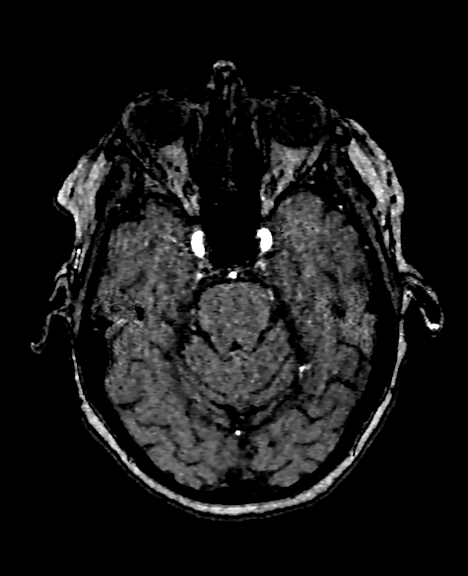
[im 130/254]
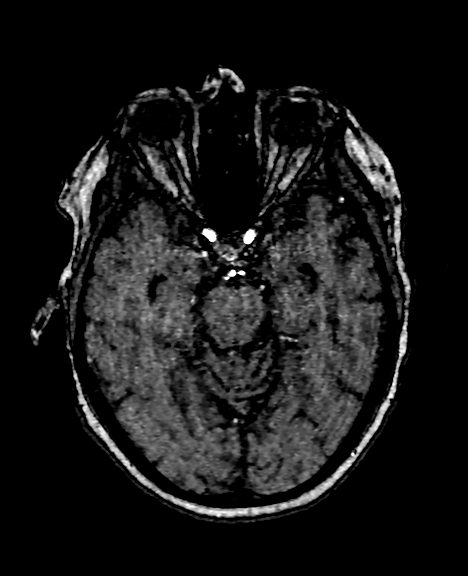
[im 146/254]
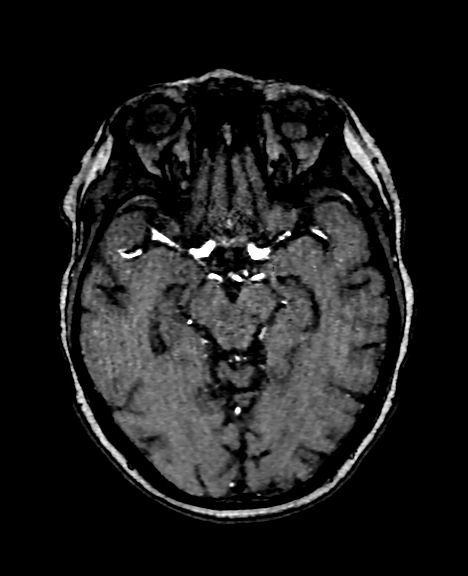
[im 178/254]
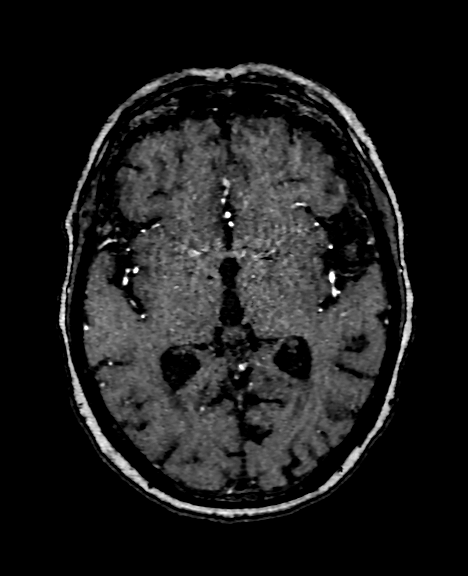
[im 210/254]
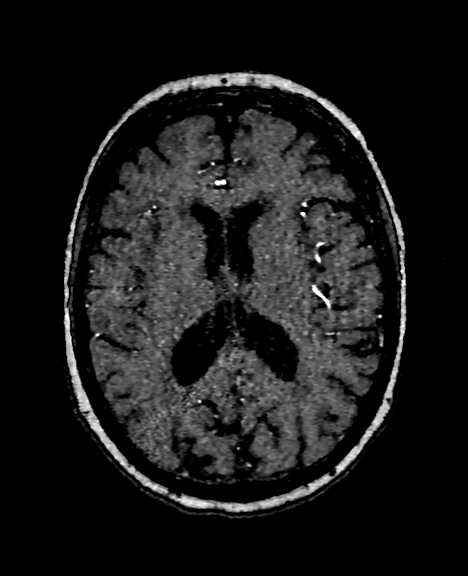
[im 216/254]
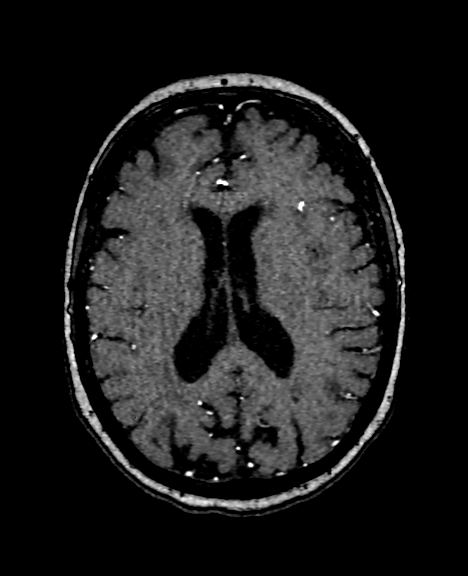
[im 243/254]
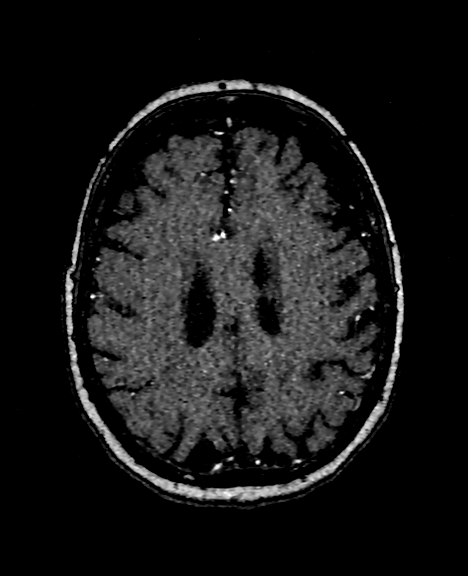

[19 of 48 positions shown; findings below may reference images not displayed]

FINDINGS: MRI HEAD FINDINGS

Brain: No acute infarction, hemorrhage, hydrocephalus, extra-axial
collection or mass lesion. Similar small remote lacunar infarcts in
bilateral cerebellar hemispheres. Moderate scattered T2/FLAIR
hyperintensities within the white matter, compatible with chronic
microvascular ischemic disease.

Skull and upper cervical spine: Normal marrow signal.

Sinuses/Orbits: No acute findings.  Left scleral buckle.

Other: Small right mastoid effusion.

MRA HEAD FINDINGS

Anterior circulation: Bilateral petrous and cavernous internal
carotid arteries are patent. Bilateral proximal MCA arteries are
patent without evidence of proximal hemodynamically significant
stenosis or large vessel occlusion. Patent bilateral ACA arteries.
Possible focal stenosis of the right A3 ACA artery, although
artifact in this region limits evaluation. No aneurysm is identified

Posterior circulation: Bilateral vertebral arteries are patent.
Moderate stenosis of the mid basilar artery. Patent bilateral
posterior cerebral arteries with bilateral posterior communicating
arteries. Likely mild to moderate left P2 PCA narrowing. No evidence
of aneurysm.
IMPRESSION: 1. No evidence of acute intracranial abnormality.  No acute infarct.
2. No large vessel intracranial occlusion. Possible severe right A3
ACA stenosis, although evaluation is limited by artifact in this
region. Additionally, there is suspected moderate stenosis of the
mid basilar artery and mild-to-moderate left PCA stenosis. A CTA
could better characterize if clinically indicated.

## 2020-10-20 MED ORDER — ACETAMINOPHEN 325 MG PO TABS: 650.0000 mg | ORAL_TABLET | Freq: Four times a day (QID) | ORAL | 0 refills | Status: AC | PRN

## 2020-10-20 MED ORDER — PANTOPRAZOLE SODIUM 40 MG PO TBEC
40.0000 mg | DELAYED_RELEASE_TABLET | Freq: Every day | ORAL | 1 refills | Status: DC
Start: 2020-10-21 — End: 2021-12-03

## 2020-10-20 MED ORDER — ASPIRIN 81 MG PO TBEC
162.0000 mg | DELAYED_RELEASE_TABLET | Freq: Every day | ORAL | 2 refills | Status: DC
Start: 2020-10-21 — End: 2021-09-17

## 2020-10-20 MED ORDER — METOPROLOL TARTRATE 25 MG PO TABS
ORAL_TABLET | ORAL | Status: DC
Start: 2020-10-20 — End: 2021-02-06

## 2020-10-20 NOTE — Progress Notes (Signed)
6:12 AM RN called regarding patient making some babbling sounds which she states was similar to why she was admitted.  Patient was alert and oriented x4 and was able to follow commands.  No facial droop or any focal deficit. CT of head without contrast on on admission showed no evidence of acute retinal abnormality Ultrasound carotid bilateral showed less than 50% stenosis secondary to mild multifocal, circumferential atherosclerotic plaque formation in both left and right carotid arteries, vertebral artery system was patent with antegrade flow bilaterally. We shall continue to monitor for fall precaution, neurochecks, seizure precaution MRI brain and MR angio of head pending and will be done in the morning.

## 2020-10-20 NOTE — Progress Notes (Signed)
Pt discharged home. Daughter transported pt home. Discharge instructions given to pt and family. No further questions at this time.

## 2020-10-20 NOTE — Progress Notes (Signed)
OT Cancellation Note  Patient Details Name: Kara Woods MRN: [id number] DOB: [birth year]   Cancelled Treatment:    Reason Eval/Treat Not Completed: Other (comment). Chart reviewed. Pt admitted with suspected TIA secondary to speech deficits, MRI negative for acute infarct. Discussed pt with PT, pt without focal deficits, presents with generalized weakness and fatigue. Family is able to provide 24/7 assistance as needed on discharge. Will continue to follow and evaluate on Monday if pt still admitted, sooner if deemed necessary.     Guadelupe Sabin, OTR/L  [phone] 10/20/2020, 9:48 AM

## 2020-10-20 NOTE — Progress Notes (Signed)
SLP Cancellation Note  Patient Details Name: Kara Woods MRN: [id number] DOB: [birth year]   Cancelled treatment:       Reason Eval/Treat Not Completed: SLP screened, no needs identified, will sign off. Speech changes and symptoms have resolved. MRI negative for acute infarct; suspected TIA. There are no further ST needs at this time. Thank you,  Otoniel Myhand H. Roddie Mc, CCC-SLP Speech Language Pathologist    Wende Bushy 10/20/2020, 10:04 AM

## 2020-10-20 NOTE — TOC Initial Note (Addendum)
Transition of Care [facility]) - Initial/Assessment Note    Patient Details  Name: Kara Woods MRN: [id number] Date of Birth: Apr 16, 1933  Transition of Care [facility]) CM/SW Contact:    Boneta Lucks, RN Phone Number: 10/20/2020, 11:13 AM  Clinical Narrative:       Patient admitted in OBS  with TIA, Pending MRI today, may discharge home. Patient is blind, lives alone, walks independently. PT is recommending HHPT. TOC spoke with Lelon Frohlich - daughter. Agreeable to [facility]. States she has multiple children and support. No preference on HH. Linda with [facility] accepted the referral for PT/RN.          Expected Discharge Plan: [place] Barriers to Discharge: Continued Medical Work up   Patient Goals and CMS Choice Patient states their goals for this hospitalization and ongoing recovery are:: to go home CMS Medicare.gov Compare Post Acute Care list provided to:: Patient Choice offered to / list presented to : Patient  Expected Discharge Plan and Services Expected Discharge Plan: [place] Choice: [place] arrangements for the past 2 months: [place] Arranged: RN, PT [facility] Agency: [place] ([facility]) Date [place]: 10/20/20 Time [place]: 1112 Representative spoke with at [place]: [place] Arrangements/Services Living arrangements for the past 2 months: [place] with:: Self   Do you feel safe going back to the place where you live?: Yes      Need for Family Participation in Patient Care: Yes (Comment) Care giver support system in place?: Yes (comment)   Criminal Activity/Legal Involvement Pertinent to Current Situation/Hospitalization: No - Comment as needed  Activities of Daily Living Home Assistive Devices/Equipment: Eyeglasses, Dentures (specify type), Other (Comment) (Life alert necklace) ADL Screening (condition at time  of admission) Patient's cognitive ability adequate to safely complete daily activities?: Yes Is the patient deaf or have difficulty hearing?: Yes Does the patient have difficulty seeing, even when wearing glasses/contacts?: No Does the patient have difficulty concentrating, remembering, or making decisions?: No Patient able to express need for assistance with ADLs?: Yes Does the patient have difficulty dressing or bathing?: No Independently performs ADLs?: Yes (appropriate for developmental age) Does the patient have difficulty walking or climbing stairs?: Yes Weakness of Legs: Both Weakness of Arms/Hands: None  Permission Sought/Granted      Share Information with NAME: Lelon Frohlich     Permission granted to share info w Relationship: Daughter     Emotional Assessment     Affect (typically observed): Accepting Orientation: : Oriented to Self, Oriented to Place, Oriented to  Time, Oriented to Situation Alcohol / Substance Use: Not Applicable Psych Involvement: No (comment)  Admission diagnosis:  TIA (transient ischemic attack) [G45.9] Patient Active Problem List   Diagnosis Date Noted  . TIA (transient ischemic attack) 07/24/2020  . Dizziness 11/19/2016  . CAD S/P percutaneous coronary angioplasty 11/06/2016  . Essential hypertension 11/06/2016  . Unstable angina ([place]) 11/04/2016  . S/P vaginal hysterectomy 05/17/2014  . Cystocele 10/11/2013  . Pelvic relaxation 10/11/2013  . History of breast cancer 04/20/2013  . After cataract, bilateral 04/07/2013  . Corneal edema 04/07/2013  . Dry eyes 04/07/2013  . Diarrhea 06/23/2012  . Schatzki's ring 07/16/2011  . COLLES' FRACTURE, LEFT 04/04/2010  . Chest pain in adult 09/18/2009  .  Dyslipidemia, goal LDL below 70 07/28/2008  . ANXIETY 07/28/2008  . MACULAR DEGENERATION, BILATERAL 07/28/2008  . GERD 07/28/2008  . PROCTITIS 07/28/2008  . OSTEOPENIA 07/28/2008  . RETINAL DETACHMENT, LEFT EYE, HX OF 07/28/2008   PCP:  Celene Squibb,  MD Pharmacy:   [id number] - [place], [address] [place] [address] Phone: [phone] Fax: [place], [place] [address] [place] [address] Phone: [phone] Fax: [phone]

## 2020-10-20 NOTE — Progress Notes (Signed)
Patient appeared to have a babbling episode similar to why she came in. Patient still able to follow commands. Still alert and oriented x 4. Paged Hospitalist awaiting response at this time.

## 2020-10-20 NOTE — Plan of Care (Signed)
  Problem: Acute Rehab PT Goals(only PT should resolve) Goal: Pt Will Go Supine/Side To Sit Outcome: Progressing [place] (Taken 10/20/2020 0924) Pt will go Supine/Side to Sit: with supervision Goal: Pt Will Go Sit To Supine/Side Outcome: Progressing Flowsheets (Taken 10/20/2020 0924) Pt will go Sit to Supine/Side: with supervision Goal: Patient Will Transfer Sit To/From Stand Outcome: Progressing [place] (Taken 10/20/2020 0924) Patient will transfer sit to/from stand: with min guard assist Goal: Pt Will Transfer Bed To Chair/Chair To Bed Outcome: Progressing Flowsheets (Taken 10/20/2020 0924) Pt will Transfer Bed to Chair/Chair to Bed: min guard assist Goal: Pt Will Ambulate Outcome: Progressing Flowsheets (Taken 10/20/2020 0924) Pt will Ambulate:  50 feet  with min guard assist  with least restrictive assistive device Goal: Pt/caregiver will Perform Home Exercise Program Outcome: Progressing Flowsheets (Taken 10/20/2020 0924) Pt/caregiver will Perform Home Exercise Program:  For increased strengthening  For improved balance  Independently  9:25 AM, 10/20/20 Mearl Latin PT, DPT Physical Therapist at [facility]

## 2020-10-20 NOTE — Discharge Summary (Signed)
Physician Discharge Summary  Kara Woods FGH:[id number] DOB: [birth year] DOA: 10/19/2020  PCP: Celene Squibb, MD  Admit date: 10/19/2020 Discharge date: 10/20/2020  Time spent: 30 minutes  Recommendations for Outpatient Follow-up:  1. Repeat basic metabolic panel to follow across renal function 2. Reassess blood pressure and adjust antihypertensive treatment as needed   Discharge Diagnoses:  Active Problems:   TIA (transient ischemic attack) Hypertension Hyperlipidemia History of breast cancer with metastasis Depression/anxiety Gastroesophageal flux disease Physical deconditioning Increase intraocular pressure Glaucoma   Discharge Condition: Stable and improved.  Discharged home with instruction to follow-up with PCP in 10 days.  Also recommendation for outpatient follow-up with neurology in 4 weeks instructed.  CODE STATUS: Full code.  Diet recommendation: Heart healthy diet  Filed Weights   10/19/20 0718 10/19/20 1952  Weight: 73.8 kg 65.6 kg    History of present illness:  Kara Woods is a 86 y.o. female with medical history significant of anxiety/depression, glaucoma/increase intraocular pressure, hypertension, hyperlipidemia, breast cancer, gastroesophageal reflux disease and chronic back pain; who presented to the emergency department after experiencing acute difficulty finding words and weakness.  Family reports to some babbling speech and unable to understand what the patient was trying to say.  There was no other focal neurologic deficit appreciated at that time.  Patient later expressed feeling generally weak and tired from her baseline.  There was no fever, no chest pain, no nausea, no vomiting, no dysuria, no hematuria, no abdominal pain, no chills, no sick contacts.  Of note, it is exactly unknown what was the last time that the patient was seen normal; but her symptoms were completely resolved by the time of my evaluation.  ED Course: CT scan of the  head demonstrated no acute intracranial normalities; patient seen by neurology service and at that time symptoms were already improving/resolving completely.  Based on her underlying history and risk factors recommendations given to admit patient to the hospital to complete a stroke work-up.  Will continue the use of aspirin and Plavix for secondary prevention.  [facility] has been contacted to place patient in the hospital to facilitate further evaluation and management.  Hospital Course:  1-TIA -Complete work-up negative for acute stroke -Follow neurology recommendations patient will continue the use of aspirin (162 mg) and Plavix. -Continue risk factor modification (blood pressure control and the use of zetia/low-fat diet). -Outpatient follow-up with neurology in 4 weeks.  2-carotid artery disease -50% plaque buildup appreciated bilaterally; patent with good flow otherwise. -Continue risk factor modifications, low-fat diet, aspirin, Plavix and the use of zetia.  3-esophageal reflux disease -Patient will be discharged on Protonix.  4-hypertension -Continue low-sodium diet -Metoprolol dose has been adjusted for better control of her blood pressure.  5-history of breast cancer -Continue the use of antiemetics -Continue patient follow-up with oncology service.  6-depression/anxiety -Continue the use of Lexapro and Tranxene -No suicidal ideation or hallucinations currently.  7-neuropathy -Continue Neurontin.  8-history of increased intraocular pressure/glaucoma -Continue the use of latanoprost.  9-physical deconditioning/weakness -Seen by physical therapy and will be discharged home with home health services for rehabilitation and conditioning.  Procedures: See below for x-ray reports 2D echo: Preserved ejection fraction, no wall motion abnormalities or significant valvular disorder.  Consultations:  Neurology  Discharge Exam: Vitals:   10/20/20 0800 10/20/20 1413  BP: 125/80  131/70  Pulse: 98 79  Resp: 18 18  Temp: 98.2 F (36.8 C) 98.1 F (36.7 C)  SpO2: 97% 94%    General: Afebrile,  no chest pain, no nausea, no vomiting, no further episodes of neurologic deficits.  Feeling ready to go home. Cardiovascular: Rate controlled, no rubs, no gallops, no JVD. Respiratory: Good air movement bilaterally; no using accessory muscle.  Good oxygen saturation on room air. Abdomen: Soft, nontender, nontender, positive bowel sounds Extremities: No cyanosis or clubbing.  Discharge Instructions   Discharge Instructions    Diet - low sodium heart healthy   Complete by: As directed    Discharge instructions   Complete by: As directed    Take medications as prescribed Maintain adequate hydration Make sure to have increasing your overall/nutritional state and use feeding supplements in between meals as discussed. Arrange follow-up with PCP in 10 days Follow-up with neurology service in 4 weeks. Continue to follow-up with oncology service as previously instructed.   Increase activity slowly   Complete by: As directed      Allergies as of 10/20/2020      Reactions   Contrast Media [iodinated Diagnostic Agents] Anaphylaxis   Adverse reaction; pt hyperventilating, c/o CP and SOB   Lipitor [atorvastatin] Other (See Comments)   Muscle aches, cramps, fatigue, weight loss/poor appetite   Sulfamethoxazole Other (See Comments)   Reaction was years ago unknown   Sulfonamide Derivatives Other (See Comments)   Dizziness      Medication List    STOP taking these medications   palbociclib 75 MG tablet Commonly known as: Ibrance     TAKE these medications   acetaminophen 325 MG tablet Commonly known as: TYLENOL Take 2 tablets (650 mg total) by mouth every 6 (six) hours as needed for mild pain or headache (or temp > 37.5 C (99.5 F)).   anastrozole 1 MG tablet Commonly known as: ARIMIDEX Take 1 tablet (1 mg total) by mouth daily.   aspirin 81 MG EC tablet Take 2  tablets (162 mg total) by mouth daily. Swallow whole. Start taking on: October 21, 2020 What changed:   how much to take  when to take this  additional instructions   carboxymethylcellulose 0.5 % Soln Commonly known as: REFRESH PLUS Place 3-4 drops into both eyes 3 (three) times daily as needed (dry eyes). Preservative free   clopidogrel 75 MG tablet Commonly known as: PLAVIX Take 75 mg by mouth daily.   clorazepate 15 MG tablet Commonly known as: TRANXENE Take 15 mg by mouth daily.   escitalopram 10 MG tablet Commonly known as: LEXAPRO Take 1 tablet (10 mg total) by mouth daily.   ezetimibe 10 MG tablet Commonly known as: ZETIA Take 10 mg by mouth daily.   gabapentin 100 MG capsule Commonly known as: Neurontin Take 1 capsule (100 mg total) by mouth 3 (three) times daily.   HYDROcodone-acetaminophen 5-325 MG tablet Commonly known as: Norco Take 1 tablet by mouth every 4 (four) hours as needed for moderate pain.   latanoprost 0.005 % ophthalmic solution Commonly known as: XALATAN Place 1 drop into both eyes at bedtime.   metoprolol tartrate 25 MG tablet Commonly known as: LOPRESSOR Take 12.5 mg in a.m. and 25 mg at nighttime. What changed:   how much to take  how to take this  when to take this  additional instructions   nitroGLYCERIN 0.4 MG SL tablet Commonly known as: Nitrostat Place 1 tablet (0.4 mg total) under the tongue every 5 (five) minutes as needed.   pantoprazole 40 MG tablet Commonly known as: PROTONIX Take 1 tablet (40 mg total) by mouth daily before breakfast. Start taking on:  October 21, 2020      Allergies  Allergen Reactions  . Contrast Media [Iodinated Diagnostic Agents] Anaphylaxis    Adverse reaction; pt hyperventilating, c/o CP and SOB   . Lipitor [Atorvastatin] Other (See Comments)    Muscle aches, cramps, fatigue, weight loss/poor appetite  . Sulfamethoxazole Other (See Comments)    Reaction was years ago unknown  .  Sulfonamide Derivatives Other (See Comments)    Dizziness     Follow-up [facility].   Why:  RN/ PT they will call to set up first appointment       Celene Squibb, MD. Schedule an appointment as soon as possible for a visit in 10 day(s).   Specialty: Internal Medicine Contact information: [place] [address] [phone]        Troy Sine, MD .   Specialty: Cardiology Contact information: [address] [place] [address] [phone]        Phillips Odor, MD Follow up in 4 week(s).   Specialty: Neurology Contact information: [address] [place] [address] [phone]                The results of significant diagnostics from this hospitalization (including imaging, microbiology, ancillary and laboratory) are listed below for reference.    Significant Diagnostic Studies: MR ANGIO HEAD WO CONTRAST  Result Date: 10/20/2020 CLINICAL DATA:  TIA.  Episode of difficulty talking yesterday. EXAM: MRI HEAD WITHOUT CONTRAST MRA HEAD WITHOUT CONTRAST TECHNIQUE: Multiplanar, multiecho pulse sequences of the brain and surrounding structures were obtained without intravenous contrast. Angiographic images of the head were obtained using MRA technique without contrast. COMPARISON:  MRI head August 30, 21. FINDINGS: MRI HEAD FINDINGS Brain: No acute infarction, hemorrhage, hydrocephalus, extra-axial collection or mass lesion. Similar small remote lacunar infarcts in bilateral cerebellar hemispheres. Moderate scattered T2/FLAIR hyperintensities within the white matter, compatible with chronic microvascular ischemic disease. Skull and upper cervical spine: Normal marrow signal. Sinuses/Orbits: No acute findings.  Left scleral buckle. Other: Small right mastoid effusion. MRA HEAD FINDINGS Anterior circulation: Bilateral petrous and cavernous internal carotid arteries are patent. Bilateral  proximal MCA arteries are patent without evidence of proximal hemodynamically significant stenosis or large vessel occlusion. Patent bilateral ACA arteries. Possible focal stenosis of the right A3 ACA artery, although artifact in this region limits evaluation. No aneurysm is identified Posterior circulation: Bilateral vertebral arteries are patent. 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 Patent bilateral posterior cerebral arteries with bilateral posterior communicating arteries. Likely mild to moderate left P2 PCA narrowing. No evidence of aneurysm. IMPRESSION: 1. No evidence of acute intracranial abnormality.  No acute infarct. 2. No large vessel intracranial occlusion. Possible severe right A3 ACA stenosis, although evaluation is limited by artifact in this region. Additionally, there is suspected moderate stenosis of the mid basilar artery and mild-to-moderate left PCA stenosis. A CTA could better characterize if clinically indicated. Electronically Signed   By: Margaretha Sheffield MD   On: 10/20/2020 09:32   MR BRAIN WO CONTRAST  Result Date: 10/20/2020 CLINICAL DATA:  TIA.  Episode of difficulty talking yesterday. EXAM: MRI HEAD WITHOUT CONTRAST MRA HEAD WITHOUT CONTRAST TECHNIQUE: Multiplanar, multiecho pulse sequences of the brain and surrounding structures were obtained without intravenous contrast. Angiographic images of the head were obtained using MRA technique without contrast. COMPARISON:  MRI head August 30, 21. FINDINGS: MRI HEAD FINDINGS Brain: No acute infarction, hemorrhage, hydrocephalus, extra-axial  collection or mass lesion. Similar small remote lacunar infarcts in bilateral cerebellar hemispheres. Moderate scattered T2/FLAIR hyperintensities within the white matter, compatible with chronic microvascular ischemic disease. Skull and upper cervical spine: Normal marrow signal. Sinuses/Orbits: No acute findings.  Left scleral buckle. Other: Small right mastoid effusion. MRA HEAD FINDINGS  Anterior circulation: Bilateral petrous and cavernous internal carotid arteries are patent. Bilateral proximal MCA arteries are patent without evidence of proximal hemodynamically significant stenosis or large vessel occlusion. Patent bilateral ACA arteries. Possible focal stenosis of the right A3 ACA artery, although artifact in this region limits evaluation. No aneurysm is identified Posterior circulation: Bilateral vertebral arteries are patent. 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 Patent bilateral posterior cerebral arteries with bilateral posterior communicating arteries. Likely mild to moderate left P2 PCA narrowing. No evidence of aneurysm. IMPRESSION: 1. No evidence of acute intracranial abnormality.  No acute infarct. 2. No large vessel intracranial occlusion. Possible severe right A3 ACA stenosis, although evaluation is limited by artifact in this region. Additionally, there is suspected moderate stenosis of the mid basilar artery and mild-to-moderate left PCA stenosis. A CTA could better characterize if clinically indicated. Electronically Signed   By: Margaretha Sheffield MD   On: 10/20/2020 09:32   US Carotid Bilateral (at [facility] and [facility] only)  Result Date: 10/19/2020 CLINICAL DATA:  86 year old female with history of TIA EXAM: BILATERAL CAROTID DUPLEX ULTRASOUND TECHNIQUE: Pearline Cables scale imaging, color Doppler and duplex ultrasound were performed of bilateral carotid and vertebral arteries in the neck. COMPARISON:  None. FINDINGS: Criteria: Quantification of carotid stenosis is based on velocity parameters that correlate the residual internal carotid diameter with NASCET-based stenosis levels, using the diameter of the distal internal carotid lumen as the denominator for stenosis measurement. The following velocity measurements were obtained: RIGHT ICA: Peak systolic velocity 67 cm/sec, End diastolic velocity 16 cm/sec CCA: Peak systolic velocity 63 cm/sec SYSTOLIC ICA/CCA RATIO:  1.1 ECA: Peak  systolic velocity 993 cm/sec LEFT ICA: Peak systolic velocity 71 cm/sec, End diastolic velocity 23 cm/sec CCA: 83 cm/sec SYSTOLIC ICA/CCA RATIO:  0.9 ECA: 61 cm/sec RIGHT CAROTID ARTERY: Mild multifocal, circumferential atherosclerotic plaque formation. No significant tortuosity. Normal low resistance waveforms. RIGHT VERTEBRAL ARTERY:  Antegrade flow. LEFT CAROTID ARTERY: Mild multifocal, circumferential atherosclerotic plaque formation. No significant tortuosity. Normal low resistance waveforms. LEFT VERTEBRAL ARTERY:  Antegrade flow. Upper extremity non-invasive blood pressures: Not obtained. IMPRESSION: 1. Right carotid artery system: Less than 50% stenosis secondary to mild multifocal, circumferential atherosclerotic plaque formation. 2. Left carotid artery system: Less than 50% stenosis secondary to mild multifocal, circumferential atherosclerotic plaque formation. 3.  Vertebral artery system: Patent with antegrade flow bilaterally. Ruthann Cancer, MD [facility] Electronically Signed   By: Ruthann Cancer MD   On: 10/19/2020 14:27   DG Chest Port 1 View  Result Date: 10/19/2020 CLINICAL DATA:  Weakness.  History of breast cancer. EXAM: PORTABLE CHEST 1 VIEW COMPARISON:  Chest radiograph 02/21/2020. FINDINGS: Monitoring leads overlie the patient. Stable cardiac and mediastinal contours. Minimal heterogeneous opacities bilateral lung bases may represent atelectasis. Opacity within the right upper hemithorax compatible with destructive osseous metastasis and associated soft tissue lesions. Additionally there is progressed fracturing of the left second, fifth and seventh ribs, most compatible with pathologic fractures. Known thoracic osseous metastasis not well visualized on current portable exam. IMPRESSION: 1. Probable pathologic fractures of the left second, fifth and seventh ribs. 2. 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  3. Left greater than  right basilar atelectasis. Electronically Signed   By: Lovey Newcomer M.D.   On: 10/19/2020 08:27   ECHOCARDIOGRAM COMPLETE  Result Date: 10/19/2020    ECHOCARDIOGRAM REPORT   Patient Name:   Kara Woods Date of Exam: 10/19/2020 Medical Rec #:  [id number]        Height:       66.0 in Accession #:    [id number]       Weight:       162.6 lb Date of Birth:  Oct 09, 1933        BSA:          1.831 m Patient Age:    86 years         BP:           166/102 mmHg Patient Gender: F                HR:           119 bpm. Exam Location:  [facility] Procedure: 2D Echo Indications:    TIA 435.9 / G45.9  History:        Patient has prior history of Echocardiogram examinations, most                 recent 07/24/2020. CAD, TIA, Signs/Symptoms:Chest Pain; Risk                 Factors:Former Smoker, Dyslipidemia and Hypertension. Breast                 Cancer.  Sonographer:    Leavy Cella RDCS (AE) Referring Phys: [place]  1. Left ventricular ejection fraction, by estimation, is 60 to 65%. The left ventricle has normal function. The left ventricle has no regional wall motion abnormalities. Left ventricular diastolic parameters are consistent with Grade I diastolic dysfunction (impaired relaxation).  2. Right ventricular systolic function is normal. The right ventricular size is normal. There is normal pulmonary artery systolic pressure. The estimated right ventricular systolic pressure is 43.3 mmHg.  3. The mitral valve is normal in structure. Trivial mitral valve regurgitation. No evidence of mitral stenosis.  4. The aortic valve is normal in structure. Aortic valve regurgitation is not visualized. No aortic stenosis is present.  5. The inferior vena cava is normal in size with greater than 50% respiratory variability, suggesting right atrial pressure of 3 mmHg. Comparison(s): No significant change from prior study. Prior images reviewed side by side. FINDINGS  Left Ventricle: Left  ventricular ejection fraction, by estimation, is 60 to 65%. The left ventricle has normal function. The left ventricle has no regional wall motion abnormalities. The left ventricular internal cavity size was normal in size. There is  no left ventricular hypertrophy. Left ventricular diastolic parameters are consistent with Grade I diastolic dysfunction (impaired relaxation). Right Ventricle: The right ventricular size is normal. No increase in right ventricular wall thickness. Right ventricular systolic function is normal. There is normal pulmonary artery systolic pressure. The tricuspid regurgitant velocity is 2.58 m/s, and  with an assumed right atrial pressure of 3 mmHg, the estimated right ventricular systolic pressure is 29.5 mmHg. Left Atrium: Left atrial size was normal in size. Right Atrium: Right atrial size was normal in size. Pericardium: There is no evidence of pericardial effusion. Mitral Valve: The mitral valve is normal in structure. Trivial mitral valve regurgitation. No evidence of mitral valve stenosis. Tricuspid Valve: The tricuspid valve is normal in structure. Tricuspid valve regurgitation is trivial. No evidence of tricuspid  stenosis. Aortic Valve: The aortic valve is normal in structure. Aortic valve regurgitation is not visualized. No aortic stenosis is present. Pulmonic Valve: The pulmonic valve was normal in structure. Pulmonic valve regurgitation is not visualized. No evidence of pulmonic stenosis. Aorta: The aortic root is normal in size and structure. Venous: The inferior vena cava is normal in size with greater than 50% respiratory variability, suggesting right atrial pressure of 3 mmHg. IAS/Shunts: No atrial level shunt detected by color flow Doppler.  LEFT VENTRICLE PLAX 2D LVIDd:         3.84 cm  Diastology LVIDs:         2.76 cm  LV e' lateral:   5.55 cm/s LV PW:         1.34 cm  LV E/e' lateral: 9.4 LV IVS:        1.07 cm LVOT diam:     1.90 cm LVOT Area:     2.84 cm  RIGHT  VENTRICLE RV S prime:     8.27 cm/s TAPSE (M-mode): 1.5 cm LEFT ATRIUM             Index       RIGHT ATRIUM          Index LA diam:        2.80 cm 1.53 cm/m  RA Area:     9.57 cm LA Vol (A2C):   23.2 ml 12.67 ml/m RA Volume:   16.60 ml 9.06 ml/m LA Vol (A4C):   53.9 ml 29.43 ml/m LA Biplane Vol: 37.1 ml 20.26 ml/m   AORTA Ao Root diam: 3.00 cm MITRAL VALVE               TRICUSPID VALVE MV Area (PHT): 4.01 cm    TR Peak grad:   26.6 mmHg MV Decel Time: 189 msec    TR Vmax:        258.00 cm/s MV E velocity: 52.30 cm/s MV A velocity: 72.40 cm/s  SHUNTS MV E/A ratio:  0.72        Systemic Diam: 1.90 cm Candee Furbish MD Electronically signed by Candee Furbish MD Signature Date/Time: 10/19/2020/3:28:18 PM    Final    CT HEAD CODE STROKE WO CONTRAST  Result Date: 10/19/2020 CLINICAL DATA:  Code stroke.  Neuro deficit, acute, stroke suspected EXAM: CT HEAD WITHOUT CONTRAST TECHNIQUE: Contiguous axial images were obtained from the base of the skull through the vertex without intravenous contrast. COMPARISON:  07/24/2020 MRI head and prior. 07/24/2020 head CT and prior. FINDINGS: Brain: No acute infarct or intracranial hemorrhage. No mass lesion. No midline shift, ventriculomegaly or extra-axial fluid collection. Mild cerebral atrophy with ex vacuo dilatation. Chronic microvascular ischemic changes. Vascular: No hyperdense vessel or unexpected calcification. Bilateral skull base atherosclerotic calcifications. Skull: Negative for fracture or focal lesion. Sinuses/Orbits: No acute finding. Pneumatized paranasal sinuses and mastoid air cells. Other: None. ASPECTS [facility] Stroke Program Early CT Score) - Ganglionic level infarction (caudate, lentiform nuclei, internal capsule, insula, M1-M3 cortex): 7 - Supraganglionic infarction (M4-M6 cortex): 3 Total score (0-10 with 10 being normal): 10 IMPRESSION: 1. No evidence of acute intracranial abnormality. 2. ASPECTS is 10. 3. Mild cerebral atrophy and chronic microvascular  ischemic changes. Code stroke imaging results were communicated on 10/19/2020 at 7:14 am to provider Dr. Tomi Bamberger Via telephone, who verbally acknowledged these results. Electronically Signed   By: Primitivo Gauze M.D.   On: 10/19/2020 07:16    Microbiology: Recent Results (from the past 240 hour(s))  Resp  Panel by RT-PCR (Flu A&B, Covid) Nasopharyngeal Swab     Status: None   Collection Time: 10/19/20  9:38 AM   Specimen: Nasopharyngeal Swab; Nasopharyngeal(NP) swabs in vial transport medium  Result Value Ref Range Status   SARS Coronavirus 2 by RT PCR NEGATIVE NEGATIVE Final    Comment: (NOTE) SARS-CoV-2 target nucleic acids are NOT DETECTED.  The SARS-CoV-2 RNA is generally detectable in upper respiratory specimens during the acute phase of infection. The lowest concentration of SARS-CoV-2 viral copies this assay can detect is 138 copies/mL. A negative result does not preclude SARS-Cov-2 infection and should not be used as the sole basis for treatment or other patient management decisions. A negative result may occur with  improper specimen collection/handling, submission of specimen other than nasopharyngeal swab, presence of viral mutation(s) within the areas targeted by this assay, and inadequate number of viral copies(<138 copies/mL). A negative result must be combined with clinical observations, patient history, and epidemiological information. The expected result is Negative.  Fact Sheet for Patients:  EntrepreneurPulse.com.au  Fact Sheet for Healthcare Providers:  IncredibleEmployment.be  This test is no t yet approved or cleared by the [place] FDA and  has been authorized for detection and/or diagnosis of SARS-CoV-2 by FDA under an Emergency Use Authorization (EUA). This EUA will remain  in effect (meaning this test can be used) for the duration of the COVID-19 declaration under Section 564(b)(1) of the Act, 21 U.S.C.section  360bbb-3(b)(1), unless the authorization is terminated  or revoked sooner.       Influenza A by PCR NEGATIVE NEGATIVE Final   Influenza B by PCR NEGATIVE NEGATIVE Final    Comment: (NOTE) The Xpert Xpress SARS-CoV-2/FLU/RSV plus assay is intended as an aid in the diagnosis of influenza from Nasopharyngeal swab specimens and should not be used as a sole basis for treatment. Nasal washings and aspirates are unacceptable for Xpert Xpress SARS-CoV-2/FLU/RSV testing.  Fact Sheet for Patients: EntrepreneurPulse.com.au  Fact Sheet for Healthcare Providers: IncredibleEmployment.be  This test is not yet approved or cleared by the [place] FDA and has been authorized for detection and/or diagnosis of SARS-CoV-2 by FDA under an Emergency Use Authorization (EUA). This EUA will remain in effect (meaning this test can be used) for the duration of the COVID-19 declaration under Section 564(b)(1) of the Act, 21 U.S.C. section 360bbb-3(b)(1), unless the authorization is terminated or revoked.  Performed at [facility], [address], [place], [place] [address]      Labs: Basic Metabolic Panel: Recent Labs  Lab 10/19/20 0703 10/19/20 0714 10/20/20 0726  NA 137 141  --   K 4.4 4.4  --   CL 105 105  --   CO2 23  --   --   GLUCOSE 100* 101*  --   BUN 8 6*  --   CREATININE 0.57 0.60  --   CALCIUM 9.0  --   --   MG  --   --  2.2   Liver Function Tests: Recent Labs  Lab 10/19/20 0703  AST 16  ALT 11  ALKPHOS 61  BILITOT 0.6  PROT 7.7  ALBUMIN 4.3   CBC: Recent Labs  Lab 10/19/20 0703 10/19/20 0714  WBC 7.0  --   NEUTROABS 3.8  --   HGB 13.5 13.9  HCT 40.9 41.0  MCV 96.0  --   PLT 319  --     CBG: Recent Labs  Lab 10/19/20 0705 10/20/20 0616  GLUCAP 83 91  Signed:  Barton Dubois MD.  [facility] 10/20/2020, 3:55 PM

## 2020-10-20 NOTE — Evaluation (Signed)
Physical Therapy Evaluation Patient Details Name: Kara Woods MRN: [id number] DOB: 11-07-1933 Today's Date: 10/20/2020   History of Present Illness  David GRACIA SAGGESE is a 86 y.o. female with medical history significant of anxiety/depression, glaucoma/increase intraocular pressure, hypertension, hyperlipidemia, breast cancer, gastroesophageal reflux disease and chronic back pain; who presented to the emergency department after experiencing acute difficulty finding words and weakness.  Family reports to some babbling speech and unable to understand what the patient was trying to say.  There was no other focal neurologic deficit appreciated at that time.  Patient later expressed feeling generally weak and tired from her baseline.  There was no fever, no chest pain, no nausea, no vomiting, no dysuria, no hematuria, no abdominal pain, no chills, no sick contacts.    Clinical Impression  Patient limited for functional mobility as stated below secondary to BLE weakness, fatigue and impaired activity tolerance. Patient able to pull to seated EOB with HHA. She demonstrates good sitting balance and sitting tolerance EOB today. She transfers to standing with RW and min A secondary to LE weakness and is minimally unsteady upon standing initially. She ambulates with slow, labored cadence with use of RW without physical assistance. She is limited by fatiuge with ambulation. Discussed assistance able to be provided by family upon discharge with patient and daughter and they stated patient could have assistance at all times if needed. Patient will benefit from continued physical therapy in hospital and recommended venue below to increase strength, balance, endurance for safe ADLs and gait.     Follow Up Recommendations Home health PT;Supervision for mobility/OOB;Supervision/Assistance - 24 hour    Equipment Recommendations  Rolling walker with 5" wheels    Recommendations for Other Services        Precautions / Restrictions Precautions Precautions: Fall Restrictions Weight Bearing Restrictions: No      Mobility  Bed Mobility Overal bed mobility: Needs Assistance Bed Mobility: Supine to Sit;Sit to Supine     Supine to sit: Min guard Sit to supine: Supervision   General bed mobility comments: slightly slow transition to seated EOB, min g to pull to seated    Transfers Overall transfer level: Needs assistance Equipment used: Rolling walker (2 wheeled) Transfers: Sit to/from [facility] Sit to Stand: Min assist Stand pivot transfers: Min assist       General transfer comment: labored transition to standing with RW, assist for weakness, minimally unsteady upon initial standing  Ambulation/Gait Ambulation/Gait assistance: Min guard Gait Distance (Feet): 40 Feet Assistive device: Rolling walker (2 wheeled) Gait Pattern/deviations: Step-through pattern;Decreased step length - right;Decreased step length - left;Decreased stride length Gait velocity: decrased   General Gait Details: slow, labored cadence with use of RW, guard for safety/balance  Stairs            Wheelchair Mobility    Modified Rankin (Stroke Patients Only)       Balance Overall balance assessment: Needs assistance Sitting-balance support: No upper extremity supported Sitting balance-Leahy Scale: Normal Sitting balance - Comments: seated EOB   Standing balance support: Bilateral upper extremity supported Standing balance-Leahy Scale: Good Standing balance comment: good with RW                             Pertinent Vitals/Pain Pain Assessment: No/denies pain    Home Living Family/patient expects to be discharged to:: Private residence Living Arrangements: Alone Available Help at Discharge: Family;Available 24 hours/day Type of Home: [place]  Access: Stairs to enter Entrance Stairs-Rails: Right;Left;Can reach both Entrance Stairs-Number of Steps:  2 Home Layout: One level Home Equipment: None      Prior Function Level of Independence: Independent         Comments: Patient states independent without AD     Hand Dominance        Extremity/Trunk Assessment   Upper Extremity Assessment Upper Extremity Assessment: Generalized weakness    Lower Extremity Assessment Lower Extremity Assessment: Generalized weakness    Cervical / Trunk Assessment Cervical / Trunk Assessment: Kyphotic  Communication   Communication: No difficulties  Cognition Arousal/Alertness: Awake/alert Behavior During Therapy: WFL for tasks assessed/performed Overall Cognitive Status: Within Functional Limits for tasks assessed                                        General Comments      Exercises     Assessment/Plan    PT Assessment Patient needs continued PT services  PT Problem List Decreased strength;Decreased mobility;Decreased activity tolerance;Decreased balance;Decreased knowledge of use of DME       PT Treatment Interventions DME instruction;Therapeutic exercise;Gait training;Balance training;Neuromuscular re-education;Stair training;Functional mobility training;Therapeutic activities;Patient/family education    PT Goals (Current goals can be found in the Care Plan section)  Acute Rehab PT Goals Patient Stated Goal: return home with family to assist PT Goal Formulation: With patient/family Time For Goal Achievement: 10/27/20 Potential to Achieve Goals: Good    Frequency Min 3X/week   Barriers to discharge        Co-evaluation               AM-PAC PT "6 Clicks" Mobility  Outcome Measure Help needed turning from your back to your side while in a flat bed without using bedrails?: None Help needed moving from lying on your back to sitting on the side of a flat bed without using bedrails?: A Little Help needed moving to and from a bed to a chair (including a wheelchair)?: A Little Help needed standing  up from a chair using your arms (e.g., wheelchair or bedside chair)?: A Little Help needed to walk in hospital room?: A Little Help needed climbing 3-5 steps with a railing? : A Lot 6 Click Score: 18    End of Session Equipment Utilized During Treatment: Gait belt Activity Tolerance: Patient tolerated treatment well;Patient limited by fatigue Patient left: in bed;with call bell/phone within reach;with nursing/sitter in room;with family/visitor present Nurse Communication: Mobility status PT Visit Diagnosis: Unsteadiness on feet (R26.81);Other abnormalities of gait and mobility (R26.89);Muscle weakness (generalized) (M62.81)    Time: 2800-3491 PT Time Calculation (min) (ACUTE ONLY): 28 min   Charges:   PT Evaluation $PT Eval Moderate Complexity: 1 Mod PT Treatments $Therapeutic Activity: 23-37 mins        9:22 AM, 10/20/20 Mearl Latin PT, DPT Physical Therapist at [facility]

## 2020-10-20 NOTE — Care Management Obs Status (Signed)
[facility] NOTIFICATION   Patient Details  Name: Kara Woods MRN: [id number] Date of Birth: 01/22/1933   Medicare Observation Status Notification Given:  Yes    Boneta Lucks, RN 10/20/2020, 11:20 AM

## 2020-10-20 NOTE — Progress Notes (Signed)
Notified daughter Maudie Mercury about episode and updated on patient condition.

## 2020-10-21 DIAGNOSIS — Z7982 Long term (current) use of aspirin: Secondary | ICD-10-CM | POA: Diagnosis not present

## 2020-10-21 DIAGNOSIS — F32A Depression, unspecified: Secondary | ICD-10-CM | POA: Diagnosis not present

## 2020-10-21 DIAGNOSIS — K219 Gastro-esophageal reflux disease without esophagitis: Secondary | ICD-10-CM | POA: Diagnosis not present

## 2020-10-21 DIAGNOSIS — Z853 Personal history of malignant neoplasm of breast: Secondary | ICD-10-CM | POA: Diagnosis not present

## 2020-10-21 DIAGNOSIS — G629 Polyneuropathy, unspecified: Secondary | ICD-10-CM | POA: Diagnosis not present

## 2020-10-21 DIAGNOSIS — Z79891 Long term (current) use of opiate analgesic: Secondary | ICD-10-CM | POA: Diagnosis not present

## 2020-10-21 DIAGNOSIS — G459 Transient cerebral ischemic attack, unspecified: Secondary | ICD-10-CM | POA: Diagnosis not present

## 2020-10-21 DIAGNOSIS — M549 Dorsalgia, unspecified: Secondary | ICD-10-CM | POA: Diagnosis not present

## 2020-10-21 DIAGNOSIS — E785 Hyperlipidemia, unspecified: Secondary | ICD-10-CM | POA: Diagnosis not present

## 2020-10-21 DIAGNOSIS — I10 Essential (primary) hypertension: Secondary | ICD-10-CM | POA: Diagnosis not present

## 2020-10-21 DIAGNOSIS — I2510 Atherosclerotic heart disease of native coronary artery without angina pectoris: Secondary | ICD-10-CM | POA: Diagnosis not present

## 2020-10-21 DIAGNOSIS — H409 Unspecified glaucoma: Secondary | ICD-10-CM | POA: Diagnosis not present

## 2020-10-21 DIAGNOSIS — F419 Anxiety disorder, unspecified: Secondary | ICD-10-CM | POA: Diagnosis not present

## 2020-10-21 DIAGNOSIS — Z8673 Personal history of transient ischemic attack (TIA), and cerebral infarction without residual deficits: Secondary | ICD-10-CM | POA: Diagnosis not present

## 2020-10-21 DIAGNOSIS — Z7902 Long term (current) use of antithrombotics/antiplatelets: Secondary | ICD-10-CM | POA: Diagnosis not present

## 2020-10-21 DIAGNOSIS — C7951 Secondary malignant neoplasm of bone: Secondary | ICD-10-CM | POA: Diagnosis not present

## 2020-10-24 ENCOUNTER — Other Ambulatory Visit (HOSPITAL_COMMUNITY): Payer: Self-pay | Admitting: *Deleted

## 2020-10-24 DIAGNOSIS — I10 Essential (primary) hypertension: Secondary | ICD-10-CM | POA: Diagnosis not present

## 2020-10-24 DIAGNOSIS — C7951 Secondary malignant neoplasm of bone: Secondary | ICD-10-CM | POA: Diagnosis not present

## 2020-10-24 DIAGNOSIS — G629 Polyneuropathy, unspecified: Secondary | ICD-10-CM | POA: Diagnosis not present

## 2020-10-24 DIAGNOSIS — I2510 Atherosclerotic heart disease of native coronary artery without angina pectoris: Secondary | ICD-10-CM | POA: Diagnosis not present

## 2020-10-24 DIAGNOSIS — E785 Hyperlipidemia, unspecified: Secondary | ICD-10-CM | POA: Diagnosis not present

## 2020-10-24 DIAGNOSIS — M549 Dorsalgia, unspecified: Secondary | ICD-10-CM | POA: Diagnosis not present

## 2020-10-24 MED ORDER — PALBOCICLIB 75 MG PO TABS
75.0000 mg | ORAL_TABLET | Freq: Every day | ORAL | 3 refills | Status: DC
Start: 2020-10-24 — End: 2020-10-25

## 2020-10-25 ENCOUNTER — Other Ambulatory Visit (HOSPITAL_COMMUNITY): Payer: Medicare Other

## 2020-10-25 ENCOUNTER — Ambulatory Visit (HOSPITAL_COMMUNITY): Payer: Medicare Other | Admitting: Hematology

## 2020-10-25 ENCOUNTER — Other Ambulatory Visit (HOSPITAL_COMMUNITY): Payer: Self-pay | Admitting: *Deleted

## 2020-10-25 MED ORDER — PALBOCICLIB 75 MG PO TABS
75.0000 mg | ORAL_TABLET | Freq: Every day | ORAL | 3 refills | Status: DC
Start: 2020-10-25 — End: 2021-04-25

## 2020-10-26 DIAGNOSIS — I10 Essential (primary) hypertension: Secondary | ICD-10-CM | POA: Diagnosis not present

## 2020-10-26 DIAGNOSIS — I2510 Atherosclerotic heart disease of native coronary artery without angina pectoris: Secondary | ICD-10-CM | POA: Diagnosis not present

## 2020-10-26 DIAGNOSIS — E785 Hyperlipidemia, unspecified: Secondary | ICD-10-CM | POA: Diagnosis not present

## 2020-10-26 DIAGNOSIS — G629 Polyneuropathy, unspecified: Secondary | ICD-10-CM | POA: Diagnosis not present

## 2020-10-26 DIAGNOSIS — C7951 Secondary malignant neoplasm of bone: Secondary | ICD-10-CM | POA: Diagnosis not present

## 2020-10-26 DIAGNOSIS — M549 Dorsalgia, unspecified: Secondary | ICD-10-CM | POA: Diagnosis not present

## 2020-10-27 ENCOUNTER — Other Ambulatory Visit (HOSPITAL_COMMUNITY): Payer: Self-pay

## 2020-10-27 MED ORDER — HYDROCODONE-ACETAMINOPHEN 5-325 MG PO TABS: 1.0000 | ORAL_TABLET | ORAL | 0 refills | Status: DC | PRN

## 2020-10-30 DIAGNOSIS — M549 Dorsalgia, unspecified: Secondary | ICD-10-CM | POA: Diagnosis not present

## 2020-10-30 DIAGNOSIS — I2510 Atherosclerotic heart disease of native coronary artery without angina pectoris: Secondary | ICD-10-CM | POA: Diagnosis not present

## 2020-10-30 DIAGNOSIS — C7951 Secondary malignant neoplasm of bone: Secondary | ICD-10-CM | POA: Diagnosis not present

## 2020-10-30 DIAGNOSIS — E785 Hyperlipidemia, unspecified: Secondary | ICD-10-CM | POA: Diagnosis not present

## 2020-10-30 DIAGNOSIS — I10 Essential (primary) hypertension: Secondary | ICD-10-CM | POA: Diagnosis not present

## 2020-10-30 DIAGNOSIS — G629 Polyneuropathy, unspecified: Secondary | ICD-10-CM | POA: Diagnosis not present

## 2020-11-01 DIAGNOSIS — G629 Polyneuropathy, unspecified: Secondary | ICD-10-CM | POA: Diagnosis not present

## 2020-11-01 DIAGNOSIS — I10 Essential (primary) hypertension: Secondary | ICD-10-CM | POA: Diagnosis not present

## 2020-11-01 DIAGNOSIS — E785 Hyperlipidemia, unspecified: Secondary | ICD-10-CM | POA: Diagnosis not present

## 2020-11-01 DIAGNOSIS — M549 Dorsalgia, unspecified: Secondary | ICD-10-CM | POA: Diagnosis not present

## 2020-11-01 DIAGNOSIS — I2510 Atherosclerotic heart disease of native coronary artery without angina pectoris: Secondary | ICD-10-CM | POA: Diagnosis not present

## 2020-11-01 DIAGNOSIS — C7951 Secondary malignant neoplasm of bone: Secondary | ICD-10-CM | POA: Diagnosis not present

## 2020-11-07 DIAGNOSIS — C7951 Secondary malignant neoplasm of bone: Secondary | ICD-10-CM | POA: Diagnosis not present

## 2020-11-07 DIAGNOSIS — I2510 Atherosclerotic heart disease of native coronary artery without angina pectoris: Secondary | ICD-10-CM | POA: Diagnosis not present

## 2020-11-07 DIAGNOSIS — I10 Essential (primary) hypertension: Secondary | ICD-10-CM | POA: Diagnosis not present

## 2020-11-07 DIAGNOSIS — G629 Polyneuropathy, unspecified: Secondary | ICD-10-CM | POA: Diagnosis not present

## 2020-11-07 DIAGNOSIS — M549 Dorsalgia, unspecified: Secondary | ICD-10-CM | POA: Diagnosis not present

## 2020-11-07 DIAGNOSIS — E785 Hyperlipidemia, unspecified: Secondary | ICD-10-CM | POA: Diagnosis not present

## 2020-11-09 ENCOUNTER — Inpatient Hospital Stay (HOSPITAL_COMMUNITY): Payer: Medicare Other

## 2020-11-09 ENCOUNTER — Other Ambulatory Visit: Payer: Self-pay

## 2020-11-09 ENCOUNTER — Inpatient Hospital Stay (HOSPITAL_COMMUNITY): Payer: Medicare Other | Attending: Hematology and Oncology

## 2020-11-09 VITALS — BP 114/54 | HR 80 | Temp 97.0°F | Resp 20

## 2020-11-09 DIAGNOSIS — Z923 Personal history of irradiation: Secondary | ICD-10-CM | POA: Insufficient documentation

## 2020-11-09 DIAGNOSIS — C50919 Malignant neoplasm of unspecified site of unspecified female breast: Secondary | ICD-10-CM

## 2020-11-09 DIAGNOSIS — Z170 Estrogen receptor positive status [ER+]: Secondary | ICD-10-CM | POA: Insufficient documentation

## 2020-11-09 DIAGNOSIS — I2510 Atherosclerotic heart disease of native coronary artery without angina pectoris: Secondary | ICD-10-CM | POA: Diagnosis not present

## 2020-11-09 DIAGNOSIS — F32A Depression, unspecified: Secondary | ICD-10-CM | POA: Diagnosis not present

## 2020-11-09 DIAGNOSIS — C7951 Secondary malignant neoplasm of bone: Secondary | ICD-10-CM | POA: Insufficient documentation

## 2020-11-09 DIAGNOSIS — C50212 Malignant neoplasm of upper-inner quadrant of left female breast: Secondary | ICD-10-CM | POA: Diagnosis not present

## 2020-11-09 DIAGNOSIS — Z853 Personal history of malignant neoplasm of breast: Secondary | ICD-10-CM

## 2020-11-09 DIAGNOSIS — Z87891 Personal history of nicotine dependence: Secondary | ICD-10-CM | POA: Insufficient documentation

## 2020-11-09 LAB — COMPREHENSIVE METABOLIC PANEL
ALT: 14 U/L (ref 0–44)
AST: 19 U/L (ref 15–41)
Albumin: 3.9 g/dL (ref 3.5–5.0)
Alkaline Phosphatase: 54 U/L (ref 38–126)
Anion gap: 8 (ref 5–15)
BUN: 14 mg/dL (ref 8–23)
CO2: 25 mmol/L (ref 22–32)
Calcium: 8.8 mg/dL — ABNORMAL LOW (ref 8.9–10.3)
Chloride: 104 mmol/L (ref 98–111)
Creatinine, Ser: 0.68 mg/dL (ref 0.44–1.00)
GFR, Estimated: >60 mL/min (ref >60)
Glucose, Bld: 106 mg/dL — ABNORMAL HIGH (ref 70–99)
Potassium: 4.4 mmol/L (ref 3.5–5.1)
Sodium: 137 mmol/L (ref 135–145)
Total Bilirubin: 0.6 mg/dL (ref 0.3–1.2)
Total Protein: 6.9 g/dL (ref 6.5–8.1)

## 2020-11-09 MED ORDER — DENOSUMAB 120 MG/1.7ML ~~LOC~~ SOLN
120.0000 mg | Freq: Once | SUBCUTANEOUS | Status: AC
  Administered 2020-11-09: 120 mg via SUBCUTANEOUS
  Filled 2020-11-09: qty 1.7

## 2020-11-09 NOTE — Progress Notes (Signed)
Patient tolerated injection with no complaints voiced.  Site clean and dry with no bruising or swelling noted.  No complaints of pain.  Discharged with vital signs stable and no signs or symptoms of distress noted.   

## 2020-11-10 ENCOUNTER — Telehealth (HOSPITAL_COMMUNITY): Payer: Self-pay

## 2020-11-10 DIAGNOSIS — I10 Essential (primary) hypertension: Secondary | ICD-10-CM | POA: Diagnosis not present

## 2020-11-10 DIAGNOSIS — E785 Hyperlipidemia, unspecified: Secondary | ICD-10-CM | POA: Diagnosis not present

## 2020-11-10 DIAGNOSIS — M549 Dorsalgia, unspecified: Secondary | ICD-10-CM | POA: Diagnosis not present

## 2020-11-10 DIAGNOSIS — I2510 Atherosclerotic heart disease of native coronary artery without angina pectoris: Secondary | ICD-10-CM | POA: Diagnosis not present

## 2020-11-10 DIAGNOSIS — G629 Polyneuropathy, unspecified: Secondary | ICD-10-CM | POA: Diagnosis not present

## 2020-11-10 DIAGNOSIS — C7951 Secondary malignant neoplasm of bone: Secondary | ICD-10-CM | POA: Diagnosis not present

## 2020-11-10 NOTE — Telephone Encounter (Signed)
Nutrition Assessment:  Patient identified on Malnutrition Screening report for weight loss.  86 year old female with metastatic breast cancer.  Past medical history of HTN, GERD, HLD, CAD Patient on anastrozole and ibrance currently held.  Noted recent hospital visit.  Spoke with patient's daughter Maudie Mercury as only number listed in chart.  Kim reports that patient's appetite is good.  Lives by herself and cooks for herself. Daughter also reports that appetite has improved after getting depression under control.  Eats good sources of protein (chicken, eggs, yogurt).     Medications: reviewed  Labs: reviewed  Anthropometrics:   Height: 66 inches Weight: 144 lb on 11/25 (hospital weight) Daughter does not think this weight is accurate Low 160s BMI: 23  Last weight at cancer center 157 lb Relatively stable  NUTRITION DIAGNOSIS: none at this time   INTERVENTION:  Encouraged good sources of protein and nutrition overall  Contact information emailed to daughter if has further questions or concerns.     NEXT VISIT: no follow-up, daughter to contact if needed in the future  Avana Kreiser B. Zenia Resides, [place], [place] Registered Dietitian [phone] (mobile)

## 2020-11-14 DIAGNOSIS — I2510 Atherosclerotic heart disease of native coronary artery without angina pectoris: Secondary | ICD-10-CM | POA: Diagnosis not present

## 2020-11-14 DIAGNOSIS — M549 Dorsalgia, unspecified: Secondary | ICD-10-CM | POA: Diagnosis not present

## 2020-11-14 DIAGNOSIS — E785 Hyperlipidemia, unspecified: Secondary | ICD-10-CM | POA: Diagnosis not present

## 2020-11-14 DIAGNOSIS — G629 Polyneuropathy, unspecified: Secondary | ICD-10-CM | POA: Diagnosis not present

## 2020-11-14 DIAGNOSIS — C7951 Secondary malignant neoplasm of bone: Secondary | ICD-10-CM | POA: Diagnosis not present

## 2020-11-14 DIAGNOSIS — I10 Essential (primary) hypertension: Secondary | ICD-10-CM | POA: Diagnosis not present

## 2020-11-20 DIAGNOSIS — E785 Hyperlipidemia, unspecified: Secondary | ICD-10-CM | POA: Diagnosis not present

## 2020-11-20 DIAGNOSIS — F419 Anxiety disorder, unspecified: Secondary | ICD-10-CM | POA: Diagnosis not present

## 2020-11-20 DIAGNOSIS — I2510 Atherosclerotic heart disease of native coronary artery without angina pectoris: Secondary | ICD-10-CM | POA: Diagnosis not present

## 2020-11-20 DIAGNOSIS — F32A Depression, unspecified: Secondary | ICD-10-CM | POA: Diagnosis not present

## 2020-11-20 DIAGNOSIS — Z7982 Long term (current) use of aspirin: Secondary | ICD-10-CM | POA: Diagnosis not present

## 2020-11-20 DIAGNOSIS — Z7902 Long term (current) use of antithrombotics/antiplatelets: Secondary | ICD-10-CM | POA: Diagnosis not present

## 2020-11-20 DIAGNOSIS — Z853 Personal history of malignant neoplasm of breast: Secondary | ICD-10-CM | POA: Diagnosis not present

## 2020-11-20 DIAGNOSIS — G629 Polyneuropathy, unspecified: Secondary | ICD-10-CM | POA: Diagnosis not present

## 2020-11-20 DIAGNOSIS — K219 Gastro-esophageal reflux disease without esophagitis: Secondary | ICD-10-CM | POA: Diagnosis not present

## 2020-11-20 DIAGNOSIS — I10 Essential (primary) hypertension: Secondary | ICD-10-CM | POA: Diagnosis not present

## 2020-11-20 DIAGNOSIS — M549 Dorsalgia, unspecified: Secondary | ICD-10-CM | POA: Diagnosis not present

## 2020-11-20 DIAGNOSIS — Z8673 Personal history of transient ischemic attack (TIA), and cerebral infarction without residual deficits: Secondary | ICD-10-CM | POA: Diagnosis not present

## 2020-11-20 DIAGNOSIS — Z79891 Long term (current) use of opiate analgesic: Secondary | ICD-10-CM | POA: Diagnosis not present

## 2020-11-20 DIAGNOSIS — C7951 Secondary malignant neoplasm of bone: Secondary | ICD-10-CM | POA: Diagnosis not present

## 2020-11-20 DIAGNOSIS — H409 Unspecified glaucoma: Secondary | ICD-10-CM | POA: Diagnosis not present

## 2020-11-22 DIAGNOSIS — N390 Urinary tract infection, site not specified: Secondary | ICD-10-CM | POA: Diagnosis not present

## 2020-11-22 DIAGNOSIS — C50212 Malignant neoplasm of upper-inner quadrant of left female breast: Secondary | ICD-10-CM | POA: Diagnosis not present

## 2020-11-22 DIAGNOSIS — C7951 Secondary malignant neoplasm of bone: Secondary | ICD-10-CM | POA: Diagnosis not present

## 2020-11-23 ENCOUNTER — Other Ambulatory Visit (HOSPITAL_COMMUNITY): Payer: Self-pay | Admitting: Hematology

## 2020-11-23 DIAGNOSIS — I2510 Atherosclerotic heart disease of native coronary artery without angina pectoris: Secondary | ICD-10-CM | POA: Diagnosis not present

## 2020-11-23 DIAGNOSIS — G629 Polyneuropathy, unspecified: Secondary | ICD-10-CM | POA: Diagnosis not present

## 2020-11-23 DIAGNOSIS — M549 Dorsalgia, unspecified: Secondary | ICD-10-CM | POA: Diagnosis not present

## 2020-11-23 DIAGNOSIS — C7951 Secondary malignant neoplasm of bone: Secondary | ICD-10-CM | POA: Diagnosis not present

## 2020-11-23 DIAGNOSIS — I10 Essential (primary) hypertension: Secondary | ICD-10-CM | POA: Diagnosis not present

## 2020-11-23 DIAGNOSIS — E785 Hyperlipidemia, unspecified: Secondary | ICD-10-CM | POA: Diagnosis not present

## 2020-11-28 DIAGNOSIS — M549 Dorsalgia, unspecified: Secondary | ICD-10-CM | POA: Diagnosis not present

## 2020-11-28 DIAGNOSIS — C7951 Secondary malignant neoplasm of bone: Secondary | ICD-10-CM | POA: Diagnosis not present

## 2020-11-28 DIAGNOSIS — E785 Hyperlipidemia, unspecified: Secondary | ICD-10-CM | POA: Diagnosis not present

## 2020-11-28 DIAGNOSIS — I10 Essential (primary) hypertension: Secondary | ICD-10-CM | POA: Diagnosis not present

## 2020-11-28 DIAGNOSIS — G629 Polyneuropathy, unspecified: Secondary | ICD-10-CM | POA: Diagnosis not present

## 2020-11-28 DIAGNOSIS — I2510 Atherosclerotic heart disease of native coronary artery without angina pectoris: Secondary | ICD-10-CM | POA: Diagnosis not present

## 2020-12-06 NOTE — Progress Notes (Signed)
Kara Woods, Kara Woods   CLINIC:  Medical Oncology/Hematology  PCP:  Celene Squibb, MD [address] [place] [address] [phone]   REASON FOR VISIT:  Follow-up for metastatic left breast cancer  PRIOR THERAPY:  1. Left lumpectomy in 04/2013. 2. Radiation therapy.  NGS Results: ER/PR positive, HER-2 negative, Ki-67 10%  CURRENT THERAPY: Arimidex QD; Ibrance 100 mg 3 weeks on, 1 week off; Xgeva monthly  BRIEF ONCOLOGIC HISTORY:  Oncology History   No history exists.    CANCER STAGING: Cancer Staging History of breast cancer Staging form: Breast, AJCC 7th Edition - Clinical stage from 04/20/2013: Stage IIA (T2, N0, cM0) - Unsigned - Pathologic: No stage assigned - Unsigned   INTERVAL HISTORY:  Kara Woods, a 87 y.o. female, returns for routine follow-up of her metastatic left breast cancer. Kara Woods was last seen on 10/12/2020.   Today she reports that she has been feeling fairly well.  Appetite is 100%.  Energy levels are slightly low.  She reports right hip pain which is more prominent in the last few days and is taking hydrocodone as needed.  Her daughter is accompanying her today.  She reports that overall her mood, depression and eating has improved.  She is currently taking Arimidex in the mornings.  She was admitted to the hospital with a TIA on 10/19/2020 as she could not talk briefly.  She is continuing Lexapro and takes clorazepate as needed.  REVIEW OF SYSTEMS:  Review of Systems  Musculoskeletal: Positive for arthralgias.  All other systems reviewed and are negative.   PAST MEDICAL/SURGICAL HISTORY:  Past Medical History:  Diagnosis Date  . Anxiety   . Arthritis   . Blind left eye   . Breast cancer ([facility])    left   . Colitis   . Coronary artery disease    a. 10/2016 Staged PCI: RCA 50p, 82m(2.25x12 Resolute Onyx DES), LCX 50p, 873m2.75x23 Xience Alpine DES). Residual LAD 50p/m, 458m   . CMarland Kitchenstocele 10/11/2013  . Depression   . Diastolic dysfunction    a. 09/2016 Echo: EF 50%, diffuse hypokinesis, mild LVH, grade 1 diastolic dysfunction, mild mitral regurgitation, mildly dilated left atrium.  . GMarland KitchenRD (gastroesophageal reflux disease)    occasional Tums only  . HOH (hard of hearing)   . Pelvic relaxation 10/11/2013  . Proctitis    colonsocopy 2007, Canasa suppositories  . S/P endoscopy March 2009   mild erosive reflux esophagitis, Schatzki's ring, s/p dilation  . Schatzki's ring   . Shortness of breath    occ if anxiety  . Wears dentures    upper  . Wears glasses    Past Surgical History:  Procedure Laterality Date  . ABDOMINAL HYSTERECTOMY    . ANTERIOR AND POSTERIOR REPAIR N/A 05/17/2014   Procedure: ANTERIOR (CYSTOCELE) AND POSTERIOR REPAIR (RECTOCELE);  Surgeon: ScoReece PackerD;  [facility];  Service: Urology;  Laterality: N/A;  . APPENDECTOMY    . BREAST LUMPECTOMY WITH NEEDLE LOCALIZATION AND AXILLARY SENTINEL LYMPH NODE BX Left 05/03/2013   Procedure: BREAST LUMPECTOMY WITH NEEDLE LOCALIZATION AND AXILLARY SENTINEL LYMPH NODE BX;  Surgeon: BenEdward JollyD;  [facility];  Laterality: Left;  . BREAST SURGERY Left 05/19/13  . CARDIAC CATHETERIZATION  1998  . CARDIAC CATHETERIZATION N/A 11/04/2016   Procedure: Left Heart Cath and Coronary Angiography;  Surgeon: ThoTroy SineD;  Location: [facility];  Service: Cardiovascular;  Laterality: N/A;  . CARDIAC CATHETERIZATION N/A 11/05/2016   Procedure: Coronary Stent Intervention;  Surgeon: Troy Sine, MD;  Location: [facility];  Service: Cardiovascular;  Laterality: N/A;  . CHOLECYSTECTOMY    . COLONOSCOPY  05/06/2006   Diffuse inflammatory changes of the rectal mucosa, consistent  with proctitis.  Otherwise, normal colon to terminal ileum  . CORONARY ANGIOPLASTY  11/04/2016  . CORONARY STENT PLACEMENT     Drug-eluting coronary  artery stent, non-bioabsorbable-polymer-coated  . CYSTOSCOPY N/A 05/17/2014   Procedure: CYSTOSCOPY;  Surgeon: Reece Packer, MD;  Location: [name];  Service: Urology;  Laterality: N/A;  . ESOPHAGOGASTRODUODENOSCOPY  02/09/2008   Schatzki ring status post dilation/Distal esophageal erosion consistent with mild erosive reflux  esophagitis, otherwise unremarkable esophagus, normal stomach, D1, D2.  . EYE SURGERY     lt cataract-implant  . EYE SURGERY     lt-lens repaced,l  . [place] SURGERY  1993  . RE-EXCISION OF BREAST LUMPECTOMY Left 05/19/2013   Procedure: RE-EXCISION OF BREAST LUMPECTOMY;  Surgeon: Edward Jolly, MD;  Location: [facility];  Service: General;  Laterality: Left;  . RETINAL DETACHMENT SURGERY  1978   Left  . VAGINAL HYSTERECTOMY Bilateral 05/17/2014   Procedure: HYSTERECTOMY VAGINAL with Bilateral Salpingo-Oophorectomy; Bladder cystotomy repair;  Surgeon: Marvene Staff, MD;  Location: [facility];  Service: Gynecology;  Laterality: Bilateral;  . VAGINAL PROLAPSE REPAIR N/A 05/17/2014   Procedure: VAGINAL VAULT PROLAPSE AND GRAFT;  Surgeon: Reece Packer, MD;  Location: [facility];  Service: Urology;  Laterality: N/A;    SOCIAL HISTORY:  Social History   Socioeconomic History  . Marital status: Widowed    Spouse name: Not on file  . Number of children: 6  . Years of education: Not on file  . Highest education level: Not on file  Occupational History  . Occupation: Retired  Tobacco Use  . Smoking status: Former Smoker    Packs/day: 1.00    Types: Cigarettes    Quit date: 04/28/1990    Years since quitting: 30.6  . Smokeless tobacco: Never Used  . Tobacco comment: quit 28 yrs ago  Substance and Sexual Activity  . Alcohol use: Yes    Alcohol/week: 1.0 standard drink    Types: 1 Glasses of wine per week    Comment: occ  . Drug use: No  . Sexual activity: Not Currently    Birth control/protection: Post-menopausal  Other Topics  Concern  . Not on file  Social History Narrative  . Not on file   Social Determinants of Health   Financial Resource Strain: Low Risk   . Difficulty of Paying Living Expenses: Not hard at all  Food Insecurity: No Food Insecurity  . Worried About Charity fundraiser in the Last Year: Never true  . Ran Out of Food in the Last Year: Never true  Transportation Needs: No Transportation Needs  . Lack of Transportation (Medical): No  . Lack of Transportation (Non-Medical): No  Physical Activity: Insufficiently Active  . Days of Exercise per Week: 2 days  . Minutes of Exercise per Session: 10 min  Stress: No Stress Concern Present  . Feeling of Stress : Only a little  Social Connections: Socially Isolated  . Frequency of Communication with Friends and Family: More than three times a week  . Frequency of Social Gatherings with Friends and Family: More than three times a week  .  Attends Religious Services: Never  . Active Member of Clubs or Organizations: No  . Attends Archivist Meetings: Never  . Marital Status: Widowed  Intimate Partner Violence: Not At Risk  . Fear of Current or Ex-Partner: No  . Emotionally Abused: No  . Physically Abused: No  . Sexually Abused: No    FAMILY HISTORY:  Family History  Problem Relation Age of Onset  . Cancer Brother        spinal  . Heart attack Mother   . Heart attack Father   . Heart attack Son   . Heart attack Son   . Depression Son   . Multiple myeloma Daughter   . Anemia Daughter   . Colon cancer Neg Hx     CURRENT MEDICATIONS:  Current Outpatient Medications  Medication Sig Dispense Refill  . acetaminophen (TYLENOL) 325 MG tablet Take 2 tablets (650 mg total) by mouth every 6 (six) hours as needed for mild pain or headache (or temp > 37.5 C (99.5 F)). 40 tablet 0  . anastrozole (ARIMIDEX) 1 MG tablet TAKE 1 TABLET BY MOUTH EVERY DAY 90 tablet 2  . aspirin EC 81 MG EC tablet Take 2 tablets (162 mg total) by mouth daily.  Swallow whole. 60 tablet 2  . carboxymethylcellulose (REFRESH PLUS) 0.5 % SOLN Place 3-4 drops into both eyes 3 (three) times daily as needed (dry eyes). Preservative free    . clopidogrel (PLAVIX) 75 MG tablet Take 75 mg by mouth daily.     . clorazepate (TRANXENE) 15 MG tablet Take 15 mg by mouth daily.     Marland Kitchen escitalopram (LEXAPRO) 10 MG tablet Take 1 tablet (10 mg total) by mouth daily. 30 tablet 2  . ezetimibe (ZETIA) 10 MG tablet Take 10 mg by mouth daily.    Marland Kitchen gabapentin (NEURONTIN) 100 MG capsule Take 1 capsule (100 mg total) by mouth 3 (three) times daily. 90 capsule 2  . HYDROcodone-acetaminophen (NORCO) 5-325 MG tablet Take 1 tablet by mouth every 4 (four) hours as needed for moderate pain. 180 tablet 0  . latanoprost (XALATAN) 0.005 % ophthalmic solution Place 1 drop into both eyes at bedtime.    . metoprolol tartrate (LOPRESSOR) 25 MG tablet Take 12.5 mg in a.m. and 25 mg at nighttime.    . nitroGLYCERIN (NITROSTAT) 0.4 MG SL tablet Place 1 tablet (0.4 mg total) under the tongue every 5 (five) minutes as needed. 25 tablet 3  . palbociclib (IBRANCE) 75 MG tablet Take 1 tablet (75 mg total) by mouth daily. Take for 21 days on, 7 days off, repeat every 28 days. (Patient not taking: Reported on 11/09/2020) 21 tablet 3  . pantoprazole (PROTONIX) 40 MG tablet Take 1 tablet (40 mg total) by mouth daily before breakfast. 30 tablet 1   No current facility-administered medications for this visit.    ALLERGIES:  Allergies  Allergen Reactions  . Contrast Media [Iodinated Diagnostic Agents] Anaphylaxis    Adverse reaction; pt hyperventilating, c/o CP and SOB   . Lipitor [Atorvastatin] Other (See Comments)    Muscle aches, cramps, fatigue, weight loss/poor appetite  . Sulfamethoxazole Other (See Comments)    Reaction was years ago unknown  . Sulfonamide Derivatives Other (See Comments)    Dizziness     PHYSICAL EXAM:  Performance status (ECOG): 1 - Symptomatic but completely  ambulatory  There were no vitals filed for this visit. Wt Readings from Last 3 Encounters:  10/19/20 144 lb 10 oz (65.6 kg)  10/12/20 157 lb 9.6 oz (71.5 kg)  09/27/20 157 lb 3.2 oz (71.3 kg)   Physical Exam Vitals reviewed.  Constitutional:      Appearance: Normal appearance.  Cardiovascular:     Rate and Rhythm: Regular rhythm.     Pulses: Normal pulses.     Heart sounds: Normal heart sounds.  Pulmonary:     Effort: Pulmonary effort is normal.     Breath sounds: Normal breath sounds.  Abdominal:     Palpations: Abdomen is soft.  Musculoskeletal:        General: No swelling.  Neurological:     Mental Status: She is alert and oriented to person, place, and time.  Psychiatric:        Mood and Affect: Mood normal.        Behavior: Behavior normal.      LABORATORY DATA:  I have reviewed the labs as listed.  CBC Latest Ref Rng & Units 10/19/2020 10/19/2020 10/11/2020  WBC 4.0 - 10.5 K/uL - 7.0 7.3  Hemoglobin 12.0 - 15.0 g/dL 13.9 13.5 13.2  Hematocrit 36.0 - 46.0 % 41.0 40.9 41.3  Platelets 150 - 400 K/uL - 319 356   CMP Latest Ref Rng & Units 11/09/2020 10/19/2020 10/19/2020  Glucose 70 - 99 mg/dL 106(H) 101(H) 100(H)  BUN 8 - 23 mg/dL 14 6(L) 8  Creatinine 0.44 - 1.00 mg/dL 0.68 0.60 0.57  Sodium 135 - 145 mmol/L 137 141 137  Potassium 3.5 - 5.1 mmol/L 4.4 4.4 4.4  Chloride 98 - 111 mmol/L 104 105 105  CO2 22 - 32 mmol/L 25 - 23  Calcium 8.9 - 10.3 mg/dL 8.8(L) - 9.0  Total Protein 6.5 - 8.1 g/dL 6.9 - 7.7  Total Bilirubin 0.3 - 1.2 mg/dL 0.6 - 0.6  Alkaline Phos 38 - 126 U/L 54 - 61  AST 15 - 41 U/L 19 - 16  ALT 0 - 44 U/L 14 - 11    DIAGNOSTIC IMAGING:  I have independently reviewed the scans and discussed with the patient. No results found.   ASSESSMENT:  1. T2N0 left breast infiltrating lobular carcinoma: -Status post lumpectomy in June 2014, 1.4 cm, grade 1, lymphovascular invasion positive, ER 100%, PR 26%, Ki-67 18%. -She underwent XRT.She did  not take adjuvant endocrine therapy. -Mammogram on 07/13/2020, BI-RADS Category 1. -CEA was 10.3, Ca1 2515.8 and CA 19-9 05. -PET scan on 08/07/2020 shows widespread hypermetabolic bone meta stasis with soft tissue components at T1 and sacrum. Thoracic nodal metastasis. -MRI of the thoracic spine on 08/15/2020 shows pathological fracture of T1 with mild osseous retropulsion and possible left C8 nerve root encroachment within the foramen at C7-T1. No cord deformity. -Right third rib biopsy on 08/22/2020 consistent with metastatic breast cancer, ER 100% positive, PR 40% positive, Ki-67 10%, HER-2 2+ and negative by FISH. -Ibrance 100 mg 3 weeks on 1 week off on anastrozole 1 mg daily started on 08/31/2020. -Ibrance held on 10/13/2020 secondary to severe tiredness.  2. Social/family history: -She quit smoking 20 years ago, smoked 2 packs/day for more than 20 years prior to quitting. -She lives at home by herself and is independent of all activities. -Daughter who has multiple myeloma at age 57.   PLAN:  1.Metastatic cancer to the bones, highly likely breast cancer: -She is currently taking anastrozole without any major problems. - Reviewed labs which showed normal chemistries, LFTs and CBC.  Tumor markers are pending. - Recommend starting on Ibrance 75 mg 3 weeks on 1  week off tomorrow. - RTC 4 weeks for labs, follow-up.  2. CAD and RCA stenting: -Continue metoprolol and Plavix.  3.Right hip pain: -Bilateral rib pains have improved. - She is taking hydrocodone 5 mg as needed for right hip pain.  4. Bone metastasis: -Continue denosumab monthly.  Calcium is adequate.  5.  Depression/anxiety: -Continue Lexapro 10 mg daily.  Continue clorazepate 15 mg at bedtime as needed.   Orders placed this encounter:  No orders of the defined types were placed in this encounter.    Derek Jack, MD [place] [phone]   I, Milinda Antis, am acting as a  scribe for Dr. Sanda Linger.  I, Derek Jack MD, have reviewed the above documentation for accuracy and completeness, and I agree with the above.

## 2020-12-07 ENCOUNTER — Other Ambulatory Visit: Payer: Self-pay

## 2020-12-07 ENCOUNTER — Inpatient Hospital Stay (HOSPITAL_COMMUNITY): Payer: Medicare Other

## 2020-12-07 ENCOUNTER — Inpatient Hospital Stay (HOSPITAL_COMMUNITY): Payer: Medicare Other | Attending: Hematology | Admitting: Hematology

## 2020-12-07 DIAGNOSIS — C7951 Secondary malignant neoplasm of bone: Secondary | ICD-10-CM | POA: Insufficient documentation

## 2020-12-07 DIAGNOSIS — Z7902 Long term (current) use of antithrombotics/antiplatelets: Secondary | ICD-10-CM | POA: Diagnosis not present

## 2020-12-07 DIAGNOSIS — C50919 Malignant neoplasm of unspecified site of unspecified female breast: Secondary | ICD-10-CM

## 2020-12-07 DIAGNOSIS — Z79899 Other long term (current) drug therapy: Secondary | ICD-10-CM | POA: Insufficient documentation

## 2020-12-07 DIAGNOSIS — I2510 Atherosclerotic heart disease of native coronary artery without angina pectoris: Secondary | ICD-10-CM | POA: Diagnosis not present

## 2020-12-07 DIAGNOSIS — Z923 Personal history of irradiation: Secondary | ICD-10-CM | POA: Diagnosis not present

## 2020-12-07 DIAGNOSIS — Z853 Personal history of malignant neoplasm of breast: Secondary | ICD-10-CM | POA: Diagnosis not present

## 2020-12-07 DIAGNOSIS — Z170 Estrogen receptor positive status [ER+]: Secondary | ICD-10-CM | POA: Diagnosis not present

## 2020-12-07 DIAGNOSIS — F32A Depression, unspecified: Secondary | ICD-10-CM | POA: Diagnosis not present

## 2020-12-07 DIAGNOSIS — M25551 Pain in right hip: Secondary | ICD-10-CM | POA: Diagnosis not present

## 2020-12-07 DIAGNOSIS — Z79811 Long term (current) use of aromatase inhibitors: Secondary | ICD-10-CM | POA: Diagnosis not present

## 2020-12-07 DIAGNOSIS — Z87891 Personal history of nicotine dependence: Secondary | ICD-10-CM | POA: Diagnosis not present

## 2020-12-07 DIAGNOSIS — C50212 Malignant neoplasm of upper-inner quadrant of left female breast: Secondary | ICD-10-CM | POA: Insufficient documentation

## 2020-12-07 LAB — CBC WITH DIFFERENTIAL/PLATELET
Abs Immature Granulocytes: 0.04 10*3/uL (ref 0.00–0.07)
Basophils Absolute: 0.1 10*3/uL (ref 0.0–0.1)
Basophils Relative: 1 %
Eosinophils Absolute: 0.2 10*3/uL (ref 0.0–0.5)
Eosinophils Relative: 2 %
HCT: 40.3 % (ref 36.0–46.0)
Hemoglobin: 13 g/dL (ref 12.0–15.0)
Immature Granulocytes: 0 %
Lymphocytes Relative: 27 %
Lymphs Abs: 2.5 10*3/uL (ref 0.7–4.0)
MCH: 31.3 pg (ref 26.0–34.0)
MCHC: 32.3 g/dL (ref 30.0–36.0)
MCV: 97.1 fL (ref 80.0–100.0)
Monocytes Absolute: 0.5 10*3/uL (ref 0.1–1.0)
Monocytes Relative: 5 %
Neutro Abs: 5.8 10*3/uL (ref 1.7–7.7)
Neutrophils Relative %: 65 %
Platelets: 249 10*3/uL (ref 150–400)
RBC: 4.15 MIL/uL (ref 3.87–5.11)
RDW: 15.5 % (ref 11.5–15.5)
WBC: 9 10*3/uL (ref 4.0–10.5)
nRBC: 0 % (ref 0.0–0.2)

## 2020-12-07 LAB — COMPREHENSIVE METABOLIC PANEL
ALT: 14 U/L (ref 0–44)
AST: 21 U/L (ref 15–41)
Albumin: 3.8 g/dL (ref 3.5–5.0)
Alkaline Phosphatase: 51 U/L (ref 38–126)
Anion gap: 8 (ref 5–15)
BUN: 13 mg/dL (ref 8–23)
CO2: 24 mmol/L (ref 22–32)
Calcium: 8.6 mg/dL — ABNORMAL LOW (ref 8.9–10.3)
Chloride: 105 mmol/L (ref 98–111)
Creatinine, Ser: 0.68 mg/dL (ref 0.44–1.00)
GFR, Estimated: >60 mL/min (ref >60)
Glucose, Bld: 115 mg/dL — ABNORMAL HIGH (ref 70–99)
Potassium: 3.8 mmol/L (ref 3.5–5.1)
Sodium: 137 mmol/L (ref 135–145)
Total Bilirubin: 0.5 mg/dL (ref 0.3–1.2)
Total Protein: 7 g/dL (ref 6.5–8.1)

## 2020-12-07 MED ORDER — DENOSUMAB 120 MG/1.7ML ~~LOC~~ SOLN
120.0000 mg | Freq: Once | SUBCUTANEOUS | Status: AC
  Administered 2020-12-07: 120 mg via SUBCUTANEOUS
  Filled 2020-12-07: qty 1.7

## 2020-12-07 NOTE — Progress Notes (Signed)
Patient assessed and labs reviewed by Dr. Delton Coombes. Okay to proceed with injection. Primary nurse and pharmacy aware.

## 2020-12-07 NOTE — Progress Notes (Unsigned)
Kara Woods presents today for office visit and  injection per the provider's orders.  Xgeva injection administration without incident to left lower abdomen ; injection site WNL; see MAR for injection details.  Patient tolerated procedure well and without incident.  No questions or complaints noted at this time.  Patient discharged ambulatory and in stable condition with family member.

## 2020-12-07 NOTE — Patient Instructions (Signed)
[facility] at [facility] Discharge Instructions  You were seen today by Dr. Katragadda. Follow up as scheduled.   Thank you for choosing [facility] at [facility] to provide your oncology and hematology care.  To afford each patient quality time with our provider, please arrive at least 15 minutes before your scheduled appointment time.   If you have a lab appointment with the Cancer Center please come in thru the Main Entrance and check in at the main information desk.  You need to re-schedule your appointment should you arrive 10 or more minutes late.  We strive to give you quality time with our providers, and arriving late affects you and other patients whose appointments are after yours.  Also, if you no show three or more times for appointments you may be dismissed from the clinic at the providers discretion.     Again, thank you for choosing [facility].  Our hope is that these requests will decrease the amount of time that you wait before being seen by our physicians.       _____________________________________________________________  Should you have questions after your visit to [facility], please contact our office at [phone] and follow the prompts.  Our office hours are 8:00 a.m. and 4:30 p.m. Monday - Friday.  Please note that voicemails left after 4:00 p.m. may not be returned until the following business day.  We are closed weekends and major holidays.  You do have access to a nurse 24-7, just call the main number to the clinic [phone] and do not press any options, hold on the line and a nurse will answer the phone.    For prescription refill requests, have your pharmacy contact our office and allow 72 hours.    Due to Covid, you will need to wear a mask upon entering the hospital. If you do not have a mask, a mask will be given to you at the Main Entrance upon arrival. For doctor visits, patients  may have 1 support person age 18 or older with them. For treatment visits, patients can not have anyone with them due to social distancing guidelines and our immunocompromised population.      

## 2020-12-08 LAB — CANCER ANTIGEN 27.29: CA 27.29: 32.7 U/mL (ref 0.0–38.6)

## 2020-12-08 LAB — CANCER ANTIGEN 15-3: CA 15-3: 30.9 U/mL — ABNORMAL HIGH (ref 0.0–25.0)

## 2020-12-17 ENCOUNTER — Other Ambulatory Visit (HOSPITAL_COMMUNITY): Payer: Self-pay | Admitting: Hematology

## 2020-12-19 ENCOUNTER — Telehealth (HOSPITAL_COMMUNITY): Payer: Self-pay | Admitting: Surgery

## 2020-12-19 ENCOUNTER — Other Ambulatory Visit (HOSPITAL_COMMUNITY): Payer: Self-pay | Admitting: Hematology

## 2020-12-19 NOTE — Telephone Encounter (Signed)
Pt's daughter Kara Woods called stating that her mother was having nausea, diarrhea (more than 6 times a day), weakness, and tremors since restarting Ibrance.  I notified Dr. Delton Coombes, who stated that the pt should stop taking the Ibrance.  If her symptoms resolve completely before her next appointment, then she can restart the Ibrance.  I called the pt's daughter and she verbalized understanding of these instructions, and she also stated that she would keep a log of the pt's symptoms to bring to the next appointment.  I told her to call our office back if she had any more questions or concerns.

## 2020-12-20 ENCOUNTER — Other Ambulatory Visit (HOSPITAL_COMMUNITY): Payer: Self-pay | Admitting: Hematology

## 2020-12-27 ENCOUNTER — Telehealth (HOSPITAL_COMMUNITY): Payer: Self-pay

## 2020-12-27 ENCOUNTER — Encounter (HOSPITAL_COMMUNITY): Payer: Self-pay

## 2020-12-27 NOTE — Telephone Encounter (Signed)
This nurse spoke with patients daughter who stated after discontinuing her [name] she is extremely fatigued, Complains of a lot of pain and refuses to get out of the bed.  This nurse spoke with Dr. Delton Coombes and he suggested that she come in for a visit.  Patient's appointment changed.  Patient made aware. No further questions or concerns at this time.

## 2021-01-02 ENCOUNTER — Inpatient Hospital Stay (HOSPITAL_BASED_OUTPATIENT_CLINIC_OR_DEPARTMENT_OTHER): Payer: Medicare Other | Admitting: Oncology

## 2021-01-02 ENCOUNTER — Encounter (HOSPITAL_COMMUNITY): Payer: Self-pay | Admitting: Oncology

## 2021-01-02 ENCOUNTER — Inpatient Hospital Stay (HOSPITAL_COMMUNITY): Payer: Medicare Other

## 2021-01-02 ENCOUNTER — Inpatient Hospital Stay (HOSPITAL_COMMUNITY): Payer: Medicare Other | Attending: Hematology

## 2021-01-02 ENCOUNTER — Other Ambulatory Visit: Payer: Self-pay

## 2021-01-02 VITALS — BP 119/72 | HR 100 | Temp 98.1°F | Resp 18 | Wt 157.8 lb

## 2021-01-02 DIAGNOSIS — C7951 Secondary malignant neoplasm of bone: Secondary | ICD-10-CM | POA: Insufficient documentation

## 2021-01-02 DIAGNOSIS — Z79811 Long term (current) use of aromatase inhibitors: Secondary | ICD-10-CM | POA: Diagnosis not present

## 2021-01-02 DIAGNOSIS — R197 Diarrhea, unspecified: Secondary | ICD-10-CM

## 2021-01-02 DIAGNOSIS — Z923 Personal history of irradiation: Secondary | ICD-10-CM | POA: Insufficient documentation

## 2021-01-02 DIAGNOSIS — I2510 Atherosclerotic heart disease of native coronary artery without angina pectoris: Secondary | ICD-10-CM | POA: Diagnosis not present

## 2021-01-02 DIAGNOSIS — Z7902 Long term (current) use of antithrombotics/antiplatelets: Secondary | ICD-10-CM | POA: Diagnosis not present

## 2021-01-02 DIAGNOSIS — Z170 Estrogen receptor positive status [ER+]: Secondary | ICD-10-CM

## 2021-01-02 DIAGNOSIS — R110 Nausea: Secondary | ICD-10-CM | POA: Insufficient documentation

## 2021-01-02 DIAGNOSIS — M25551 Pain in right hip: Secondary | ICD-10-CM | POA: Diagnosis not present

## 2021-01-02 DIAGNOSIS — C50212 Malignant neoplasm of upper-inner quadrant of left female breast: Secondary | ICD-10-CM

## 2021-01-02 DIAGNOSIS — Z79899 Other long term (current) drug therapy: Secondary | ICD-10-CM | POA: Diagnosis not present

## 2021-01-02 DIAGNOSIS — Z87891 Personal history of nicotine dependence: Secondary | ICD-10-CM | POA: Insufficient documentation

## 2021-01-02 DIAGNOSIS — C50919 Malignant neoplasm of unspecified site of unspecified female breast: Secondary | ICD-10-CM

## 2021-01-02 DIAGNOSIS — F419 Anxiety disorder, unspecified: Secondary | ICD-10-CM

## 2021-01-02 DIAGNOSIS — F32A Depression, unspecified: Secondary | ICD-10-CM | POA: Diagnosis not present

## 2021-01-02 DIAGNOSIS — Z853 Personal history of malignant neoplasm of breast: Secondary | ICD-10-CM

## 2021-01-02 LAB — CBC WITH DIFFERENTIAL/PLATELET
Abs Immature Granulocytes: 0.03 10*3/uL (ref 0.00–0.07)
Basophils Absolute: 0.1 10*3/uL (ref 0.0–0.1)
Basophils Relative: 1 %
Eosinophils Absolute: 0.1 10*3/uL (ref 0.0–0.5)
Eosinophils Relative: 1 %
HCT: 40.9 % (ref 36.0–46.0)
Hemoglobin: 12.9 g/dL (ref 12.0–15.0)
Immature Granulocytes: 1 %
Lymphocytes Relative: 40 %
Lymphs Abs: 1.7 10*3/uL (ref 0.7–4.0)
MCH: 31.7 pg (ref 26.0–34.0)
MCHC: 31.5 g/dL (ref 30.0–36.0)
MCV: 100.5 fL — ABNORMAL HIGH (ref 80.0–100.0)
Monocytes Absolute: 0.3 10*3/uL (ref 0.1–1.0)
Monocytes Relative: 7 %
Neutro Abs: 2.2 10*3/uL (ref 1.7–7.7)
Neutrophils Relative %: 50 %
Platelets: 278 10*3/uL (ref 150–400)
RBC: 4.07 MIL/uL (ref 3.87–5.11)
RDW: 15.3 % (ref 11.5–15.5)
WBC: 4.4 10*3/uL (ref 4.0–10.5)
nRBC: 0 % (ref 0.0–0.2)

## 2021-01-02 LAB — COMPREHENSIVE METABOLIC PANEL
ALT: 14 U/L (ref 0–44)
AST: 17 U/L (ref 15–41)
Albumin: 3.9 g/dL (ref 3.5–5.0)
Alkaline Phosphatase: 41 U/L (ref 38–126)
Anion gap: 6 (ref 5–15)
BUN: 10 mg/dL (ref 8–23)
CO2: 28 mmol/L (ref 22–32)
Calcium: 9 mg/dL (ref 8.9–10.3)
Chloride: 105 mmol/L (ref 98–111)
Creatinine, Ser: 0.66 mg/dL (ref 0.44–1.00)
GFR, Estimated: >60 mL/min (ref >60)
Glucose, Bld: 119 mg/dL — ABNORMAL HIGH (ref 70–99)
Potassium: 3.8 mmol/L (ref 3.5–5.1)
Sodium: 139 mmol/L (ref 135–145)
Total Bilirubin: 0.6 mg/dL (ref 0.3–1.2)
Total Protein: 7.4 g/dL (ref 6.5–8.1)

## 2021-01-02 MED ORDER — CLORAZEPATE DIPOTASSIUM 15 MG PO TABS
15.0000 mg | ORAL_TABLET | Freq: Every day | ORAL | 0 refills | Status: DC
Start: 2021-01-02 — End: 2022-10-01

## 2021-01-02 MED ORDER — ONDANSETRON HCL 8 MG PO TABS: 8.0000 mg | ORAL_TABLET | Freq: Three times a day (TID) | ORAL | 0 refills | Status: DC | PRN

## 2021-01-02 MED ORDER — DENOSUMAB 120 MG/1.7ML ~~LOC~~ SOLN
120.0000 mg | Freq: Once | SUBCUTANEOUS | Status: AC
  Administered 2021-01-02: 120 mg via SUBCUTANEOUS
  Filled 2021-01-02: qty 1.7

## 2021-01-02 MED ORDER — HYDROCODONE-ACETAMINOPHEN 5-325 MG PO TABS: 1.0000 | ORAL_TABLET | ORAL | 0 refills | Status: DC | PRN

## 2021-01-02 NOTE — Progress Notes (Signed)
[facility] [address] [place] [address]   CLINIC:  Medical Oncology/Hematology  PCP:  Celene Squibb, MD [address] [place] [address] [phone]   REASON FOR VISIT:  Follow-up for metastatic left breast cancer  PRIOR THERAPY:  1. Left lumpectomy in 04/2013. 2. Radiation therapy.  NGS Results: ER/PR positive, HER-2 negative, Ki-67 10%  CURRENT THERAPY: Arimidex QD; Ibrance 100 mg 3 weeks on, 1 week off; Xgeva monthly  BRIEF ONCOLOGIC HISTORY:  Oncology History   No history exists.    CANCER STAGING: Cancer Staging History of breast cancer Staging form: Breast, AJCC 7th Edition - Clinical stage from 04/20/2013: Stage IIA (T2, N0, cM0) - Unsigned - Pathologic: No stage assigned - Unsigned   INTERVAL HISTORY:  Kara Woods, a 87 y.o. female, returns for routine follow-up of her metastatic left breast cancer. Kara Woods was last seen on 12/07/20.  She received Xgeva last on 12/07/2020.  She started dose reduced Ibrance 75 mg X 3 weeks with 1 week but unfortunately about 10 days in developed nausea, diarrhea, weakness and tremors.  She was instructed to stop Ibrance to see if symptoms resolved.  Kara Woods improvement of her symptoms after stopping Ibrance.  She also endorses several stressors while taking Ibrance including her heat stopped working and worsening low back and right hip pain.  Endorses significant anxiety about her health and is unsure if this may have contributed.  She has intermittent tremors to bilateral hands and her lip quivers.  When her daughter first saw this happen she was rushed to the hospital for a work-up for seizures and/or stroke.  All was negative.  She continues to have these episodes during stressful situations.  Endorses weight loss secondary to decrease in appetite while on Ibrance.  Appetite overall has improved since she has stopped the Ibrance.  She is taking her hydrocodone as needed for  back pain.  She is leaving for the beach with her daughter Kara Woods today for a change of scenery.   REVIEW OF SYSTEMS:  Review of Systems  Constitutional: Positive for appetite change and fatigue.  Gastrointestinal: Positive for diarrhea and nausea.  Musculoskeletal: Positive for arthralgias.  Neurological: Positive for dizziness.  Psychiatric/Behavioral: Positive for sleep disturbance. The patient is nervous/anxious.   All other systems reviewed and are negative.   PAST MEDICAL/SURGICAL HISTORY:  Past Medical History:  Diagnosis Date  . Anxiety   . Arthritis   . Blind left eye   . Breast cancer ([facility])    left   . Colitis   . Coronary artery disease    a. 10/2016 Staged PCI: RCA 50p, 43m(2.25x12 Resolute Onyx DES), LCX 50p, 824m2.75x23 Xience Alpine DES). Residual LAD 50p/m, 4555m  . CMarland Kitchenstocele 10/11/2013  . Depression   . Diastolic dysfunction    a. 09/2016 Echo: EF 50%, diffuse hypokinesis, mild LVH, grade 1 diastolic dysfunction, mild mitral regurgitation, mildly dilated left atrium.  . GMarland KitchenRD (gastroesophageal reflux disease)    occasional Tums only  . HOH (hard of hearing)   . Pelvic relaxation 10/11/2013  . Proctitis    colonsocopy 2007, Canasa suppositories  . S/P endoscopy March 2009   mild erosive reflux esophagitis, Schatzki's ring, s/p dilation  . Schatzki's ring   . Shortness of breath    occ if anxiety  . Wears dentures    upper  . Wears glasses    Past Surgical History:  Procedure Laterality Date  .  ABDOMINAL HYSTERECTOMY    . ANTERIOR AND POSTERIOR REPAIR N/A 05/17/2014   Procedure: ANTERIOR (CYSTOCELE) AND POSTERIOR REPAIR (RECTOCELE);  Surgeon: Reece Packer, MD;  Location: [facility];  Service: Urology;  Laterality: N/A;  . APPENDECTOMY    . BREAST LUMPECTOMY WITH NEEDLE LOCALIZATION AND AXILLARY SENTINEL LYMPH NODE BX Left 05/03/2013   Procedure: BREAST LUMPECTOMY WITH NEEDLE LOCALIZATION AND AXILLARY SENTINEL LYMPH NODE BX;  Surgeon: Edward Jolly,  MD;  Location: [facility];  Service: General;  Laterality: Left;  . BREAST SURGERY Left 05/19/13  . CARDIAC CATHETERIZATION  1998  . CARDIAC CATHETERIZATION N/A 11/04/2016   Procedure: Left Heart Cath and Coronary Angiography;  Surgeon: Troy Sine, MD;  Location: [facility];  Service: Cardiovascular;  Laterality: N/A;  . CARDIAC CATHETERIZATION N/A 11/05/2016   Procedure: Coronary Stent Intervention;  Surgeon: Troy Sine, MD;  Location: [facility];  Service: Cardiovascular;  Laterality: N/A;  . CHOLECYSTECTOMY    . COLONOSCOPY  05/06/2006   Diffuse inflammatory changes of the rectal mucosa, consistent  with proctitis.  Otherwise, normal colon to terminal ileum  . CORONARY ANGIOPLASTY  11/04/2016  . CORONARY STENT PLACEMENT     Drug-eluting coronary artery stent, non-bioabsorbable-polymer-coated  . CYSTOSCOPY N/A 05/17/2014   Procedure: CYSTOSCOPY;  Surgeon: Reece Packer, MD;  Location: [facility];  Service: Urology;  Laterality: N/A;  . ESOPHAGOGASTRODUODENOSCOPY  02/09/2008   Schatzki ring status post dilation/Distal esophageal erosion consistent with mild erosive reflux  esophagitis, otherwise unremarkable esophagus, normal stomach, D1, D2.  . EYE SURGERY     lt cataract-implant  . EYE SURGERY     lt-lens repaced,l  . [place] SURGERY  1993  . RE-EXCISION OF BREAST LUMPECTOMY Left 05/19/2013   Procedure: RE-EXCISION OF BREAST LUMPECTOMY;  Surgeon: Edward Jolly, MD;  Location: [facility];  Service: General;  Laterality: Left;  . RETINAL DETACHMENT SURGERY  1978   Left  . VAGINAL HYSTERECTOMY Bilateral 05/17/2014   Procedure: HYSTERECTOMY VAGINAL with Bilateral Salpingo-Oophorectomy; Bladder cystotomy repair;  Surgeon: Marvene Staff, MD;  Location: [facility];  Service: Gynecology;  Laterality: Bilateral;  . VAGINAL PROLAPSE REPAIR N/A 05/17/2014   Procedure: VAGINAL VAULT PROLAPSE AND GRAFT;  Surgeon: Reece Packer,  MD;  Location: [facility];  Service: Urology;  Laterality: N/A;    SOCIAL HISTORY:  Social History   Socioeconomic History  . Marital status: Widowed    Spouse name: Not on file  . Number of children: 6  . Years of education: Not on file  . Highest education level: Not on file  Occupational History  . Occupation: Retired  Tobacco Use  . Smoking status: Former Smoker    Packs/day: 1.00    Types: Cigarettes    Quit date: 04/28/1990    Years since quitting: 30.7  . Smokeless tobacco: Never Used  . Tobacco comment: quit 28 yrs ago  Substance and Sexual Activity  . Alcohol use: Yes    Alcohol/week: 1.0 standard drink    Types: 1 Glasses of wine per week    Comment: occ  . Drug use: No  . Sexual activity: Not Currently    Birth control/protection: Post-menopausal  Other Topics Concern  . Not on file  Social History Narrative  . Not on file   Social Determinants of Health   Financial Resource Strain: Low Risk   . Difficulty of Paying Living Expenses: Not hard at all  Food Insecurity: No Food  Insecurity  . Worried About Charity fundraiser in the Last Year: Never true  . Ran Out of Food in the Last Year: Never true  Transportation Needs: No Transportation Needs  . Lack of Transportation (Medical): No  . Lack of Transportation (Non-Medical): No  Physical Activity: Insufficiently Active  . Days of Exercise per Week: 2 days  . Minutes of Exercise per Session: 10 min  Stress: No Stress Concern Present  . Feeling of Stress : Only a little  Social Connections: Socially Isolated  . Frequency of Communication with Friends and Family: More than three times a week  . Frequency of Social Gatherings with Friends and Family: More than three times a week  . Attends Religious Services: Never  . Active Member of Clubs or Organizations: No  . Attends Archivist Meetings: Never  . Marital Status: Widowed  Intimate Partner Violence: Not At Risk  . Fear of Current or Ex-Partner: No   . Emotionally Abused: No  . Physically Abused: No  . Sexually Abused: No    FAMILY HISTORY:  Family History  Problem Relation Age of Onset  . Cancer Brother        spinal  . Heart attack Mother   . Heart attack Father   . Heart attack Son   . Heart attack Son   . Depression Son   . Multiple myeloma Daughter   . Anemia Daughter   . Colon cancer Neg Hx     CURRENT MEDICATIONS:  Current Outpatient Medications  Medication Sig Dispense Refill  . ondansetron (ZOFRAN) 8 MG tablet Take 1 tablet (8 mg total) by mouth every 8 (eight) hours as needed for nausea or vomiting. 20 tablet 0  . acetaminophen (TYLENOL) 325 MG tablet Take 2 tablets (650 mg total) by mouth every 6 (six) hours as needed for mild pain or headache (or temp > 37.5 C (99.5 F)). 40 tablet 0  . anastrozole (ARIMIDEX) 1 MG tablet TAKE 1 TABLET BY MOUTH EVERY DAY 90 tablet 2  . aspirin EC 81 MG EC tablet Take 2 tablets (162 mg total) by mouth daily. Swallow whole. 60 tablet 2  . carboxymethylcellulose (REFRESH PLUS) 0.5 % SOLN Place 3-4 drops into both eyes 3 (three) times daily as needed (dry eyes). Preservative free    . clopidogrel (PLAVIX) 75 MG tablet Take 75 mg by mouth daily.     . clorazepate (TRANXENE) 15 MG tablet Take 1 tablet (15 mg total) by mouth daily. 90 tablet 0  . escitalopram (LEXAPRO) 10 MG tablet TAKE 1 TABLET BY MOUTH EVERY DAY 30 tablet 2  . ezetimibe (ZETIA) 10 MG tablet Take 10 mg by mouth daily.    Marland Kitchen gabapentin (NEURONTIN) 100 MG capsule TAKE 1 CAPSULE BY MOUTH THREE TIMES A DAY 90 capsule 2  . HYDROcodone-acetaminophen (NORCO) 5-325 MG tablet Take 1 tablet by mouth every 4 (four) hours as needed for moderate pain. 180 tablet 0  . latanoprost (XALATAN) 0.005 % ophthalmic solution Place 1 drop into both eyes at bedtime.    . metoprolol tartrate (LOPRESSOR) 25 MG tablet Take 12.5 mg in a.m. and 25 mg at nighttime.    . nitroGLYCERIN (NITROSTAT) 0.4 MG SL tablet Place 1 tablet (0.4 mg total) under the  tongue every 5 (five) minutes as needed. 25 tablet 3  . palbociclib (IBRANCE) 75 MG tablet Take 1 tablet (75 mg total) by mouth daily. Take for 21 days on, 7 days off, repeat every 28 days. (Patient  not taking: Reported on 01/02/2021) 21 tablet 3  . pantoprazole (PROTONIX) 40 MG tablet Take 1 tablet (40 mg total) by mouth daily before breakfast. 30 tablet 1   No current facility-administered medications for this visit.    ALLERGIES:  Allergies  Allergen Reactions  . Contrast Media [Iodinated Diagnostic Agents] Anaphylaxis    Adverse reaction; pt hyperventilating, c/o CP and SOB   . Lipitor [Atorvastatin] Other (See Comments)    Muscle aches, cramps, fatigue, weight loss/poor appetite  . Sulfamethoxazole Other (See Comments)    Reaction was years ago unknown  . Sulfonamide Derivatives Other (See Comments)    Dizziness     PHYSICAL EXAM:  Performance status (ECOG): 1 - Symptomatic but completely ambulatory  Vitals:   01/02/21 0843  BP: 119/72  Pulse: 100  Resp: 18  Temp: 98.1 F (36.7 C)  SpO2: 94%   Wt Readings from Last 3 Encounters:  01/02/21 157 lb 13.6 oz (71.6 kg)  10/19/20 144 lb 10 oz (65.6 kg)  10/12/20 157 lb 9.6 oz (71.5 kg)   Physical Exam Vitals reviewed.  Constitutional:      Appearance: Normal appearance.  Cardiovascular:     Rate and Rhythm: Regular rhythm.     Pulses: Normal pulses.     Heart sounds: Normal heart sounds.  Pulmonary:     Effort: Pulmonary effort is normal.     Breath sounds: Normal breath sounds.  Abdominal:     Palpations: Abdomen is soft.  Musculoskeletal:        General: No swelling.  Neurological:     Mental Status: She is alert and oriented to person, place, and time.  Psychiatric:        Mood and Affect: Mood normal.        Behavior: Behavior normal.      LABORATORY DATA:  I have reviewed the labs as listed.  CBC Latest Ref Rng & Units 01/02/2021 12/07/2020 10/19/2020  WBC 4.0 - 10.5 K/uL 4.4 9.0 -  Hemoglobin 12.0 -  15.0 g/dL 12.9 13.0 13.9  Hematocrit 36.0 - 46.0 % 40.9 40.3 41.0  Platelets 150 - 400 K/uL 278 249 -   CMP Latest Ref Rng & Units 01/02/2021 12/07/2020 11/09/2020  Glucose 70 - 99 mg/dL 119(H) 115(H) 106(H)  BUN 8 - 23 mg/dL 10 13 14   Creatinine 0.44 - 1.00 mg/dL 0.66 0.68 0.68  Sodium 135 - 145 mmol/L 139 137 137  Potassium 3.5 - 5.1 mmol/L 3.8 3.8 4.4  Chloride 98 - 111 mmol/L 105 105 104  CO2 22 - 32 mmol/L 28 24 25   Calcium 8.9 - 10.3 mg/dL 9.0 8.6(L) 8.8(L)  Total Protein 6.5 - 8.1 g/dL 7.4 7.0 6.9  Total Bilirubin 0.3 - 1.2 mg/dL 0.6 0.5 0.6  Alkaline Phos 38 - 126 U/L 41 51 54  AST 15 - 41 U/L 17 21 19   ALT 0 - 44 U/L 14 14 14     DIAGNOSTIC IMAGING:  I have independently reviewed the scans and discussed with the patient. No results found.   ASSESSMENT:  1. T2N0 left breast infiltrating lobular carcinoma: -Status post lumpectomy in June 2014, 1.4 cm, grade 1, lymphovascular invasion positive, ER 100%, PR 26%, Ki-67 18%. -She underwent XRT.She did not take adjuvant endocrine therapy. -Mammogram on 07/13/2020, BI-RADS Category 1. -CEA was 10.3, Ca1 2515.8 and CA 19-9 05. -PET scan on 08/07/2020 shows widespread hypermetabolic bone meta stasis with soft tissue components at T1 and sacrum. Thoracic nodal metastasis. -MRI of the thoracic  spine on 08/15/2020 shows pathological fracture of T1 with mild osseous retropulsion and possible left C8 nerve root encroachment within the foramen at C7-T1. No cord deformity. -Right third rib biopsy on 08/22/2020 consistent with metastatic breast cancer, ER 100% positive, PR 40% positive, Ki-67 10%, HER-2 2+ and negative by FISH. -Ibrance 100 mg 3 weeks on 1 week off on anastrozole 1 mg daily started on 08/31/2020. -Ibrance held on 10/13/2020 secondary to severe tiredness.  2. Social/family history: -She quit smoking 20 years ago, smoked 2 packs/day for more than 20 years prior to quitting. -She lives at home by herself and is independent of  all activities. -Daughter who has multiple myeloma at age 9.   PLAN:  1.Metastatic cancer to the bones, highly likely breast cancer: -She is currently taking anastrozole without any major problems. -She started dose reduced Ibrance 75 mg 3 weeks on 1 week off 3 weeks ago. -She unfortunately developed nausea, diarrhea and anorexia about 10 days after and and was asked to stop Ibrance. -Symptoms have improved since stopping Ibrance. -Unsure if symptoms are related to [place] or emotional stressors or combination of the two. -Patient is willing to retry 75 mg Ibrance to see if she is able to tolerate.  She will restart today. -Last tumor markers showed a normal CA 27-29 and slightly elevated CA 15-3 (30.9). -Labs from today show an unremarkable CBC and CMP. -RTC in 1 month for repeat labs and assessment prior to next cycle.   2. CAD and RCA stenting: -Continue metoprolol and Plavix.  3.Right hip pain: -Complains of worsening right hip and low back pain.  - She is taking hydrocodone 5 mg as needed.  She is asking for a refill today.  Prescription sent. -Imaging from 08/07/2020 shows multiple osseous metastasis within the thoracic spine and posterior ribs bilaterally.  Pathological fracture of T1 and C8 nerve roots.  Mild pathological fracture of superior endplate of T7. -If hip pain is persistent, would recommend referral to radiation oncology.  4. Bone metastasis: -Continue denosumab monthly.   -Calcium is adequate. -She will get Xgeva today.  5.  Depression/anxiety: -Recommend increasing Lexapro from 10 mg to 20 mg daily. -She can continue clorazepate 15 mg at bedtime as needed.  6.  Nausea/diarrhea/anorexia: -Secondary to Ibrance per patient. -Appears to improved since discontinuing. -We will send prescription for Zofran 8 mg every 8 hours as needed for nausea. -She can take Imodium for diarrhea.  Disposition: -Restart Ibrance 75 mg 3 weeks on 1 week off today. -RTC  in 1 month for repeat lab work and assessment prior to cycle 3.  Greater than 50% was spent in counseling and coordination of care with this patient including but not limited to discussion of the relevant topics above (See A&P) including, but not limited to diagnosis and management of acute and chronic medical conditions.    Orders placed this encounter:  No orders of the defined types were placed in this encounter.  Kara Casa, NP 01/02/2021 3:05 PM  [place] [phone]

## 2021-01-02 NOTE — Progress Notes (Signed)
Patient is not taking Calcium but Faythe Casa, NP, is aware of this.  Denied tooth, jaw, and leg pain.  No recent or upcoming dental visits.  Patient tolerated injection with no complaints.  Site clean and dry with no bruising or swelling noted at site.  Band aid applied.  VSS with discharge and left ambulatory with no s/s of distress noted.

## 2021-01-03 LAB — CANCER ANTIGEN 27.29: CA 27.29: 35.7 U/mL (ref 0.0–38.6)

## 2021-01-03 LAB — CANCER ANTIGEN 15-3: CA 15-3: 32.2 U/mL — ABNORMAL HIGH (ref 0.0–25.0)

## 2021-01-04 ENCOUNTER — Ambulatory Visit: Payer: Medicare Other | Admitting: Cardiovascular Disease

## 2021-01-05 ENCOUNTER — Ambulatory Visit: Payer: Medicare Other | Admitting: Family Medicine

## 2021-01-08 ENCOUNTER — Ambulatory Visit (HOSPITAL_COMMUNITY): Payer: Medicare Other | Admitting: Hematology

## 2021-01-08 ENCOUNTER — Ambulatory Visit (HOSPITAL_COMMUNITY): Payer: Medicare Other

## 2021-01-08 ENCOUNTER — Other Ambulatory Visit (HOSPITAL_COMMUNITY): Payer: Medicare Other

## 2021-01-09 ENCOUNTER — Other Ambulatory Visit (HOSPITAL_COMMUNITY): Payer: Self-pay | Admitting: Surgery

## 2021-01-09 ENCOUNTER — Telehealth (HOSPITAL_COMMUNITY): Payer: Self-pay | Admitting: Surgery

## 2021-01-09 DIAGNOSIS — Z170 Estrogen receptor positive status [ER+]: Secondary | ICD-10-CM

## 2021-01-09 DIAGNOSIS — C50212 Malignant neoplasm of upper-inner quadrant of left female breast: Secondary | ICD-10-CM

## 2021-01-09 DIAGNOSIS — C50919 Malignant neoplasm of unspecified site of unspecified female breast: Secondary | ICD-10-CM

## 2021-01-09 NOTE — Telephone Encounter (Signed)
The pt's daughter Maudie Mercury had left a message asking if the prescription for Lexapro 20 mg could be sent to [facility], and she also asking if her mom could get a perm while taking Ibrance.  Per Dr. Delton Coombes, the pt can get a perm while taking the Ibrance.  Also, the Lexapro 20 mg prescription was routed to Faythe Casa, NP, to sign off.  I called Kim back and she verbalized understanding.

## 2021-01-10 ENCOUNTER — Other Ambulatory Visit (HOSPITAL_COMMUNITY): Payer: Self-pay

## 2021-01-10 MED ORDER — ESCITALOPRAM OXALATE 20 MG PO TABS
10.0000 mg | ORAL_TABLET | Freq: Every day | ORAL | 2 refills | Status: DC
Start: 2021-01-10 — End: 2021-03-26

## 2021-01-10 MED ORDER — ESCITALOPRAM OXALATE 20 MG PO TABS: 20.0000 mg | ORAL_TABLET | Freq: Every day | ORAL | 3 refills | Status: DC

## 2021-01-15 ENCOUNTER — Ambulatory Visit: Payer: Medicare Other | Admitting: Physician Assistant

## 2021-01-25 ENCOUNTER — Telehealth (HOSPITAL_COMMUNITY): Payer: Self-pay | Admitting: Pharmacy Technician

## 2021-01-25 NOTE — Telephone Encounter (Signed)
Oral Oncology Patient Advocate Encounter  Received notification from [employer] Patient Assistance program that patient has been successfully enrolled into their program to receive Ibrance from the manufacturer at $0 out of pocket until 11/24/21.    I will call the patients daughter, Maudie Mercury, to let her know of the approval and when to re-apply.   Specialty Pharmacy that will dispense medication is [facility].  Patient knows to call the office with questions or concerns.   Oral Oncology Clinic will continue to follow.  [place] Patient [place] Phone [phone] Fax [phone] 01/25/2021 8:54 AM

## 2021-01-30 ENCOUNTER — Other Ambulatory Visit: Payer: Self-pay

## 2021-01-30 ENCOUNTER — Inpatient Hospital Stay (HOSPITAL_COMMUNITY): Payer: Medicare Other

## 2021-01-30 ENCOUNTER — Inpatient Hospital Stay (HOSPITAL_COMMUNITY): Payer: Medicare Other | Attending: Hematology | Admitting: Hematology

## 2021-01-30 VITALS — BP 136/84 | HR 90 | Temp 97.2°F | Resp 18 | Wt 156.8 lb

## 2021-01-30 DIAGNOSIS — R197 Diarrhea, unspecified: Secondary | ICD-10-CM

## 2021-01-30 DIAGNOSIS — M25551 Pain in right hip: Secondary | ICD-10-CM | POA: Insufficient documentation

## 2021-01-30 DIAGNOSIS — Z87891 Personal history of nicotine dependence: Secondary | ICD-10-CM | POA: Diagnosis not present

## 2021-01-30 DIAGNOSIS — Z79899 Other long term (current) drug therapy: Secondary | ICD-10-CM | POA: Diagnosis not present

## 2021-01-30 DIAGNOSIS — C50919 Malignant neoplasm of unspecified site of unspecified female breast: Secondary | ICD-10-CM

## 2021-01-30 DIAGNOSIS — I2510 Atherosclerotic heart disease of native coronary artery without angina pectoris: Secondary | ICD-10-CM | POA: Insufficient documentation

## 2021-01-30 DIAGNOSIS — Z170 Estrogen receptor positive status [ER+]: Secondary | ICD-10-CM | POA: Diagnosis not present

## 2021-01-30 DIAGNOSIS — C50212 Malignant neoplasm of upper-inner quadrant of left female breast: Secondary | ICD-10-CM

## 2021-01-30 DIAGNOSIS — Z853 Personal history of malignant neoplasm of breast: Secondary | ICD-10-CM

## 2021-01-30 DIAGNOSIS — Z79811 Long term (current) use of aromatase inhibitors: Secondary | ICD-10-CM | POA: Insufficient documentation

## 2021-01-30 DIAGNOSIS — F32A Depression, unspecified: Secondary | ICD-10-CM | POA: Insufficient documentation

## 2021-01-30 DIAGNOSIS — R110 Nausea: Secondary | ICD-10-CM | POA: Insufficient documentation

## 2021-01-30 DIAGNOSIS — Z923 Personal history of irradiation: Secondary | ICD-10-CM | POA: Insufficient documentation

## 2021-01-30 DIAGNOSIS — C7951 Secondary malignant neoplasm of bone: Secondary | ICD-10-CM | POA: Insufficient documentation

## 2021-01-30 DIAGNOSIS — Z7902 Long term (current) use of antithrombotics/antiplatelets: Secondary | ICD-10-CM | POA: Insufficient documentation

## 2021-01-30 DIAGNOSIS — F419 Anxiety disorder, unspecified: Secondary | ICD-10-CM | POA: Diagnosis not present

## 2021-01-30 LAB — COMPREHENSIVE METABOLIC PANEL
ALT: 10 U/L (ref 0–44)
AST: 16 U/L (ref 15–41)
Albumin: 4.1 g/dL (ref 3.5–5.0)
Alkaline Phosphatase: 37 U/L — ABNORMAL LOW (ref 38–126)
Anion gap: 9 (ref 5–15)
BUN: 9 mg/dL (ref 8–23)
CO2: 25 mmol/L (ref 22–32)
Calcium: 8.5 mg/dL — ABNORMAL LOW (ref 8.9–10.3)
Chloride: 105 mmol/L (ref 98–111)
Creatinine, Ser: 0.7 mg/dL (ref 0.44–1.00)
GFR, Estimated: >60 mL/min (ref >60)
Glucose, Bld: 93 mg/dL (ref 70–99)
Potassium: 3.5 mmol/L (ref 3.5–5.1)
Sodium: 139 mmol/L (ref 135–145)
Total Bilirubin: 0.6 mg/dL (ref 0.3–1.2)
Total Protein: 7.3 g/dL (ref 6.5–8.1)

## 2021-01-30 LAB — CBC WITH DIFFERENTIAL/PLATELET
Abs Immature Granulocytes: 0.01 10*3/uL (ref 0.00–0.07)
Basophils Absolute: 0.1 10*3/uL (ref 0.0–0.1)
Basophils Relative: 1 %
Eosinophils Absolute: 0.1 10*3/uL (ref 0.0–0.5)
Eosinophils Relative: 2 %
HCT: 39.3 % (ref 36.0–46.0)
Hemoglobin: 12.4 g/dL (ref 12.0–15.0)
Immature Granulocytes: 0 %
Lymphocytes Relative: 50 %
Lymphs Abs: 2.1 10*3/uL (ref 0.7–4.0)
MCH: 31.9 pg (ref 26.0–34.0)
MCHC: 31.6 g/dL (ref 30.0–36.0)
MCV: 101 fL — ABNORMAL HIGH (ref 80.0–100.0)
Monocytes Absolute: 0.3 10*3/uL (ref 0.1–1.0)
Monocytes Relative: 7 %
Neutro Abs: 1.7 10*3/uL (ref 1.7–7.7)
Neutrophils Relative %: 40 %
Platelets: 219 10*3/uL (ref 150–400)
RBC: 3.89 MIL/uL (ref 3.87–5.11)
RDW: 16.4 % — ABNORMAL HIGH (ref 11.5–15.5)
WBC: 4.2 10*3/uL (ref 4.0–10.5)
nRBC: 0 % (ref 0.0–0.2)

## 2021-01-30 MED ORDER — DENOSUMAB 120 MG/1.7ML ~~LOC~~ SOLN
SUBCUTANEOUS | Status: AC
  Filled 2021-01-30: qty 1.7

## 2021-01-30 MED ORDER — DENOSUMAB 120 MG/1.7ML ~~LOC~~ SOLN
120.0000 mg | Freq: Once | SUBCUTANEOUS | Status: AC
  Administered 2021-01-30: 120 mg via SUBCUTANEOUS

## 2021-01-30 NOTE — Progress Notes (Signed)
[facility] [address], [place] [address]   Patient Care Team: Celene Squibb, MD as PCP - General (Internal Medicine) Troy Sine, MD as PCP - Cardiology (Cardiology) Gala Romney Cristopher Estimable, MD (Gastroenterology) Brien Mates, RN as Oncology Nurse Navigator (Oncology)  SUMMARY OF ONCOLOGIC HISTORY: Oncology History   No history exists.    CHIEF COMPLIANT: Follow-up for left breast cancer   INTERVAL HISTORY: [name] is a 87 y.o. female here today for follow up of her left breast cancer. Her last visit was with Faythe Casa, NP, on 01/02/2021.   Today she is accompanied by her daughter and she reports feeling fair. She is having diarrhea 6 to 7 times per day which started with Ibrance. She is taking Ibrance 75 mg daily for 3 of 4 weeks. She complains of being fatigued every day. She continues having intermittent chest wall pain beneath her breasts. She is taking Lexapro 20 mg. She does not require Zofran daily. Her appetite is good and alternates daily; she drinks 1 can of Ensure every other day.  She is staying sedentary most of the day.   REVIEW OF SYSTEMS:   Review of Systems  Constitutional: Positive for appetite change (50%) and fatigue (depleted; daily).  Cardiovascular: Positive for chest pain (chest wall pain bilat beneath breasts).  Gastrointestinal: Positive for diarrhea (x6-7 episodes per day).  Endocrine: Positive for hot flashes.  Genitourinary: Positive for nocturia.   Neurological: Positive for numbness (fingers).  Psychiatric/Behavioral: Positive for sleep disturbance.  All other systems reviewed and are negative.   I have reviewed the past medical history, past surgical history, social history and family history with the patient and they are unchanged from previous note.   ALLERGIES:   is allergic to contrast media [iodinated diagnostic agents], lipitor [atorvastatin], sulfamethoxazole, and sulfonamide  derivatives.   MEDICATIONS:  Current Outpatient Medications  Medication Sig Dispense Refill  . anastrozole (ARIMIDEX) 1 MG tablet TAKE 1 TABLET BY MOUTH EVERY DAY 90 tablet 2  . aspirin EC 81 MG EC tablet Take 2 tablets (162 mg total) by mouth daily. Swallow whole. 60 tablet 2  . carboxymethylcellulose (REFRESH PLUS) 0.5 % SOLN Place 3-4 drops into both eyes 3 (three) times daily as needed (dry eyes). Preservative free    . clopidogrel (PLAVIX) 75 MG tablet Take 75 mg by mouth daily.     . clorazepate (TRANXENE) 15 MG tablet Take 1 tablet (15 mg total) by mouth daily. 90 tablet 0  . escitalopram (LEXAPRO) 20 MG tablet Take 1 tablet (20 mg total) by mouth daily. 30 tablet 3  . escitalopram (LEXAPRO) 20 MG tablet Take 0.5 tablets (10 mg total) by mouth daily. 30 tablet 2  . ezetimibe (ZETIA) 10 MG tablet Take 10 mg by mouth daily.    Marland Kitchen gabapentin (NEURONTIN) 100 MG capsule TAKE 1 CAPSULE BY MOUTH THREE TIMES A DAY 90 capsule 2  . latanoprost (XALATAN) 0.005 % ophthalmic solution Place 1 drop into both eyes at bedtime.    . metoprolol tartrate (LOPRESSOR) 25 MG tablet Take 12.5 mg in a.m. and 25 mg at nighttime.    . nitroGLYCERIN (NITROSTAT) 0.4 MG SL tablet Place 1 tablet (0.4 mg total) under the tongue every 5 (five) minutes as needed. 25 tablet 3  . ondansetron (ZOFRAN) 8 MG tablet Take 1 tablet (8 mg total) by mouth every 8 (eight) hours as needed for nausea or vomiting. 20 tablet 0  . palbociclib (IBRANCE)  75 MG tablet Take 1 tablet (75 mg total) by mouth daily. Take for 21 days on, 7 days off, repeat every 28 days. 21 tablet 3  . pantoprazole (PROTONIX) 40 MG tablet Take 1 tablet (40 mg total) by mouth daily before breakfast. 30 tablet 1  . acetaminophen (TYLENOL) 325 MG tablet Take 2 tablets (650 mg total) by mouth every 6 (six) hours as needed for mild pain or headache (or temp > 37.5 C (99.5 F)). (Patient not taking: Reported on 01/30/2021) 40 tablet 0  . HYDROcodone-acetaminophen (NORCO)  5-325 MG tablet Take 1 tablet by mouth every 4 (four) hours as needed for moderate pain. (Patient not taking: Reported on 01/30/2021) 180 tablet 0   No current facility-administered medications for this visit.     PHYSICAL EXAMINATION: Performance status (ECOG): 1 - Symptomatic but completely ambulatory  Vitals:   01/30/21 0854  BP: 136/84  Pulse: 90  Resp: 18  Temp: (!) 97.2 F (36.2 C)  SpO2: 94%   Wt Readings from Last 3 Encounters:  01/30/21 156 lb 12.8 oz (71.1 kg)  01/02/21 157 lb 13.6 oz (71.6 kg)  10/19/20 144 lb 10 oz (65.6 kg)   Physical Exam Vitals reviewed.  Constitutional:      Appearance: Normal appearance.  HENT:     Mouth/Throat:     Lips: No lesions.     Mouth: No oral lesions.     Dentition: No gum lesions.     Tongue: No lesions.     Palate: No mass.     Pharynx: No posterior oropharyngeal erythema.     Tonsils: No tonsillar exudate or tonsillar abscesses.  Cardiovascular:     Rate and Rhythm: Normal rate and regular rhythm.     Pulses: Normal pulses.     Heart sounds: Normal heart sounds.  Pulmonary:     Effort: Pulmonary effort is normal.     Breath sounds: Normal breath sounds.  Chest:     Chest wall: Tenderness (soreness on bilat lower anterior ribs) present.  Abdominal:     Palpations: Abdomen is soft. There is no hepatomegaly, splenomegaly or mass.     Tenderness: There is no abdominal tenderness.     Hernia: No hernia is present.  Musculoskeletal:     Right lower leg: No edema.     Left lower leg: No edema.  Neurological:     General: No focal deficit present.     Mental Status: She is alert and oriented to person, place, and time.  Psychiatric:        Mood and Affect: Mood normal.        Behavior: Behavior normal.     Breast Exam Chaperone: Milinda Antis, MD     LABORATORY DATA:  I have reviewed the data as listed CMP Latest Ref Rng & Units 01/30/2021 01/02/2021 12/07/2020  Glucose 70 - 99 mg/dL 93 119(H) 115(H)  BUN 8 - 23  mg/dL 9 10 13   Creatinine 0.44 - 1.00 mg/dL 0.70 0.66 0.68  Sodium 135 - 145 mmol/L 139 139 137  Potassium 3.5 - 5.1 mmol/L 3.5 3.8 3.8  Chloride 98 - 111 mmol/L 105 105 105  CO2 22 - 32 mmol/L 25 28 24   Calcium 8.9 - 10.3 mg/dL 8.5(L) 9.0 8.6(L)  Total Protein 6.5 - 8.1 g/dL 7.3 7.4 7.0  Total Bilirubin 0.3 - 1.2 mg/dL 0.6 0.6 0.5  Alkaline Phos 38 - 126 U/L 37(L) 41 51  AST 15 - 41 U/L 16 17 21  ALT 0 - 44 U/L 10 14 14    Lab Results  Component Value Date   CAN153 32.2 (H) 01/02/2021   CAN153 30.9 (H) 12/07/2020   CAN153 45.8 (H) 10/11/2020   Lab Results  Component Value Date   WBC 4.2 01/30/2021   HGB 12.4 01/30/2021   HCT 39.3 01/30/2021   MCV 101.0 (H) 01/30/2021   PLT 219 01/30/2021   NEUTROABS 1.7 01/30/2021    ASSESSMENT:  1. T2N0 left breast infiltrating lobular carcinoma: -Status post lumpectomy in June 2014, 1.4 cm, grade 1, lymphovascular invasion positive, ER 100%, PR 26%, Ki-67 18%. -She underwent XRT.She did not take adjuvant endocrine therapy. -Mammogram on 07/13/2020, BI-RADS Category 1. -CEA was 10.3, Ca1 2515.8 and CA 19-9 05. -PET scan on 08/07/2020 shows widespread hypermetabolic bone meta stasis with soft tissue components at T1 and sacrum. Thoracic nodal metastasis. -MRI of the thoracic spine on 08/15/2020 shows pathological fracture of T1 with mild osseous retropulsion and possible left C8 nerve root encroachment within the foramen at C7-T1. No cord deformity. -Right third rib biopsy on 08/22/2020 consistent with metastatic breast cancer, ER 100% positive, PR 40% positive, Ki-67 10%, HER-2 2+ and negative by FISH. -Ibrance 100 mg 3 weeks on 1 week off on anastrozole 1 mg daily started on 08/31/2020. -Ibrance held on 10/13/2020 secondary to severe tiredness.  2. Social/family history: -She quit smoking 20 years ago, smoked 2 packs/day for more than 20 years prior to quitting. -She lives at home by herself and is independent of all  activities. -Daughter who has multiple myeloma at age 43.   PLAN:  1.Metastatic cancer to the bones, highly likely breast cancer: -Continue anastrozole without any major problems. -She restarted back Ibrance at last visit around 12/30/2020.  She reports some fatigue.  Also reported diarrhea.  We are working up diarrhea. -Reviewed her labs today.  White count is 4.2 with ANC of 1700.  Platelet count is normal.  Hemoglobin was completely normal.  Tumor markers CA 15-3 has been downtrending at 32.2. -Recommend continuing Ibrutinib at 75 mg 3 weeks on 1 week off. -Recommend PET CT scan in 3 to [redacted] weeks along with tumor markers.  2. CAD and RCA stenting: -Continue metoprolol and Plavix.  3.Right hip pain: -She has occasional bilateral rib pains.  Overall these have improved since start of therapy. -Right hip pain has also improved.  Continue hydrocodone 5 mg as needed.  4. Bone metastasis: -Continue denosumab monthly.  Calcium is normal.  5.  Depression/anxiety: -Continue Lexapro 10 mg daily.  Continue clorazepate 15 mg at bedtime as needed.  6.  Diarrhea: -She reports watery diarrhea about 6-7 episodes per day.  She is taking 1 tablet of Imodium. -She reports diarrhea started on and off since Ibrance was started. -We will check stool studies for C. difficile and GI panel. -She is also drinking Ensure 1 can every other day which could also be contributing to diarrhea.  She was told to hold off drinking Ensure for 1 week. As she was told to take Imodium 2 tablets at the first onset of diarrhea followed by 1 tablet after each watery bowel movement.    No orders of the defined types were placed in this encounter.  The patient has a good understanding of the overall plan. she agrees with it. she will call with any problems that may develop before the next visit here.    Derek Jack, MD [facility] [phone]   I, Milinda Antis, am  acting as a  scribe for Dr. Sanda Linger.  I, Derek Jack MD, have reviewed the above documentation for accuracy and completeness, and I agree with the above.

## 2021-01-30 NOTE — Progress Notes (Signed)
Patient taking calcium as directed.  Denied tooth, jaw, and leg pain.  No recent or upcoming dental visits.  Labs reviewed.  Patient tolerated injection with no complaints voiced.  See MAR for details.  Patient stable during and after injection.  Site clean and dry with no bruising or swelling noted.  Band aid applied.  Vss with discharge and left ambulatory with no s/s of distress noted.  

## 2021-01-30 NOTE — Patient Instructions (Signed)
[place] at [facility] Discharge Instructions  You were seen today by Dr. Delton Coombes. He went over your recent results and scans. You received your Xgeva injection today. Take 2 tablets of Imodium in the morning after the first watery bowel movement and 1 tablet after every watery bowel movement. You will be scheduled to have a PET scan done before your next visit. Dr. Delton Coombes will see you back in 1 month for labs and follow up.   Thank you for choosing [place] at [facility] to provide your oncology and hematology care.  To afford each patient quality time with our provider, please arrive at least 15 minutes before your scheduled appointment time.   If you have a lab appointment with the [facility] please come in thru the Main Entrance and check in at the main information desk  You need to re-schedule your appointment should you arrive 10 or more minutes late.  We strive to give you quality time with our providers, and arriving late affects you and other patients whose appointments are after yours.  Also, if you no show three or more times for appointments you may be dismissed from the clinic at the providers discretion.     Again, thank you for choosing [facility].  Our hope is that these requests will decrease the amount of time that you wait before being seen by our physicians.       _____________________________________________________________  Should you have questions after your visit to [facility], please contact our office at [phone] between the hours of 8:00 a.m. and 4:30 p.m.  Voicemails left after 4:00 p.m. will not be returned until the following business day.  For prescription refill requests, have your pharmacy contact our office and allow 72 hours.    Cancer Center Support Programs:   > Cancer Support Group  2nd Tuesday of the month 1pm-2pm, Journey Room

## 2021-01-30 NOTE — Progress Notes (Signed)
Patient was assessed by Dr. Delton Coombes and labs have been reviewed.  Okay to proceed with Xgeva injection today per Dr. Delton Coombes. Primary RN and pharmacy aware.

## 2021-01-31 LAB — CANCER ANTIGEN 27.29: CA 27.29: 27.8 U/mL (ref 0.0–38.6)

## 2021-01-31 LAB — CANCER ANTIGEN 15-3: CA 15-3: 28.7 U/mL — ABNORMAL HIGH (ref 0.0–25.0)

## 2021-02-05 ENCOUNTER — Other Ambulatory Visit: Payer: Self-pay | Admitting: Cardiovascular Disease

## 2021-02-16 ENCOUNTER — Other Ambulatory Visit (HOSPITAL_COMMUNITY): Payer: Self-pay | Admitting: Oncology

## 2021-02-16 DIAGNOSIS — C50919 Malignant neoplasm of unspecified site of unspecified female breast: Secondary | ICD-10-CM

## 2021-02-16 DIAGNOSIS — Z170 Estrogen receptor positive status [ER+]: Secondary | ICD-10-CM

## 2021-02-16 DIAGNOSIS — C50212 Malignant neoplasm of upper-inner quadrant of left female breast: Secondary | ICD-10-CM

## 2021-02-19 ENCOUNTER — Encounter (HOSPITAL_COMMUNITY)
Admission: RE | Admit: 2021-02-19 | Discharge: 2021-02-19 | Disposition: A | Discharge status: Home / Self Care | Payer: Medicare Other | Source: Ambulatory Visit | Attending: Hematology | Admitting: Hematology

## 2021-02-19 ENCOUNTER — Other Ambulatory Visit: Payer: Self-pay

## 2021-02-19 DIAGNOSIS — Z170 Estrogen receptor positive status [ER+]: Secondary | ICD-10-CM | POA: Diagnosis not present

## 2021-02-19 DIAGNOSIS — C50212 Malignant neoplasm of upper-inner quadrant of left female breast: Secondary | ICD-10-CM | POA: Diagnosis not present

## 2021-02-19 DIAGNOSIS — R197 Diarrhea, unspecified: Secondary | ICD-10-CM | POA: Insufficient documentation

## 2021-02-19 DIAGNOSIS — Z853 Personal history of malignant neoplasm of breast: Secondary | ICD-10-CM | POA: Insufficient documentation

## 2021-02-19 DIAGNOSIS — C50919 Malignant neoplasm of unspecified site of unspecified female breast: Secondary | ICD-10-CM | POA: Diagnosis not present

## 2021-02-19 IMAGING — PT NM PET TUM IMG RESTAG (PS) SKULL BASE T - THIGH
1 of 7 series · 2 of 52 positions shown · non-contrast
Comparison: [DATE]

CLINICAL DATA: Subsequent treatment strategy for breast cancer.

EXAM:
NUCLEAR MEDICINE PET SKULL BASE TO THIGH
TECHNIQUE: 9.6 mCi F-18 FDG was injected intravenously. Full-ring PET imaging
was performed from the skull base to thigh after the radiotracer. CT
data was obtained and used for attenuation correction and anatomic
localization.
Fasting blood glucose: 101 mg/dl

[Series 3: ct wb fusion · axial · 5.0mm · 0.98mm/px · z∈[-1380,-735]mm · 2 of 387 slices shown]
[im 129/387  brain]
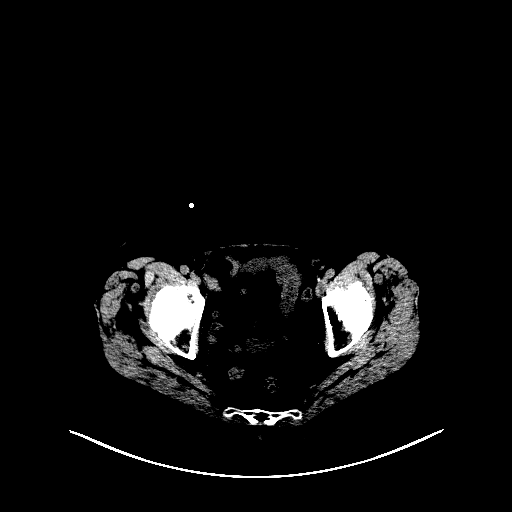
[im 387/387  brain]
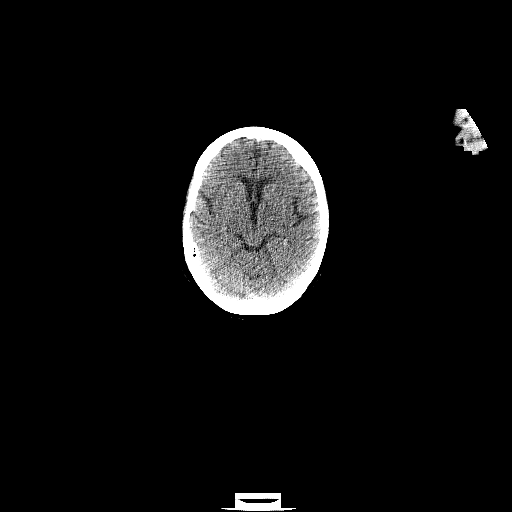

[2 of 52 positions shown; findings below may reference images not displayed]

FINDINGS: Mediastinal blood pool activity: SUV max

Liver activity: SUV max NA

NECK: No hypermetabolic lymph nodes in the neck.

Incidental CT findings: none

CHEST: No FDG avid supraclavicular, axillary, mediastinal, or hilar
lymph nodes.

Within the anterior mediastinal fat there is residual soft tissue
stranding with SUV max of 2.20, image 94/3. Previously noted FDG
avid prevascular and left internal mammary lymph nodes have
resolved.

Near complete resolution of previous FDG avid right paratracheal
adenopathy.

-Index lymph node within the right paratracheal chain measures 7 mm
with SUV max of 2.4. Previously 1.5 cm with SUV max of 5.9.

-Focal nodular area of pleural thickening within the lateral right
base measures 2.0 x 0.8 cm and has an SUV max of 3.4. This is a new
finding compared with previous exam.

Incidental CT findings: Aortic atherosclerosis with coronary artery
calcifications. No pericardial effusion.

ABDOMEN/PELVIS: No abnormal hypermetabolic activity within the
liver, pancreas, adrenal glands, or spleen. No hypermetabolic lymph
nodes in the abdomen or pelvis.

Incidental CT findings: Aortic atherosclerosis. No pericardial
effusion identified.

SKELETON: Interval improvement in widespread osseous metastasis.

Index lesion involving the posterior third right rib 1.51 with
resolution of previous expansile soft tissue component, image 78/3.

Lytic lesion involving the T1 vertebra measures 1.9 cm and has an
SUV max of 1.83, image 62/3. Previously 2.8 cm within SUV max of
10.8.

Right femoral lesser trochanter lesion measures 2.4 cm with SUV max
of 2.13, image 291/3.

Index lesion within the central sacrum measures 2.6 x 1.6 cm and has
an SUV max of 2.5. Previously 2.6 x 2.0 cm with SUV max of 7.2.

Incidental CT findings: none
IMPRESSION: 1. Interval response to therapy. Previous index skeletal lesions
have decreased in size and degree of FDG uptake.
2. Resolution of previous FDG avid mediastinal and left internal
mammary lymph nodes.
3. New small focal area of subpleural nodularity within the lateral
right base exhibits mild FDG uptake. Cannot exclude new site of
disease. Attention on follow-up imaging is advised.

## 2021-02-19 MED ORDER — FLUDEOXYGLUCOSE F - 18 (FDG) INJECTION
9.6400 | Freq: Once | INTRAVENOUS | Status: AC | PRN
  Administered 2021-02-19: 9.64 via INTRAVENOUS

## 2021-02-20 ENCOUNTER — Other Ambulatory Visit (HOSPITAL_COMMUNITY): Payer: Self-pay

## 2021-02-27 ENCOUNTER — Inpatient Hospital Stay (HOSPITAL_COMMUNITY): Payer: Medicare Other

## 2021-02-27 ENCOUNTER — Other Ambulatory Visit: Payer: Self-pay

## 2021-02-27 ENCOUNTER — Inpatient Hospital Stay (HOSPITAL_COMMUNITY): Payer: Medicare Other | Attending: Hematology | Admitting: Hematology

## 2021-02-27 VITALS — BP 146/72 | HR 85 | Temp 96.8°F | Resp 20 | Wt 158.8 lb

## 2021-02-27 DIAGNOSIS — F32A Depression, unspecified: Secondary | ICD-10-CM | POA: Insufficient documentation

## 2021-02-27 DIAGNOSIS — I2510 Atherosclerotic heart disease of native coronary artery without angina pectoris: Secondary | ICD-10-CM | POA: Insufficient documentation

## 2021-02-27 DIAGNOSIS — C7951 Secondary malignant neoplasm of bone: Secondary | ICD-10-CM | POA: Insufficient documentation

## 2021-02-27 DIAGNOSIS — Z7902 Long term (current) use of antithrombotics/antiplatelets: Secondary | ICD-10-CM | POA: Diagnosis not present

## 2021-02-27 DIAGNOSIS — R197 Diarrhea, unspecified: Secondary | ICD-10-CM | POA: Diagnosis not present

## 2021-02-27 DIAGNOSIS — Z87891 Personal history of nicotine dependence: Secondary | ICD-10-CM | POA: Insufficient documentation

## 2021-02-27 DIAGNOSIS — Z170 Estrogen receptor positive status [ER+]: Secondary | ICD-10-CM

## 2021-02-27 DIAGNOSIS — F419 Anxiety disorder, unspecified: Secondary | ICD-10-CM | POA: Insufficient documentation

## 2021-02-27 DIAGNOSIS — Z79899 Other long term (current) drug therapy: Secondary | ICD-10-CM | POA: Insufficient documentation

## 2021-02-27 DIAGNOSIS — M25551 Pain in right hip: Secondary | ICD-10-CM | POA: Insufficient documentation

## 2021-02-27 DIAGNOSIS — Z923 Personal history of irradiation: Secondary | ICD-10-CM | POA: Insufficient documentation

## 2021-02-27 DIAGNOSIS — Z853 Personal history of malignant neoplasm of breast: Secondary | ICD-10-CM

## 2021-02-27 DIAGNOSIS — Z79811 Long term (current) use of aromatase inhibitors: Secondary | ICD-10-CM | POA: Insufficient documentation

## 2021-02-27 DIAGNOSIS — C50212 Malignant neoplasm of upper-inner quadrant of left female breast: Secondary | ICD-10-CM

## 2021-02-27 DIAGNOSIS — C50919 Malignant neoplasm of unspecified site of unspecified female breast: Secondary | ICD-10-CM

## 2021-02-27 LAB — CBC WITH DIFFERENTIAL/PLATELET
Abs Immature Granulocytes: 0.01 10*3/uL (ref 0.00–0.07)
Basophils Absolute: 0.1 10*3/uL (ref 0.0–0.1)
Basophils Relative: 2 %
Eosinophils Absolute: 0.1 10*3/uL (ref 0.0–0.5)
Eosinophils Relative: 2 %
HCT: 35.3 % — ABNORMAL LOW (ref 36.0–46.0)
Hemoglobin: 11.5 g/dL — ABNORMAL LOW (ref 12.0–15.0)
Immature Granulocytes: 0 %
Lymphocytes Relative: 45 %
Lymphs Abs: 1.5 10*3/uL (ref 0.7–4.0)
MCH: 33.2 pg (ref 26.0–34.0)
MCHC: 32.6 g/dL (ref 30.0–36.0)
MCV: 102 fL — ABNORMAL HIGH (ref 80.0–100.0)
Monocytes Absolute: 0.2 10*3/uL (ref 0.1–1.0)
Monocytes Relative: 7 %
Neutro Abs: 1.4 10*3/uL — ABNORMAL LOW (ref 1.7–7.7)
Neutrophils Relative %: 44 %
Platelets: 151 10*3/uL (ref 150–400)
RBC: 3.46 MIL/uL — ABNORMAL LOW (ref 3.87–5.11)
RDW: 17.1 % — ABNORMAL HIGH (ref 11.5–15.5)
WBC: 3.2 10*3/uL — ABNORMAL LOW (ref 4.0–10.5)
nRBC: 0 % (ref 0.0–0.2)

## 2021-02-27 LAB — COMPREHENSIVE METABOLIC PANEL
ALT: 9 U/L (ref 0–44)
AST: 16 U/L (ref 15–41)
Albumin: 3.7 g/dL (ref 3.5–5.0)
Alkaline Phosphatase: 37 U/L — ABNORMAL LOW (ref 38–126)
Anion gap: 10 (ref 5–15)
BUN: 10 mg/dL (ref 8–23)
CO2: 24 mmol/L (ref 22–32)
Calcium: 8.5 mg/dL — ABNORMAL LOW (ref 8.9–10.3)
Chloride: 106 mmol/L (ref 98–111)
Creatinine, Ser: 0.64 mg/dL (ref 0.44–1.00)
GFR, Estimated: >60 mL/min (ref >60)
Glucose, Bld: 90 mg/dL (ref 70–99)
Potassium: 4 mmol/L (ref 3.5–5.1)
Sodium: 140 mmol/L (ref 135–145)
Total Bilirubin: 0.6 mg/dL (ref 0.3–1.2)
Total Protein: 6.9 g/dL (ref 6.5–8.1)

## 2021-02-27 MED ORDER — DENOSUMAB 120 MG/1.7ML ~~LOC~~ SOLN
SUBCUTANEOUS | Status: AC
  Filled 2021-02-27: qty 1.7

## 2021-02-27 MED ORDER — DENOSUMAB 120 MG/1.7ML ~~LOC~~ SOLN: 120.0000 mg | Freq: Once | SUBCUTANEOUS | Status: DC

## 2021-02-27 NOTE — Progress Notes (Signed)
[facility] [address] [place] [address]   Patient Care Team: Celene Squibb, MD as PCP - General (Internal Medicine) Troy Sine, MD as PCP - Cardiology (Cardiology) Gala Romney Cristopher Estimable, MD (Gastroenterology) Brien Mates, RN as Oncology Nurse Navigator (Oncology)  SUMMARY OF ONCOLOGIC HISTORY: Oncology History   No history exists.    CHIEF COMPLIANT: Follow-up for left breast cancer   INTERVAL HISTORY: Kara Woods is a 87 y.o. female here today for follow up of her left breast cancer. Her last visit was on 01/30/2021.   Today she is accompanied by her daughter and she reports feeling okay. She stopped taking Ibrance on 04/03. She complains of having swelling in her feet. She stopped drinking Ensure but continues having multiple watery BM's and even incontinence; she still only takes 1 tablet of Imodium per day. Today she reports having constipation. She continues taking Arimidex daily. She has been having tremors in her hands, more in the right, and in her jaw which has been constant for the past several weeks.   REVIEW OF SYSTEMS:   Review of Systems  Constitutional: Positive for appetite change (50%) and fatigue (depleted).  HENT:   Positive for mouth sores (soreness on L upper gum line).   Cardiovascular: Positive for leg swelling.  Gastrointestinal: Positive for constipation and diarrhea (multiple watery BM's daily).  Neurological: Positive for numbness (fingers).       Tremors  Psychiatric/Behavioral: Positive for sleep disturbance (at times).  All other systems reviewed and are negative.   I have reviewed the past medical history, past surgical history, social history and family history with the patient and they are unchanged from previous note.   ALLERGIES:   is allergic to contrast media [iodinated diagnostic agents], lipitor [atorvastatin], sulfamethoxazole, and sulfonamide derivatives.   MEDICATIONS:  Current Outpatient  Medications  Medication Sig Dispense Refill  . anastrozole (ARIMIDEX) 1 MG tablet TAKE 1 TABLET BY MOUTH EVERY DAY 90 tablet 2  . aspirin EC 81 MG EC tablet Take 2 tablets (162 mg total) by mouth daily. Swallow whole. 60 tablet 2  . clopidogrel (PLAVIX) 75 MG tablet Take 75 mg by mouth daily.     . clorazepate (TRANXENE) 15 MG tablet Take 1 tablet (15 mg total) by mouth daily. 90 tablet 0  . escitalopram (LEXAPRO) 20 MG tablet Take 0.5 tablets (10 mg total) by mouth daily. 30 tablet 2  . escitalopram (LEXAPRO) 20 MG tablet TAKE 1 TABLET BY MOUTH EVERY DAY 90 tablet 2  . ezetimibe (ZETIA) 10 MG tablet TAKE 1 TABLET BY MOUTH EVERY DAY 90 tablet 0  . gabapentin (NEURONTIN) 100 MG capsule TAKE 1 CAPSULE BY MOUTH THREE TIMES A DAY 90 capsule 2  . latanoprost (XALATAN) 0.005 % ophthalmic solution Place 1 drop into both eyes at bedtime.    . metoprolol tartrate (LOPRESSOR) 25 MG tablet TAKE 1/2 TABLET BY MOUTH TWICE A DAY 90 tablet 0  . palbociclib (IBRANCE) 75 MG tablet Take 1 tablet (75 mg total) by mouth daily. Take for 21 days on, 7 days off, repeat every 28 days. 21 tablet 3  . pantoprazole (PROTONIX) 40 MG tablet Take 1 tablet (40 mg total) by mouth daily before breakfast. 30 tablet 1  . acetaminophen (TYLENOL) 325 MG tablet Take 2 tablets (650 mg total) by mouth every 6 (six) hours as needed for mild pain or headache (or temp > 37.5 C (99.5 F)). (Patient not taking: Reported on  02/27/2021) 40 tablet 0  . carboxymethylcellulose (REFRESH PLUS) 0.5 % SOLN Place 3-4 drops into both eyes 3 (three) times daily as needed (dry eyes). Preservative free (Patient not taking: Reported on 02/27/2021)    . HYDROcodone-acetaminophen (NORCO) 5-325 MG tablet Take 1 tablet by mouth every 4 (four) hours as needed for moderate pain. (Patient not taking: Reported on 02/27/2021) 180 tablet 0  . nitroGLYCERIN (NITROSTAT) 0.4 MG SL tablet Place 1 tablet (0.4 mg total) under the tongue every 5 (five) minutes as needed. (Patient  not taking: Reported on 02/27/2021) 25 tablet 3  . ondansetron (ZOFRAN) 8 MG tablet Take 1 tablet (8 mg total) by mouth every 8 (eight) hours as needed for nausea or vomiting. (Patient not taking: Reported on 02/27/2021) 20 tablet 0   Current Facility-Administered Medications  Medication Dose Route Frequency Provider Last Rate Last Admin  . denosumab (XGEVA) injection 120 mg  120 mg Subcutaneous Once Derek Jack, MD         PHYSICAL EXAMINATION: Performance status (ECOG): 1 - Symptomatic but completely ambulatory  Vitals:   02/27/21 1054  BP: (!) 146/72  Pulse: 85  Resp: 20  Temp: (!) 96.8 F (36 C)  SpO2: 95%   Wt Readings from Last 3 Encounters:  02/27/21 158 lb 12.8 oz (72 kg)  01/30/21 156 lb 12.8 oz (71.1 kg)  01/02/21 157 lb 13.6 oz (71.6 kg)   Physical Exam HENT:     Mouth/Throat:     Mouth: No oral lesions.     Dentition: No gum lesions.  Musculoskeletal:     Right lower leg: Edema (trace) present.     Left lower leg: Edema (trace) present.     Breast Exam Chaperone: Milinda Antis, MD     LABORATORY DATA:  I have reviewed the data as listed CMP Latest Ref Rng & Units 02/27/2021 01/30/2021 01/02/2021  Glucose 70 - 99 mg/dL 90 93 119(H)  BUN 8 - 23 mg/dL _0 Creatinine 0.44 - 1.00 mg/dL 0.64 0.70 0.66  Sodium 135 - 145 mmol/L 140 139 139  Potassium 3.5 - 5.1 mmol/L 4.0 3.5 3.8  Chloride 98 - 111 mmol/L 106 105 105  CO2 22 - 32 mmol/L _1 Calcium 8.9 - 10.3 mg/dL 8.5(L) 8.5(L) 9.0  Total Protein 6.5 - 8.1 g/dL 6.9 7.3 7.4  Total Bilirubin 0.3 - 1.2 mg/dL 0.6 0.6 0.6  Alkaline Phos 38 - 126 U/L 37(L) 37(L) 41  AST 15 - 41 U/L _2 ALT 0 - 44 U/L _3 Lab Results  Component Value Date   CAN153 28.7 (H) 01/30/2021   CAN153 32.2 (H) 01/02/2021   CAN153 30.9 (H) 12/07/2020   Lab Results  Component Value Date   CA2729 27.8 01/30/2021   CA2729 35.7 01/02/2021   CA2729 32.7 12/07/2020   Lab Results  Component Value Date   WBC  3.2 (L) 02/27/2021   HGB 11.5 (L) 02/27/2021   HCT 35.3 (L) 02/27/2021   MCV 102.0 (H) 02/27/2021   PLT 151 02/27/2021   NEUTROABS PENDING 02/27/2021    ASSESSMENT:  1. T2N0 left breast infiltrating lobular carcinoma: -Status post lumpectomy in June 2014, 1.4 cm, grade 1, lymphovascular invasion positive, ER 100%, PR 26%, Ki-67 18%. -She underwent XRT.She did not take adjuvant endocrine therapy. -Mammogram on 07/13/2020, BI-RADS Category 1. -CEA was 10.3, Ca1 2515.8 and CA 19-9 05. -PET scan on 08/07/2020 shows widespread hypermetabolic bone meta stasis with soft  tissue components at T1 and sacrum. Thoracic nodal metastasis. -MRI of the thoracic spine on 08/15/2020 shows pathological fracture of T1 with mild osseous retropulsion and possible left C8 nerve root encroachment within the foramen at C7-T1. No cord deformity. -Right third rib biopsy on 08/22/2020 consistent with metastatic breast cancer, ER 100% positive, PR 40% positive, Ki-67 10%, HER-2 2+ and negative by FISH. -Ibrance 100 mg 3 weeks on 1 week off on anastrozole 1 mg daily started on 08/31/2020. -Ibrance held on 10/13/2020 secondary to severe tiredness.  2. Social/family history: -She quit smoking 20 years ago, smoked 2 packs/day for more than 20 years prior to quitting. -She lives at home by herself and is independent of all activities. -Daughter who has multiple myeloma at age 77.   PLAN:  1.Metastatic cancer to the bones, highly likely breast cancer: -She is tolerating anastrozole without any major problems. -She is taking Ibrance 75 mg 3 weeks on 1 week off.  She just finished her bottle. -Reviewed labs from today which showed white count 3.2 with ANC 1.4.  Platelet count normal.  LFTs are normal. -Reviewed PET scan images from 02/11/2021 which showed excellent response with near complete resolution of previous findings in the bones and lymph nodes. -As she is having diarrhea, will hold Ibrance at this time.  Will  reevaluate in 4 weeks.  2. CAD and RCA stenting: -Continue metoprolol and Plavix.  3.Right hip pain: -Continue hydrocodone 5 mg as needed.  4. Bone metastasis: -She reports some soreness in the left upper gum.  No bone exposure seen. -We will hold off on Xgeva at this time.  Will reevaluate next time.  5.Depression/anxiety: -Continue Lexapro 10 mg daily.  Continue clorazepate 15 mg at bedtime as needed.  6.  Diarrhea: -She still has on and off diarrhea despite stopping Ensure. -She is taking up to 4 tablets of Imodium.  The last stool samples she submitted was well formed and hence testing was not done. -I have told her to get a diarrhea stool sample for C. difficile and GI panel testing. -I have told her to hold off on Ibrance at this time until next visit in 4 weeks.   Breast Cancer therapy associated bone loss: I have recommended calcium, Vitamin D and weight bearing exercises.   No orders of the defined types were placed in this encounter.  The patient has a good understanding of the overall plan. she agrees with it. she will call with any problems that may develop before the next visit here.    Derek Jack, MD [place] [phone]   I, Milinda Antis, am acting as a scribe for Dr. Sanda Linger.  I, Derek Jack MD, have reviewed the above documentation for accuracy and completeness, and I agree with the above.

## 2021-02-27 NOTE — Patient Instructions (Signed)
[place] at [facility] Discharge Instructions  You were seen today by Dr. Delton Coombes. He went over your recent results and scans. You did not receive your Xgeva injection today. Stop taking Ibrance to see if your diarrhea resolves until your next visit, but continue taking Arimidex daily. Collect a stool sample when you have your next watery bowel movement to analyze for any infections. Dr. Delton Coombes will see you back in 1 month for labs and follow up.   Thank you for choosing [place] at [facility] to provide your oncology and hematology care.  To afford each patient quality time with our provider, please arrive at least 15 minutes before your scheduled appointment time.   If you have a lab appointment with the [facility] please come in thru the Main Entrance and check in at the main information desk  You need to re-schedule your appointment should you arrive 10 or more minutes late.  We strive to give you quality time with our providers, and arriving late affects you and other patients whose appointments are after yours.  Also, if you no show three or more times for appointments you may be dismissed from the clinic at the providers discretion.     Again, thank you for choosing [facility].  Our hope is that these requests will decrease the amount of time that you wait before being seen by our physicians.       _____________________________________________________________  Should you have questions after your visit to [facility], please contact our office at [phone] between the hours of 8:00 a.m. and 4:30 p.m.  Voicemails left after 4:00 p.m. will not be returned until the following business day.  For prescription refill requests, have your pharmacy contact our office and allow 72 hours.    Cancer Center Support Programs:   > Cancer Support Group  2nd Tuesday of the month 1pm-2pm, Journey Room

## 2021-02-27 NOTE — Progress Notes (Signed)
Patient was assessed by Dr. Delton Coombes and labs have been reviewed. Hold Xgeva today due to mouth being sore. Primary RN and pharmacy aware.

## 2021-03-04 DIAGNOSIS — C7951 Secondary malignant neoplasm of bone: Secondary | ICD-10-CM | POA: Diagnosis not present

## 2021-03-04 DIAGNOSIS — Z79811 Long term (current) use of aromatase inhibitors: Secondary | ICD-10-CM | POA: Diagnosis not present

## 2021-03-04 DIAGNOSIS — C50212 Malignant neoplasm of upper-inner quadrant of left female breast: Secondary | ICD-10-CM | POA: Diagnosis not present

## 2021-03-04 DIAGNOSIS — I2510 Atherosclerotic heart disease of native coronary artery without angina pectoris: Secondary | ICD-10-CM | POA: Diagnosis not present

## 2021-03-04 DIAGNOSIS — F32A Depression, unspecified: Secondary | ICD-10-CM | POA: Diagnosis not present

## 2021-03-04 DIAGNOSIS — Z170 Estrogen receptor positive status [ER+]: Secondary | ICD-10-CM | POA: Diagnosis not present

## 2021-03-05 ENCOUNTER — Other Ambulatory Visit (HOSPITAL_COMMUNITY): Payer: Self-pay

## 2021-03-05 DIAGNOSIS — Z853 Personal history of malignant neoplasm of breast: Secondary | ICD-10-CM

## 2021-03-05 DIAGNOSIS — R197 Diarrhea, unspecified: Secondary | ICD-10-CM

## 2021-03-05 DIAGNOSIS — Z170 Estrogen receptor positive status [ER+]: Secondary | ICD-10-CM

## 2021-03-05 DIAGNOSIS — C50212 Malignant neoplasm of upper-inner quadrant of left female breast: Secondary | ICD-10-CM

## 2021-03-06 LAB — GASTROINTESTINAL PANEL BY PCR, STOOL (REPLACES STOOL CULTURE)

## 2021-03-26 ENCOUNTER — Other Ambulatory Visit: Payer: Self-pay

## 2021-03-26 ENCOUNTER — Inpatient Hospital Stay (HOSPITAL_BASED_OUTPATIENT_CLINIC_OR_DEPARTMENT_OTHER): Payer: Medicare Other | Admitting: Hematology

## 2021-03-26 ENCOUNTER — Encounter (HOSPITAL_COMMUNITY): Payer: Self-pay | Admitting: Hematology

## 2021-03-26 ENCOUNTER — Inpatient Hospital Stay (HOSPITAL_COMMUNITY): Payer: Medicare Other | Attending: Hematology

## 2021-03-26 ENCOUNTER — Inpatient Hospital Stay (HOSPITAL_COMMUNITY): Payer: Medicare Other

## 2021-03-26 VITALS — BP 114/62 | HR 68 | Temp 96.9°F | Resp 18 | Wt 153.6 lb

## 2021-03-26 DIAGNOSIS — F419 Anxiety disorder, unspecified: Secondary | ICD-10-CM | POA: Insufficient documentation

## 2021-03-26 DIAGNOSIS — C50212 Malignant neoplasm of upper-inner quadrant of left female breast: Secondary | ICD-10-CM

## 2021-03-26 DIAGNOSIS — R5383 Other fatigue: Secondary | ICD-10-CM | POA: Diagnosis not present

## 2021-03-26 DIAGNOSIS — C7951 Secondary malignant neoplasm of bone: Secondary | ICD-10-CM | POA: Insufficient documentation

## 2021-03-26 DIAGNOSIS — Z7902 Long term (current) use of antithrombotics/antiplatelets: Secondary | ICD-10-CM | POA: Insufficient documentation

## 2021-03-26 DIAGNOSIS — Z923 Personal history of irradiation: Secondary | ICD-10-CM | POA: Diagnosis not present

## 2021-03-26 DIAGNOSIS — Z170 Estrogen receptor positive status [ER+]: Secondary | ICD-10-CM

## 2021-03-26 DIAGNOSIS — Z79811 Long term (current) use of aromatase inhibitors: Secondary | ICD-10-CM | POA: Insufficient documentation

## 2021-03-26 DIAGNOSIS — Z79899 Other long term (current) drug therapy: Secondary | ICD-10-CM | POA: Diagnosis not present

## 2021-03-26 DIAGNOSIS — R197 Diarrhea, unspecified: Secondary | ICD-10-CM | POA: Diagnosis not present

## 2021-03-26 DIAGNOSIS — F32A Depression, unspecified: Secondary | ICD-10-CM | POA: Diagnosis not present

## 2021-03-26 DIAGNOSIS — C50919 Malignant neoplasm of unspecified site of unspecified female breast: Secondary | ICD-10-CM

## 2021-03-26 DIAGNOSIS — Z87891 Personal history of nicotine dependence: Secondary | ICD-10-CM | POA: Insufficient documentation

## 2021-03-26 DIAGNOSIS — M25511 Pain in right shoulder: Secondary | ICD-10-CM | POA: Insufficient documentation

## 2021-03-26 DIAGNOSIS — I2510 Atherosclerotic heart disease of native coronary artery without angina pectoris: Secondary | ICD-10-CM | POA: Diagnosis not present

## 2021-03-26 LAB — CBC WITH DIFFERENTIAL/PLATELET
Abs Immature Granulocytes: 0.08 10*3/uL — ABNORMAL HIGH (ref 0.00–0.07)
Basophils Absolute: 0.1 10*3/uL (ref 0.0–0.1)
Basophils Relative: 1 %
Eosinophils Absolute: 0.2 10*3/uL (ref 0.0–0.5)
Eosinophils Relative: 2 %
HCT: 39.3 % (ref 36.0–46.0)
Hemoglobin: 12.7 g/dL (ref 12.0–15.0)
Immature Granulocytes: 1 %
Lymphocytes Relative: 27 %
Lymphs Abs: 2.2 10*3/uL (ref 0.7–4.0)
MCH: 33.3 pg (ref 26.0–34.0)
MCHC: 32.3 g/dL (ref 30.0–36.0)
MCV: 103.1 fL — ABNORMAL HIGH (ref 80.0–100.0)
Monocytes Absolute: 0.6 10*3/uL (ref 0.1–1.0)
Monocytes Relative: 7 %
Neutro Abs: 5.1 10*3/uL (ref 1.7–7.7)
Neutrophils Relative %: 62 %
Platelets: 252 10*3/uL (ref 150–400)
RBC: 3.81 MIL/uL — ABNORMAL LOW (ref 3.87–5.11)
RDW: 15.8 % — ABNORMAL HIGH (ref 11.5–15.5)
WBC: 8.3 10*3/uL (ref 4.0–10.5)
nRBC: 0 % (ref 0.0–0.2)

## 2021-03-26 LAB — COMPREHENSIVE METABOLIC PANEL
ALT: 11 U/L (ref 0–44)
AST: 17 U/L (ref 15–41)
Albumin: 3.9 g/dL (ref 3.5–5.0)
Alkaline Phosphatase: 43 U/L (ref 38–126)
Anion gap: 6 (ref 5–15)
BUN: 14 mg/dL (ref 8–23)
CO2: 28 mmol/L (ref 22–32)
Calcium: 8.9 mg/dL (ref 8.9–10.3)
Chloride: 105 mmol/L (ref 98–111)
Creatinine, Ser: 0.69 mg/dL (ref 0.44–1.00)
GFR, Estimated: >60 mL/min (ref >60)
Glucose, Bld: 92 mg/dL (ref 70–99)
Potassium: 4.5 mmol/L (ref 3.5–5.1)
Sodium: 139 mmol/L (ref 135–145)
Total Bilirubin: 0.6 mg/dL (ref 0.3–1.2)
Total Protein: 7.2 g/dL (ref 6.5–8.1)

## 2021-03-26 NOTE — Progress Notes (Signed)
Patient was assessed by Dr. Delton Coombes and labs have been reviewed. Hold Xgeva injection today per Dr. Delton Coombes. Primary RN and pharmacy aware.

## 2021-03-26 NOTE — Patient Instructions (Addendum)
[place] at [facility] Discharge Instructions  You were seen today by Dr. Delton Coombes. He went over your recent results and scans. Start taking Ibrance daily for 3 weeks on and 1 week off. Take Imodium only if you have watery bowel movement. Drink 1-2 cans of Ensure Clear or D[place] daily to improve your weight and energy levels. Dr. Delton Coombes will see you back in 2 months for labs and follow up.   Thank you for choosing [place] at [name] to provide your oncology and hematology care.  To afford each patient quality time with our provider, please arrive at least 15 minutes before your scheduled appointment time.   If you have a lab appointment with the [facility] please come in thru the Main Entrance and check in at the main information desk  You need to re-schedule your appointment should you arrive 10 or more minutes late.  We strive to give you quality time with our providers, and arriving late affects you and other patients whose appointments are after yours.  Also, if you no show three or more times for appointments you may be dismissed from the clinic at the providers discretion.     Again, thank you for choosing [facility].  Our hope is that these requests will decrease the amount of time that you wait before being seen by our physicians.       _____________________________________________________________  Should you have questions after your visit to [facility], please contact our office at [phone] between the hours of 8:00 a.m. and 4:30 p.m.  Voicemails left after 4:00 p.m. will not be returned until the following business day.  For prescription refill requests, have your pharmacy contact our office and allow 72 hours.    Cancer Center Support Programs:   > Cancer Support Group  2nd Tuesday of the month 1pm-2pm, Journey Room

## 2021-03-26 NOTE — Progress Notes (Signed)
 [facility] [address] [place], [place] [address]   Patient Care Team: Hall, John Z, MD as PCP - General (Internal Medicine) Kelly, Thomas A, MD as PCP - Cardiology (Cardiology) Rourk, Robert M, MD (Gastroenterology) Edwards, Morgan P, RN as Oncology Nurse Navigator (Oncology)  SUMMARY OF ONCOLOGIC HISTORY: Oncology History   No history exists.    CHIEF COMPLIANT: Follow-up for left breast cancer   INTERVAL HISTORY: [name] is a 87 y.o. female here today for follow up of her left breast cancer. Her last visit was on 02/27/2021.   Today she is accompaneid by her daughter and she reports feeling terrible. She complains of having fatigue and continuing pain in her right shoulder. She is taking Norco twice a day to sometimes none. Her diarrhea has improved, though she still has 3 soft BM's. She takes Imodium for watery BM. She has not started taking Ibrance. Her appetite is decreased due to abnormal taste and is eating less. She denies having any more gum or mouth sores.   REVIEW OF SYSTEMS:   Review of Systems  Constitutional: Positive for appetite change (50%), fatigue (depleted) and unexpected weight change (lost 5 lbs in 1 month).  HENT:   Positive for trouble swallowing (food & meds). Negative for mouth sores.   Respiratory: Positive for shortness of breath.   Cardiovascular: Positive for leg swelling (feet).  Gastrointestinal: Positive for diarrhea (3 soft BM's daily).  Genitourinary: Positive for hematuria (last few days).   Musculoskeletal: Positive for arthralgias (7/10 R shoulder pain) and gait problem (wobbly).  Neurological: Positive for dizziness, gait problem (wobbly) and numbness (fingertips).  Psychiatric/Behavioral: Positive for depression and sleep disturbance. The patient is nervous/anxious.   All other systems reviewed and are negative.   I have reviewed the past medical history, past surgical history, social history and family  history with the patient and they are unchanged from previous note.   ALLERGIES:   is allergic to contrast media [iodinated diagnostic agents], lipitor [atorvastatin], sulfamethoxazole, and sulfonamide derivatives.   MEDICATIONS:  Current Outpatient Medications  Medication Sig Dispense Refill  . HYDROcodone-acetaminophen (NORCO) 5-325 MG tablet Take 1 tablet by mouth every 4 (four) hours as needed for moderate pain. 180 tablet 0  . acetaminophen (TYLENOL) 325 MG tablet Take 2 tablets (650 mg total) by mouth every 6 (six) hours as needed for mild pain or headache (or temp > 37.5 C (99.5 F)). 40 tablet 0  . anastrozole (ARIMIDEX) 1 MG tablet TAKE 1 TABLET BY MOUTH EVERY DAY 90 tablet 2  . aspirin EC 81 MG EC tablet Take 2 tablets (162 mg total) by mouth daily. Swallow whole. 60 tablet 2  . carboxymethylcellulose (REFRESH PLUS) 0.5 % SOLN Place 3-4 drops into both eyes 3 (three) times daily as needed (dry eyes). Preservative free (Patient not taking: Reported on 02/27/2021)    . clopidogrel (PLAVIX) 75 MG tablet Take 75 mg by mouth daily.     . clorazepate (TRANXENE) 15 MG tablet Take 1 tablet (15 mg total) by mouth daily. 90 tablet 0  . escitalopram (LEXAPRO) 20 MG tablet TAKE 1 TABLET BY MOUTH EVERY DAY 90 tablet 2  . ezetimibe (ZETIA) 10 MG tablet TAKE 1 TABLET BY MOUTH EVERY DAY 90 tablet 0  . gabapentin (NEURONTIN) 100 MG capsule TAKE 1 CAPSULE BY MOUTH THREE TIMES A DAY 90 capsule 2  . latanoprost (XALATAN) 0.005 % ophthalmic solution Place 1 drop into both eyes at bedtime.    .   metoprolol tartrate (LOPRESSOR) 25 MG tablet TAKE 1/2 TABLET BY MOUTH TWICE A DAY 90 tablet 0  . nitroGLYCERIN (NITROSTAT) 0.4 MG SL tablet Place 1 tablet (0.4 mg total) under the tongue every 5 (five) minutes as needed. (Patient not taking: Reported on 02/27/2021) 25 tablet 3  . ondansetron (ZOFRAN) 8 MG tablet Take 1 tablet (8 mg total) by mouth every 8 (eight) hours as needed for nausea or vomiting. (Patient not  taking: Reported on 02/27/2021) 20 tablet 0  . palbociclib (IBRANCE) 75 MG tablet Take 1 tablet (75 mg total) by mouth daily. Take for 21 days on, 7 days off, repeat every 28 days. (Patient not taking: Reported on 03/26/2021) 21 tablet 3  . pantoprazole (PROTONIX) 40 MG tablet Take 1 tablet (40 mg total) by mouth daily before breakfast. 30 tablet 1   No current facility-administered medications for this visit.     PHYSICAL EXAMINATION: Performance status (ECOG): 1 - Symptomatic but completely ambulatory  Vitals:   03/26/21 1211  BP: 114/62  Pulse: 68  Resp: 18  Temp: (!) 96.9 F (36.1 C)  SpO2: 96%   Wt Readings from Last 3 Encounters:  03/26/21 153 lb 9.6 oz (69.7 kg)  02/27/21 158 lb 12.8 oz (72 kg)  01/30/21 156 lb 12.8 oz (71.1 kg)   Physical Exam Vitals reviewed.  Constitutional:      Appearance: Normal appearance.  HENT:     Mouth/Throat:     Lips: No lesions.     Mouth: No oral lesions.     Dentition: No gum lesions.     Tongue: No lesions.     Palate: No lesions.     Pharynx: No posterior oropharyngeal erythema.     Tonsils: No tonsillar exudate.  Cardiovascular:     Rate and Rhythm: Normal rate and regular rhythm.     Pulses: Normal pulses.     Heart sounds: Normal heart sounds.  Pulmonary:     Effort: Pulmonary effort is normal.     Breath sounds: Normal breath sounds.  Chest:  Breasts:     Right: No supraclavicular adenopathy.     Left: No supraclavicular adenopathy.    Musculoskeletal:     Right lower leg: No edema.     Left lower leg: No edema.  Lymphadenopathy:     Cervical: No cervical adenopathy.     Upper Body:     Right upper body: No supraclavicular adenopathy.     Left upper body: No supraclavicular adenopathy.  Neurological:     General: No focal deficit present.     Mental Status: She is alert and oriented to person, place, and time.  Psychiatric:        Mood and Affect: Mood normal.        Behavior: Behavior normal.     Breast  Exam Chaperone: Milinda Antis, MD     LABORATORY DATA:  I have reviewed the data as listed CMP Latest Ref Rng & Units 03/26/2021 02/27/2021 01/30/2021  Glucose 70 - 99 mg/dL 92 90 93  BUN 8 - 23 mg/dL _0 Creatinine 0.44 - 1.00 mg/dL 0.69 0.64 0.70  Sodium 135 - 145 mmol/L 139 140 139  Potassium 3.5 - 5.1 mmol/L 4.5 4.0 3.5  Chloride 98 - 111 mmol/L 105 106 105  CO2 22 - 32 mmol/L _1 Calcium 8.9 - 10.3 mg/dL 8.9 8.5(L) 8.5(L)  Total Protein 6.5 - 8.1 g/dL 7.2 6.9 7.3  Total Bilirubin 0.3 -  1.2 mg/dL 0.6 0.6 0.6  Alkaline Phos 38 - 126 U/L 43 37(L) 37(L)  AST 15 - 41 U/L _0 ALT 0 - 44 U/L _1 Lab Results  Component Value Date   CAN153 28.7 (H) 01/30/2021   CAN153 32.2 (H) 01/02/2021   CAN153 30.9 (H) 12/07/2020   Lab Results  Component Value Date   WBC 8.3 03/26/2021   HGB 12.7 03/26/2021   HCT 39.3 03/26/2021   MCV 103.1 (H) 03/26/2021   PLT 252 03/26/2021   NEUTROABS 5.1 03/26/2021    ASSESSMENT:  1. T2N0 left breast infiltrating lobular carcinoma: -Status post lumpectomy in June 2014, 1.4 cm, grade 1, lymphovascular invasion positive, ER 100%, PR 26%, Ki-67 18%. -She underwent XRT.She did not take adjuvant endocrine therapy. -Mammogram on 07/13/2020, BI-RADS Category 1. -CEA was 10.3, Ca1 2515.8 and CA 19-9 05. -PET scan on 08/07/2020 shows widespread hypermetabolic bone meta stasis with soft tissue components at T1 and sacrum. Thoracic nodal metastasis. -MRI of the thoracic spine on 08/15/2020 shows pathological fracture of T1 with mild osseous retropulsion and possible left C8 nerve root encroachment within the foramen at C7-T1. No cord deformity. -Right third rib biopsy on 08/22/2020 consistent with metastatic breast cancer, ER 100% positive, PR 40% positive, Ki-67 10%, HER-2 2+ and negative by FISH. -Ibrance 100 mg 3 weeks on 1 week off on anastrozole 1 mg daily started on 08/31/2020. -Ibrance held on 10/13/2020 secondary to severe  tiredness.  2. Social/family history: -She quit smoking 20 years ago, smoked 2 packs/day for more than 20 years prior to quitting. -She lives at home by herself and is independent of all activities. -Daughter who has multiple myeloma at age 22.   PLAN:  1.Metastatic cancer to the bones, highly likely breast cancer: -She is currently taking anastrozole without any major problems. - We have held her Ibrance secondary to diarrhea at last visit. - Reviewed PET scan from 02/19/2021 again showing excellent interval response to therapy. - Reviewed her labs today which showed near normal CBC and LFTs. - I have recommended her to start back on Ibrance 75 mg 3 weeks on 1 week off. - RTC 2 months for follow-up with repeat labs and tumor markers.  2. CAD and RCA stenting: -Continue metoprolol and Plavix.  3. Right shoulder pain: -She reported right shoulder pain which started when she overextended her right arm trying to reach TV remote. - We have reviewed her recent scans which did not show any evidence of malignancy in the right shoulder. - The pain started few days back.  If it does not improve, will consider MRI of the shoulder.  4. Bone metastasis: -She reports feeling some bumps in the upper jaw gum. - She will visit her son-in-law who is a Pharmacist, community in [place] and have x-rays taken. - We will plan to restart denosumab after dental evaluation.  5.Depression/anxiety: -Continue Lexapro 10 mg daily.  Continue clorazepate 15 mg at bedtime as needed.  6. Diarrhea: -We have reviewed stool panel which was negative. - Diarrhea has improved at this time.  She has up to 3 stools per day, mostly well formed and occasionally watery. - She was instructed to take Imodium if she has any watery diarrhea.  Breast Cancer therapy associated bone loss: I have recommended calcium, Vitamin D and weight bearing exercises.  Orders placed this encounter:  No orders of the defined types were  placed in this encounter.   The patient has a  good understanding of the overall plan. She agrees with it. She will call with any problems that may develop before the next visit here.   , MD [facility] [phone]   I, Daniel Khashchuk, am acting as a scribe for Dr.  Katagadda.  I,   MD, have reviewed the above documentation for accuracy and completeness, and I agree with the above.     

## 2021-03-26 NOTE — Progress Notes (Signed)
No Xgeva administered today per Dr. Delton Coombes.  Pt and family aware.

## 2021-03-26 NOTE — Progress Notes (Signed)
Pt is taking Anastrozole as prescribed and is still having hot flashes.

## 2021-03-26 NOTE — Patient Instructions (Signed)
[facility]  Discharge Instructions: Thank you for choosing [facility] to provide your oncology and hematology care.  If you have a lab appointment with the [facility], please come in thru the Main Entrance and check in at the main information desk.  Wear comfortable clothing and clothing appropriate for easy access to any Portacath or PICC line.   We strive to give you quality time with your provider. You may need to reschedule your appointment if you arrive late (15 or more minutes).  Arriving late affects you and other patients whose appointments are after yours.  Also, if you miss three or more appointments without notifying the office, you may be dismissed from the clinic at the provider's discretion.      For prescription refill requests, have your pharmacy contact our office and allow 72 hours for refills to be completed.    Today you did not receive Xgeva per Dr. Tomie China order.   To help prevent nausea and vomiting after your treatment, we encourage you to take your nausea medication as directed.  BELOW ARE SYMPTOMS THAT SHOULD BE REPORTED IMMEDIATELY: . *FEVER GREATER THAN 100.4 F (38 C) OR HIGHER . *CHILLS OR SWEATING . *NAUSEA AND VOMITING THAT IS NOT CONTROLLED WITH YOUR NAUSEA MEDICATION . *UNUSUAL SHORTNESS OF BREATH . *UNUSUAL BRUISING OR BLEEDING . *URINARY PROBLEMS (pain or burning when urinating, or frequent urination) . *BOWEL PROBLEMS (unusual diarrhea, constipation, pain near the anus) . TENDERNESS IN MOUTH AND THROAT WITH OR WITHOUT PRESENCE OF ULCERS (sore throat, sores in mouth, or a toothache) . UNUSUAL RASH, SWELLING OR PAIN  . UNUSUAL VAGINAL DISCHARGE OR ITCHING   Items with * indicate a potential emergency and should be followed up as soon as possible or go to the Emergency Department if any problems should occur.  Please show the CHEMOTHERAPY ALERT CARD or IMMUNOTHERAPY ALERT CARD at check-in to the Emergency Department  and triage nurse.  Should you have questions after your visit or need to cancel or reschedule your appointment, please contact [facility] [phone]  and follow the prompts.  Office hours are 8:00 a.m. to 4:30 p.m. Monday - Friday. Please note that voicemails left after 4:00 p.m. may not be returned until the following business day.  We are closed weekends and major holidays. You have access to a nurse at all times for urgent questions. Please call the main number to the clinic [phone] and follow the prompts.  For any non-urgent questions, you may also contact your provider using MyChart. We now offer e-Visits for anyone 29 and older to request care online for non-urgent symptoms. For details visit [email or web address].   Also download the MyChart app! Go to the app store, search "MyChart", open the app, select [facility], and log in with your MyChart username and password.  Due to Covid, a mask is required upon entering the hospital/clinic. If you do not have a mask, one will be given to you upon arrival. For doctor visits, patients may have 1 support person aged 86 or older with them. For treatment visits, patients cannot have anyone with them due to current Covid guidelines and our immunocompromised population.

## 2021-03-27 ENCOUNTER — Ambulatory Visit (HOSPITAL_COMMUNITY): Payer: Medicare Other | Admitting: Hematology

## 2021-03-27 ENCOUNTER — Ambulatory Visit (HOSPITAL_COMMUNITY): Payer: Medicare Other

## 2021-03-27 ENCOUNTER — Other Ambulatory Visit (HOSPITAL_COMMUNITY): Payer: Medicare Other

## 2021-03-27 LAB — CANCER ANTIGEN 15-3: CA 15-3: 29.5 U/mL — ABNORMAL HIGH (ref 0.0–25.0)

## 2021-03-27 LAB — CANCER ANTIGEN 27.29: CA 27.29: 35.9 U/mL (ref 0.0–38.6)

## 2021-03-28 DIAGNOSIS — C50212 Malignant neoplasm of upper-inner quadrant of left female breast: Secondary | ICD-10-CM | POA: Diagnosis not present

## 2021-03-28 DIAGNOSIS — F411 Generalized anxiety disorder: Secondary | ICD-10-CM | POA: Diagnosis not present

## 2021-03-28 DIAGNOSIS — Z760 Encounter for issue of repeat prescription: Secondary | ICD-10-CM | POA: Diagnosis not present

## 2021-03-29 ENCOUNTER — Other Ambulatory Visit (HOSPITAL_COMMUNITY): Payer: Self-pay

## 2021-03-30 MED ORDER — HYDROCODONE-ACETAMINOPHEN 5-325 MG PO TABS: 1.0000 | ORAL_TABLET | ORAL | 0 refills | Status: DC | PRN

## 2021-04-10 ENCOUNTER — Ambulatory Visit: Payer: Medicare Other | Admitting: Cardiovascular Disease

## 2021-04-25 ENCOUNTER — Other Ambulatory Visit (HOSPITAL_COMMUNITY): Payer: Self-pay

## 2021-04-25 MED ORDER — PALBOCICLIB 75 MG PO TABS: 75.0000 mg | ORAL_TABLET | Freq: Every day | ORAL | 3 refills | Status: DC

## 2021-04-25 NOTE — Telephone Encounter (Signed)
Chart reviewed. Ibrance refilled per Dr. Delton Coombes

## 2021-05-06 ENCOUNTER — Other Ambulatory Visit: Payer: Self-pay | Admitting: Cardiovascular Disease

## 2021-05-11 ENCOUNTER — Ambulatory Visit: Payer: Medicare Other | Admitting: Physician Assistant

## 2021-05-17 DIAGNOSIS — M1990 Unspecified osteoarthritis, unspecified site: Secondary | ICD-10-CM | POA: Insufficient documentation

## 2021-05-17 DIAGNOSIS — K219 Gastro-esophageal reflux disease without esophagitis: Secondary | ICD-10-CM | POA: Insufficient documentation

## 2021-05-17 DIAGNOSIS — E782 Mixed hyperlipidemia: Secondary | ICD-10-CM | POA: Insufficient documentation

## 2021-05-21 ENCOUNTER — Inpatient Hospital Stay (HOSPITAL_COMMUNITY): Payer: Medicare Other | Attending: Hematology

## 2021-05-21 ENCOUNTER — Ambulatory Visit (HOSPITAL_COMMUNITY): Payer: Medicare Other | Admitting: Hematology

## 2021-05-21 ENCOUNTER — Inpatient Hospital Stay (HOSPITAL_COMMUNITY): Payer: Medicare Other

## 2021-05-21 ENCOUNTER — Other Ambulatory Visit: Payer: Self-pay

## 2021-05-21 VITALS — BP 152/70 | HR 66 | Temp 97.0°F | Resp 20 | Wt 155.0 lb

## 2021-05-21 DIAGNOSIS — C50912 Malignant neoplasm of unspecified site of left female breast: Secondary | ICD-10-CM | POA: Diagnosis not present

## 2021-05-21 DIAGNOSIS — R197 Diarrhea, unspecified: Secondary | ICD-10-CM | POA: Insufficient documentation

## 2021-05-21 DIAGNOSIS — Z853 Personal history of malignant neoplasm of breast: Secondary | ICD-10-CM

## 2021-05-21 DIAGNOSIS — Z170 Estrogen receptor positive status [ER+]: Secondary | ICD-10-CM | POA: Diagnosis not present

## 2021-05-21 DIAGNOSIS — F32A Depression, unspecified: Secondary | ICD-10-CM | POA: Insufficient documentation

## 2021-05-21 DIAGNOSIS — F419 Anxiety disorder, unspecified: Secondary | ICD-10-CM | POA: Insufficient documentation

## 2021-05-21 DIAGNOSIS — M25511 Pain in right shoulder: Secondary | ICD-10-CM | POA: Insufficient documentation

## 2021-05-21 DIAGNOSIS — C7951 Secondary malignant neoplasm of bone: Secondary | ICD-10-CM | POA: Insufficient documentation

## 2021-05-21 LAB — COMPREHENSIVE METABOLIC PANEL
ALT: 11 U/L (ref 0–44)
AST: 18 U/L (ref 15–41)
Albumin: 4 g/dL (ref 3.5–5.0)
Alkaline Phosphatase: 43 U/L (ref 38–126)
Anion gap: 7 (ref 5–15)
BUN: 13 mg/dL (ref 8–23)
CO2: 27 mmol/L (ref 22–32)
Calcium: 8.9 mg/dL (ref 8.9–10.3)
Chloride: 104 mmol/L (ref 98–111)
Creatinine, Ser: 0.79 mg/dL (ref 0.44–1.00)
GFR, Estimated: >60 mL/min (ref >60)
Glucose, Bld: 95 mg/dL (ref 70–99)
Potassium: 4.7 mmol/L (ref 3.5–5.1)
Sodium: 138 mmol/L (ref 135–145)
Total Bilirubin: 0.7 mg/dL (ref 0.3–1.2)
Total Protein: 7.1 g/dL (ref 6.5–8.1)

## 2021-05-21 LAB — CBC WITH DIFFERENTIAL/PLATELET
Abs Immature Granulocytes: 0.01 10*3/uL (ref 0.00–0.07)
Basophils Absolute: 0 10*3/uL (ref 0.0–0.1)
Basophils Relative: 1 %
Blasts: 2 %
Eosinophils Absolute: 0.1 10*3/uL (ref 0.0–0.5)
Eosinophils Relative: 2 %
HCT: 37.5 % (ref 36.0–46.0)
Hemoglobin: 12.1 g/dL (ref 12.0–15.0)
Lymphocytes Relative: 42 %
Lymphs Abs: 1.7 10*3/uL (ref 0.7–4.0)
MCH: 34.7 pg — ABNORMAL HIGH (ref 26.0–34.0)
MCHC: 32.3 g/dL (ref 30.0–36.0)
MCV: 107.4 fL — ABNORMAL HIGH (ref 80.0–100.0)
Monocytes Absolute: 0.3 10*3/uL (ref 0.1–1.0)
Monocytes Relative: 7 %
Neutro Abs: 1.8 10*3/uL (ref 1.7–7.7)
Neutrophils Relative %: 46 %
Platelets: 167 10*3/uL (ref 150–400)
RBC: 3.49 MIL/uL — ABNORMAL LOW (ref 3.87–5.11)
RDW: 15.1 % (ref 11.5–15.5)
WBC: 4 10*3/uL (ref 4.0–10.5)
nRBC: 0 % (ref 0.0–0.2)

## 2021-05-21 LAB — MAGNESIUM: Magnesium: 2 mg/dL (ref 1.7–2.4)

## 2021-05-21 MED ORDER — DENOSUMAB 120 MG/1.7ML ~~LOC~~ SOLN
SUBCUTANEOUS | Status: AC
  Filled 2021-05-21: qty 1.7

## 2021-05-21 MED ORDER — DENOSUMAB 120 MG/1.7ML ~~LOC~~ SOLN: 120.0000 mg | Freq: Once | SUBCUTANEOUS | Status: DC

## 2021-05-21 NOTE — Patient Instructions (Signed)
[facility]  Discharge Instructions: Thank you for choosing [facility] to provide your oncology and hematology care.  If you have a lab appointment with the Cancer Center, please come in thru the Main Entrance and check in at the main information desk.  Wear comfortable clothing and clothing appropriate for easy access to any Portacath or PICC line.   We strive to give you quality time with your provider. You may need to reschedule your appointment if you arrive late (15 or more minutes).  Arriving late affects you and other patients whose appointments are after yours.  Also, if you miss three or more appointments without notifying the office, you may be dismissed from the clinic at the provider's discretion.      For prescription refill requests, have your pharmacy contact our office and allow 72 hours for refills to be completed.        To help prevent nausea and vomiting after your treatment, we encourage you to take your nausea medication as directed.  BELOW ARE SYMPTOMS THAT SHOULD BE REPORTED IMMEDIATELY: *FEVER GREATER THAN 100.4 F (38 C) OR HIGHER *CHILLS OR SWEATING *NAUSEA AND VOMITING THAT IS NOT CONTROLLED WITH YOUR NAUSEA MEDICATION *UNUSUAL SHORTNESS OF BREATH *UNUSUAL BRUISING OR BLEEDING *URINARY PROBLEMS (pain or burning when urinating, or frequent urination) *BOWEL PROBLEMS (unusual diarrhea, constipation, pain near the anus) TENDERNESS IN MOUTH AND THROAT WITH OR WITHOUT PRESENCE OF ULCERS (sore throat, sores in mouth, or a toothache) UNUSUAL RASH, SWELLING OR PAIN  UNUSUAL VAGINAL DISCHARGE OR ITCHING   Items with * indicate a potential emergency and should be followed up as soon as possible or go to the Emergency Department if any problems should occur.  Please show the CHEMOTHERAPY ALERT CARD or IMMUNOTHERAPY ALERT CARD at check-in to the Emergency Department and triage nurse.  Should you have questions after your visit or need to cancel  or reschedule your appointment, please contact [facility] [phone]  and follow the prompts.  Office hours are 8:00 a.m. to 4:30 p.m. Monday - Friday. Please note that voicemails left after 4:00 p.m. may not be returned until the following business day.  We are closed weekends and major holidays. You have access to a nurse at all times for urgent questions. Please call the main number to the clinic [phone] and follow the prompts.  For any non-urgent questions, you may also contact your provider using MyChart. We now offer e-Visits for anyone 18 and older to request care online for non-urgent symptoms. For details visit [email or web address].   Also download the MyChart app! Go to the app store, search "MyChart", open the app, select [facility], and log in with your MyChart username and password.  Due to Covid, a mask is required upon entering the hospital/clinic. If you do not have a mask, one will be given to you upon arrival. For doctor visits, patients may have 1 support person aged 18 or older with them. For treatment visits, patients cannot have anyone with them due to current Covid guidelines and our immunocompromised population.  

## 2021-05-21 NOTE — Progress Notes (Signed)
Patient presents today for Xgeva injection per MD order.  Patients Calcium is 8.9, is not having any jaw pain or major dental work.    Patient refusing injection today stating that she has a sore on her gum and that she didn't think she should have the injection.  Explained to patient that Delton See normally causes jaw pain and that it shouldn't be given before or close after dental work.  Asked the patient if she had been evaluated by a dentist.  Patient states that she has not seen a dentist and prefers not to get it today.    No complaints at this time.  Discharge from clinic ambulatory in stable condition.  Alert and oriented X 3.  Follow up with [facility] as scheduled.

## 2021-05-22 LAB — CANCER ANTIGEN 27.29: CA 27.29: 30.6 U/mL (ref 0.0–38.6)

## 2021-05-22 LAB — CANCER ANTIGEN 15-3: CA 15-3: 26.7 U/mL — ABNORMAL HIGH (ref 0.0–25.0)

## 2021-06-16 NOTE — Progress Notes (Signed)
[address], [place] [address]   CLINIC:  Medical Oncology/Hematology  PCP:  Celene Squibb, MD [address] [place] [address] [phone]   REASON FOR VISIT:  Follow-up for left breast cancer  PRIOR THERAPY: none  NGS Results: not done  CURRENT THERAPY: XGEVA Q28D & IV fluids & antiemetics  CANCER STAGING: Cancer Staging History of breast cancer Staging form: Breast, AJCC 7th Edition - Clinical stage from 04/20/2013: Stage IIA (T2, N0, cM0) - Unsigned - Pathologic: No stage assigned - Unsigned   INTERVAL HISTORY:  [name], a 87 y.o. female, returns for routine follow-up of her left breast cancer. Kara Woods was last seen on 03/26/21.   Today she reports feeling well, and she accompanied by her daughter. She reports pain and limited ROM in her right arm for the past 3 months. She denies n/v/d and jaw pain. She reports occasional pain in her ribs on her left side as well as lower back pain worsened with movement. She also reports reduced appetite.   REVIEW OF SYSTEMS:  Review of Systems  Constitutional:  Positive for appetite change (70%) and fatigue (70%).  Respiratory:  Positive for shortness of breath.   Gastrointestinal:  Positive for constipation. Negative for diarrhea, nausea and vomiting.  Genitourinary:  Positive for nocturia.   Musculoskeletal:  Positive for arthralgias (occasional pain in her ribs), back pain (5/10) and myalgias (R arm).  Neurological:  Positive for dizziness (positional) and numbness (toes).  All other systems reviewed and are negative.  PAST MEDICAL/SURGICAL HISTORY:  Past Medical History:  Diagnosis Date   Anxiety    Arthritis    Blind left eye    Breast cancer ([place])    left    Colitis    Coronary artery disease    a. 10/2016 Staged PCI: RCA 50p, 49m(2.25x12 Resolute Onyx DES), LCX 50p, 861m2.75x23 Xience Alpine DES). Residual LAD 50p/m, 4537m   Cystocele 10/11/2013    Depression    Diastolic dysfunction    a. 09/2016 Echo: EF 50%, diffuse hypokinesis, mild LVH, grade 1 diastolic dysfunction, mild mitral regurgitation, mildly dilated left atrium.   GERD (gastroesophageal reflux disease)    occasional Tums only   HOH (hard of hearing)    Pelvic relaxation 10/11/2013   Proctitis    colonsocopy 2007, Canasa suppositories   S/P endoscopy March 2009   mild erosive reflux esophagitis, Schatzki's ring, s/p dilation   Schatzki's ring    Shortness of breath    occ if anxiety   Wears dentures    upper   Wears glasses    Past Surgical History:  Procedure Laterality Date   ABDOMINAL HYSTERECTOMY     ANTERIOR AND POSTERIOR REPAIR N/A 05/17/2014   Procedure: ANTERIOR (CYSTOCELE) AND POSTERIOR REPAIR (RECTOCELE);  Surgeon: ScoReece PackerD;  [facility];  Service: Urology;  Laterality: N/A;   APPENDECTOMY     BREAST LUMPECTOMY WITH NEEDLE LOCALIZATION AND AXILLARY SENTINEL LYMPH NODE BX Left 05/03/2013   Procedure: BREAST LUMPECTOMY WITH NEEDLE LOCALIZATION AND AXILLARY SENTINEL LYMPH NODE BX;  Surgeon: BenEdward JollyD;  [facility];  Laterality: Left;   BREAST SURGERY Left 05/19/13   CARDIAC CATHETERIZATION  1998   CARDIAC CATHETERIZATION N/A 11/04/2016   Procedure: Left Heart Cath and Coronary Angiography;  Surgeon: ThoTroy SineD;  Location: [facility];  Service: Cardiovascular;  Laterality: N/A;  CARDIAC CATHETERIZATION N/A 11/05/2016   Procedure: Coronary Stent Intervention;  Surgeon: Troy Sine, MD;  Location: [facility];  Service: Cardiovascular;  Laterality: N/A;   CHOLECYSTECTOMY     COLONOSCOPY  05/06/2006   Diffuse inflammatory changes of the rectal mucosa, consistent  with proctitis.  Otherwise, normal colon to terminal ileum   CORONARY ANGIOPLASTY  11/04/2016   CORONARY STENT PLACEMENT     Drug-eluting coronary artery stent, non-bioabsorbable-polymer-coated    CYSTOSCOPY N/A 05/17/2014   Procedure: CYSTOSCOPY;  Surgeon: Reece Packer, MD;  Location: [facility];  Service: Urology;  Laterality: N/A;   ESOPHAGOGASTRODUODENOSCOPY  02/09/2008   Schatzki ring status post dilation/Distal esophageal erosion consistent with mild erosive reflux  esophagitis, otherwise unremarkable esophagus, normal stomach, D1, D2.   EYE SURGERY     lt cataract-implant   EYE SURGERY     lt-lens repaced,l   LUMBAR DISC SURGERY  1993   RE-EXCISION OF BREAST LUMPECTOMY Left 05/19/2013   Procedure: RE-EXCISION OF BREAST LUMPECTOMY;  Surgeon: Edward Jolly, MD;  Location: [facility];  Service: General;  Laterality: Left;   RETINAL DETACHMENT SURGERY  1978   Left   VAGINAL HYSTERECTOMY Bilateral 05/17/2014   Procedure: HYSTERECTOMY VAGINAL with Bilateral Salpingo-Oophorectomy; Bladder cystotomy repair;  Surgeon: Marvene Staff, MD;  Location: [facility];  Service: Gynecology;  Laterality: Bilateral;   VAGINAL PROLAPSE REPAIR N/A 05/17/2014   Procedure: VAGINAL VAULT PROLAPSE AND GRAFT;  Surgeon: Reece Packer, MD;  Location: [facility];  Service: Urology;  Laterality: N/A;    SOCIAL HISTORY:  Social History   Socioeconomic History   Marital status: Widowed    Spouse name: Not on file   Number of children: 6   Years of education: Not on file   Highest education level: Not on file  Occupational History   Occupation: Retired  Tobacco Use   Smoking status: Former    Packs/day: 1.00    Types: Cigarettes    Quit date: 04/28/1990    Years since quitting: 31.1   Smokeless tobacco: Never   Tobacco comments:    quit 28 yrs ago  Substance and Sexual Activity   Alcohol use: Yes    Alcohol/week: 1.0 standard drink    Types: 1 Glasses of wine per week    Comment: occ   Drug use: No   Sexual activity: Not Currently    Birth control/protection: Post-menopausal  Other Topics Concern   Not on file  Social History Narrative   Not on file   Social  Determinants of Health   Financial Resource Strain: Low Risk    Difficulty of Paying Living Expenses: Not hard at all  Food Insecurity: No Food Insecurity   Worried About Charity fundraiser in the Last Year: Never true   Ran Out of Food in the Last Year: Never true  Transportation Needs: No Transportation Needs   Lack of Transportation (Medical): No   Lack of Transportation (Non-Medical): No  Physical Activity: Insufficiently Active   Days of Exercise per Week: 2 days   Minutes of Exercise per Session: 10 min  Stress: No Stress Concern Present   Feeling of Stress : Only a little  Social Connections: Socially Isolated   Frequency of Communication with Friends and Family: More than three times a week   Frequency of Social Gatherings with Friends and Family: More than three times a week   Attends Religious Services: Never   Marine scientist or Organizations:  No   Attends Archivist Meetings: Never   Marital Status: Widowed  Human resources officer Violence: Not At Risk   Fear of Current or Ex-Partner: No   Emotionally Abused: No   Physically Abused: No   Sexually Abused: No    FAMILY HISTORY:  Family History  Problem Relation Age of Onset   Cancer Brother        spinal   Heart attack Mother    Heart attack Father    Heart attack Son    Heart attack Son    Depression Son    Multiple myeloma Daughter    Anemia Daughter    Colon cancer Neg Hx     CURRENT MEDICATIONS:  Current Outpatient Medications  Medication Sig Dispense Refill   acetaminophen (TYLENOL) 325 MG tablet Take 2 tablets (650 mg total) by mouth every 6 (six) hours as needed for mild pain or headache (or temp > 37.5 C (99.5 F)). 40 tablet 0   anastrozole (ARIMIDEX) 1 MG tablet TAKE 1 TABLET BY MOUTH EVERY DAY 90 tablet 2   aspirin EC 81 MG EC tablet Take 2 tablets (162 mg total) by mouth daily. Swallow whole. 60 tablet 2   carboxymethylcellulose (REFRESH PLUS) 0.5 % SOLN Place 3-4 drops into both eyes  3 (three) times daily as needed (dry eyes). Preservative free     clopidogrel (PLAVIX) 75 MG tablet Take 75 mg by mouth daily.      clorazepate (TRANXENE) 15 MG tablet Take 1 tablet (15 mg total) by mouth daily. 90 tablet 0   escitalopram (LEXAPRO) 20 MG tablet TAKE 1 TABLET BY MOUTH EVERY DAY 90 tablet 2   ezetimibe (ZETIA) 10 MG tablet TAKE 1 TABLET BY MOUTH EVERY DAY 90 tablet 0   gabapentin (NEURONTIN) 100 MG capsule TAKE 1 CAPSULE BY MOUTH THREE TIMES A DAY 90 capsule 2   HYDROcodone-acetaminophen (NORCO) 5-325 MG tablet Take 1 tablet by mouth every 4 (four) hours as needed for moderate pain. 180 tablet 0   latanoprost (XALATAN) 0.005 % ophthalmic solution Place 1 drop into both eyes at bedtime.     metoprolol tartrate (LOPRESSOR) 25 MG tablet TAKE 1/2 TABLET BY MOUTH TWICE A DAY 90 tablet 0   nitroGLYCERIN (NITROSTAT) 0.4 MG SL tablet Place 1 tablet (0.4 mg total) under the tongue every 5 (five) minutes as needed. 25 tablet 3   ondansetron (ZOFRAN) 8 MG tablet Take 1 tablet (8 mg total) by mouth every 8 (eight) hours as needed for nausea or vomiting. 20 tablet 0   palbociclib (IBRANCE) 75 MG tablet Take 1 tablet (75 mg total) by mouth daily. Take for 21 days on, 7 days off, repeat every 28 days. 21 tablet 3   pantoprazole (PROTONIX) 40 MG tablet Take 1 tablet (40 mg total) by mouth daily before breakfast. 30 tablet 1   No current facility-administered medications for this visit.    ALLERGIES:  Allergies  Allergen Reactions   Contrast Media [Iodinated Diagnostic Agents] Anaphylaxis    Adverse reaction; pt hyperventilating, c/o CP and SOB    Lipitor [Atorvastatin] Other (See Comments)    Muscle aches, cramps, fatigue, weight loss/poor appetite   Sulfamethoxazole Other (See Comments)    Reaction was years ago unknown   Sulfonamide Derivatives Other (See Comments)    Dizziness     PHYSICAL EXAM:  Performance status (ECOG): 1 - Symptomatic but completely ambulatory  There were no  vitals filed for this visit. Wt Readings from Last 3 Encounters:  05/21/21 155 lb (70.3 kg)  03/26/21 153 lb 9.6 oz (69.7 kg)  02/27/21 158 lb 12.8 oz (72 kg)   Physical Exam Vitals reviewed.  Constitutional:      Appearance: Normal appearance.  Cardiovascular:     Rate and Rhythm: Normal rate and regular rhythm.     Pulses: Normal pulses.     Heart sounds: Normal heart sounds.  Pulmonary:     Effort: Pulmonary effort is normal.     Breath sounds: Normal breath sounds.  Neurological:     General: No focal deficit present.     Mental Status: She is alert and oriented to person, place, and time.  Psychiatric:        Mood and Affect: Mood normal.        Behavior: Behavior normal.     LABORATORY DATA:  I have reviewed the labs as listed.  CBC Latest Ref Rng & Units 05/21/2021 03/26/2021 02/27/2021  WBC 4.0 - 10.5 K/uL 4.0 8.3 3.2(L)  Hemoglobin 12.0 - 15.0 g/dL 12.1 12.7 11.5(L)  Hematocrit 36.0 - 46.0 % 37.5 39.3 35.3(L)  Platelets 150 - 400 K/uL 167 252 151   CMP Latest Ref Rng & Units 05/21/2021 03/26/2021 02/27/2021  Glucose 70 - 99 mg/dL 95 92 90  BUN 8 - 23 mg/dL 13 14 10   Creatinine 0.44 - 1.00 mg/dL 0.79 0.69 0.64  Sodium 135 - 145 mmol/L 138 139 140  Potassium 3.5 - 5.1 mmol/L 4.7 4.5 4.0  Chloride 98 - 111 mmol/L 104 105 106  CO2 22 - 32 mmol/L 27 28 24   Calcium 8.9 - 10.3 mg/dL 8.9 8.9 8.5(L)  Total Protein 6.5 - 8.1 g/dL 7.1 7.2 6.9  Total Bilirubin 0.3 - 1.2 mg/dL 0.7 0.6 0.6  Alkaline Phos 38 - 126 U/L 43 43 37(L)  AST 15 - 41 U/L 18 17 16   ALT 0 - 44 U/L 11 11 9     DIAGNOSTIC IMAGING:  I have independently reviewed the scans and discussed with the patient. No results found.   ASSESSMENT:  1.  T2N0 left breast infiltrating lobular carcinoma: -Status post lumpectomy in June 2014, 1.4 cm, grade 1, lymphovascular invasion positive, ER 100%, PR 26%, Ki-67 18%. -She underwent XRT.  She did not take adjuvant endocrine therapy. -Mammogram on 07/13/2020, BI-RADS  Category 1. -CEA was 10.3, Ca1 2515.8 and CA 19-9 05. -PET scan on 08/07/2020 shows widespread hypermetabolic bone meta stasis with soft tissue components at T1 and sacrum.  Thoracic nodal metastasis. -MRI of the thoracic spine on 08/15/2020 shows pathological fracture of T1 with mild osseous retropulsion and possible left C8 nerve root encroachment within the foramen at C7-T1.  No cord deformity. -Right third rib biopsy on 08/22/2020 consistent with metastatic breast cancer, ER 100% positive, PR 40% positive, Ki-67 10%, HER-2 2+ and negative by FISH. -Ibrance 100 mg 3 weeks on 1 week off on anastrozole 1 mg daily started on 08/31/2020. -Ibrance held on 10/13/2020 secondary to severe tiredness. - PET scan on 02/19/2021 shows improvement in metastatic disease.   2.  Social/family history: -She quit smoking 20 years ago, smoked 2 packs/day for more than 20 years prior to quitting. -She lives at home by herself and is independent of all activities. -Daughter who has multiple myeloma at age 1.   PLAN:  1.  Metastatic cancer to the bones, highly likely breast cancer: -Continue anastrozole daily.  Today we have reviewed her PET scan again and talked about prognosis of metastatic breast cancer. -  She is taking Ibrance 75 mg 3 weeks on 1 week off.  Today was her last day.  She will be off for the next week.  We reviewed labs from 06/18/2021 which showed normal LFTs and creatinine.  CBC shows white count 3.9 and ANC normal.  CA 15-3 and CA 27-29 were downtrending on 05/21/2021. - She will start back Ibrance next week.  She reported low back pain which is likely from Meta stasis.  Continue Tylenol and hydrocodone if the pain is severe.  She also has pain in the ribs at times and it is not severe. - I plan to repeat PET scan in 2 months along with tumor markers.   2.  CAD and RCA stenting: -She will continue metoprolol and Plavix.   3.  Right shoulder pain: -She is continuing to have some right shoulder  pain.  Range of mobility also decreased.  Differential includes rotator cuff tendinitis.  She will let us know if we need to proceed with MRI.   4.  Bone metastasis: -Last dose of Xgeva on 02/11/2021.  She reportedly developed some bumps on the upper jaw gum.  She was not evaluated by dentist yet. - We will hold her Delton See until she gets dental clearance. - Continue calcium and vitamin D supplements.   5.  Depression/anxiety: -Continue Lexapro 10 mg daily.  This is fairly well controlled.   6.  Diarrhea: -She has occasional diarrhea from [place].  Continue Imodium as needed.   Orders placed this encounter:  No orders of the defined types were placed in this encounter.    Derek Jack, MD [facility] [phone]   I, Thana Ates, am acting as a scribe for Dr. Derek Jack.  I, Derek Jack MD, have reviewed the above documentation for accuracy and completeness, and I agree with the above.

## 2021-06-18 ENCOUNTER — Other Ambulatory Visit (HOSPITAL_COMMUNITY): Payer: Medicare Other

## 2021-06-18 ENCOUNTER — Inpatient Hospital Stay (HOSPITAL_BASED_OUTPATIENT_CLINIC_OR_DEPARTMENT_OTHER): Payer: Medicare Other | Admitting: Hematology

## 2021-06-18 ENCOUNTER — Other Ambulatory Visit: Payer: Self-pay

## 2021-06-18 ENCOUNTER — Ambulatory Visit (HOSPITAL_COMMUNITY): Payer: Medicare Other

## 2021-06-18 ENCOUNTER — Inpatient Hospital Stay (HOSPITAL_COMMUNITY): Payer: Medicare Other | Attending: Hematology

## 2021-06-18 ENCOUNTER — Inpatient Hospital Stay (HOSPITAL_COMMUNITY): Payer: Medicare Other

## 2021-06-18 ENCOUNTER — Ambulatory Visit (HOSPITAL_COMMUNITY): Payer: Medicare Other | Admitting: Hematology

## 2021-06-18 VITALS — BP 99/60 | HR 80 | Temp 97.1°F | Resp 18 | Wt 153.7 lb

## 2021-06-18 DIAGNOSIS — C50912 Malignant neoplasm of unspecified site of left female breast: Secondary | ICD-10-CM | POA: Insufficient documentation

## 2021-06-18 DIAGNOSIS — H5462 Unqualified visual loss, left eye, normal vision right eye: Secondary | ICD-10-CM | POA: Insufficient documentation

## 2021-06-18 DIAGNOSIS — C50212 Malignant neoplasm of upper-inner quadrant of left female breast: Secondary | ICD-10-CM | POA: Diagnosis not present

## 2021-06-18 DIAGNOSIS — Z807 Family history of other malignant neoplasms of lymphoid, hematopoietic and related tissues: Secondary | ICD-10-CM | POA: Insufficient documentation

## 2021-06-18 DIAGNOSIS — M545 Low back pain, unspecified: Secondary | ICD-10-CM | POA: Diagnosis not present

## 2021-06-18 DIAGNOSIS — M79601 Pain in right arm: Secondary | ICD-10-CM | POA: Diagnosis not present

## 2021-06-18 DIAGNOSIS — Z780 Asymptomatic menopausal state: Secondary | ICD-10-CM | POA: Insufficient documentation

## 2021-06-18 DIAGNOSIS — Z170 Estrogen receptor positive status [ER+]: Secondary | ICD-10-CM

## 2021-06-18 DIAGNOSIS — R197 Diarrhea, unspecified: Secondary | ICD-10-CM | POA: Insufficient documentation

## 2021-06-18 DIAGNOSIS — C50919 Malignant neoplasm of unspecified site of unspecified female breast: Secondary | ICD-10-CM

## 2021-06-18 DIAGNOSIS — Z7902 Long term (current) use of antithrombotics/antiplatelets: Secondary | ICD-10-CM | POA: Diagnosis not present

## 2021-06-18 DIAGNOSIS — Z90710 Acquired absence of both cervix and uterus: Secondary | ICD-10-CM | POA: Insufficient documentation

## 2021-06-18 DIAGNOSIS — C7951 Secondary malignant neoplasm of bone: Secondary | ICD-10-CM | POA: Diagnosis not present

## 2021-06-18 DIAGNOSIS — F419 Anxiety disorder, unspecified: Secondary | ICD-10-CM | POA: Diagnosis not present

## 2021-06-18 DIAGNOSIS — Z79899 Other long term (current) drug therapy: Secondary | ICD-10-CM | POA: Diagnosis not present

## 2021-06-18 DIAGNOSIS — I2510 Atherosclerotic heart disease of native coronary artery without angina pectoris: Secondary | ICD-10-CM | POA: Diagnosis not present

## 2021-06-18 DIAGNOSIS — Z7289 Other problems related to lifestyle: Secondary | ICD-10-CM | POA: Insufficient documentation

## 2021-06-18 DIAGNOSIS — M25511 Pain in right shoulder: Secondary | ICD-10-CM | POA: Diagnosis not present

## 2021-06-18 DIAGNOSIS — F32A Depression, unspecified: Secondary | ICD-10-CM | POA: Insufficient documentation

## 2021-06-18 DIAGNOSIS — Z79811 Long term (current) use of aromatase inhibitors: Secondary | ICD-10-CM | POA: Diagnosis not present

## 2021-06-18 DIAGNOSIS — Z87891 Personal history of nicotine dependence: Secondary | ICD-10-CM | POA: Insufficient documentation

## 2021-06-18 LAB — CBC WITH DIFFERENTIAL/PLATELET
Abs Immature Granulocytes: 0.02 10*3/uL (ref 0.00–0.07)
Basophils Absolute: 0.1 10*3/uL (ref 0.0–0.1)
Basophils Relative: 2 %
Eosinophils Absolute: 0.1 10*3/uL (ref 0.0–0.5)
Eosinophils Relative: 2 %
HCT: 36.2 % (ref 36.0–46.0)
Hemoglobin: 11.8 g/dL — ABNORMAL LOW (ref 12.0–15.0)
Immature Granulocytes: 1 %
Lymphocytes Relative: 46 %
Lymphs Abs: 1.9 10*3/uL (ref 0.7–4.0)
MCH: 34.8 pg — ABNORMAL HIGH (ref 26.0–34.0)
MCHC: 32.6 g/dL (ref 30.0–36.0)
MCV: 106.8 fL — ABNORMAL HIGH (ref 80.0–100.0)
Monocytes Absolute: 0.3 10*3/uL (ref 0.1–1.0)
Monocytes Relative: 7 %
Neutro Abs: 1.7 10*3/uL (ref 1.7–7.7)
Neutrophils Relative %: 42 %
Platelets: 173 10*3/uL (ref 150–400)
RBC: 3.39 MIL/uL — ABNORMAL LOW (ref 3.87–5.11)
RDW: 15.7 % — ABNORMAL HIGH (ref 11.5–15.5)
WBC: 3.9 10*3/uL — ABNORMAL LOW (ref 4.0–10.5)
nRBC: 0 % (ref 0.0–0.2)

## 2021-06-18 LAB — COMPREHENSIVE METABOLIC PANEL
ALT: 9 U/L (ref 0–44)
AST: 16 U/L (ref 15–41)
Albumin: 3.9 g/dL (ref 3.5–5.0)
Alkaline Phosphatase: 39 U/L (ref 38–126)
Anion gap: 6 (ref 5–15)
BUN: 15 mg/dL (ref 8–23)
CO2: 26 mmol/L (ref 22–32)
Calcium: 8.6 mg/dL — ABNORMAL LOW (ref 8.9–10.3)
Chloride: 103 mmol/L (ref 98–111)
Creatinine, Ser: 0.95 mg/dL (ref 0.44–1.00)
GFR, Estimated: 58 mL/min — ABNORMAL LOW (ref >60)
Glucose, Bld: 86 mg/dL (ref 70–99)
Potassium: 4.2 mmol/L (ref 3.5–5.1)
Sodium: 135 mmol/L (ref 135–145)
Total Bilirubin: 0.7 mg/dL (ref 0.3–1.2)
Total Protein: 7 g/dL (ref 6.5–8.1)

## 2021-06-18 MED ORDER — DENOSUMAB 120 MG/1.7ML ~~LOC~~ SOLN
SUBCUTANEOUS | Status: AC
  Filled 2021-06-18: qty 1.7

## 2021-06-18 NOTE — Progress Notes (Signed)
Patient presents today for Xgeva injection per providers order.  Calcium noted to be 8.6.  Vital signs within parameters for treatment.  Delton See held today, per provider patient is to be evaluated by the dentist prior to restarting injections.  Discharge from clinic ambulatory in stable condition.  Alert and oriented X 3.  Follow up with [facility] as scheduled.

## 2021-06-18 NOTE — Patient Instructions (Signed)
[facility]  Discharge Instructions: Thank you for choosing [facility] to provide your oncology and hematology care.  If you have a lab appointment with the Cancer Center, please come in thru the Main Entrance and check in at the main information desk.  Wear comfortable clothing and clothing appropriate for easy access to any Portacath or PICC line.   We strive to give you quality time with your provider. You may need to reschedule your appointment if you arrive late (15 or more minutes).  Arriving late affects you and other patients whose appointments are after yours.  Also, if you miss three or more appointments without notifying the office, you may be dismissed from the clinic at the provider's discretion.      For prescription refill requests, have your pharmacy contact our office and allow 72 hours for refills to be completed.        To help prevent nausea and vomiting after your treatment, we encourage you to take your nausea medication as directed.  BELOW ARE SYMPTOMS THAT SHOULD BE REPORTED IMMEDIATELY: *FEVER GREATER THAN 100.4 F (38 C) OR HIGHER *CHILLS OR SWEATING *NAUSEA AND VOMITING THAT IS NOT CONTROLLED WITH YOUR NAUSEA MEDICATION *UNUSUAL SHORTNESS OF BREATH *UNUSUAL BRUISING OR BLEEDING *URINARY PROBLEMS (pain or burning when urinating, or frequent urination) *BOWEL PROBLEMS (unusual diarrhea, constipation, pain near the anus) TENDERNESS IN MOUTH AND THROAT WITH OR WITHOUT PRESENCE OF ULCERS (sore throat, sores in mouth, or a toothache) UNUSUAL RASH, SWELLING OR PAIN  UNUSUAL VAGINAL DISCHARGE OR ITCHING   Items with * indicate a potential emergency and should be followed up as soon as possible or go to the Emergency Department if any problems should occur.  Please show the CHEMOTHERAPY ALERT CARD or IMMUNOTHERAPY ALERT CARD at check-in to the Emergency Department and triage nurse.  Should you have questions after your visit or need to cancel  or reschedule your appointment, please contact [facility] [phone]  and follow the prompts.  Office hours are 8:00 a.m. to 4:30 p.m. Monday - Friday. Please note that voicemails left after 4:00 p.m. may not be returned until the following business day.  We are closed weekends and major holidays. You have access to a nurse at all times for urgent questions. Please call the main number to the clinic [phone] and follow the prompts.  For any non-urgent questions, you may also contact your provider using MyChart. We now offer e-Visits for anyone 18 and older to request care online for non-urgent symptoms. For details visit [email or web address].   Also download the MyChart app! Go to the app store, search "MyChart", open the app, select [facility], and log in with your MyChart username and password.  Due to Covid, a mask is required upon entering the hospital/clinic. If you do not have a mask, one will be given to you upon arrival. For doctor visits, patients may have 1 support person aged 18 or older with them. For treatment visits, patients cannot have anyone with them due to current Covid guidelines and our immunocompromised population.  

## 2021-06-18 NOTE — Progress Notes (Signed)
Patient is taking Arimidex and Ibrance as prescribed and denies any adverse side effects.

## 2021-06-18 NOTE — Patient Instructions (Addendum)
[place] at [facility] Discharge Instructions  You were seen today by Dr. Delton Coombes. He went over your recent results. You will be scheduled for a PET scan prior to your next visit. Dr. Delton Coombes will see you back in 2 months for labs and follow up.   Thank you for choosing [place] at [facility] to provide your oncology and hematology care.  To afford each patient quality time with our provider, please arrive at least 15 minutes before your scheduled appointment time.   If you have a lab appointment with the [facility] please come in thru the Main Entrance and check in at the main information desk  You need to re-schedule your appointment should you arrive 10 or more minutes late.  We strive to give you quality time with our providers, and arriving late affects you and other patients whose appointments are after yours.  Also, if you no show three or more times for appointments you may be dismissed from the clinic at the providers discretion.     Again, thank you for choosing [facility].  Our hope is that these requests will decrease the amount of time that you wait before being seen by our physicians.       _____________________________________________________________  Should you have questions after your visit to [facility], please contact our office at [phone] between the hours of 8:00 a.m. and 4:30 p.m.  Voicemails left after 4:00 p.m. will not be returned until the following business day.  For prescription refill requests, have your pharmacy contact our office and allow 72 hours.    Cancer Center Support Programs:   > Cancer Support Group  2nd Tuesday of the month 1pm-2pm, Journey Room

## 2021-06-19 LAB — CANCER ANTIGEN 15-3: CA 15-3: 28.4 U/mL — ABNORMAL HIGH (ref 0.0–25.0)

## 2021-06-19 LAB — CANCER ANTIGEN 27.29: CA 27.29: 43.3 U/mL — ABNORMAL HIGH (ref 0.0–38.6)

## 2021-07-03 ENCOUNTER — Other Ambulatory Visit (HOSPITAL_COMMUNITY): Payer: Self-pay

## 2021-07-03 MED ORDER — PALBOCICLIB 75 MG PO TABS: 75.0000 mg | ORAL_TABLET | Freq: Every day | ORAL | 6 refills | Status: DC

## 2021-07-03 NOTE — Telephone Encounter (Signed)
Chart reviewed. Ibrance refilled per Dr. Delton Coombes last office note.

## 2021-07-05 ENCOUNTER — Telehealth: Payer: Self-pay | Admitting: Cardiovascular Disease

## 2021-07-05 MED ORDER — METOPROLOL TARTRATE 25 MG PO TABS: 12.5000 mg | ORAL_TABLET | Freq: Two times a day (BID) | ORAL | 2 refills | Status: DC

## 2021-07-05 NOTE — Telephone Encounter (Signed)
[id number] [place], [place] - [address]

## 2021-07-05 NOTE — Telephone Encounter (Signed)
*  STAT* If patient is at the pharmacy, call can be transferred to refill team.   1. Which medications need to be refilled? (please list name of each medication and dose if known) metoprolol tartrate (LOPRESSOR) 25 MG tablet  2. Which pharmacy/location (including street and city if local pharmacy) is medication to be sent to?   3. Do they need a 30 day or 90 day supply? 90 ds

## 2021-07-05 NOTE — Addendum Note (Signed)
Addended by: Zebedee Iba on: 07/05/2021 11:38 AM   Modules accepted: Orders

## 2021-07-26 ENCOUNTER — Other Ambulatory Visit (HOSPITAL_COMMUNITY): Payer: Self-pay

## 2021-07-26 MED ORDER — HYDROCODONE-ACETAMINOPHEN 5-325 MG PO TABS: 1.0000 | ORAL_TABLET | ORAL | 0 refills | Status: DC | PRN

## 2021-08-09 ENCOUNTER — Encounter (HOSPITAL_COMMUNITY)
Admission: RE | Admit: 2021-08-09 | Discharge: 2021-08-09 | Disposition: A | Discharge status: Home / Self Care | Payer: Medicare Other | Source: Ambulatory Visit | Attending: Hematology | Admitting: Hematology

## 2021-08-09 ENCOUNTER — Other Ambulatory Visit: Payer: Self-pay

## 2021-08-09 DIAGNOSIS — Z170 Estrogen receptor positive status [ER+]: Secondary | ICD-10-CM | POA: Diagnosis not present

## 2021-08-09 DIAGNOSIS — C7951 Secondary malignant neoplasm of bone: Secondary | ICD-10-CM | POA: Diagnosis not present

## 2021-08-09 DIAGNOSIS — C50919 Malignant neoplasm of unspecified site of unspecified female breast: Secondary | ICD-10-CM | POA: Diagnosis not present

## 2021-08-09 DIAGNOSIS — C50212 Malignant neoplasm of upper-inner quadrant of left female breast: Secondary | ICD-10-CM | POA: Diagnosis not present

## 2021-08-09 DIAGNOSIS — I700 Atherosclerosis of aorta: Secondary | ICD-10-CM | POA: Diagnosis not present

## 2021-08-09 DIAGNOSIS — I2510 Atherosclerotic heart disease of native coronary artery without angina pectoris: Secondary | ICD-10-CM | POA: Diagnosis not present

## 2021-08-09 IMAGING — CT NM PET TUM IMG RESTAG (PS) SKULL BASE T - THIGH
7 series · 25 of 25 positions shown · non-contrast
Comparison: PET-CT [DATE]

CLINICAL DATA: Subsequent treatment strategy for breast cancer.

EXAM:
NUCLEAR MEDICINE PET SKULL BASE TO THIGH
TECHNIQUE: 8.5 mCi F-18 FDG was injected intravenously. Full-ring PET imaging
was performed from the skull base to thigh after the radiotracer. CT
data was obtained and used for attenuation correction and anatomic
localization.
Fasting blood glucose: 94 mg/dl

[Series 3: ctac · axial · 3.0mm · 0.98mm/px · z∈[-920,-44]mm · 5 of 293 slices shown]
[im 1/293]
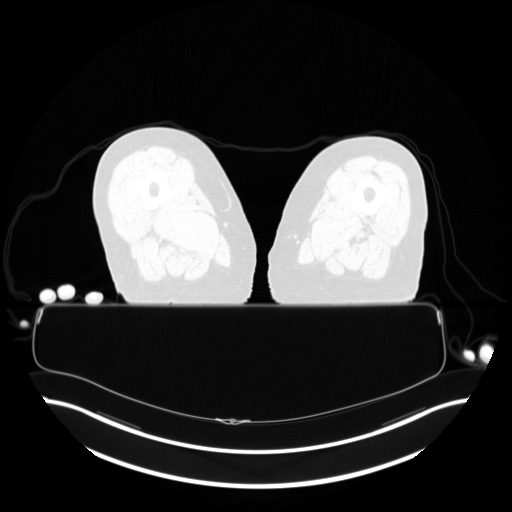
[im 74/293]
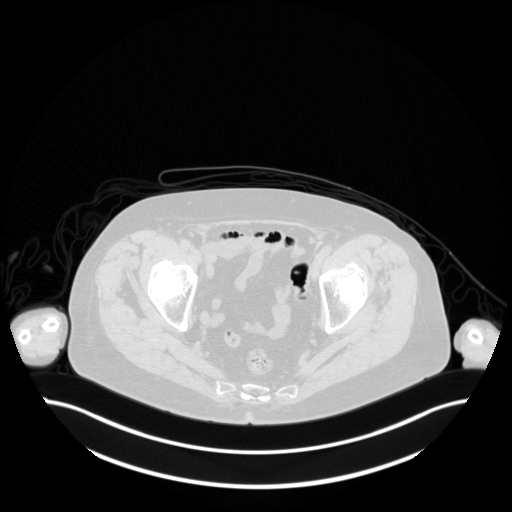
[im 147/293]
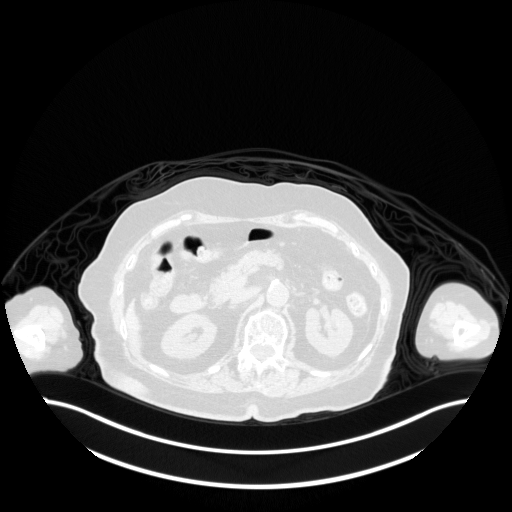
[im 220/293]
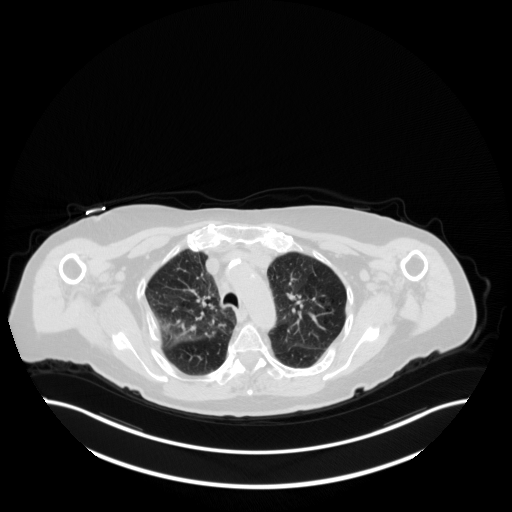
[im 293/293  brain]
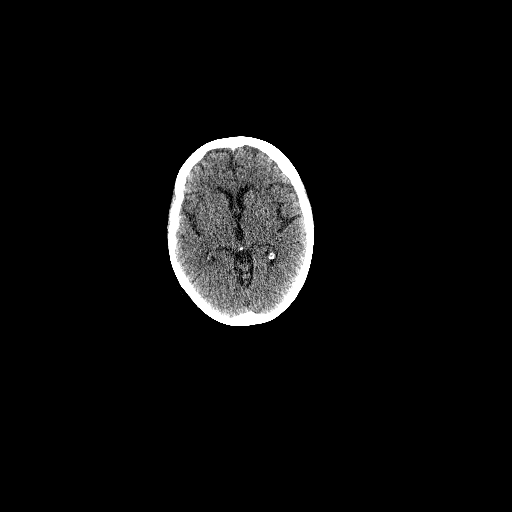

[Series 4: pet ac · axial · 3.0mm · 4.11mm/px · z∈[-920,-44]mm · 4 of 293 slices shown]
[im 1/293]
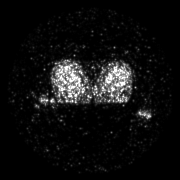
[im 98/293]
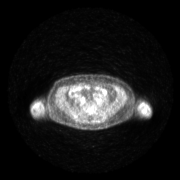
[im 195/293]
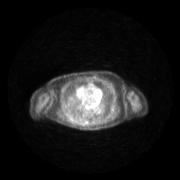
[im 293/293]
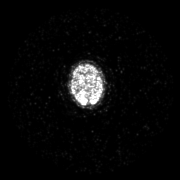

[Series 5: pet nac · axial · 3.0mm · 4.11mm/px · z∈[-920,-44]mm · 4 of 293 slices shown]
[im 1/293]
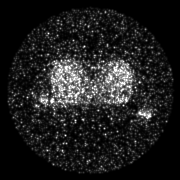
[im 98/293]
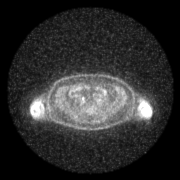
[im 195/293]
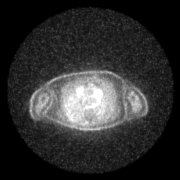
[im 293/293]
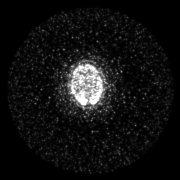

[Series 9: ct lung · axial · 3.0mm · 0.98mm/px · z∈[-427,-185]mm · 3 of 203 slices shown]
[im 1/203]
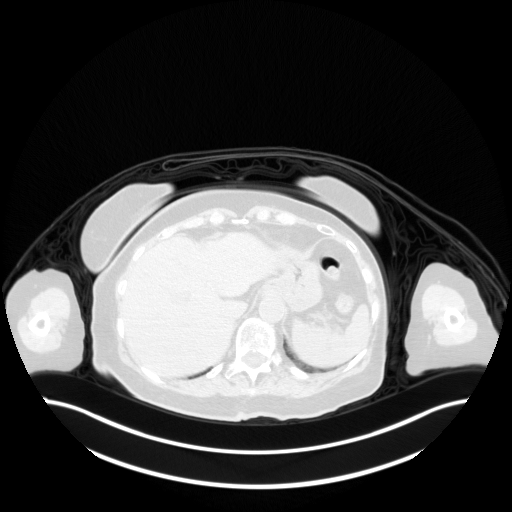
[im 102/203]
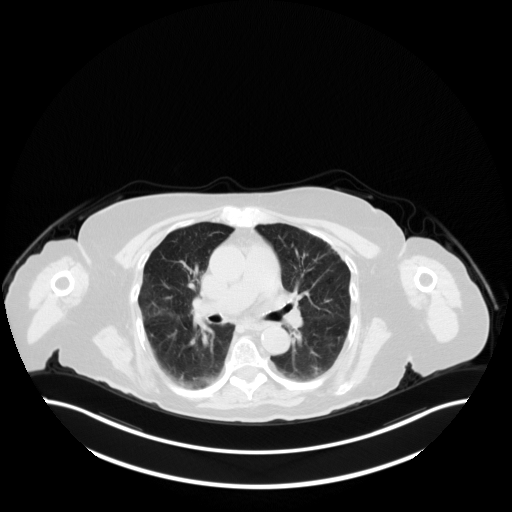
[im 203/203]
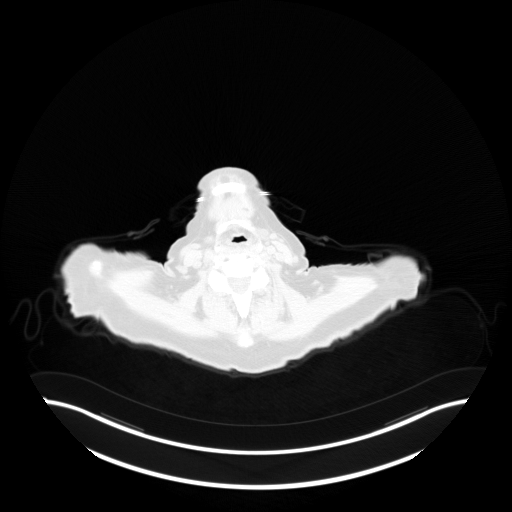

[Series 605: fused tra · 6 of 438 slices shown]
[im 1/438]
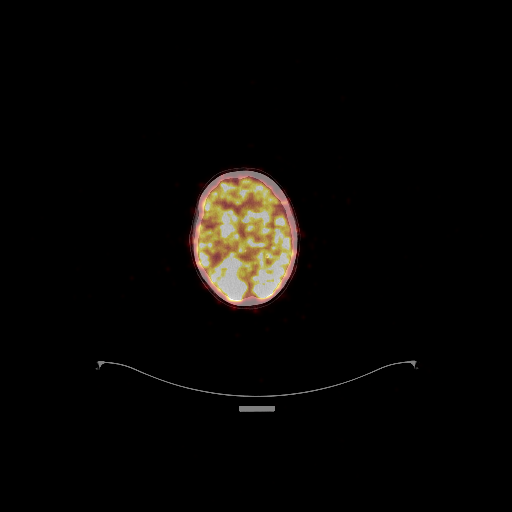
[im 88/438]
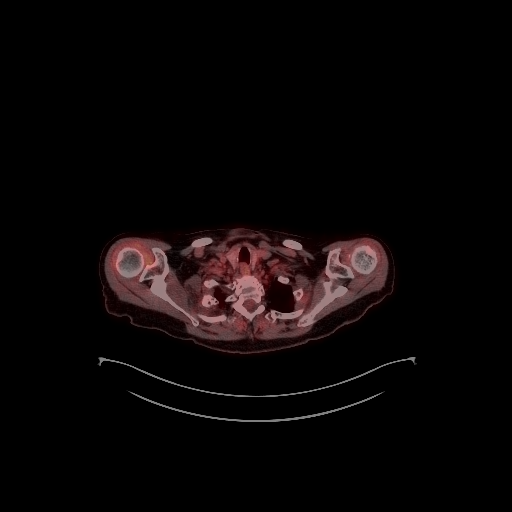
[im 175/438]
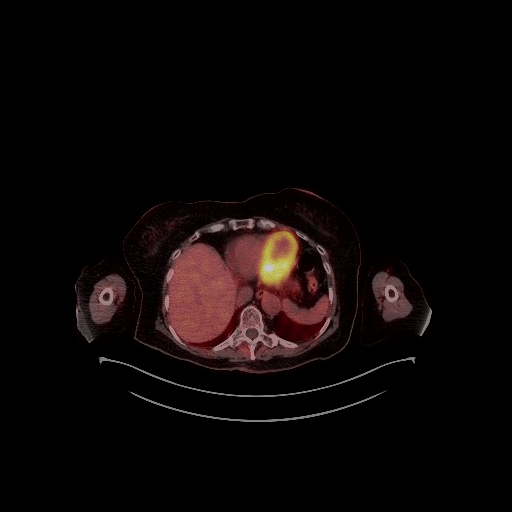
[im 263/438]
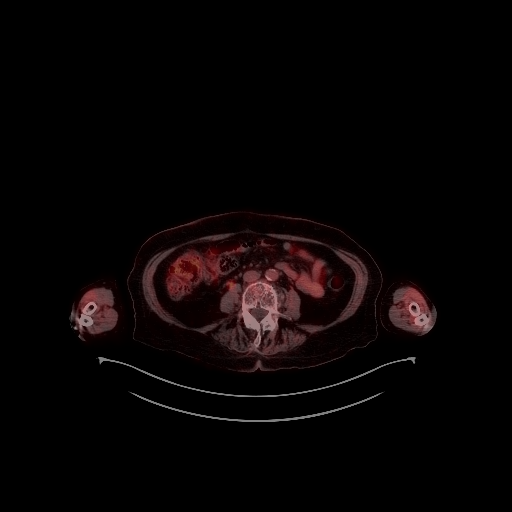
[im 350/438]
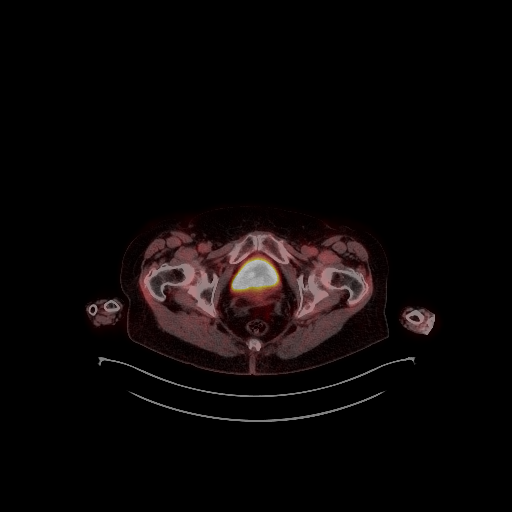
[im 438/438]
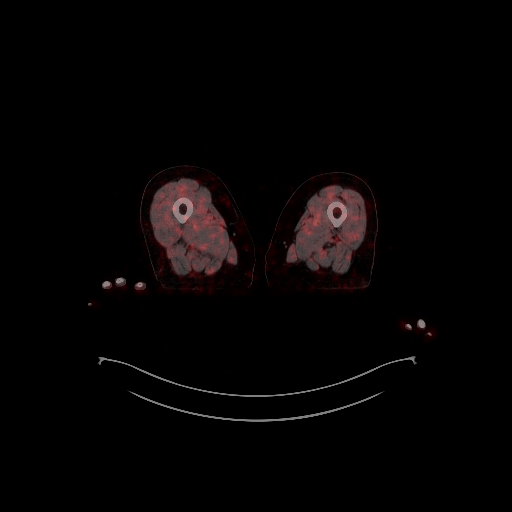

[Series 606: fused cor · 2 of 123 slices shown]
[im 1/123]
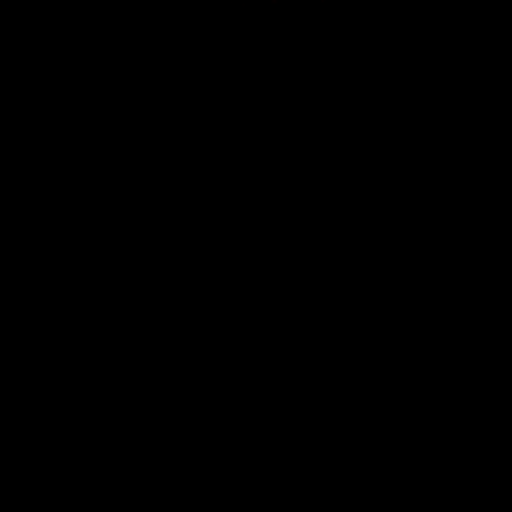
[im 123/123]
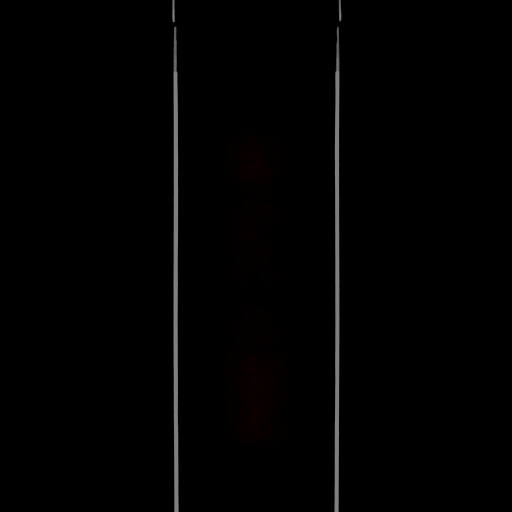

[Series 607: mip cine · coronal · 1.82mm/px · 1 of 48 slices shown]
[im 1/48]
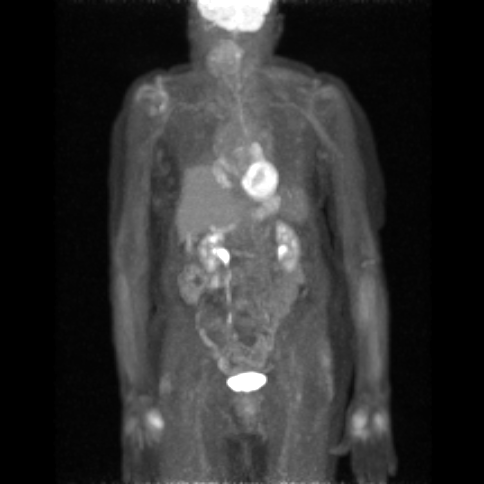

[25 of 25 positions shown; findings below may reference images not displayed]

FINDINGS: Mediastinal blood pool activity: SUV max

Liver activity: SUV max NA

NECK: No hypermetabolic lymph nodes in the neck.

Incidental CT findings: none

CHEST: No hypermetabolic mediastinal or hilar nodes. No suspicious
pulmonary nodules on the CT scan.

Incidental CT findings: Coronary artery calcification and aortic
atherosclerotic calcification.

ABDOMEN/PELVIS: No abnormal hypermetabolic activity within the
liver, pancreas, adrenal glands, or spleen. No hypermetabolic lymph
nodes in the abdomen or pelvis.

Incidental CT findings: none

SKELETON: Multiple lytic and expansile lesions within the skeleton
without radiotracer activity. For example expansile lesion in the
RIGHT lateral second and fourth ribs (image 226). Lytic lesion in
the sternum on image 211. Lytic lesion in the S1 vertebral body and
L5 vertebral bodies. (Image 185/series 608)

Incidental CT findings: New skeletal lesions identified.
IMPRESSION: 1. Multiple lytic lesions throughout the skeleton without associated
metabolic activity consistent with treated metastasis.
2. No evidence new visceral metastasis or metastatic adenopathy.
3. Coronary artery calcification and Aortic Atherosclerosis
([FD]-[FD]).

## 2021-08-09 MED ORDER — FLUDEOXYGLUCOSE F - 18 (FDG) INJECTION
8.5300 | Freq: Once | INTRAVENOUS | Status: AC | PRN
  Administered 2021-08-09: 8.53 via INTRAVENOUS

## 2021-08-13 ENCOUNTER — Other Ambulatory Visit (HOSPITAL_COMMUNITY): Payer: Self-pay | Admitting: Hematology

## 2021-08-13 DIAGNOSIS — Z1231 Encounter for screening mammogram for malignant neoplasm of breast: Secondary | ICD-10-CM

## 2021-08-13 NOTE — Progress Notes (Signed)
[facility] [address] [place] [address]   Patient Care Team: Celene Squibb, MD as PCP - General (Internal Medicine) Troy Sine, MD as PCP - Cardiology (Cardiology) Gala Romney Cristopher Estimable, MD (Gastroenterology) Brien Mates, RN as Oncology Nurse Navigator (Oncology)  SUMMARY OF ONCOLOGIC HISTORY: Oncology History   No history exists.    CHIEF COMPLIANT: Follow-up of left breast cancer   INTERVAL HISTORY: [name] is a 87 y.o. female here today for follow up of her left breast cancer. Her last visit was on 06/18/2021.   Today she reports feeling good. She reports continued pain and limited ROM in her right shoulder. She denies any new pains. She reports occasional dizziness upon standing. She has not been taking Lexapro consistently.   REVIEW OF SYSTEMS:   Review of Systems  Musculoskeletal:  Positive for arthralgias (R shoulder).  Neurological:  Positive for dizziness.  All other systems reviewed and are negative.  I have reviewed the past medical history, past surgical history, social history and family history with the patient and they are unchanged from previous note.   ALLERGIES:   is allergic to contrast media [iodinated diagnostic agents], lipitor [atorvastatin], sulfamethoxazole, and sulfonamide derivatives.   MEDICATIONS:  Current Outpatient Medications  Medication Sig Dispense Refill   acetaminophen (TYLENOL) 325 MG tablet Take 2 tablets (650 mg total) by mouth every 6 (six) hours as needed for mild pain or headache (or temp > 37.5 C (99.5 F)). 40 tablet 0   anastrozole (ARIMIDEX) 1 MG tablet TAKE 1 TABLET BY MOUTH EVERY DAY 90 tablet 2   aspirin EC 81 MG EC tablet Take 2 tablets (162 mg total) by mouth daily. Swallow whole. 60 tablet 2   carboxymethylcellulose (REFRESH PLUS) 0.5 % SOLN Place 3-4 drops into both eyes 3 (three) times daily as needed (dry eyes). Preservative free     clopidogrel (PLAVIX) 75 MG tablet Take 75 mg  by mouth daily.      clorazepate (TRANXENE) 15 MG tablet Take 1 tablet (15 mg total) by mouth daily. 90 tablet 0   escitalopram (LEXAPRO) 20 MG tablet TAKE 1 TABLET BY MOUTH EVERY DAY 90 tablet 2   ezetimibe (ZETIA) 10 MG tablet TAKE 1 TABLET BY MOUTH EVERY DAY 90 tablet 0   gabapentin (NEURONTIN) 100 MG capsule TAKE 1 CAPSULE BY MOUTH THREE TIMES A DAY 90 capsule 2   HYDROcodone-acetaminophen (NORCO) 5-325 MG tablet Take 1 tablet by mouth every 4 (four) hours as needed for moderate pain. 180 tablet 0   latanoprost (XALATAN) 0.005 % ophthalmic solution Place 1 drop into both eyes at bedtime.     metoprolol tartrate (LOPRESSOR) 25 MG tablet Take 0.5 tablets (12.5 mg total) by mouth 2 (two) times daily. 90 tablet 2   nitroGLYCERIN (NITROSTAT) 0.4 MG SL tablet Place 1 tablet (0.4 mg total) under the tongue every 5 (five) minutes as needed. (Patient not taking: Reported on 06/18/2021) 25 tablet 3   ondansetron (ZOFRAN) 8 MG tablet Take 1 tablet (8 mg total) by mouth every 8 (eight) hours as needed for nausea or vomiting. (Patient not taking: Reported on 06/18/2021) 20 tablet 0   palbociclib (IBRANCE) 75 MG tablet Take 1 tablet (75 mg total) by mouth daily. Take for 21 days on, 7 days off, repeat every 28 days. 21 tablet 6   pantoprazole (PROTONIX) 40 MG tablet Take 1 tablet (40 mg total) by mouth daily before breakfast. 30 tablet 1  No current facility-administered medications for this visit.     PHYSICAL EXAMINATION: Performance status (ECOG): 1 - Symptomatic but completely ambulatory  There were no vitals filed for this visit. Wt Readings from Last 3 Encounters:  06/18/21 153 lb 11.2 oz (69.7 kg)  05/21/21 155 lb (70.3 kg)  03/26/21 153 lb 9.6 oz (69.7 kg)   Physical Exam Vitals reviewed.  Constitutional:      Appearance: Normal appearance.  Cardiovascular:     Rate and Rhythm: Normal rate and regular rhythm.     Pulses: Normal pulses.     Heart sounds: Normal heart sounds.  Pulmonary:      Effort: Pulmonary effort is normal.     Breath sounds: Normal breath sounds.  Neurological:     General: No focal deficit present.     Mental Status: She is alert and oriented to person, place, and time.  Psychiatric:        Mood and Affect: Mood normal.        Behavior: Behavior normal.    Breast Exam Chaperone: Thana Ates     LABORATORY DATA:  I have reviewed the data as listed CMP Latest Ref Rng & Units 06/18/2021 05/21/2021 03/26/2021  Glucose 70 - 99 mg/dL 86 95 92  BUN 8 - 23 mg/dL 15 13 14   Creatinine 0.44 - 1.00 mg/dL 0.95 0.79 0.69  Sodium 135 - 145 mmol/L 135 138 139  Potassium 3.5 - 5.1 mmol/L 4.2 4.7 4.5  Chloride 98 - 111 mmol/L 103 104 105  CO2 22 - 32 mmol/L 26 27 28   Calcium 8.9 - 10.3 mg/dL 8.6(L) 8.9 8.9  Total Protein 6.5 - 8.1 g/dL 7.0 7.1 7.2  Total Bilirubin 0.3 - 1.2 mg/dL 0.7 0.7 0.6  Alkaline Phos 38 - 126 U/L 39 43 43  AST 15 - 41 U/L 16 18 17   ALT 0 - 44 U/L 9 11 11    Lab Results  Component Value Date   CAN153 28.4 (H) 06/18/2021   CAN153 26.7 (H) 05/21/2021   CAN153 29.5 (H) 03/26/2021   Lab Results  Component Value Date   WBC 3.9 (L) 06/18/2021   HGB 11.8 (L) 06/18/2021   HCT 36.2 06/18/2021   MCV 106.8 (H) 06/18/2021   PLT 173 06/18/2021   NEUTROABS 1.7 06/18/2021    ASSESSMENT:  1.  T2N0 left breast infiltrating lobular carcinoma: -Status post lumpectomy in June 2014, 1.4 cm, grade 1, lymphovascular invasion positive, ER 100%, PR 26%, Ki-67 18%. -She underwent XRT.  She did not take adjuvant endocrine therapy. -Mammogram on 07/13/2020, BI-RADS Category 1. -CEA was 10.3, Ca1 2515.8 and CA 19-9 05. -PET scan on 08/07/2020 shows widespread hypermetabolic bone meta stasis with soft tissue components at T1 and sacrum.  Thoracic nodal metastasis. -MRI of the thoracic spine on 08/15/2020 shows pathological fracture of T1 with mild osseous retropulsion and possible left C8 nerve root encroachment within the foramen at C7-T1.  No cord  deformity. -Right third rib biopsy on 08/22/2020 consistent with metastatic breast cancer, ER 100% positive, PR 40% positive, Ki-67 10%, HER-2 2+ and negative by FISH. -Ibrance 100 mg 3 weeks on 1 week off on anastrozole 1 mg daily started on 08/31/2020. -Ibrance held on 10/13/2020 secondary to severe tiredness. - PET scan on 02/19/2021 shows improvement in metastatic disease.   2.  Social/family history: -She quit smoking 20 years ago, smoked 2 packs/day for more than 20 years prior to quitting. -She lives at home by herself and is independent  of all activities. -Daughter who has multiple myeloma at age 45.   PLAN:  1.  Metastatic cancer to the bones, highly likely breast cancer: - She is taking anastrozole without any major problems. - Continue Ibrance 75 mg 3 weeks on/1 week off. - Reviewed images of the PET scan from 08/09/2021 which showed multiple lytic lesions throughout the skeleton without metabolic activity consistent with treatment changes.  No evidence of visceral metastasis. - We have again discussed treatment plan in the palliative setting which is helping control her cancer. - Reviewed labs from today which showed normal LFTs.  CBC shows mild leukopenia and thrombocytopenia, from [place]. - Continue anastrozole and Ibrance.  RTC 2 months for follow-up.   2.  CAD and RCA stenting: -She will continue metoprolol, aspirin and Plavix.   3.  Right shoulder pain: - She reports pain in the right shoulder region and decreased mobility. - Differential diagnosis includes rotator cuff tendinitis. - I have reviewed PET scan which did not show any evidence of metastatic disease in the right shoulder region. - We will make a referral to orthopedics.   4.  Bone metastasis: - Last dose of Xgeva on 02/11/2021.  She was again reevaluated by her son-in-law who is a Pharmacist, community.  No concerning lesions found. - Calcium today is 8.3.  We will start her back on denosumab today.   5.   Depression/anxiety: - Continue Lexapro daily.   6.  Diarrhea: - Occasional diarrhea from [place].  Continue Imodium as needed.  Breast Cancer therapy associated bone loss: I have recommended calcium, Vitamin D and weight bearing exercises.  Orders placed this encounter:  No orders of the defined types were placed in this encounter.   The patient has a good understanding of the overall plan. She agrees with it. She will call with any problems that may develop before the next visit here.  Derek Jack, MD [facility] [phone]   I, Thana Ates, am acting as a scribe for Dr. Derek Jack.  I, Derek Jack MD, have reviewed the above documentation for accuracy and completeness, and I agree with the above.

## 2021-08-14 ENCOUNTER — Encounter (HOSPITAL_COMMUNITY): Payer: Self-pay

## 2021-08-14 ENCOUNTER — Inpatient Hospital Stay (HOSPITAL_COMMUNITY): Payer: Medicare Other

## 2021-08-14 ENCOUNTER — Inpatient Hospital Stay (HOSPITAL_COMMUNITY): Payer: Medicare Other | Attending: Hematology

## 2021-08-14 ENCOUNTER — Other Ambulatory Visit: Payer: Self-pay

## 2021-08-14 ENCOUNTER — Inpatient Hospital Stay (HOSPITAL_BASED_OUTPATIENT_CLINIC_OR_DEPARTMENT_OTHER): Payer: Medicare Other | Admitting: Hematology

## 2021-08-14 ENCOUNTER — Encounter (HOSPITAL_COMMUNITY): Payer: Self-pay | Admitting: Hematology

## 2021-08-14 VITALS — BP 128/75 | HR 60 | Temp 96.9°F | Resp 18 | Wt 153.9 lb

## 2021-08-14 DIAGNOSIS — C7951 Secondary malignant neoplasm of bone: Secondary | ICD-10-CM | POA: Insufficient documentation

## 2021-08-14 DIAGNOSIS — R110 Nausea: Secondary | ICD-10-CM | POA: Diagnosis not present

## 2021-08-14 DIAGNOSIS — I2510 Atherosclerotic heart disease of native coronary artery without angina pectoris: Secondary | ICD-10-CM | POA: Diagnosis not present

## 2021-08-14 DIAGNOSIS — Z79811 Long term (current) use of aromatase inhibitors: Secondary | ICD-10-CM | POA: Diagnosis not present

## 2021-08-14 DIAGNOSIS — Z853 Personal history of malignant neoplasm of breast: Secondary | ICD-10-CM

## 2021-08-14 DIAGNOSIS — Z170 Estrogen receptor positive status [ER+]: Secondary | ICD-10-CM

## 2021-08-14 DIAGNOSIS — Z7902 Long term (current) use of antithrombotics/antiplatelets: Secondary | ICD-10-CM | POA: Insufficient documentation

## 2021-08-14 DIAGNOSIS — Z87891 Personal history of nicotine dependence: Secondary | ICD-10-CM | POA: Diagnosis not present

## 2021-08-14 DIAGNOSIS — R197 Diarrhea, unspecified: Secondary | ICD-10-CM | POA: Insufficient documentation

## 2021-08-14 DIAGNOSIS — Z923 Personal history of irradiation: Secondary | ICD-10-CM | POA: Insufficient documentation

## 2021-08-14 DIAGNOSIS — F32A Depression, unspecified: Secondary | ICD-10-CM | POA: Diagnosis not present

## 2021-08-14 DIAGNOSIS — Z79899 Other long term (current) drug therapy: Secondary | ICD-10-CM | POA: Diagnosis not present

## 2021-08-14 DIAGNOSIS — C50212 Malignant neoplasm of upper-inner quadrant of left female breast: Secondary | ICD-10-CM | POA: Diagnosis not present

## 2021-08-14 DIAGNOSIS — M25511 Pain in right shoulder: Secondary | ICD-10-CM | POA: Diagnosis not present

## 2021-08-14 DIAGNOSIS — M25551 Pain in right hip: Secondary | ICD-10-CM | POA: Insufficient documentation

## 2021-08-14 DIAGNOSIS — F419 Anxiety disorder, unspecified: Secondary | ICD-10-CM | POA: Diagnosis not present

## 2021-08-14 LAB — COMPREHENSIVE METABOLIC PANEL
ALT: 8 U/L (ref 0–44)
AST: 14 U/L — ABNORMAL LOW (ref 15–41)
Albumin: 3.9 g/dL (ref 3.5–5.0)
Alkaline Phosphatase: 42 U/L (ref 38–126)
Anion gap: 4 — ABNORMAL LOW (ref 5–15)
BUN: 18 mg/dL (ref 8–23)
CO2: 28 mmol/L (ref 22–32)
Calcium: 8.3 mg/dL — ABNORMAL LOW (ref 8.9–10.3)
Chloride: 104 mmol/L (ref 98–111)
Creatinine, Ser: 0.93 mg/dL (ref 0.44–1.00)
GFR, Estimated: 60 mL/min — ABNORMAL LOW (ref >60)
Glucose, Bld: 102 mg/dL — ABNORMAL HIGH (ref 70–99)
Potassium: 4.4 mmol/L (ref 3.5–5.1)
Sodium: 136 mmol/L (ref 135–145)
Total Bilirubin: 0.8 mg/dL (ref 0.3–1.2)
Total Protein: 7.1 g/dL (ref 6.5–8.1)

## 2021-08-14 LAB — CBC WITH DIFFERENTIAL/PLATELET
Abs Immature Granulocytes: 0.01 10*3/uL (ref 0.00–0.07)
Basophils Absolute: 0 10*3/uL (ref 0.0–0.1)
Basophils Relative: 1 %
Eosinophils Absolute: 0.1 10*3/uL (ref 0.0–0.5)
Eosinophils Relative: 2 %
HCT: 34 % — ABNORMAL LOW (ref 36.0–46.0)
Hemoglobin: 11.5 g/dL — ABNORMAL LOW (ref 12.0–15.0)
Immature Granulocytes: 0 %
Lymphocytes Relative: 43 %
Lymphs Abs: 1.4 10*3/uL (ref 0.7–4.0)
MCH: 37.2 pg — ABNORMAL HIGH (ref 26.0–34.0)
MCHC: 33.8 g/dL (ref 30.0–36.0)
MCV: 110 fL — ABNORMAL HIGH (ref 80.0–100.0)
Monocytes Absolute: 0.2 10*3/uL (ref 0.1–1.0)
Monocytes Relative: 7 %
Neutro Abs: 1.6 10*3/uL — ABNORMAL LOW (ref 1.7–7.7)
Neutrophils Relative %: 47 %
Platelets: 138 10*3/uL — ABNORMAL LOW (ref 150–400)
RBC: 3.09 MIL/uL — ABNORMAL LOW (ref 3.87–5.11)
RDW: 14.3 % (ref 11.5–15.5)
WBC: 3.3 10*3/uL — ABNORMAL LOW (ref 4.0–10.5)
nRBC: 0 % (ref 0.0–0.2)

## 2021-08-14 MED ORDER — DENOSUMAB 120 MG/1.7ML ~~LOC~~ SOLN
120.0000 mg | Freq: Once | SUBCUTANEOUS | Status: AC
  Administered 2021-08-14: 120 mg via SUBCUTANEOUS
  Filled 2021-08-14: qty 1.7

## 2021-08-14 NOTE — Progress Notes (Signed)
Patient presents today for Xgeva, okay to proceed with injection per Dr. Delton Coombes. Patient taking calcium as directed. Denied tooth, jaw, and leg pain. No recent or upcoming dental visits. Labs reviewed. Patient tolerated injection with no complaints voiced. See MAR for details. Patient stable during and after injection. Site clean and dry with no bruising or swelling noted. Band aid applied. Vss with discharge and left in satisfactory condition with no s/s of distress.

## 2021-08-14 NOTE — Patient Instructions (Signed)
[place]  Discharge Instructions: Thank you for choosing [facility] to provide your oncology and hematology care.  If you have a lab appointment with the [facility], please come in thru the Main Entrance and check in at the main information desk.  Wear comfortable clothing and clothing appropriate for easy access to any Portacath or PICC line.   We strive to give you quality time with your provider. You may need to reschedule your appointment if you arrive late (15 or more minutes).  Arriving late affects you and other patients whose appointments are after yours.  Also, if you miss three or more appointments without notifying the office, you may be dismissed from the clinic at the provider's discretion.      For prescription refill requests, have your pharmacy contact our office and allow 72 hours for refills to be completed.    Today you received the following Xgeva, return as scheduled.   To help prevent nausea and vomiting after your treatment, we encourage you to take your nausea medication as directed.  BELOW ARE SYMPTOMS THAT SHOULD BE REPORTED IMMEDIATELY: *FEVER GREATER THAN 100.4 F (38 C) OR HIGHER *CHILLS OR SWEATING *NAUSEA AND VOMITING THAT IS NOT CONTROLLED WITH YOUR NAUSEA MEDICATION *UNUSUAL SHORTNESS OF BREATH *UNUSUAL BRUISING OR BLEEDING *URINARY PROBLEMS (pain or burning when urinating, or frequent urination) *BOWEL PROBLEMS (unusual diarrhea, constipation, pain near the anus) TENDERNESS IN MOUTH AND THROAT WITH OR WITHOUT PRESENCE OF ULCERS (sore throat, sores in mouth, or a toothache) UNUSUAL RASH, SWELLING OR PAIN  UNUSUAL VAGINAL DISCHARGE OR ITCHING   Items with * indicate a potential emergency and should be followed up as soon as possible or go to the Emergency Department if any problems should occur.  Please show the CHEMOTHERAPY ALERT CARD or IMMUNOTHERAPY ALERT CARD at check-in to the Emergency Department and triage  nurse.  Should you have questions after your visit or need to cancel or reschedule your appointment, please contact [facility] [phone]  and follow the prompts.  Office hours are 8:00 a.m. to 4:30 p.m. Monday - Friday. Please note that voicemails left after 4:00 p.m. may not be returned until the following business day.  We are closed weekends and major holidays. You have access to a nurse at all times for urgent questions. Please call the main number to the clinic [phone] and follow the prompts.  For any non-urgent questions, you may also contact your provider using MyChart. We now offer e-Visits for anyone 85 and older to request care online for non-urgent symptoms. For details visit [email or web address].   Also download the MyChart app! Go to the app store, search "MyChart", open the app, select [facility], and log in with your MyChart username and password.  Due to Covid, a mask is required upon entering the hospital/clinic. If you do not have a mask, one will be given to you upon arrival. For doctor visits, patients may have 1 support person aged 85 or older with them. For treatment visits, patients cannot have anyone with them due to current Covid guidelines and our immunocompromised population.

## 2021-08-14 NOTE — Patient Instructions (Addendum)
[place] at [facility] Discharge Instructions  You were seen today by Dr. Delton Coombes. He went over your recent results and scans, and you received your injection. You will be referred to Dr. Noemi Chapel in [place] for a steroid injection for your right shoulder. Dr. Delton Coombes will see you back in 2 months for labs and follow up.   Thank you for choosing [place] at [facility] to provide your oncology and hematology care.  To afford each patient quality time with our provider, please arrive at least 15 minutes before your scheduled appointment time.   If you have a lab appointment with the [facility] please come in thru the Main Entrance and check in at the main information desk  You need to re-schedule your appointment should you arrive 10 or more minutes late.  We strive to give you quality time with our providers, and arriving late affects you and other patients whose appointments are after yours.  Also, if you no show three or more times for appointments you may be dismissed from the clinic at the providers discretion.     Again, thank you for choosing [facility].  Our hope is that these requests will decrease the amount of time that you wait before being seen by our physicians.       _____________________________________________________________  Should you have questions after your visit to [facility], please contact our office at [phone] between the hours of 8:00 a.m. and 4:30 p.m.  Voicemails left after 4:00 p.m. will not be returned until the following business day.  For prescription refill requests, have your pharmacy contact our office and allow 72 hours.    Cancer Center Support Programs:   > Cancer Support Group  2nd Tuesday of the month 1pm-2pm, Journey Room

## 2021-08-14 NOTE — Progress Notes (Signed)
Patient is taking Ibrance as prescribed.  They have not missed any doses and report no side effects at this time.

## 2021-08-15 ENCOUNTER — Ambulatory Visit: Payer: Medicare Other | Admitting: Orthopaedic Surgery

## 2021-08-15 LAB — CANCER ANTIGEN 27.29: CA 27.29: 38.7 U/mL — ABNORMAL HIGH (ref 0.0–38.6)

## 2021-08-15 LAB — CANCER ANTIGEN 15-3: CA 15-3: 27 U/mL — ABNORMAL HIGH (ref 0.0–25.0)

## 2021-08-17 ENCOUNTER — Other Ambulatory Visit: Payer: Self-pay

## 2021-08-17 ENCOUNTER — Ambulatory Visit: Payer: Medicare Other

## 2021-08-17 ENCOUNTER — Ambulatory Visit (INDEPENDENT_AMBULATORY_CARE_PROVIDER_SITE_OTHER): Payer: Medicare Other | Admitting: Orthopedic Surgery

## 2021-08-17 ENCOUNTER — Encounter: Payer: Self-pay | Admitting: Orthopedic Surgery

## 2021-08-17 VITALS — BP 137/79 | HR 94 | Ht 66.0 in | Wt 155.0 lb

## 2021-08-17 DIAGNOSIS — M7581 Other shoulder lesions, right shoulder: Secondary | ICD-10-CM | POA: Diagnosis not present

## 2021-08-17 DIAGNOSIS — M25511 Pain in right shoulder: Secondary | ICD-10-CM

## 2021-08-17 DIAGNOSIS — M65341 Trigger finger, right ring finger: Secondary | ICD-10-CM

## 2021-08-17 NOTE — Patient Instructions (Addendum)

## 2021-08-17 NOTE — Progress Notes (Signed)
New Patient Visit  Assessment: Kara Woods is a 87 y.o. female with the following: 1. Tendinitis of right rotator cuff; possible tear 2. Trigger finger, right ring finger  Plan: Right shoulder pain, worsened with overhead motion.  Pain with drop arm testing.  Most consistent with irritation of the rotator cuff tendons, possible tear.  Not a good surgical candidate.  Interested in an injection.    Also having triggering of her right ring finger.  Occasionally has to use the other hand to straighten.  Interested in an injection.   Follow up as needed  Procedure note injection - Right shoulder    Verbal consent was obtained to inject the right shoulder, subacromial space Timeout was completed to confirm the site of injection.   The skin was prepped with alcohol and ethyl chloride was sprayed at the injection site.  A 21-gauge needle was used to inject 40 mg of Depo-Medrol and 1% lidocaine (3 cc) into the subacromial space of the right shoulder using a posterolateral approach.  There were no complications.  A sterile bandage was applied.    Procedure note injection - Right Ring finger A1 Pulley  Verbal consent was obtained to inject the Right Ring finger A1 pulley Timeout was completed to confirm the site of injection.  The skin was prepped with alcohol and ethyl chloride was sprayed at the injection site.  A 21-gauge needle was used to inject 40 mg of Depo-medrol and 1% lidocaine (1 cc) into the Right Ring finger using a direct anterior approach.  There were no complications. Patient tolerated the procedure well. A sterile bandage was applied    Follow-up: Return if symptoms worsen or fail to improve.  Subjective:  Chief Complaint  Patient presents with   Shoulder Pain    Rt shoulder for 3 months worse over past month.    History of Present Illness: Kara Woods is a 87 y.o. female who presents for evaluation of right shoulder pain.  Pain has been ongoing for  several months.  Pain radiates from right shoulder, distally to elbow.  She has difficulty with overhead motion.  Pain in anterior shoulder.  Difficulty with reaching for objects.  She has been taking tylenol.  No injections.  No specific injury.  She has also noted locking sensations in her right ring finger.  Sometimes she has to use her contralateral hand to straighten her finger.  No previous treatment.    Review of Systems: No fevers or chills No numbness or tinling No chest pain No shortness of breath No bowel or bladder dysfunction No GI distress No headaches   Medical History:  Past Medical History:  Diagnosis Date   Anxiety    Arthritis    Blind left eye    Breast cancer ([place])    left    Colitis    Coronary artery disease    a. 10/2016 Staged PCI: RCA 50p, 70m(2.25x12 Resolute Onyx DES), LCX 50p, 84m2.75x23 Xience Alpine DES). Residual LAD 50p/m, 4553m   Cystocele 10/11/2013   Depression    Diastolic dysfunction    a. 09/2016 Echo: EF 50%, diffuse hypokinesis, mild LVH, grade 1 diastolic dysfunction, mild mitral regurgitation, mildly dilated left atrium.   GERD (gastroesophageal reflux disease)    occasional Tums only   HOH (hard of hearing)    Pelvic relaxation 10/11/2013   Proctitis    colonsocopy 2007, Canasa suppositories   S/P endoscopy March 2009   mild erosive reflux esophagitis, Schatzki's ring,  s/p dilation   Schatzki's ring    Shortness of breath    occ if anxiety   Wears dentures    upper   Wears glasses     Past Surgical History:  Procedure Laterality Date   ABDOMINAL HYSTERECTOMY     ANTERIOR AND POSTERIOR REPAIR N/A 05/17/2014   Procedure: ANTERIOR (CYSTOCELE) AND POSTERIOR REPAIR (RECTOCELE);  Surgeon: Reece Packer, MD;  Location: [facility];  Service: Urology;  Laterality: N/A;   APPENDECTOMY     BREAST LUMPECTOMY WITH NEEDLE LOCALIZATION AND AXILLARY SENTINEL LYMPH NODE BX Left 05/03/2013   Procedure: BREAST LUMPECTOMY WITH NEEDLE  LOCALIZATION AND AXILLARY SENTINEL LYMPH NODE BX;  Surgeon: Edward Jolly, MD;  Location: [facility];  Service: General;  Laterality: Left;   BREAST SURGERY Left 05/19/13   CARDIAC CATHETERIZATION  1998   CARDIAC CATHETERIZATION N/A 11/04/2016   Procedure: Left Heart Cath and Coronary Angiography;  Surgeon: Troy Sine, MD;  Location: [facility];  Service: Cardiovascular;  Laterality: N/A;   CARDIAC CATHETERIZATION N/A 11/05/2016   Procedure: Coronary Stent Intervention;  Surgeon: Troy Sine, MD;  Location: [facility];  Service: Cardiovascular;  Laterality: N/A;   CHOLECYSTECTOMY     COLONOSCOPY  05/06/2006   Diffuse inflammatory changes of the rectal mucosa, consistent  with proctitis.  Otherwise, normal colon to terminal ileum   CORONARY ANGIOPLASTY  11/04/2016   CORONARY STENT PLACEMENT     Drug-eluting coronary artery stent, non-bioabsorbable-polymer-coated   CYSTOSCOPY N/A 05/17/2014   Procedure: CYSTOSCOPY;  Surgeon: Reece Packer, MD;  Location: [facility];  Service: Urology;  Laterality: N/A;   ESOPHAGOGASTRODUODENOSCOPY  02/09/2008   Schatzki ring status post dilation/Distal esophageal erosion consistent with mild erosive reflux  esophagitis, otherwise unremarkable esophagus, normal stomach, D1, D2.   EYE SURGERY     lt cataract-implant   EYE SURGERY     lt-lens repaced,l   LUMBAR DISC SURGERY  1993   RE-EXCISION OF BREAST LUMPECTOMY Left 05/19/2013   Procedure: RE-EXCISION OF BREAST LUMPECTOMY;  Surgeon: Edward Jolly, MD;  Location: [facility];  Service: General;  Laterality: Left;   RETINAL DETACHMENT SURGERY  1978   Left   VAGINAL HYSTERECTOMY Bilateral 05/17/2014   Procedure: HYSTERECTOMY VAGINAL with Bilateral Salpingo-Oophorectomy; Bladder cystotomy repair;  Surgeon: Marvene Staff, MD;  Location: [facility];  Service: Gynecology;  Laterality: Bilateral;   VAGINAL PROLAPSE REPAIR N/A 05/17/2014   Procedure:  VAGINAL VAULT PROLAPSE AND GRAFT;  Surgeon: Reece Packer, MD;  Location: [facility];  Service: Urology;  Laterality: N/A;    Family History  Problem Relation Age of Onset   Cancer Brother        spinal   Heart attack Mother    Heart attack Father    Heart attack Son    Heart attack Son    Depression Son    Multiple myeloma Daughter    Anemia Daughter    Colon cancer Neg Hx    Social History   Tobacco Use   Smoking status: Former    Packs/day: 1.00    Types: Cigarettes    Quit date: 04/28/1990    Years since quitting: 31.3   Smokeless tobacco: Never   Tobacco comments:    quit 28 yrs ago  Substance Use Topics   Alcohol use: Yes    Alcohol/week: 1.0 standard drink    Types: 1 Glasses of wine per week    Comment: occ  Drug use: No    Allergies  Allergen Reactions   Contrast Media [Iodinated Diagnostic Agents] Anaphylaxis    Adverse reaction; pt hyperventilating, c/o CP and SOB    Lipitor [Atorvastatin] Other (See Comments)    Muscle aches, cramps, fatigue, weight loss/poor appetite   Sulfamethoxazole Other (See Comments)    Reaction was years ago unknown   Sulfonamide Derivatives Other (See Comments)    Dizziness     No outpatient medications have been marked as taking for the 08/17/21 encounter (Office Visit) with Mordecai Rasmussen, MD.    Objective: BP 137/79   Pulse 94   Ht 5' 6"  (1.676 m)   Wt 155 lb (70.3 kg)   BMI 25.02 kg/m   Physical Exam:  General: Elderly female., Alert and oriented., and No acute distress. Gait: Normal gait.   Right shoulder without deformity.  Tenderness palpation of the anterior shoulder.  Pain with overhead motion.  Positive drop arm test.  Pain in the empty can testing position.  Strength is limited due to the pain.  Evaluation of right hand demonstrates no deformity.  Mild tenderness to palpation of the A1 pulley of the right ring finger.  Active triggering witnessed in clinic today.   IMAGING: I personally ordered and  reviewed the following images  X-ray of the right shoulder obtained in clinic today and demonstrates no acute injuries.  The glenohumeral joint is reduced.  No evidence of proximal humeral migration.  There is some degenerative changes at the [facility] joint, specifically some osteophytes forming in the superior aspect of the distal clavicle.  Impression: Right shoulder x-ray with mild to moderate AC joint degenerative changes.  New Medications:  No orders of the defined types were placed in this encounter.     Mordecai Rasmussen, MD  08/17/2021 11:20 AM

## 2021-08-22 ENCOUNTER — Ambulatory Visit (HOSPITAL_COMMUNITY): Payer: Medicare Other

## 2021-08-28 ENCOUNTER — Other Ambulatory Visit (HOSPITAL_COMMUNITY): Payer: Self-pay | Admitting: Hematology

## 2021-08-29 ENCOUNTER — Encounter (HOSPITAL_COMMUNITY): Payer: Self-pay | Admitting: Hematology

## 2021-09-10 ENCOUNTER — Other Ambulatory Visit (HOSPITAL_COMMUNITY): Payer: Self-pay

## 2021-09-10 DIAGNOSIS — C50919 Malignant neoplasm of unspecified site of unspecified female breast: Secondary | ICD-10-CM

## 2021-09-10 DIAGNOSIS — Z853 Personal history of malignant neoplasm of breast: Secondary | ICD-10-CM

## 2021-09-10 DIAGNOSIS — C50212 Malignant neoplasm of upper-inner quadrant of left female breast: Secondary | ICD-10-CM

## 2021-09-10 DIAGNOSIS — Z170 Estrogen receptor positive status [ER+]: Secondary | ICD-10-CM

## 2021-09-11 ENCOUNTER — Inpatient Hospital Stay (HOSPITAL_COMMUNITY): Payer: Medicare Other | Attending: Hematology

## 2021-09-11 ENCOUNTER — Other Ambulatory Visit: Payer: Self-pay

## 2021-09-11 ENCOUNTER — Inpatient Hospital Stay (HOSPITAL_COMMUNITY): Payer: Medicare Other

## 2021-09-11 VITALS — BP 126/90 | HR 79 | Temp 97.2°F | Resp 18

## 2021-09-11 DIAGNOSIS — R110 Nausea: Secondary | ICD-10-CM | POA: Diagnosis not present

## 2021-09-11 DIAGNOSIS — C7951 Secondary malignant neoplasm of bone: Secondary | ICD-10-CM | POA: Diagnosis not present

## 2021-09-11 DIAGNOSIS — F32A Depression, unspecified: Secondary | ICD-10-CM | POA: Insufficient documentation

## 2021-09-11 DIAGNOSIS — I2510 Atherosclerotic heart disease of native coronary artery without angina pectoris: Secondary | ICD-10-CM | POA: Diagnosis not present

## 2021-09-11 DIAGNOSIS — R197 Diarrhea, unspecified: Secondary | ICD-10-CM | POA: Diagnosis not present

## 2021-09-11 DIAGNOSIS — Z87891 Personal history of nicotine dependence: Secondary | ICD-10-CM | POA: Insufficient documentation

## 2021-09-11 DIAGNOSIS — Z79811 Long term (current) use of aromatase inhibitors: Secondary | ICD-10-CM | POA: Insufficient documentation

## 2021-09-11 DIAGNOSIS — F419 Anxiety disorder, unspecified: Secondary | ICD-10-CM | POA: Insufficient documentation

## 2021-09-11 DIAGNOSIS — Z853 Personal history of malignant neoplasm of breast: Secondary | ICD-10-CM

## 2021-09-11 DIAGNOSIS — Z7902 Long term (current) use of antithrombotics/antiplatelets: Secondary | ICD-10-CM | POA: Diagnosis not present

## 2021-09-11 DIAGNOSIS — C50212 Malignant neoplasm of upper-inner quadrant of left female breast: Secondary | ICD-10-CM | POA: Diagnosis not present

## 2021-09-11 DIAGNOSIS — Z923 Personal history of irradiation: Secondary | ICD-10-CM | POA: Insufficient documentation

## 2021-09-11 DIAGNOSIS — Z79899 Other long term (current) drug therapy: Secondary | ICD-10-CM | POA: Insufficient documentation

## 2021-09-11 DIAGNOSIS — Z170 Estrogen receptor positive status [ER+]: Secondary | ICD-10-CM | POA: Insufficient documentation

## 2021-09-11 DIAGNOSIS — M25551 Pain in right hip: Secondary | ICD-10-CM | POA: Insufficient documentation

## 2021-09-11 DIAGNOSIS — M25511 Pain in right shoulder: Secondary | ICD-10-CM | POA: Diagnosis not present

## 2021-09-11 DIAGNOSIS — C50919 Malignant neoplasm of unspecified site of unspecified female breast: Secondary | ICD-10-CM

## 2021-09-11 LAB — COMPREHENSIVE METABOLIC PANEL
ALT: 10 U/L (ref 0–44)
AST: 18 U/L (ref 15–41)
Albumin: 4.1 g/dL (ref 3.5–5.0)
Alkaline Phosphatase: 33 U/L — ABNORMAL LOW (ref 38–126)
Anion gap: 9 (ref 5–15)
BUN: 20 mg/dL (ref 8–23)
CO2: 25 mmol/L (ref 22–32)
Calcium: 8.7 mg/dL — ABNORMAL LOW (ref 8.9–10.3)
Chloride: 103 mmol/L (ref 98–111)
Creatinine, Ser: 1.07 mg/dL — ABNORMAL HIGH (ref 0.44–1.00)
GFR, Estimated: 51 mL/min — ABNORMAL LOW (ref >60)
Glucose, Bld: 89 mg/dL (ref 70–99)
Potassium: 3.9 mmol/L (ref 3.5–5.1)
Sodium: 137 mmol/L (ref 135–145)
Total Bilirubin: 0.7 mg/dL (ref 0.3–1.2)
Total Protein: 7.4 g/dL (ref 6.5–8.1)

## 2021-09-11 MED ORDER — DENOSUMAB 120 MG/1.7ML ~~LOC~~ SOLN
120.0000 mg | Freq: Once | SUBCUTANEOUS | Status: AC
  Administered 2021-09-11: 120 mg via SUBCUTANEOUS
  Filled 2021-09-11: qty 1.7

## 2021-09-11 NOTE — Patient Instructions (Signed)
[facility]  Discharge Instructions: Thank you for choosing [facility] to provide your oncology and hematology care.  If you have a lab appointment with the Cancer Center, please come in thru the Main Entrance and check in at the main information desk.  Wear comfortable clothing and clothing appropriate for easy access to any Portacath or PICC line.   We strive to give you quality time with your provider. You may need to reschedule your appointment if you arrive late (15 or more minutes).  Arriving late affects you and other patients whose appointments are after yours.  Also, if you miss three or more appointments without notifying the office, you may be dismissed from the clinic at the provider's discretion.      For prescription refill requests, have your pharmacy contact our office and allow 72 hours for refills to be completed.    Today you received the following chemotherapy and/or immunotherapy agents Xgeva      To help prevent nausea and vomiting after your treatment, we encourage you to take your nausea medication as directed.  BELOW ARE SYMPTOMS THAT SHOULD BE REPORTED IMMEDIATELY: *FEVER GREATER THAN 100.4 F (38 C) OR HIGHER *CHILLS OR SWEATING *NAUSEA AND VOMITING THAT IS NOT CONTROLLED WITH YOUR NAUSEA MEDICATION *UNUSUAL SHORTNESS OF BREATH *UNUSUAL BRUISING OR BLEEDING *URINARY PROBLEMS (pain or burning when urinating, or frequent urination) *BOWEL PROBLEMS (unusual diarrhea, constipation, pain near the anus) TENDERNESS IN MOUTH AND THROAT WITH OR WITHOUT PRESENCE OF ULCERS (sore throat, sores in mouth, or a toothache) UNUSUAL RASH, SWELLING OR PAIN  UNUSUAL VAGINAL DISCHARGE OR ITCHING   Items with * indicate a potential emergency and should be followed up as soon as possible or go to the Emergency Department if any problems should occur.  Please show the CHEMOTHERAPY ALERT CARD or IMMUNOTHERAPY ALERT CARD at check-in to the Emergency Department  and triage nurse.  Should you have questions after your visit or need to cancel or reschedule your appointment, please contact  CANC[facility] CENTER [phone]  and follow the prompts.  Office hours are 8:00 a.m. to 4:30 p.m. Monday - Friday. Please note that voicemails left after 4:00 p.m. may not be returned until the following business day.  We are closed weekends and major holidays. You have access to a nurse at all times for urgent questions. Please call the main number to the clinic [phone] and follow the prompts.  For any non-urgent questions, you may also contact your provider using MyChart. We now offer e-Visits for anyone 18 and older to request care online for non-urgent symptoms. For details visit [email or web address].   Also download the MyChart app! Go to the app store, search "MyChart", open the app, select [facility], and log in with your MyChart username and password.  Due to Covid, a mask is required upon entering the hospital/clinic. If you do not have a mask, one will be given to you upon arrival. For doctor visits, patients may have 1 support person aged 18 or older with them. For treatment visits, patients cannot have anyone with them due to current Covid guidelines and our immunocompromised population.  

## 2021-09-11 NOTE — Progress Notes (Signed)
Kara Woods presents today for Xgeva injection per the provider's orders.  Stable during administration without incident; injection site WNL; see MAR for injection details.  Patient tolerated procedure well and without incident.  No questions or complaints noted at this time.  Discharge from clinic ambulatory in stable condition.  Alert and oriented X 3.  Follow up with [facility] as scheduled.

## 2021-09-17 ENCOUNTER — Other Ambulatory Visit: Payer: Self-pay

## 2021-09-17 ENCOUNTER — Encounter: Payer: Self-pay | Admitting: Cardiovascular Disease

## 2021-09-17 ENCOUNTER — Ambulatory Visit (INDEPENDENT_AMBULATORY_CARE_PROVIDER_SITE_OTHER): Payer: Medicare Other | Admitting: Cardiovascular Disease

## 2021-09-17 VITALS — BP 102/60 | HR 62 | Ht 64.0 in | Wt 151.0 lb

## 2021-09-17 DIAGNOSIS — I2510 Atherosclerotic heart disease of native coronary artery without angina pectoris: Secondary | ICD-10-CM | POA: Diagnosis not present

## 2021-09-17 DIAGNOSIS — E785 Hyperlipidemia, unspecified: Secondary | ICD-10-CM

## 2021-09-17 DIAGNOSIS — R0602 Shortness of breath: Secondary | ICD-10-CM | POA: Diagnosis not present

## 2021-09-17 DIAGNOSIS — I4729 Other ventricular tachycardia: Secondary | ICD-10-CM | POA: Diagnosis not present

## 2021-09-17 DIAGNOSIS — I951 Orthostatic hypotension: Secondary | ICD-10-CM

## 2021-09-17 DIAGNOSIS — R42 Dizziness and giddiness: Secondary | ICD-10-CM

## 2021-09-17 DIAGNOSIS — C50919 Malignant neoplasm of unspecified site of unspecified female breast: Secondary | ICD-10-CM | POA: Diagnosis not present

## 2021-09-17 DIAGNOSIS — Z9861 Coronary angioplasty status: Secondary | ICD-10-CM

## 2021-09-17 DIAGNOSIS — I10 Essential (primary) hypertension: Secondary | ICD-10-CM

## 2021-09-17 MED ORDER — FLUDROCORTISONE ACETATE 0.1 MG PO TABS: 0.1000 mg | ORAL_TABLET | Freq: Every morning | ORAL | 3 refills | Status: DC

## 2021-09-17 MED ORDER — ASPIRIN 81 MG PO TBEC: 81.0000 mg | DELAYED_RELEASE_TABLET | Freq: Every day | ORAL | 2 refills | Status: AC

## 2021-09-17 MED ORDER — METOPROLOL TARTRATE 25 MG PO TABS: 12.5000 mg | ORAL_TABLET | Freq: Every day | ORAL | 3 refills | Status: DC

## 2021-09-17 NOTE — Patient Instructions (Signed)
Medication Instructions:  STOP Plavix Take metoprolol 12.5 mg at bedtime START Florinef 0.1 mg daily (In the morning)  *If you need a refill on your cardiac medications before your next appointment, please call your pharmacy*  Testing/Procedures: Your physician has requested that you have an echocardiogram. Echocardiography is a painless test that uses sound waves to create images of your heart. It provides your doctor with information about the size and shape of your heart and how well your heart's chambers and valves are working. This procedure takes approximately one hour. There are no restrictions for this procedure. This will be done at our [facility] location:  [place]: At [facility], you and your health needs are our priority.  As part of our continuing mission to provide you with exceptional heart care, we have created designated Provider Care Teams.  These Care Teams include your primary Cardiologist (physician) and Advanced Practice Providers (APPs -  Physician Assistants and Nurse Practitioners) who all work together to provide you with the care you need, when you need it.  We recommend signing up for the patient portal called "MyChart".  Sign up information is provided on this After Visit Summary.  MyChart is used to connect with patients for Virtual Visits (Telemedicine).  Patients are able to view lab/test results, encounter notes, upcoming appointments, etc.  Non-urgent messages can be sent to your provider as well.   To learn more about what you can do with MyChart, go to [email or web address].    Your next appointment:   2-3 month(s)  The format for your next appointment:   In Person  Provider:   Shelva Majestic, MD   Other Instructions Compression stockings recommended-20-30 mmHg

## 2021-09-17 NOTE — Progress Notes (Signed)
Cardiology Office Note    Date:  09/19/2021   ID:  Kara Woods 1932/11/29, MRN [id number]  PCP:  Kara Squibb, MD  Referring M.D.: Dr. Velvet Woods Cardiologist:  Kara Majestic, MD    History of Present Illness:  Kara Woods is a 87 y.o. female who presents for a 12 month follow-up cardiology evaluation   Kara Woods is a mother of my patient, who had suffered an out of hospital cardiac arrest years ago.  Kara Woods has a history of prior cardiac catheterization and from records Kara Woods had undergone cardiac catheterization in 1998.  The patient states her coronaries were clean.  I do not have specifics of her catheterization findings.  Over the past 6 months, she has developed progressive decline in ability to be active and has noticed significant increasing exertionally precipitated shortness of breath.  She also has had some episodes of associated chest pressure with left arm radiation.  She had undergone an echo Doppler study on 10/24/2016 which showed an EF of 50% and suggested diffuse hypokinesis.  There was grade 1 diastolic dysfunction.  There was mild MR and mild LA dilatation.  She also had undergone pulmonary function testing was not significantly abnormal.  Her past history is notable for left breast CA and she is status post prior lumpectomy and radiation treatment.  She was cared for by Kara Woods.  She also is status post hysterectomy a remote history of cholecystectomy.  She has a history of retinal detachment.  When I saw her, due to progressive symptomatology with decline in exercise tolerance and increasing exertional dyspnea with a very strong family history of CAD I recommended definitive cardiac catheterization.  This was performed by me on 11/04/2016 and revealed low normal global LV function with an EF of 50-55% with mild mid inferior focal hypocontractility.  There was multivessel CAD with multiple stenoses of 40-50% mid to distal LAD.  She had a normal  ramus intermediate vessel.  There was 80% focal stenosis in the left circumflex and 95% eccentric mid RCA stenosis followed by 50% narrowing.  She underwent initial PCI to the mid RCA with insertion of a 2.2512 mm Resolute Onyx stent postdilated to 2.5 mm with the 95% stenoses being reduced to 0%.  The following day, on 11/05/2016 she underwent staged intervention to the left circumflex flex artery with the 50 and 85% stenoses being reduced to 0% with insertion of a 2.7523 mm  Xience Alpine DES stent postdilated to 3.0.  Since her intervention, she has felt significantly improved.  She had mild dyspnea more attributed to air hunger and this typically is relieved when she takes a deep sigh.   She was started on Lipitor but did not tolerate this.  Subsequently, I recommended initiation of Crestor at 5 mg 2 times a week.  She took 2 doses and then felt she had similar symptoms and stopped taking this.  She denies any exertional chest tightness.  She was diagnosed with a left knee Baker's cyst.  She denies palpitations.    She underwent a one-year follow-up nuclear stress test on 10/09/2017 which remained low risk and showed an EF of 51% without scar or ischemia.    She was  evaluated at [facility] emergency room when she was brought there by family members with complaints of increasing shortness of breath andvague nonexertional chest tightness.  There was concern of whether or not Kara Woods could responsible.  Laboratory was negative.  She was not  found to have evidence for pneumonia or CHF and had a negative d-dimer.  She denies any episodes of chest pain since her evaluation.  She continues to experience exertional dyspnea.  He has been on aspirin and Kara Woods for antiplatelet therapy, Zetia 10 mg for hyperlipidemia although when previously I suggested a trial of Livalo which she never pursued.    An echo Doppler study on Apr 03, 2018  showed an EF of 50% with grade 1 diastolic dysfunction.  There was aortic  sclerosis without stenosis, mild MR, and probable lipomatous hypertrophy of her atrial septum.  Although I again at that time had recommended institution of lipid-lowering therapy added to her Zetia with Crestor 5 mg weekly she never pursued this.  Her LV diastolic dysfunction and some shortness of breath I added spironolactone 12.5 mg.  The past several months she has felt improved.  She denies chest pain PND orthopnea.  She never started the rosuvastatin but was uncertain why. She denies bleeding.   I saw her in August 2019; she was doing  well and has been without recurrent chest pain she denies palpitations.  Apparently for some reason she had discontinued Zetia but had tolerated this well.  She has not been able to tolerate any statins.  We had even tried to initiate Livalo but she never started this.  She believes she is sleeping well but she wakes up at least 3 times per night to night.  She was unaware of snoring or gasping for breath.  During that evaluation I recommended that she resume Zetia 10 mg daily.  She will stop follow-up laboratory with Dr. Nevada Woods.  I saw her in February 2021 at that time she was only intermittently taking Zetia, perhaps 1 time per week. Laboratory in July 2020 showed an LDL cholesterol of 130.  She just recently underwent laboratory by Dr. Nevada Woods in January 2021.  At that time, total cholesterol was 198, triglycerides 164 and HDL 44 but LDL was unknown.  I last saw her in August 2021 at which time she reported falling 1 month previously and had discomfort all over. She had anterior chest wall discomfort which was nonexertional which was felt most likely to be of musculoskeletal etiology. She was at that time back on Zetia 10 mg daily we will continue to be on DAPT with aspirin/Plavix and was on metoprolol tartrate 12.5 mg twice a day.  She presented to the emergency room with garbled speech on July 24, 2020 was felt to have had a possible TIA. An MRI revealed small chronic  cerebellar infarcts..  Subsequently, she has been diagnosed with stage IV metastatic breast cancer. Remotely she had undergone left lumpectomy in June 2014 and received radiation therapy. On recent reevaluation by Dr. Delton Coombes in September 2021,  PET imaging revealed  widespread skeletal metastases as well has lesions with soft tissue components at T1 and the sacrum. There also was mediastinal adenopathy. She has continued to experience rib pain bilaterally. She has been started on anastrozole and Ibrance therapy.  I last saw her on September 19, 2020.  She had recently worn a cardiac monitor which showed predominant sinus rhythm with an average rate at 75 bpm, maximum rate with sinus tachycardia at 145 bpm and slowest rate at sinus bradycardia 58 bpm. She had one episode of nonsustained V. tach lasting 7 beats at a rate of 133 bpm. She denied recurrent palpitations.  She had recently been started on ibrance and anastrozole by Dr. Delton Coombes with plans  for follow-up PET CT imaging.  Presently, she denies any recent chest pain.  She does experience some episodes of shortness of breath.  At times she does experience some mild lightheadedness.  He denies any syncope or awareness of recent palpitations.  She continues to be on Plavix and aspirin.  She has been taking metoprolol tartrate instead of 12.5 mg twice a day she has only been taking this once a day.  She continues to be on Ibrance and anastrozole for her metastatic breast cancer.  She presents for evaluation.  Past Medical History:  Diagnosis Date   Anxiety    Arthritis    Blind left eye    Breast cancer ([place])    left    Colitis    Coronary artery disease    a. 10/2016 Staged PCI: RCA 50p, 59m(2.25x12 Resolute Onyx DES), LCX 50p, 850m2.75x23 Xience Alpine DES). Residual LAD 50p/m, 4583m   Cystocele 10/11/2013   Depression    Diastolic dysfunction    a. 09/2016 Echo: EF 50%, diffuse hypokinesis, mild LVH, grade 1 diastolic dysfunction,  mild mitral regurgitation, mildly dilated left atrium.   GERD (gastroesophageal reflux disease)    occasional Tums only   HOH (hard of hearing)    Pelvic relaxation 10/11/2013   Proctitis    colonsocopy 2007, Canasa suppositories   S/P endoscopy March 2009   mild erosive reflux esophagitis, Schatzki's ring, s/p dilation   Schatzki's ring    Shortness of breath    occ if anxiety   Wears dentures    upper   Wears glasses     Past Surgical History:  Procedure Laterality Date   ABDOMINAL HYSTERECTOMY     ANTERIOR AND POSTERIOR REPAIR N/A 05/17/2014   Procedure: ANTERIOR (CYSTOCELE) AND POSTERIOR REPAIR (RECTOCELE);  Surgeon: ScoReece PackerD;  [facility];  Service: Urology;  Laterality: N/A;   APPENDECTOMY     BREAST LUMPECTOMY WITH NEEDLE LOCALIZATION AND AXILLARY SENTINEL LYMPH NODE BX Left 05/03/2013   Procedure: BREAST LUMPECTOMY WITH NEEDLE LOCALIZATION AND AXILLARY SENTINEL LYMPH NODE BX;  Surgeon: BenEdward JollyD;  [facility]  Laterality: Left;   BREAST SURGERY Left 05/19/13   CARDIAC CATHETERIZATION  1998   CARDIAC CATHETERIZATION N/A 11/04/2016   Procedure: Left Heart Cath and Coronary Angiography;  Surgeon: ThoTroy SineD;  Location[facility];  Service: Cardiovascular;  Laterality: N/A;   CARDIAC CATHETERIZATION N/A 11/05/2016   Procedure: Coronary Stent Intervention;  Surgeon: ThoTroy SineD;  Location: [facility];  Service: Cardiovascular;  Laterality: N/A;   CHOLECYSTECTOMY     COLONOSCOPY  05/06/2006   Diffuse inflammatory changes of the rectal mucosa, consistent  with proctitis.  Otherwise, normal colon to terminal ileum   CORONARY ANGIOPLASTY  11/04/2016   CORONARY STENT PLACEMENT     Drug-eluting coronary artery stent, non-bioabsorbable-polymer-coated   CYSTOSCOPY N/A 05/17/2014   Procedure: CYSTOSCOPY;  Surgeon: ScoReece PackerD;  [facility];  Service: Urology;   Laterality: N/A;   ESOPHAGOGASTRODUODENOSCOPY  02/09/2008   Schatzki ring status post dilation/Distal esophageal erosion consistent with mild erosive reflux  esophagitis, otherwise unremarkable esophagus, normal stomach, D1, D2.   EYE SURGERY     lt cataract-implant   EYE SURGERY     lt-lens repaced,l   LUMBAR DISC SURGERY  1993   RE-EXCISION OF BREAST LUMPECTOMY Left 05/19/2013   Procedure: RE-EXCISION OF BREAST LUMPECTOMY;  Surgeon: BenEdward Jolly  MD;  Location: [place];  Service: General;  Laterality: Left;   RETINAL DETACHMENT SURGERY  1978   Left   VAGINAL HYSTERECTOMY Bilateral 05/17/2014   Procedure: HYSTERECTOMY VAGINAL with Bilateral Salpingo-Oophorectomy; Bladder cystotomy repair;  Surgeon: Marvene Staff, MD;  Location: [facility];  Service: Gynecology;  Laterality: Bilateral;   VAGINAL PROLAPSE REPAIR N/A 05/17/2014   Procedure: VAGINAL VAULT PROLAPSE AND GRAFT;  Surgeon: Reece Packer, MD;  Location: [facility];  Service: Urology;  Laterality: N/A;    Current Medications: Outpatient Medications Prior to Visit  Medication Sig Dispense Refill   acetaminophen (TYLENOL) 325 MG tablet Take 2 tablets (650 mg total) by mouth every 6 (six) hours as needed for mild pain or headache (or temp > 37.5 C (99.5 F)). 40 tablet 0   anastrozole (ARIMIDEX) 1 MG tablet TAKE 1 TABLET BY MOUTH EVERY DAY 90 tablet 2   carboxymethylcellulose (REFRESH PLUS) 0.5 % SOLN Place 3-4 drops into both eyes 3 (three) times daily as needed (dry eyes). Preservative free     clorazepate (TRANXENE) 15 MG tablet Take 1 tablet (15 mg total) by mouth daily. 90 tablet 0   escitalopram (LEXAPRO) 20 MG tablet TAKE 1 TABLET BY MOUTH EVERY DAY 90 tablet 2   HYDROcodone-acetaminophen (NORCO) 5-325 MG tablet Take 1 tablet by mouth every 4 (four) hours as needed for moderate pain. 180 tablet 0   latanoprost (XALATAN) 0.005 % ophthalmic solution Place 1 drop into both eyes at bedtime.     palbociclib  (IBRANCE) 75 MG tablet Take 1 tablet (75 mg total) by mouth daily. Take for 21 days on, 7 days off, repeat every 28 days. 21 tablet 6   pantoprazole (PROTONIX) 40 MG tablet Take 1 tablet (40 mg total) by mouth daily before breakfast. 30 tablet 1   aspirin EC 81 MG EC tablet Take 2 tablets (162 mg total) by mouth daily. Swallow whole. 60 tablet 2   clopidogrel (PLAVIX) 75 MG tablet Take 75 mg by mouth daily.      metoprolol tartrate (LOPRESSOR) 25 MG tablet Take 0.5 tablets (12.5 mg total) by mouth 2 (two) times daily. 90 tablet 2   ezetimibe (ZETIA) 10 MG tablet TAKE 1 TABLET BY MOUTH EVERY DAY (Patient not taking: Reported on 09/17/2021) 90 tablet 0   gabapentin (NEURONTIN) 100 MG capsule TAKE 1 CAPSULE BY MOUTH THREE TIMES A DAY (Patient not taking: Reported on 09/17/2021) 90 capsule 2   nitroGLYCERIN (NITROSTAT) 0.4 MG SL tablet Place 1 tablet (0.4 mg total) under the tongue every 5 (five) minutes as needed. 25 tablet 3   ondansetron (ZOFRAN) 8 MG tablet Take 1 tablet (8 mg total) by mouth every 8 (eight) hours as needed for nausea or vomiting. (Patient not taking: Reported on 09/17/2021) 20 tablet 0   No facility-administered medications prior to visit.     Allergies:   Contrast media [iodinated diagnostic agents], Lipitor [atorvastatin], Sulfamethoxazole, and Sulfonamide derivatives   Social History   Socioeconomic History   Marital status: Widowed    Spouse name: Not on file   Number of children: 6   Years of education: Not on file   Highest education level: Not on file  Occupational History   Occupation: Retired  Tobacco Use   Smoking status: Former    Packs/day: 1.00    Types: Cigarettes    Quit date: 04/28/1990    Years since quitting: 31.4   Smokeless tobacco: Never   Tobacco comments:  quit 28 yrs ago  Substance and Sexual Activity   Alcohol use: Yes    Alcohol/week: 1.0 standard drink    Types: 1 Glasses of wine per week    Comment: occ   Drug use: No   Sexual  activity: Not Currently    Birth control/protection: Post-menopausal  Other Topics Concern   Not on file  Social History Narrative   Not on file   Social Determinants of Health   Financial Resource Strain: Low Risk    Difficulty of Paying Living Expenses: Not hard at all  Food Insecurity: No Food Insecurity   Worried About Charity fundraiser in the Last Year: Never true   Ran Out of Food in the Last Year: Never true  Transportation Needs: No Transportation Needs   Lack of Transportation (Medical): No   Lack of Transportation (Non-Medical): No  Physical Activity: Insufficiently Active   Days of Exercise per Week: 2 days   Minutes of Exercise per Session: 10 min  Stress: No Stress Concern Present   Feeling of Stress : Only a little  Social Connections: Socially Isolated   Frequency of Communication with Friends and Family: More than three times a week   Frequency of Social Gatherings with Friends and Family: More than three times a week   Attends Religious Services: Never   Marine scientist or Organizations: No   Attends Archivist Meetings: Never   Marital Status: Widowed     Family History:  The patient's family history includes Anemia in her daughter; Cancer in her brother; Depression in her son; Heart attack in her father, mother, son, and son; Multiple myeloma in her daughter.  I mother died and had a myocardial infarction.  Her father also had suffered a myocardial infarction.  She had 3 sisters who also had myocardial infarction and also one of her brothers also had suffered a myocardial infarction.  ROS General: Negative; No fevers, chills, or night sweats;  HEENT: History of retinal detachment, sinus congestion, difficulty swallowing Pulmonary: Negative; No cough, wheezing, shortness of breath, hemoptysis Cardiovascular: see HPI GI: Positive for occasional loose stools GU: Negative; No dysuria, hematuria, or difficulty voiding Musculoskeletal: Positive  for left Baker's cyst, chest wall discomfort with bone mets Hematologic/Oncology: Stage IV metastatic breast CA Endocrine: Negative; no heat/cold intolerance; no diabetes Neuro: TIA Skin: Negative; No rashes or skin lesions Psychiatric: Negative; No behavioral problems, depression Sleep: Negative; No snoring, daytime sleepiness, hypersomnolence, bruxism, restless legs, hypnogognic hallucinations, no cataplexy Other comprehensive 14 point system review is negative.   PHYSICAL EXAM:   VS:  BP 102/60 (BP Location: Left Arm, Patient Position: Sitting, Cuff Size: Normal)   Pulse 62   Ht 5' 4"  (1.626 m)   Wt 151 lb (68.5 kg)   SpO2 94%   BMI 25.92 kg/m     Repeat blood pressure by me was 110/64 supine, 100/64 sitting, 84/58 standing  Wt Readings from Last 3 Encounters:  09/17/21 151 lb (68.5 kg)  08/17/21 155 lb (70.3 kg)  08/14/21 153 lb 14.1 oz (69.8 kg)    General: Alert, oriented, no distress.  Skin: normal turgor, no rashes, warm and dry HEENT: Normocephalic, atraumatic. Pupils equal round and reactive to light; sclera anicteric; extraocular muscles intact;  Nose without nasal septal hypertrophy Mouth/Parynx benign; Mallinpatti scale 2 Neck: No JVD, no carotid bruits; normal carotid upstroke Lungs: clear to ausculatation and percussion; no wheezing or rales Chest wall: without tenderness to palpitation Heart: PMI not displaced,  RRR, s1 s2 normal, 1/6 systolic murmur, no diastolic murmur, no rubs, gallops, thrills, or heaves Abdomen: soft, nontender; no hepatosplenomehaly, BS+; abdominal aorta nontender and not dilated by palpation. Back: no CVA tenderness Pulses 2+ Musculoskeletal: full range of motion, normal strength, no joint deformities Extremities: no clubbing cyanosis or edema, Homan's sign negative  Neurologic: grossly nonfocal; Cranial nerves grossly wnl Psychologic: Normal mood and affect   Studies/Labs Reviewed:   September 17, 2021 ECG (independently read by me):  NSR at 62, no ectopy  September 19, 2020 ECG (independently read by me): Normal sinus rhythm at 64 bpm.  No ectopy.  Normal intervals  July 04, 2020 ECG (independently read by me): Normal sinus rhythm at 68 bpm.  No ectopy.  QTc interval 469 ms.  No ST segment changes  February 2020 ECG (independently read by me): Minus rhythm at 73 bpm with sinus arrhythmia.  QTc interval 458 ms.  PR interval 152 ms.  No significant ST-T change.  August 2019 ECG (independently read by me): Sinus rhythm at 73 with sinus arrhythmia.  Normal intervals.  May 2019 ECG (independently read by me): Normal sinus rhythm at 66 bpm.  No ectopy.  No ST segment changes.  December 2018 ECG (independently read by me): Normal sinus rhythm at 77 bpm.  Nonspecific ST changes.  June 2018 ECG (independently read by me): Sinus bradycardia 56 bpm.  Normal intervals.  No significant ST-T changes.  March 2018 ECG (independently read by me): Sinus bradycardia 54 bpm.  Normal intervals.  No ST segment changes.  December 2017 ECG (independently read by me): Normal sinus rhythm at 74 bpm.  No ectopy.  QTc interval 455 ms.  Recent Labs: BMP Latest Ref Rng & Units 09/11/2021 08/14/2021 06/18/2021  Glucose 70 - 99 mg/dL 89 102(H) 86  BUN 8 - 23 mg/dL 20 18 15   Creatinine 0.44 - 1.00 mg/dL 1.07(H) 0.93 0.95  Sodium 135 - 145 mmol/L 137 136 135  Potassium 3.5 - 5.1 mmol/L 3.9 4.4 4.2  Chloride 98 - 111 mmol/L 103 104 103  CO2 22 - 32 mmol/L 25 28 26   Calcium 8.9 - 10.3 mg/dL 8.7(L) 8.3(L) 8.6(L)     Hepatic Function Latest Ref Rng & Units 09/11/2021 08/14/2021 06/18/2021  Total Protein 6.5 - 8.1 g/dL 7.4 7.1 7.0  Albumin 3.5 - 5.0 g/dL 4.1 3.9 3.9  AST 15 - 41 U/L 18 14(L) 16  ALT 0 - 44 U/L 10 8 9   Alk Phosphatase 38 - 126 U/L 33(L) 42 39  Total Bilirubin 0.3 - 1.2 mg/dL 0.7 0.8 0.7  Bilirubin, Direct 0.1 - 0.5 mg/dL - - -    CBC Latest Ref Rng & Units 08/14/2021 06/18/2021 05/21/2021  WBC 4.0 - 10.5 K/uL 3.3(L) 3.9(L) 4.0   Hemoglobin 12.0 - 15.0 g/dL 11.5(L) 11.8(L) 12.1  Hematocrit 36.0 - 46.0 % 34.0(L) 36.2 37.5  Platelets 150 - 400 K/uL 138(L) 173 167   Lab Results  Component Value Date   MCV 110.0 (H) 08/14/2021   MCV 106.8 (H) 06/18/2021   MCV 107.4 (H) 05/21/2021   Lab Results  Component Value Date   TSH 1.339 10/19/2020   Lab Results  Component Value Date   HGBA1C 5.4 10/20/2020     BNP    Component Value Date/Time   BNP 51.0 09/01/2016 1012    ProBNP No results found for: PROBNP   Lipid Panel     Component Value Date/Time   CHOL 162 10/20/2020 0726   TRIG  97 10/20/2020 0726   HDL 46 10/20/2020 0726   CHOLHDL 3.5 10/20/2020 0726   VLDL 19 10/20/2020 0726   LDLCALC 97 10/20/2020 0726     RADIOLOGY: No results found.    Additional studies/ records that were reviewed today include:   At her last office visit I reviewed the office records of Kara Woods.  I reviewed her echo Doppler study and pulmonary function tests. I reviewed a  recent head CT  ASSESSMENT:    1. CAD S/P percutaneous coronary angioplasty   2. Dyslipidemia, goal LDL below 70   3. Shortness of breath   4. Orthostatic hypotension   5. Metastatic breast cancer ([place])   6. History of NSVT (nonsustained ventricular tachycardia)   7. Dizziness     PLAN:  Kara Woods is an 87 year old Caucasian female who had developed progressive decline in exercise tolerance with significant increasing shortness of breath with minimal activity associated with chest pressure and left arm radiation.  In 1998, a cardiac catheterization had shown normal coronary arteries.  An echo Doppler study from November 2017 showed an EF of 50% with diffuse hypokinesis.  There was mild MR and mild dilatation of her left atrium. Her pulmonary function studies did not identified significant pulmonary disability in the etiology of her dyspnea.  With her very strong family history of myocardial infarctions in multiple family members,  as well as remote tobacco history and progressive changes in symptomatology she underwent repeat cardiac catheterization on November 04, 2016.  This revealed severe multivessel CAD as noted above and she underwent successful stenting of her RCA as well as her left circumflex vessels in a staged procedure.  When I saw her, she had noticed some subsequent shortness of breath which improved with initiation of Spironolactone. She has a history of statin intolerance and is on Zetia 10 mg daily. She has not had any anginal symptoms. She developed TIA symptoms leading to ER evaluation in August 2021 and CT demonstrated chronic cerebellar infarct. She was referred for a cardiac monitor which showed predominant sinus rhythm. There was no evidence for atrial fibrillation; however she did have a run of nonsustained ventricular tachycardia at a rate of 133 bpm. She has a remote history of breast CA dating back to 2014 and unfortunately developed recurrence with stage IV metastatic disease with metastases to her skeletal bones, ribs, as well as spine. She has been started on Ibrance and anastrozole by Dr. Delton Coombes.  Presently, she admits to some shortness of breath without chest pain.  Her blood pressure today is low and there is a mild orthostatic drop going from supine to standing.  Presently, I am recommending she undergo a follow-up echo Doppler study for reassessment of LV systolic and diastolic function.  Since she is only taking metoprolol tartrate 12.5 mg daily I have suggested that she take this at bedtime rather than in the morning.  I have recommended support stockings with 20 to 30 mm of pressure support with her orthostasis and some dizziness.  I have suggested the addition of low-dose Florinef at 0.1 mg to take in the morning.  Due to potential fall risk I have recommended she discontinue Plavix.  We will continue with aspirin.  I will contact her regarding her echo results and plan to see her in follow-up in 2  to 3 months for reevaluation.   Medication Adjustments/Labs and Tests Ordered: Current medicines are reviewed at length with the patient today.  Concerns regarding medicines are outlined above.  Medication changes, Labs and Tests ordered today are listed in the Patient Instructions below.  Patient Instructions  Medication Instructions:  STOP Plavix Take metoprolol 12.5 mg at bedtime START Florinef 0.1 mg daily (In the morning)  *If you need a refill on your cardiac medications before your next appointment, please call your pharmacy*  Testing/Procedures: Your physician has requested that you have an echocardiogram. Echocardiography is a painless test that uses sound waves to create images of your heart. It provides your doctor with information about the size and shape of your heart and how well your heart's chambers and valves are working. This procedure takes approximately one hour. There are no restrictions for this procedure. This will be done at our [facility] location:  [place]: At [employer], you and your health needs are our priority.  As part of our continuing mission to provide you with exceptional heart care, we have created designated Provider Care Teams.  These Care Teams include your primary Cardiologist (physician) and Advanced Practice Providers (APPs -  Physician Assistants and Nurse Practitioners) who all work together to provide you with the care you need, when you need it.  We recommend signing up for the patient portal called "MyChart".  Sign up information is provided on this After Visit Summary.  MyChart is used to connect with patients for Virtual Visits (Telemedicine).  Patients are able to view lab/test results, encounter notes, upcoming appointments, etc.  Non-urgent messages can be sent to your provider as well.   To learn more about what you can do with MyChart, go to [email or web address].    Your next appointment:   2-3  month(s)  The format for your next appointment:   In Person  Provider:   Shelva Majestic, MD   Other Instructions Compression stockings recommended-20-30 mmHg   Signed, Kara Majestic, MD, [facility] 09/19/2021 5:32 PM    [place] [address] [place], [place], [place]  [address] Phone: [phone]

## 2021-09-18 ENCOUNTER — Other Ambulatory Visit (HOSPITAL_COMMUNITY): Payer: Self-pay

## 2021-09-19 ENCOUNTER — Encounter: Payer: Self-pay | Admitting: Cardiovascular Disease

## 2021-09-25 DIAGNOSIS — E663 Overweight: Secondary | ICD-10-CM | POA: Insufficient documentation

## 2021-09-25 DIAGNOSIS — I2510 Atherosclerotic heart disease of native coronary artery without angina pectoris: Secondary | ICD-10-CM | POA: Insufficient documentation

## 2021-09-25 DIAGNOSIS — F411 Generalized anxiety disorder: Secondary | ICD-10-CM | POA: Insufficient documentation

## 2021-09-26 DIAGNOSIS — I2510 Atherosclerotic heart disease of native coronary artery without angina pectoris: Secondary | ICD-10-CM | POA: Diagnosis not present

## 2021-09-26 DIAGNOSIS — Z23 Encounter for immunization: Secondary | ICD-10-CM | POA: Diagnosis not present

## 2021-09-26 DIAGNOSIS — C7951 Secondary malignant neoplasm of bone: Secondary | ICD-10-CM | POA: Insufficient documentation

## 2021-09-26 DIAGNOSIS — R7301 Impaired fasting glucose: Secondary | ICD-10-CM | POA: Diagnosis not present

## 2021-09-26 DIAGNOSIS — E782 Mixed hyperlipidemia: Secondary | ICD-10-CM | POA: Diagnosis not present

## 2021-09-26 DIAGNOSIS — F411 Generalized anxiety disorder: Secondary | ICD-10-CM | POA: Diagnosis not present

## 2021-09-26 DIAGNOSIS — I10 Essential (primary) hypertension: Secondary | ICD-10-CM | POA: Diagnosis not present

## 2021-09-26 DIAGNOSIS — E663 Overweight: Secondary | ICD-10-CM | POA: Diagnosis not present

## 2021-10-01 ENCOUNTER — Telehealth: Payer: Self-pay | Admitting: Cardiovascular Disease

## 2021-10-01 NOTE — Telephone Encounter (Signed)
Pt c/o medication issue:  1. Name of Medication: fludrocortisone (FLORINEF) 0.1 MG tablet  2. How are you currently taking this medication (dosage and times per day)? Take 1 tablet (0.1 mg total) by mouth in the morning.  3. Are you having a reaction (difficulty breathing--STAT)? no  4. What is your medication issue? Patient daughter called stating after 2-3 days taking this medication her mother started to have hallucinations. Daughter wants to know if this could be a side effect of this medication.  It has gotten worse over time.

## 2021-10-01 NOTE — Telephone Encounter (Signed)
Spoke with daughter.  Will have them hold fludrocortisone for now.  They do not have a home BP cuff, so advised daughter to watch for any dizziness/vertigo or issues with stability and positional changes.  Explained that it could be 3-4 days for medication to work out of her system and the hallucination to stop.   Daughter voiced understanding.

## 2021-10-02 ENCOUNTER — Telehealth (HOSPITAL_COMMUNITY): Payer: Self-pay | Admitting: Pharmacy Technician

## 2021-10-02 NOTE — Telephone Encounter (Signed)
Oral Oncology Patient Advocate Encounter   Was successful in securing patient an $47 grant from Patient [facility]) to provide copayment coverage for Ibrance.  This will keep the out of pocket expense at $0.     I have spoken with the patient.    The billing information is as follows and has been shared with [facility].   Member ID: [id number] Group ID: [id number] RxBin: [id number] Dates of Eligibility: 07/04/21 through 10/01/22  Fund:  [employer]  [employer] Patient [place] Phone [phone] Fax [phone] 10/02/2021 12:46 PM

## 2021-10-03 ENCOUNTER — Telehealth (HOSPITAL_COMMUNITY): Payer: Self-pay | Admitting: Pharmacy Technician

## 2021-10-08 ENCOUNTER — Other Ambulatory Visit (HOSPITAL_COMMUNITY): Payer: Self-pay | Admitting: *Deleted

## 2021-10-08 DIAGNOSIS — R978 Other abnormal tumor markers: Secondary | ICD-10-CM

## 2021-10-08 DIAGNOSIS — Z170 Estrogen receptor positive status [ER+]: Secondary | ICD-10-CM

## 2021-10-08 DIAGNOSIS — C50212 Malignant neoplasm of upper-inner quadrant of left female breast: Secondary | ICD-10-CM

## 2021-10-08 NOTE — Progress Notes (Signed)
[facility] [address], [place] [address]   Patient Care Team: Celene Squibb, MD as PCP - General (Internal Medicine) Troy Sine, MD as PCP - Cardiology (Cardiology) Gala Romney Cristopher Estimable, MD (Gastroenterology) Brien Mates, RN as Oncology Nurse Navigator (Oncology)  SUMMARY OF ONCOLOGIC HISTORY: Oncology History   No history exists.    CHIEF COMPLIANT: Follow-up of left breast cancer   INTERVAL HISTORY: Kara Woods is a 87 y.o. female here today for follow up of her left breast cancer. Her last visit was on 08/14/2021.   Today she reports feeling good, and she is accompanied by her daughter. She reports occasional pain in her left axilla which has been present since September 2022; this pain is not worsened with inspiration or movement. She is taking anastrozole and tolerating it well. She has stopped taking fludrocortisone following experiencing hallucinations after starting the medication. She denies nausea, but she reports intermittent constipation and diarrhea for which she is taking Imodium.   REVIEW OF SYSTEMS:   Review of Systems  Constitutional:  Negative for appetite change and fatigue (75%).  Respiratory:  Positive for cough and shortness of breath.   Cardiovascular:  Positive for chest pain (8/10 L axilla).  Gastrointestinal:  Positive for constipation and diarrhea. Negative for nausea.  Neurological:  Positive for dizziness.  All other systems reviewed and are negative.  I have reviewed the past medical history, past surgical history, social history and family history with the patient and they are unchanged from previous note.   ALLERGIES:   is allergic to contrast media [iodinated diagnostic agents], lipitor [atorvastatin], sulfamethoxazole, and sulfonamide derivatives.   MEDICATIONS:  Current Outpatient Medications  Medication Sig Dispense Refill   acetaminophen (TYLENOL) 325 MG tablet Take 2 tablets (650 mg total) by mouth  every 6 (six) hours as needed for mild pain or headache (or temp > 37.5 C (99.5 F)). 40 tablet 0   anastrozole (ARIMIDEX) 1 MG tablet TAKE 1 TABLET BY MOUTH EVERY DAY 90 tablet 2   aspirin 81 MG EC tablet Take 1 tablet (81 mg total) by mouth daily. Swallow whole. 60 tablet 2   carboxymethylcellulose (REFRESH PLUS) 0.5 % SOLN Place 3-4 drops into both eyes 3 (three) times daily as needed (dry eyes). Preservative free     clorazepate (TRANXENE) 15 MG tablet Take 1 tablet (15 mg total) by mouth daily. 90 tablet 0   escitalopram (LEXAPRO) 20 MG tablet TAKE 1 TABLET BY MOUTH EVERY DAY 90 tablet 2   ezetimibe (ZETIA) 10 MG tablet TAKE 1 TABLET BY MOUTH EVERY DAY (Patient not taking: Reported on 09/17/2021) 90 tablet 0   fludrocortisone (FLORINEF) 0.1 MG tablet Take 1 tablet (0.1 mg total) by mouth in the morning. 90 tablet 3   gabapentin (NEURONTIN) 100 MG capsule TAKE 1 CAPSULE BY MOUTH THREE TIMES A DAY (Patient not taking: Reported on 09/17/2021) 90 capsule 2   HYDROcodone-acetaminophen (NORCO) 5-325 MG tablet Take 1 tablet by mouth every 4 (four) hours as needed for moderate pain. 180 tablet 0   latanoprost (XALATAN) 0.005 % ophthalmic solution Place 1 drop into both eyes at bedtime.     metoprolol tartrate (LOPRESSOR) 25 MG tablet Take 0.5 tablets (12.5 mg total) by mouth at bedtime. 90 tablet 3   nitroGLYCERIN (NITROSTAT) 0.4 MG SL tablet Place 1 tablet (0.4 mg total) under the tongue every 5 (five) minutes as needed. 25 tablet 3   ondansetron (ZOFRAN) 8 MG tablet  Take 1 tablet (8 mg total) by mouth every 8 (eight) hours as needed for nausea or vomiting. (Patient not taking: Reported on 09/17/2021) 20 tablet 0   palbociclib (IBRANCE) 75 MG tablet Take 1 tablet (75 mg total) by mouth daily. Take for 21 days on, 7 days off, repeat every 28 days. 21 tablet 6   pantoprazole (PROTONIX) 40 MG tablet Take 1 tablet (40 mg total) by mouth daily before breakfast. 30 tablet 1   No current facility-administered  medications for this visit.     PHYSICAL EXAMINATION: Performance status (ECOG): 1 - Symptomatic but completely ambulatory  There were no vitals filed for this visit. Wt Readings from Last 3 Encounters:  09/17/21 151 lb (68.5 kg)  08/17/21 155 lb (70.3 kg)  08/14/21 153 lb 14.1 oz (69.8 kg)   Physical Exam Vitals reviewed.  Constitutional:      Appearance: Normal appearance.  Cardiovascular:     Rate and Rhythm: Normal rate and regular rhythm.     Pulses: Normal pulses.     Heart sounds: Normal heart sounds.  Pulmonary:     Effort: Pulmonary effort is normal.     Breath sounds: Normal breath sounds.  Neurological:     General: No focal deficit present.     Mental Status: She is alert and oriented to person, place, and time.  Psychiatric:        Mood and Affect: Mood normal.        Behavior: Behavior normal.    Breast Exam Chaperone: Thana Ates     LABORATORY DATA:  I have reviewed the data as listed CMP Latest Ref Rng & Units 09/11/2021 08/14/2021 06/18/2021  Glucose 70 - 99 mg/dL 89 102(H) 86  BUN 8 - 23 mg/dL 20 18 15   Creatinine 0.44 - 1.00 mg/dL 1.07(H) 0.93 0.95  Sodium 135 - 145 mmol/L 137 136 135  Potassium 3.5 - 5.1 mmol/L 3.9 4.4 4.2  Chloride 98 - 111 mmol/L 103 104 103  CO2 22 - 32 mmol/L 25 28 26   Calcium 8.9 - 10.3 mg/dL 8.7(L) 8.3(L) 8.6(L)  Total Protein 6.5 - 8.1 g/dL 7.4 7.1 7.0  Total Bilirubin 0.3 - 1.2 mg/dL 0.7 0.8 0.7  Alkaline Phos 38 - 126 U/L 33(L) 42 39  AST 15 - 41 U/L 18 14(L) 16  ALT 0 - 44 U/L 10 8 9    Lab Results  Component Value Date   CAN153 27.0 (H) 08/14/2021   CAN153 28.4 (H) 06/18/2021   CAN153 26.7 (H) 05/21/2021   Lab Results  Component Value Date   WBC 3.3 (L) 08/14/2021   HGB 11.5 (L) 08/14/2021   HCT 34.0 (L) 08/14/2021   MCV 110.0 (H) 08/14/2021   PLT 138 (L) 08/14/2021   NEUTROABS 1.6 (L) 08/14/2021    ASSESSMENT:  1.  T2N0 left breast infiltrating lobular carcinoma: -Status post lumpectomy in June  2014, 1.4 cm, grade 1, lymphovascular invasion positive, ER 100%, PR 26%, Ki-67 18%. -She underwent XRT.  She did not take adjuvant endocrine therapy. -Mammogram on 07/13/2020, BI-RADS Category 1. -CEA was 10.3, Ca1 2515.8 and CA 19-9 05. -PET scan on 08/07/2020 shows widespread hypermetabolic bone meta stasis with soft tissue components at T1 and sacrum.  Thoracic nodal metastasis. -MRI of the thoracic spine on 08/15/2020 shows pathological fracture of T1 with mild osseous retropulsion and possible left C8 nerve root encroachment within the foramen at C7-T1.  No cord deformity. -Right third rib biopsy on 08/22/2020 consistent with metastatic breast  cancer, ER 100% positive, PR 40% positive, Ki-67 10%, HER-2 2+ and negative by FISH. -Ibrance 100 mg 3 weeks on 1 week off on anastrozole 1 mg daily started on 08/31/2020. -Ibrance held on 10/13/2020 secondary to severe tiredness. - PET scan on 02/19/2021 shows improvement in metastatic disease.   2.  Social/family history: -She quit smoking 20 years ago, smoked 2 packs/day for more than 20 years prior to quitting. -She lives at home by herself and is independent of all activities. -Daughter who has multiple myeloma at age 19.   PLAN:  1.  Metastatic cancer to the bones, highly likely breast cancer: - Continue anastrozole.  No major problems reported. - Continue Ibrance 75 mg 3 weeks on/1 week off. - PET scan on 08/09/2021 showed multiple lytic lesions throughout the skeleton without metabolic activity consistent with treatment changes.  No evidence of visceral metastasis. - She reported pain in the left sided ribs in the axillary region for the past few weeks.  Does not report any trauma.  There is a tender point.  We will keep a close eye on it. - Reviewed labs from today which showed normal LFTs.  White count is slightly low at 3.6 with normal ANC.  Last tumor markers were within normal limits. - Recommend follow-up in 2 months with repeat scan and  labs.   2.  CAD and RCA stenting: - Continue metoprolol and aspirin.  Plavix was stopped. - She reportedly had hallucinations with the Florinef which was discontinued.   3.  Right shoulder pain: - She was evaluated by orthopedics and had an injection which helped.  She will follow-up with orthopedics to see if she can get another injection.   4.  Bone metastasis: - She is tolerating denosumab very well.  Calcium today is 8.6 with albumin 3.9.  Continue denosumab monthly.   5.  Depression/anxiety: - Continue Lexapro daily.   6.  Diarrhea: - She has occasional diarrhea from [place].  She takes 1 Imodium as needed.  She gets constipated after that.  Recommend cutting back to half tablet of Imodium as needed.  Breast Cancer therapy associated bone loss: I have recommended calcium, Vitamin D and weight bearing exercises.  Orders placed this encounter:  No orders of the defined types were placed in this encounter.   The patient has a good understanding of the overall plan. She agrees with it. She will call with any problems that may develop before the next visit here.  Derek Jack, MD [place] [phone]   I, Thana Ates, am acting as a scribe for Dr. Derek Jack.  I, Derek Jack MD, have reviewed the above documentation for accuracy and completeness, and I agree with the above.

## 2021-10-09 ENCOUNTER — Other Ambulatory Visit: Payer: Self-pay

## 2021-10-09 ENCOUNTER — Inpatient Hospital Stay (HOSPITAL_BASED_OUTPATIENT_CLINIC_OR_DEPARTMENT_OTHER): Payer: Medicare Other | Admitting: Hematology

## 2021-10-09 ENCOUNTER — Inpatient Hospital Stay (HOSPITAL_COMMUNITY): Payer: Medicare Other | Attending: Hematology

## 2021-10-09 ENCOUNTER — Inpatient Hospital Stay (HOSPITAL_COMMUNITY): Payer: Medicare Other

## 2021-10-09 VITALS — BP 136/83 | HR 68 | Temp 97.8°F | Resp 16 | Wt 151.0 lb

## 2021-10-09 DIAGNOSIS — C7951 Secondary malignant neoplasm of bone: Secondary | ICD-10-CM | POA: Insufficient documentation

## 2021-10-09 DIAGNOSIS — F32A Depression, unspecified: Secondary | ICD-10-CM | POA: Insufficient documentation

## 2021-10-09 DIAGNOSIS — R978 Other abnormal tumor markers: Secondary | ICD-10-CM | POA: Insufficient documentation

## 2021-10-09 DIAGNOSIS — R197 Diarrhea, unspecified: Secondary | ICD-10-CM | POA: Insufficient documentation

## 2021-10-09 DIAGNOSIS — F419 Anxiety disorder, unspecified: Secondary | ICD-10-CM | POA: Insufficient documentation

## 2021-10-09 DIAGNOSIS — I2510 Atherosclerotic heart disease of native coronary artery without angina pectoris: Secondary | ICD-10-CM | POA: Diagnosis not present

## 2021-10-09 DIAGNOSIS — C50212 Malignant neoplasm of upper-inner quadrant of left female breast: Secondary | ICD-10-CM | POA: Insufficient documentation

## 2021-10-09 DIAGNOSIS — Z79899 Other long term (current) drug therapy: Secondary | ICD-10-CM | POA: Diagnosis not present

## 2021-10-09 DIAGNOSIS — Z170 Estrogen receptor positive status [ER+]: Secondary | ICD-10-CM | POA: Insufficient documentation

## 2021-10-09 DIAGNOSIS — Z923 Personal history of irradiation: Secondary | ICD-10-CM | POA: Diagnosis not present

## 2021-10-09 DIAGNOSIS — Z853 Personal history of malignant neoplasm of breast: Secondary | ICD-10-CM

## 2021-10-09 DIAGNOSIS — C50919 Malignant neoplasm of unspecified site of unspecified female breast: Secondary | ICD-10-CM | POA: Diagnosis not present

## 2021-10-09 DIAGNOSIS — Z7902 Long term (current) use of antithrombotics/antiplatelets: Secondary | ICD-10-CM | POA: Diagnosis not present

## 2021-10-09 DIAGNOSIS — M25551 Pain in right hip: Secondary | ICD-10-CM | POA: Insufficient documentation

## 2021-10-09 DIAGNOSIS — Z7982 Long term (current) use of aspirin: Secondary | ICD-10-CM | POA: Insufficient documentation

## 2021-10-09 DIAGNOSIS — Z87891 Personal history of nicotine dependence: Secondary | ICD-10-CM | POA: Diagnosis not present

## 2021-10-09 DIAGNOSIS — K5900 Constipation, unspecified: Secondary | ICD-10-CM | POA: Insufficient documentation

## 2021-10-09 DIAGNOSIS — M25511 Pain in right shoulder: Secondary | ICD-10-CM | POA: Diagnosis not present

## 2021-10-09 DIAGNOSIS — Z79811 Long term (current) use of aromatase inhibitors: Secondary | ICD-10-CM | POA: Insufficient documentation

## 2021-10-09 LAB — CBC WITH DIFFERENTIAL/PLATELET
Abs Immature Granulocytes: 0.03 10*3/uL (ref 0.00–0.07)
Basophils Absolute: 0.1 10*3/uL (ref 0.0–0.1)
Basophils Relative: 1 %
Eosinophils Absolute: 0.1 10*3/uL (ref 0.0–0.5)
Eosinophils Relative: 3 %
HCT: 35.1 % — ABNORMAL LOW (ref 36.0–46.0)
Hemoglobin: 11.6 g/dL — ABNORMAL LOW (ref 12.0–15.0)
Immature Granulocytes: 1 %
Lymphocytes Relative: 42 %
Lymphs Abs: 1.5 10*3/uL (ref 0.7–4.0)
MCH: 36.3 pg — ABNORMAL HIGH (ref 26.0–34.0)
MCHC: 33 g/dL (ref 30.0–36.0)
MCV: 109.7 fL — ABNORMAL HIGH (ref 80.0–100.0)
Monocytes Absolute: 0.2 10*3/uL (ref 0.1–1.0)
Monocytes Relative: 6 %
Neutro Abs: 1.7 10*3/uL (ref 1.7–7.7)
Neutrophils Relative %: 47 %
Platelets: 168 10*3/uL (ref 150–400)
RBC: 3.2 MIL/uL — ABNORMAL LOW (ref 3.87–5.11)
RDW: 13.9 % (ref 11.5–15.5)
WBC: 3.6 10*3/uL — ABNORMAL LOW (ref 4.0–10.5)
nRBC: 0 % (ref 0.0–0.2)

## 2021-10-09 LAB — COMPREHENSIVE METABOLIC PANEL
ALT: 10 U/L (ref 0–44)
AST: 19 U/L (ref 15–41)
Albumin: 3.9 g/dL (ref 3.5–5.0)
Alkaline Phosphatase: 37 U/L — ABNORMAL LOW (ref 38–126)
Anion gap: 7 (ref 5–15)
BUN: 14 mg/dL (ref 8–23)
CO2: 23 mmol/L (ref 22–32)
Calcium: 8.6 mg/dL — ABNORMAL LOW (ref 8.9–10.3)
Chloride: 105 mmol/L (ref 98–111)
Creatinine, Ser: 0.75 mg/dL (ref 0.44–1.00)
GFR, Estimated: >60 mL/min (ref >60)
Glucose, Bld: 100 mg/dL — ABNORMAL HIGH (ref 70–99)
Potassium: 4.8 mmol/L (ref 3.5–5.1)
Sodium: 135 mmol/L (ref 135–145)
Total Bilirubin: 0.9 mg/dL (ref 0.3–1.2)
Total Protein: 7.1 g/dL (ref 6.5–8.1)

## 2021-10-09 MED ORDER — DENOSUMAB 120 MG/1.7ML ~~LOC~~ SOLN
120.0000 mg | Freq: Once | SUBCUTANEOUS | Status: AC
  Administered 2021-10-09: 120 mg via SUBCUTANEOUS
  Filled 2021-10-09: qty 1.7

## 2021-10-09 NOTE — Progress Notes (Signed)
Patient is taking Ibrance and Arimidex as prescribed.  She has not missed any doses and reports no side effects at this time.

## 2021-10-09 NOTE — Progress Notes (Signed)
Patient is taking Ibrance as prescribed.  She has not missed any doses and reports no side effects at this time.   

## 2021-10-09 NOTE — Patient Instructions (Addendum)
[place] at [facility] Discharge Instructions  You were seen and examined today by Dr. Delton Coombes. He reviewed your lab work, which was normal/stable. Continue Ibrance as prescribed.  You will receive Xgeva (the shot for your bones) today and every 4 weeks. Return as scheduled for scans, lab work, and office visit.    Thank you for choosing [place] at [facility] to provide your oncology and hematology care.  To afford each patient quality time with our provider, please arrive at least 15 minutes before your scheduled appointment time.   If you have a lab appointment with the [facility] please come in thru the Main Entrance and check in at the main information desk.  You need to re-schedule your appointment should you arrive 10 or more minutes late.  We strive to give you quality time with our providers, and arriving late affects you and other patients whose appointments are after yours.  Also, if you no show three or more times for appointments you may be dismissed from the clinic at the providers discretion.     Again, thank you for choosing [facility].  Our hope is that these requests will decrease the amount of time that you wait before being seen by our physicians.       _____________________________________________________________  Should you have questions after your visit to [facility], please contact our office at [phone] and follow the prompts.  Our office hours are 8:00 a.m. and 4:30 p.m. Monday - Friday.  Please note that voicemails left after 4:00 p.m. may not be returned until the following business day.  We are closed weekends and major holidays.  You do have access to a nurse 24-7, just call the main number to the clinic [phone] and do not press any options, hold on the line and a nurse will answer the phone.    For prescription refill requests, have your pharmacy contact our office and  allow 72 hours.    Due to Covid, you will need to wear a mask upon entering the hospital. If you do not have a mask, a mask will be given to you at the Main Entrance upon arrival. For doctor visits, patients may have 1 support person age 63 or older with them. For treatment visits, patients can not have anyone with them due to social distancing guidelines and our immunocompromised population.

## 2021-10-09 NOTE — Progress Notes (Signed)
Pt here for monthly x-geva.  Last dose given 09/11/21.  Calcium 8.6.  Okay for treatment today per Dr Raliegh Ip.  Kara Woods presents today for injection per the provider's orders.  X-geva given in abdomen subcutaneous administration without incident; injection site WNL; see MAR for injection details.  Patient tolerated procedure well and without incident.  No questions or complaints noted at this time. Stable during and after injection.  AVS reviewed.  Vital signs stable.  Discharged in stable condition ambulatory.

## 2021-10-09 NOTE — Patient Instructions (Signed)
[place]  Discharge Instructions: Thank you for choosing [facility] to provide your oncology and hematology care.  If you have a lab appointment with the [facility], please come in thru the Main Entrance and check in at the main information desk.  Wear comfortable clothing and clothing appropriate for easy access to any Portacath or PICC line.   We strive to give you quality time with your provider. You may need to reschedule your appointment if you arrive late (15 or more minutes).  Arriving late affects you and other patients whose appointments are after yours.  Also, if you miss three or more appointments without notifying the office, you may be dismissed from the clinic at the provider's discretion.      For prescription refill requests, have your pharmacy contact our office and allow 72 hours for refills to be completed.    Today you received the following chemotherapy and/or immunotherapy agents x-geva, calcium 8.6.  explained to pt to take calcium with vitamin d as well, follow up as scheduled.   To help prevent nausea and vomiting after your treatment, we encourage you to take your nausea medication as directed.  BELOW ARE SYMPTOMS THAT SHOULD BE REPORTED IMMEDIATELY: *FEVER GREATER THAN 100.4 F (38 C) OR HIGHER *CHILLS OR SWEATING *NAUSEA AND VOMITING THAT IS NOT CONTROLLED WITH YOUR NAUSEA MEDICATION *UNUSUAL SHORTNESS OF BREATH *UNUSUAL BRUISING OR BLEEDING *URINARY PROBLEMS (pain or burning when urinating, or frequent urination) *BOWEL PROBLEMS (unusual diarrhea, constipation, pain near the anus) TENDERNESS IN MOUTH AND THROAT WITH OR WITHOUT PRESENCE OF ULCERS (sore throat, sores in mouth, or a toothache) UNUSUAL RASH, SWELLING OR PAIN  UNUSUAL VAGINAL DISCHARGE OR ITCHING   Items with * indicate a potential emergency and should be followed up as soon as possible or go to the Emergency Department if any problems should occur.  Please show  the CHEMOTHERAPY ALERT CARD or IMMUNOTHERAPY ALERT CARD at check-in to the Emergency Department and triage nurse.  Should you have questions after your visit or need to cancel or reschedule your appointment, please contact [facility] [phone]  and follow the prompts.  Office hours are 8:00 a.m. to 4:30 p.m. Monday - Friday. Please note that voicemails left after 4:00 p.m. may not be returned until the following business day.  We are closed weekends and major holidays. You have access to a nurse at all times for urgent questions. Please call the main number to the clinic [phone] and follow the prompts.  For any non-urgent questions, you may also contact your provider using MyChart. We now offer e-Visits for anyone 65 and older to request care online for non-urgent symptoms. For details visit [email or web address].   Also download the MyChart app! Go to the app store, search "MyChart", open the app, select [facility], and log in with your MyChart username and password.  Due to Covid, a mask is required upon entering the hospital/clinic. If you do not have a mask, one will be given to you upon arrival. For doctor visits, patients may have 1 support person aged 12 or older with them. For treatment visits, patients cannot have anyone with them due to current Covid guidelines and our immunocompromised population.  Denosumab injection What is this medication? DENOSUMAB (den oh sue mab) slows bone breakdown. Prolia is used to treat osteoporosis in women after menopause and in men, and in people who are taking corticosteroids for 6 months or more. Delton See is used to treat  a high calcium level due to cancer and to prevent bone fractures and other bone problems caused by multiple myeloma or cancer bone metastases. Delton See is also used to treat giant cell tumor of the bone. This medicine may be used for other purposes; ask your health care provider or pharmacist if you have questions. COMMON  BRAND NAME(S): Prolia, XGEVA What should I tell my care team before I take this medication? They need to know if you have any of these conditions: dental disease having surgery or tooth extraction infection kidney disease low levels of calcium or Vitamin D in the blood malnutrition on hemodialysis skin conditions or sensitivity thyroid or parathyroid disease an unusual reaction to denosumab, other medicines, foods, dyes, or preservatives pregnant or trying to get pregnant breast-feeding How should I use this medication? This medicine is for injection under the skin. It is given by a health care professional in a hospital or clinic setting. A special MedGuide will be given to you before each treatment. Be sure to read this information carefully each time. For Prolia, talk to your pediatrician regarding the use of this medicine in children. Special care may be needed. For Delton See, talk to your pediatrician regarding the use of this medicine in children. While this drug may be prescribed for children as young as 13 years for selected conditions, precautions do apply. Overdosage: If you think you have taken too much of this medicine contact a poison control center or emergency room at once. NOTE: This medicine is only for you. Do not share this medicine with others. What if I miss a dose? It is important not to miss your dose. Call your doctor or health care professional if you are unable to keep an appointment. What may interact with this medication? Do not take this medicine with any of the following medications: other medicines containing denosumab This medicine may also interact with the following medications: medicines that lower your chance of fighting infection steroid medicines like prednisone or cortisone This list may not describe all possible interactions. Give your health care provider a list of all the medicines, herbs, non-prescription drugs, or dietary supplements you use. Also  tell them if you smoke, drink alcohol, or use illegal drugs. Some items may interact with your medicine. What should I watch for while using this medication? Visit your doctor or health care professional for regular checks on your progress. Your doctor or health care professional may order blood tests and other tests to see how you are doing. Call your doctor or health care professional for advice if you get a fever, chills or sore throat, or other symptoms of a cold or flu. Do not treat yourself. This drug may decrease your body's ability to fight infection. Try to avoid being around people who are sick. You should make sure you get enough calcium and vitamin D while you are taking this medicine, unless your doctor tells you not to. Discuss the foods you eat and the vitamins you take with your health care professional. See your dentist regularly. Brush and floss your teeth as directed. Before you have any dental work done, tell your dentist you are receiving this medicine. Do not become pregnant while taking this medicine or for 5 months after stopping it. Talk with your doctor or health care professional about your birth control options while taking this medicine. Women should inform their doctor if they wish to become pregnant or think they might be pregnant. There is a potential for serious side  effects to an unborn child. Talk to your health care professional or pharmacist for more information. What side effects may I notice from receiving this medication? Side effects that you should report to your doctor or health care professional as soon as possible: allergic reactions like skin rash, itching or hives, swelling of the face, lips, or tongue bone pain breathing problems dizziness jaw pain, especially after dental work redness, blistering, peeling of the skin signs and symptoms of infection like fever or chills; cough; sore throat; pain or trouble passing urine signs of low calcium like fast  heartbeat, muscle cramps or muscle pain; pain, tingling, numbness in the hands or feet; seizures unusual bleeding or bruising unusually weak or tired Side effects that usually do not require medical attention (report to your doctor or health care professional if they continue or are bothersome): constipation diarrhea headache joint pain loss of appetite muscle pain runny nose tiredness upset stomach This list may not describe all possible side effects. Call your doctor for medical advice about side effects. You may report side effects to FDA at 1-800-FDA-1088. Where should I keep my medication? This medicine is only given in a clinic, doctor's office, or other health care setting and will not be stored at home. NOTE: This sheet is a summary. It may not cover all possible information. If you have questions about this medicine, talk to your doctor, pharmacist, or health care provider.  2022 Elsevier/Gold Standard (2018-03-20 00:00:00)

## 2021-10-10 LAB — CANCER ANTIGEN 19-9: CA 19-9: 6 U/mL (ref 0–35)

## 2021-10-10 LAB — CANCER ANTIGEN 15-3: CA 15-3: 27.2 U/mL — ABNORMAL HIGH (ref 0.0–25.0)

## 2021-10-11 ENCOUNTER — Other Ambulatory Visit: Payer: Self-pay

## 2021-10-11 ENCOUNTER — Ambulatory Visit (HOSPITAL_COMMUNITY): Payer: Medicare Other | Attending: Cardiology

## 2021-10-11 DIAGNOSIS — R0602 Shortness of breath: Secondary | ICD-10-CM | POA: Diagnosis not present

## 2021-10-11 DIAGNOSIS — Z9861 Coronary angioplasty status: Secondary | ICD-10-CM | POA: Diagnosis not present

## 2021-10-11 DIAGNOSIS — I2510 Atherosclerotic heart disease of native coronary artery without angina pectoris: Secondary | ICD-10-CM

## 2021-10-11 LAB — ECHOCARDIOGRAM COMPLETE
Area-P 1/2: 3.67 cm2
S' Lateral: 3 cm

## 2021-10-23 ENCOUNTER — Telehealth: Payer: Self-pay | Admitting: Cardiovascular Disease

## 2021-10-23 NOTE — Telephone Encounter (Signed)
The patient has been notified of the result and verbalized understanding.  All questions (if any) were answered. Raiford Simmonds, RN 10/23/2021 2:08 PM

## 2021-10-23 NOTE — Telephone Encounter (Signed)
Pt is wanting to know the results of her echocardiogram.. please advise

## 2021-10-23 NOTE — Telephone Encounter (Signed)
   Per Dr [name] ,EF 45% with global hypokinesis and grade 1 diastolic dysfunction.  Mild dilation of left atrium.  Valves normal.,

## 2021-11-01 ENCOUNTER — Other Ambulatory Visit (HOSPITAL_COMMUNITY): Payer: Self-pay

## 2021-11-01 ENCOUNTER — Encounter (HOSPITAL_COMMUNITY): Payer: Self-pay | Admitting: Hematology

## 2021-11-01 NOTE — Telephone Encounter (Signed)
Oral Oncology Patient Advocate Encounter   Was successful in securing patient a $6000 grant from [employer] to provide copayment coverage for [employer].  This will keep the out of pocket expense at $0.      The billing information is as follows and has been shared with [employer].   Member ID: [id number] Group ID: [id number] RxBin: [id number] PCN: [id number] Dates of Eligibility: 10/03/21 through 10/03/22  Fund name:  [employer] Patient [place] Phone [phone] Fax [phone] 11/01/2021 2:04 PM

## 2021-11-06 ENCOUNTER — Inpatient Hospital Stay (HOSPITAL_COMMUNITY): Payer: Medicare Other

## 2021-11-06 ENCOUNTER — Other Ambulatory Visit: Payer: Self-pay

## 2021-11-06 ENCOUNTER — Inpatient Hospital Stay (HOSPITAL_COMMUNITY): Payer: Medicare Other | Attending: Hematology

## 2021-11-06 VITALS — BP 137/68 | HR 76 | Temp 97.6°F | Resp 18

## 2021-11-06 DIAGNOSIS — K5900 Constipation, unspecified: Secondary | ICD-10-CM | POA: Diagnosis not present

## 2021-11-06 DIAGNOSIS — R978 Other abnormal tumor markers: Secondary | ICD-10-CM | POA: Diagnosis not present

## 2021-11-06 DIAGNOSIS — Z170 Estrogen receptor positive status [ER+]: Secondary | ICD-10-CM | POA: Insufficient documentation

## 2021-11-06 DIAGNOSIS — Z79811 Long term (current) use of aromatase inhibitors: Secondary | ICD-10-CM | POA: Insufficient documentation

## 2021-11-06 DIAGNOSIS — Z923 Personal history of irradiation: Secondary | ICD-10-CM | POA: Diagnosis not present

## 2021-11-06 DIAGNOSIS — I2510 Atherosclerotic heart disease of native coronary artery without angina pectoris: Secondary | ICD-10-CM | POA: Diagnosis not present

## 2021-11-06 DIAGNOSIS — Z7902 Long term (current) use of antithrombotics/antiplatelets: Secondary | ICD-10-CM | POA: Insufficient documentation

## 2021-11-06 DIAGNOSIS — C50919 Malignant neoplasm of unspecified site of unspecified female breast: Secondary | ICD-10-CM

## 2021-11-06 DIAGNOSIS — Z79899 Other long term (current) drug therapy: Secondary | ICD-10-CM | POA: Diagnosis not present

## 2021-11-06 DIAGNOSIS — Z87891 Personal history of nicotine dependence: Secondary | ICD-10-CM | POA: Insufficient documentation

## 2021-11-06 DIAGNOSIS — M25511 Pain in right shoulder: Secondary | ICD-10-CM | POA: Diagnosis not present

## 2021-11-06 DIAGNOSIS — F419 Anxiety disorder, unspecified: Secondary | ICD-10-CM | POA: Insufficient documentation

## 2021-11-06 DIAGNOSIS — C50212 Malignant neoplasm of upper-inner quadrant of left female breast: Secondary | ICD-10-CM | POA: Insufficient documentation

## 2021-11-06 DIAGNOSIS — R197 Diarrhea, unspecified: Secondary | ICD-10-CM | POA: Insufficient documentation

## 2021-11-06 DIAGNOSIS — F32A Depression, unspecified: Secondary | ICD-10-CM | POA: Insufficient documentation

## 2021-11-06 DIAGNOSIS — Z7982 Long term (current) use of aspirin: Secondary | ICD-10-CM | POA: Insufficient documentation

## 2021-11-06 DIAGNOSIS — Z853 Personal history of malignant neoplasm of breast: Secondary | ICD-10-CM

## 2021-11-06 DIAGNOSIS — M25551 Pain in right hip: Secondary | ICD-10-CM | POA: Insufficient documentation

## 2021-11-06 DIAGNOSIS — C7951 Secondary malignant neoplasm of bone: Secondary | ICD-10-CM | POA: Insufficient documentation

## 2021-11-06 LAB — COMPREHENSIVE METABOLIC PANEL
ALT: 13 U/L (ref 0–44)
AST: 18 U/L (ref 15–41)
Albumin: 3.8 g/dL (ref 3.5–5.0)
Alkaline Phosphatase: 39 U/L (ref 38–126)
Anion gap: 6 (ref 5–15)
BUN: 12 mg/dL (ref 8–23)
CO2: 27 mmol/L (ref 22–32)
Calcium: 8.6 mg/dL — ABNORMAL LOW (ref 8.9–10.3)
Chloride: 104 mmol/L (ref 98–111)
Creatinine, Ser: 0.7 mg/dL (ref 0.44–1.00)
GFR, Estimated: >60 mL/min (ref >60)
Glucose, Bld: 124 mg/dL — ABNORMAL HIGH (ref 70–99)
Potassium: 3.7 mmol/L (ref 3.5–5.1)
Sodium: 137 mmol/L (ref 135–145)
Total Bilirubin: 0.6 mg/dL (ref 0.3–1.2)
Total Protein: 7.2 g/dL (ref 6.5–8.1)

## 2021-11-06 MED ORDER — DENOSUMAB 120 MG/1.7ML ~~LOC~~ SOLN
120.0000 mg | Freq: Once | SUBCUTANEOUS | Status: AC
  Administered 2021-11-06: 120 mg via SUBCUTANEOUS
  Filled 2021-11-06: qty 1.7

## 2021-11-06 NOTE — Patient Instructions (Signed)
[facility]  Discharge Instructions: Thank you for choosing [facility] to provide your oncology and hematology care.  If you have a lab appointment with the Cancer Center, please come in thru the Main Entrance and check in at the main information desk.  Wear comfortable clothing and clothing appropriate for easy access to any Portacath or PICC line.   We strive to give you quality time with your provider. You may need to reschedule your appointment if you arrive late (15 or more minutes).  Arriving late affects you and other patients whose appointments are after yours.  Also, if you miss three or more appointments without notifying the office, you may be dismissed from the clinic at the provider's discretion.      For prescription refill requests, have your pharmacy contact our office and allow 72 hours for refills to be completed.    Today you received the following chemotherapy and/or immunotherapy agents Xgeva      To help prevent nausea and vomiting after your treatment, we encourage you to take your nausea medication as directed.  BELOW ARE SYMPTOMS THAT SHOULD BE REPORTED IMMEDIATELY: *FEVER GREATER THAN 100.4 F (38 C) OR HIGHER *CHILLS OR SWEATING *NAUSEA AND VOMITING THAT IS NOT CONTROLLED WITH YOUR NAUSEA MEDICATION *UNUSUAL SHORTNESS OF BREATH *UNUSUAL BRUISING OR BLEEDING *URINARY PROBLEMS (pain or burning when urinating, or frequent urination) *BOWEL PROBLEMS (unusual diarrhea, constipation, pain near the anus) TENDERNESS IN MOUTH AND THROAT WITH OR WITHOUT PRESENCE OF ULCERS (sore throat, sores in mouth, or a toothache) UNUSUAL RASH, SWELLING OR PAIN  UNUSUAL VAGINAL DISCHARGE OR ITCHING   Items with * indicate a potential emergency and should be followed up as soon as possible or go to the Emergency Department if any problems should occur.  Please show the CHEMOTHERAPY ALERT CARD or IMMUNOTHERAPY ALERT CARD at check-in to the Emergency Department  and triage nurse.  Should you have questions after your visit or need to cancel or reschedule your appointment, please contact [facility] [phone]  and follow the prompts.  Office hours are 8:00 a.m. to 4:30 p.m. Monday - Friday. Please note that voicemails left after 4:00 p.m. may not be returned until the following business day.  We are closed weekends and major holidays. You have access to a nurse at all times for urgent questions. Please call the main number to the clinic [phone] and follow the prompts.  For any non-urgent questions, you may also contact your provider using MyChart. We now offer e-Visits for anyone 18 and older to request care online for non-urgent symptoms. For details visit [email or web address].   Also download the MyChart app! Go to the app store, search "MyChart", open the app, select [facility], and log in with your MyChart username and password.  Due to Covid, a mask is required upon entering the hospital/clinic. If you do not have a mask, one will be given to you upon arrival. For doctor visits, patients may have 1 support person aged 18 or older with them. For treatment visits, patients cannot have anyone with them due to current Covid guidelines and our immunocompromised population.  

## 2021-11-06 NOTE — Progress Notes (Signed)
Kara Woods presents today for Xgeva injection per the provider's orders.  Patient has had no jaw pain or dental work, and has been taking calcium and vitamin D supplements.  Stable during administration without incident; injection site WNL; see MAR for injection details.  Patient tolerated procedure well and without incident.  No questions or complaints noted at this time. Discharge from clinic ambulatory in stable condition.  Alert and oriented X 3.  Follow up with [facility] as scheduled.

## 2021-11-21 ENCOUNTER — Other Ambulatory Visit: Payer: Self-pay

## 2021-11-21 ENCOUNTER — Ambulatory Visit
Admission: EM | Admit: 2021-11-21 | Discharge: 2021-11-21 | Disposition: A | Discharge status: Home / Self Care | Payer: Medicare Other

## 2021-11-21 NOTE — ED Triage Notes (Signed)
Pt reports weakness x 1 day. Per daughter pt fell on the restroom today , and vomited after the fall, complaining of having a headache, nausea, right arm pain.

## 2021-11-22 ENCOUNTER — Other Ambulatory Visit (HOSPITAL_COMMUNITY): Payer: Self-pay | Admitting: Oncology

## 2021-11-22 ENCOUNTER — Telehealth (HOSPITAL_COMMUNITY): Payer: Self-pay | Admitting: *Deleted

## 2021-11-22 DIAGNOSIS — C50212 Malignant neoplasm of upper-inner quadrant of left female breast: Secondary | ICD-10-CM

## 2021-11-22 DIAGNOSIS — J069 Acute upper respiratory infection, unspecified: Secondary | ICD-10-CM | POA: Diagnosis not present

## 2021-11-22 DIAGNOSIS — Z170 Estrogen receptor positive status [ER+]: Secondary | ICD-10-CM

## 2021-11-22 DIAGNOSIS — C50919 Malignant neoplasm of unspecified site of unspecified female breast: Secondary | ICD-10-CM

## 2021-11-22 DIAGNOSIS — U071 COVID-19: Secondary | ICD-10-CM | POA: Diagnosis not present

## 2021-11-22 NOTE — Telephone Encounter (Signed)
Pt's daughter called into clinic stating her mom was diagnosed with COVID and wanted to know if Dr.K would prescribe Paxlovid. Message sent to Dr.K and he recommend for pt to contact per PCP. Called pt's daughter and told her what Dr.K recommend and pt's daughter verbalized understanding.

## 2021-11-29 ENCOUNTER — Encounter (HOSPITAL_COMMUNITY): Payer: Medicare Other

## 2021-12-03 ENCOUNTER — Other Ambulatory Visit: Payer: Self-pay

## 2021-12-03 ENCOUNTER — Encounter: Payer: Self-pay | Admitting: Cardiovascular Disease

## 2021-12-03 ENCOUNTER — Ambulatory Visit (INDEPENDENT_AMBULATORY_CARE_PROVIDER_SITE_OTHER): Payer: Medicare Other | Admitting: Cardiovascular Disease

## 2021-12-03 VITALS — BP 124/62 | HR 92 | Ht 66.0 in | Wt 151.6 lb

## 2021-12-03 DIAGNOSIS — C50919 Malignant neoplasm of unspecified site of unspecified female breast: Secondary | ICD-10-CM

## 2021-12-03 DIAGNOSIS — Z9861 Coronary angioplasty status: Secondary | ICD-10-CM

## 2021-12-03 DIAGNOSIS — I4729 Other ventricular tachycardia: Secondary | ICD-10-CM

## 2021-12-03 DIAGNOSIS — I2510 Atherosclerotic heart disease of native coronary artery without angina pectoris: Secondary | ICD-10-CM | POA: Diagnosis not present

## 2021-12-03 DIAGNOSIS — E785 Hyperlipidemia, unspecified: Secondary | ICD-10-CM | POA: Diagnosis not present

## 2021-12-03 DIAGNOSIS — I10 Essential (primary) hypertension: Secondary | ICD-10-CM | POA: Diagnosis not present

## 2021-12-03 NOTE — Progress Notes (Signed)
Cardiology Office Note    Date:  12/19/2021   ID:  Kara, Woods [birth year], MRN [id number]  PCP:  Celene Squibb, MD  Referring M.D.: Dr. Velvet Bathe Cardiologist:  Shelva Majestic, MD    History of Present Illness:  Kara Woods is a 88 y.o. female who presents for a 2 month follow-up cardiology evaluation   Kara Woods is a mother of my patient, who had suffered an out of hospital cardiac arrest years ago.  Kara Woods has a history of prior cardiac catheterization and from records [name] had undergone cardiac catheterization in 1998.  The patient states her coronaries were clean.  I do not have specifics of her catheterization findings.  Over the past 6 months, she has developed progressive decline in ability to be active and has noticed significant increasing exertionally precipitated shortness of breath.  She also has had some episodes of associated chest pressure with left arm radiation.  She had undergone an echo Doppler study on 10/24/2016 which showed an EF of 50% and suggested diffuse hypokinesis.  There was grade 1 diastolic dysfunction.  There was mild MR and mild LA dilatation.  She also had undergone pulmonary function testing was not significantly abnormal.  Her past history is notable for left breast CA and she is status post prior lumpectomy and radiation treatment.  She was cared for by Dr. Excell Seltzer.  She also is status post hysterectomy a remote history of cholecystectomy.  She has a history of retinal detachment.  When I saw her, due to progressive symptomatology with decline in exercise tolerance and increasing exertional dyspnea with a very strong family history of CAD I recommended definitive cardiac catheterization.  This was performed by me on 11/04/2016 and revealed low normal global LV function with an EF of 50-55% with mild mid inferior focal hypocontractility.  There was multivessel CAD with multiple stenoses of 40-50% mid to distal LAD.  She had a normal  ramus intermediate vessel.  There was 80% focal stenosis in the left circumflex and 95% eccentric mid RCA stenosis followed by 50% narrowing.  She underwent initial PCI to the mid RCA with insertion of a 2.2512 mm Resolute Onyx stent postdilated to 2.5 mm with the 95% stenoses being reduced to 0%.  The following day, on 11/05/2016 she underwent staged intervention to the left circumflex flex artery with the 50 and 85% stenoses being reduced to 0% with insertion of a 2.7523 mm  Xience Alpine DES stent postdilated to 3.0.  Since her intervention, she has felt significantly improved.  She had mild dyspnea more attributed to air hunger and this typically is relieved when she takes a deep sigh.   She was started on Lipitor but did not tolerate this.  Subsequently, I recommended initiation of Crestor at 5 mg 2 times a week.  She took 2 doses and then felt she had similar symptoms and stopped taking this.  She denies any exertional chest tightness.  She was diagnosed with a left knee Baker's cyst.  She denies palpitations.    She underwent a one-year follow-up nuclear stress test on 10/09/2017 which remained low risk and showed an EF of 51% without scar or ischemia.    She was  evaluated at [facility] emergency room when she was brought there by family members with complaints of increasing shortness of breath andvague nonexertional chest tightness.  There was concern of whether or not Brilinta could responsible.  Laboratory was negative.  She was not  found to have evidence for pneumonia or CHF and had a negative d-dimer.  She denies any episodes of chest pain since her evaluation.  She continues to experience exertional dyspnea.  He has been on aspirin and Brilinta for antiplatelet therapy, Zetia 10 mg for hyperlipidemia although when previously I suggested a trial of Livalo which she never pursued.    An echo Doppler study on Apr 03, 2018  showed an EF of 50% with grade 1 diastolic dysfunction.  There was aortic  sclerosis without stenosis, mild MR, and probable lipomatous hypertrophy of her atrial septum.  Although I again at that time had recommended institution of lipid-lowering therapy added to her Zetia with Crestor 5 mg weekly she never pursued this.  Her LV diastolic dysfunction and some shortness of breath I added spironolactone 12.5 mg.  The past several months she has felt improved.  She denies chest pain PND orthopnea.  She never started the rosuvastatin but was uncertain why. She denies bleeding.   I saw her in August 2019; she was doing  well and has been without recurrent chest pain she denies palpitations.  Apparently for some reason she had discontinued Zetia but had tolerated this well.  She has not been able to tolerate any statins.  We had even tried to initiate Livalo but she never started this.  She believes she is sleeping well but she wakes up at least 3 times per night to night.  She was unaware of snoring or gasping for breath.  During that evaluation I recommended that she resume Zetia 10 mg daily.  She will stop follow-up laboratory with Dr. Nevada Crane.  I saw her in February 2021 at that time she was only intermittently taking Zetia, perhaps 1 time per week. Laboratory in July 2020 showed an LDL cholesterol of 130.  She just recently underwent laboratory by Dr. Nevada Crane in January 2021.  At that time, total cholesterol was 198, triglycerides 164 and HDL 44 but LDL was unknown.  I last saw her in August 2021 at which time she reported falling 1 month previously and had discomfort all over. She had anterior chest wall discomfort which was nonexertional which was felt most likely to be of musculoskeletal etiology. She was at that time back on Zetia 10 mg daily we will continue to be on DAPT with aspirin/Plavix and was on metoprolol tartrate 12.5 mg twice a day.  She presented to the emergency room with garbled speech on July 24, 2020 was felt to have had a possible TIA. An MRI revealed small chronic  cerebellar infarcts..  Subsequently, she has been diagnosed with stage IV metastatic breast cancer. Remotely she had undergone left lumpectomy in June 2014 and received radiation therapy. On recent reevaluation by Dr. Delton Coombes in September 2021,  PET imaging revealed  widespread skeletal metastases as well has lesions with soft tissue components at T1 and the sacrum. There also was mediastinal adenopathy. She has continued to experience rib pain bilaterally. She has been started on anastrozole and Ibrance therapy.  I saw her on September 19, 2020.  She had recently worn a cardiac monitor which showed predominant sinus rhythm with an average rate at 75 bpm, maximum rate with sinus tachycardia at 145 bpm and slowest rate at sinus bradycardia 58 bpm. She had one episode of nonsustained V. tach lasting 7 beats at a rate of 133 bpm. She denied recurrent palpitations.  She had recently been started on ibrance and anastrozole by Dr. Delton Coombes with plans for  follow-up PET CT imaging.  I last saw her in September 17, 2021.  At that time she denied any chest pain but was experiencing some mild shortness of breath.  Also she had experienced some mild lightheadedness but denied any syncope or awareness of palpitations.  She continued to be on Plavix and aspirin.  She has been taking metoprolol tartrate instead of 12.5 mg twice a day she has only been taking this once a day.  She continues to be on Ibrance and anastrozole for her metastatic breast cancer.  During her evaluation, her blood pressure was low and there was mild orthostatic drop going from supine to standing.  I recommended she undergo a follow-up echo Doppler study for reassessment of LV systolic and diastolic function and suggested she take her metoprolol tartrate 12.5 mg at bedtime rather than in the morning.  I also recommended support stockings with 20 to 30 mm pressure support.  I suggested the possibility of low-dose Florinef 0.1 mg in the morning.  Due  to potential fall risk I recommended she discontinue Plavix and continue aspirin.  Since I saw her he underwent her echo Doppler study on October 11, 2021 which showed an EF of 45% with global hypokinesis and grade 1 diastolic dysfunction.  There was mild dilation of her left atrium.  There was no significant valvular abnormalities.  Presently, she feels well. Her dizziness has improved.  She is unaware of palpitations.  She denies chest pain.  She continues to be on anastrozole and palbociclib for her stage IV metastatic cancer.  She will be undergoing a follow-up PET scan.  She presents for reevaluation.    Past Medical History:  Diagnosis Date   Anxiety    Arthritis    Blind left eye    Breast cancer ([place])    left    Colitis    Coronary artery disease    a. 10/2016 Staged PCI: RCA 50p, 42m(2.25x12 Resolute Onyx DES), LCX 50p, 84m2.75x23 Xience Alpine DES). Residual LAD 50p/m, 4542m   Cystocele 10/11/2013   Depression    Diastolic dysfunction    a. 09/2016 Echo: EF 50%, diffuse hypokinesis, mild LVH, grade 1 diastolic dysfunction, mild mitral regurgitation, mildly dilated left atrium.   GERD (gastroesophageal reflux disease)    occasional Tums only   HOH (hard of hearing)    Pelvic relaxation 10/11/2013   Proctitis    colonsocopy 2007, Canasa suppositories   S/P endoscopy March 2009   mild erosive reflux esophagitis, Schatzki's ring, s/p dilation   Schatzki's ring    Shortness of breath    occ if anxiety   Wears dentures    upper   Wears glasses     Past Surgical History:  Procedure Laterality Date   ABDOMINAL HYSTERECTOMY     ANTERIOR AND POSTERIOR REPAIR N/A 05/17/2014   Procedure: ANTERIOR (CYSTOCELE) AND POSTERIOR REPAIR (RECTOCELE);  Surgeon: ScoReece PackerD;  [facility];  Service: Urology;  Laterality: N/A;   APPENDECTOMY     BREAST LUMPECTOMY WITH NEEDLE LOCALIZATION AND AXILLARY SENTINEL LYMPH NODE BX Left 05/03/2013   Procedure: BREAST LUMPECTOMY  WITH NEEDLE LOCALIZATION AND AXILLARY SENTINEL LYMPH NODE BX;  Surgeon: BenEdward JollyD;  [facility];  Laterality: Left;   BREAST SURGERY Left 05/19/13   CARDIAC CATHETERIZATION  1998   CARDIAC CATHETERIZATION N/A 11/04/2016   Procedure: Left Heart Cath and Coronary Angiography;  Surgeon: ThoTroy SineD;  [facility];  Service: Cardiovascular;  Laterality: N/A;   CARDIAC CATHETERIZATION N/A 11/05/2016   Procedure: Coronary Stent Intervention;  Surgeon: Troy Sine, MD;  Location: [place] CV LAB;  Service: Cardiovascular;  Laterality: N/A;   CHOLECYSTECTOMY     COLONOSCOPY  05/06/2006   Diffuse inflammatory changes of the rectal mucosa, consistent  with proctitis.  Otherwise, normal colon to terminal ileum   CORONARY ANGIOPLASTY  11/04/2016   CORONARY STENT PLACEMENT     Drug-eluting coronary artery stent, non-bioabsorbable-polymer-coated   CYSTOSCOPY N/A 05/17/2014   Procedure: CYSTOSCOPY;  Surgeon: Reece Packer, MD;  Location: [facility];  Service: Urology;  Laterality: N/A;   ESOPHAGOGASTRODUODENOSCOPY  02/09/2008   Schatzki ring status post dilation/Distal esophageal erosion consistent with mild erosive reflux  esophagitis, otherwise unremarkable esophagus, normal stomach, D1, D2.   EYE SURGERY     lt cataract-implant   EYE SURGERY     lt-lens repaced,l   LUMBAR DISC SURGERY  1993   RE-EXCISION OF BREAST LUMPECTOMY Left 05/19/2013   Procedure: RE-EXCISION OF BREAST LUMPECTOMY;  Surgeon: Edward Jolly, MD;  Location: [place];  Service: General;  Laterality: Left;   RETINAL DETACHMENT SURGERY  1978   Left   VAGINAL HYSTERECTOMY Bilateral 05/17/2014   Procedure: HYSTERECTOMY VAGINAL with Bilateral Salpingo-Oophorectomy; Bladder cystotomy repair;  Surgeon: Marvene Staff, MD;  Location: [facility];  Service: Gynecology;  Laterality: Bilateral;   VAGINAL PROLAPSE REPAIR N/A 05/17/2014    Procedure: VAGINAL VAULT PROLAPSE AND GRAFT;  Surgeon: Reece Packer, MD;  Location: [facility];  Service: Urology;  Laterality: N/A;    Current Medications: Outpatient Medications Prior to Visit  Medication Sig Dispense Refill   acetaminophen (TYLENOL) 325 MG tablet Take 2 tablets (650 mg total) by mouth every 6 (six) hours as needed for mild pain or headache (or temp > 37.5 C (99.5 F)). 40 tablet 0   anastrozole (ARIMIDEX) 1 MG tablet TAKE 1 TABLET BY MOUTH EVERY DAY 90 tablet 2   aspirin 81 MG EC tablet Take 1 tablet (81 mg total) by mouth daily. Swallow whole. 60 tablet 2   carboxymethylcellulose (REFRESH PLUS) 0.5 % SOLN Place 3-4 drops into both eyes 3 (three) times daily as needed (dry eyes). Preservative free     clorazepate (TRANXENE) 15 MG tablet Take 1 tablet (15 mg total) by mouth daily. 90 tablet 0   escitalopram (LEXAPRO) 20 MG tablet TAKE 1 TABLET BY MOUTH EVERY DAY 90 tablet 2   HYDROcodone-acetaminophen (NORCO) 5-325 MG tablet Take 1 tablet by mouth every 4 (four) hours as needed for moderate pain. 180 tablet 0   latanoprost (XALATAN) 0.005 % ophthalmic solution Place 1 drop into both eyes at bedtime.     metoprolol tartrate (LOPRESSOR) 25 MG tablet Take 12.5 mg by mouth at bedtime.     nitroGLYCERIN (NITROSTAT) 0.4 MG SL tablet Place 1 tablet (0.4 mg total) under the tongue every 5 (five) minutes as needed. 25 tablet 3   ezetimibe (ZETIA) 10 MG tablet TAKE 1 TABLET BY MOUTH EVERY DAY 90 tablet 0   gabapentin (NEURONTIN) 100 MG capsule TAKE 1 CAPSULE BY MOUTH THREE TIMES A DAY 90 capsule 2   metoprolol tartrate (LOPRESSOR) 25 MG tablet Take 0.5 tablets (12.5 mg total) by mouth at bedtime. 90 tablet 3   ondansetron (ZOFRAN) 8 MG tablet Take 1 tablet (8 mg total) by mouth every 8 (eight) hours as needed for nausea or vomiting. 20 tablet 0  palbociclib (IBRANCE) 75 MG tablet Take 1 tablet (75 mg total) by mouth daily. Take for 21 days on, 7 days off, repeat every 28 days. 21  tablet 6   pantoprazole (PROTONIX) 40 MG tablet Take 1 tablet (40 mg total) by mouth daily before breakfast. 30 tablet 1   No facility-administered medications prior to visit.     Allergies:   Contrast media [iodinated contrast media], Lipitor [atorvastatin], Sulfamethoxazole, and Sulfonamide derivatives   Social History   Socioeconomic History   Marital status: Widowed    Spouse name: Not on file   Number of children: 6   Years of education: Not on file   Highest education level: Not on file  Occupational History   Occupation: Retired  Tobacco Use   Smoking status: Former    Packs/day: 1.00    Types: Cigarettes    Quit date: 04/28/1990    Years since quitting: 31.6   Smokeless tobacco: Never   Tobacco comments:    quit 28 yrs ago  Substance and Sexual Activity   Alcohol use: Yes    Alcohol/week: 1.0 standard drink    Types: 1 Glasses of wine per week    Comment: occ   Drug use: No   Sexual activity: Not Currently    Birth control/protection: Post-menopausal  Other Topics Concern   Not on file  Social History Narrative   Not on file   Social Determinants of Health   Financial Resource Strain: Not on file  Food Insecurity: Not on file  Transportation Needs: Not on file  Physical Activity: Not on file  Stress: Not on file  Social Connections: Not on file     Family History:  The patient's family history includes Anemia in her daughter; Cancer in her brother; Depression in her son; Heart attack in her father, mother, son, and son; Multiple myeloma in her daughter.  I mother died and had a myocardial infarction.  Her father also had suffered a myocardial infarction.  She had 3 sisters who also had myocardial infarction and also one of her brothers also had suffered a myocardial infarction.  ROS General: Negative; No fevers, chills, or night sweats;  HEENT: History of retinal detachment, sinus congestion, difficulty swallowing Pulmonary: Negative; No cough, wheezing,  shortness of breath, hemoptysis Cardiovascular: see HPI GI: Positive for occasional loose stools GU: Negative; No dysuria, hematuria, or difficulty voiding Musculoskeletal: Positive for left Baker's cyst, chest wall discomfort with bone mets Hematologic/Oncology: Stage IV metastatic breast CA Endocrine: Negative; no heat/cold intolerance; no diabetes Neuro: TIA Skin: Negative; No rashes or skin lesions Psychiatric: Negative; No behavioral problems, depression Sleep: Negative; No snoring, daytime sleepiness, hypersomnolence, bruxism, restless legs, hypnogognic hallucinations, no cataplexy Other comprehensive 14 point system review is negative.   PHYSICAL EXAM:   VS:  BP 124/62    Pulse 92    Ht 5' 6"  (1.676 m)    Wt 151 lb 9.6 oz (68.8 kg)    SpO2 95%    BMI 24.47 kg/m     Repeat blood pressure 122/70  Wt Readings from Last 3 Encounters:  12/17/21 151 lb (68.5 kg)  12/03/21 151 lb 9.6 oz (68.8 kg)  10/09/21 151 lb (68.5 kg)    General: Alert, oriented, no distress.  Skin: normal turgor, no rashes, warm and dry HEENT: Normocephalic, atraumatic. Pupils equal round and reactive to light; sclera anicteric; extraocular muscles intact;  Nose without nasal septal hypertrophy Mouth/Parynx benign; Mallinpatti scale 2 Neck: No JVD, no carotid bruits; normal  carotid upstroke Lungs: clear to ausculatation and percussion; no wheezing or rales Chest wall: without tenderness to palpitation Heart: PMI not displaced, RRR, s1 s2 normal, 1/6 systolic murmur, no diastolic murmur, no rubs, gallops, thrills, or heaves Abdomen: soft, nontender; no hepatosplenomehaly, BS+; abdominal aorta nontender and not dilated by palpation. Back: no CVA tenderness Pulses 2+ Musculoskeletal: full range of motion, normal strength, no joint deformities Extremities: no clubbing cyanosis or edema, Homan's sign negative  Neurologic: grossly nonfocal; Cranial nerves grossly wnl Psychologic: Normal mood and  affect    Studies/Labs Reviewed:   December 03, 2021 ECG (independently read by me):Sinus rhythm at 92,  with [facility]  September 17, 2021 ECG (independently read by me): NSR at 62, no ectopy  September 19, 2020 ECG (independently read by me): Normal sinus rhythm at 64 bpm.  No ectopy.  Normal intervals  July 04, 2020 ECG (independently read by me): Normal sinus rhythm at 68 bpm.  No ectopy.  QTc interval 469 ms.  No ST segment changes  February 2020 ECG (independently read by me): Minus rhythm at 73 bpm with sinus arrhythmia.  QTc interval 458 ms.  PR interval 152 ms.  No significant ST-T change.  August 2019 ECG (independently read by me): Sinus rhythm at 73 with sinus arrhythmia.  Normal intervals.  May 2019 ECG (independently read by me): Normal sinus rhythm at 66 bpm.  No ectopy.  No ST segment changes.  December 2018 ECG (independently read by me): Normal sinus rhythm at 77 bpm.  Nonspecific ST changes.  June 2018 ECG (independently read by me): Sinus bradycardia 56 bpm.  Normal intervals.  No significant ST-T changes.  March 2018 ECG (independently read by me): Sinus bradycardia 54 bpm.  Normal intervals.  No ST segment changes.  December 2017 ECG (independently read by me): Normal sinus rhythm at 74 bpm.  No ectopy.  QTc interval 455 ms.  Recent Labs: BMP Latest Ref Rng & Units 12/06/2021 11/06/2021 10/09/2021  Glucose 70 - 99 mg/dL 97 124(H) 100(H)  BUN 8 - 23 mg/dL 12 12 14   Creatinine 0.44 - 1.00 mg/dL 0.65 0.70 0.75  Sodium 135 - 145 mmol/L 140 137 135  Potassium 3.5 - 5.1 mmol/L 4.6 3.7 4.8  Chloride 98 - 111 mmol/L 104 104 105  CO2 22 - 32 mmol/L 29 27 23   Calcium 8.9 - 10.3 mg/dL 9.2 8.6(L) 8.6(L)     Hepatic Function Latest Ref Rng & Units 12/06/2021 11/06/2021 10/09/2021  Total Protein 6.5 - 8.1 g/dL 7.5 7.2 7.1  Albumin 3.5 - 5.0 g/dL 4.2 3.8 3.9  AST 15 - 41 U/L 15 18 19   ALT 0 - 44 U/L 9 13 10   Alk Phosphatase 38 - 126 U/L 35(L) 39 37(L)  Total Bilirubin 0.3 -  1.2 mg/dL 0.7 0.6 0.9  Bilirubin, Direct 0.1 - 0.5 mg/dL - - -    CBC Latest Ref Rng & Units 12/06/2021 10/09/2021 08/14/2021  WBC 4.0 - 10.5 K/uL 4.2 3.6(L) 3.3(L)  Hemoglobin 12.0 - 15.0 g/dL 12.5 11.6(L) 11.5(L)  Hematocrit 36.0 - 46.0 % 38.2 35.1(L) 34.0(L)  Platelets 150 - 400 K/uL 202 168 138(L)   Lab Results  Component Value Date   MCV 110.1 (H) 12/06/2021   MCV 109.7 (H) 10/09/2021   MCV 110.0 (H) 08/14/2021   Lab Results  Component Value Date   TSH 1.339 10/19/2020   Lab Results  Component Value Date   HGBA1C 5.4 10/20/2020     BNP  Component Value Date/Time   BNP 51.0 09/01/2016 1012    ProBNP No results found for: PROBNP   Lipid Panel     Component Value Date/Time   CHOL 162 10/20/2020 0726   TRIG 97 10/20/2020 0726   HDL 46 10/20/2020 0726   CHOLHDL 3.5 10/20/2020 0726   VLDL 19 10/20/2020 0726   LDLCALC 97 10/20/2020 0726     RADIOLOGY: NM PET Image Restag (PS) Skull Base To Thigh  Result Date: 12/07/2021 CLINICAL DATA:  Subsequent treatment strategy for restaging of metastatic breast cancer. On chemotherapy. EXAM: NUCLEAR MEDICINE PET SKULL BASE TO THIGH TECHNIQUE: 8.3 mCi F-18 FDG was injected intravenously. Full-ring PET imaging was performed from the skull base to thigh after the radiotracer. CT data was obtained and used for attenuation correction and anatomic localization. Fasting blood glucose: 101 mg/dl COMPARISON:  08/09/2021 FINDINGS: Mediastinal blood pool activity: SUV max 2.4 Liver activity: SUV max NA NECK: No areas of abnormal hypermetabolism. Incidental CT findings: Cerebral and cerebellar atrophy. Surgical changes left globe. Minimal fluid right maxillary sinus. Bilateral carotid atherosclerosis. No cervical adenopathy. CHEST: No pulmonary parenchymal or thoracic nodal hypermetabolism. Incidental CT findings: Aortic and coronary artery calcification. Mild cardiomegaly. Mild right hemidiaphragm elevation. No axillary adenopathy.  ABDOMEN/PELVIS: No abdominopelvic nodal hypermetabolism. Bilateral foci of adrenal hypermetabolism, without nodule or mass. Example at up to a S.U.V. max of 3.3 on the right and a S.U.V. max of 3.1 on the left. Slightly increased compared to the prior. Presumed urinary contamination about the perineum. Incidental CT findings: Cholecystectomy. Abdominal aortic atherosclerosis. Normal noncontrast appearance of the liver, pancreas, kidneys, spleen. Hysterectomy. Pelvic floor laxity. SKELETON: No abnormal marrow activity. Left antecubital fossa hypermetabolism is presumably due to minimal extravasation of tracer. Incidental CT findings: Osteopenia. Diffuse lytic lesions, without correlate hypermetabolism. Bilateral rib deformities are likely due to pathologic fractures. Compression deformity at approximately T7 is mild-to-moderate and present on the prior. IMPRESSION: 1. Treated osseous metastasis, without significant hypermetabolism, as before. 2. No evidence of hypermetabolic soft tissue metastasis. 3. Mild bilateral adrenal hypermetabolism without well-defined mass. Favored to be physiologic. Recommend attention on follow-up. 4. Incidental findings, including: Sinus disease. Chronic T7 compression deformity. Electronically Signed   By: Abigail Miyamoto M.D.   On: 12/07/2021 09:27      Additional studies/ records that were reviewed today include:   At her last office visit I reviewed the office records of [name].  I reviewed her echo Doppler study and pulmonary function tests. I reviewed a  recent head CT  ASSESSMENT:    1. CAD S/P percutaneous coronary angioplasty   2. Essential hypertension   3. Dyslipidemia, goal LDL below 70   4. History of NSVT (nonsustained ventricular tachycardia)   5. Metastatic breast cancer [facility])     PLAN:  Kara Woods is an 88 year old Caucasian female who had developed progressive decline in exercise tolerance with significant increasing shortness of breath with  minimal activity associated with chest pressure and left arm radiation.  In 1998, a cardiac catheterization revealed normal coronary arteries.  An echo Doppler study from November 2017 showed an EF of 50% with diffuse hypokinesis.  There was mild MR and mild dilatation of her left atrium. Her pulmonary function studies did not identified significant pulmonary disability in the etiology of her dyspnea.  With her very strong family history of myocardial infarctions in multiple family members, as well as remote tobacco history and progressive changes in symptomatology she underwent repeat cardiac catheterization on November 04, 2016.  This now demonstrated severe multivessel CAD as noted above and she underwent successful stenting of her RCA as well as her left circumflex vessels in a staged procedure.  When I saw her, she had noticed some subsequent shortness of breath which improved with initiation of Spironolactone. She has a history of statin intolerance and had been on Zetia 10 mg daily. She developed TIA symptoms leading to ER evaluation in August 2021 and CT demonstrated chronic cerebellar infarct. She was referred for a cardiac monitor which showed predominant sinus rhythm. There was no evidence for atrial fibrillation; however she had run of nonsustained ventricular tachycardia at a rate of 133 bpm. She has a remote history of breast CA dating back to 2014 and unfortunately developed recurrence with stage IV metastatic disease with metastases to her skeletal bones, ribs, as well as spine. She has been started on Ibrance and anastrozole by Dr. Delton Coombes.  At her last evaluation with me in October 2022 she was having some episodes of lightheadedness and denied chest pain.  She underwent a follow-up echo Doppler study October 11, 2021 which showed an EF of 45% with grade 1 diastolic dysfunction.  There was mild dilation of her left atrium.  There was no aortic stenosis and only trivial mitral regurgitation was  demonstrated.  Her lightheadedness has resolved.  Her blood pressure today is stable.  Wearing support stockings.  Continues to be on metoprolol titrate but now takes 12.5 mg at bedtime with improvement in her prior symptomatology.  Will be undergoing a follow-up PET scan this week for reassessment of her metastatic breast CVA.  As long as she remains cardiac stable, I will see her in 1 year for reevaluation or sooner as needed.   Medication Adjustments/Labs and Tests Ordered: Current medicines are reviewed at length with the patient today.  Concerns regarding medicines are outlined above.  Medication changes, Labs and Tests ordered today are listed in the Patient Instructions below.  Patient Instructions  Medication Instructions:  The current medical regimen is effective;  continue present plan and medications as directed. Please refer to the Current Medication list given to you today.   *If you need a refill on your cardiac medications before your next appointment, please call your pharmacy*  Follow-Up: Your next appointment:  12 month(s) In Person with Shelva Majestic, MD   Please call our office 2 months in advance to schedule this appointment.  At [facility], you and your health needs are our priority.  As part of our continuing mission to provide you with exceptional heart care, we have created designated Provider Care Teams.  These Care Teams include your primary Cardiologist (physician) and Advanced Practice Providers (APPs -  Physician Assistants and Nurse Practitioners) who all work together to provide you with the care you need, when you need it.     Signed, Shelva Majestic, MD, [name] 12/19/2021 8:17 AM    [place] [address] [place], [place], [place]  [address] Phone: [phone]

## 2021-12-03 NOTE — Patient Instructions (Signed)
Medication Instructions:  The current medical regimen is effective;  continue present plan and medications as directed. Please refer to the Current Medication list given to you today.   *If you need a refill on your cardiac medications before your next appointment, please call your pharmacy*  Follow-Up: Your next appointment:  12 month(s) In Person with Shelva Majestic, MD   Please call our office 2 months in advance to schedule this appointment.  At [facility], you and your health needs are our priority.  As part of our continuing mission to provide you with exceptional heart care, we have created designated Provider Care Teams.  These Care Teams include your primary Cardiologist (physician) and Advanced Practice Providers (APPs -  Physician Assistants and Nurse Practitioners) who all work together to provide you with the care you need, when you need it.

## 2021-12-04 ENCOUNTER — Ambulatory Visit (HOSPITAL_COMMUNITY): Payer: Medicare Other

## 2021-12-04 ENCOUNTER — Other Ambulatory Visit (HOSPITAL_COMMUNITY): Payer: Medicare Other

## 2021-12-04 ENCOUNTER — Ambulatory Visit (HOSPITAL_COMMUNITY): Payer: Medicare Other | Admitting: Hematology

## 2021-12-06 ENCOUNTER — Other Ambulatory Visit: Payer: Self-pay

## 2021-12-06 ENCOUNTER — Encounter (HOSPITAL_COMMUNITY)
Admission: RE | Admit: 2021-12-06 | Discharge: 2021-12-06 | Disposition: A | Discharge status: Home / Self Care | Payer: Medicare Other | Source: Ambulatory Visit | Attending: Hematology | Admitting: Hematology

## 2021-12-06 ENCOUNTER — Inpatient Hospital Stay (HOSPITAL_COMMUNITY): Payer: Medicare Other | Attending: Hematology

## 2021-12-06 DIAGNOSIS — M8580 Other specified disorders of bone density and structure, unspecified site: Secondary | ICD-10-CM | POA: Insufficient documentation

## 2021-12-06 DIAGNOSIS — Z923 Personal history of irradiation: Secondary | ICD-10-CM | POA: Insufficient documentation

## 2021-12-06 DIAGNOSIS — Z170 Estrogen receptor positive status [ER+]: Secondary | ICD-10-CM | POA: Insufficient documentation

## 2021-12-06 DIAGNOSIS — C7951 Secondary malignant neoplasm of bone: Secondary | ICD-10-CM | POA: Diagnosis not present

## 2021-12-06 DIAGNOSIS — R197 Diarrhea, unspecified: Secondary | ICD-10-CM | POA: Diagnosis not present

## 2021-12-06 DIAGNOSIS — I700 Atherosclerosis of aorta: Secondary | ICD-10-CM | POA: Insufficient documentation

## 2021-12-06 DIAGNOSIS — I517 Cardiomegaly: Secondary | ICD-10-CM | POA: Insufficient documentation

## 2021-12-06 DIAGNOSIS — F32A Depression, unspecified: Secondary | ICD-10-CM | POA: Diagnosis not present

## 2021-12-06 DIAGNOSIS — Z955 Presence of coronary angioplasty implant and graft: Secondary | ICD-10-CM | POA: Diagnosis not present

## 2021-12-06 DIAGNOSIS — G319 Degenerative disease of nervous system, unspecified: Secondary | ICD-10-CM | POA: Insufficient documentation

## 2021-12-06 DIAGNOSIS — R5383 Other fatigue: Secondary | ICD-10-CM | POA: Diagnosis not present

## 2021-12-06 DIAGNOSIS — I6523 Occlusion and stenosis of bilateral carotid arteries: Secondary | ICD-10-CM | POA: Diagnosis not present

## 2021-12-06 DIAGNOSIS — M25511 Pain in right shoulder: Secondary | ICD-10-CM | POA: Insufficient documentation

## 2021-12-06 DIAGNOSIS — Z79811 Long term (current) use of aromatase inhibitors: Secondary | ICD-10-CM | POA: Diagnosis not present

## 2021-12-06 DIAGNOSIS — C50919 Malignant neoplasm of unspecified site of unspecified female breast: Secondary | ICD-10-CM | POA: Insufficient documentation

## 2021-12-06 DIAGNOSIS — Z87891 Personal history of nicotine dependence: Secondary | ICD-10-CM | POA: Diagnosis not present

## 2021-12-06 DIAGNOSIS — R634 Abnormal weight loss: Secondary | ICD-10-CM | POA: Insufficient documentation

## 2021-12-06 DIAGNOSIS — F419 Anxiety disorder, unspecified: Secondary | ICD-10-CM | POA: Diagnosis not present

## 2021-12-06 DIAGNOSIS — I2510 Atherosclerotic heart disease of native coronary artery without angina pectoris: Secondary | ICD-10-CM | POA: Diagnosis not present

## 2021-12-06 DIAGNOSIS — M954 Acquired deformity of chest and rib: Secondary | ICD-10-CM | POA: Diagnosis not present

## 2021-12-06 DIAGNOSIS — C50512 Malignant neoplasm of lower-outer quadrant of left female breast: Secondary | ICD-10-CM | POA: Insufficient documentation

## 2021-12-06 DIAGNOSIS — R1012 Left upper quadrant pain: Secondary | ICD-10-CM | POA: Insufficient documentation

## 2021-12-06 DIAGNOSIS — Z79899 Other long term (current) drug therapy: Secondary | ICD-10-CM | POA: Insufficient documentation

## 2021-12-06 DIAGNOSIS — J329 Chronic sinusitis, unspecified: Secondary | ICD-10-CM | POA: Diagnosis not present

## 2021-12-06 DIAGNOSIS — R351 Nocturia: Secondary | ICD-10-CM | POA: Diagnosis not present

## 2021-12-06 LAB — CBC WITH DIFFERENTIAL/PLATELET
Abs Immature Granulocytes: 0.02 K/uL (ref 0.00–0.07)
Basophils Absolute: 0 K/uL (ref 0.0–0.1)
Basophils Relative: 1 %
Eosinophils Absolute: 0 K/uL (ref 0.0–0.5)
Eosinophils Relative: 1 %
HCT: 38.2 % (ref 36.0–46.0)
Hemoglobin: 12.5 g/dL (ref 12.0–15.0)
Immature Granulocytes: 1 %
Lymphocytes Relative: 43 %
Lymphs Abs: 1.8 K/uL (ref 0.7–4.0)
MCH: 36 pg — ABNORMAL HIGH (ref 26.0–34.0)
MCHC: 32.7 g/dL (ref 30.0–36.0)
MCV: 110.1 fL — ABNORMAL HIGH (ref 80.0–100.0)
Monocytes Absolute: 0.3 K/uL (ref 0.1–1.0)
Monocytes Relative: 8 %
Neutro Abs: 2 K/uL (ref 1.7–7.7)
Neutrophils Relative %: 46 %
Platelets: 202 K/uL (ref 150–400)
RBC: 3.47 MIL/uL — ABNORMAL LOW (ref 3.87–5.11)
RDW: 14.2 % (ref 11.5–15.5)
WBC Morphology: REACTIVE
WBC: 4.2 K/uL (ref 4.0–10.5)
nRBC: 0 % (ref 0.0–0.2)

## 2021-12-06 LAB — COMPREHENSIVE METABOLIC PANEL WITH GFR
ALT: 9 U/L (ref 0–44)
AST: 15 U/L (ref 15–41)
Albumin: 4.2 g/dL (ref 3.5–5.0)
Alkaline Phosphatase: 35 U/L — ABNORMAL LOW (ref 38–126)
Anion gap: 7 (ref 5–15)
BUN: 12 mg/dL (ref 8–23)
CO2: 29 mmol/L (ref 22–32)
Calcium: 9.2 mg/dL (ref 8.9–10.3)
Chloride: 104 mmol/L (ref 98–111)
Creatinine, Ser: 0.65 mg/dL (ref 0.44–1.00)
GFR, Estimated: >60 mL/min (ref >60)
Glucose, Bld: 97 mg/dL (ref 70–99)
Potassium: 4.6 mmol/L (ref 3.5–5.1)
Sodium: 140 mmol/L (ref 135–145)
Total Bilirubin: 0.7 mg/dL (ref 0.3–1.2)
Total Protein: 7.5 g/dL (ref 6.5–8.1)

## 2021-12-06 IMAGING — CT NM PET TUM IMG RESTAG (PS) SKULL BASE T - THIGH
1 of 8 series · 1 of 25 positions shown · non-contrast
Comparison: [DATE]

CLINICAL DATA: Subsequent treatment strategy for restaging of
metastatic breast cancer. On chemotherapy.

EXAM:
NUCLEAR MEDICINE PET SKULL BASE TO THIGH
TECHNIQUE: 8.3 mCi F-18 FDG was injected intravenously. Full-ring PET imaging
was performed from the skull base to thigh after the radiotracer. CT
data was obtained and used for attenuation correction and anatomic
localization.
Fasting blood glucose: 101 mg/dl

[Series 3: ctac · axial · 3.0mm · 0.98mm/px · 1 of 293 slices shown]
[im 293/293  brain]
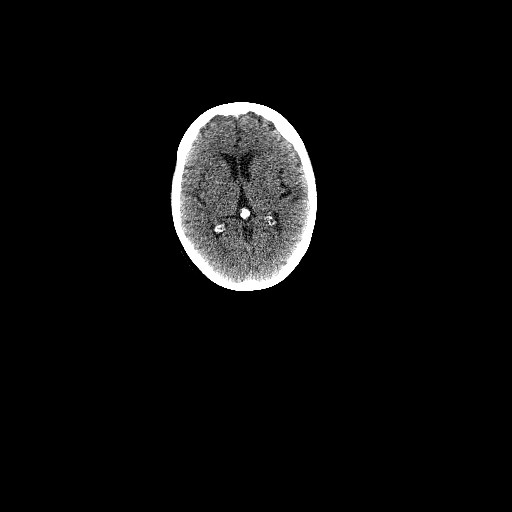

[1 of 25 positions shown; findings below may reference images not displayed]

FINDINGS: Mediastinal blood pool activity: SUV max

Liver activity: SUV max NA

NECK: No areas of abnormal hypermetabolism.

Incidental CT findings: Cerebral and cerebellar atrophy. Surgical
changes left globe. Minimal fluid right maxillary sinus. Bilateral
carotid atherosclerosis. No cervical adenopathy.

CHEST: No pulmonary parenchymal or thoracic nodal hypermetabolism.

Incidental CT findings: Aortic and coronary artery calcification.
Mild cardiomegaly. Mild right hemidiaphragm elevation. No axillary
adenopathy.

ABDOMEN/PELVIS: No abdominopelvic nodal hypermetabolism.

Bilateral foci of adrenal hypermetabolism, without nodule or mass.
Example at up to a S.U.V. max of 3.3 on the right and a S.U.V. max
of 3.1 on the left. Slightly increased compared to the prior.

Presumed urinary contamination about the perineum.

Incidental CT findings: Cholecystectomy. Abdominal aortic
atherosclerosis. Normal noncontrast appearance of the liver,
pancreas, kidneys, spleen. Hysterectomy. Pelvic floor laxity.

SKELETON: No abnormal marrow activity. Left antecubital fossa
hypermetabolism is presumably due to minimal extravasation of
tracer.

Incidental CT findings: Osteopenia. Diffuse lytic lesions, without
correlate hypermetabolism. Bilateral rib deformities are likely due
to pathologic fractures. Compression deformity at approximately T7
is mild-to-moderate and present on the prior.
IMPRESSION: 1. Treated osseous metastasis, without significant hypermetabolism,
as before.
2. No evidence of hypermetabolic soft tissue metastasis.
3. Mild bilateral adrenal hypermetabolism without well-defined mass.
Favored to be physiologic. Recommend attention on follow-up.
4. Incidental findings, including: Sinus disease. Chronic T7
compression deformity.

## 2021-12-06 MED ORDER — FLUDEOXYGLUCOSE F - 18 (FDG) INJECTION
8.3300 | Freq: Once | INTRAVENOUS | Status: AC | PRN
  Administered 2021-12-06: 8.33 via INTRAVENOUS

## 2021-12-07 ENCOUNTER — Other Ambulatory Visit (HOSPITAL_COMMUNITY): Payer: Self-pay

## 2021-12-07 ENCOUNTER — Telehealth (HOSPITAL_COMMUNITY): Payer: Self-pay | Admitting: Pharmacy Technician

## 2021-12-07 LAB — CANCER ANTIGEN 27.29: CA 27.29: 39.7 U/mL — ABNORMAL HIGH (ref 0.0–38.6)

## 2021-12-07 LAB — CANCER ANTIGEN 15-3: CA 15-3: 31.6 U/mL — ABNORMAL HIGH (ref 0.0–25.0)

## 2021-12-07 MED ORDER — PALBOCICLIB 75 MG PO TABS
75.0000 mg | ORAL_TABLET | Freq: Every day | ORAL | 6 refills | Status: DC
  Filled 2021-12-07: qty 21, 28d supply, fill #0
  Filled 2021-12-31: qty 21, 28d supply, fill #1
  Filled 2022-01-24 (×2): qty 21, 28d supply, fill #2
  Filled 2022-02-21: qty 21, 28d supply, fill #3
  Filled 2022-03-19: qty 21, 28d supply, fill #4
  Filled 2022-04-18: qty 21, 28d supply, fill #5
  Filled 2022-05-13: qty 21, 28d supply, fill #6

## 2021-12-07 NOTE — Telephone Encounter (Signed)
Oral Oncology Patient Advocate Encounter   Received notification from [facility] that the existing prior authorization for Kara Woods is due for renewal.   Renewal PA submitted on CoverMyMeds Key [id number] Status is pending   Oral Oncology Clinic will continue to follow.  [place] Patient [name] Phone [phone] Fax [phone] 12/07/2021 1:23 PM

## 2021-12-07 NOTE — Telephone Encounter (Signed)
Chart reviewed. Ibrance refilled per last office note from Dr. Delton Coombes

## 2021-12-07 NOTE — Telephone Encounter (Signed)
Oral Oncology Patient Advocate Encounter  Prior Authorization for Kara Woods has been approved.    PA# [id number] Effective dates: 11/25/21 through 12/07/22  Patients co-pay is $3391.77.  Patient has 2 grants to cover copay.  Oral Oncology Clinic will continue to follow.   [place] Patient [name] Phone [phone] Fax [phone] 12/07/2021 1:30 PM

## 2021-12-10 ENCOUNTER — Inpatient Hospital Stay (HOSPITAL_COMMUNITY): Payer: Medicare Other

## 2021-12-10 ENCOUNTER — Other Ambulatory Visit: Payer: Self-pay

## 2021-12-10 ENCOUNTER — Inpatient Hospital Stay (HOSPITAL_BASED_OUTPATIENT_CLINIC_OR_DEPARTMENT_OTHER): Payer: Medicare Other | Admitting: Hematology

## 2021-12-10 DIAGNOSIS — F32A Depression, unspecified: Secondary | ICD-10-CM | POA: Diagnosis not present

## 2021-12-10 DIAGNOSIS — I2510 Atherosclerotic heart disease of native coronary artery without angina pectoris: Secondary | ICD-10-CM | POA: Diagnosis not present

## 2021-12-10 DIAGNOSIS — C50512 Malignant neoplasm of lower-outer quadrant of left female breast: Secondary | ICD-10-CM | POA: Diagnosis not present

## 2021-12-10 DIAGNOSIS — Z79811 Long term (current) use of aromatase inhibitors: Secondary | ICD-10-CM | POA: Diagnosis not present

## 2021-12-10 DIAGNOSIS — Z853 Personal history of malignant neoplasm of breast: Secondary | ICD-10-CM

## 2021-12-10 DIAGNOSIS — Z170 Estrogen receptor positive status [ER+]: Secondary | ICD-10-CM | POA: Diagnosis not present

## 2021-12-10 DIAGNOSIS — C50919 Malignant neoplasm of unspecified site of unspecified female breast: Secondary | ICD-10-CM

## 2021-12-10 DIAGNOSIS — C7951 Secondary malignant neoplasm of bone: Secondary | ICD-10-CM | POA: Diagnosis not present

## 2021-12-10 MED ORDER — DENOSUMAB 120 MG/1.7ML ~~LOC~~ SOLN
120.0000 mg | Freq: Once | SUBCUTANEOUS | Status: AC
  Administered 2021-12-10: 120 mg via SUBCUTANEOUS
  Filled 2021-12-10: qty 1.7

## 2021-12-10 NOTE — Progress Notes (Signed)
[place] [address] [place] [address]   Patient Care Team: Celene Squibb, MD as PCP - General (Internal Medicine) Troy Sine, MD as PCP - Cardiology (Cardiology) Gala Romney Cristopher Estimable, MD (Gastroenterology) Brien Mates, RN as Oncology Nurse Navigator (Oncology)  SUMMARY OF ONCOLOGIC HISTORY: Oncology History   No history exists.    CHIEF COMPLIANT: Follow-up of left breast cancer   INTERVAL HISTORY: Kara Woods is a 88 y.o. female here today for follow up of her left breast cancer. Her last visit was on 10/09/2021.   Today she reports feeling well. She is taking Ibrance and tolerating it well. She reports hair loss on the right side of her head. Her appetite is good. She has lost 5-6 lbs since 01/09. She reports pain on the left side of her upper abdomen. She reports intermittent diarrhea for which she is taking 1 tablet of Imodium prn. She reports continued right shoulder pain.   REVIEW OF SYSTEMS:   Review of Systems  Constitutional:  Positive for fatigue. Negative for appetite change.  Gastrointestinal:  Positive for constipation and diarrhea.  Genitourinary:  Positive for nocturia.   Musculoskeletal:  Positive for arthralgias (R shoulder) and myalgias (5/10 leg).  Neurological:  Positive for dizziness and numbness.  Psychiatric/Behavioral:  Positive for sleep disturbance.   All other systems reviewed and are negative.  I have reviewed the past medical history, past surgical history, social history and family history with the patient and they are unchanged from previous note.   ALLERGIES:   is allergic to contrast media [iodinated contrast media], lipitor [atorvastatin], sulfamethoxazole, and sulfonamide derivatives.   MEDICATIONS:  Current Outpatient Medications  Medication Sig Dispense Refill   anastrozole (ARIMIDEX) 1 MG tablet TAKE 1 TABLET BY MOUTH EVERY DAY 90 tablet 2   aspirin 81 MG EC tablet Take 1 tablet (81 mg total)  by mouth daily. Swallow whole. 60 tablet 2   clorazepate (TRANXENE) 15 MG tablet Take 1 tablet (15 mg total) by mouth daily. 90 tablet 0   escitalopram (LEXAPRO) 20 MG tablet TAKE 1 TABLET BY MOUTH EVERY DAY 90 tablet 2   latanoprost (XALATAN) 0.005 % ophthalmic solution Place 1 drop into both eyes at bedtime.     metoprolol tartrate (LOPRESSOR) 25 MG tablet Take 12.5 mg by mouth at bedtime.     palbociclib (IBRANCE) 75 MG tablet Take 1 tablet (75 mg total) by mouth daily. Take for 21 days on, 7 days off, repeat every 28 days. 21 tablet 6   acetaminophen (TYLENOL) 325 MG tablet Take 2 tablets (650 mg total) by mouth every 6 (six) hours as needed for mild pain or headache (or temp > 37.5 C (99.5 F)). (Patient not taking: Reported on 12/10/2021) 40 tablet 0   carboxymethylcellulose (REFRESH PLUS) 0.5 % SOLN Place 3-4 drops into both eyes 3 (three) times daily as needed (dry eyes). Preservative free (Patient not taking: Reported on 12/10/2021)     HYDROcodone-acetaminophen (NORCO) 5-325 MG tablet Take 1 tablet by mouth every 4 (four) hours as needed for moderate pain. (Patient not taking: Reported on 12/10/2021) 180 tablet 0   nitroGLYCERIN (NITROSTAT) 0.4 MG SL tablet Place 1 tablet (0.4 mg total) under the tongue every 5 (five) minutes as needed. (Patient not taking: Reported on 12/10/2021) 25 tablet 3   No current facility-administered medications for this visit.     PHYSICAL EXAMINATION: Performance status (ECOG): 1 - Symptomatic but completely ambulatory  There  were no vitals filed for this visit. Wt Readings from Last 3 Encounters:  12/03/21 151 lb 9.6 oz (68.8 kg)  10/09/21 151 lb (68.5 kg)  09/17/21 151 lb (68.5 kg)   Physical Exam Vitals reviewed.  Constitutional:      Appearance: Normal appearance.  Cardiovascular:     Rate and Rhythm: Normal rate and regular rhythm.     Pulses: Normal pulses.     Heart sounds: Normal heart sounds.  Pulmonary:     Effort: Pulmonary effort is  normal.     Breath sounds: Normal breath sounds.  Neurological:     General: No focal deficit present.     Mental Status: She is alert and oriented to person, place, and time.  Psychiatric:        Mood and Affect: Mood normal.        Behavior: Behavior normal.    Breast Exam Chaperone: Kara Woods     LABORATORY DATA:  I have reviewed the data as listed CMP Latest Ref Rng & Units 12/06/2021 11/06/2021 10/09/2021  Glucose 70 - 99 mg/dL 97 124(H) 100(H)  BUN 8 - 23 mg/dL _0 Creatinine 0.44 - 1.00 mg/dL 0.65 0.70 0.75  Sodium 135 - 145 mmol/L 140 137 135  Potassium 3.5 - 5.1 mmol/L 4.6 3.7 4.8  Chloride 98 - 111 mmol/L 104 104 105  CO2 22 - 32 mmol/L _1 Calcium 8.9 - 10.3 mg/dL 9.2 8.6(L) 8.6(L)  Total Protein 6.5 - 8.1 g/dL 7.5 7.2 7.1  Total Bilirubin 0.3 - 1.2 mg/dL 0.7 0.6 0.9  Alkaline Phos 38 - 126 U/L 35(L) 39 37(L)  AST 15 - 41 U/L _2 ALT 0 - 44 U/L _3 Lab Results  Component Value Date   CAN153 31.6 (H) 12/06/2021   CAN153 27.2 (H) 10/09/2021   CAN153 27.0 (H) 08/14/2021   Lab Results  Component Value Date   WBC 4.2 12/06/2021   HGB 12.5 12/06/2021   HCT 38.2 12/06/2021   MCV 110.1 (H) 12/06/2021   PLT 202 12/06/2021   NEUTROABS 2.0 12/06/2021    ASSESSMENT:  1.  T2N0 left breast infiltrating lobular carcinoma: -Status post lumpectomy in June 2014, 1.4 cm, grade 1, lymphovascular invasion positive, ER 100%, PR 26%, Ki-67 18%. -She underwent XRT.  She did not take adjuvant endocrine therapy. -Mammogram on 07/13/2020, BI-RADS Category 1. -CEA was 10.3, Ca1 2515.8 and CA 19-9 05. -PET scan on 08/07/2020 shows widespread hypermetabolic bone meta stasis with soft tissue components at T1 and sacrum.  Thoracic nodal metastasis. -MRI of the thoracic spine on 08/15/2020 shows pathological fracture of T1 with mild osseous retropulsion and possible left C8 nerve root encroachment within the foramen at C7-T1.  No cord deformity. -Right third  rib biopsy on 08/22/2020 consistent with metastatic breast cancer, ER 100% positive, PR 40% positive, Ki-67 10%, HER-2 2+ and negative by FISH. -Ibrance 100 mg 3 weeks on 1 week off on anastrozole 1 mg daily started on 08/31/2020. -Ibrance held on 10/13/2020 secondary to severe tiredness. - PET scan on 02/19/2021 shows improvement in metastatic disease.   2.  Social/family history: -She quit smoking 20 years ago, smoked 2 packs/day for more than 20 years prior to quitting. -She lives at home by herself and is independent of all activities. -Daughter who has multiple myeloma at age 25.   PLAN:  1.  Metastatic cancer to the bones, highly likely breast cancer: -She is  taking anastrozole. - He is also taking Ibrance 75 mg 3 weeks on/1 week off. - Reviewed PET scan from 12/07/2011 which did not show any hypermetabolism.  No evidence of soft tissue lesions.  Mild bilateral adrenal hypermetabolism without well-defined mass favored to be physiologic. - Reviewed labs from 12/06/2021 which showed CA 15-3 of 31 and CA 27-29 of 39.  CBC was grossly normal.  LFTs are normal. - She has lost about 5 pounds since last visit.  We will keep a close eye on it.  RTC 2 months for follow-up with repeat tumor markers and labs.   2.  CAD and RCA stenting: -Continue metoprolol and aspirin.  Plavix was discontinued.   3.  Right shoulder pain/low back pain: -I have recommended follow-up with Dr. Aline Brochure.   4.  Bone metastasis: -Calcium is 9.2.  Continue denosumab today and monthly.   5.  Depression/anxiety: -Continue Lexapro daily.   6.  Diarrhea: -She has occasional diarrhea from [place]. - Continue Imodium as needed.  Breast Cancer therapy associated bone loss: I have recommended calcium, Vitamin D and weight bearing exercises.  Orders placed this encounter:  No orders of the defined types were placed in this encounter.   The patient has a good understanding of the overall plan. She agrees with it. She  will call with any problems that may develop before the next visit here.  Derek Jack, MD [place] [phone]   I, Kara Woods, am acting as a scribe for Dr. Derek Jack.  I, Derek Jack MD, have reviewed the above documentation for accuracy and completeness, and I agree with the above.

## 2021-12-10 NOTE — Patient Instructions (Addendum)
[place] at [facility] Discharge Instructions   You were seen and examined today by Dr. Delton Coombes.  He reviewed the results of your PET scan. There is no evidence of cancer on this scan.  Continue Ibrance and Arimidex as prescribed.  You will receive Xgeva injection today and monthly.  Return as scheduled in 2 months.    Thank you for choosing [place] at [facility] to provide your oncology and hematology care.  To afford each patient quality time with our provider, please arrive at least 15 minutes before your scheduled appointment time.   If you have a lab appointment with the [place] please come in thru the Main Entrance and check in at the main information desk.  You need to re-schedule your appointment should you arrive 10 or more minutes late.  We strive to give you quality time with our providers, and arriving late affects you and other patients whose appointments are after yours.  Also, if you no show three or more times for appointments you may be dismissed from the clinic at the providers discretion.     Again, thank you for choosing [facility].  Our hope is that these requests will decrease the amount of time that you wait before being seen by our physicians.       _____________________________________________________________  Should you have questions after your visit to [facility], please contact our office at [phone] and follow the prompts.  Our office hours are 8:00 a.m. and 4:30 p.m. Monday - Friday.  Please note that voicemails left after 4:00 p.m. may not be returned until the following business day.  We are closed weekends and major holidays.  You do have access to a nurse 24-7, just call the main number to the clinic [phone] and do not press any options, hold on the line and a nurse will answer the phone.    For prescription refill requests, have your pharmacy contact our  office and allow 72 hours.    Due to Covid, you will need to wear a mask upon entering the hospital. If you do not have a mask, a mask will be given to you at the Main Entrance upon arrival. For doctor visits, patients may have 1 support person age 13 or older with them. For treatment visits, patients can not have anyone with them due to social distancing guidelines and our immunocompromised population.

## 2021-12-10 NOTE — Progress Notes (Signed)
Patient is taking Ibrance as prescribed.  She has not missed any doses and reports no side effects at this time.   

## 2021-12-10 NOTE — Progress Notes (Signed)
Kara Woods presents today for injection per the provider's orders. Pt's Calcium is 9.2 today.  Xgeva  administration without incident; injection site WNL; see MAR for injection details. No recent or future dental appointments and no jaw at this time. Pt reports taking Calcium and Vit D supplements at as directed. Patient tolerated procedure well and without incident.  No questions or complaints noted at this time.   Xgeva injection given today per MD orders. Tolerated infusion without adverse affects. Vital signs stable. No complaints at this time. Discharged from clinic ambulatory in stable condition. Alert and oriented x 3. F/U with [facility] as scheduled.

## 2021-12-10 NOTE — Patient Instructions (Signed)
[place]  Discharge Instructions: Thank you for choosing [place] to provide your oncology and hematology care.  If you have a lab appointment with the [facility], please come in thru the Main Entrance and check in at the main information desk.  Wear comfortable clothing and clothing appropriate for easy access to any Portacath or PICC line.   We strive to give you quality time with your provider. You may need to reschedule your appointment if you arrive late (15 or more minutes).  Arriving late affects you and other patients whose appointments are after yours.  Also, if you miss three or more appointments without notifying the office, you may be dismissed from the clinic at the providers discretion.      For prescription refill requests, have your pharmacy contact our office and allow 72 hours for refills to be completed.    Today you received Xgeva injection.     BELOW ARE SYMPTOMS THAT SHOULD BE REPORTED IMMEDIATELY: *FEVER GREATER THAN 100.4 F (38 C) OR HIGHER *CHILLS OR SWEATING *NAUSEA AND VOMITING THAT IS NOT CONTROLLED WITH YOUR NAUSEA MEDICATION *UNUSUAL SHORTNESS OF BREATH *UNUSUAL BRUISING OR BLEEDING *URINARY PROBLEMS (pain or burning when urinating, or frequent urination) *BOWEL PROBLEMS (unusual diarrhea, constipation, pain near the anus) TENDERNESS IN MOUTH AND THROAT WITH OR WITHOUT PRESENCE OF ULCERS (sore throat, sores in mouth, or a toothache) UNUSUAL RASH, SWELLING OR PAIN  UNUSUAL VAGINAL DISCHARGE OR ITCHING   Items with * indicate a potential emergency and should be followed up as soon as possible or go to the Emergency Department if any problems should occur.  Please show the CHEMOTHERAPY ALERT CARD or IMMUNOTHERAPY ALERT CARD at check-in to the Emergency Department and triage nurse.  Should you have questions after your visit or need to cancel or reschedule your appointment, please contact [facility]  [phone]  and follow the prompts.  Office hours are 8:00 a.m. to 4:30 p.m. Monday - Friday. Please note that voicemails left after 4:00 p.m. may not be returned until the following business day.  We are closed weekends and major holidays. You have access to a nurse at all times for urgent questions. Please call the main number to the clinic [phone] and follow the prompts.  For any non-urgent questions, you may also contact your provider using MyChart. We now offer e-Visits for anyone 28 and older to request care online for non-urgent symptoms. For details visit [email or web address].   Also download the MyChart app! Go to the app store, search "MyChart", open the app, select [place], and log in with your MyChart username and password.  Due to Covid, a mask is required upon entering the hospital/clinic. If you do not have a mask, one will be given to you upon arrival. For doctor visits, patients may have 1 support person aged 55 or older with them. For treatment visits, patients cannot have anyone with them due to current Covid guidelines and our immunocompromised population.

## 2021-12-17 ENCOUNTER — Ambulatory Visit (INDEPENDENT_AMBULATORY_CARE_PROVIDER_SITE_OTHER): Payer: Medicare Other | Admitting: Orthopedic Surgery

## 2021-12-17 ENCOUNTER — Other Ambulatory Visit: Payer: Self-pay

## 2021-12-17 ENCOUNTER — Encounter: Payer: Self-pay | Admitting: Orthopedic Surgery

## 2021-12-17 VITALS — Ht 64.0 in | Wt 151.0 lb

## 2021-12-17 DIAGNOSIS — M7581 Other shoulder lesions, right shoulder: Secondary | ICD-10-CM

## 2021-12-17 NOTE — Progress Notes (Signed)
Orthopaedic Clinic Return  Assessment: Kara Woods is a 88 y.o. female with the following: Right shoulder and elbow pain  Plan: Patient continues to have pain in her right shoulder.  She has limited mobility.  Difficulty with overhead activities.  I remain concerned that she has sustained an injury to her rotator cuff tendons.  Previous injection only helped for approximately 3 weeks.  She is not interested in a repeat injection today.  She also has tenderness to palpation about the right elbow.  She states that she previously had some issues with phlebotomy recently.  Her pain is likely a response to some irritation of the musculature.  Continue with activities as tolerated.  If she wishes to return for an injection, I am happy to help.   Follow-up: Return if symptoms worsen or fail to improve.   Subjective:  Chief Complaint  Patient presents with   Shoulder Pain    Pain now returned worse than pre injection.  She said the injection only helped maybe 3 weeks, but not a lot at all.     History of Present Illness: Kara Woods is a 88 y.o. female who returns to clinic for repeat evaluation of right shoulder pain.  She was seen in clinic several months ago.  She had a right shoulder steroid injection, which improved her symptoms for approximately 3 months.  Her pain then progressively returned, and is now worse than it was before the injection.  No acute injuries otherwise.  She has difficulty with overhead motion.  She notes radiating pain distally towards her elbow.  She also has pain and tenderness to palpation about the right elbow.  She states that she had some blood work done recently, and had difficulty with blood draws.  Since then, she has had pain around the elbow.  Review of Systems: No numbness or tingling. No fevers or chills. No shortness of breath No chest pain   Objective: Ht 5\' 4"  (1.626 m)    Wt 151 lb (68.5 kg)    BMI 25.92 kg/m   Physical  Exam:  Elderly female.  Alert and oriented.  Evaluation of the right shoulder demonstrates no deformity.  Limited ability to get her arm above her head.  Forward flexion to approximately 90 degrees.  Tenderness to palpation within the triceps, as well as the common extensor and flexor tendon.  No bruising is appreciated.  She is able to range her elbow without difficulty.  IMAGING: I personally ordered and reviewed the following images:   No new imaging obtained today.  Mordecai Rasmussen, MD 12/17/2021 10:33 PM

## 2021-12-19 ENCOUNTER — Encounter: Payer: Self-pay | Admitting: Cardiovascular Disease

## 2021-12-24 DIAGNOSIS — R413 Other amnesia: Secondary | ICD-10-CM | POA: Diagnosis not present

## 2021-12-24 DIAGNOSIS — R350 Frequency of micturition: Secondary | ICD-10-CM | POA: Insufficient documentation

## 2021-12-24 DIAGNOSIS — R441 Visual hallucinations: Secondary | ICD-10-CM | POA: Insufficient documentation

## 2021-12-31 ENCOUNTER — Other Ambulatory Visit (HOSPITAL_COMMUNITY): Payer: Self-pay

## 2022-01-01 ENCOUNTER — Other Ambulatory Visit (HOSPITAL_COMMUNITY): Payer: Self-pay

## 2022-01-07 ENCOUNTER — Other Ambulatory Visit: Payer: Self-pay

## 2022-01-07 ENCOUNTER — Inpatient Hospital Stay (HOSPITAL_COMMUNITY): Payer: Medicare Other

## 2022-01-07 ENCOUNTER — Inpatient Hospital Stay (HOSPITAL_COMMUNITY): Payer: Medicare Other | Attending: Hematology

## 2022-01-07 VITALS — BP 128/70 | HR 89 | Temp 98.0°F | Resp 17 | Ht 64.0 in | Wt 152.4 lb

## 2022-01-07 DIAGNOSIS — I2510 Atherosclerotic heart disease of native coronary artery without angina pectoris: Secondary | ICD-10-CM | POA: Insufficient documentation

## 2022-01-07 DIAGNOSIS — C50919 Malignant neoplasm of unspecified site of unspecified female breast: Secondary | ICD-10-CM

## 2022-01-07 DIAGNOSIS — Z853 Personal history of malignant neoplasm of breast: Secondary | ICD-10-CM

## 2022-01-07 DIAGNOSIS — R5383 Other fatigue: Secondary | ICD-10-CM | POA: Diagnosis not present

## 2022-01-07 DIAGNOSIS — Z87891 Personal history of nicotine dependence: Secondary | ICD-10-CM | POA: Diagnosis not present

## 2022-01-07 DIAGNOSIS — F32A Depression, unspecified: Secondary | ICD-10-CM | POA: Insufficient documentation

## 2022-01-07 DIAGNOSIS — Z955 Presence of coronary angioplasty implant and graft: Secondary | ICD-10-CM | POA: Insufficient documentation

## 2022-01-07 DIAGNOSIS — C50512 Malignant neoplasm of lower-outer quadrant of left female breast: Secondary | ICD-10-CM | POA: Diagnosis not present

## 2022-01-07 DIAGNOSIS — C50212 Malignant neoplasm of upper-inner quadrant of left female breast: Secondary | ICD-10-CM

## 2022-01-07 DIAGNOSIS — F419 Anxiety disorder, unspecified: Secondary | ICD-10-CM | POA: Diagnosis not present

## 2022-01-07 DIAGNOSIS — Z923 Personal history of irradiation: Secondary | ICD-10-CM | POA: Insufficient documentation

## 2022-01-07 DIAGNOSIS — R351 Nocturia: Secondary | ICD-10-CM | POA: Diagnosis not present

## 2022-01-07 DIAGNOSIS — R634 Abnormal weight loss: Secondary | ICD-10-CM | POA: Diagnosis not present

## 2022-01-07 DIAGNOSIS — Z170 Estrogen receptor positive status [ER+]: Secondary | ICD-10-CM

## 2022-01-07 DIAGNOSIS — Z79811 Long term (current) use of aromatase inhibitors: Secondary | ICD-10-CM | POA: Insufficient documentation

## 2022-01-07 DIAGNOSIS — M25511 Pain in right shoulder: Secondary | ICD-10-CM | POA: Diagnosis not present

## 2022-01-07 DIAGNOSIS — R1012 Left upper quadrant pain: Secondary | ICD-10-CM | POA: Insufficient documentation

## 2022-01-07 DIAGNOSIS — Z79899 Other long term (current) drug therapy: Secondary | ICD-10-CM | POA: Diagnosis not present

## 2022-01-07 DIAGNOSIS — R197 Diarrhea, unspecified: Secondary | ICD-10-CM | POA: Diagnosis not present

## 2022-01-07 DIAGNOSIS — C7951 Secondary malignant neoplasm of bone: Secondary | ICD-10-CM | POA: Diagnosis not present

## 2022-01-07 LAB — COMPREHENSIVE METABOLIC PANEL
ALT: 10 U/L (ref 0–44)
AST: 17 U/L (ref 15–41)
Albumin: 3.8 g/dL (ref 3.5–5.0)
Alkaline Phosphatase: 36 U/L — ABNORMAL LOW (ref 38–126)
Anion gap: 9 (ref 5–15)
BUN: 14 mg/dL (ref 8–23)
CO2: 28 mmol/L (ref 22–32)
Calcium: 8.8 mg/dL — ABNORMAL LOW (ref 8.9–10.3)
Chloride: 101 mmol/L (ref 98–111)
Creatinine, Ser: 0.67 mg/dL (ref 0.44–1.00)
GFR, Estimated: >60 mL/min (ref >60)
Glucose, Bld: 110 mg/dL — ABNORMAL HIGH (ref 70–99)
Potassium: 4.1 mmol/L (ref 3.5–5.1)
Sodium: 138 mmol/L (ref 135–145)
Total Bilirubin: 0.3 mg/dL (ref 0.3–1.2)
Total Protein: 6.8 g/dL (ref 6.5–8.1)

## 2022-01-07 MED ORDER — DENOSUMAB 120 MG/1.7ML ~~LOC~~ SOLN
120.0000 mg | Freq: Once | SUBCUTANEOUS | Status: AC
  Administered 2022-01-07: 120 mg via SUBCUTANEOUS
  Filled 2022-01-07: qty 1.7

## 2022-01-07 NOTE — Patient Instructions (Signed)
[facility]  Discharge Instructions: Thank you for choosing [facility] to provide your oncology and hematology care.  If you have a lab appointment with the Cancer Center, please come in thru the Main Entrance and check in at the main information desk.  Wear comfortable clothing and clothing appropriate for easy access to any Portacath or PICC line.   We strive to give you quality time with your provider. You may need to reschedule your appointment if you arrive late (15 or more minutes).  Arriving late affects you and other patients whose appointments are after yours.  Also, if you miss three or more appointments without notifying the office, you may be dismissed from the clinic at the provider's discretion.      For prescription refill requests, have your pharmacy contact our office and allow 72 hours for refills to be completed.    Today you received the following chemotherapy and/or immunotherapy agents Xgeva      To help prevent nausea and vomiting after your treatment, we encourage you to take your nausea medication as directed.  BELOW ARE SYMPTOMS THAT SHOULD BE REPORTED IMMEDIATELY: *FEVER GREATER THAN 100.4 F (38 C) OR HIGHER *CHILLS OR SWEATING *NAUSEA AND VOMITING THAT IS NOT CONTROLLED WITH YOUR NAUSEA MEDICATION *UNUSUAL SHORTNESS OF BREATH *UNUSUAL BRUISING OR BLEEDING *URINARY PROBLEMS (pain or burning when urinating, or frequent urination) *BOWEL PROBLEMS (unusual diarrhea, constipation, pain near the anus) TENDERNESS IN MOUTH AND THROAT WITH OR WITHOUT PRESENCE OF ULCERS (sore throat, sores in mouth, or a toothache) UNUSUAL RASH, SWELLING OR PAIN  UNUSUAL VAGINAL DISCHARGE OR ITCHING   Items with * indicate a potential emergency and should be followed up as soon as possible or go to the Emergency Department if any problems should occur.  Please show the CHEMOTHERAPY ALERT CARD or IMMUNOTHERAPY ALERT CARD at check-in to the Emergency Department  and triage nurse.  Should you have questions after your visit or need to cancel or reschedule your appointment, please contact  CANCER CENTER [phone]  and follow the prompts.  Office hours are 8:00 a.m. to 4:30 p.m. Monday - Friday. Please note that voicemails left after 4:00 p.m. may not be returned until the following business day.  We are closed weekends and major holidays. You have access to a nurse at all times for urgent questions. Please call the main number to the clinic [phone] and follow the prompts.  For any non-urgent questions, you may also contact your provider using MyChart. We now offer e-Visits for anyone 18 and older to request care online for non-urgent symptoms. For details visit [email or web address].   Also download the MyChart app! Go to the app store, search "MyChart", open the app, select [facility], and log in with your MyChart username and password.  Due to Covid, a mask is required upon entering the hospital/clinic. If you do not have a mask, one will be given to you upon arrival. For doctor visits, patients may have 1 support person aged 18 or older with them. For treatment visits, patients cannot have anyone with them due to current Covid guidelines and our immunocompromised population.  

## 2022-01-07 NOTE — Progress Notes (Signed)
Kara Woods presents today for Xgeva injection per the provider's orders.  Stable during administration without incident; injection site WNL; see MAR for injection details.  Patient tolerated procedure well and without incident.  No questions or complaints noted at this time. Discharge from clinic ambulatory in stable condition.  Alert and oriented X 3.  Follow up with [facility] as scheduled.

## 2022-01-14 DIAGNOSIS — H18513 Endothelial corneal dystrophy, bilateral: Secondary | ICD-10-CM | POA: Diagnosis not present

## 2022-01-14 DIAGNOSIS — H1812 Bullous keratopathy, left eye: Secondary | ICD-10-CM | POA: Diagnosis not present

## 2022-01-14 DIAGNOSIS — H401131 Primary open-angle glaucoma, bilateral, mild stage: Secondary | ICD-10-CM | POA: Diagnosis not present

## 2022-01-14 DIAGNOSIS — H04123 Dry eye syndrome of bilateral lacrimal glands: Secondary | ICD-10-CM | POA: Diagnosis not present

## 2022-01-17 DIAGNOSIS — H18513 Endothelial corneal dystrophy, bilateral: Secondary | ICD-10-CM | POA: Diagnosis not present

## 2022-01-17 DIAGNOSIS — H50112 Monocular exotropia, left eye: Secondary | ICD-10-CM | POA: Diagnosis not present

## 2022-01-17 DIAGNOSIS — H401131 Primary open-angle glaucoma, bilateral, mild stage: Secondary | ICD-10-CM | POA: Diagnosis not present

## 2022-01-17 DIAGNOSIS — H1812 Bullous keratopathy, left eye: Secondary | ICD-10-CM | POA: Diagnosis not present

## 2022-01-24 ENCOUNTER — Other Ambulatory Visit (HOSPITAL_COMMUNITY): Payer: Self-pay

## 2022-01-28 ENCOUNTER — Inpatient Hospital Stay (HOSPITAL_COMMUNITY): Payer: Medicare Other | Attending: Hematology

## 2022-01-28 DIAGNOSIS — M25511 Pain in right shoulder: Secondary | ICD-10-CM | POA: Diagnosis not present

## 2022-01-28 DIAGNOSIS — C7951 Secondary malignant neoplasm of bone: Secondary | ICD-10-CM | POA: Insufficient documentation

## 2022-01-28 DIAGNOSIS — Z955 Presence of coronary angioplasty implant and graft: Secondary | ICD-10-CM | POA: Insufficient documentation

## 2022-01-28 DIAGNOSIS — Z170 Estrogen receptor positive status [ER+]: Secondary | ICD-10-CM | POA: Diagnosis not present

## 2022-01-28 DIAGNOSIS — I2510 Atherosclerotic heart disease of native coronary artery without angina pectoris: Secondary | ICD-10-CM | POA: Insufficient documentation

## 2022-01-28 DIAGNOSIS — Z923 Personal history of irradiation: Secondary | ICD-10-CM | POA: Insufficient documentation

## 2022-01-28 DIAGNOSIS — R5383 Other fatigue: Secondary | ICD-10-CM | POA: Diagnosis not present

## 2022-01-28 DIAGNOSIS — F32A Depression, unspecified: Secondary | ICD-10-CM | POA: Diagnosis not present

## 2022-01-28 DIAGNOSIS — Z87891 Personal history of nicotine dependence: Secondary | ICD-10-CM | POA: Insufficient documentation

## 2022-01-28 DIAGNOSIS — R1012 Left upper quadrant pain: Secondary | ICD-10-CM | POA: Insufficient documentation

## 2022-01-28 DIAGNOSIS — Z79899 Other long term (current) drug therapy: Secondary | ICD-10-CM | POA: Diagnosis not present

## 2022-01-28 DIAGNOSIS — R197 Diarrhea, unspecified: Secondary | ICD-10-CM | POA: Insufficient documentation

## 2022-01-28 DIAGNOSIS — Z79811 Long term (current) use of aromatase inhibitors: Secondary | ICD-10-CM | POA: Insufficient documentation

## 2022-01-28 DIAGNOSIS — F419 Anxiety disorder, unspecified: Secondary | ICD-10-CM | POA: Diagnosis not present

## 2022-01-28 DIAGNOSIS — C50919 Malignant neoplasm of unspecified site of unspecified female breast: Secondary | ICD-10-CM

## 2022-01-28 DIAGNOSIS — C50512 Malignant neoplasm of lower-outer quadrant of left female breast: Secondary | ICD-10-CM | POA: Diagnosis not present

## 2022-01-28 LAB — CBC WITH DIFFERENTIAL/PLATELET
Abs Immature Granulocytes: 0.01 10*3/uL (ref 0.00–0.07)
Basophils Absolute: 0.1 10*3/uL (ref 0.0–0.1)
Basophils Relative: 1 %
Eosinophils Absolute: 0.1 10*3/uL (ref 0.0–0.5)
Eosinophils Relative: 2 %
HCT: 36.4 % (ref 36.0–46.0)
Hemoglobin: 11.6 g/dL — ABNORMAL LOW (ref 12.0–15.0)
Immature Granulocytes: 0 %
Lymphocytes Relative: 47 %
Lymphs Abs: 1.8 10*3/uL (ref 0.7–4.0)
MCH: 34.3 pg — ABNORMAL HIGH (ref 26.0–34.0)
MCHC: 31.9 g/dL (ref 30.0–36.0)
MCV: 107.7 fL — ABNORMAL HIGH (ref 80.0–100.0)
Monocytes Absolute: 0.3 10*3/uL (ref 0.1–1.0)
Monocytes Relative: 8 %
Neutro Abs: 1.6 10*3/uL — ABNORMAL LOW (ref 1.7–7.7)
Neutrophils Relative %: 42 %
Platelets: 157 10*3/uL (ref 150–400)
RBC: 3.38 MIL/uL — ABNORMAL LOW (ref 3.87–5.11)
RDW: 14.1 % (ref 11.5–15.5)
WBC: 3.9 10*3/uL — ABNORMAL LOW (ref 4.0–10.5)
nRBC: 0 % (ref 0.0–0.2)

## 2022-01-28 LAB — COMPREHENSIVE METABOLIC PANEL
ALT: 11 U/L (ref 0–44)
AST: 16 U/L (ref 15–41)
Albumin: 3.8 g/dL (ref 3.5–5.0)
Alkaline Phosphatase: 37 U/L — ABNORMAL LOW (ref 38–126)
Anion gap: 7 (ref 5–15)
BUN: 12 mg/dL (ref 8–23)
CO2: 27 mmol/L (ref 22–32)
Calcium: 8.8 mg/dL — ABNORMAL LOW (ref 8.9–10.3)
Chloride: 103 mmol/L (ref 98–111)
Creatinine, Ser: 0.72 mg/dL (ref 0.44–1.00)
GFR, Estimated: >60 mL/min (ref >60)
Glucose, Bld: 75 mg/dL (ref 70–99)
Potassium: 4.4 mmol/L (ref 3.5–5.1)
Sodium: 137 mmol/L (ref 135–145)
Total Bilirubin: 0.4 mg/dL (ref 0.3–1.2)
Total Protein: 7.3 g/dL (ref 6.5–8.1)

## 2022-01-29 LAB — CANCER ANTIGEN 27.29: CA 27.29: 41.4 U/mL — ABNORMAL HIGH (ref 0.0–38.6)

## 2022-01-29 LAB — CANCER ANTIGEN 15-3: CA 15-3: 26.8 U/mL — ABNORMAL HIGH (ref 0.0–25.0)

## 2022-01-30 ENCOUNTER — Encounter (HOSPITAL_COMMUNITY): Payer: Self-pay | Admitting: Hematology

## 2022-01-30 ENCOUNTER — Other Ambulatory Visit (HOSPITAL_COMMUNITY): Payer: Self-pay

## 2022-01-30 ENCOUNTER — Other Ambulatory Visit (HOSPITAL_COMMUNITY): Payer: Medicare Other

## 2022-01-30 ENCOUNTER — Inpatient Hospital Stay (HOSPITAL_COMMUNITY): Payer: Medicare Other

## 2022-01-30 ENCOUNTER — Inpatient Hospital Stay (HOSPITAL_BASED_OUTPATIENT_CLINIC_OR_DEPARTMENT_OTHER): Payer: Medicare Other | Admitting: Hematology

## 2022-01-30 ENCOUNTER — Other Ambulatory Visit: Payer: Self-pay

## 2022-01-30 VITALS — BP 108/73 | HR 80 | Temp 96.7°F | Resp 18 | Ht 64.57 in | Wt 151.2 lb

## 2022-01-30 DIAGNOSIS — Z853 Personal history of malignant neoplasm of breast: Secondary | ICD-10-CM

## 2022-01-30 DIAGNOSIS — Z170 Estrogen receptor positive status [ER+]: Secondary | ICD-10-CM

## 2022-01-30 DIAGNOSIS — I2510 Atherosclerotic heart disease of native coronary artery without angina pectoris: Secondary | ICD-10-CM | POA: Diagnosis not present

## 2022-01-30 DIAGNOSIS — F32A Depression, unspecified: Secondary | ICD-10-CM | POA: Diagnosis not present

## 2022-01-30 DIAGNOSIS — C50919 Malignant neoplasm of unspecified site of unspecified female breast: Secondary | ICD-10-CM

## 2022-01-30 DIAGNOSIS — C50212 Malignant neoplasm of upper-inner quadrant of left female breast: Secondary | ICD-10-CM | POA: Diagnosis not present

## 2022-01-30 DIAGNOSIS — C7951 Secondary malignant neoplasm of bone: Secondary | ICD-10-CM | POA: Diagnosis not present

## 2022-01-30 DIAGNOSIS — C50512 Malignant neoplasm of lower-outer quadrant of left female breast: Secondary | ICD-10-CM | POA: Diagnosis not present

## 2022-01-30 DIAGNOSIS — Z79811 Long term (current) use of aromatase inhibitors: Secondary | ICD-10-CM | POA: Diagnosis not present

## 2022-01-30 MED ORDER — DENOSUMAB 120 MG/1.7ML ~~LOC~~ SOLN
120.0000 mg | Freq: Once | SUBCUTANEOUS | Status: AC
  Administered 2022-01-30: 120 mg via SUBCUTANEOUS
  Filled 2022-01-30: qty 1.7

## 2022-01-30 NOTE — Patient Instructions (Signed)
[facility] at [facility] ?Discharge Instructions ? ? ?You were seen and examined today by Dr. Delton Coombes. ? ?Continue Ibrance as prescribed. ? ?Continue Xgeva (bone shot) monthly. ? ?We will see you back in 3 months with a PET scan and lab work prior.  ? ?Thank you for choosing [place] at [facility] to provide your oncology and hematology care.  To afford each patient quality time with our provider, please arrive at least 15 minutes before your scheduled appointment time.  ? ?If you have a lab appointment with the [facility] please come in thru the Main Entrance and check in at the main information desk. ? ?You need to re-schedule your appointment should you arrive 10 or more minutes late.  We strive to give you quality time with our providers, and arriving late affects you and other patients whose appointments are after yours.  Also, if you no show three or more times for appointments you may be dismissed from the clinic at the providers discretion.     ?Again, thank you for choosing [facility].  Our hope is that these requests will decrease the amount of time that you wait before being seen by our physicians.       ?_____________________________________________________________ ? ?Should you have questions after your visit to [facility], please contact our office at [phone] and follow the prompts.  Our office hours are 8:00 a.m. and 4:30 p.m. Monday - Friday.  Please note that voicemails left after 4:00 p.m. may not be returned until the following business day.  We are closed weekends and major holidays.  You do have access to a nurse 24-7, just call the main number to the clinic [phone] and do not press any options, hold on the line and a nurse will answer the phone.   ? ?For prescription refill requests, have your pharmacy contact our office and allow 72 hours.   ? ?Due to Covid, you will need to wear a mask upon  entering the hospital. If you do not have a mask, a mask will be given to you at the Main Entrance upon arrival. For doctor visits, patients may have 1 support person age 39 or older with them. For treatment visits, patients can not have anyone with them due to social distancing guidelines and our immunocompromised population.  ? ?   ?

## 2022-01-30 NOTE — Patient Instructions (Signed)
[facility]  Discharge Instructions: ?Thank you for choosing [facility] to provide your oncology and hematology care.  ?If you have a lab appointment with the [facility], please come in thru the Main Entrance and check in at the main information desk. ? ?Wear comfortable clothing and clothing appropriate for easy access to any Portacath or PICC line.  ? ?We strive to give you quality time with your provider. You may need to reschedule your appointment if you arrive late (15 or more minutes).  Arriving late affects you and other patients whose appointments are after yours.  Also, if you miss three or more appointments without notifying the office, you may be dismissed from the clinic at the provider?s discretion.    ?  ?For prescription refill requests, have your pharmacy contact our office and allow 72 hours for refills to be completed.   ? ?Today you received the following chemotherapy and/or immunotherapy agents  Xgeva   ?  ? ?BELOW ARE SYMPTOMS THAT SHOULD BE REPORTED IMMEDIATELY: ?*FEVER GREATER THAN 100.4 F (38 ?C) OR HIGHER ?*CHILLS OR SWEATING ?*NAUSEA AND VOMITING THAT IS NOT CONTROLLED WITH YOUR NAUSEA MEDICATION ?*UNUSUAL SHORTNESS OF BREATH ?*UNUSUAL BRUISING OR BLEEDING ?*URINARY PROBLEMS (pain or burning when urinating, or frequent urination) ?*BOWEL PROBLEMS (unusual diarrhea, constipation, pain near the anus) ?TENDERNESS IN MOUTH AND THROAT WITH OR WITHOUT PRESENCE OF ULCERS (sore throat, sores in mouth, or a toothache) ?UNUSUAL RASH, SWELLING OR PAIN  ?UNUSUAL VAGINAL DISCHARGE OR ITCHING  ? ?Items with * indicate a potential emergency and should be followed up as soon as possible or go to the Emergency Department if any problems should occur. ? ?Please show the CHEMOTHERAPY ALERT CARD or IMMUNOTHERAPY ALERT CARD at check-in to the Emergency Department and triage nurse. ? ?Should you have questions after your visit or need to cancel or reschedule your appointment,  please contact [facility] [phone]  and follow the prompts.  Office hours are 8:00 a.m. to 4:30 p.m. Monday - Friday. Please note that voicemails left after 4:00 p.m. may not be returned until the following business day.  We are closed weekends and major holidays. You have access to a nurse at all times for urgent questions. Please call the main number to the clinic [phone] and follow the prompts. ? ?For any non-urgent questions, you may also contact your provider using MyChart. We now offer e-Visits for anyone 44 and older to request care online for non-urgent symptoms. For details visit [email or web address]. ?  ?Also download the MyChart app! Go to the app store, search "MyChart", open the app, select [facility], and log in with your MyChart username and password. ? ?Due to Covid, a mask is required upon entering the hospital/clinic. If you do not have a mask, one will be given to you upon arrival. For doctor visits, patients may have 1 support person aged 41 or older with them. For treatment visits, patients cannot have anyone with them due to current Covid guidelines and our immunocompromised population.  ?

## 2022-01-30 NOTE — Progress Notes (Signed)
Kara Woods presents today for injection per the provider's orders.  Xgeva administration without incident; injection site WNL; see MAR for injection details. No recent or dental appointments at this time. No tooth or jaw pain report. Pt reports taking Calcium and Vit D supplements as directed.Patient tolerated procedure well and without incident.  No questions or complaints noted at this time. Calcium noted to be 8.8.  ? ?Discharged from clinic ambulatory in stable condition. Alert and oriented x 3. F/U with [facility] as scheduled.   ?

## 2022-01-30 NOTE — Progress Notes (Signed)
? ?[facility] ?[address] ?[place], [place] [address] ? ? ?Patient Care Team: ?Celene Squibb, MD as PCP - General (Internal Medicine) ?Troy Sine, MD as PCP - Cardiology (Cardiology) ?Rourk, Cristopher Estimable, MD (Gastroenterology) ?Brien Mates, RN as Oncology Nurse Navigator (Oncology) ? ?SUMMARY OF ONCOLOGIC HISTORY: ?Oncology History  ? No history exists.  ? ? ?CHIEF COMPLIANT: Follow-up of left breast cancer ? ? ?INTERVAL HISTORY: Kara Woods is a 88 y.o. female here today for follow up of her Follow-up of left breast cancer. Her last visit was on 12/10/2021.  ? ?Today she reports feeling good, and she is accompanied by her daughter.  She is taking Ibrance and anastrozole and is tolerating them well. She denies new pains, nausea, and vomiting. She reports intermittent constipation and diarrhea. Her appetite is fair. Her energy has improved. Her weight is stable.  ? ?REVIEW OF SYSTEMS:   ?Review of Systems  ?Constitutional:  Negative for appetite change, fatigue and unexpected weight change.  ?Gastrointestinal:  Positive for constipation and diarrhea. Negative for nausea and vomiting.  ?All other systems reviewed and are negative. ? ?I have reviewed the past medical history, past surgical history, social history and family history with the patient and they are unchanged from previous note. ? ? ?ALLERGIES:   ?is allergic to contrast media [iodinated contrast media], lipitor [atorvastatin], sulfamethoxazole, and sulfonamide derivatives. ? ? ?MEDICATIONS:  ?Current Outpatient Medications  ?Medication Sig Dispense Refill  ? acetaminophen (TYLENOL) 325 MG tablet Take 2 tablets (650 mg total) by mouth every 6 (six) hours as needed for mild pain or headache (or temp > 37.5 C (99.5 F)). 40 tablet 0  ? anastrozole (ARIMIDEX) 1 MG tablet TAKE 1 TABLET BY MOUTH EVERY DAY 90 tablet 2  ? aspirin 81 MG EC tablet Take 1 tablet (81 mg total) by mouth daily. Swallow whole. 60 tablet 2  ?  carboxymethylcellulose (REFRESH PLUS) 0.5 % SOLN Place 3-4 drops into both eyes 3 (three) times daily as needed (dry eyes). Preservative free    ? clorazepate (TRANXENE) 15 MG tablet Take 1 tablet (15 mg total) by mouth daily. 90 tablet 0  ? escitalopram (LEXAPRO) 20 MG tablet TAKE 1 TABLET BY MOUTH EVERY DAY 90 tablet 2  ? HYDROcodone-acetaminophen (NORCO) 5-325 MG tablet Take 1 tablet by mouth every 4 (four) hours as needed for moderate pain. 180 tablet 0  ? latanoprost (XALATAN) 0.005 % ophthalmic solution Place 1 drop into both eyes at bedtime.    ? metoprolol tartrate (LOPRESSOR) 25 MG tablet Take 12.5 mg by mouth at bedtime.    ? nitroGLYCERIN (NITROSTAT) 0.4 MG SL tablet Place 1 tablet (0.4 mg total) under the tongue every 5 (five) minutes as needed. 25 tablet 3  ? palbociclib (IBRANCE) 75 MG tablet Take 1 tablet (75 mg total) by mouth daily. Take for 21 days on, 7 days off, repeat every 28 days. 21 tablet 6  ? ?No current facility-administered medications for this visit.  ? ? ? ?PHYSICAL EXAMINATION: ?Performance status (ECOG): 1 - Symptomatic but completely ambulatory ? ?Vitals:  ? 01/30/22 1114  ?BP: 108/73  ?Pulse: 80  ?Resp: 18  ?Temp: (!) 96.7 ?F (35.9 ?C)  ?SpO2: 100%  ? ?Wt Readings from Last 3 Encounters:  ?01/30/22 151 lb 3.2 oz (68.6 kg)  ?01/07/22 152 lb 6.4 oz (69.1 kg)  ?12/17/21 151 lb (68.5 kg)  ? ?Physical Exam ?Vitals reviewed.  ?Constitutional:   ?   Appearance:  Normal appearance.  ?Cardiovascular:  ?   Rate and Rhythm: Normal rate and regular rhythm.  ?   Pulses: Normal pulses.  ?   Heart sounds: Normal heart sounds.  ?Pulmonary:  ?   Effort: Pulmonary effort is normal.  ?   Breath sounds: Normal breath sounds.  ?Abdominal:  ?   Palpations: Abdomen is soft. There is no mass.  ?   Tenderness: There is no abdominal tenderness.  ?Neurological:  ?   General: No focal deficit present.  ?   Mental Status: She is alert and oriented to person, place, and time.  ?Psychiatric:     ?   Mood and  Affect: Mood normal.     ?   Behavior: Behavior normal.  ? ? ?Breast Exam Chaperone: Kara Woods   ? ? ?LABORATORY DATA:  ?I have reviewed the data as listed ?CMP Latest Ref Rng & Units 01/28/2022 01/07/2022 12/06/2021  ?Glucose 70 - 99 mg/dL 75 110(H) 97  ?BUN 8 - 23 mg/dL 12 14 12   ?Creatinine 0.44 - 1.00 mg/dL 0.72 0.67 0.65  ?Sodium 135 - 145 mmol/L 137 138 140  ?Potassium 3.5 - 5.1 mmol/L 4.4 4.1 4.6  ?Chloride 98 - 111 mmol/L 103 101 104  ?CO2 22 - 32 mmol/L 27 28 29   ?Calcium 8.9 - 10.3 mg/dL 8.8(L) 8.8(L) 9.2  ?Total Protein 6.5 - 8.1 g/dL 7.3 6.8 7.5  ?Total Bilirubin 0.3 - 1.2 mg/dL 0.4 0.3 0.7  ?Alkaline Phos 38 - 126 U/L 37(L) 36(L) 35(L)  ?AST 15 - 41 U/L 16 17 15   ?ALT 0 - 44 U/L 11 10 9   ? ?Lab Results  ?Component Value Date  ? KDX833 26.8 (H) 01/28/2022  ? ASN053 31.6 (H) 12/06/2021  ? [id number] 27.2 (H) 10/09/2021  ? ?Lab Results  ?Component Value Date  ? WBC 3.9 (L) 01/28/2022  ? HGB 11.6 (L) 01/28/2022  ? HCT 36.4 01/28/2022  ? MCV 107.7 (H) 01/28/2022  ? PLT 157 01/28/2022  ? NEUTROABS 1.6 (L) 01/28/2022  ? ? ?ASSESSMENT:  ?1.  T2N0 left breast infiltrating lobular carcinoma: ?-Status post lumpectomy in June 2014, 1.4 cm, grade 1, lymphovascular invasion positive, ER 100%, PR 26%, Ki-67 18%. ?-She underwent XRT.  She did not take adjuvant endocrine therapy. ?-Mammogram on 07/13/2020, BI-RADS Category 1. ?-CEA was 10.3, Ca1 2515.8 and CA 19-9 05. ?-PET scan on 08/07/2020 shows widespread hypermetabolic bone meta stasis with soft tissue components at T1 and sacrum.  Thoracic nodal metastasis. ?-MRI of the thoracic spine on 08/15/2020 shows pathological fracture of T1 with mild osseous retropulsion and possible left C8 nerve root encroachment within the foramen at C7-T1.  No cord deformity. ?-Right third rib biopsy on 08/22/2020 consistent with metastatic breast cancer, ER 100% positive, PR 40% positive, Ki-67 10%, HER-2 2+ and negative by FISH. ?-Ibrance 100 mg 3 weeks on 1 week off on anastrozole 1 mg  daily started on 08/31/2020. ?-Ibrance held on 10/13/2020 secondary to severe tiredness. ?- PET scan on 02/19/2021 shows improvement in metastatic disease. ?  ?2.  Social/family history: ?-She quit smoking 20 years ago, smoked 2 packs/day for more than 20 years prior to quitting. ?-She lives at home by herself and is independent of all activities. ?-Daughter who has multiple myeloma at age 3. ? ? ?PLAN:  ?1.  Metastatic cancer to the bones, highly likely breast cancer: ?- She is tolerating anastrozole reasonably well. ?- She is also tolerating Ibrance 75 mg 3 weeks on/1 week off very well. ?-  PET scan from 12/06/2021 did not show any hypermetabolism. ?- Reviewed labs today which showed CA 15-3 is downtrending.  CA 27-29 is stable around 41.  White count is 3.9 with ANC of 1.6, from [place].  Rest of the CBC was grossly normal.  LFTs are normal. ?- Continue Ibrance at the same dose.  She is functioning well.  RTC 3 months for follow-up.  I plan to repeat tumor markers and PET scan prior to next visit. ?  ?2.  CAD and RCA stenting: ?- Continue metoprolol and aspirin. ?  ?3.  Right shoulder pain/low back pain: ?- She has occasional right shoulder pain which is not bothering her much. ?  ?4.  Bone metastasis: ?- Calcium is 8.8.  Continue denosumab today. ?  ?5.  Depression/anxiety: ?- Continue Lexapro daily. ?  ?6.  Diarrhea: ?- Continue Imodium as needed. ? ?Breast Cancer therapy associated bone loss: I have recommended calcium, Vitamin D and weight bearing exercises. ? ?Orders placed this encounter:  ?No orders of the defined types were placed in this encounter. ? ? ?The patient has a good understanding of the overall plan. She agrees with it. She will call with any problems that may develop before the next visit here. ? ?Derek Jack, MD ?[facility] ?[phone] ? ? ?I, Kara Woods, am acting as a scribe for Dr. Derek Jack. ? ?I, Derek Jack MD, have reviewed the above  documentation for accuracy and completeness, and I agree with the above. ?  ? ? ?

## 2022-01-30 NOTE — Progress Notes (Signed)
Patient is taking Ibrance as prescribed.  She has not missed any doses and reports no side effects at this time.   

## 2022-01-31 ENCOUNTER — Other Ambulatory Visit (HOSPITAL_COMMUNITY): Payer: Self-pay

## 2022-02-12 ENCOUNTER — Other Ambulatory Visit: Payer: Self-pay | Admitting: Cardiovascular Disease

## 2022-02-21 ENCOUNTER — Other Ambulatory Visit (HOSPITAL_COMMUNITY): Payer: Self-pay

## 2022-02-26 ENCOUNTER — Other Ambulatory Visit (HOSPITAL_COMMUNITY): Payer: Self-pay

## 2022-02-27 ENCOUNTER — Inpatient Hospital Stay (HOSPITAL_COMMUNITY): Payer: Medicare Other | Attending: Hematology

## 2022-02-27 ENCOUNTER — Inpatient Hospital Stay (HOSPITAL_COMMUNITY): Payer: Medicare Other

## 2022-02-27 ENCOUNTER — Encounter (HOSPITAL_COMMUNITY): Payer: Self-pay

## 2022-02-27 VITALS — BP 108/58 | HR 92 | Temp 98.5°F | Resp 18

## 2022-02-27 DIAGNOSIS — C7951 Secondary malignant neoplasm of bone: Secondary | ICD-10-CM | POA: Diagnosis not present

## 2022-02-27 DIAGNOSIS — Z87891 Personal history of nicotine dependence: Secondary | ICD-10-CM | POA: Insufficient documentation

## 2022-02-27 DIAGNOSIS — F32A Depression, unspecified: Secondary | ICD-10-CM | POA: Insufficient documentation

## 2022-02-27 DIAGNOSIS — Z79899 Other long term (current) drug therapy: Secondary | ICD-10-CM | POA: Diagnosis not present

## 2022-02-27 DIAGNOSIS — M25511 Pain in right shoulder: Secondary | ICD-10-CM | POA: Diagnosis not present

## 2022-02-27 DIAGNOSIS — C50212 Malignant neoplasm of upper-inner quadrant of left female breast: Secondary | ICD-10-CM

## 2022-02-27 DIAGNOSIS — R1012 Left upper quadrant pain: Secondary | ICD-10-CM | POA: Diagnosis not present

## 2022-02-27 DIAGNOSIS — R5383 Other fatigue: Secondary | ICD-10-CM | POA: Diagnosis not present

## 2022-02-27 DIAGNOSIS — F419 Anxiety disorder, unspecified: Secondary | ICD-10-CM | POA: Diagnosis not present

## 2022-02-27 DIAGNOSIS — Z923 Personal history of irradiation: Secondary | ICD-10-CM | POA: Diagnosis not present

## 2022-02-27 DIAGNOSIS — Z79811 Long term (current) use of aromatase inhibitors: Secondary | ICD-10-CM | POA: Diagnosis not present

## 2022-02-27 DIAGNOSIS — R197 Diarrhea, unspecified: Secondary | ICD-10-CM | POA: Diagnosis not present

## 2022-02-27 DIAGNOSIS — Z170 Estrogen receptor positive status [ER+]: Secondary | ICD-10-CM | POA: Diagnosis not present

## 2022-02-27 DIAGNOSIS — Z955 Presence of coronary angioplasty implant and graft: Secondary | ICD-10-CM | POA: Insufficient documentation

## 2022-02-27 DIAGNOSIS — Z853 Personal history of malignant neoplasm of breast: Secondary | ICD-10-CM

## 2022-02-27 DIAGNOSIS — I2510 Atherosclerotic heart disease of native coronary artery without angina pectoris: Secondary | ICD-10-CM | POA: Diagnosis not present

## 2022-02-27 DIAGNOSIS — C50512 Malignant neoplasm of lower-outer quadrant of left female breast: Secondary | ICD-10-CM | POA: Insufficient documentation

## 2022-02-27 DIAGNOSIS — C50919 Malignant neoplasm of unspecified site of unspecified female breast: Secondary | ICD-10-CM

## 2022-02-27 LAB — COMPREHENSIVE METABOLIC PANEL
ALT: 9 U/L (ref 0–44)
AST: 18 U/L (ref 15–41)
Albumin: 3.9 g/dL (ref 3.5–5.0)
Alkaline Phosphatase: 36 U/L — ABNORMAL LOW (ref 38–126)
Anion gap: 7 (ref 5–15)
BUN: 15 mg/dL (ref 8–23)
CO2: 26 mmol/L (ref 22–32)
Calcium: 9 mg/dL (ref 8.9–10.3)
Chloride: 105 mmol/L (ref 98–111)
Creatinine, Ser: 0.81 mg/dL (ref 0.44–1.00)
GFR, Estimated: >60 mL/min (ref >60)
Glucose, Bld: 110 mg/dL — ABNORMAL HIGH (ref 70–99)
Potassium: 3.9 mmol/L (ref 3.5–5.1)
Sodium: 138 mmol/L (ref 135–145)
Total Bilirubin: 0.5 mg/dL (ref 0.3–1.2)
Total Protein: 7.6 g/dL (ref 6.5–8.1)

## 2022-02-27 MED ORDER — DENOSUMAB 120 MG/1.7ML ~~LOC~~ SOLN
120.0000 mg | Freq: Once | SUBCUTANEOUS | Status: AC
  Administered 2022-02-27: 120 mg via SUBCUTANEOUS
  Filled 2022-02-27: qty 1.7

## 2022-02-27 NOTE — Progress Notes (Signed)
Patient tolerated injection with no complaints voiced.  Site clean and dry with no bruising or swelling noted at site.  See MAR for details.  Band aid applied.  Patient stable during and after injection.  Vss with discharge and left in satisfactory condition with no s/s of distress noted.  

## 2022-02-27 NOTE — Patient Instructions (Signed)
[facility]  Discharge Instructions: Thank you for choosing [facility] to provide your oncology and hematology care.  If you have a lab appointment with the Cancer Center, please come in thru the Main Entrance and check in at the main information desk.  Wear comfortable clothing and clothing appropriate for easy access to any Portacath or PICC line.   We strive to give you quality time with your provider. You may need to reschedule your appointment if you arrive late (15 or more minutes).  Arriving late affects you and other patients whose appointments are after yours.  Also, if you miss three or more appointments without notifying the office, you may be dismissed from the clinic at the provider's discretion.      For prescription refill requests, have your pharmacy contact our office and allow 72 hours for refills to be completed.    Today you received the following chemotherapy and/or immunotherapy agents xgeva.       To help prevent nausea and vomiting after your treatment, we encourage you to take your nausea medication as directed.  BELOW ARE SYMPTOMS THAT SHOULD BE REPORTED IMMEDIATELY: *FEVER GREATER THAN 100.4 F (38 C) OR HIGHER *CHILLS OR SWEATING *NAUSEA AND VOMITING THAT IS NOT CONTROLLED WITH YOUR NAUSEA MEDICATION *UNUSUAL SHORTNESS OF BREATH *UNUSUAL BRUISING OR BLEEDING *URINARY PROBLEMS (pain or burning when urinating, or frequent urination) *BOWEL PROBLEMS (unusual diarrhea, constipation, pain near the anus) TENDERNESS IN MOUTH AND THROAT WITH OR WITHOUT PRESENCE OF ULCERS (sore throat, sores in mouth, or a toothache) UNUSUAL RASH, SWELLING OR PAIN  UNUSUAL VAGINAL DISCHARGE OR ITCHING   Items with * indicate a potential emergency and should be followed up as soon as possible or go to the Emergency Department if any problems should occur.  Please show the CHEMOTHERAPY ALERT CARD or IMMUNOTHERAPY ALERT CARD at check-in to the Emergency  Department and triage nurse.  Should you have questions after your visit or need to cancel or reschedule your appointment, please contact [facility] [phone]  and follow the prompts.  Office hours are 8:00 a.m. to 4:30 p.m. Monday - Friday. Please note that voicemails left after 4:00 p.m. may not be returned until the following business day.  We are closed weekends and major holidays. You have access to a nurse at all times for urgent questions. Please call the main number to the clinic [phone] and follow the prompts.  For any non-urgent questions, you may also contact your provider using MyChart. We now offer e-Visits for anyone 18 and older to request care online for non-urgent symptoms. For details visit [email or web address].   Also download the MyChart app! Go to the app store, search "MyChart", open the app, select [facility], and log in with your MyChart username and password.  Due to Covid, a mask is required upon entering the hospital/clinic. If you do not have a mask, one will be given to you upon arrival. For doctor visits, patients may have 1 support person aged 18 or older with them. For treatment visits, patients cannot have anyone with them due to current Covid guidelines and our immunocompromised population.  

## 2022-03-19 ENCOUNTER — Other Ambulatory Visit (HOSPITAL_COMMUNITY): Payer: Self-pay

## 2022-03-26 ENCOUNTER — Other Ambulatory Visit (HOSPITAL_COMMUNITY): Payer: Self-pay

## 2022-03-27 ENCOUNTER — Inpatient Hospital Stay (HOSPITAL_COMMUNITY): Payer: Medicare Other

## 2022-03-27 ENCOUNTER — Inpatient Hospital Stay (HOSPITAL_COMMUNITY): Payer: Medicare Other | Attending: Hematology

## 2022-03-27 VITALS — BP 130/74 | HR 88 | Temp 98.0°F | Resp 16 | Wt 148.4 lb

## 2022-03-27 DIAGNOSIS — Z853 Personal history of malignant neoplasm of breast: Secondary | ICD-10-CM

## 2022-03-27 DIAGNOSIS — C50512 Malignant neoplasm of lower-outer quadrant of left female breast: Secondary | ICD-10-CM | POA: Diagnosis not present

## 2022-03-27 DIAGNOSIS — Z79899 Other long term (current) drug therapy: Secondary | ICD-10-CM | POA: Insufficient documentation

## 2022-03-27 DIAGNOSIS — Z87891 Personal history of nicotine dependence: Secondary | ICD-10-CM | POA: Insufficient documentation

## 2022-03-27 DIAGNOSIS — Z955 Presence of coronary angioplasty implant and graft: Secondary | ICD-10-CM | POA: Insufficient documentation

## 2022-03-27 DIAGNOSIS — F419 Anxiety disorder, unspecified: Secondary | ICD-10-CM | POA: Insufficient documentation

## 2022-03-27 DIAGNOSIS — Z79811 Long term (current) use of aromatase inhibitors: Secondary | ICD-10-CM | POA: Insufficient documentation

## 2022-03-27 DIAGNOSIS — I2510 Atherosclerotic heart disease of native coronary artery without angina pectoris: Secondary | ICD-10-CM | POA: Diagnosis not present

## 2022-03-27 DIAGNOSIS — Z923 Personal history of irradiation: Secondary | ICD-10-CM | POA: Insufficient documentation

## 2022-03-27 DIAGNOSIS — C7951 Secondary malignant neoplasm of bone: Secondary | ICD-10-CM | POA: Diagnosis not present

## 2022-03-27 DIAGNOSIS — F32A Depression, unspecified: Secondary | ICD-10-CM | POA: Insufficient documentation

## 2022-03-27 DIAGNOSIS — C50919 Malignant neoplasm of unspecified site of unspecified female breast: Secondary | ICD-10-CM

## 2022-03-27 DIAGNOSIS — Z170 Estrogen receptor positive status [ER+]: Secondary | ICD-10-CM | POA: Insufficient documentation

## 2022-03-27 DIAGNOSIS — C50212 Malignant neoplasm of upper-inner quadrant of left female breast: Secondary | ICD-10-CM

## 2022-03-27 LAB — COMPREHENSIVE METABOLIC PANEL
ALT: 9 U/L (ref 0–44)
AST: 17 U/L (ref 15–41)
Albumin: 3.7 g/dL (ref 3.5–5.0)
Alkaline Phosphatase: 37 U/L — ABNORMAL LOW (ref 38–126)
Anion gap: 6 (ref 5–15)
BUN: 13 mg/dL (ref 8–23)
CO2: 28 mmol/L (ref 22–32)
Calcium: 8.9 mg/dL (ref 8.9–10.3)
Chloride: 105 mmol/L (ref 98–111)
Creatinine, Ser: 0.73 mg/dL (ref 0.44–1.00)
GFR, Estimated: >60 mL/min (ref >60)
Glucose, Bld: 94 mg/dL (ref 70–99)
Potassium: 4.5 mmol/L (ref 3.5–5.1)
Sodium: 139 mmol/L (ref 135–145)
Total Bilirubin: 0.6 mg/dL (ref 0.3–1.2)
Total Protein: 7.1 g/dL (ref 6.5–8.1)

## 2022-03-27 MED ORDER — DENOSUMAB 120 MG/1.7ML ~~LOC~~ SOLN
120.0000 mg | Freq: Once | SUBCUTANEOUS | Status: AC
  Administered 2022-03-27: 120 mg via SUBCUTANEOUS
  Filled 2022-03-27: qty 1.7

## 2022-03-27 NOTE — Patient Instructions (Signed)
[facility]  Discharge Instructions: ?Thank you for choosing [facility] to provide your oncology and hematology care.  ?If you have a lab appointment with the [facility], please come in thru the Main Entrance and check in at the main information desk. ? ?Wear comfortable clothing and clothing appropriate for easy access to any Portacath or PICC line.  ? ?We strive to give you quality time with your provider. You may need to reschedule your appointment if you arrive late (15 or more minutes).  Arriving late affects you and other patients whose appointments are after yours.  Also, if you miss three or more appointments without notifying the office, you may be dismissed from the clinic at the provider?s discretion.    ?  ?For prescription refill requests, have your pharmacy contact our office and allow 72 hours for refills to be completed.   ? ?Today you received Xgeva injection ?  ? ?BELOW ARE SYMPTOMS THAT SHOULD BE REPORTED IMMEDIATELY: ?*FEVER GREATER THAN 100.4 F (38 ?C) OR HIGHER ?*CHILLS OR SWEATING ?*NAUSEA AND VOMITING THAT IS NOT CONTROLLED WITH YOUR NAUSEA MEDICATION ?*UNUSUAL SHORTNESS OF BREATH ?*UNUSUAL BRUISING OR BLEEDING ?*URINARY PROBLEMS (pain or burning when urinating, or frequent urination) ?*BOWEL PROBLEMS (unusual diarrhea, constipation, pain near the anus) ?TENDERNESS IN MOUTH AND THROAT WITH OR WITHOUT PRESENCE OF ULCERS (sore throat, sores in mouth, or a toothache) ?UNUSUAL RASH, SWELLING OR PAIN  ?UNUSUAL VAGINAL DISCHARGE OR ITCHING  ? ?Items with * indicate a potential emergency and should be followed up as soon as possible or go to the Emergency Department if any problems should occur. ? ?Please show the CHEMOTHERAPY ALERT CARD or IMMUNOTHERAPY ALERT CARD at check-in to the Emergency Department and triage nurse. ? ?Should you have questions after your visit or need to cancel or reschedule your appointment, please contact [facility] [phone]   and follow the prompts.  Office hours are 8:00 a.m. to 4:30 p.m. Monday - Friday. Please note that voicemails left after 4:00 p.m. may not be returned until the following business day.  We are closed weekends and major holidays. You have access to a nurse at all times for urgent questions. Please call the main number to the clinic [phone] and follow the prompts. ? ?For any non-urgent questions, you may also contact your provider using MyChart. We now offer e-Visits for anyone 35 and older to request care online for non-urgent symptoms. For details visit [email or web address]. ?  ?Also download the MyChart app! Go to the app store, search "MyChart", open the app, select [facility], and log in with your MyChart username and password. ? ?Due to Covid, a mask is required upon entering the hospital/clinic. If you do not have a mask, one will be given to you upon arrival. For doctor visits, patients may have 1 support person aged 38 or older with them. For treatment visits, patients cannot have anyone with them due to current Covid guidelines and our immunocompromised population.  ?

## 2022-03-27 NOTE — Progress Notes (Signed)
Gale Jordan Likes presents today for injection per the provider's orders.  Xgeva administration without incident; injection site WNL; see MAR for injection details. Pt denies any tooth or jaw pain. No recent or future dental appointments. Calcium noted to be 8.9 today. Pt reports taking Calcium and Vit D supplements as directed.  Patient tolerated procedure well and without incident. No questions or complaints noted at this time. ? ?Discharged from clinic ambulatory in stable condition. Alert and oriented x 3. F/U with [facility] as scheduled.   ?

## 2022-03-29 ENCOUNTER — Other Ambulatory Visit (HOSPITAL_COMMUNITY): Payer: Self-pay

## 2022-04-15 DIAGNOSIS — Z0001 Encounter for general adult medical examination with abnormal findings: Secondary | ICD-10-CM | POA: Diagnosis not present

## 2022-04-15 DIAGNOSIS — C7951 Secondary malignant neoplasm of bone: Secondary | ICD-10-CM | POA: Diagnosis not present

## 2022-04-15 DIAGNOSIS — R7301 Impaired fasting glucose: Secondary | ICD-10-CM | POA: Diagnosis not present

## 2022-04-15 DIAGNOSIS — N3944 Nocturnal enuresis: Secondary | ICD-10-CM | POA: Diagnosis not present

## 2022-04-15 DIAGNOSIS — I10 Essential (primary) hypertension: Secondary | ICD-10-CM | POA: Diagnosis not present

## 2022-04-15 DIAGNOSIS — I2510 Atherosclerotic heart disease of native coronary artery without angina pectoris: Secondary | ICD-10-CM | POA: Diagnosis not present

## 2022-04-15 DIAGNOSIS — H6121 Impacted cerumen, right ear: Secondary | ICD-10-CM | POA: Diagnosis not present

## 2022-04-15 DIAGNOSIS — E663 Overweight: Secondary | ICD-10-CM | POA: Diagnosis not present

## 2022-04-15 DIAGNOSIS — Z6825 Body mass index (BMI) 25.0-25.9, adult: Secondary | ICD-10-CM | POA: Diagnosis not present

## 2022-04-15 DIAGNOSIS — E782 Mixed hyperlipidemia: Secondary | ICD-10-CM | POA: Diagnosis not present

## 2022-04-15 DIAGNOSIS — F4321 Adjustment disorder with depressed mood: Secondary | ICD-10-CM | POA: Insufficient documentation

## 2022-04-15 DIAGNOSIS — F4320 Adjustment disorder, unspecified: Secondary | ICD-10-CM | POA: Diagnosis not present

## 2022-04-15 DIAGNOSIS — F411 Generalized anxiety disorder: Secondary | ICD-10-CM | POA: Diagnosis not present

## 2022-04-18 ENCOUNTER — Other Ambulatory Visit (HOSPITAL_COMMUNITY): Payer: Self-pay

## 2022-04-18 ENCOUNTER — Other Ambulatory Visit: Payer: Self-pay | Admitting: Cardiovascular Disease

## 2022-04-23 ENCOUNTER — Other Ambulatory Visit (HOSPITAL_COMMUNITY): Payer: Self-pay

## 2022-04-24 ENCOUNTER — Other Ambulatory Visit (HOSPITAL_COMMUNITY): Payer: Medicare Other

## 2022-04-24 ENCOUNTER — Ambulatory Visit (HOSPITAL_COMMUNITY): Payer: Medicare Other

## 2022-04-25 ENCOUNTER — Encounter (HOSPITAL_COMMUNITY): Payer: Self-pay

## 2022-04-25 ENCOUNTER — Inpatient Hospital Stay (HOSPITAL_COMMUNITY): Payer: Medicare Other

## 2022-04-25 ENCOUNTER — Encounter (HOSPITAL_COMMUNITY)
Admission: RE | Admit: 2022-04-25 | Discharge: 2022-04-25 | Disposition: A | Discharge status: Home / Self Care | Payer: Medicare Other | Source: Ambulatory Visit | Attending: Hematology | Admitting: Hematology

## 2022-04-25 ENCOUNTER — Inpatient Hospital Stay (HOSPITAL_COMMUNITY): Payer: Medicare Other | Attending: Hematology

## 2022-04-25 VITALS — BP 131/80 | HR 92 | Temp 98.1°F | Resp 18

## 2022-04-25 DIAGNOSIS — C7951 Secondary malignant neoplasm of bone: Secondary | ICD-10-CM | POA: Insufficient documentation

## 2022-04-25 DIAGNOSIS — J439 Emphysema, unspecified: Secondary | ICD-10-CM | POA: Insufficient documentation

## 2022-04-25 DIAGNOSIS — C50919 Malignant neoplasm of unspecified site of unspecified female breast: Secondary | ICD-10-CM | POA: Insufficient documentation

## 2022-04-25 DIAGNOSIS — I700 Atherosclerosis of aorta: Secondary | ICD-10-CM | POA: Diagnosis not present

## 2022-04-25 DIAGNOSIS — Z853 Personal history of malignant neoplasm of breast: Secondary | ICD-10-CM

## 2022-04-25 DIAGNOSIS — I2510 Atherosclerotic heart disease of native coronary artery without angina pectoris: Secondary | ICD-10-CM | POA: Diagnosis not present

## 2022-04-25 LAB — MAGNESIUM: Magnesium: 1.7 mg/dL (ref 1.7–2.4)

## 2022-04-25 LAB — COMPREHENSIVE METABOLIC PANEL
ALT: 9 U/L (ref 0–44)
AST: 17 U/L (ref 15–41)
Albumin: 3.8 g/dL (ref 3.5–5.0)
Alkaline Phosphatase: 37 U/L — ABNORMAL LOW (ref 38–126)
Anion gap: 7 (ref 5–15)
BUN: 12 mg/dL (ref 8–23)
CO2: 27 mmol/L (ref 22–32)
Calcium: 9.1 mg/dL (ref 8.9–10.3)
Chloride: 105 mmol/L (ref 98–111)
Creatinine, Ser: 0.73 mg/dL (ref 0.44–1.00)
GFR, Estimated: >60 mL/min (ref >60)
Glucose, Bld: 79 mg/dL (ref 70–99)
Potassium: 4.5 mmol/L (ref 3.5–5.1)
Sodium: 139 mmol/L (ref 135–145)
Total Bilirubin: 0.6 mg/dL (ref 0.3–1.2)
Total Protein: 7.2 g/dL (ref 6.5–8.1)

## 2022-04-25 LAB — CBC WITH DIFFERENTIAL/PLATELET
Abs Immature Granulocytes: 0.01 10*3/uL (ref 0.00–0.07)
Basophils Absolute: 0 10*3/uL (ref 0.0–0.1)
Basophils Relative: 1 %
Eosinophils Absolute: 0 10*3/uL (ref 0.0–0.5)
Eosinophils Relative: 1 %
HCT: 36.6 % (ref 36.0–46.0)
Hemoglobin: 12.1 g/dL (ref 12.0–15.0)
Immature Granulocytes: 0 %
Lymphocytes Relative: 50 %
Lymphs Abs: 2 10*3/uL (ref 0.7–4.0)
MCH: 35.3 pg — ABNORMAL HIGH (ref 26.0–34.0)
MCHC: 33.1 g/dL (ref 30.0–36.0)
MCV: 106.7 fL — ABNORMAL HIGH (ref 80.0–100.0)
Monocytes Absolute: 0.3 10*3/uL (ref 0.1–1.0)
Monocytes Relative: 7 %
Neutro Abs: 1.6 10*3/uL — ABNORMAL LOW (ref 1.7–7.7)
Neutrophils Relative %: 41 %
Platelets: 139 10*3/uL — ABNORMAL LOW (ref 150–400)
RBC: 3.43 MIL/uL — ABNORMAL LOW (ref 3.87–5.11)
RDW: 14.1 % (ref 11.5–15.5)
WBC: 3.9 10*3/uL — ABNORMAL LOW (ref 4.0–10.5)
nRBC: 0 % (ref 0.0–0.2)

## 2022-04-25 IMAGING — PT NM PET TUM IMG RESTAG (PS) SKULL BASE T - THIGH
1 of 8 series · 1 of 25 positions shown · non-contrast
Comparison: [DATE]

CLINICAL DATA: Subsequent treatment strategy for metastatic breast
carcinoma.

EXAM:
NUCLEAR MEDICINE PET SKULL BASE TO THIGH
TECHNIQUE: 7.4 mCi F-18 FDG was injected intravenously. Full-ring PET imaging
was performed from the skull base to thigh after the radiotracer. CT
data was obtained and used for attenuation correction and anatomic
localization.
Fasting blood glucose: 95 mg/dl

[Series 3: ctac · axial · 3.0mm · 0.98mm/px · 1 of 293 slices shown]
[im 293/293  brain]
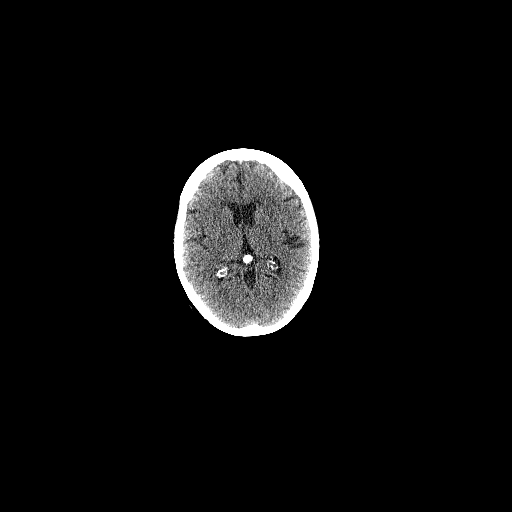

[1 of 25 positions shown; findings below may reference images not displayed]

FINDINGS: Mediastinal blood-pool activity (background): SUV max =

Liver activity (reference): SUV max = N/A

NECK:  No hypermetabolic lymph nodes or masses.

Incidental CT findings:  None.

CHEST: No hypermetabolic lymph nodes. No suspicious pulmonary
nodules seen on CT images.

Incidental CT findings: Aortic and coronary atherosclerotic
calcification incidentally noted. Mild emphysema also seen.

ABDOMEN/PELVIS: No abnormal hypermetabolic activity within the
liver, pancreas, adrenal glands, or spleen. No hypermetabolic lymph
nodes in the abdomen or pelvis. Mild symmetric FDG uptake is again
seen in both normal appearing adrenal glands, which is considered
physiologic.

Incidental CT findings: Aortic atherosclerotic calcification
incidentally noted.

SKELETON: Stable lytic bone lesions without FDG uptake, consistent
with treated metastatic disease.

Incidental CT findings:  None.
IMPRESSION: No evidence of active metastatic disease.

Stable treated osseous metastatic disease.

Aortic Atherosclerosis ([OW]-[OW]) and Emphysema ([OW]-[OW]).

## 2022-04-25 MED ORDER — FLUDEOXYGLUCOSE F - 18 (FDG) INJECTION
7.4300 | Freq: Once | INTRAVENOUS | Status: AC | PRN
Start: 2022-04-25 — End: 2022-04-25
  Administered 2022-04-25: 7.43 via INTRAVENOUS

## 2022-04-25 MED ORDER — DENOSUMAB 120 MG/1.7ML ~~LOC~~ SOLN
120.0000 mg | Freq: Once | SUBCUTANEOUS | Status: AC
  Administered 2022-04-25: 120 mg via SUBCUTANEOUS
  Filled 2022-04-25: qty 1.7

## 2022-04-26 LAB — CANCER ANTIGEN 15-3: CA 15-3: 30.4 U/mL — ABNORMAL HIGH (ref 0.0–25.0)

## 2022-04-26 LAB — CANCER ANTIGEN 27.29: CA 27.29: 40.3 U/mL — ABNORMAL HIGH (ref 0.0–38.6)

## 2022-04-30 ENCOUNTER — Ambulatory Visit (HOSPITAL_COMMUNITY): Payer: Medicare Other | Admitting: Hematology

## 2022-05-06 ENCOUNTER — Inpatient Hospital Stay (HOSPITAL_BASED_OUTPATIENT_CLINIC_OR_DEPARTMENT_OTHER): Payer: Medicare Other | Admitting: Hematology

## 2022-05-06 VITALS — BP 128/72 | HR 83 | Temp 99.3°F | Resp 18 | Ht 64.0 in | Wt 150.1 lb

## 2022-05-06 DIAGNOSIS — F32A Depression, unspecified: Secondary | ICD-10-CM | POA: Insufficient documentation

## 2022-05-06 DIAGNOSIS — Z79811 Long term (current) use of aromatase inhibitors: Secondary | ICD-10-CM | POA: Diagnosis not present

## 2022-05-06 DIAGNOSIS — F419 Anxiety disorder, unspecified: Secondary | ICD-10-CM | POA: Insufficient documentation

## 2022-05-06 DIAGNOSIS — I2510 Atherosclerotic heart disease of native coronary artery without angina pectoris: Secondary | ICD-10-CM | POA: Diagnosis not present

## 2022-05-06 DIAGNOSIS — C7951 Secondary malignant neoplasm of bone: Secondary | ICD-10-CM | POA: Diagnosis not present

## 2022-05-06 DIAGNOSIS — C50512 Malignant neoplasm of lower-outer quadrant of left female breast: Secondary | ICD-10-CM | POA: Diagnosis not present

## 2022-05-06 DIAGNOSIS — C50212 Malignant neoplasm of upper-inner quadrant of left female breast: Secondary | ICD-10-CM | POA: Diagnosis not present

## 2022-05-06 DIAGNOSIS — Z170 Estrogen receptor positive status [ER+]: Secondary | ICD-10-CM

## 2022-05-06 DIAGNOSIS — Z87891 Personal history of nicotine dependence: Secondary | ICD-10-CM | POA: Insufficient documentation

## 2022-05-06 DIAGNOSIS — Z955 Presence of coronary angioplasty implant and graft: Secondary | ICD-10-CM | POA: Diagnosis not present

## 2022-05-06 DIAGNOSIS — Z923 Personal history of irradiation: Secondary | ICD-10-CM | POA: Diagnosis not present

## 2022-05-06 DIAGNOSIS — Z79899 Other long term (current) drug therapy: Secondary | ICD-10-CM | POA: Diagnosis not present

## 2022-05-06 NOTE — Progress Notes (Signed)
 [facility] [address] [place], [place] [address]   Patient Care Team: Hall, John Z, MD as PCP - General (Internal Medicine) Kelly, Thomas A, MD as PCP - Cardiology (Cardiology) Rourk, Robert M, MD (Gastroenterology) Edwards, Morgan P, RN as Oncology Nurse Navigator (Oncology)  SUMMARY OF ONCOLOGIC HISTORY: Oncology History   No history exists.    CHIEF COMPLIANT: Follow-up of left breast cancer   INTERVAL HISTORY: [name] is a 88 y.o. female here today for follow up of her left breast cancer. Her last visit was on 01/30/2022.   Today she reports feeling good. She is taking Ibrance and anastrozole and is tolerating them well. She denies jaw pain. She reports pain in the back of her neck and right shoulder pain. She is not taking calcium tablets.   REVIEW OF SYSTEMS:   Review of Systems  Constitutional:  Negative for appetite change and fatigue.  Respiratory:  Positive for cough.   Musculoskeletal:  Positive for arthralgias (R shoulder) and neck pain.  Neurological:  Positive for dizziness and numbness (R hand).  Psychiatric/Behavioral:  Positive for sleep disturbance.   All other systems reviewed and are negative.   I have reviewed the past medical history, past surgical history, social history and family history with the patient and they are unchanged from previous note.   ALLERGIES:   is allergic to contrast media [iodinated contrast media], lipitor [atorvastatin], sulfamethoxazole, and sulfonamide derivatives.   MEDICATIONS:  Current Outpatient Medications  Medication Sig Dispense Refill   acetaminophen (TYLENOL) 325 MG tablet Take 2 tablets (650 mg total) by mouth every 6 (six) hours as needed for mild pain or headache (or temp > 37.5 C (99.5 F)). 40 tablet 0   anastrozole (ARIMIDEX) 1 MG tablet TAKE 1 TABLET BY MOUTH EVERY DAY 90 tablet 2   aspirin 81 MG EC tablet Take 1 tablet (81 mg total) by mouth daily. Swallow whole. 60 tablet 2    azithromycin (ZITHROMAX) 250 MG tablet TAKE 2 TABLETS BY MOUTH TODAY, THEN TAKE 1 TABLET DAILY FOR 4 DAYS     carboxymethylcellulose (REFRESH PLUS) 0.5 % SOLN Place 3-4 drops into both eyes 3 (three) times daily as needed (dry eyes). Preservative free     clorazepate (TRANXENE) 15 MG tablet Take 1 tablet (15 mg total) by mouth daily. 90 tablet 0   escitalopram (LEXAPRO) 20 MG tablet TAKE 1 TABLET BY MOUTH EVERY DAY 90 tablet 2   HYDROcodone-acetaminophen (NORCO) 5-325 MG tablet Take 1 tablet by mouth every 4 (four) hours as needed for moderate pain. 180 tablet 0   latanoprost (XALATAN) 0.005 % ophthalmic solution Place 1 drop into both eyes at bedtime.     metoprolol tartrate (LOPRESSOR) 25 MG tablet TAKE HALF A TABLET BY MOUTH 2 TIMES DAILY. 90 tablet 3   nitroGLYCERIN (NITROSTAT) 0.4 MG SL tablet Place 1 tablet (0.4 mg total) under the tongue every 5 (five) minutes as needed. 25 tablet 3   palbociclib (IBRANCE) 75 MG tablet Take 1 tablet (75 mg total) by mouth daily. Take for 21 days on, 7 days off, repeat every 28 days. 21 tablet 6   No current facility-administered medications for this visit.     PHYSICAL EXAMINATION: Performance status (ECOG): 1 - Symptomatic but completely ambulatory  There were no vitals filed for this visit. Wt Readings from Last 3 Encounters:  03/27/22 148 lb 6.4 oz (67.3 kg)  01/30/22 151 lb 3.2 oz (68.6 kg)  01/07/22 152   lb 6.4 oz (69.1 kg)   Physical Exam Vitals reviewed.  Constitutional:      Appearance: Normal appearance.  Cardiovascular:     Rate and Rhythm: Normal rate and regular rhythm.     Pulses: Normal pulses.     Heart sounds: Normal heart sounds.  Pulmonary:     Effort: Pulmonary effort is normal.     Breath sounds: Normal breath sounds.  Neurological:     General: No focal deficit present.     Mental Status: She is alert and oriented to person, place, and time.  Psychiatric:        Mood and Affect: Mood normal.        Behavior: Behavior  normal.     Breast Exam Chaperone: Kirstyn Evans     LABORATORY DATA:  I have reviewed the data as listed    Latest Ref Rng & Units 04/25/2022    8:47 AM 03/27/2022    9:00 AM 02/27/2022    8:30 AM  CMP  Glucose 70 - 99 mg/dL 79  94  110   BUN 8 - 23 mg/dL 12  13  15   Creatinine 0.44 - 1.00 mg/dL 0.73  0.73  0.81   Sodium 135 - 145 mmol/L 139  139  138   Potassium 3.5 - 5.1 mmol/L 4.5  4.5  3.9   Chloride 98 - 111 mmol/L 105  105  105   CO2 22 - 32 mmol/L 27  28  26   Calcium 8.9 - 10.3 mg/dL 9.1  8.9  9.0   Total Protein 6.5 - 8.1 g/dL 7.2  7.1  7.6   Total Bilirubin 0.3 - 1.2 mg/dL 0.6  0.6  0.5   Alkaline Phos 38 - 126 U/L 37  37  36   AST 15 - 41 U/L 17  17  18   ALT 0 - 44 U/L 9  9  9    Lab Results  Component Value Date   CAN153 30.4 (H) 04/25/2022   CAN153 26.8 (H) 01/28/2022   CAN153 31.6 (H) 12/06/2021   Lab Results  Component Value Date   WBC 3.9 (L) 04/25/2022   HGB 12.1 04/25/2022   HCT 36.6 04/25/2022   MCV 106.7 (H) 04/25/2022   PLT 139 (L) 04/25/2022   NEUTROABS 1.6 (L) 04/25/2022    ASSESSMENT:  1.  T2N0 left breast infiltrating lobular carcinoma: -Status post lumpectomy in June 2014, 1.4 cm, grade 1, lymphovascular invasion positive, ER 100%, PR 26%, Ki-67 18%. -She underwent XRT.  She did not take adjuvant endocrine therapy. -Mammogram on 07/13/2020, BI-RADS Category 1. -CEA was 10.3, Ca1 2515.8 and CA 19-9 05. -PET scan on 08/07/2020 shows widespread hypermetabolic bone meta stasis with soft tissue components at T1 and sacrum.  Thoracic nodal metastasis. -MRI of the thoracic spine on 08/15/2020 shows pathological fracture of T1 with mild osseous retropulsion and possible left C8 nerve root encroachment within the foramen at C7-T1.  No cord deformity. -Right third rib biopsy on 08/22/2020 consistent with metastatic breast cancer, ER 100% positive, PR 40% positive, Ki-67 10%, HER-2 2+ and negative by FISH. -Ibrance 100 mg 3 weeks on 1 week off on  anastrozole 1 mg daily started on 08/31/2020. -Ibrance held on 10/13/2020 secondary to severe tiredness. - PET scan on 02/19/2021 shows improvement in metastatic disease.   2.  Social/family history: -She quit smoking 20 years ago, smoked 2 packs/day for more than 20 years prior to quitting. -She lives at   home by herself and is independent of all activities. -Daughter who has multiple myeloma at age 52.     PLAN:  1.  Metastatic cancer to the bones, highly likely breast cancer: - She is tolerating anastrozole very well. - She is tolerating Ibrance 75 mg 3 weeks on/1 week off very well. - Reviewed labs from 04/25/2022 which showed normal LFTs.  CBC shows leukopenia with ANC of 1.6.  CA 15-3 and CA 27-29 are elevated mildly and stable. - Reviewed PET scan from 04/26/2022: No clear evidence of active metastatic disease.  Stable treated bone mets. - She has chronic left axillary rib pain which is stable.  No infections. - Continue anastrozole daily and Ibrance 75 mg 3 weeks on/1 week off.  Recommend follow-up in 3 months with repeat labs including tumor markers.   2.  CAD and RCA stenting: - Continue metoprolol and aspirin.   3.  Right shoulder pain/low back pain: - She has occasional right shoulder pain.  I have recommended follow-up with orthopedics.   4.  Bone metastasis: - Calcium is 9.1.  Continue denosumab today and monthly.   5.  Depression/anxiety: - Continue Lexapro daily.   6.  Diarrhea: - Continue Imodium as needed.  Breast Cancer therapy associated bone loss: I have recommended calcium, Vitamin D and weight bearing exercises.  Orders placed this encounter:  No orders of the defined types were placed in this encounter.   The patient has a good understanding of the overall plan. She agrees with it. She will call with any problems that may develop before the next visit here.   , MD [facility] [phone]   I, Kirstyn Evans, am acting as  a scribe for Dr.  .  I,   MD, have reviewed the above documentation for accuracy and completeness, and I agree with the above.     

## 2022-05-06 NOTE — Patient Instructions (Addendum)
[place] at [facility] Discharge Instructions   You were seen and examined today by Dr. Delton Coombes.  He reviewed the results of your lab work and PET scan - all results are normal/stable.   We will see you back in 3 months with repeat lab work.    Thank you for choosing [place] at [facility] to provide your oncology and hematology care.  To afford each patient quality time with our provider, please arrive at least 15 minutes before your scheduled appointment time.   If you have a lab appointment with the [facility] please come in thru the Main Entrance and check in at the main information desk.  You need to re-schedule your appointment should you arrive 10 or more minutes late.  We strive to give you quality time with our providers, and arriving late affects you and other patients whose appointments are after yours.  Also, if you no show three or more times for appointments you may be dismissed from the clinic at the providers discretion.     Again, thank you for choosing [facility].  Our hope is that these requests will decrease the amount of time that you wait before being seen by our physicians.       _____________________________________________________________  Should you have questions after your visit to [facility], please contact our office at [phone] and follow the prompts.  Our office hours are 8:00 a.m. and 4:30 p.m. Monday - Friday.  Please note that voicemails left after 4:00 p.m. may not be returned until the following business day.  We are closed weekends and major holidays.  You do have access to a nurse 24-7, just call the main number to the clinic [phone] and do not press any options, hold on the line and a nurse will answer the phone.    For prescription refill requests, have your pharmacy contact our office and allow 72 hours.    Due to Covid, you will need to wear a mask upon  entering the hospital. If you do not have a mask, a mask will be given to you at the Main Entrance upon arrival. For doctor visits, patients may have 1 support person age 29 or older with them. For treatment visits, patients can not have anyone with them due to social distancing guidelines and our immunocompromised population.

## 2022-05-06 NOTE — Progress Notes (Signed)
Patient is taking Anastrozole and Ibrance as prescribed.  She has not missed any doses and reports no side effects at this time.

## 2022-05-13 ENCOUNTER — Other Ambulatory Visit (HOSPITAL_COMMUNITY): Payer: Self-pay

## 2022-05-23 ENCOUNTER — Other Ambulatory Visit (HOSPITAL_COMMUNITY): Payer: Self-pay

## 2022-05-24 ENCOUNTER — Inpatient Hospital Stay (HOSPITAL_COMMUNITY): Payer: Medicare Other

## 2022-05-24 ENCOUNTER — Encounter (HOSPITAL_COMMUNITY): Payer: Self-pay

## 2022-05-24 VITALS — BP 115/46 | HR 90 | Temp 98.2°F | Resp 18

## 2022-05-24 DIAGNOSIS — Z170 Estrogen receptor positive status [ER+]: Secondary | ICD-10-CM | POA: Diagnosis not present

## 2022-05-24 DIAGNOSIS — C50512 Malignant neoplasm of lower-outer quadrant of left female breast: Secondary | ICD-10-CM | POA: Diagnosis not present

## 2022-05-24 DIAGNOSIS — F32A Depression, unspecified: Secondary | ICD-10-CM | POA: Diagnosis not present

## 2022-05-24 DIAGNOSIS — Z79811 Long term (current) use of aromatase inhibitors: Secondary | ICD-10-CM | POA: Diagnosis not present

## 2022-05-24 DIAGNOSIS — C7951 Secondary malignant neoplasm of bone: Secondary | ICD-10-CM | POA: Diagnosis not present

## 2022-05-24 DIAGNOSIS — I2510 Atherosclerotic heart disease of native coronary artery without angina pectoris: Secondary | ICD-10-CM | POA: Diagnosis not present

## 2022-05-24 DIAGNOSIS — C50212 Malignant neoplasm of upper-inner quadrant of left female breast: Secondary | ICD-10-CM

## 2022-05-24 DIAGNOSIS — Z853 Personal history of malignant neoplasm of breast: Secondary | ICD-10-CM

## 2022-05-24 LAB — COMPREHENSIVE METABOLIC PANEL
ALT: 10 U/L (ref 0–44)
AST: 16 U/L (ref 15–41)
Albumin: 3.8 g/dL (ref 3.5–5.0)
Alkaline Phosphatase: 33 U/L — ABNORMAL LOW (ref 38–126)
Anion gap: 7 (ref 5–15)
BUN: 14 mg/dL (ref 8–23)
CO2: 27 mmol/L (ref 22–32)
Calcium: 8.8 mg/dL — ABNORMAL LOW (ref 8.9–10.3)
Chloride: 104 mmol/L (ref 98–111)
Creatinine, Ser: 0.84 mg/dL (ref 0.44–1.00)
GFR, Estimated: >60 mL/min (ref >60)
Glucose, Bld: 96 mg/dL (ref 70–99)
Potassium: 3.8 mmol/L (ref 3.5–5.1)
Sodium: 138 mmol/L (ref 135–145)
Total Bilirubin: 0.8 mg/dL (ref 0.3–1.2)
Total Protein: 7.3 g/dL (ref 6.5–8.1)

## 2022-05-24 MED ORDER — DENOSUMAB 120 MG/1.7ML ~~LOC~~ SOLN
120.0000 mg | Freq: Once | SUBCUTANEOUS | Status: AC
  Administered 2022-05-24: 120 mg via SUBCUTANEOUS
  Filled 2022-05-24: qty 1.7

## 2022-05-24 NOTE — Patient Instructions (Signed)
Kara Woods  Discharge Instructions: Thank you for choosing [facility] to provide your oncology and hematology care.  If you have a lab appointment with the [facility], please come in thru the Main Entrance and check in at the main information desk.  Wear comfortable clothing and clothing appropriate for easy access to any Portacath or PICC line.   We strive to give you quality time with your provider. You may need to reschedule your appointment if you arrive late (15 or more minutes).  Arriving late affects you and other patients whose appointments are after yours.  Also, if you miss three or more appointments without notifying the office, you may be dismissed from the clinic at the provider's discretion.      For prescription refill requests, have your pharmacy contact our office and allow 72 hours for refills to be completed.    Today you received the following Xgeva, return as scheduled. Denosumab injection What is this medication? DENOSUMAB (den oh sue mab) slows bone breakdown. Prolia is used to treat osteoporosis in women after menopause and in men, and in people who are taking corticosteroids for 6 months or more. Delton See is used to treat a high calcium level due to cancer and to prevent bone fractures and other bone problems caused by multiple myeloma or cancer bone metastases. Delton See is also used to treat giant cell tumor of the bone. This medicine may be used for other purposes; ask your health care provider or pharmacist if you have questions. COMMON BRAND NAME(S): Prolia, XGEVA What should I tell my care team before I take this medication? They need to know if you have any of these conditions: dental disease having surgery or tooth extraction infection kidney disease low levels of calcium or Vitamin D in the blood malnutrition on hemodialysis skin conditions or sensitivity thyroid or parathyroid disease an unusual reaction to denosumab, other  medicines, foods, dyes, or preservatives pregnant or trying to get pregnant breast-feeding How should I use this medication? This medicine is for injection under the skin. It is given by a health care professional in a hospital or clinic setting. A special MedGuide will be given to you before each treatment. Be sure to read this information carefully each time. For Prolia, talk to your pediatrician regarding the use of this medicine in children. Special care may be needed. For Delton See, talk to your pediatrician regarding the use of this medicine in children. While this drug may be prescribed for children as young as 13 years for selected conditions, precautions do apply. Overdosage: If you think you have taken too much of this medicine contact a poison control center or emergency room at once. NOTE: This medicine is only for you. Do not share this medicine with others. What if I miss a dose? It is important not to miss your dose. Call your doctor or health care professional if you are unable to keep an appointment. What may interact with this medication? Do not take this medicine with any of the following medications: other medicines containing denosumab This medicine may also interact with the following medications: medicines that lower your chance of fighting infection steroid medicines like prednisone or cortisone This list may not describe all possible interactions. Give your health care provider a list of all the medicines, herbs, non-prescription drugs, or dietary supplements you use. Also tell them if you smoke, drink alcohol, or use illegal drugs. Some items may interact with your medicine. What should I watch  for while using this medication? Visit your doctor or health care professional for regular checks on your progress. Your doctor or health care professional may order blood tests and other tests to see how you are doing. Call your doctor or health care professional for advice if you  get a fever, chills or sore throat, or other symptoms of a cold or flu. Do not treat yourself. This drug may decrease your body's ability to fight infection. Try to avoid being around people who are sick. You should make sure you get enough calcium and vitamin D while you are taking this medicine, unless your doctor tells you not to. Discuss the foods you eat and the vitamins you take with your health care professional. See your dentist regularly. Brush and floss your teeth as directed. Before you have any dental work done, tell your dentist you are receiving this medicine. Do not become pregnant while taking this medicine or for 5 months after stopping it. Talk with your doctor or health care professional about your birth control options while taking this medicine. Women should inform their doctor if they wish to become pregnant or think they might be pregnant. There is a potential for serious side effects to an unborn child. Talk to your health care professional or pharmacist for more information. What side effects may I notice from receiving this medication? Side effects that you should report to your doctor or health care professional as soon as possible: allergic reactions like skin rash, itching or hives, swelling of the face, lips, or tongue bone pain breathing problems dizziness jaw pain, especially after dental work redness, blistering, peeling of the skin signs and symptoms of infection like fever or chills; cough; sore throat; pain or trouble passing urine signs of low calcium like fast heartbeat, muscle cramps or muscle pain; pain, tingling, numbness in the hands or feet; seizures unusual bleeding or bruising unusually weak or tired Side effects that usually do not require medical attention (report to your doctor or health care professional if they continue or are bothersome): constipation diarrhea headache joint pain loss of appetite muscle pain runny nose tiredness upset  stomach This list may not describe all possible side effects. Call your doctor for medical advice about side effects. You may report side effects to FDA at 1-800-FDA-1088. Where should I keep my medication? This medicine is only given in a clinic, doctor's office, or other health care setting and will not be stored at home. NOTE: This sheet is a summary. It may not cover all possible information. If you have questions about this medicine, talk to your doctor, pharmacist, or health care provider.  2023 Elsevier/Gold Standard (2018-03-20 00:00:00)    To help prevent nausea and vomiting after your treatment, we encourage you to take your nausea medication as directed.  BELOW ARE SYMPTOMS THAT SHOULD BE REPORTED IMMEDIATELY: *FEVER GREATER THAN 100.4 F (38 C) OR HIGHER *CHILLS OR SWEATING *NAUSEA AND VOMITING THAT IS NOT CONTROLLED WITH YOUR NAUSEA MEDICATION *UNUSUAL SHORTNESS OF BREATH *UNUSUAL BRUISING OR BLEEDING *URINARY PROBLEMS (pain or burning when urinating, or frequent urination) *BOWEL PROBLEMS (unusual diarrhea, constipation, pain near the anus) TENDERNESS IN MOUTH AND THROAT WITH OR WITHOUT PRESENCE OF ULCERS (sore throat, sores in mouth, or a toothache) UNUSUAL RASH, SWELLING OR PAIN  UNUSUAL VAGINAL DISCHARGE OR ITCHING   Items with * indicate a potential emergency and should be followed up as soon as possible or go to the Emergency Department if any problems should occur.  Please  show the CHEMOTHERAPY ALERT CARD or IMMUNOTHERAPY ALERT CARD at check-in to the Emergency Department and triage nurse.  Should you have questions after your visit or need to cancel or reschedule your appointment, please contact [facility] [phone]  and follow the prompts.  Office hours are 8:00 a.m. to 4:30 p.m. Monday - Friday. Please note that voicemails left after 4:00 p.m. may not be returned until the following business day.  We are closed weekends and major holidays. You have  access to a nurse at all times for urgent questions. Please call the main number to the clinic [phone] and follow the prompts.  For any non-urgent questions, you may also contact your provider using MyChart. We now offer e-Visits for anyone 37 and older to request care online for non-urgent symptoms. For details visit [email or web address].   Also download the MyChart app! Go to the app store, search "MyChart", open the app, select [facility], and log in with your MyChart username and password.  Masks are optional in the cancer centers. If you would like for your care team to wear a mask while they are taking care of you, please let them know. For doctor visits, patients may have with them one support person who is at least 86 years old. At this time, visitors are not allowed in the infusion area.

## 2022-05-24 NOTE — Progress Notes (Signed)
Patient taking calcium as directed. Denied tooth, jaw, and leg pain. No recent or upcoming dental visits. Labs reviewed. Patient tolerated injection with no complaints voiced. See MAR for details. Patient stable during and after injection. Site clean and dry with no bruising or swelling noted. Band aid applied. Vss with discharge and left in satisfactory condition with no s/s of distress.  

## 2022-06-13 ENCOUNTER — Other Ambulatory Visit (HOSPITAL_COMMUNITY): Payer: Self-pay | Admitting: Hematology

## 2022-06-13 ENCOUNTER — Other Ambulatory Visit (HOSPITAL_COMMUNITY): Payer: Self-pay

## 2022-06-13 MED ORDER — PALBOCICLIB 75 MG PO TABS
75.0000 mg | ORAL_TABLET | Freq: Every day | ORAL | 6 refills | Status: DC
  Filled 2022-06-13: qty 21, 28d supply, fill #0
  Filled 2022-07-11: qty 21, 28d supply, fill #1
  Filled 2022-08-06: qty 21, 28d supply, fill #2

## 2022-06-18 ENCOUNTER — Other Ambulatory Visit (HOSPITAL_COMMUNITY): Payer: Self-pay

## 2022-06-19 ENCOUNTER — Other Ambulatory Visit (HOSPITAL_COMMUNITY): Payer: Self-pay | Admitting: *Deleted

## 2022-06-19 ENCOUNTER — Other Ambulatory Visit (HOSPITAL_COMMUNITY): Payer: Self-pay | Admitting: Hematology

## 2022-06-19 NOTE — Telephone Encounter (Signed)
Refill for Anastrozole approved.  Per last ovn, patient is tolerating and is to continue therapy.

## 2022-06-21 ENCOUNTER — Inpatient Hospital Stay (HOSPITAL_COMMUNITY): Payer: Medicare Other

## 2022-06-21 ENCOUNTER — Inpatient Hospital Stay (HOSPITAL_COMMUNITY): Payer: Medicare Other | Attending: Hematology

## 2022-06-21 VITALS — BP 126/77 | HR 84 | Temp 98.5°F | Resp 19

## 2022-06-21 DIAGNOSIS — F32A Depression, unspecified: Secondary | ICD-10-CM | POA: Insufficient documentation

## 2022-06-21 DIAGNOSIS — Z955 Presence of coronary angioplasty implant and graft: Secondary | ICD-10-CM | POA: Insufficient documentation

## 2022-06-21 DIAGNOSIS — Z79811 Long term (current) use of aromatase inhibitors: Secondary | ICD-10-CM | POA: Diagnosis not present

## 2022-06-21 DIAGNOSIS — Z79899 Other long term (current) drug therapy: Secondary | ICD-10-CM | POA: Diagnosis not present

## 2022-06-21 DIAGNOSIS — Z923 Personal history of irradiation: Secondary | ICD-10-CM | POA: Insufficient documentation

## 2022-06-21 DIAGNOSIS — Z87891 Personal history of nicotine dependence: Secondary | ICD-10-CM | POA: Diagnosis not present

## 2022-06-21 DIAGNOSIS — C7951 Secondary malignant neoplasm of bone: Secondary | ICD-10-CM | POA: Diagnosis not present

## 2022-06-21 DIAGNOSIS — C50512 Malignant neoplasm of lower-outer quadrant of left female breast: Secondary | ICD-10-CM | POA: Diagnosis not present

## 2022-06-21 DIAGNOSIS — F419 Anxiety disorder, unspecified: Secondary | ICD-10-CM | POA: Diagnosis not present

## 2022-06-21 DIAGNOSIS — Z170 Estrogen receptor positive status [ER+]: Secondary | ICD-10-CM | POA: Insufficient documentation

## 2022-06-21 DIAGNOSIS — Z853 Personal history of malignant neoplasm of breast: Secondary | ICD-10-CM

## 2022-06-21 DIAGNOSIS — I2510 Atherosclerotic heart disease of native coronary artery without angina pectoris: Secondary | ICD-10-CM | POA: Insufficient documentation

## 2022-06-21 LAB — COMPREHENSIVE METABOLIC PANEL
ALT: 10 U/L (ref 0–44)
AST: 21 U/L (ref 15–41)
Albumin: 3.8 g/dL (ref 3.5–5.0)
Alkaline Phosphatase: 34 U/L — ABNORMAL LOW (ref 38–126)
Anion gap: 8 (ref 5–15)
BUN: 14 mg/dL (ref 8–23)
CO2: 22 mmol/L (ref 22–32)
Calcium: 8.7 mg/dL — ABNORMAL LOW (ref 8.9–10.3)
Chloride: 107 mmol/L (ref 98–111)
Creatinine, Ser: 0.84 mg/dL (ref 0.44–1.00)
GFR, Estimated: >60 mL/min (ref >60)
Glucose, Bld: 140 mg/dL — ABNORMAL HIGH (ref 70–99)
Potassium: 4.1 mmol/L (ref 3.5–5.1)
Sodium: 137 mmol/L (ref 135–145)
Total Bilirubin: 0.6 mg/dL (ref 0.3–1.2)
Total Protein: 7 g/dL (ref 6.5–8.1)

## 2022-06-21 MED ORDER — DENOSUMAB 120 MG/1.7ML ~~LOC~~ SOLN
120.0000 mg | Freq: Once | SUBCUTANEOUS | Status: AC
  Administered 2022-06-21: 120 mg via SUBCUTANEOUS
  Filled 2022-06-21: qty 1.7

## 2022-06-21 NOTE — Progress Notes (Signed)
Patient presents today for Xgeva injection.  Patient did not feel well today.  Patient c/o nausea and dizziness upon standing.  She kept mentioning that she just "did not feel right".  She denied chest pain or SOB.  I asked if patient felt that she needed to be seen today and she refused.  She stated that she would go to urgent care. She was driving herself today and I offered to call someone to come pick her up.  She stated that she wanted to sit in the lobby to see if symptoms subsided.  She said she would call her daughter if she did not improve to come drive her.  Vital signs were all stable.  Calcium today is 8.7.  Patient states that she is taking calcium and vitamin D supplement daily.  We will proceed with injection per MD orders.  Patient tolerated injection with no complaints voiced.  Site clean and dry with no bruising or swelling noted.  No complaints of pain.  Discharged with vital signs stable and no signs or symptoms of distress noted.

## 2022-06-21 NOTE — Patient Instructions (Signed)
[facility]  Discharge Instructions: Thank you for choosing [facility] to provide your oncology and hematology care.  If you have a lab appointment with the Cancer Center, please come in thru the Main Entrance and check in at the main information desk.  Wear comfortable clothing and clothing appropriate for easy access to any Portacath or PICC line.   We strive to give you quality time with your provider. You may need to reschedule your appointment if you arrive late (15 or more minutes).  Arriving late affects you and other patients whose appointments are after yours.  Also, if you miss three or more appointments without notifying the office, you may be dismissed from the clinic at the provider's discretion.      For prescription refill requests, have your pharmacy contact our office and allow 72 hours for refills to be completed.    Today you received the following chemotherapy and/or immunotherapy agents Xgeva.  Denosumab injection What is this medication? DENOSUMAB (den oh sue mab) slows bone breakdown. Prolia is used to treat osteoporosis in women after menopause and in men, and in people who are taking corticosteroids for 6 months or more. Xgeva is used to treat a high calcium level due to cancer and to prevent bone fractures and other bone problems caused by multiple myeloma or cancer bone metastases. Xgeva is also used to treat giant cell tumor of the bone. This medicine may be used for other purposes; ask your health care provider or pharmacist if you have questions. COMMON BRAND NAME(S): Prolia, XGEVA What should I tell my care team before I take this medication? They need to know if you have any of these conditions: dental disease having surgery or tooth extraction infection kidney disease low levels of calcium or Vitamin D in the blood malnutrition on hemodialysis skin conditions or sensitivity thyroid or parathyroid disease an unusual reaction to  denosumab, other medicines, foods, dyes, or preservatives pregnant or trying to get pregnant breast-feeding How should I use this medication? This medicine is for injection under the skin. It is given by a health care professional in a hospital or clinic setting. A special MedGuide will be given to you before each treatment. Be sure to read this information carefully each time. For Prolia, talk to your pediatrician regarding the use of this medicine in children. Special care may be needed. For Xgeva, talk to your pediatrician regarding the use of this medicine in children. While this drug may be prescribed for children as young as 13 years for selected conditions, precautions do apply. Overdosage: If you think you have taken too much of this medicine contact a poison control center or emergency room at once. NOTE: This medicine is only for you. Do not share this medicine with others. What if I miss a dose? It is important not to miss your dose. Call your doctor or health care professional if you are unable to keep an appointment. What may interact with this medication? Do not take this medicine with any of the following medications: other medicines containing denosumab This medicine may also interact with the following medications: medicines that lower your chance of fighting infection steroid medicines like prednisone or cortisone This list may not describe all possible interactions. Give your health care provider a list of all the medicines, herbs, non-prescription drugs, or dietary supplements you use. Also tell them if you smoke, drink alcohol, or use illegal drugs. Some items may interact with your medicine. What should   I watch for while using this medication? Visit your doctor or health care professional for regular checks on your progress. Your doctor or health care professional may order blood tests and other tests to see how you are doing. Call your doctor or health care professional for  advice if you get a fever, chills or sore throat, or other symptoms of a cold or flu. Do not treat yourself. This drug may decrease your body's ability to fight infection. Try to avoid being around people who are sick. You should make sure you get enough calcium and vitamin D while you are taking this medicine, unless your doctor tells you not to. Discuss the foods you eat and the vitamins you take with your health care professional. See your dentist regularly. Brush and floss your teeth as directed. Before you have any dental work done, tell your dentist you are receiving this medicine. Do not become pregnant while taking this medicine or for 5 months after stopping it. Talk with your doctor or health care professional about your birth control options while taking this medicine. Women should inform their doctor if they wish to become pregnant or think they might be pregnant. There is a potential for serious side effects to an unborn child. Talk to your health care professional or pharmacist for more information. What side effects may I notice from receiving this medication? Side effects that you should report to your doctor or health care professional as soon as possible: allergic reactions like skin rash, itching or hives, swelling of the face, lips, or tongue bone pain breathing problems dizziness jaw pain, especially after dental work redness, blistering, peeling of the skin signs and symptoms of infection like fever or chills; cough; sore throat; pain or trouble passing urine signs of low calcium like fast heartbeat, muscle cramps or muscle pain; pain, tingling, numbness in the hands or feet; seizures unusual bleeding or bruising unusually weak or tired Side effects that usually do not require medical attention (report to your doctor or health care professional if they continue or are bothersome): constipation diarrhea headache joint pain loss of appetite muscle pain runny  nose tiredness upset stomach This list may not describe all possible side effects. Call your doctor for medical advice about side effects. You may report side effects to FDA at 1-800-FDA-1088. Where should I keep my medication? This medicine is only given in a clinic, doctor's office, or other health care setting and will not be stored at home. NOTE: This sheet is a summary. It may not cover all possible information. If you have questions about this medicine, talk to your doctor, pharmacist, or health care provider.  2023 Elsevier/Gold Standard (2018-03-20 00:00:00)       To help prevent nausea and vomiting after your treatment, we encourage you to take your nausea medication as directed.  BELOW ARE SYMPTOMS THAT SHOULD BE REPORTED IMMEDIATELY: *FEVER GREATER THAN 100.4 F (38 C) OR HIGHER *CHILLS OR SWEATING *NAUSEA AND VOMITING THAT IS NOT CONTROLLED WITH YOUR NAUSEA MEDICATION *UNUSUAL SHORTNESS OF BREATH *UNUSUAL BRUISING OR BLEEDING *URINARY PROBLEMS (pain or burning when urinating, or frequent urination) *BOWEL PROBLEMS (unusual diarrhea, constipation, pain near the anus) TENDERNESS IN MOUTH AND THROAT WITH OR WITHOUT PRESENCE OF ULCERS (sore throat, sores in mouth, or a toothache) UNUSUAL RASH, SWELLING OR PAIN  UNUSUAL VAGINAL DISCHARGE OR ITCHING   Items with * indicate a potential emergency and should be followed up as soon as possible or go to the Emergency Department if any   problems should occur.  Please show the CHEMOTHERAPY ALERT CARD or IMMUNOTHERAPY ALERT CARD at check-in to the Emergency Department and triage nurse.  Should you have questions after your visit or need to cancel or reschedule your appointment, please contact [facility] [phone]  and follow the prompts.  Office hours are 8:00 a.m. to 4:30 p.m. Monday - Friday. Please note that voicemails left after 4:00 p.m. may not be returned until the following business day.  We are closed weekends and  major holidays. You have access to a nurse at all times for urgent questions. Please call the main number to the clinic [phone] and follow the prompts.  For any non-urgent questions, you may also contact your provider using MyChart. We now offer e-Visits for anyone 18 and older to request care online for non-urgent symptoms. For details visit [email or web address].   Also download the MyChart app! Go to the app store, search "MyChart", open the app, select [facility], and log in with your MyChart username and password.  Masks are optional in the cancer centers. If you would like for your care team to wear a mask while they are taking care of you, please let them know. For doctor visits, patients may have with them one support person who is at least 86 years old. At this time, visitors are not allowed in the infusion area.  

## 2022-06-21 NOTE — Progress Notes (Signed)
Calcium 8.7. Patient presents today for Xgeva injection.

## 2022-07-11 ENCOUNTER — Other Ambulatory Visit (HOSPITAL_COMMUNITY): Payer: Self-pay

## 2022-07-15 ENCOUNTER — Other Ambulatory Visit (HOSPITAL_COMMUNITY): Payer: Self-pay

## 2022-07-15 DIAGNOSIS — N390 Urinary tract infection, site not specified: Secondary | ICD-10-CM | POA: Diagnosis not present

## 2022-07-15 DIAGNOSIS — T63301A Toxic effect of unspecified spider venom, accidental (unintentional), initial encounter: Secondary | ICD-10-CM | POA: Insufficient documentation

## 2022-07-15 DIAGNOSIS — R300 Dysuria: Secondary | ICD-10-CM | POA: Insufficient documentation

## 2022-07-15 DIAGNOSIS — T148XXA Other injury of unspecified body region, initial encounter: Secondary | ICD-10-CM | POA: Diagnosis not present

## 2022-07-19 ENCOUNTER — Inpatient Hospital Stay (HOSPITAL_COMMUNITY): Payer: Medicare Other | Attending: Hematology

## 2022-07-19 ENCOUNTER — Inpatient Hospital Stay: Payer: Medicare Other | Attending: Hematology

## 2022-07-19 VITALS — BP 136/83 | HR 83 | Temp 97.6°F | Resp 19

## 2022-07-19 DIAGNOSIS — Z79811 Long term (current) use of aromatase inhibitors: Secondary | ICD-10-CM | POA: Insufficient documentation

## 2022-07-19 DIAGNOSIS — Z955 Presence of coronary angioplasty implant and graft: Secondary | ICD-10-CM | POA: Diagnosis not present

## 2022-07-19 DIAGNOSIS — Z923 Personal history of irradiation: Secondary | ICD-10-CM | POA: Diagnosis not present

## 2022-07-19 DIAGNOSIS — Z79899 Other long term (current) drug therapy: Secondary | ICD-10-CM | POA: Diagnosis not present

## 2022-07-19 DIAGNOSIS — I2510 Atherosclerotic heart disease of native coronary artery without angina pectoris: Secondary | ICD-10-CM | POA: Insufficient documentation

## 2022-07-19 DIAGNOSIS — F419 Anxiety disorder, unspecified: Secondary | ICD-10-CM | POA: Insufficient documentation

## 2022-07-19 DIAGNOSIS — Z170 Estrogen receptor positive status [ER+]: Secondary | ICD-10-CM | POA: Insufficient documentation

## 2022-07-19 DIAGNOSIS — C50512 Malignant neoplasm of lower-outer quadrant of left female breast: Secondary | ICD-10-CM | POA: Diagnosis not present

## 2022-07-19 DIAGNOSIS — C7951 Secondary malignant neoplasm of bone: Secondary | ICD-10-CM | POA: Insufficient documentation

## 2022-07-19 DIAGNOSIS — Z87891 Personal history of nicotine dependence: Secondary | ICD-10-CM | POA: Diagnosis not present

## 2022-07-19 DIAGNOSIS — F32A Depression, unspecified: Secondary | ICD-10-CM | POA: Insufficient documentation

## 2022-07-19 DIAGNOSIS — Z853 Personal history of malignant neoplasm of breast: Secondary | ICD-10-CM

## 2022-07-19 LAB — CBC WITH DIFFERENTIAL/PLATELET
Abs Immature Granulocytes: 0.01 10*3/uL (ref 0.00–0.07)
Basophils Absolute: 0.1 10*3/uL (ref 0.0–0.1)
Basophils Relative: 1 %
Eosinophils Absolute: 0.1 10*3/uL (ref 0.0–0.5)
Eosinophils Relative: 2 %
HCT: 35.8 % — ABNORMAL LOW (ref 36.0–46.0)
Hemoglobin: 11.7 g/dL — ABNORMAL LOW (ref 12.0–15.0)
Immature Granulocytes: 0 %
Lymphocytes Relative: 48 %
Lymphs Abs: 1.7 10*3/uL (ref 0.7–4.0)
MCH: 35.2 pg — ABNORMAL HIGH (ref 26.0–34.0)
MCHC: 32.7 g/dL (ref 30.0–36.0)
MCV: 107.8 fL — ABNORMAL HIGH (ref 80.0–100.0)
Monocytes Absolute: 0.4 10*3/uL (ref 0.1–1.0)
Monocytes Relative: 10 %
Neutro Abs: 1.4 10*3/uL — ABNORMAL LOW (ref 1.7–7.7)
Neutrophils Relative %: 39 %
Platelets: 148 10*3/uL — ABNORMAL LOW (ref 150–400)
RBC: 3.32 MIL/uL — ABNORMAL LOW (ref 3.87–5.11)
RDW: 14.1 % (ref 11.5–15.5)
WBC: 3.6 10*3/uL — ABNORMAL LOW (ref 4.0–10.5)
nRBC: 0 % (ref 0.0–0.2)

## 2022-07-19 LAB — COMPREHENSIVE METABOLIC PANEL
ALT: 13 U/L (ref 0–44)
AST: 21 U/L (ref 15–41)
Albumin: 3.7 g/dL (ref 3.5–5.0)
Alkaline Phosphatase: 37 U/L — ABNORMAL LOW (ref 38–126)
Anion gap: 6 (ref 5–15)
BUN: 12 mg/dL (ref 8–23)
CO2: 27 mmol/L (ref 22–32)
Calcium: 8.6 mg/dL — ABNORMAL LOW (ref 8.9–10.3)
Chloride: 108 mmol/L (ref 98–111)
Creatinine, Ser: 0.79 mg/dL (ref 0.44–1.00)
GFR, Estimated: >60 mL/min (ref >60)
Glucose, Bld: 100 mg/dL — ABNORMAL HIGH (ref 70–99)
Potassium: 4.1 mmol/L (ref 3.5–5.1)
Sodium: 141 mmol/L (ref 135–145)
Total Bilirubin: 0.8 mg/dL (ref 0.3–1.2)
Total Protein: 7.1 g/dL (ref 6.5–8.1)

## 2022-07-19 MED ORDER — DENOSUMAB 120 MG/1.7ML ~~LOC~~ SOLN
120.0000 mg | Freq: Once | SUBCUTANEOUS | Status: AC
  Administered 2022-07-19: 120 mg via SUBCUTANEOUS
  Filled 2022-07-19: qty 1.7

## 2022-07-19 NOTE — Progress Notes (Signed)
Patient presents today for Xgeva injection. Calcium 8.6 today. Patient denies any jaw pain or major dental work coming up. Patient states she take Calcium supplements at home over the counter.   Kara Woods presents today for injection per the provider's orders.  Xgeva administration without incident; injection site WNL; see MAR for injection details.  Patient tolerated procedure well and without incident.  No questions or complaints noted at this time. Treatment given today per MD orders. Discharged from clinic ambulatory in stable condition. Alert and oriented x 3. F/U with [facility] as scheduled.

## 2022-07-19 NOTE — Patient Instructions (Signed)
[facility]  Discharge Instructions: Thank you for choosing [facility] to provide your oncology and hematology care.  If you have a lab appointment with the Cancer Center, please come in thru the Main Entrance and check in at the main information desk.  Wear comfortable clothing and clothing appropriate for easy access to any Portacath or PICC line.   We strive to give you quality time with your provider. You may need to reschedule your appointment if you arrive late (15 or more minutes).  Arriving late affects you and other patients whose appointments are after yours.  Also, if you miss three or more appointments without notifying the office, you may be dismissed from the clinic at the provider's discretion.      For prescription refill requests, have your pharmacy contact our office and allow 72 hours for refills to be completed.    Today you received the following chemotherapy and/or immunotherapy agents Xgeva.  Denosumab Injection (Oncology) What is this medication? DENOSUMAB (den oh SUE mab) prevents weakened bones caused by cancer. It may also be used to treat noncancerous bone tumors that cannot be removed by surgery. It can also be used to treat high calcium levels in the blood caused by cancer. It works by blocking a protein that causes bones to break down quickly. This slows down the release of calcium from bones, which lowers calcium levels in your blood. It also makes your bones stronger and less likely to break (fracture). This medicine may be used for other purposes; ask your health care provider or pharmacist if you have questions. COMMON BRAND NAME(S): XGEVA What should I tell my care team before I take this medication? They need to know if you have any of these conditions: Dental disease Having surgery or tooth extraction Infection Kidney disease Low levels of calcium or vitamin D in the blood Malnutrition On hemodialysis Skin conditions  or sensitivity Thyroid or parathyroid disease An unusual reaction to denosumab, other medications, foods, dyes, or preservatives Pregnant or trying to get pregnant Breast-feeding How should I use this medication? This medication is for injection under the skin. It is given by your care team in a hospital or clinic setting. A special MedGuide will be given to you before each treatment. Be sure to read this information carefully each time. Talk to your care team about the use of this medication in children. While it may be prescribed for children as young as 13 years for selected conditions, precautions do apply. Overdosage: If you think you have taken too much of this medicine contact a poison control center or emergency room at once. NOTE: This medicine is only for you. Do not share this medicine with others. What if I miss a dose? Keep appointments for follow-up doses. It is important not to miss your dose. Call your care team if you are unable to keep an appointment. What may interact with this medication? Do not take this medication with any of the following: Other medications containing denosumab This medication may also interact with the following: Medications that lower your chance of fighting infection Steroid medications, such as prednisone or cortisone This list may not describe all possible interactions. Give your health care provider a list of all the medicines, herbs, non-prescription drugs, or dietary supplements you use. Also tell them if you smoke, drink alcohol, or use illegal drugs. Some items may interact with your medicine. What should I watch for while using this medication? Your condition will   be monitored carefully while you are receiving this medication. You may need blood work while taking this medication. This medication may increase your risk of getting an infection. Call your care team for advice if you get a fever, chills, sore throat, or other symptoms of a cold or  flu. Do not treat yourself. Try to avoid being around people who are sick. You should make sure you get enough calcium and vitamin D while you are taking this medication, unless your care team tells you not to. Discuss the foods you eat and the vitamins you take with your care team. Some people who take this medication have severe bone, joint, or muscle pain. This medication may also increase your risk for jaw problems or a broken thigh bone. Tell your care team right away if you have severe pain in your jaw, bones, joints, or muscles. Tell your care team if you have any pain that does not go away or that gets worse. Talk to your care team if you may be pregnant. Serious birth defects can occur if you take this medication during pregnancy and for 5 months after the last dose. You will need a negative pregnancy test before starting this medication. Contraception is recommended while taking this medication and for 5 months after the last dose. Your care team can help you find the option that works for you. What side effects may I notice from receiving this medication? Side effects that you should report to your care team as soon as possible: Allergic reactions--skin rash, itching, hives, swelling of the face, lips, tongue, or throat Bone, joint, or muscle pain Low calcium level--muscle pain or cramps, confusion, tingling, or numbness in the hands or feet Osteonecrosis of the jaw--pain, swelling, or redness in the mouth, numbness of the jaw, poor healing after dental work, unusual discharge from the mouth, visible bones in the mouth Side effects that usually do not require medical attention (report to your care team if they continue or are bothersome): Cough Diarrhea Fatigue Headache Nausea This list may not describe all possible side effects. Call your doctor for medical advice about side effects. You may report side effects to FDA at 1-800-FDA-1088. Where should I keep my medication? This medication  is given in a hospital or clinic. It will not be stored at home. NOTE: This sheet is a summary. It may not cover all possible information. If you have questions about this medicine, talk to your doctor, pharmacist, or health care provider.  2023 Elsevier/Gold Standard (2022-04-01 00:00:00)        To help prevent nausea and vomiting after your treatment, we encourage you to take your nausea medication as directed.  BELOW ARE SYMPTOMS THAT SHOULD BE REPORTED IMMEDIATELY: *FEVER GREATER THAN 100.4 F (38 C) OR HIGHER *CHILLS OR SWEATING *NAUSEA AND VOMITING THAT IS NOT CONTROLLED WITH YOUR NAUSEA MEDICATION *UNUSUAL SHORTNESS OF BREATH *UNUSUAL BRUISING OR BLEEDING *URINARY PROBLEMS (pain or burning when urinating, or frequent urination) *BOWEL PROBLEMS (unusual diarrhea, constipation, pain near the anus) TENDERNESS IN MOUTH AND THROAT WITH OR WITHOUT PRESENCE OF ULCERS (sore throat, sores in mouth, or a toothache) UNUSUAL RASH, SWELLING OR PAIN  UNUSUAL VAGINAL DISCHARGE OR ITCHING   Items with * indicate a potential emergency and should be followed up as soon as possible or go to the Emergency Department if any problems should occur.  Please show the CHEMOTHERAPY ALERT CARD or IMMUNOTHERAPY ALERT CARD at check-in to the Emergency Department and triage nurse.  Should you have   questions after your visit or need to cancel or reschedule your appointment, please contact [place] [phone]  and follow the prompts.  Office hours are 8:00 a.m. to 4:30 p.m. Monday - Friday. Please note that voicemails left after 4:00 p.m. may not be returned until the following business day.  We are closed weekends and major holidays. You have access to a nurse at all times for urgent questions. Please call the main number to the clinic [phone] and follow the prompts.  For any non-urgent questions, you may also contact your provider using MyChart. We now offer e-Visits for anyone 2  and older to request care online for non-urgent symptoms. For details visit [email or web address].   Also download the MyChart app! Go to the app store, search "MyChart", open the app, select [place], and log in with your MyChart username and password.  Masks are optional in the cancer centers. If you would like for your care team to wear a mask while they are taking care of you, please let them know. You may have one support person who is at least 86 years old accompany you for your appointments.

## 2022-07-20 LAB — CANCER ANTIGEN 27.29: CA 27.29: 43.7 U/mL — ABNORMAL HIGH (ref 0.0–38.6)

## 2022-07-20 LAB — CANCER ANTIGEN 15-3: CA 15-3: 40 U/mL — ABNORMAL HIGH (ref 0.0–25.0)

## 2022-07-25 ENCOUNTER — Inpatient Hospital Stay (HOSPITAL_BASED_OUTPATIENT_CLINIC_OR_DEPARTMENT_OTHER): Payer: Medicare Other | Admitting: Hematology

## 2022-07-25 VITALS — BP 137/76 | HR 79 | Temp 98.0°F | Resp 18 | Wt 150.7 lb

## 2022-07-25 DIAGNOSIS — Z79811 Long term (current) use of aromatase inhibitors: Secondary | ICD-10-CM | POA: Insufficient documentation

## 2022-07-25 DIAGNOSIS — C50912 Malignant neoplasm of unspecified site of left female breast: Secondary | ICD-10-CM | POA: Insufficient documentation

## 2022-07-25 DIAGNOSIS — Z79899 Other long term (current) drug therapy: Secondary | ICD-10-CM | POA: Diagnosis not present

## 2022-07-25 DIAGNOSIS — Z7982 Long term (current) use of aspirin: Secondary | ICD-10-CM | POA: Insufficient documentation

## 2022-07-25 DIAGNOSIS — C7951 Secondary malignant neoplasm of bone: Secondary | ICD-10-CM | POA: Diagnosis not present

## 2022-07-25 DIAGNOSIS — C50212 Malignant neoplasm of upper-inner quadrant of left female breast: Secondary | ICD-10-CM | POA: Diagnosis not present

## 2022-07-25 DIAGNOSIS — Z170 Estrogen receptor positive status [ER+]: Secondary | ICD-10-CM

## 2022-07-25 MED ORDER — ESCITALOPRAM OXALATE 10 MG PO TABS: 10.0000 mg | ORAL_TABLET | Freq: Every day | ORAL | 3 refills | Status: DC

## 2022-07-25 NOTE — Progress Notes (Signed)
Kara Woods, Kara Woods   Patient Care Team: Celene Squibb, MD as PCP - General (Internal Medicine) Troy Sine, MD as PCP - Cardiology (Cardiology) Gala Romney Cristopher Estimable, MD (Gastroenterology) Brien Mates, RN as Oncology Nurse Navigator (Oncology)  SUMMARY OF ONCOLOGIC HISTORY: Oncology History   No history exists.    CHIEF COMPLIANT: Follow-up of left breast cancer   INTERVAL HISTORY: Kara Woods is a 88 y.o. female seen for follow-up of metastatic breast cancer.  She is tolerating Ibrance very well.  She reported pain in the right shoulder blade region for the last few weeks.  She is maintaining her weight.  Her daughter who is accompanying her thinks she is more depressed.  REVIEW OF SYSTEMS:   Review of Systems  Constitutional:  Negative for appetite change and fatigue.  Respiratory:  Positive for cough.   Gastrointestinal:  Positive for constipation and nausea.  Musculoskeletal:  Positive for arthralgias (R shoulder). Negative for neck pain.  Neurological:  Negative for dizziness and numbness.  Psychiatric/Behavioral:  Positive for sleep disturbance.   All other systems reviewed and are negative.   I have reviewed the past medical history, past surgical history, social history and family history with the patient and they are unchanged from previous note.   ALLERGIES:   is allergic to contrast media [iodinated contrast media], lipitor [atorvastatin], sulfamethoxazole, and sulfonamide derivatives.   MEDICATIONS:  Current Outpatient Medications  Medication Sig Dispense Refill   acetaminophen (TYLENOL) 325 MG tablet Take 2 tablets (650 mg total) by mouth every 6 (six) hours as needed for mild pain or headache (or temp > 37.5 C (99.5 F)). 40 tablet 0   anastrozole (ARIMIDEX) 1 MG tablet TAKE 1 TABLET BY MOUTH EVERY DAY 90 tablet 2   aspirin 81 MG EC tablet Take 1 tablet (81 mg total) by mouth daily. Swallow whole. 60  tablet 2   azithromycin (ZITHROMAX) 250 MG tablet      carboxymethylcellulose (REFRESH PLUS) 0.5 % SOLN Place 3-4 drops into both eyes 3 (three) times daily as needed (dry eyes). Preservative free     clorazepate (TRANXENE) 15 MG tablet Take 1 tablet (15 mg total) by mouth daily. 90 tablet 0   escitalopram (LEXAPRO) 10 MG tablet Take 1 tablet (10 mg total) by mouth daily. Take along with 92m tablet for total dose of 338mdaily 30 tablet 3   escitalopram (LEXAPRO) 20 MG tablet TAKE 1 TABLET BY MOUTH EVERY DAY 90 tablet 2   HYDROcodone-acetaminophen (NORCO) 5-325 MG tablet Take 1 tablet by mouth every 4 (four) hours as needed for moderate pain. 180 tablet 0   latanoprost (XALATAN) 0.005 % ophthalmic solution Place 1 drop into both eyes at bedtime.     metoprolol tartrate (LOPRESSOR) 25 MG tablet TAKE HALF A TABLET BY MOUTH 2 TIMES DAILY. 90 tablet 3   nitroGLYCERIN (NITROSTAT) 0.4 MG SL tablet Place 1 tablet (0.4 mg total) under the tongue every 5 (five) minutes as needed. 25 tablet 3   palbociclib (IBRANCE) 75 MG tablet Take 1 tablet (75 mg total) by mouth daily. Take for 21 days on, 7 days off, repeat every 28 days. 21 tablet 6   No current facility-administered medications for this visit.     PHYSICAL EXAMINATION: Performance status (ECOG): 1 - Symptomatic but completely ambulatory  Vitals:   07/25/22 1153  BP: 137/76  Pulse: 79  Resp: 18  Temp: 98 F (36.7 C)  SpO2: 96%   Wt Readings from Last 3 Encounters:  07/25/22 150 lb 11.2 oz (68.4 kg)  05/06/22 150 lb 2.1 oz (68.1 kg)  03/27/22 148 lb 6.4 oz (67.3 kg)   Physical Exam Vitals reviewed.  Constitutional:      Appearance: Normal appearance.  Cardiovascular:     Rate and Rhythm: Normal rate and regular rhythm.     Pulses: Normal pulses.     Heart sounds: Normal heart sounds.  Pulmonary:     Effort: Pulmonary effort is normal.     Breath sounds: Normal breath sounds.  Neurological:     General: No focal deficit  present.     Mental Status: She is alert and oriented to person, place, and time.  Psychiatric:        Mood and Affect: Mood normal.        Behavior: Behavior normal.     Breast Exam Chaperone: Thana Ates     LABORATORY DATA:  I have reviewed the data as listed    Latest Ref Rng & Units 07/19/2022    9:45 AM 06/21/2022    9:27 AM 05/24/2022    8:35 AM  CMP  Glucose 70 - 99 mg/dL 100  140  96   BUN 8 - 23 mg/dL 12  14  14    Creatinine 0.44 - 1.00 mg/dL 0.79  0.84  0.84   Sodium 135 - 145 mmol/L 141  137  138   Potassium 3.5 - 5.1 mmol/L 4.1  4.1  3.8   Chloride 98 - 111 mmol/L 108  107  104   CO2 22 - 32 mmol/L 27  22  27    Calcium 8.9 - 10.3 mg/dL 8.6  8.7  8.8   Total Protein 6.5 - 8.1 g/dL 7.1  7.0  7.3   Total Bilirubin 0.3 - 1.2 mg/dL 0.8  0.6  0.8   Alkaline Phos 38 - 126 U/L 37  34  33   AST 15 - 41 U/L 21  21  16    ALT 0 - 44 U/L 13  10  10     Lab Results  Component Value Date   CAN153 40.0 (H) 07/19/2022   CAN153 30.4 (H) 04/25/2022   CAN153 26.8 (H) 01/28/2022   Lab Results  Component Value Date   WBC 3.6 (L) 07/19/2022   HGB 11.7 (L) 07/19/2022   HCT 35.8 (L) 07/19/2022   MCV 107.8 (H) 07/19/2022   PLT 148 (L) 07/19/2022   NEUTROABS 1.4 (L) 07/19/2022    ASSESSMENT:  1.  T2N0 left breast infiltrating lobular carcinoma: -Status post lumpectomy in June 2014, 1.4 cm, grade 1, lymphovascular invasion positive, ER 100%, PR 26%, Ki-67 18%. -She underwent XRT.  She did not take adjuvant endocrine therapy. -Mammogram on 07/13/2020, BI-RADS Category 1. -CEA was 10.3, Ca1 2515.8 and CA 19-9 05. -PET scan on 08/07/2020 shows widespread hypermetabolic bone meta stasis with soft tissue components at T1 and sacrum.  Thoracic nodal metastasis. -MRI of the thoracic spine on 08/15/2020 shows pathological fracture of T1 with mild osseous retropulsion and possible left C8 nerve root encroachment within the foramen at C7-T1.  No cord deformity. -Right third rib biopsy on  08/22/2020 consistent with metastatic breast cancer, ER 100% positive, PR 40% positive, Ki-67 10%, HER-2 2+ and negative by FISH. -Ibrance 100 mg 3 weeks on 1 week off on anastrozole 1 mg daily started on 08/31/2020. -Ibrance held on 10/13/2020 secondary to severe tiredness. - PET scan on 02/19/2021  shows improvement in metastatic disease.   2.  Social/family history: -She quit smoking 20 years ago, smoked 2 packs/day for more than 20 years prior to quitting. -She lives at home by herself and is independent of all activities. -Daughter who has multiple myeloma at age 43.     PLAN:  1.  Metastatic cancer to the bones, highly likely breast cancer: - PET scan from 04/26/2022 with no clearance evidence of metastatic disease. - Reviewed labs today which showed normal LFTs and creatinine and calcium.  CBC shows mild leukopenia consistent with Ibrance. - Continue Ibrance and anastrozole. - Tumor markers CA 15-3 and CA 27-29 are mildly elevated at this time. - Recommend follow-up in 2 months with repeat tumor markers.  I will plan to repeat PET scan at that time.   2.  CAD and RCA stenting: - Continue metoprolol and aspirin.   3.  Right shoulder pain/low back pain: - Right shoulder blade pain is stable.  If any worsening, recommend orthopedics consult.   4.  Bone metastasis: - Calcium is 8.6.  Continue calcium supplements.  Continue denosumab monthly.   5.  Depression/anxiety: - She is taking Lexapro 20 mg daily.  She is also taking clorazepate as needed for anxiety.  She is still feeling depressed. - Will increase Lexapro to 30 mg daily.   6.  Diarrhea: - Continue Imodium as needed.  Breast Cancer therapy associated bone loss: I have recommended calcium, Vitamin D and weight bearing exercises.  Orders placed this encounter:  Orders Placed This Encounter  Procedures   NM PET Image Restag (PS) Skull Base To Thigh     The patient has a good understanding of the overall plan. She agrees  with it. She will call with any problems that may develop before the next visit here.  Derek Jack, MD [place] [phone]

## 2022-07-31 ENCOUNTER — Other Ambulatory Visit (HOSPITAL_COMMUNITY): Payer: Self-pay

## 2022-08-06 ENCOUNTER — Other Ambulatory Visit (HOSPITAL_COMMUNITY): Payer: Self-pay

## 2022-08-08 ENCOUNTER — Other Ambulatory Visit (HOSPITAL_COMMUNITY): Payer: Self-pay

## 2022-08-16 ENCOUNTER — Encounter (HOSPITAL_COMMUNITY): Payer: Self-pay | Admitting: Hematology

## 2022-08-16 ENCOUNTER — Other Ambulatory Visit (HOSPITAL_COMMUNITY): Payer: Self-pay

## 2022-08-16 ENCOUNTER — Telehealth: Payer: Self-pay

## 2022-08-16 NOTE — Telephone Encounter (Signed)
Oral Oncology Patient Advocate Encounter   Began application for assistance for Ibrance through [employer].   Application will be submitted upon completion of necessary supporting documentation.   [employer]'s phone number [phone].   I will continue to check the status until final determination.   Kara Woods, [facility] Patient [place]  [phone] (phone) [phone] (fax)

## 2022-08-16 NOTE — Telephone Encounter (Signed)
Oral Oncology Patient Advocate Encounter  Reached out and spoke with patient regarding PAP paperwork, explained that I would send it to their preferred email via [employer].   Confirmed email address: [email or web address].    Patient expressed understanding and consent.  Will follow up once paperwork has been signed and returned.   [name], [facility] Patient [place]  [phone] (phone) [phone] (fax)

## 2022-08-17 ENCOUNTER — Other Ambulatory Visit: Payer: Self-pay | Admitting: Hematology

## 2022-08-19 ENCOUNTER — Other Ambulatory Visit: Payer: Self-pay | Admitting: *Deleted

## 2022-08-19 ENCOUNTER — Encounter (HOSPITAL_COMMUNITY): Payer: Self-pay | Admitting: Hematology

## 2022-08-19 ENCOUNTER — Ambulatory Visit
Admission: EM | Admit: 2022-08-19 | Discharge: 2022-08-19 | Disposition: A | Discharge status: Home / Self Care | Payer: Medicare Other | Attending: Nurse Practitioner | Admitting: Nurse Practitioner

## 2022-08-19 DIAGNOSIS — N3001 Acute cystitis with hematuria: Secondary | ICD-10-CM | POA: Insufficient documentation

## 2022-08-19 DIAGNOSIS — R443 Hallucinations, unspecified: Secondary | ICD-10-CM

## 2022-08-19 LAB — POCT URINALYSIS DIP (MANUAL ENTRY)
Glucose, UA: NEGATIVE mg/dL
Nitrite, UA: NEGATIVE
Protein Ur, POC: 100 mg/dL — AB
Spec Grav, UA: >=1.03 — AB (ref 1.010–1.025)
Urobilinogen, UA: 0.2 E.U./dL
pH, UA: 5.5 (ref 5.0–8.0)

## 2022-08-19 MED ORDER — CEPHALEXIN 500 MG PO CAPS: 500.0000 mg | ORAL_CAPSULE | Freq: Two times a day (BID) | ORAL | 0 refills | Status: AC

## 2022-08-19 NOTE — Discharge Instructions (Addendum)
The urine sample today is suggestive of a urinary tract infection.  We are sending for a urine culture to confirm.  In the meantime, please start on the antibiotic and take as prescribed.  You will hear from [place] in a couple of days if the urine culture shows any sensitivities and if we need to change antibiotic choice we will let you know.  If you develop high fever, nausea/vomiting, unable to keep fluids down, please go the emergency room.

## 2022-08-19 NOTE — ED Provider Notes (Signed)
[facility]    CSN: [id number] Arrival date & time: 08/19/22  1616      History   Chief Complaint Chief Complaint  Patient presents with   Dysuria    HPI Kara Woods is a 88 y.o. female.   Patient presents with 2 daughters for 1 day of dysuria.  She also endorses urinary frequency, voiding small amounts.  She denies urgency, new urinary incontinence, change in the odor of her urine, blood in her urine.  No belly pain or new back pain.  No fever or nausea/vomiting.  Denies vaginal discharge.  Has not take anything for symptoms so far.  Reports she was treated about 1 month ago with her PCP for UTI with ciprofloxacin.  She denies other antibiotic use in the past 90 days.  Denies allergy to antibiotic.    Past Medical History:  Diagnosis Date   Anxiety    Arthritis    Blind left eye    Breast cancer ([place])    left    Colitis    Coronary artery disease    a. 10/2016 Staged PCI: RCA 50p, 62m(2.25x12 Resolute Onyx DES), LCX 50p, 869m2.75x23 Xience Alpine DES). Residual LAD 50p/m, 4589m   Cystocele 10/11/2013   Depression    Diastolic dysfunction    a. 09/2016 Echo: EF 50%, diffuse hypokinesis, mild LVH, grade 1 diastolic dysfunction, mild mitral regurgitation, mildly dilated left atrium.   GERD (gastroesophageal reflux disease)    occasional Tums only   HOH (hard of hearing)    Pelvic relaxation 10/11/2013   Proctitis    colonsocopy 2007, Canasa suppositories   S/P endoscopy March 2009   mild erosive reflux esophagitis, Schatzki's ring, s/p dilation   Schatzki's ring    Shortness of breath    occ if anxiety   Wears dentures    upper   Wears glasses     Patient Active Problem List   Diagnosis Date Noted   TIA (transient ischemic attack) 07/24/2020   Dizziness 11/19/2016   CAD S/P percutaneous coronary angioplasty 11/06/2016   Essential hypertension 11/06/2016   [place]/09[place]   S/P vaginal hysterectomy 05/17/2014   Cystocele  10/11/2013   Pelvic relaxation 10/11/2013   History of breast cancer 04/20/2013   After cataract, bilateral 04/07/2013   Corneal edema 04/07/2013   Dry eyes 04/07/2013   Diarrhea 06/23/2012   Schatzki's ring 07/16/2011   COLLES' FRACTURE, LEFT 04/04/2010   Chest pain in adult 09/18/2009   Dyslipidemia, goal LDL below 70 07/28/2008   ANXIETY 07/28/2008   MACULAR DEGENERATION, BILATERAL 07/28/2008   GERD 07/28/2008   PROCTITIS 07/28/2008   OSTEOPENIA 07/28/2008   RETINAL DETACHMENT, LEFT EYE, HX OF 07/28/2008    Past Surgical History:  Procedure Laterality Date   ABDOMINAL HYSTERECTOMY     ANTERIOR AND POSTERIOR REPAIR N/A 05/17/2014   Procedure: ANTERIOR (CYSTOCELE) AND POSTERIOR REPAIR (RECTOCELE);  Surgeon: ScoReece PackerD;  [facility];  Service: Urology;  Laterality: N/A;   APPENDECTOMY     BREAST LUMPECTOMY WITH NEEDLE LOCALIZATION AND AXILLARY SENTINEL LYMPH NODE BX Left 05/03/2013   Procedure: BREAST LUMPECTOMY WITH NEEDLE LOCALIZATION AND AXILLARY SENTINEL LYMPH NODE BX;  Surgeon: BenEdward JollyD;  [facility]  Laterality: Left;   BREAST SURGERY Left 05/19/13   CARDIAC CATHETERIZATION  1998   CARDIAC CATHETERIZATION N/A 11/04/2016   Procedure: Left Heart Cath and Coronary Angiography;  Surgeon: ThoTroy SineD;  Location: [facility];  Service: Cardiovascular;  Laterality: N/A;   CARDIAC CATHETERIZATION N/A 11/05/2016   Procedure: Coronary Stent Intervention;  Surgeon: Troy Sine, MD;  Location: [facility];  Service: Cardiovascular;  Laterality: N/A;   CHOLECYSTECTOMY     COLONOSCOPY  05/06/2006   Diffuse inflammatory changes of the rectal mucosa, consistent  with proctitis.  Otherwise, normal colon to terminal ileum   CORONARY ANGIOPLASTY  11/04/2016   CORONARY STENT PLACEMENT     Drug-eluting coronary artery stent, non-bioabsorbable-polymer-coated   CYSTOSCOPY N/A 05/17/2014   Procedure:  CYSTOSCOPY;  Surgeon: Reece Packer, MD;  Location: [facility];  Service: Urology;  Laterality: N/A;   ESOPHAGOGASTRODUODENOSCOPY  02/09/2008   Schatzki ring status post dilation/Distal esophageal erosion consistent with mild erosive reflux  esophagitis, otherwise unremarkable esophagus, normal stomach, D1, D2.   EYE SURGERY     lt cataract-implant   EYE SURGERY     lt-lens repaced,l   LUMBAR DISC SURGERY  1993   RE-EXCISION OF BREAST LUMPECTOMY Left 05/19/2013   Procedure: RE-EXCISION OF BREAST LUMPECTOMY;  Surgeon: Edward Jolly, MD;  Location: [place] [place];  Service: General;  Laterality: Left;   RETINAL DETACHMENT SURGERY  1978   Left   VAGINAL HYSTERECTOMY Bilateral 05/17/2014   Procedure: HYSTERECTOMY VAGINAL with Bilateral Salpingo-Oophorectomy; Bladder cystotomy repair;  Surgeon: Marvene Staff, MD;  Location: [facility];  Service: Gynecology;  Laterality: Bilateral;   VAGINAL PROLAPSE REPAIR N/A 05/17/2014   Procedure: VAGINAL VAULT PROLAPSE AND GRAFT;  Surgeon: Reece Packer, MD;  Location: [facility];  Service: Urology;  Laterality: N/A;    OB History     Gravida  6   Para  6   Term      Preterm      AB      Living  5      SAB      IAB      Ectopic      Multiple      Live Births  6            Home Medications    Prior to Admission medications   Medication Sig Start Date End Date Taking? Authorizing Provider  cephALEXin (KEFLEX) 500 MG capsule Take 1 capsule (500 mg total) by mouth 2 (two) times daily for 5 days. 08/19/22 08/24/22 Yes Eulogio Bear, NP  acetaminophen (TYLENOL) 325 MG tablet Take 2 tablets (650 mg total) by mouth every 6 (six) hours as needed for mild pain or headache (or temp > 37.5 C (99.5 F)). 10/20/20   Barton Dubois, MD  anastrozole (ARIMIDEX) 1 MG tablet TAKE 1 TABLET BY MOUTH EVERY DAY 06/19/22   Derek Jack, MD  aspirin 81 MG EC tablet Take 1 tablet (81 mg total) by mouth daily. Swallow whole.  09/17/21   Troy Sine, MD  azithromycin (ZITHROMAX) 250 MG tablet  03/11/22   [provider]  carboxymethylcellulose (REFRESH PLUS) 0.5 % SOLN Place 3-4 drops into both eyes 3 (three) times daily as needed (dry eyes). Preservative free    [provider]  clorazepate (TRANXENE) 15 MG tablet Take 1 tablet (15 mg total) by mouth daily. 01/02/21   Jacquelin Hawking, NP  escitalopram (LEXAPRO) 10 MG tablet TAKE 1 TABLET (10 MG TOTAL) BY MOUTH DAILY. TAKE ALONG WITH 20MG TABLET FOR TOTAL DOSE OF 30MG DAILY 08/19/22   Derek Jack, MD  HYDROcodone-acetaminophen (NORCO) 5-325 MG tablet Take 1 tablet by mouth  every 4 (four) hours as needed for moderate pain. 07/26/21   Derek Jack, MD  latanoprost (XALATAN) 0.005 % ophthalmic solution Place 1 drop into both eyes at bedtime.    [provider]  metoprolol tartrate (LOPRESSOR) 25 MG tablet TAKE HALF A TABLET BY MOUTH 2 TIMES DAILY. 04/18/22   Troy Sine, MD  nitroGLYCERIN (NITROSTAT) 0.4 MG SL tablet Place 1 tablet (0.4 mg total) under the tongue every 5 (five) minutes as needed. 11/06/16   Cheryln Manly, NP  palbociclib (IBRANCE) 75 MG tablet Take 1 tablet (75 mg total) by mouth daily. Take for 21 days on, 7 days off, repeat every 28 days. 06/13/22   Derek Jack, MD    Family History Family History  Problem Relation Age of Onset   Cancer Brother        spinal   Heart attack Mother    Heart attack Father    Heart attack Son    Heart attack Son    Depression Son    Multiple myeloma Daughter    Anemia Daughter    Colon cancer Neg Hx     Social History Social History   Tobacco Use   Smoking status: Former    Packs/day: 1.00    Types: Cigarettes    Quit date: 04/28/1990    Years since quitting: 32.3   Smokeless tobacco: Never   Tobacco comments:    quit 28 yrs ago  Substance Use Topics   Alcohol use: Yes    Alcohol/week: 1.0 standard drink of alcohol    Types: 1 Glasses of wine  per week    Comment: occ   Drug use: No     Allergies   Contrast media [iodinated contrast media], Lipitor [atorvastatin], Sulfamethoxazole, and Sulfonamide derivatives   Review of Systems Review of Systems Per HPI  Physical Exam Triage Vital Signs ED Triage Vitals  Enc Vitals Group     BP 08/19/22 1653 95/63     Pulse Rate 08/19/22 1653 90     Resp 08/19/22 1653 20     Temp 08/19/22 1653 (!) 97.5 F (36.4 C)     Temp src --      SpO2 08/19/22 1653 95 %     Weight --      Height --      Head Circumference --      Peak Flow --      Pain Score 08/19/22 1651 0     Pain Loc --      Pain Edu? --      Excl. in [place]? --    No data found.  Updated Vital Signs BP 95/63   Pulse 90   Temp (!) 97.5 F (36.4 C)   Resp 20   SpO2 95%   Visual Acuity Right Eye Distance:   Left Eye Distance:   Bilateral Distance:    Right Eye Near:   Left Eye Near:    Bilateral Near:     Physical Exam Vitals and nursing note reviewed.  Constitutional:      General: She is not in acute distress.    Appearance: She is not toxic-appearing.  Eyes:     Comments: Blind in left eye  Cardiovascular:     Rate and Rhythm: Normal rate and regular rhythm.  Pulmonary:     Effort: Pulmonary effort is normal. No respiratory distress.     Breath sounds: No wheezing, rhonchi or rales.  Abdominal:     General: Abdomen  is flat. Bowel sounds are normal. There is no distension.     Palpations: Abdomen is soft. There is no mass.     Tenderness: There is no abdominal tenderness. There is no right CVA tenderness, left CVA tenderness or guarding.  Skin:    General: Skin is warm and dry.     Coloration: Skin is not jaundiced or pale.     Findings: No erythema.  Neurological:     Mental Status: She is alert and oriented to person, place, and time.  Psychiatric:        Behavior: Behavior is cooperative.      UC Treatments / Results  Labs (all labs ordered are listed, but only abnormal results are  displayed) Labs Reviewed  POCT URINALYSIS DIP (MANUAL ENTRY) - Abnormal; Notable for the following components:      Result Value   Bilirubin, UA large (*)    Ketones, POC UA small (15) (*)    Spec Grav, UA >=1.030 (*)    Blood, UA large (*)    Protein Ur, POC =100 (*)    Leukocytes, UA Trace (*)    All other components within normal limits  URINE CULTURE    EKG   Radiology No results found.  Procedures Procedures (including critical care time)  Medications Ordered in UC Medications - No data to display  Initial Impression / Assessment and Plan / UC Course  I have reviewed the triage vital signs and the nursing notes.  Pertinent labs & imaging results that were available during my care of the patient were reviewed by me and considered in my medical decision making (see chart for details).    Patient is well-appearing, normotensive, afebrile, not tachycardic, not tachypneic, oxygenating well on room air.  Urinalysis today shows elevated specific gravity, large amount of blood, trace leukocytes.  Will send urine for culture, in meantime treat with Keflex 500 mg twice daily for 5 days.  ER and return precautions discussed.  The patient was given the opportunity to ask questions.  All questions answered to their satisfaction.  The patient is in agreement to this plan.    Final Clinical Impressions(s) / UC Diagnoses   Final diagnoses:  Acute cystitis with hematuria     Discharge Instructions      The urine sample today is suggestive of a urinary tract infection.  We are sending for a urine culture to confirm.  In the meantime, please start on the antibiotic and take as prescribed.  You will hear from [place] in a couple of days if the urine culture shows any sensitivities and if we need to change antibiotic choice we will let you know.  If you develop high fever, nausea/vomiting, unable to keep fluids down, please go the emergency room.     ED Prescriptions     Medication  Sig Dispense Auth. Provider   cephALEXin (KEFLEX) 500 MG capsule Take 1 capsule (500 mg total) by mouth 2 (two) times daily for 5 days. 10 capsule Eulogio Bear, NP      PDMP not reviewed this encounter.   Eulogio Bear, NP 08/19/22 [phone]

## 2022-08-19 NOTE — ED Triage Notes (Signed)
Pt presents with c/o dysuria that began yesterday

## 2022-08-19 NOTE — Progress Notes (Addendum)
Patient's daughter Maudie Mercury called and states that she has started having hallucinations over the last 24-48 hours, to include moving furniture stating that she is seeing people under her furniture.  [place]aming [place].  She refuses to go to the ER.  Per Dr Delton Coombes a STAT MRI of brain with and without contrast will be done.  We will decrease her lexapro to 20 mg from 30 mg in the meantime.  Daughter aware.  Advised that they seek immediate medical attention if symptoms worsen.  Verbalized understanding.

## 2022-08-20 ENCOUNTER — Ambulatory Visit (HOSPITAL_COMMUNITY)
Admission: RE | Admit: 2022-08-20 | Discharge: 2022-08-20 | Disposition: A | Discharge status: Home / Self Care | Payer: Medicare Other | Source: Ambulatory Visit | Attending: Hematology | Admitting: Hematology

## 2022-08-20 DIAGNOSIS — R443 Hallucinations, unspecified: Secondary | ICD-10-CM | POA: Insufficient documentation

## 2022-08-20 DIAGNOSIS — N390 Urinary tract infection, site not specified: Secondary | ICD-10-CM | POA: Diagnosis not present

## 2022-08-20 DIAGNOSIS — R410 Disorientation, unspecified: Secondary | ICD-10-CM | POA: Diagnosis not present

## 2022-08-20 DIAGNOSIS — G319 Degenerative disease of nervous system, unspecified: Secondary | ICD-10-CM | POA: Diagnosis not present

## 2022-08-20 LAB — URINE CULTURE: Culture: <10000 — AB

## 2022-08-20 MED ORDER — GADOPICLENOL 0.5 MMOL/ML IV SOLN
7.0000 mL | Freq: Once | INTRAVENOUS | Status: AC | PRN
  Administered 2022-08-20: 7 mL via INTRAVENOUS

## 2022-08-21 NOTE — Progress Notes (Signed)
Patient's daughter notified of results.  Dr, Delton Coombes has reviewed.

## 2022-08-22 NOTE — Telephone Encounter (Signed)
Oral Oncology Patient Advocate Encounter   Submitted application for assistance for Ibrance to [employer].   Application submitted via e-fax to [phone]   [employer]'s phone number [phone]   I will continue to check the status until final determination.   [name], [facility] Patient [place]  [phone] (phone) [phone] (fax)     Oral Oncology Patient Advocate Encounter   Began application for assistance for Ibrance through [employer].   Application will be submitted upon completion of necessary supporting documentation.   [employer]'s phone number [phone].   I will continue to check the status until final determination.   [name], [facility] Patient [place]  [phone] (phone) [phone] (fax)

## 2022-08-24 ENCOUNTER — Other Ambulatory Visit (HOSPITAL_COMMUNITY): Payer: Self-pay | Admitting: Hematology

## 2022-08-24 DIAGNOSIS — C50919 Malignant neoplasm of unspecified site of unspecified female breast: Secondary | ICD-10-CM

## 2022-08-24 DIAGNOSIS — Z170 Estrogen receptor positive status [ER+]: Secondary | ICD-10-CM

## 2022-08-26 ENCOUNTER — Encounter (HOSPITAL_COMMUNITY): Payer: Self-pay | Admitting: Hematology

## 2022-08-26 NOTE — Telephone Encounter (Signed)
Oral Oncology Patient Advocate Encounter   Received notification that the application for assistance for Ibrance through [place] has been approved.   [employer]'s phone number [phone].   Effective dates: 10.02.2023 through 12.31.2023  I have spoken to the patient.  [name], [facility] Patient [place]  [phone] (phone) [phone] (fax)

## 2022-09-02 ENCOUNTER — Encounter (HOSPITAL_COMMUNITY): Payer: Self-pay | Admitting: Hematology

## 2022-09-02 ENCOUNTER — Ambulatory Visit: Payer: Medicare Other | Admitting: Physician Assistant

## 2022-09-09 ENCOUNTER — Telehealth: Payer: Self-pay | Admitting: *Deleted

## 2022-09-09 NOTE — Chronic Care Management (AMB) (Signed)
  Care Coordination   Note   09/09/2022 Name: Kara Woods MRN: [id number] DOB: July 31, 1933  Kara Woods is a 88 y.o. year old female who sees Nevada Crane, Edwinna Areola, MD for primary care. I reached out to Kara Woods by phone today to offer care coordination services.  [name] was given information about Care Coordination services today including:   The Care Coordination services include support from the care team which includes your Nurse Coordinator, Clinical Social Worker, or Pharmacist.  The Care Coordination team is here to help remove barriers to the health concerns and goals most important to you. Care Coordination services are voluntary, and the patient may decline or stop services at any time by request to their care team member.   Care Coordination Consent Status: [name] [facility] on file agreed to services and verbal consent obtained.   Follow up plan:  Telephone appointment with care coordination team member scheduled for:  10/07/22  Encounter Outcome:  Pt. Scheduled   [place]  Direct Dial: [phone]

## 2022-09-16 ENCOUNTER — Ambulatory Visit (INDEPENDENT_AMBULATORY_CARE_PROVIDER_SITE_OTHER): Payer: Medicare Other | Admitting: Physician Assistant

## 2022-09-16 VITALS — BP 123/75 | HR 90 | Ht 64.0 in | Wt 150.0 lb

## 2022-09-16 DIAGNOSIS — R351 Nocturia: Secondary | ICD-10-CM | POA: Diagnosis not present

## 2022-09-16 DIAGNOSIS — N390 Urinary tract infection, site not specified: Secondary | ICD-10-CM

## 2022-09-16 DIAGNOSIS — Z8659 Personal history of other mental and behavioral disorders: Secondary | ICD-10-CM | POA: Diagnosis not present

## 2022-09-16 DIAGNOSIS — N3941 Urge incontinence: Secondary | ICD-10-CM

## 2022-09-16 LAB — POCT URINALYSIS DIPSTICK
Blood, UA: NEGATIVE
Glucose, UA: NEGATIVE
Leukocytes, UA: NEGATIVE
Nitrite, UA: NEGATIVE
Protein, UA: POSITIVE — AB
Spec Grav, UA: 1.025 (ref 1.010–1.025)
Urobilinogen, UA: 0.2 E.U./dL
pH, UA: 5 (ref 5.0–8.0)

## 2022-09-16 LAB — BLADDER SCAN AMB NON-IMAGING: Scan Result: 0

## 2022-09-16 MED ORDER — MIRABEGRON ER 25 MG PO TB24: 25.0000 mg | ORAL_TABLET | Freq: Every day | ORAL | 0 refills | Status: DC

## 2022-09-16 NOTE — Progress Notes (Signed)
09/16/2022 3:34 PM   Kara Woods Mar 08, 1933 [id number]   Assessment:  1. Urge incontinence of urine - POCT urinalysis dipstick - BLADDER SCAN AMB NON-IMAGING  2. Nocturia  3. Confusion, hx of, without neuro findings    Plan: Patient and her daughter reassured that her urine is clear today without evidence of infection.  I explained that cultures that are available in the epic system indicate no true infections and it is doubtful that the patient's occasional mental status changes just are due to to UTIs only.  She is alert and oriented x3 today without focal deficits or interaction that can phase altered mental status.  They are encouraged to keep neurology evaluation as scheduled in the next 2 weeks.  The patient's urgency and incontinence symptoms, will start a course of Myrbetriq 25 mg and samples given.  Meds ordered this encounter  Medications   mirabegron ER (MYRBETRIQ) 25 MG TB24 tablet    Sig: Take 1 tablet (25 mg total) by mouth daily.    Dispense:  28 tablet    Refill:  0      Chief Complaint: UTIs  Referring provider: Valentino Nose, FNP [address] [place],  [place] [address]   History of Present Illness:  Kara Woods is a 88 y.o. year old female who is seen for evaluation of mental status changes and "hallucinations" that pt's daughter feels may be from UTIs.  Patient reports a 1 year history of ongoing urinary frequency, urge incontinence, intermittent burning, recurrent UTIs.  She admits to occasional burning.  She denies gross hematuria and currently reports no fever, chills, abdominal pain, nausea vomiting.  Patient wears approximately 6 pads per day secondary to leakage of which she is usually unaware.  She voids 4-5 times per night and wears at least 2 pads at night.  Patient admits to ongoing fatigue during this time as well.  Her daughter who presents with the patient conveys concern that the patient becomes very confused with witnessed  hallucinations on occasion, especially at night.  They are concerned that UTIs are responsible for these mental status changes.  Patient admits to "washing up" in the genital area 4-5 times per day, but only bathes every few days.  She drinks a significant amount of iced tea and has 1 coffee and approximately 1 soda per day.  She admits to drinking only 4 to 5 cups of water per day.  She denies a history of stone disease and had only occasional UTIs in the past.  ED evaluation for UTI last month. Culture obtained on 08/20/2022 grew fewer than 10,000 colonies, insignificant growth. Cystocele repair with Dr. West Bali in 2015 History significant for left-sided breast cancer in the past.  UA=clear PVR=5m  Labs and imaging studies reviewed as well as hospital visit notes. Portions of the above documentation were copied from a prior visit for review purposes only. Medical records including notes, lab results, and imaging studies reviewed during pt OV.  Past Medical History:  Past Medical History:  Diagnosis Date   Anxiety    Arthritis    Blind left eye    [place]    Colitis    Coronary artery disease    a. 10/2016 Staged PCI: RCA 50p, 880m2.25x12 Resolute Onyx DES), LCX 50p, 8064m.75x23 Xience Alpine DES). Residual LAD 50p/m, 73m35m  Cystocele 10/11/2013   Depression    Diastolic dysfunction    a. 09/2016 Echo: EF 50%,  diffuse hypokinesis, mild LVH, grade 1 diastolic dysfunction, mild mitral regurgitation, mildly dilated left atrium.   GERD (gastroesophageal reflux disease)    occasional Tums only   HOH (hard of hearing)    Pelvic relaxation 10/11/2013   Proctitis    colonsocopy 2007, Canasa suppositories   S/P endoscopy March 2009   mild erosive reflux esophagitis, Schatzki's ring, s/p dilation   Schatzki's ring    Shortness of breath    occ if anxiety   Wears dentures    upper   Wears glasses     Past Surgical History:  Past Surgical History:  Procedure  Laterality Date   ABDOMINAL HYSTERECTOMY     ANTERIOR AND POSTERIOR REPAIR N/A 05/17/2014   Procedure: ANTERIOR (CYSTOCELE) AND POSTERIOR REPAIR (RECTOCELE);  Surgeon: Reece Packer, MD;  Location: [facility];  Service: Urology;  Laterality: N/A;   APPENDECTOMY     BREAST LUMPECTOMY WITH NEEDLE LOCALIZATION AND AXILLARY SENTINEL LYMPH NODE BX Left 05/03/2013   Procedure: BREAST LUMPECTOMY WITH NEEDLE LOCALIZATION AND AXILLARY SENTINEL LYMPH NODE BX;  Surgeon: Edward Jolly, MD;  Location: [facility];  Service: General;  Laterality: Left;   BREAST SURGERY Left 05/19/13   CARDIAC CATHETERIZATION  1998   CARDIAC CATHETERIZATION N/A 11/04/2016   Procedure: Left Heart Cath and Coronary Angiography;  Surgeon: Troy Sine, MD;  Location: [facility];  Service: Cardiovascular;  Laterality: N/A;   CARDIAC CATHETERIZATION N/A 11/05/2016   Procedure: Coronary Stent Intervention;  Surgeon: Troy Sine, MD;  Location: [facility];  Service: Cardiovascular;  Laterality: N/A;   CHOLECYSTECTOMY     COLONOSCOPY  05/06/2006   Diffuse inflammatory changes of the rectal mucosa, consistent  with proctitis.  Otherwise, normal colon to terminal ileum   CORONARY ANGIOPLASTY  11/04/2016   CORONARY STENT PLACEMENT     Drug-eluting coronary artery stent, non-bioabsorbable-polymer-coated   CYSTOSCOPY N/A 05/17/2014   Procedure: CYSTOSCOPY;  Surgeon: Reece Packer, MD;  Location: [facility];  Service: Urology;  Laterality: N/A;   ESOPHAGOGASTRODUODENOSCOPY  02/09/2008   Schatzki ring status post dilation/Distal esophageal erosion consistent with mild erosive reflux  esophagitis, otherwise unremarkable esophagus, normal stomach, D1, D2.   EYE SURGERY     lt cataract-implant   EYE SURGERY     lt-lens repaced,l   LUMBAR DISC SURGERY  1993   RE-EXCISION OF BREAST LUMPECTOMY Left 05/19/2013   Procedure: RE-EXCISION OF BREAST LUMPECTOMY;  Surgeon: Edward Jolly, MD;  Location:  [facility];  Service: General;  Laterality: Left;   RETINAL DETACHMENT SURGERY  1978   Left   VAGINAL HYSTERECTOMY Bilateral 05/17/2014   Procedure: HYSTERECTOMY VAGINAL with Bilateral Salpingo-Oophorectomy; Bladder cystotomy repair;  Surgeon: Marvene Staff, MD;  Location: [facility];  Service: Gynecology;  Laterality: Bilateral;   VAGINAL PROLAPSE REPAIR N/A 05/17/2014   Procedure: VAGINAL VAULT PROLAPSE AND GRAFT;  Surgeon: Reece Packer, MD;  Location: [facility];  Service: Urology;  Laterality: N/A;    Allergies:  Allergies  Allergen Reactions   Contrast Media [Iodinated Contrast Media] Anaphylaxis    Adverse reaction; pt hyperventilating, c/o CP and SOB    Lipitor [Atorvastatin] Other (See Comments)    Muscle aches, cramps, fatigue, weight loss/poor appetite   Sulfamethoxazole Other (See Comments)    Reaction was years ago unknown   Sulfonamide Derivatives Other (See Comments)    Dizziness     Family History:  Family History  Problem Relation Age of Onset  Cancer Brother        spinal   Heart attack Mother    Heart attack Father    Heart attack Son    Heart attack Son    Depression Son    Multiple myeloma Daughter    Anemia Daughter    Colon cancer Neg Hx     Social History:  Social History   Tobacco Use   Smoking status: Former    Packs/day: 1.00    Types: Cigarettes    Quit date: 04/28/1990    Years since quitting: 32.4   Smokeless tobacco: Never   Tobacco comments:    quit 28 yrs ago  Substance Use Topics   Alcohol use: Yes    Alcohol/week: 1.0 standard drink of alcohol    Types: 1 Glasses of wine per week    Comment: occ   Drug use: No    Review of symptoms:  Constitutional:  Negative for unexplained weight loss, night sweats, fever, chills ENT:  Negative for nose bleeds, sinus pain, painful swallowing CV:  Negative for chest pain, shortness of breath, exercise intolerance, palpitations, loss of consciousness Resp:  Negative for  cough, wheezing, shortness of breath GI:  Negative for nausea, vomiting, diarrhea, bloody stools GU:  Positives noted in HPI;  Neuro:  Negative for seizures Psych:  Negative for lack of energy, depression, anxiety Endocrine:  Negative for polydipsia, polyuria, symptoms of hypoglycemia (dizziness, hunger, sweating) Hematologic:  Negative for anemia, purpura, petechia, prolonged or excessive bleeding, use of anticoagulants   Physical Exam: BP 123/75   Pulse 90   Ht 5' 4"  (1.626 m)   Wt 150 lb (68 kg)   BMI 25.75 kg/m   Constitutional:  Alert and oriented, No acute distress. HEENT: NCAT, moist mucus membranes.  Trachea midline, no masses. Cardiovascular: Regular rate and rhythm without murmur, rub, or gallops. No clubbing, cyanosis, or edema. Respiratory: Normal respiratory effort, clear to auscultation bilaterally GI: Abdomen is soft, nontender, nondistended, no abdominal masses BACK:  Non-tender to palpation.  No CVAT Skin: No obvious rashes, warm, dry, intact Neurologic: Alert and oriented, Cranial nerves grossly intact, no focal deficits, moving all 4 extremities. Psychiatric: Appropriate. Normal mood and affect.  Laboratory Data: No results found for this or any previous visit (from the past 24 hour(s)).  Lab Results  Component Value Date   WBC 3.6 (L) 07/19/2022   HGB 11.7 (L) 07/19/2022   HCT 35.8 (L) 07/19/2022   MCV 107.8 (H) 07/19/2022   PLT 148 (L) 07/19/2022    Lab Results  Component Value Date   CREATININE 0.79 07/19/2022     Lab Results  Component Value Date   HGBA1C 5.4 10/20/2020    Urinalysis    Component Value Date/Time   COLORURINE STRAW (A) 10/19/2020 0653   APPEARANCEUR CLEAR 10/19/2020 0653   LABSPEC 1.004 (L) 10/19/2020 0653   PHURINE 8.0 10/19/2020 0653   GLUCOSEU NEGATIVE 10/19/2020 0653   HGBUR SMALL (A) 10/19/2020 0653   BILIRUBINUR large (A) 08/19/2022 1717   KETONESUR small (15) (A) 08/19/2022 1717   KETONESUR NEGATIVE 10/19/2020  0653   PROTEINUR =100 (A) 08/19/2022 1717   PROTEINUR NEGATIVE 10/19/2020 0653   UROBILINOGEN 0.2 08/19/2022 1717   UROBILINOGEN 0.2 05/28/2014 1147   NITRITE Negative 08/19/2022 1717   NITRITE NEGATIVE 10/19/2020 0653   LEUKOCYTESUR Trace (A) 08/19/2022 1717   LEUKOCYTESUR NEGATIVE 10/19/2020 0653    Lab Results  Component Value Date   BACTERIA NONE SEEN 10/19/2020    Pertinent  Imaging: No results found for this or any previous visit.  No results found for this or any previous visit.     Summerlin, Berneice Heinrich, PA-C [facility]

## 2022-09-16 NOTE — Patient Instructions (Signed)
Myrbetriq-One tablet in the evening

## 2022-09-16 NOTE — Progress Notes (Signed)
post void residual =0mL 

## 2022-09-17 ENCOUNTER — Telehealth: Payer: Self-pay

## 2022-09-17 NOTE — Telephone Encounter (Signed)
Returned call to daughter concerning question regarding Myrbetriq. Reviewed question with J.Summerlin PA. Daughter made aware that myrbetriq does not cause burning and patients ua showed no infection.  Daughter states that she will give the medicine more time to work.

## 2022-09-20 ENCOUNTER — Other Ambulatory Visit (HOSPITAL_COMMUNITY): Payer: Self-pay

## 2022-09-20 ENCOUNTER — Encounter: Payer: Self-pay | Admitting: Physician Assistant

## 2022-09-20 ENCOUNTER — Telehealth: Payer: Self-pay

## 2022-09-20 NOTE — Telephone Encounter (Signed)
Oral Oncology Patient Advocate Encounter   Received notification that patient is due for re-enrollment for assistance for Ibrance through [employer].   Re-enrollment process has been initiated and will be submitted upon completion of necessary documents.  [employer]'s phone number [phone].   I will continue to follow until final determination.  [name], [facility] Patient [place]  [phone] (phone) [phone] (fax) 09/20/2022 9:34 AM

## 2022-09-20 NOTE — Telephone Encounter (Signed)
Oral Oncology Patient Advocate Encounter  Reached out and spoke with patient regarding PAP paperwork, explained that I would send it to their preferred email via [employer].   Confirmed email address: [email or web address].    Patient expressed understanding and consent.  Will follow up once paperwork has been signed and returned.   [name], [facility] Patient [place]  [phone] (phone) [phone] (fax) 09/20/2022 12:14 PM

## 2022-09-24 DIAGNOSIS — H401131 Primary open-angle glaucoma, bilateral, mild stage: Secondary | ICD-10-CM | POA: Diagnosis not present

## 2022-09-24 DIAGNOSIS — H35363 Drusen (degenerative) of macula, bilateral: Secondary | ICD-10-CM | POA: Diagnosis not present

## 2022-09-24 DIAGNOSIS — H1812 Bullous keratopathy, left eye: Secondary | ICD-10-CM | POA: Diagnosis not present

## 2022-09-24 DIAGNOSIS — H18513 Endothelial corneal dystrophy, bilateral: Secondary | ICD-10-CM | POA: Diagnosis not present

## 2022-09-24 NOTE — Telephone Encounter (Signed)
Oral Oncology Patient Advocate Encounter   Submitted application for assistance for Ibrance to [employer].   Application submitted via e-fax to [phone]   [employer]'s phone number [phone].   I will continue to check the status until final determination.   [name], [facility] Patient [place]  [phone] (phone) [phone] (fax) 09/24/2022 9:59 AM

## 2022-09-26 ENCOUNTER — Other Ambulatory Visit: Payer: Self-pay | Admitting: Physician Assistant

## 2022-09-26 MED ORDER — DOXYCYCLINE HYCLATE 100 MG PO CAPS: 100.0000 mg | ORAL_CAPSULE | Freq: Two times a day (BID) | ORAL | 0 refills | Status: DC

## 2022-09-26 NOTE — Telephone Encounter (Signed)
-----   Message from Reynaldo Minium, [place] sent at 09/26/2022  3:55 PM EDT ----- Please let pt or her daughter know her MDX cx (the special cx I told them about) just resulted and she does indeed have a UTI. I have sent a Rx to [facility] for her. ----- Message ----- From: Jennette Bill Sent: 09/26/2022   2:42 PM EDT To: Reynaldo Minium, PA-C

## 2022-09-26 NOTE — Telephone Encounter (Signed)
Made patient's daughter Kara Woods aware that mom's culture indicated a UTI and Sharee Pimple sent in a Rx to [facility]. Patient's daughter Kara Woods voiced understanding.

## 2022-09-26 NOTE — Progress Notes (Signed)
MDX culture indicates E. coli, pansensitive.  Allergies reviewed.  Will avoid Macrobid due to patient's history of altered mental status.  Doxycycline sent to the patient's pharmacy.

## 2022-09-27 ENCOUNTER — Telehealth: Payer: Self-pay | Admitting: Physician Assistant

## 2022-09-27 ENCOUNTER — Other Ambulatory Visit (HOSPITAL_COMMUNITY): Payer: Self-pay | Admitting: Hematology

## 2022-09-27 DIAGNOSIS — C50919 Malignant neoplasm of unspecified site of unspecified female breast: Secondary | ICD-10-CM

## 2022-09-27 DIAGNOSIS — Z170 Estrogen receptor positive status [ER+]: Secondary | ICD-10-CM

## 2022-09-27 NOTE — Telephone Encounter (Signed)
Pt's daughter, Hoyle Sauer, who was driving by, came to office to discuss concerns that pt has waited so long since OV for antibx tx. I explained to her and and sisters via conference call that the MDX cx only resulted yesterday showing E. Coli-pansensative. I discussed that pt UA in office did not indicate UTI and Rxing antibx without sxs or microscopic/culture confirmation can create antibx resistance and other concerning side effects including C. Diff, which can be life threatening. We moved pt's FU appt to 2 weeks for UA to ensure UTI has cleared and will order FU MDX cx to confirm no pathogen growth. If pt develops vaginal yeast sxs, recommended 1 dose Monistat OTC or will call in dose of Diflucan. Family agrees with the plan and will bring pt in 2 weeks for FU as discussed. Advised to call or return for OV sooner if pt develops sxs concerning for urinary issues. Pt will begin Doxycycline today x 7 days. She will also keep neuro eval as scheduled. Upon return, pt will need UA with microscopic exam, PVR, and MDX culture ordered.

## 2022-10-01 ENCOUNTER — Ambulatory Visit (INDEPENDENT_AMBULATORY_CARE_PROVIDER_SITE_OTHER): Payer: Medicare Other | Admitting: Neurology

## 2022-10-01 ENCOUNTER — Encounter: Payer: Self-pay | Admitting: Neurology

## 2022-10-01 VITALS — BP 144/76 | HR 77 | Ht 64.0 in | Wt 154.0 lb

## 2022-10-01 DIAGNOSIS — N390 Urinary tract infection, site not specified: Secondary | ICD-10-CM

## 2022-10-01 DIAGNOSIS — R441 Visual hallucinations: Secondary | ICD-10-CM | POA: Diagnosis not present

## 2022-10-01 DIAGNOSIS — G3184 Mild cognitive impairment, so stated: Secondary | ICD-10-CM

## 2022-10-01 MED ORDER — CLORAZEPATE DIPOTASSIUM 3.75 MG PO TABS: 3.7500 mg | ORAL_TABLET | Freq: Two times a day (BID) | ORAL | 1 refills | Status: DC | PRN

## 2022-10-01 MED ORDER — OLANZAPINE 2.5 MG PO TABS: 2.5000 mg | ORAL_TABLET | Freq: Two times a day (BID) | ORAL | 0 refills | Status: DC

## 2022-10-01 NOTE — Progress Notes (Signed)
[facility]  PATIENT: Kara Woods DOB: [birth year]  REQUESTING CLINICIAN: Valentino Nose, FNP HISTORY FROM: Patient and daughter  REASON FOR VISIT: New onset Hallucinations    HISTORICAL  CHIEF COMPLAINT:  Chief Complaint  Patient presents with   New Patient (Initial Visit)    Rm 12, alone NP/Paper Proficient/Z Hall MD office/Eboni Farmer NP [phone]/hallucinations/ States it started 8 months off and on , recently increased     HISTORY OF PRESENT ILLNESS:  This is a 88 year old woman past medical history of hypertension, anxiety/depression, insomnia, recurrent UTI and left eye blindness who is presenting with daughter with complaint of recurrent hallucinations.  Daughter has reported that the hallucinations have been going on for the past 8 months.  Initially it was attributed to UTI as patient is having recurrent UTIs but the frequency of the hallucinations have increased.  Patient reports seeing people in her house, 4 weeks ago, she had an episode when she was seen water coming out of every wall of her house, and underneath her there was a black sand.   She also reports seeing people in the house. Patient tells me a story about couple weeks ago her phone rang someone call her and told her that "I do know you have cancer and we are here to help you" then they hang then few minutes later 5 people came to the house, they did not say a word but there was just looking around she asked questions but they were not responding. Since then she has been seeing mainly at night and describes tham as a lady with 2 adult children and 2 small children (girls) coming in the house almost every night.  Again she is reporting these people do not respond to her, they do not talk to her, unless on one occasion when she heard the mother talking to one of the sons.  She reports that these events are upsetting to her, she does not want to see them or hear them because they are  bothersome to her. They come to her house, come to her bedroom, come to the bathroom, and since then she has difficulty falling asleep.  She is taking clorazepate to help with her sleep.  She has a history of brain cancer with bone mets, her most recent MRI brain did not show any Brain mets. Daughter reports that patient is very independent, lives alone but all her children live about a mile away. Daughter are handling her finances and also helping with her bills.    OTHER MEDICAL CONDITIONS: Hypertension, Anxiety/Depression, Insomnia, recurrent UTI, L eye blindness, Breast cancer with bone mets   REVIEW OF SYSTEMS: Full 14 system review of systems performed and negative with exception of: As noted in the  HPI  ALLERGIES: Allergies  Allergen Reactions   Contrast Media [Iodinated Contrast Media] Anaphylaxis    Adverse reaction; pt hyperventilating, c/o CP and SOB    Lipitor [Atorvastatin] Other (See Comments)    Muscle aches, cramps, fatigue, weight loss/poor appetite   Sulfamethoxazole Other (See Comments)    Reaction was years ago unknown   Sulfonamide Derivatives Other (See Comments)    Dizziness     HOME MEDICATIONS: Outpatient Medications Prior to Visit  Medication Sig Dispense Refill   acetaminophen (TYLENOL) 325 MG tablet Take 2 tablets (650 mg total) by mouth every 6 (six) hours as needed for mild pain or headache (or temp > 37.5 C (99.5 F)). 40 tablet 0   anastrozole (ARIMIDEX) 1 MG  tablet TAKE 1 TABLET BY MOUTH EVERY DAY 90 tablet 2   aspirin 81 MG EC tablet Take 1 tablet (81 mg total) by mouth daily. Swallow whole. 60 tablet 2   azithromycin (ZITHROMAX) 250 MG tablet      carboxymethylcellulose (REFRESH PLUS) 0.5 % SOLN Place 3-4 drops into both eyes 3 (three) times daily as needed (dry eyes). Preservative free     doxycycline (VIBRAMYCIN) 100 MG capsule Take 1 capsule (100 mg total) by mouth every 12 (twelve) hours. 14 capsule 0   escitalopram (LEXAPRO) 10 MG tablet TAKE  1 TABLET (10 MG TOTAL) BY MOUTH DAILY. TAKE ALONG WITH 20MG TABLET FOR TOTAL DOSE OF 30MG DAILY 90 tablet 2   escitalopram (LEXAPRO) 20 MG tablet Take 1 tablet (20 mg total) by mouth daily. 90 tablet 2   HYDROcodone-acetaminophen (NORCO) 5-325 MG tablet Take 1 tablet by mouth every 4 (four) hours as needed for moderate pain. 180 tablet 0   latanoprost (XALATAN) 0.005 % ophthalmic solution Place 1 drop into both eyes at bedtime.     metoprolol tartrate (LOPRESSOR) 25 MG tablet TAKE HALF A TABLET BY MOUTH 2 TIMES DAILY. 90 tablet 3   mirabegron ER (MYRBETRIQ) 25 MG TB24 tablet Take 1 tablet (25 mg total) by mouth daily. 28 tablet 0   nitroGLYCERIN (NITROSTAT) 0.4 MG SL tablet Place 1 tablet (0.4 mg total) under the tongue every 5 (five) minutes as needed. 25 tablet 3   palbociclib (IBRANCE) 75 MG tablet Take 1 tablet (75 mg total) by mouth daily. Take for 21 days on, 7 days off, repeat every 28 days. 21 tablet 6   clorazepate (TRANXENE) 15 MG tablet Take 1 tablet (15 mg total) by mouth daily. 90 tablet 0   No facility-administered medications prior to visit.    PAST MEDICAL HISTORY: Past Medical History:  Diagnosis Date   Anxiety    Arthritis    Blind left eye    Breast cancer ([place])    left    Colitis    Coronary artery disease    a. 10/2016 Staged PCI: RCA 50p, 50m(2.25x12 Resolute Onyx DES), LCX 50p, 840m2.75x23 Xience Alpine DES). Residual LAD 50p/m, 4519m   Cystocele 10/11/2013   Depression    Diastolic dysfunction    a. 09/2016 Echo: EF 50%, diffuse hypokinesis, mild LVH, grade 1 diastolic dysfunction, mild mitral regurgitation, mildly dilated left atrium.   GERD (gastroesophageal reflux disease)    occasional Tums only   HOH (hard of hearing)    Pelvic relaxation 10/11/2013   Proctitis    colonsocopy 2007, Canasa suppositories   S/P endoscopy March 2009   mild erosive reflux esophagitis, Schatzki's ring, s/p dilation   Schatzki's ring    Shortness of breath    occ if  anxiety   Wears dentures    upper   Wears glasses     PAST SURGICAL HISTORY: Past Surgical History:  Procedure Laterality Date   ABDOMINAL HYSTERECTOMY     ANTERIOR AND POSTERIOR REPAIR N/A 05/17/2014   Procedure: ANTERIOR (CYSTOCELE) AND POSTERIOR REPAIR (RECTOCELE);  Surgeon: ScoReece PackerD;  [facility];  Service: Urology;  Laterality: N/A;   APPENDECTOMY     BREAST LUMPECTOMY WITH NEEDLE LOCALIZATION AND AXILLARY SENTINEL LYMPH NODE BX Left 05/03/2013   Procedure: BREAST LUMPECTOMY WITH NEEDLE LOCALIZATION AND AXILLARY SENTINEL LYMPH NODE BX;  Surgeon: BenEdward JollyD;  [facility]  Laterality: Left;   BREAST SURGERY  Left 05/19/13   CARDIAC CATHETERIZATION  1998   CARDIAC CATHETERIZATION N/A 11/04/2016   Procedure: Left Heart Cath and Coronary Angiography;  Surgeon: Troy Sine, MD;  Location: [facility];  Service: Cardiovascular;  Laterality: N/A;   CARDIAC CATHETERIZATION N/A 11/05/2016   Procedure: Coronary Stent Intervention;  Surgeon: Troy Sine, MD;  Location: [facility];  Service: Cardiovascular;  Laterality: N/A;   CHOLECYSTECTOMY     COLONOSCOPY  05/06/2006   Diffuse inflammatory changes of the rectal mucosa, consistent  with proctitis.  Otherwise, normal colon to terminal ileum   CORONARY ANGIOPLASTY  11/04/2016   CORONARY STENT PLACEMENT     Drug-eluting coronary artery stent, non-bioabsorbable-polymer-coated   CYSTOSCOPY N/A 05/17/2014   Procedure: CYSTOSCOPY;  Surgeon: Reece Packer, MD;  Location: [facility];  Service: Urology;  Laterality: N/A;   ESOPHAGOGASTRODUODENOSCOPY  02/09/2008   Schatzki ring status post dilation/Distal esophageal erosion consistent with mild erosive reflux  esophagitis, otherwise unremarkable esophagus, normal stomach, D1, D2.   EYE SURGERY     lt cataract-implant   EYE SURGERY     lt-lens repaced,l   LUMBAR DISC SURGERY  1993   RE-EXCISION OF BREAST  LUMPECTOMY Left 05/19/2013   Procedure: RE-EXCISION OF BREAST LUMPECTOMY;  Surgeon: Edward Jolly, MD;  Location: [facility];  Service: General;  Laterality: Left;   RETINAL DETACHMENT SURGERY  1978   Left   VAGINAL HYSTERECTOMY Bilateral 05/17/2014   Procedure: HYSTERECTOMY VAGINAL with Bilateral Salpingo-Oophorectomy; Bladder cystotomy repair;  Surgeon: Marvene Staff, MD;  Location: [facility];  Service: Gynecology;  Laterality: Bilateral;   VAGINAL PROLAPSE REPAIR N/A 05/17/2014   Procedure: VAGINAL VAULT PROLAPSE AND GRAFT;  Surgeon: Reece Packer, MD;  Location: [facility];  Service: Urology;  Laterality: N/A;    FAMILY HISTORY: Family History  Problem Relation Age of Onset   Cancer Brother        spinal   Heart attack Mother    Heart attack Father    Heart attack Son    Heart attack Son    Depression Son    Multiple myeloma Daughter    Anemia Daughter    Colon cancer Neg Hx     SOCIAL HISTORY: Social History   Socioeconomic History   Marital status: Widowed    Spouse name: Not on file   Number of children: 6   Years of education: Not on file   Highest education level: Not on file  Occupational History   Occupation: Retired  Tobacco Use   Smoking status: Former    Packs/day: 1.00    Types: Cigarettes    Quit date: 04/28/1990    Years since quitting: 32.4   Smokeless tobacco: Never   Tobacco comments:    quit 28 yrs ago  Substance and Sexual Activity   Alcohol use: Yes    Alcohol/week: 1.0 standard drink of alcohol    Types: 1 Glasses of wine per week    Comment: occ   Drug use: No   Sexual activity: Not Currently    Birth control/protection: Post-menopausal  Other Topics Concern   Not on file  Social History Narrative   Not on file   Social Determinants of Health   Financial Resource Strain: Low Risk  (09/27/2020)   Overall Financial Resource Strain (CARDIA)    Difficulty of Paying Living Expenses: Not hard at all  Food  Insecurity: No Food Insecurity (09/27/2020)   Hunger Vital Sign  Worried About Charity fundraiser in the Last Year: Never true    [place] in the Last Year: Never true  Transportation Needs: No Transportation Needs (09/27/2020)   PRAPARE - Hydrologist (Medical): No    Lack of Transportation (Non-Medical): No  Physical Activity: Insufficiently Active (09/27/2020)   Exercise Vital Sign    Days of Exercise per Week: 2 days    Minutes of Exercise per Session: 10 min  Stress: No Stress Concern Present (09/27/2020)   [place]    Feeling of Stress : Only a little  Social Connections: Socially Isolated (09/27/2020)   Social Connection and Isolation Panel [NHANES]    Frequency of Communication with Friends and Family: More than three times a week    Frequency of Social Gatherings with Friends and Family: More than three times a week    Attends Religious Services: Never    Marine scientist or Organizations: No    Attends Archivist Meetings: Never    Marital Status: Widowed  Intimate Partner Violence: Not At Risk (09/27/2020)   Humiliation, Afraid, Rape, and Kick questionnaire    Fear of Current or Ex-Partner: No    Emotionally Abused: No    Physically Abused: No    Sexually Abused: No    PHYSICAL EXAM  GENERAL EXAM/CONSTITUTIONAL: Vitals:  Vitals:   10/01/22 1459  BP: (!) 144/76  Pulse: 77  Weight: 154 lb (69.9 kg)  Height: _0  (1.626 m)   Body mass index is 26.43 kg/m. Wt Readings from Last 3 Encounters:  10/01/22 154 lb (69.9 kg)  09/16/22 150 lb (68 kg)  07/25/22 150 lb 11.2 oz (68.4 kg)   Patient is in no distress; well developed, nourished and groomed; neck is supple  EYES: Pupils round and reactive to light, Visual fields full to confrontation, Extraocular movements intacts,   MUSCULOSKELETAL: Gait, strength, tone, movements noted in Neurologic  exam below  NEUROLOGIC: MENTAL STATUS:      No data to display         awake, alert, oriented to person, place and time Registration intact but unable to recall 3 items after 5 minutes.  Reports 6 quarters in $1.75, unable to spell EMPTY backward language fluent, comprehension intact, naming intact fund of knowledge appropriate  CRANIAL NERVE:  2nd, 3rd, 4th, 6th - pupils equal and reactive to light, visual fields full to confrontation, extraocular muscles intact, no nystagmus. Blind in the left eye  5th - facial sensation symmetric 7th - facial strength symmetric 8th - hearing intact 9th - palate elevates symmetrically, uvula midline 11th - shoulder shrug symmetric 12th - tongue protrusion midline  MOTOR:  normal bulk and tone, full strength in the BUE, BLE  SENSORY:  normal and symmetric to light touch  COORDINATION:  finger-nose-finger, fine finger movements normal  REFLEXES:  deep tendon reflexes present and symmetric  GAIT/STATION:  normal   DIAGNOSTIC DATA (LABS, IMAGING, TESTING) - I reviewed patient records, labs, notes, testing and imaging myself where available.  Lab Results  Component Value Date   WBC 3.6 (L) 07/19/2022   HGB 11.7 (L) 07/19/2022   HCT 35.8 (L) 07/19/2022   MCV 107.8 (H) 07/19/2022   PLT 148 (L) 07/19/2022      Component Value Date/Time   NA 141 07/19/2022 0945   K 4.1 07/19/2022 0945   CL 108 07/19/2022 0945   CO2 27 07/19/2022  0945   GLUCOSE 100 (H) 07/19/2022 0945   BUN 12 07/19/2022 0945   CREATININE 0.79 07/19/2022 0945   CREATININE 0.72 11/11/2016 1208   CALCIUM 8.6 (L) 07/19/2022 0945   PROT 7.1 07/19/2022 0945   ALBUMIN 3.7 07/19/2022 0945   AST 21 07/19/2022 0945   ALT 13 07/19/2022 0945   ALKPHOS 37 (L) 07/19/2022 0945   BILITOT 0.8 07/19/2022 0945   GFRNONAA >60 07/19/2022 0945   GFRAA >60 08/28/2020 1449   Lab Results  Component Value Date   CHOL 162 10/20/2020   HDL 46 10/20/2020   LDLCALC 97  10/20/2020   TRIG 97 10/20/2020   CHOLHDL 3.5 10/20/2020   Lab Results  Component Value Date   HGBA1C 5.4 10/20/2020   Lab Results  Component Value Date   WNUUVOZD66 440 10/19/2020   Lab Results  Component Value Date   TSH 1.339 10/19/2020    MRI Brain 08/20/22 No evidence of acute intracranial abnormality or metastatic disease     ASSESSMENT AND PLAN  88 y.o. year old female with history of breast cancer with mets to bone, hypertension, anxiety/depression, insomnia, recurrent UTI and left eye blindness who is presenting with daughter with complaint of recurrent hallucinations.  I have explained to patient and daughter that sometimes patient with UTI can have encephalopathy and hallucinations but these hallucinations are not likely related to UTI since her urine has been clear.  Sometimes also medication such as antibiotics can cause side effect but she is not taking currently antibiotics for UTI.  She is on a long standing of clorazepate.  I will go ahead and decrease it from 7.5 mg to 3.75 mg.  I will add Zyprexa low-dose 2.5 mg nightly and will increase as tolerated.  She does have cognitive impairment as noted in the exam, she does have difficulty with attention and concentration and also difficulty with recall.  She does not have a clear diagnosis of moderate to severe dementia to explain the hallucination.  Her MRI brain did not show any evidence of metastatic disease.  At the moment it is unclear for the causes of the hallucination unless it is a new primary psychiatric disorder.  We will continue to monitor and evaluate her.  If needed, we will repeat her MRI brain.  This was explained to the daughter and patient and they are both comfortable with plan.  They will contact me to update me on her status.  I will also obtain a routine EEG to rule out seizures. Follow up in 3 months    1. Hallucination, visual   2. Mild cognitive impairment   3. Recurrent UTI      Patient  Instructions  Decrease Clorazepate to 3.75 mg from 7.84m  Start Zyprexa 2.5 mg nightly, can increase to BID as tolerated. Daughter will contact me and update me  Continue your other medications  Routine EEG  Follow up in 3 months or sooner if worse    Orders Placed This Encounter  Procedures   EEG adult    Meds ordered this encounter  Medications   clorazepate (TRANXENE) 3.75 MG tablet    Sig: Take 1 tablet (3.75 mg total) by mouth 2 (two) times daily as needed for anxiety.    Dispense:  90 tablet    Refill:  1   OLANZapine (ZYPREXA) 2.5 MG tablet    Sig: Take 1 tablet (2.5 mg total) by mouth in the morning and at bedtime.    Dispense:  60  tablet    Refill:  0    Return in about 3 months (around 01/01/2023).  I have spent a total of 86 minutes dedicated to this patient today, preparing to see patient, performing a medically appropriate examination and evaluation, ordering tests and/or medications and procedures, and counseling and educating the patient/family/caregiver; independently interpreting result and communicating results to the family/patient/caregiver; and documenting clinical information in the electronic medical record.   Alric Ran, MD 10/01/2022, 5:40 PM  [facility] [address] [place], [place] [address] [phone]

## 2022-10-01 NOTE — Patient Instructions (Addendum)
Decrease Clorazepate to 3.75 mg from 7.'5mg'$   Start Zyprexa 2.5 mg nightly, can increase to BID as tolerated. Daughter will contact me and update me  Continue your other medications  Routine EEG  Follow up in 3 months or sooner if worse

## 2022-10-02 ENCOUNTER — Telehealth: Payer: Self-pay | Admitting: *Deleted

## 2022-10-02 NOTE — Telephone Encounter (Signed)
Daughter called to advise that patient is having 2 teeth extracted.  Current therapy is xgeva.  Made her aware that it is ok to proceed with extractions and Dr. Delton Coombes will discuss therapy, going further at December appointment.  Verbalized understanding.

## 2022-10-03 ENCOUNTER — Ambulatory Visit (INDEPENDENT_AMBULATORY_CARE_PROVIDER_SITE_OTHER): Payer: Medicare Other | Admitting: Neurology

## 2022-10-03 DIAGNOSIS — R441 Visual hallucinations: Secondary | ICD-10-CM

## 2022-10-03 DIAGNOSIS — R410 Disorientation, unspecified: Secondary | ICD-10-CM

## 2022-10-04 NOTE — Procedures (Signed)
    History:  88 year old man with new hallucinations   EEG classification: Awake and drowsy  Description of the recording: The background rhythms of this recording consists of a fairly well modulated medium amplitude alpha rhythm of 9 Hz that is reactive to eye opening and closure. Present in the anterior head region is a 15-20 Hz beta activity. Photic stimulation was performed, did not show any abnormalities. Hyperventilation was also performed, did not show any abnormalities. Drowsiness was manifested by background fragmentation. No abnormal epileptiform discharges seen during this recording. There was no focal slowing. There were no electrographic seizure identified.   Abnormality: None   Impression: This is a normal EEG recorded while drowsy and awake. No evidence of interictal epileptiform discharges. Normal EEGs, however, do not rule out epilepsy.    Alric Ran, MD [facility]

## 2022-10-07 ENCOUNTER — Ambulatory Visit: Payer: Self-pay | Admitting: *Deleted

## 2022-10-07 NOTE — Patient Outreach (Signed)
  Care Coordination   Initial Visit Note   01/07/2023 late entry for 10/08/23 Name: Kara Woods MRN: [id number] DOB: 1933-03-13  Kara Woods is a 88 y.o. year old female who sees Kara Woods, Kara Areola, MD for primary care. I spoke with Kara Woods, the daughter of Kara Woods by phone today. Kara Woods has cognitive concerns  What matters to the patients health and wellness today?  Kara Woods denies any concerns at this time and agrees to follow up quarterly. She reports Kara Woods has been managing for the last 2 years Seen urologist  & neurologist recent. UTI is resolved (hx of a year of ongoing UTIs, wears pads for leakage) and continues to have diarrhea that varies related to her medication. Kara Woods denies possible correlation of the stools and UTIs. Urologist started patient on Myrbetriq 25 mg daily Samples were given    Goals Addressed               This Visit's Progress     Patient Stated     manage UTIs ([facility]) (pt-stated)   On track     Care Coordination Interventions: Evaluation of current treatment plan related to UTI  and patient's adherence to plan as established by provider Provided education to patient re: UTI Discussed plans with patient for ongoing care management follow up and provided patient with direct contact information for care management team Screening for signs and symptoms of depression related to chronic disease state  Assessed social determinant of health barriers        SDOH assessments and interventions completed:  Yes  SDOH Interventions Today    Flowsheet Row Most Recent Value  SDOH Interventions   Food Insecurity Interventions Intervention Not Indicated  Transportation Interventions Intervention Not Indicated  Utilities Interventions Intervention Not Indicated  Financial Strain Interventions Intervention Not Indicated  Stress Interventions Intervention Not Indicated        Care Coordination Interventions Activated:  Yes  Care Coordination  Interventions:  Yes, provided   Follow up plan: Follow up call scheduled for 01/07/23    Encounter Outcome:  Pt. Visit Completed   Maddalyn Lutze L. Lavina Hamman, RN, BSN, [place] Coordinator Office number [phone]

## 2022-10-07 NOTE — Patient Instructions (Addendum)
Visit Information  Thank you for taking time to visit with me today. Please don't hesitate to contact me if I can be of assistance to you.   Following are the goals we discussed today:   Goals Addressed               This Visit's Progress     Patient Stated     manage UTIs [facility]) (pt-stated)   On track     Care Coordination Interventions: Evaluation of current treatment plan related to UTI  and patient's adherence to plan as established by provider Provided education to patient re: UTI Discussed plans with patient for ongoing care management follow up and provided patient with direct contact information for care management team Screening for signs and symptoms of depression related to chronic disease state  Assessed social determinant of health barriers        Our next appointment is by telephone on 01/07/22 at 0930  Please call the care guide team at [phone] if you need to cancel or reschedule your appointment.   If you are experiencing a Mental Health or [place] or need someone to talk to, please call the Suicide and Crisis Lifeline: 988 call the [place] National Suicide Prevention Lifeline: (332)821-0846 or TTY: 609-868-1953 TTY 279-851-2569) to talk to a trained counselor call 1-800-273-TALK (toll free, 24 hour hotline) call the [facility]: 4190746785 call 911   Patient verbalizes understanding of instructions and care plan provided today and agrees to view in [place]. Active MyChart status and patient understanding of how to access instructions and care plan via MyChart confirmed with patient.     The patient has been provided with contact information for the care management team and has been advised to call with any health related questions or concerns.    Fabian Coca L. Lavina Hamman, RN, BSN, [place] Coordinator Office number [phone]

## 2022-10-15 ENCOUNTER — Ambulatory Visit: Payer: 59 | Admitting: Physician Assistant

## 2022-10-21 ENCOUNTER — Encounter: Payer: Self-pay | Admitting: Neurology

## 2022-10-21 DIAGNOSIS — L84 Corns and callosities: Secondary | ICD-10-CM | POA: Insufficient documentation

## 2022-10-21 DIAGNOSIS — M7989 Other specified soft tissue disorders: Secondary | ICD-10-CM | POA: Diagnosis not present

## 2022-10-21 DIAGNOSIS — Z23 Encounter for immunization: Secondary | ICD-10-CM | POA: Diagnosis not present

## 2022-10-21 DIAGNOSIS — R350 Frequency of micturition: Secondary | ICD-10-CM | POA: Diagnosis not present

## 2022-10-21 DIAGNOSIS — M25471 Effusion, right ankle: Secondary | ICD-10-CM | POA: Insufficient documentation

## 2022-10-22 NOTE — Telephone Encounter (Signed)
We will continue once a day for now until family report worsening behavior

## 2022-10-23 ENCOUNTER — Encounter (HOSPITAL_COMMUNITY): Payer: Self-pay | Admitting: Hematology

## 2022-10-23 MED ORDER — OLANZAPINE 2.5 MG PO TABS: 2.5000 mg | ORAL_TABLET | Freq: Every day | ORAL | 3 refills | Status: DC

## 2022-10-24 ENCOUNTER — Inpatient Hospital Stay: Payer: Medicare Other

## 2022-10-24 ENCOUNTER — Encounter (HOSPITAL_COMMUNITY)
Admission: RE | Admit: 2022-10-24 | Discharge: 2022-10-24 | Disposition: A | Discharge status: Home / Self Care | Payer: Medicare Other | Source: Ambulatory Visit | Attending: Hematology | Admitting: Hematology

## 2022-10-24 DIAGNOSIS — Z79811 Long term (current) use of aromatase inhibitors: Secondary | ICD-10-CM | POA: Insufficient documentation

## 2022-10-24 DIAGNOSIS — Z7982 Long term (current) use of aspirin: Secondary | ICD-10-CM | POA: Insufficient documentation

## 2022-10-24 DIAGNOSIS — C50212 Malignant neoplasm of upper-inner quadrant of left female breast: Secondary | ICD-10-CM | POA: Insufficient documentation

## 2022-10-24 DIAGNOSIS — Z79899 Other long term (current) drug therapy: Secondary | ICD-10-CM | POA: Insufficient documentation

## 2022-10-24 DIAGNOSIS — C50912 Malignant neoplasm of unspecified site of left female breast: Secondary | ICD-10-CM | POA: Insufficient documentation

## 2022-10-24 DIAGNOSIS — C7951 Secondary malignant neoplasm of bone: Secondary | ICD-10-CM | POA: Insufficient documentation

## 2022-10-24 DIAGNOSIS — Z170 Estrogen receptor positive status [ER+]: Secondary | ICD-10-CM | POA: Insufficient documentation

## 2022-10-24 DIAGNOSIS — Z853 Personal history of malignant neoplasm of breast: Secondary | ICD-10-CM | POA: Diagnosis not present

## 2022-10-24 LAB — CBC WITH DIFFERENTIAL/PLATELET
Abs Immature Granulocytes: 0 10*3/uL (ref 0.00–0.07)
Basophils Absolute: 0.1 10*3/uL (ref 0.0–0.1)
Basophils Relative: 2 %
Eosinophils Absolute: 0 10*3/uL (ref 0.0–0.5)
Eosinophils Relative: 0 %
HCT: 35.6 % — ABNORMAL LOW (ref 36.0–46.0)
Hemoglobin: 11.7 g/dL — ABNORMAL LOW (ref 12.0–15.0)
Lymphocytes Relative: 47 %
Lymphs Abs: 1.5 10*3/uL (ref 0.7–4.0)
MCH: 35.6 pg — ABNORMAL HIGH (ref 26.0–34.0)
MCHC: 32.9 g/dL (ref 30.0–36.0)
MCV: 108.2 fL — ABNORMAL HIGH (ref 80.0–100.0)
Monocytes Absolute: 0.3 10*3/uL (ref 0.1–1.0)
Monocytes Relative: 9 %
Neutro Abs: 1.3 10*3/uL — ABNORMAL LOW (ref 1.7–7.7)
Neutrophils Relative %: 42 %
Platelets: 266 10*3/uL (ref 150–400)
RBC: 3.29 MIL/uL — ABNORMAL LOW (ref 3.87–5.11)
RDW: 14.8 % (ref 11.5–15.5)
WBC: 3.2 10*3/uL — ABNORMAL LOW (ref 4.0–10.5)
nRBC: 0 % (ref 0.0–0.2)

## 2022-10-24 LAB — COMPREHENSIVE METABOLIC PANEL
ALT: 12 U/L (ref 0–44)
AST: 21 U/L (ref 15–41)
Albumin: 3.6 g/dL (ref 3.5–5.0)
Alkaline Phosphatase: 39 U/L (ref 38–126)
Anion gap: 7 (ref 5–15)
BUN: 9 mg/dL (ref 8–23)
CO2: 28 mmol/L (ref 22–32)
Calcium: 8.5 mg/dL — ABNORMAL LOW (ref 8.9–10.3)
Chloride: 104 mmol/L (ref 98–111)
Creatinine, Ser: 0.84 mg/dL (ref 0.44–1.00)
GFR, Estimated: >60 mL/min (ref >60)
Glucose, Bld: 93 mg/dL (ref 70–99)
Potassium: 4.4 mmol/L (ref 3.5–5.1)
Sodium: 139 mmol/L (ref 135–145)
Total Bilirubin: 0.5 mg/dL (ref 0.3–1.2)
Total Protein: 7.2 g/dL (ref 6.5–8.1)

## 2022-10-24 MED ORDER — FLUDEOXYGLUCOSE F - 18 (FDG) INJECTION
8.3300 | Freq: Once | INTRAVENOUS | Status: AC | PRN
  Administered 2022-10-24: 8.33 via INTRAVENOUS

## 2022-10-25 LAB — CANCER ANTIGEN 27.29: CA 27.29: 37.4 U/mL (ref 0.0–38.6)

## 2022-10-26 LAB — CANCER ANTIGEN 15-3: CA 15-3: 20.7 U/mL (ref 0.0–25.0)

## 2022-10-28 ENCOUNTER — Ambulatory Visit: Payer: 59 | Admitting: Physician Assistant

## 2022-10-30 ENCOUNTER — Inpatient Hospital Stay: Payer: Medicare Other | Attending: Hematology | Admitting: Hematology

## 2022-10-30 VITALS — BP 105/67 | HR 74 | Temp 97.8°F | Resp 17 | Ht 64.0 in | Wt 150.4 lb

## 2022-10-30 DIAGNOSIS — M549 Dorsalgia, unspecified: Secondary | ICD-10-CM | POA: Diagnosis not present

## 2022-10-30 DIAGNOSIS — R197 Diarrhea, unspecified: Secondary | ICD-10-CM | POA: Diagnosis not present

## 2022-10-30 DIAGNOSIS — Z7982 Long term (current) use of aspirin: Secondary | ICD-10-CM | POA: Diagnosis not present

## 2022-10-30 DIAGNOSIS — Z87891 Personal history of nicotine dependence: Secondary | ICD-10-CM | POA: Insufficient documentation

## 2022-10-30 DIAGNOSIS — Z79811 Long term (current) use of aromatase inhibitors: Secondary | ICD-10-CM | POA: Insufficient documentation

## 2022-10-30 DIAGNOSIS — C50212 Malignant neoplasm of upper-inner quadrant of left female breast: Secondary | ICD-10-CM | POA: Diagnosis not present

## 2022-10-30 DIAGNOSIS — Z170 Estrogen receptor positive status [ER+]: Secondary | ICD-10-CM | POA: Diagnosis not present

## 2022-10-30 DIAGNOSIS — C7951 Secondary malignant neoplasm of bone: Secondary | ICD-10-CM | POA: Insufficient documentation

## 2022-10-30 DIAGNOSIS — Z79899 Other long term (current) drug therapy: Secondary | ICD-10-CM | POA: Insufficient documentation

## 2022-10-30 DIAGNOSIS — R0602 Shortness of breath: Secondary | ICD-10-CM | POA: Diagnosis not present

## 2022-10-30 DIAGNOSIS — G8929 Other chronic pain: Secondary | ICD-10-CM | POA: Insufficient documentation

## 2022-10-30 DIAGNOSIS — R42 Dizziness and giddiness: Secondary | ICD-10-CM | POA: Diagnosis not present

## 2022-10-30 DIAGNOSIS — C50912 Malignant neoplasm of unspecified site of left female breast: Secondary | ICD-10-CM | POA: Diagnosis not present

## 2022-10-30 DIAGNOSIS — R978 Other abnormal tumor markers: Secondary | ICD-10-CM

## 2022-10-30 NOTE — Progress Notes (Signed)
Xgeva to be held at this time due to dental procedures.

## 2022-10-30 NOTE — Progress Notes (Signed)
Kara Woods [address], [place] [address]   Patient Care Team: Celene Squibb, MD as PCP - General (Internal Medicine) Troy Sine, MD as PCP - Cardiology (Cardiology) Gala Romney Cristopher Estimable, MD (Gastroenterology) Brien Mates, RN as Oncology Nurse Navigator (Oncology) Barbaraann Faster, RN as [place] Management  SUMMARY OF ONCOLOGIC HISTORY: Oncology History   No history exists.    CHIEF COMPLIANT: Follow-up of left breast cancer   INTERVAL HISTORY: Kara Woods is a 88 y.o. female seen for follow-up of metastatic breast cancer.  She is tolerating Ibrance and anastrozole very well.  Denies any new onset pains.  Reports energy levels of 70%.  Chronic back pain is rated as 5 out of 10 and stable.  REVIEW OF SYSTEMS:   Review of Systems  Constitutional:  Negative for appetite change and fatigue.  Respiratory:  Positive for shortness of breath.   Cardiovascular:  Positive for leg swelling.  Musculoskeletal:  Positive for arthralgias (R shoulder). Negative for neck pain.  Neurological:  Positive for dizziness. Negative for numbness.  Psychiatric/Behavioral:  The patient is nervous/anxious.   All other systems reviewed and are negative.   I have reviewed the past medical history, past surgical history, social history and family history with the patient and they are unchanged from previous note.   ALLERGIES:   is allergic to contrast media [iodinated contrast media], lipitor [atorvastatin], sulfamethoxazole, and sulfonamide derivatives.   MEDICATIONS:  Current Outpatient Medications  Medication Sig Dispense Refill   acetaminophen (TYLENOL) 325 MG tablet Take 2 tablets (650 mg total) by mouth every 6 (six) hours as needed for mild pain or headache (or temp > 37.5 C (99.5 F)). 40 tablet 0   anastrozole (ARIMIDEX) 1 MG tablet TAKE 1 TABLET BY MOUTH EVERY DAY 90 tablet 2   aspirin 81 MG EC tablet Take 1 tablet (81 mg total) by  mouth daily. Swallow whole. 60 tablet 2   carboxymethylcellulose (REFRESH PLUS) 0.5 % SOLN Place 3-4 drops into both eyes 3 (three) times daily as needed (dry eyes). Preservative free     clorazepate (TRANXENE) 3.75 MG tablet Take 1 tablet (3.75 mg total) by mouth 2 (two) times daily as needed for anxiety. 90 tablet 1   escitalopram (LEXAPRO) 20 MG tablet Take 1 tablet (20 mg total) by mouth daily. 90 tablet 2   latanoprost (XALATAN) 0.005 % ophthalmic solution Place 1 drop into both eyes at bedtime.     metoprolol tartrate (LOPRESSOR) 25 MG tablet TAKE HALF A TABLET BY MOUTH 2 TIMES DAILY. 90 tablet 3   nitroGLYCERIN (NITROSTAT) 0.4 MG SL tablet Place 1 tablet (0.4 mg total) under the tongue every 5 (five) minutes as needed. 25 tablet 3   OLANZapine (ZYPREXA) 2.5 MG tablet Take 1 tablet (2.5 mg total) by mouth at bedtime. 30 tablet 3   palbociclib (IBRANCE) 75 MG tablet Take 1 tablet (75 mg total) by mouth daily. Take for 21 days on, 7 days off, repeat every 28 days. 21 tablet 6   No current facility-administered medications for this visit.     PHYSICAL EXAMINATION: Performance status (ECOG): 1 - Symptomatic but completely ambulatory  Vitals:   10/30/22 1436  BP: 105/67  Pulse: 74  Resp: 17  Temp: 97.8 F (36.6 C)  SpO2: 98%   Wt Readings from Last 3 Encounters:  10/30/22 150 lb 6.4 oz (68.2 kg)  10/01/22 154 lb (69.9 kg)  09/16/22 150 lb (  68 kg)   Physical Exam Vitals reviewed.  Constitutional:      Appearance: Normal appearance.  Cardiovascular:     Rate and Rhythm: Normal rate and regular rhythm.     Pulses: Normal pulses.     Heart sounds: Normal heart sounds.  Pulmonary:     Effort: Pulmonary effort is normal.     Breath sounds: Normal breath sounds.  Neurological:     General: No focal deficit present.     Mental Status: She is alert and oriented to person, place, and time.  Psychiatric:        Mood and Affect: Mood normal.        Behavior: Behavior normal.      Breast Exam Chaperone: Thana Ates     LABORATORY DATA:  I have reviewed the data as listed    Latest Ref Rng & Units 10/24/2022   10:12 AM 07/19/2022    9:45 AM 06/21/2022    9:27 AM  CMP  Glucose 70 - 99 mg/dL 93  100  140   BUN 8 - 23 mg/dL _0 Creatinine 0.44 - 1.00 mg/dL 0.84  0.79  0.84   Sodium 135 - 145 mmol/L 139  141  137   Potassium 3.5 - 5.1 mmol/L 4.4  4.1  4.1   Chloride 98 - 111 mmol/L 104  108  107   CO2 22 - 32 mmol/L _1 Calcium 8.9 - 10.3 mg/dL 8.5  8.6  8.7   Total Protein 6.5 - 8.1 g/dL 7.2  7.1  7.0   Total Bilirubin 0.3 - 1.2 mg/dL 0.5  0.8  0.6   Alkaline Phos 38 - 126 U/L 39  37  34   AST 15 - 41 U/L _2 ALT 0 - 44 U/L _3 Lab Results  Component Value Date   CAN153 20.7 10/24/2022   CAN153 40.0 (H) 07/19/2022   CAN153 30.4 (H) 04/25/2022   Lab Results  Component Value Date   WBC 3.2 (L) 10/24/2022   HGB 11.7 (L) 10/24/2022   HCT 35.6 (L) 10/24/2022   MCV 108.2 (H) 10/24/2022   PLT 266 10/24/2022   NEUTROABS 1.3 (L) 10/24/2022    ASSESSMENT:  1.  T2N0 left breast infiltrating lobular carcinoma: -Status post lumpectomy in June 2014, 1.4 cm, grade 1, lymphovascular invasion positive, ER 100%, PR 26%, Ki-67 18%. -She underwent XRT.  She did not take adjuvant endocrine therapy. -Mammogram on 07/13/2020, BI-RADS Category 1. -CEA was 10.3, Ca1 2515.8 and CA 19-9 05. -PET scan on 08/07/2020 shows widespread hypermetabolic bone meta stasis with soft tissue components at T1 and sacrum.  Thoracic nodal metastasis. -MRI of the thoracic spine on 08/15/2020 shows pathological fracture of T1 with mild osseous retropulsion and possible left C8 nerve root encroachment within the foramen at C7-T1.  No cord deformity. -Right third rib biopsy on 08/22/2020 consistent with metastatic breast cancer, ER 100% positive, PR 40% positive, Ki-67 10%, HER-2 2+ and negative by FISH. -Ibrance 100 mg 3 weeks on 1 week off on  anastrozole 1 mg daily started on 08/31/2020. -Ibrance held on 10/13/2020 secondary to severe tiredness. - PET scan on 02/19/2021 shows improvement in metastatic disease.   2.  Social/family history: -She quit smoking 20 years ago, smoked 2 packs/day for more than 20 years prior to quitting. -She lives at home by herself and  is independent of all activities. -Daughter who has multiple myeloma at age 7.     PLAN:  1.  Metastatic cancer to the bones, highly likely breast cancer: - PET scan (10/24/2022): No definitive signs of disease recurrence.  Mild uptake in the left axillary lymph node nonspecific and reactive.  Lytic lesions without FDG uptake.  T1 and T7 compression fractures with near complete loss of height at T1 unchanged. - Tumor markers including CA 15-3 and CA 27-29 have normalized.  CBC shows mild leukopenia from Ibrance with neutropenia.  LFTs are normal. - Recommend continuing Ibrance 75 mg 21 days on/7 days off along with anastrozole 1 mg tablet daily. - RTC 3 months for follow-up with repeat labs and tumor markers.   2.  CAD and RCA stenting: - Continue metoprolol and aspirin.   3.  Right shoulder pain/low back pain: - This is stable.  If any worsening, consider orthopedic evaluation.   4.  Bone metastasis: - Calcium is 8.5.  Last dose of denosumab on 07/19/2022. - She needs some dental extractions.  We will continue to hold denosumab.   5.  Depression/anxiety: - Continue Lexapro 20 mg daily.  She is also taking clorazepate as needed for anxiety. - She was recently started on olanzapine 2.5 mg daily for hallucinations.  She has been sleeping well since then.   6.  Diarrhea: - Continue Imodium as needed.  Breast Cancer therapy associated bone loss: I have recommended calcium, Vitamin D and weight bearing exercises.  Orders placed this encounter:  Orders Placed This Encounter  Procedures   CBC with Differential/Platelet   Comprehensive metabolic panel   Magnesium    Cancer antigen 15-3   Cancer antigen 27.29     The patient has a good understanding of the overall plan. She agrees with it. She will call with any problems that may develop before the next visit here.  Derek Jack, MD [place] [phone]

## 2022-10-30 NOTE — Patient Instructions (Signed)
[facility]  Discharge Instructions  You were seen and examined today by Dr. Delton Coombes.  Dr. Delton Coombes discussed your most recent lab work and CT scan which revealed that everything looks good.  Continue taking Ibrance as prescribed. Hold Xgeva due to dental work needing to be done.   Follow-up as scheduled in 3 months with labs.    Thank you for choosing [facility] to provide your oncology and hematology care.   To afford each patient quality time with our provider, please arrive at least 15 minutes before your scheduled appointment time. You may need to reschedule your appointment if you arrive late (10 or more minutes). Arriving late affects you and other patients whose appointments are after yours.  Also, if you miss three or more appointments without notifying the office, you may be dismissed from the clinic at the provider's discretion.    Again, thank you for choosing [facility].  Our hope is that these requests will decrease the amount of time that you wait before being seen by our physicians.   If you have a lab appointment with the [facility] please come in thru the Main Entrance and check in at the main information desk.           _____________________________________________________________  Should you have questions after your visit to [facility], please contact our office at [phone] and follow the prompts.  Our office hours are 8:00 a.m. to 4:30 p.m. Monday - Thursday and 8:00 a.m. to 2:30 p.m. Friday.  Please note that voicemails left after 4:00 p.m. may not be returned until the following business day.  We are closed weekends and all major holidays.  You do have access to a nurse 24-7, just call the main number to the clinic [phone] and do not press any options, hold on the line and a nurse will answer the phone.    For prescription refill requests, have your pharmacy contact  our office and allow 72 hours.    Masks are optional in the cancer centers. If you would like for your care team to wear a mask while they are taking care of you, please let them know. You may have one support person who is at least 86 years old accompany you for your appointments.

## 2022-11-06 DIAGNOSIS — L259 Unspecified contact dermatitis, unspecified cause: Secondary | ICD-10-CM | POA: Insufficient documentation

## 2022-11-28 ENCOUNTER — Other Ambulatory Visit (HOSPITAL_COMMUNITY): Payer: Self-pay

## 2022-11-28 ENCOUNTER — Other Ambulatory Visit: Payer: Self-pay | Admitting: Neurology

## 2022-11-29 ENCOUNTER — Other Ambulatory Visit: Payer: Self-pay | Admitting: Neurology

## 2022-11-29 DIAGNOSIS — J069 Acute upper respiratory infection, unspecified: Secondary | ICD-10-CM | POA: Insufficient documentation

## 2022-11-29 DIAGNOSIS — Z87891 Personal history of nicotine dependence: Secondary | ICD-10-CM | POA: Diagnosis not present

## 2022-12-02 ENCOUNTER — Telehealth: Payer: Self-pay

## 2022-12-02 NOTE — Telephone Encounter (Signed)
Oral Oncology Patient Advocate Encounter   Received notification re-enrollment for assistance for Ibrance through [place] has been approved. Patient may continue to receive their medication at $0 from this program.    [employer]'s phone number [phone].   Effective dates: 11/25/22 through 11/25/23  I have spoken to the patient.  [name], [facility] Patient [place]  [phone] (phone) [phone] (fax) 12/02/2022 10:17 AM

## 2022-12-17 ENCOUNTER — Encounter: Payer: Self-pay | Admitting: Neurology

## 2022-12-26 ENCOUNTER — Other Ambulatory Visit: Payer: Self-pay

## 2022-12-26 ENCOUNTER — Encounter: Payer: Self-pay | Admitting: Neurology

## 2022-12-26 MED ORDER — OLANZAPINE 2.5 MG PO TABS: 2.5000 mg | ORAL_TABLET | Freq: Every day | ORAL | 0 refills | Status: DC

## 2023-01-07 ENCOUNTER — Ambulatory Visit: Payer: Self-pay | Admitting: *Deleted

## 2023-01-07 NOTE — Patient Outreach (Signed)
  Care Coordination   01/07/2023 Name: Kara Woods MRN: [id number] DOB: 01/15/1933   Care Coordination Outreach Attempts:  An unsuccessful telephone outreach was attempted today to offer the patient information about available care coordination services as a benefit of their health plan.   Follow Up Plan:  Additional outreach attempts will be made to offer the patient care coordination information and services.   Encounter Outcome:  No Answer   Care Coordination Interventions:  No, not indicated    Scotti Motter L. Lavina Hamman, RN, BSN, [place] Coordinator Office number [phone]

## 2023-01-08 ENCOUNTER — Ambulatory Visit (INDEPENDENT_AMBULATORY_CARE_PROVIDER_SITE_OTHER): Payer: Medicare Other | Admitting: Neurology

## 2023-01-08 ENCOUNTER — Encounter: Payer: Self-pay | Admitting: Neurology

## 2023-01-08 VITALS — BP 133/77 | HR 80 | Ht 64.0 in | Wt 157.0 lb

## 2023-01-08 DIAGNOSIS — G3184 Mild cognitive impairment, so stated: Secondary | ICD-10-CM | POA: Diagnosis not present

## 2023-01-08 DIAGNOSIS — R441 Visual hallucinations: Secondary | ICD-10-CM

## 2023-01-08 MED ORDER — OLANZAPINE 2.5 MG PO TABS: 2.5000 mg | ORAL_TABLET | Freq: Two times a day (BID) | ORAL | 11 refills | Status: DC

## 2023-01-08 NOTE — Progress Notes (Signed)
[facility]  PATIENT: Kara Woods DOB: [birth year]  REQUESTING CLINICIAN: Celene Squibb, MD HISTORY FROM: Patient and daughter  REASON FOR VISIT: New onset Hallucinations    HISTORICAL  CHIEF COMPLAINT:  Chief Complaint  Patient presents with   Follow-up    Rm 12. Accompanied by daughter. No new concerns.   INTERVAL HISTORY 01/08/2023:  Patient presents today for follow-up, she is accompanied by her daughter.  At last visit I did start her on Zyprexa.  Daughter reports that Zyprexa has helped decrease the hallucinations.  Her sleep has also improved.  Patient reports she still continue to see the two little girls.  The last episode was yesterday.  They stayed a a few-minutes and the left.  She reported that these hallucinations are not bothering her.  She denies any other associated symptoms and no other complaints.  Reported that her sleep is fine.   HISTORY OF PRESENT ILLNESS:  This is a 89 year old woman past medical history of hypertension, anxiety/depression, insomnia, recurrent UTI and left eye blindness who is presenting with daughter with complaint of recurrent hallucinations.  Daughter has reported that the hallucinations have been going on for the past 8 months.  Initially it was attributed to UTI as patient is having recurrent UTIs but the frequency of the hallucinations have increased.  Patient reports seeing people in her house, 4 weeks ago, she had an episode when she was seen water coming out of every wall of her house, and underneath her there was a black sand.   She also reports seeing people in the house. Patient tells me a story about couple weeks ago her phone rang someone call her and told her that "I do know you have cancer and we are here to help you" then they hang then few minutes later 5 people came to the house, they did not say a word but there was just looking around she asked questions but they were not responding. Since then she has  been seeing mainly at night and describes tham as a lady with 2 adult children and 2 small children (girls) coming in the house almost every night.  Again she is reporting these people do not respond to her, they do not talk to her, unless on one occasion when she heard the mother talking to one of the sons.  She reports that these events are upsetting to her, she does not want to see them or hear them because they are bothersome to her. They come to her house, come to her bedroom, come to the bathroom, and since then she has difficulty falling asleep.  She is taking clorazepate to help with her sleep.  She has a history of breast cancer with bone mets, her most recent MRI brain did not show any Brain mets. Daughter reports that patient is very independent, lives alone but all her children live about a mile away. Daughter are handling her finances and also helping with her bills.    OTHER MEDICAL CONDITIONS: Hypertension, Anxiety/Depression, Insomnia, recurrent UTI, L eye blindness, Breast cancer with bone mets   REVIEW OF SYSTEMS: Full 14 system review of systems performed and negative with exception of: As noted in the  HPI  ALLERGIES: Allergies  Allergen Reactions   Contrast Media [Iodinated Contrast Media] Anaphylaxis    Adverse reaction; pt hyperventilating, c/o CP and SOB    Lipitor [Atorvastatin] Other (See Comments)    Muscle aches, cramps, fatigue, weight loss/poor appetite   Sulfamethoxazole  Other (See Comments)    Reaction was years ago unknown   Sulfonamide Derivatives Other (See Comments)    Dizziness     HOME MEDICATIONS: Outpatient Medications Prior to Visit  Medication Sig Dispense Refill   acetaminophen (TYLENOL) 325 MG tablet Take 2 tablets (650 mg total) by mouth every 6 (six) hours as needed for mild pain or headache (or temp > 37.5 C (99.5 F)). 40 tablet 0   anastrozole (ARIMIDEX) 1 MG tablet TAKE 1 TABLET BY MOUTH EVERY DAY 90 tablet 2   aspirin 81 MG EC tablet  Take 1 tablet (81 mg total) by mouth daily. Swallow whole. 60 tablet 2   carboxymethylcellulose (REFRESH PLUS) 0.5 % SOLN Place 3-4 drops into both eyes 3 (three) times daily as needed (dry eyes). Preservative free     escitalopram (LEXAPRO) 20 MG tablet Take 1 tablet (20 mg total) by mouth daily. 90 tablet 2   latanoprost (XALATAN) 0.005 % ophthalmic solution Place 1 drop into both eyes at bedtime.     metoprolol tartrate (LOPRESSOR) 25 MG tablet TAKE HALF A TABLET BY MOUTH 2 TIMES DAILY. 90 tablet 3   nitroGLYCERIN (NITROSTAT) 0.4 MG SL tablet Place 1 tablet (0.4 mg total) under the tongue every 5 (five) minutes as needed. 25 tablet 3   palbociclib (IBRANCE) 75 MG tablet Take 1 tablet (75 mg total) by mouth daily. Take for 21 days on, 7 days off, repeat every 28 days. 21 tablet 6   OLANZapine (ZYPREXA) 2.5 MG tablet Take 1 tablet (2.5 mg total) by mouth at bedtime. And an additional tab in the morning (Patient taking differently: Take 2.5 mg by mouth 2 (two) times daily. And an additional tab in the morning) 60 tablet 0   No facility-administered medications prior to visit.    PAST MEDICAL HISTORY: Past Medical History:  Diagnosis Date   Anxiety    Arthritis    Blind left eye    Breast cancer ([place])    left    Colitis    Coronary artery disease    a. 10/2016 Staged PCI: RCA 50p, 52m(2.25x12 Resolute Onyx DES), LCX 50p, 861m2.75x23 Xience Alpine DES). Residual LAD 50p/m, 4537m   Cystocele 10/11/2013   Depression    Diastolic dysfunction    a. 09/2016 Echo: EF 50%, diffuse hypokinesis, mild LVH, grade 1 diastolic dysfunction, mild mitral regurgitation, mildly dilated left atrium.   GERD (gastroesophageal reflux disease)    occasional Tums only   HOH (hard of hearing)    Pelvic relaxation 10/11/2013   Proctitis    colonsocopy 2007, Canasa suppositories   S/P endoscopy March 2009   mild erosive reflux esophagitis, Schatzki's ring, s/p dilation   Schatzki's ring    Shortness of  breath    occ if anxiety   Wears dentures    upper   Wears glasses     PAST SURGICAL HISTORY: Past Surgical History:  Procedure Laterality Date   ABDOMINAL HYSTERECTOMY     ANTERIOR AND POSTERIOR REPAIR N/A 05/17/2014   Procedure: ANTERIOR (CYSTOCELE) AND POSTERIOR REPAIR (RECTOCELE);  Surgeon: ScoReece PackerD;  Location: [facility];  Service: Urology;  Laterality: N/A;   APPENDECTOMY     BREAST LUMPECTOMY WITH NEEDLE LOCALIZATION AND AXILLARY SENTINEL LYMPH NODE BX Left 05/03/2013   Procedure: BREAST LUMPECTOMY WITH NEEDLE LOCALIZATION AND AXILLARY SENTINEL LYMPH NODE BX;  Surgeon: BenEdward JollyD;  [facility];  Laterality: Left;   BREAST SURGERY  Left 05/19/13   CARDIAC CATHETERIZATION  1998   CARDIAC CATHETERIZATION N/A 11/04/2016   Procedure: Left Heart Cath and Coronary Angiography;  Surgeon: Troy Sine, MD;  Location: [facility];  Service: Cardiovascular;  Laterality: N/A;   CARDIAC CATHETERIZATION N/A 11/05/2016   Procedure: Coronary Stent Intervention;  Surgeon: Troy Sine, MD;  Location: [facility];  Service: Cardiovascular;  Laterality: N/A;   CHOLECYSTECTOMY     COLONOSCOPY  05/06/2006   Diffuse inflammatory changes of the rectal mucosa, consistent  with proctitis.  Otherwise, normal colon to terminal ileum   CORONARY ANGIOPLASTY  11/04/2016   CORONARY STENT PLACEMENT     Drug-eluting coronary artery stent, non-bioabsorbable-polymer-coated   CYSTOSCOPY N/A 05/17/2014   Procedure: CYSTOSCOPY;  Surgeon: Reece Packer, MD;  Location: [facility];  Service: Urology;  Laterality: N/A;   ESOPHAGOGASTRODUODENOSCOPY  02/09/2008   Schatzki ring status post dilation/Distal esophageal erosion consistent with mild erosive reflux  esophagitis, otherwise unremarkable esophagus, normal stomach, D1, D2.   EYE SURGERY     lt cataract-implant   EYE SURGERY     lt-lens repaced,l   LUMBAR DISC SURGERY  1993    RE-EXCISION OF BREAST LUMPECTOMY Left 05/19/2013   Procedure: RE-EXCISION OF BREAST LUMPECTOMY;  Surgeon: Edward Jolly, MD;  Location: [facility];  Service: General;  Laterality: Left;   RETINAL DETACHMENT SURGERY  1978   Left   VAGINAL HYSTERECTOMY Bilateral 05/17/2014   Procedure: HYSTERECTOMY VAGINAL with Bilateral Salpingo-Oophorectomy; Bladder cystotomy repair;  Surgeon: Marvene Staff, MD;  Location: [facility];  Service: Gynecology;  Laterality: Bilateral;   VAGINAL PROLAPSE REPAIR N/A 05/17/2014   Procedure: VAGINAL VAULT PROLAPSE AND GRAFT;  Surgeon: Reece Packer, MD;  Location: [facility];  Service: Urology;  Laterality: N/A;    FAMILY HISTORY: Family History  Problem Relation Age of Onset   Cancer Brother        spinal   Heart attack Mother    Heart attack Father    Heart attack Son    Heart attack Son    Depression Son    Multiple myeloma Daughter    Anemia Daughter    Colon cancer Neg Hx     SOCIAL HISTORY: Social History   Socioeconomic History   Marital status: Widowed    Spouse name: Not on file   Number of children: 6   Years of education: Not on file   Highest education level: Not on file  Occupational History   Occupation: Retired  Tobacco Use   Smoking status: Former    Packs/day: 1.00    Types: Cigarettes    Quit date: 04/28/1990    Years since quitting: 32.7   Smokeless tobacco: Never   Tobacco comments:    quit 28 yrs ago  Substance and Sexual Activity   Alcohol use: Yes    Alcohol/week: 1.0 standard drink of alcohol    Types: 1 Glasses of wine per week    Comment: occ   Drug use: No   Sexual activity: Not Currently    Birth control/protection: Post-menopausal  Other Topics Concern   Not on file  Social History Narrative   Not on file   Social Determinants of Health   Financial Resource Strain: Low Risk  (10/07/2022)   Overall Financial Resource Strain (CARDIA)    Difficulty of Paying Living Expenses: Not hard  at all  Food Insecurity: No Food Insecurity (10/07/2022)   Hunger Vital Sign  Worried About Charity fundraiser in the Last Year: Never true    [place] in the Last Year: Never true  Transportation Needs: No Transportation Needs (10/07/2022)   PRAPARE - Hydrologist (Medical): No    Lack of Transportation (Non-Medical): No  Physical Activity: Insufficiently Active (09/27/2020)   Exercise Vital Sign    Days of Exercise per Week: 2 days    Minutes of Exercise per Session: 10 min  Stress: No Stress Concern Present (10/07/2022)   Cataract    Feeling of Stress : Only a little  Social Connections: Socially Isolated (09/27/2020)   Social Connection and Isolation Panel [NHANES]    Frequency of Communication with Friends and Family: More than three times a week    Frequency of Social Gatherings with Friends and Family: More than three times a week    Attends Religious Services: Never    Marine scientist or Organizations: No    Attends Archivist Meetings: Never    Marital Status: Widowed  Intimate Partner Violence: Not At Risk (09/27/2020)   Humiliation, Afraid, Rape, and Kick questionnaire    Fear of Current or Ex-Partner: No    Emotionally Abused: No    Physically Abused: No    Sexually Abused: No    PHYSICAL EXAM  GENERAL EXAM/CONSTITUTIONAL: Vitals:  Vitals:   01/08/23 1544  BP: 133/77  Pulse: 80  Weight: 157 lb (71.2 kg)  Height: 5' 4"$  (1.626 m)   Body mass index is 26.95 kg/m. Wt Readings from Last 3 Encounters:  01/08/23 157 lb (71.2 kg)  10/30/22 150 lb 6.4 oz (68.2 kg)  10/01/22 154 lb (69.9 kg)   Patient is in no distress; well developed, nourished and groomed; neck is supple  EYES: Pupils round and reactive to light, Visual fields full to confrontation, Extraocular movements intacts,   MUSCULOSKELETAL: Gait, strength, tone, movements noted in  Neurologic exam below  NEUROLOGIC: MENTAL STATUS:      No data to display         awake, alert, oriented to person, place and time Registration intact but unable to recall 3 items after 5 minutes.  Reports  7 quarters in $1.75, unable to spell EMPTY backward. Knows the last 2 [place] presidents language fluent, comprehension intact, naming intact fund of knowledge appropriate  CRANIAL NERVE:  2nd, 3rd, 4th, 6th -visual fields full to confrontation, extraocular muscles intact, no nystagmus. Blind in the left eye  5th - facial sensation symmetric 7th - facial strength symmetric 8th - hearing intact 9th - palate elevates symmetrically, uvula midline 11th - shoulder shrug symmetric 12th - tongue protrusion midline  MOTOR:  normal bulk and tone, full strength in the BUE, BLE  SENSORY:  normal and symmetric to light touch  COORDINATION:  finger-nose-finger, fine finger movements normal  REFLEXES:  deep tendon reflexes present and symmetric  GAIT/STATION:  normal   DIAGNOSTIC DATA (LABS, IMAGING, TESTING) - I reviewed patient records, labs, notes, testing and imaging myself where available.  Lab Results  Component Value Date   WBC 3.2 (L) 10/24/2022   HGB 11.7 (L) 10/24/2022   HCT 35.6 (L) 10/24/2022   MCV 108.2 (H) 10/24/2022   PLT 266 10/24/2022      Component Value Date/Time   NA 139 10/24/2022 1012   K 4.4 10/24/2022 1012   CL 104 10/24/2022 1012   CO2 28 10/24/2022 1012  GLUCOSE 93 10/24/2022 1012   BUN 9 10/24/2022 1012   CREATININE 0.84 10/24/2022 1012   CREATININE 0.72 11/11/2016 1208   CALCIUM 8.5 (L) 10/24/2022 1012   PROT 7.2 10/24/2022 1012   ALBUMIN 3.6 10/24/2022 1012   AST 21 10/24/2022 1012   ALT 12 10/24/2022 1012   ALKPHOS 39 10/24/2022 1012   BILITOT 0.5 10/24/2022 1012   GFRNONAA >60 10/24/2022 1012   GFRAA >60 08/28/2020 1449   Lab Results  Component Value Date   CHOL 162 10/20/2020   HDL 46 10/20/2020   LDLCALC 97 10/20/2020    TRIG 97 10/20/2020   CHOLHDL 3.5 10/20/2020   Lab Results  Component Value Date   HGBA1C 5.4 10/20/2020   Lab Results  Component Value Date   [id number] 10/19/2020   Lab Results  Component Value Date   TSH 1.339 10/19/2020    MRI Brain 08/20/22 No evidence of acute intracranial abnormality or metastatic disease    Routine EEG 10/03/22 This is a normal EEG recorded while drowsy and awake. No evidence of interictal epileptiform discharges. Normal EEGs, however, do not rule out epilepsy.    ASSESSMENT AND PLAN  89 y.o. year old female with history of breast cancer with mets to bone, hypertension, anxiety/depression, insomnia, recurrent UTI and left eye blindness who is presenting with daughter with complaints of recurrent hallucinations.  The frequency of the hallucinations decreased since being on Zyprexa but they are still present.  They do not disturb patient.  Etiology remains unclear, repeat MRI Brain did not show any metastatic disease and her EEG was normal.  I did recommend psychiatric evaluation but patient declined.  Will continue to monitor these hallucinations and daughter understands to contact me if they get worse.  I will see her in 1 year or sooner if worse.  Continue to follow with PCP    1. Hallucination, visual   2. Mild cognitive impairment      Patient Instructions  Continue with Zyprexa 2.5 mg twice daily Continue your other medications Continue to follow-up with PCP Follow-up in 1 year or sooner if worse    No orders of the defined types were placed in this encounter.   Meds ordered this encounter  Medications   OLANZapine (ZYPREXA) 2.5 MG tablet    Sig: Take 1 tablet (2.5 mg total) by mouth 2 (two) times daily. And an additional tab in the morning    Dispense:  60 tablet    Refill:  11    Return in about 1 year (around 01/09/2024).  I have spent a total of 41 minutes dedicated to this patient today, preparing to see patient, performing a  medically appropriate examination and evaluation, ordering tests and/or medications and procedures, and counseling and educating the patient/family/caregiver; independently interpreting result and communicating results to the family/patient/caregiver; and documenting clinical information in the electronic medical record.   Alric Ran, MD 01/08/2023, 4:42 PM  [facility] [address], [place], [place] [address] [phone]

## 2023-01-08 NOTE — Patient Instructions (Addendum)
Continue with Zyprexa 2.5 mg twice daily Continue your other medications Continue to follow-up with PCP Follow-up in 1 year or sooner if worse

## 2023-01-09 ENCOUNTER — Ambulatory Visit: Payer: Self-pay | Admitting: *Deleted

## 2023-01-09 NOTE — Patient Outreach (Signed)
  Care Coordination   Follow Up Visit Note   01/09/2023 Name: Kara Woods MRN: [id number] DOB: [birth year]  Kara Woods is a 89 y.o. year old female who sees Nevada Crane, Edwinna Areola, MD for primary care. I spoke with  Kara Woods by phone today.  What matters to the patients health and wellness today?  Return call from Maudie Mercury, daughter [name] is doing good Denies and new medical concerns. She continues to be independent living alone with her daughter completing  IADLs (instrumental Activities of Daily Living)  Seen by Dr April Manson, neurology on 01/08/23 for new onset hallucinations/mild cognitive impairment- Zyprexa 2.5 twice a day has helped decrease the hallucinations ( 2 little girls)  Etiology documented by neurology as unclear     Goals Addressed               This Visit's Progress     Patient Stated     home management of hallucinations [facility]) (pt-stated)   On track     Care Coordination Interventions: Evaluation of current treatment plan related to hallucinations home care and patient's adherence to plan as established by provider Discussed plans with patient for ongoing care management follow up and provided patient with direct contact information for care management team Confirmed she was seen by neurology on 01/08/23 and continues to take medicine for treatment      COMPLETED: manage UTIs ([facility]) (pt-stated)   On track     Care Coordination Interventions: Evaluation of current treatment plan related to UTI  and patient's adherence to plan as established by provider Discussed plans with patient for ongoing care management follow up and provided patient with direct contact information for care management team Reported resolved UTI goal complete       Interventions Today    Flowsheet Row Most Recent Value  Chronic Disease   Chronic disease during today's visit Other  [UTI, hallucinations]  General Interventions   General Interventions Discussed/Reviewed General  Interventions Reviewed, Doctor Visits  Doctor Visits Discussed/Reviewed Doctor Visits Discussed, Specialist  PCP/Specialist Visits Compliance with follow-up visit  [place] Discussed/Reviewed Coping Strategies, Mental Health Reviewed  Pharmacy Interventions   Pharmacy Dicussed/Reviewed Pharmacy Topics Reviewed, Affording Medications  Safety Interventions   Safety Discussed/Reviewed Safety Reviewed, Home Safety  Home Safety Assistive Devices       SDOH assessments and interventions completed:  Yes  SDOH Interventions Today    Flowsheet Row Most Recent Value  SDOH Interventions   Stress Interventions Intervention Not Indicated        Care Coordination Interventions:  Yes, provided   Follow up plan: Follow up call scheduled for 02/07/23    Encounter Outcome:  Pt. Visit Completed   Aislinn Feliz L. Lavina Hamman, RN, BSN, [place] Coordinator Office number [phone]

## 2023-01-09 NOTE — Patient Instructions (Signed)
Visit Information  Thank you for taking time to visit with me today. Please don't hesitate to contact me if I can be of assistance to you.   Following are the goals we discussed today:   Goals Addressed               This Visit's Progress     Patient Stated     home management of hallucinations [facility]) (pt-stated)   On track     Care Coordination Interventions: Evaluation of current treatment plan related to hallucinations home care and patient's adherence to plan as established by provider Discussed plans with patient for ongoing care management follow up and provided patient with direct contact information for care management team Confirmed she was seen by neurology on 01/08/23 and continues to take medicine for treatment      COMPLETED: manage UTIs [facility]) (pt-stated)   On track     Care Coordination Interventions: Evaluation of current treatment plan related to UTI  and patient's adherence to plan as established by provider Discussed plans with patient for ongoing care management follow up and provided patient with direct contact information for care management team Reported resolved UTI goal complete        Our next appointment is by telephone on 02/07/23 at 1130  Please call the care guide team at [phone] if you need to cancel or reschedule your appointment.   If you are experiencing a Mental Health or [place] or need someone to talk to, please call the Suicide and Crisis Lifeline: 988 call the [place] National Suicide Prevention Lifeline: 513-735-9729 or TTY: 7267363946 TTY 217-478-3791) to talk to a trained counselor call 1-800-273-TALK (toll free, 24 hour hotline) call the [name]: 505-725-7949 call 911   Patient verbalizes understanding of instructions and care plan provided today and agrees to view in [place]. Active MyChart status and patient understanding of how to access instructions and care plan via MyChart confirmed  with patient.     The patient has been provided with contact information for the care management team and has been advised to call with any health related questions or concerns.    Olina Melfi L. Lavina Hamman, RN, BSN, [place] Coordinator Office number [phone]

## 2023-01-14 ENCOUNTER — Encounter: Payer: Medicare Other | Admitting: *Deleted

## 2023-01-23 ENCOUNTER — Encounter: Payer: Self-pay | Admitting: Radiology

## 2023-01-29 ENCOUNTER — Inpatient Hospital Stay: Payer: Medicare Other | Attending: Hematology

## 2023-01-29 DIAGNOSIS — C7951 Secondary malignant neoplasm of bone: Secondary | ICD-10-CM | POA: Diagnosis not present

## 2023-01-29 DIAGNOSIS — R0602 Shortness of breath: Secondary | ICD-10-CM | POA: Insufficient documentation

## 2023-01-29 DIAGNOSIS — R059 Cough, unspecified: Secondary | ICD-10-CM | POA: Insufficient documentation

## 2023-01-29 DIAGNOSIS — Z170 Estrogen receptor positive status [ER+]: Secondary | ICD-10-CM

## 2023-01-29 DIAGNOSIS — R079 Chest pain, unspecified: Secondary | ICD-10-CM | POA: Diagnosis not present

## 2023-01-29 DIAGNOSIS — R197 Diarrhea, unspecified: Secondary | ICD-10-CM | POA: Diagnosis not present

## 2023-01-29 DIAGNOSIS — Z79899 Other long term (current) drug therapy: Secondary | ICD-10-CM | POA: Diagnosis not present

## 2023-01-29 DIAGNOSIS — R978 Other abnormal tumor markers: Secondary | ICD-10-CM

## 2023-01-29 DIAGNOSIS — Z87891 Personal history of nicotine dependence: Secondary | ICD-10-CM | POA: Insufficient documentation

## 2023-01-29 DIAGNOSIS — Z79811 Long term (current) use of aromatase inhibitors: Secondary | ICD-10-CM | POA: Diagnosis not present

## 2023-01-29 DIAGNOSIS — F419 Anxiety disorder, unspecified: Secondary | ICD-10-CM | POA: Diagnosis not present

## 2023-01-29 DIAGNOSIS — C50912 Malignant neoplasm of unspecified site of left female breast: Secondary | ICD-10-CM | POA: Diagnosis not present

## 2023-01-29 DIAGNOSIS — F32A Depression, unspecified: Secondary | ICD-10-CM | POA: Insufficient documentation

## 2023-01-29 LAB — COMPREHENSIVE METABOLIC PANEL
ALT: 10 U/L (ref 0–44)
AST: 17 U/L (ref 15–41)
Albumin: 3.7 g/dL (ref 3.5–5.0)
Alkaline Phosphatase: 39 U/L (ref 38–126)
Anion gap: 7 (ref 5–15)
BUN: 13 mg/dL (ref 8–23)
CO2: 27 mmol/L (ref 22–32)
Calcium: 8.9 mg/dL (ref 8.9–10.3)
Chloride: 104 mmol/L (ref 98–111)
Creatinine, Ser: 0.87 mg/dL (ref 0.44–1.00)
GFR, Estimated: >60 mL/min (ref >60)
Glucose, Bld: 115 mg/dL — ABNORMAL HIGH (ref 70–99)
Potassium: 3.7 mmol/L (ref 3.5–5.1)
Sodium: 138 mmol/L (ref 135–145)
Total Bilirubin: 0.7 mg/dL (ref 0.3–1.2)
Total Protein: 7.1 g/dL (ref 6.5–8.1)

## 2023-01-29 LAB — CBC WITH DIFFERENTIAL/PLATELET
Abs Immature Granulocytes: 0.02 10*3/uL (ref 0.00–0.07)
Basophils Absolute: 0.1 10*3/uL (ref 0.0–0.1)
Basophils Relative: 2 %
Eosinophils Absolute: 0.1 10*3/uL (ref 0.0–0.5)
Eosinophils Relative: 2 %
HCT: 34.9 % — ABNORMAL LOW (ref 36.0–46.0)
Hemoglobin: 11.6 g/dL — ABNORMAL LOW (ref 12.0–15.0)
Immature Granulocytes: 1 %
Lymphocytes Relative: 43 %
Lymphs Abs: 1.7 10*3/uL (ref 0.7–4.0)
MCH: 35.6 pg — ABNORMAL HIGH (ref 26.0–34.0)
MCHC: 33.2 g/dL (ref 30.0–36.0)
MCV: 107.1 fL — ABNORMAL HIGH (ref 80.0–100.0)
Monocytes Absolute: 0.2 10*3/uL (ref 0.1–1.0)
Monocytes Relative: 6 %
Neutro Abs: 1.9 10*3/uL (ref 1.7–7.7)
Neutrophils Relative %: 46 %
Platelets: 127 10*3/uL — ABNORMAL LOW (ref 150–400)
RBC: 3.26 MIL/uL — ABNORMAL LOW (ref 3.87–5.11)
RDW: 14.3 % (ref 11.5–15.5)
WBC: 4 10*3/uL (ref 4.0–10.5)
nRBC: 0 % (ref 0.0–0.2)

## 2023-01-29 LAB — MAGNESIUM: Magnesium: 1.7 mg/dL (ref 1.7–2.4)

## 2023-01-30 LAB — CANCER ANTIGEN 27.29: CA 27.29: 31.7 U/mL (ref 0.0–38.6)

## 2023-01-31 LAB — CANCER ANTIGEN 15-3: CA 15-3: 28.3 U/mL — ABNORMAL HIGH (ref 0.0–25.0)

## 2023-02-05 ENCOUNTER — Inpatient Hospital Stay (HOSPITAL_BASED_OUTPATIENT_CLINIC_OR_DEPARTMENT_OTHER): Payer: Medicare Other | Admitting: Hematology

## 2023-02-05 VITALS — BP 101/64 | HR 83 | Temp 98.4°F | Resp 16 | Wt 159.0 lb

## 2023-02-05 DIAGNOSIS — Z170 Estrogen receptor positive status [ER+]: Secondary | ICD-10-CM | POA: Diagnosis not present

## 2023-02-05 DIAGNOSIS — F419 Anxiety disorder, unspecified: Secondary | ICD-10-CM | POA: Diagnosis not present

## 2023-02-05 DIAGNOSIS — Z79811 Long term (current) use of aromatase inhibitors: Secondary | ICD-10-CM | POA: Diagnosis not present

## 2023-02-05 DIAGNOSIS — C50212 Malignant neoplasm of upper-inner quadrant of left female breast: Secondary | ICD-10-CM | POA: Diagnosis not present

## 2023-02-05 DIAGNOSIS — C7951 Secondary malignant neoplasm of bone: Secondary | ICD-10-CM | POA: Diagnosis not present

## 2023-02-05 DIAGNOSIS — R978 Other abnormal tumor markers: Secondary | ICD-10-CM | POA: Diagnosis not present

## 2023-02-05 DIAGNOSIS — Z87891 Personal history of nicotine dependence: Secondary | ICD-10-CM | POA: Diagnosis not present

## 2023-02-05 DIAGNOSIS — C50912 Malignant neoplasm of unspecified site of left female breast: Secondary | ICD-10-CM | POA: Diagnosis not present

## 2023-02-05 DIAGNOSIS — F32A Depression, unspecified: Secondary | ICD-10-CM | POA: Diagnosis not present

## 2023-02-05 NOTE — Progress Notes (Signed)
[facility] [address] [place] [address]    Clinic Day:  02/05/2023  Referring physician: Celene Squibb, MD  Patient Care Team: Celene Squibb, MD as PCP - General (Internal Medicine) Troy Sine, MD as PCP - Cardiology (Cardiology) G[facility] Cristopher Estimable, MD (Gastroenterology) Brien Mates, RN as Oncology Nurse Navigator (Oncology) Barbaraann Faster, RN as [place] Management   ASSESSMENT & PLAN:   Assessment: 1.  T2N0 left breast infiltrating lobular carcinoma: -Status post lumpectomy in June 2014, 1.4 cm, grade 1, lymphovascular invasion positive, ER 100%, PR 26%, Ki-67 18%. -She underwent XRT.  She did not take adjuvant endocrine therapy. -Mammogram on 07/13/2020, BI-RADS Category 1. -CEA was 10.3, Ca1 2515.8 and CA 19-9 05. -PET scan on 08/07/2020 shows widespread hypermetabolic bone meta stasis with soft tissue components at T1 and sacrum.  Thoracic nodal metastasis. -MRI of the thoracic spine on 08/15/2020 shows pathological fracture of T1 with mild osseous retropulsion and possible left C8 nerve root encroachment within the foramen at C7-T1.  No cord deformity. -Right third rib biopsy on 08/22/2020 consistent with metastatic breast cancer, ER 100% positive, PR 40% positive, Ki-67 10%, HER-2 2+ and negative by FISH. -Ibrance 100 mg 3 weeks on 1 week off on anastrozole 1 mg daily started on 08/31/2020. -Ibrance held on 10/13/2020 secondary to severe tiredness. - PET scan on 02/19/2021 shows improvement in metastatic disease.   2.  Social/family history: -She quit smoking 20 years ago, smoked 2 packs/day for more than 20 years prior to quitting. -She lives at home by herself and is independent of all activities. -Daughter who has multiple myeloma at age 52.  Plan:  1.  Metastatic breast cancer to the bones: - PET scan (10/24/2022): No definitive signs of disease recurrence.  Mild uptake in the left axillary lymph node nonspecific and  reactive.  Lytic lesion without FDG uptake.  T1 and T7 compression fractures with near loss of height at T1 unchanged. - Reviewed labs from 01/29/2023 which showed normal LFTs and creatinine and calcium.  CBC was grossly normal with mild anemia and thrombocytopenia. - CA 15-3 28.3, up from 20.  CA 27-29 is normal. - Recommend continuing Ibrance 75 mg 3 weeks on/1 week off along with anastrozole 1 mg daily. - We have again discussed the prognosis of metastatic breast cancer as patient had many questions.  All her questions were answered to her satisfaction. - RTC 3 months for follow-up with repeat labs, tumor markers.  Will also do PET scan at that time.  2.  Bone metastasis: - She had dental extractions, last 1 done on 01/09/2023.  Will continue to hold denosumab until next visit.  3.  Depression/anxiety: - Continue Lexapro 20 mg daily.  Take clorazepate as needed for anxiety. - Continue olanzapine 2.5 mg daily which was started for hallucinations.  She is sleeping well since olanzapine started.  4.  Diarrhea: - Continue Imodium as needed.   Orders Placed This Encounter  Procedures   NM PET Image Restag (PS) Skull Base To Thigh    Standing Status:   Future    Standing Expiration Date:   02/05/2024    Order Specific Question:   If indicated for the ordered procedure, I authorize the administration of a radiopharmaceutical per Radiology protocol    Answer:   Yes    Order Specific Question:   Preferred imaging location?    Answer:   [facility]  Order Specific Question:   Release to patient    Answer:   Immediate   Cancer antigen 15-3    Standing Status:   Future    Standing Expiration Date:   02/05/2024   Cancer antigen 27.29    Standing Status:   Future    Standing Expiration Date:   02/05/2024   CBC with Differential/Platelet    Standing Status:   Future    Standing Expiration Date:   02/05/2024    Order Specific Question:   Release to patient    Answer:   Immediate   Comprehensive  metabolic panel    Standing Status:   Future    Standing Expiration Date:   02/05/2024    Order Specific Question:   Release to patient    Answer:   Immediate      I,Alexis Herring,acting as a scribe for Derek Jack, MD.,have documented all relevant documentation on the behalf of Derek Jack, MD,as directed by  Derek Jack, MD while in the presence of Derek Jack, MD.   I, Derek Jack MD, have reviewed the above documentation for accuracy and completeness, and I agree with the above.   Derek Jack, MD   3/13/20246:38 PM  CHIEF COMPLAINT:   Diagnosis: left breast cancer    Cancer Staging  History of breast cancer Staging form: Breast, AJCC 7th Edition - Clinical stage from 04/20/2013: Stage IIA (T2, N0, cM0) - Unsigned - Pathologic: No stage assigned - Unsigned    Prior Therapy:  Status post lumpectomy in June 2014  2.  XRT  Current Therapy:  Ibrance and anastrozole    HISTORY OF PRESENT ILLNESS:   Oncology History   No history exists.     INTERVAL HISTORY:   Elliot is a 89 y.o. female presenting to clinic today for follow up of left breast cancer. She was last seen by me on 10/30/22.  Today, she states that she is doing well overall. Her appetite level is at 100%. Her energy level is at 50%. She denies any fevers or infections.  Reports left-sided rib pain sharp in nature, lasting few seconds for the last 1 week, occurs anywhere between 0-2 times per week.  Pain comes on when she bends forward.  Started her last cycle of Ibrance on 02/02/2023.   PAST MEDICAL HISTORY:   Past Medical History: Past Medical History:  Diagnosis Date   Anxiety    Arthritis    Blind left eye    Breast cancer ([place])    left    Colitis    Coronary artery disease    a. 10/2016 Staged PCI: RCA 50p, 37m(2.25x12 Resolute Onyx DES), LCX 50p, 842m2.75x23 Xience Alpine DES). Residual LAD 50p/m, 459m   Cystocele 10/11/2013   Depression     Diastolic dysfunction    a. 09/2016 Echo: EF 50%, diffuse hypokinesis, mild LVH, grade 1 diastolic dysfunction, mild mitral regurgitation, mildly dilated left atrium.   GERD (gastroesophageal reflux disease)    occasional Tums only   HOH (hard of hearing)    Pelvic relaxation 10/11/2013   Proctitis    colonsocopy 2007, [place] suppositories   S/P endoscopy March 2009   mild erosive reflux esophagitis, Schatzki's ring, s/p dilation   Schatzki's ring    Shortness of breath    occ if anxiety   Wears dentures    upper   Wears glasses     Surgical History: Past Surgical History:  Procedure Laterality Date   ABDOMINAL HYSTERECTOMY  ANTERIOR AND POSTERIOR REPAIR N/A 05/17/2014   Procedure: ANTERIOR (CYSTOCELE) AND POSTERIOR REPAIR (RECTOCELE);  Surgeon: Reece Packer, MD;  Location: [facility];  Service: Urology;  Laterality: N/A;   APPENDECTOMY     BREAST LUMPECTOMY WITH NEEDLE LOCALIZATION AND AXILLARY SENTINEL LYMPH NODE BX Left 05/03/2013   Procedure: BREAST LUMPECTOMY WITH NEEDLE LOCALIZATION AND AXILLARY SENTINEL LYMPH NODE BX;  Surgeon: Edward Jolly, MD;  Location: [facility];  Service: General;  Laterality: Left;   BREAST SURGERY Left 05/19/13   CARDIAC CATHETERIZATION  1998   CARDIAC CATHETERIZATION N/A 11/04/2016   Procedure: Left Heart Cath and Coronary Angiography;  Surgeon: Troy Sine, MD;  Location: [facility];  Service: Cardiovascular;  Laterality: N/A;   CARDIAC CATHETERIZATION N/A 11/05/2016   Procedure: Coronary Stent Intervention;  Surgeon: Troy Sine, MD;  Location: [facility];  Service: Cardiovascular;  Laterality: N/A;   CHOLECYSTECTOMY     COLONOSCOPY  05/06/2006   Diffuse inflammatory changes of the rectal mucosa, consistent  with proctitis.  Otherwise, normal colon to terminal ileum   CORONARY ANGIOPLASTY  11/04/2016   CORONARY STENT PLACEMENT     Drug-eluting coronary artery stent, non-bioabsorbable-polymer-coated    CYSTOSCOPY N/A 05/17/2014   Procedure: CYSTOSCOPY;  Surgeon: Reece Packer, MD;  Location: [facility];  Service: Urology;  Laterality: N/A;   ESOPHAGOGASTRODUODENOSCOPY  02/09/2008   Schatzki ring status post dilation/Distal esophageal erosion consistent with mild erosive reflux  esophagitis, otherwise unremarkable esophagus, normal stomach, D1, D2.   EYE SURGERY     lt cataract-implant   EYE SURGERY     lt-lens repaced,l   LUMBAR DISC SURGERY  1993   RE-EXCISION OF BREAST LUMPECTOMY Left 05/19/2013   Procedure: RE-EXCISION OF BREAST LUMPECTOMY;  Surgeon: Edward Jolly, MD;  Location: [facility];  Service: General;  Laterality: Left;   RETINAL DETACHMENT SURGERY  1978   Left   VAGINAL HYSTERECTOMY Bilateral 05/17/2014   Procedure: HYSTERECTOMY VAGINAL with Bilateral Salpingo-Oophorectomy; Bladder cystotomy repair;  Surgeon: Marvene Staff, MD;  Location: [facility];  Service: Gynecology;  Laterality: Bilateral;   VAGINAL PROLAPSE REPAIR N/A 05/17/2014   Procedure: VAGINAL VAULT PROLAPSE AND GRAFT;  Surgeon: Reece Packer, MD;  Location: [facility];  Service: Urology;  Laterality: N/A;    Social History: Social History   Socioeconomic History   Marital status: Widowed    Spouse name: Not on file   Number of children: 6   Years of education: Not on file   Highest education level: Not on file  Occupational History   Occupation: Retired  Tobacco Use   Smoking status: Former    Packs/day: 1.00    Types: Cigarettes    Quit date: 04/28/1990    Years since quitting: 32.7   Smokeless tobacco: Never   Tobacco comments:    quit 28 yrs ago  Substance and Sexual Activity   Alcohol use: Yes    Alcohol/week: 1.0 standard drink of alcohol    Types: 1 Glasses of wine per week    Comment: occ   Drug use: No   Sexual activity: Not Currently    Birth control/protection: Post-menopausal  Other Topics Concern   Not on file  Social History Narrative   Not on file    Social Determinants of Health   Financial Resource Strain: Low Risk  (10/07/2022)   Overall Financial Resource Strain (CARDIA)    Difficulty of Paying Living Expenses: Not hard at all  Food Insecurity: No Food Insecurity (10/07/2022)   Hunger Vital Sign    Worried About Running Out of Food in the Last Year: Never true    Ran Out of Food in the Last Year: Never true  Transportation Needs: No Transportation Needs (10/07/2022)   PRAPARE - Hydrologist (Medical): No    Lack of Transportation (Non-Medical): No  Physical Activity: Insufficiently Active (09/27/2020)   Exercise Vital Sign    Days of Exercise per Week: 2 days    Minutes of Exercise per Session: 10 min  Stress: No Stress Concern Present (01/09/2023)   [place]    Feeling of Stress : Only a little  Social Connections: Socially Isolated (09/27/2020)   Social Connection and Isolation Panel [NHANES]    Frequency of Communication with Friends and Family: More than three times a week    Frequency of Social Gatherings with Friends and Family: More than three times a week    Attends Religious Services: Never    Marine scientist or Organizations: No    Attends Archivist Meetings: Never    Marital Status: Widowed  Intimate Partner Violence: Not At Risk (01/09/2023)   Humiliation, Afraid, Rape, and Kick questionnaire    Fear of Current or Ex-Partner: No    Emotionally Abused: No    Physically Abused: No    Sexually Abused: No    Family History: Family History  Problem Relation Age of Onset   Cancer Brother        spinal   Heart attack Mother    Heart attack Father    Heart attack Son    Heart attack Son    Depression Son    Multiple myeloma Daughter    Anemia Daughter    Colon cancer Neg Hx     Current Medications:  Current Outpatient Medications:    acetaminophen (TYLENOL) 325 MG tablet, Take 2 tablets  (650 mg total) by mouth every 6 (six) hours as needed for mild pain or headache (or temp > 37.5 C (99.5 F))., Disp: 40 tablet, Rfl: 0   anastrozole (ARIMIDEX) 1 MG tablet, TAKE 1 TABLET BY MOUTH EVERY DAY, Disp: 90 tablet, Rfl: 2   aspirin 81 MG EC tablet, Take 1 tablet (81 mg total) by mouth daily. Swallow whole., Disp: 60 tablet, Rfl: 2   carboxymethylcellulose (REFRESH PLUS) 0.5 % SOLN, Place 3-4 drops into both eyes 3 (three) times daily as needed (dry eyes). Preservative free, Disp: , Rfl:    escitalopram (LEXAPRO) 20 MG tablet, Take 1 tablet (20 mg total) by mouth daily., Disp: 90 tablet, Rfl: 2   latanoprost (XALATAN) 0.005 % ophthalmic solution, Place 1 drop into both eyes at bedtime., Disp: , Rfl:    metoprolol tartrate (LOPRESSOR) 25 MG tablet, TAKE HALF A TABLET BY MOUTH 2 TIMES DAILY., Disp: 90 tablet, Rfl: 3   OLANZapine (ZYPREXA) 2.5 MG tablet, Take 1 tablet (2.5 mg total) by mouth 2 (two) times daily. And an additional tab in the morning, Disp: 60 tablet, Rfl: 11   palbociclib (IBRANCE) 75 MG tablet, Take 1 tablet (75 mg total) by mouth daily. Take for 21 days on, 7 days off, repeat every 28 days., Disp: 21 tablet, Rfl: 6   nitroGLYCERIN (NITROSTAT) 0.4 MG SL tablet, Place 1 tablet (0.4 mg total) under the tongue every 5 (five) minutes as needed. (Patient not taking: Reported on 02/05/2023), Disp: 25 tablet, Rfl: 3  Allergies: Allergies  Allergen Reactions   Contrast Media [Iodinated Contrast Media] Anaphylaxis    Adverse reaction; pt hyperventilating, c/o CP and SOB    Lipitor [Atorvastatin] Other (See Comments)    Muscle aches, cramps, fatigue, weight loss/poor appetite   Sulfamethoxazole Other (See Comments)    Reaction was years ago unknown   Sulfonamide Derivatives Other (See Comments)    Dizziness     REVIEW OF SYSTEMS:   Review of Systems  Constitutional:  Negative for chills, fatigue and fever.  HENT:   Negative for lump/mass, mouth sores, nosebleeds, sore throat  and trouble swallowing.   Eyes:  Negative for eye problems.  Respiratory:  Positive for cough and shortness of breath.   Cardiovascular:  Positive for chest pain. Negative for leg swelling and palpitations.  Gastrointestinal:  Negative for abdominal pain, constipation, diarrhea, nausea and vomiting.  Genitourinary:  Negative for bladder incontinence, difficulty urinating, dysuria, frequency, hematuria and nocturia.   Musculoskeletal:  Negative for arthralgias, back pain, flank pain, myalgias and neck pain.  Skin:  Negative for itching and rash.  Neurological:  Positive for numbness. Negative for dizziness and headaches.  Hematological:  Does not bruise/bleed easily.  Psychiatric/Behavioral:  Negative for depression, sleep disturbance and suicidal ideas. The patient is not nervous/anxious.   All other systems reviewed and are negative.    VITALS:   Blood pressure 101/64, pulse 83, temperature 98.4 F (36.9 C), temperature source Oral, resp. rate 16, weight 159 lb (72.1 kg), SpO2 96 %.  Wt Readings from Last 3 Encounters:  02/05/23 159 lb (72.1 kg)  01/08/23 157 lb (71.2 kg)  10/30/22 150 lb 6.4 oz (68.2 kg)    Body mass index is 27.29 kg/m.  Performance status (ECOG): 1 - Symptomatic but completely ambulatory  PHYSICAL EXAM:   Physical Exam Vitals and nursing note reviewed. Exam conducted with a chaperone present.  Constitutional:      Appearance: Normal appearance.  Cardiovascular:     Rate and Rhythm: Normal rate and regular rhythm.     Pulses: Normal pulses.     Heart sounds: Normal heart sounds.  Pulmonary:     Effort: Pulmonary effort is normal.     Breath sounds: Normal breath sounds.  Abdominal:     Palpations: Abdomen is soft. There is no hepatomegaly, splenomegaly or mass.     Tenderness: There is no abdominal tenderness.  Musculoskeletal:     Right lower leg: No edema.     Left lower leg: No edema.  Lymphadenopathy:     Cervical: No cervical adenopathy.      Right cervical: No superficial, deep or posterior cervical adenopathy.    Left cervical: No superficial, deep or posterior cervical adenopathy.     Upper Body:     Right upper body: No supraclavicular or axillary adenopathy.     Left upper body: No supraclavicular or axillary adenopathy.  Neurological:     General: No focal deficit present.     Mental Status: She is alert and oriented to person, place, and time.  Psychiatric:        Mood and Affect: Mood normal.        Behavior: Behavior normal.     LABS:      Latest Ref Rng & Units 01/29/2023    1:25 PM 10/24/2022   10:12 AM 07/19/2022    9:45 AM  CBC  WBC 4.0 - 10.5 K/uL 4.0  3.2  3.6   Hemoglobin 12.0 - 15.0 g/dL 11.6  11.7  11.7   Hematocrit 36.0 - 46.0 % 34.9  35.6  35.8   Platelets 150 - 400 K/uL 127  266  148       Latest Ref Rng & Units 01/29/2023    1:25 PM 10/24/2022   10:12 AM 07/19/2022    9:45 AM  CMP  Glucose 70 - 99 mg/dL 115  93  100   BUN 8 - 23 mg/dL '13  9  12   '$ Creatinine 0.44 - 1.00 mg/dL 0.87  0.84  0.79   Sodium 135 - 145 mmol/L 138  139  141   Potassium 3.5 - 5.1 mmol/L 3.7  4.4  4.1   Chloride 98 - 111 mmol/L 104  104  108   CO2 22 - 32 mmol/L '27  28  27   '$ Calcium 8.9 - 10.3 mg/dL 8.9  8.5  8.6   Total Protein 6.5 - 8.1 g/dL 7.1  7.2  7.1   Total Bilirubin 0.3 - 1.2 mg/dL 0.7  0.5  0.8   Alkaline Phos 38 - 126 U/L 39  39  37   AST 15 - 41 U/L '17  21  21   '$ ALT 0 - 44 U/L '10  12  13      '$ No results found for: "CEA1", "CEA" / No results found for: "CEA1", "CEA" No results found for: "PSA1" Lab Results  Component Value Date   [id number] 10/09/2021   No results found for: "[id number]"  No results found for: "TOTALPROTELP", "ALBUMINELP", "A1GS", "A2GS", "BETS", "BETA2SER", "GAMS", "MSPIKE", "SPEI" No results found for: "TIBC", "FERRITIN", "IRONPCTSAT" No results found for: "LDH"   STUDIES:   No results found.

## 2023-02-05 NOTE — Progress Notes (Signed)
Patient is taking Anastrozole and Ibrance as prescribed.  She has not missed any doses and reports no side effects at this time.    Patient has been assessed, vital signs and labs have been reviewed by Dr. Delton Coombes. ANC, Creatinine, LFTs, and Platelets are within treatment parameters per Dr. Delton Coombes. The patient will hold Xgeva treatment at this time due to recent dental work.  Primary RN and pharmacy aware.

## 2023-02-05 NOTE — Patient Instructions (Signed)
[facility]  Discharge Instructions  You were seen and examined today by Dr. Delton Coombes.  Dr. Delton Coombes discussed your most recent lab work which revealed that everything looks good.  Continue taking Ibrance and Anastrozole as prescribed. Dr. Delton Coombes is going to repeat a PET scan and labs before your next appointment.  Follow-up as scheduled in 3 months.    Thank you for choosing [facility] to provide your oncology and hematology care.   To afford each patient quality time with our provider, please arrive at least 15 minutes before your scheduled appointment time. You may need to reschedule your appointment if you arrive late (10 or more minutes). Arriving late affects you and other patients whose appointments are after yours.  Also, if you miss three or more appointments without notifying the office, you may be dismissed from the clinic at the provider's discretion.    Again, thank you for choosing [facility]  Our hope is that these requests will decrease the amount of time that you wait before being seen by our physicians.   If you have a lab appointment with the [facility] please come in thru the Main Entrance and check in at the main information desk.           _____________________________________________________________  Should you have questions after your visit to [facility], please contact our office at [phone] and follow the prompts.  Our office hours are 8:00 a.m. to 4:30 p.m. Monday - Thursday and 8:00 a.m. to 2:30 p.m. Friday.  Please note that voicemails left after 4:00 p.m. may not be returned until the following business day.  We are closed weekends and all major holidays.  You do have access to a nurse 24-7, just call the main number to the clinic [phone] and do not press any options, hold on the line and a nurse will answer the phone.    For prescription refill requests,  have your pharmacy contact our office and allow 72 hours.    Masks are optional in the cancer centers. If you would like for your care team to wear a mask while they are taking care of you, please let them know. You may have one support person who is at least 87 years old accompany you for your appointments.

## 2023-02-07 ENCOUNTER — Encounter: Payer: Medicare Other | Admitting: *Deleted

## 2023-02-10 ENCOUNTER — Encounter: Payer: Self-pay | Admitting: Neurology

## 2023-02-14 ENCOUNTER — Ambulatory Visit: Payer: Self-pay | Admitting: *Deleted

## 2023-02-15 ENCOUNTER — Encounter: Payer: Self-pay | Admitting: *Deleted

## 2023-02-15 NOTE — Patient Instructions (Addendum)
Visit Information  Thank you for taking time to visit with me today. Please don't hesitate to contact me if I can be of assistance to you.   Following are the goals we discussed today:   Goals Addressed               This Visit's Progress     Patient Stated     COMPLETED: home management of hallucinations ([facility]) (pt-stated)        Care Coordination Interventions: Evaluation of current treatment plan related to hallucinations home care and patient's adherence to plan as established by provider Discussed plans with patient for ongoing care management follow up and provided patient with direct contact information for care management team Confirmed no further noted hallucinations since seen by  Dr April Manson, neurology on 01/08/23 and continues to Zyprexa for treatment Completed goal      Other     [facility] care coordination services        Interventions Today    Flowsheet Row Most Recent Value  Chronic Disease   Chronic disease during today's visit Other  [breast cancer with mets to bone, hallucinations, UTIs]  General Interventions   General Interventions Discussed/Reviewed General Interventions Reviewed, Doctor Visits  [follow up with oncology]  Doctor Visits Discussed/Reviewed Doctor Visits Reviewed, Specialist  PCP/Specialist Visits Compliance with follow-up visit  [confirmed [place] Discussed/Reviewed Coping Strategies, Mental Health Reviewed  [No hallucinations noted per Maudie Mercury, daughter since started on [place]/Reviewed Pharmacy Topics Reviewed  [on zyprexa]               Our next appointment is by telephone on 05/16/23 at 3:30 pm  Please call the care guide team at [phone] if you need to cancel or reschedule your appointment.   If you are experiencing a Mental Health or [place] or need someone to talk to, please call the Suicide and Crisis Lifeline: 988 call  the [place] National Suicide Prevention Lifeline: 248-472-9643 or TTY: 346-150-8990 TTY 479-296-4629) to talk to a trained counselor call 1-800-273-TALK (toll free, 24 hour hotline) call the [facility]: 850-697-0973 call 911   Patient verbalizes understanding of instructions and care plan provided today and agrees to view in [facility]. Active MyChart status and patient understanding of how to access instructions and care plan via MyChart confirmed with patient.     The patient has been provided with contact information for the care management team and has been advised to call with any health related questions or concerns.   Tanishia Lemaster L. Lavina Hamman, RN, BSN, [place] Coordinator Office number [phone]

## 2023-02-15 NOTE — Patient Outreach (Signed)
  Care Coordination   Follow Up Visit Note   02/15/2023 Name: Kara Woods MRN: [id number] DOB: May 31, 1933  Kara Woods is a 89 y.o. year old female who sees Nevada Crane, Edwinna Areola, MD for primary care. I spoke with daughter, Kara Woods, for Kara Woods by phone today.  What matters to the patients health and wellness today?  Hallucinations and UTI resolved.  Continues oncology services for breast cancer with mets to bone    Goals Addressed               This Visit's Progress     Patient Stated     COMPLETED: home management of hallucinations (THN) (pt-stated)        Care Coordination Interventions: Evaluation of current treatment plan related to hallucinations home care and patient's adherence to plan as established by provider Discussed plans with patient for ongoing care management follow up and provided patient with direct contact information for care management team Confirmed no further noted hallucinations since seen by  Dr April Manson, neurology on 01/08/23 and continues to Zyprexa for treatment Completed goal      Other     [facility] care coordination services        Interventions Today    Flowsheet Row Most Recent Value  Chronic Disease   Chronic disease during today's visit Other  [breast cancer with mets to bone, hallucinations, UTIs]  General Interventions   General Interventions Discussed/Reviewed General Interventions Reviewed, Doctor Visits  [follow up with oncology]  Doctor Visits Discussed/Reviewed Doctor Visits Reviewed, Specialist  PCP/Specialist Visits Compliance with follow-up visit  [confirmed [place] Discussed/Reviewed Coping Strategies, Mental Health Reviewed  [No hallucinations noted per Kara Woods, daughter since started on [place] Dicussed/Reviewed Pharmacy Topics Reviewed  [on zyprexa]              SDOH assessments and interventions completed:  No     Care Coordination  Interventions:  Yes, provided   Follow up plan: Follow up call scheduled for 05/16/23    Encounter Outcome:  Pt. Visit Completed   [place] Latka L. Lavina Hamman, RN, BSN, [place] Coordinator Office number [phone]

## 2023-03-14 ENCOUNTER — Other Ambulatory Visit (HOSPITAL_COMMUNITY): Payer: Self-pay | Admitting: Family Medicine

## 2023-03-14 DIAGNOSIS — G8929 Other chronic pain: Secondary | ICD-10-CM

## 2023-03-14 DIAGNOSIS — M542 Cervicalgia: Secondary | ICD-10-CM | POA: Diagnosis not present

## 2023-03-14 DIAGNOSIS — M545 Low back pain, unspecified: Secondary | ICD-10-CM | POA: Diagnosis not present

## 2023-04-19 ENCOUNTER — Other Ambulatory Visit: Payer: Self-pay | Admitting: Cardiovascular Disease

## 2023-04-23 ENCOUNTER — Other Ambulatory Visit: Payer: Self-pay | Admitting: Hematology

## 2023-04-23 ENCOUNTER — Other Ambulatory Visit: Payer: Self-pay | Admitting: Cardiovascular Disease

## 2023-04-24 ENCOUNTER — Encounter (HOSPITAL_COMMUNITY): Payer: Self-pay | Admitting: Hematology

## 2023-04-24 ENCOUNTER — Other Ambulatory Visit: Payer: Self-pay | Admitting: *Deleted

## 2023-04-24 NOTE — Telephone Encounter (Signed)
Anastrozole refill approved.  Patient is tolerating and is to continue therapy.  

## 2023-04-25 ENCOUNTER — Other Ambulatory Visit: Payer: Self-pay

## 2023-04-25 MED ORDER — PALBOCICLIB 75 MG PO TABS: 75.0000 mg | ORAL_TABLET | Freq: Every day | ORAL | 6 refills | Status: DC

## 2023-04-25 NOTE — Telephone Encounter (Signed)
Chart reviewed. Ibrance refilled per last office note with Dr. Ellin Saba.

## 2023-05-07 ENCOUNTER — Encounter (HOSPITAL_COMMUNITY): Payer: Self-pay | Admitting: Hematology

## 2023-05-08 ENCOUNTER — Encounter (HOSPITAL_COMMUNITY)
Admission: RE | Admit: 2023-05-08 | Discharge: 2023-05-08 | Disposition: A | Discharge status: Home / Self Care | Payer: Medicare Other | Source: Ambulatory Visit | Attending: Hematology | Admitting: Hematology

## 2023-05-08 ENCOUNTER — Inpatient Hospital Stay: Payer: Medicare Other | Attending: Hematology

## 2023-05-08 DIAGNOSIS — C771 Secondary and unspecified malignant neoplasm of intrathoracic lymph nodes: Secondary | ICD-10-CM | POA: Insufficient documentation

## 2023-05-08 DIAGNOSIS — C7951 Secondary malignant neoplasm of bone: Secondary | ICD-10-CM | POA: Insufficient documentation

## 2023-05-08 DIAGNOSIS — Z170 Estrogen receptor positive status [ER+]: Secondary | ICD-10-CM | POA: Diagnosis not present

## 2023-05-08 DIAGNOSIS — C50912 Malignant neoplasm of unspecified site of left female breast: Secondary | ICD-10-CM | POA: Diagnosis not present

## 2023-05-08 DIAGNOSIS — F32A Depression, unspecified: Secondary | ICD-10-CM | POA: Insufficient documentation

## 2023-05-08 DIAGNOSIS — Z79811 Long term (current) use of aromatase inhibitors: Secondary | ICD-10-CM | POA: Insufficient documentation

## 2023-05-08 DIAGNOSIS — C3411 Malignant neoplasm of upper lobe, right bronchus or lung: Secondary | ICD-10-CM | POA: Diagnosis not present

## 2023-05-08 DIAGNOSIS — Z90710 Acquired absence of both cervix and uterus: Secondary | ICD-10-CM | POA: Insufficient documentation

## 2023-05-08 DIAGNOSIS — R197 Diarrhea, unspecified: Secondary | ICD-10-CM | POA: Insufficient documentation

## 2023-05-08 DIAGNOSIS — Z7982 Long term (current) use of aspirin: Secondary | ICD-10-CM | POA: Insufficient documentation

## 2023-05-08 DIAGNOSIS — Z79899 Other long term (current) drug therapy: Secondary | ICD-10-CM | POA: Insufficient documentation

## 2023-05-08 DIAGNOSIS — C50212 Malignant neoplasm of upper-inner quadrant of left female breast: Secondary | ICD-10-CM | POA: Diagnosis not present

## 2023-05-08 DIAGNOSIS — R978 Other abnormal tumor markers: Secondary | ICD-10-CM | POA: Insufficient documentation

## 2023-05-08 DIAGNOSIS — F419 Anxiety disorder, unspecified: Secondary | ICD-10-CM | POA: Insufficient documentation

## 2023-05-08 DIAGNOSIS — Z90722 Acquired absence of ovaries, bilateral: Secondary | ICD-10-CM | POA: Insufficient documentation

## 2023-05-08 DIAGNOSIS — C50919 Malignant neoplasm of unspecified site of unspecified female breast: Secondary | ICD-10-CM | POA: Diagnosis not present

## 2023-05-08 DIAGNOSIS — Z87891 Personal history of nicotine dependence: Secondary | ICD-10-CM | POA: Insufficient documentation

## 2023-05-08 LAB — COMPREHENSIVE METABOLIC PANEL
ALT: 10 U/L (ref 0–44)
AST: 15 U/L (ref 15–41)
Albumin: 3.9 g/dL (ref 3.5–5.0)
Alkaline Phosphatase: 50 U/L (ref 38–126)
Anion gap: 8 (ref 5–15)
BUN: 14 mg/dL (ref 8–23)
CO2: 26 mmol/L (ref 22–32)
Calcium: 9.2 mg/dL (ref 8.9–10.3)
Chloride: 104 mmol/L (ref 98–111)
Creatinine, Ser: 0.82 mg/dL (ref 0.44–1.00)
GFR, Estimated: >60 mL/min (ref >60)
Glucose, Bld: 95 mg/dL (ref 70–99)
Potassium: 3.9 mmol/L (ref 3.5–5.1)
Sodium: 138 mmol/L (ref 135–145)
Total Bilirubin: 0.8 mg/dL (ref 0.3–1.2)
Total Protein: 7.3 g/dL (ref 6.5–8.1)

## 2023-05-08 LAB — CBC WITH DIFFERENTIAL/PLATELET
Abs Immature Granulocytes: 0 10*3/uL (ref 0.00–0.07)
Basophils Absolute: 0.1 10*3/uL (ref 0.0–0.1)
Basophils Relative: 3 %
Eosinophils Absolute: 0 10*3/uL (ref 0.0–0.5)
Eosinophils Relative: 0 %
HCT: 36.9 % (ref 36.0–46.0)
Hemoglobin: 12.2 g/dL (ref 12.0–15.0)
Lymphocytes Relative: 37 %
Lymphs Abs: 1.8 10*3/uL (ref 0.7–4.0)
MCH: 34.8 pg — ABNORMAL HIGH (ref 26.0–34.0)
MCHC: 33.1 g/dL (ref 30.0–36.0)
MCV: 105.1 fL — ABNORMAL HIGH (ref 80.0–100.0)
Monocytes Absolute: 0.1 10*3/uL (ref 0.1–1.0)
Monocytes Relative: 2 %
Neutro Abs: 2.8 10*3/uL (ref 1.7–7.7)
Neutrophils Relative %: 58 %
Platelets: 214 10*3/uL (ref 150–400)
RBC: 3.51 MIL/uL — ABNORMAL LOW (ref 3.87–5.11)
RDW: 14.6 % (ref 11.5–15.5)
WBC: 4.9 10*3/uL (ref 4.0–10.5)
nRBC: 0 % (ref 0.0–0.2)

## 2023-05-08 MED ORDER — FLUDEOXYGLUCOSE F - 18 (FDG) INJECTION
9.2000 | Freq: Once | INTRAVENOUS | Status: AC | PRN
  Administered 2023-05-08: 7.95 via INTRAVENOUS

## 2023-05-09 LAB — CANCER ANTIGEN 15-3: CA 15-3: 28.6 U/mL — ABNORMAL HIGH (ref 0.0–25.0)

## 2023-05-09 LAB — CANCER ANTIGEN 27.29: CA 27.29: 51.8 U/mL — ABNORMAL HIGH (ref 0.0–38.6)

## 2023-05-13 NOTE — Progress Notes (Addendum)
[facility] [address], [place] [address]    Clinic Day:  05/14/2023  Referring physician: Benita Stabile, MD  Patient Care Team: Benita Stabile, MD as PCP - General (Internal Medicine) Lennette Bihari, MD as PCP - Cardiology (Cardiology) Jena Gauss Gerrit Friends, MD (Gastroenterology) Therese Sarah, RN as Oncology Nurse Navigator (Oncology) Clinton Gallant, RN as [facility] Care Management Clinton Gallant, RN as [facility] Care Management   ASSESSMENT & PLAN:   Assessment: 1.  T2N0 left breast infiltrating lobular carcinoma: -Status post lumpectomy in June 2014, 1.4 cm, grade 1, lymphovascular invasion positive, ER 100%, PR 26%, Ki-67 18%. -She underwent XRT.  She did not take adjuvant endocrine therapy. -Mammogram on 07/13/2020, BI-RADS Category 1. -CEA was 10.3, Ca1 2515.8 and CA 19-9 05. -PET scan on 08/07/2020 shows widespread hypermetabolic bone meta stasis with soft tissue components at T1 and sacrum.  Thoracic nodal metastasis. -MRI of the thoracic spine on 08/15/2020 shows pathological fracture of T1 with mild osseous retropulsion and possible left C8 nerve root encroachment within the foramen at C7-T1.  No cord deformity. -Right third rib biopsy on 08/22/2020 consistent with metastatic breast cancer, ER 100% positive, PR 40% positive, Ki-67 10%, HER-2 2+ and negative by FISH. -Ibrance 100 mg 3 weeks on 1 week off on anastrozole 1 mg daily started on 08/31/2020. -Ibrance held on 10/13/2020 secondary to severe tiredness. - PET scan on 02/19/2021 shows improvement in metastatic disease.   2.  Social/family history: -She quit smoking 20 years ago, smoked 2 packs/day for more than 20 years prior to quitting. -She lives at home by herself and is independent of all activities. -Daughter who has multiple myeloma at age 78.    Plan: 1.  Metastatic breast cancer to the bones: - She is tolerating Ibrance and anastrozole very well. - No  new onset pains. - Labs from 05/08/2023: Normal LFTs.  CBC grossly normal.  CA 15-3 is stable at 28.6.  However CA 27-29 increased to 51. - PET scan (05/08/2023): Mild right hilar metabolism 3.2, corresponding to 7 mm lymph node.  No evidence of hypermetabolic metastatic disease.  Quiescent osseous metastatic disease. - Recommend continuing Ibrance 75 mg 3 weeks on/1 week off and anastrozole daily.  I have reassured her multiple times during the visit that PET scan did not show blatant progression.  RTC 3 months with repeat labs.  Plan to repeat PET scan in 6 months.   2.  Bone metastasis: - Denosumab on hold due to dental extractions.  Will plan to restart at next visit.   3.  Depression/anxiety: - Continue Lexapro 20 mg daily and clorazepate as needed for anxiety. - Continue olanzapine 2.5 mg twice daily which was started for hallucinations.   4.  Diarrhea: - Continue Imodium as needed.  She is not requiring every day.    Orders Placed This Encounter  Procedures   CBC with Differential/Platelet    Standing Status:   Future    Standing Expiration Date:   05/13/2024    Order Specific Question:   Release to patient    Answer:   Immediate   Comprehensive metabolic panel    Standing Status:   Future    Standing Expiration Date:   05/13/2024    Order Specific Question:   Release to patient    Answer:   Immediate   Cancer antigen 15-3    Standing Status:   Future  Standing Expiration Date:   05/13/2024   Cancer antigen 27.29    Standing Status:   Future    Standing Expiration Date:   05/13/2024      I,Katie Daubenspeck,acting as a scribe for Doreatha Massed, MD.,have documented all relevant documentation on the behalf of Doreatha Massed, MD,as directed by  Doreatha Massed, MD while in the presence of Doreatha Massed, MD.   I, Doreatha Massed MD, have reviewed the above documentation for accuracy and completeness, and I agree with the above.   Doreatha Massed, MD   6/19/20244:54 PM  CHIEF COMPLAINT:   Diagnosis: left breast cancer    Cancer Staging  History of breast cancer Staging form: Breast, AJCC 7th Edition - Clinical stage from 04/20/2013: Stage IIA (T2, N0, cM0) - Unsigned - Pathologic: No stage assigned - Unsigned    Prior Therapy: 1. Left lumpectomy in June 2014  2.  XRT  Current Therapy:  Ibrance and anastrozole    HISTORY OF PRESENT ILLNESS:   Oncology History   No history exists.     INTERVAL HISTORY:   Kara Woods is a 89 y.o. female presenting to clinic today for follow up of left breast cancer. She was last seen by me on 02/05/23.  Since her last visit, she underwent restaging PET scan on 05/08/23.   Today, she states that she is doing well overall. Her appetite level is at 100%. Her energy level is at 10%.  PAST MEDICAL HISTORY:   Past Medical History: Past Medical History:  Diagnosis Date   Anxiety    Arthritis    Blind left eye    Breast cancer (HCC)    left    Colitis    Coronary artery disease    a. 10/2016 Staged PCI: RCA 50p, 52m (2.25x12 Resolute Onyx DES), LCX 50p, 91m (2.75x23 Xience Alpine DES). Residual LAD 50p/m, 54m/d.   Cystocele 10/11/2013   Depression    Diastolic dysfunction    a. 09/2016 Echo: EF 50%, diffuse hypokinesis, mild LVH, grade 1 diastolic dysfunction, mild mitral regurgitation, mildly dilated left atrium.   GERD (gastroesophageal reflux disease)    occasional Tums only   HOH (hard of hearing)    Pelvic relaxation 10/11/2013   Proctitis    colonsocopy 2007, Canasa suppositories   S/P endoscopy March 2009   mild erosive reflux esophagitis, Schatzki's ring, s/p dilation   Schatzki's ring    Shortness of breath    occ if anxiety   Wears dentures    upper   Wears glasses     Surgical History: Past Surgical History:  Procedure Laterality Date   ABDOMINAL HYSTERECTOMY     ANTERIOR AND POSTERIOR REPAIR N/A 05/17/2014   Procedure: ANTERIOR (CYSTOCELE) AND  POSTERIOR REPAIR (RECTOCELE);  Surgeon: Martina Sinner, MD;  Location: [facility];  Service: Urology;  Laterality: N/A;   APPENDECTOMY     BREAST LUMPECTOMY WITH NEEDLE LOCALIZATION AND AXILLARY SENTINEL LYMPH NODE BX Left 05/03/2013   Procedure: BREAST LUMPECTOMY WITH NEEDLE LOCALIZATION AND AXILLARY SENTINEL LYMPH NODE BX;  Surgeon: Mariella Saa, MD;  Location: [facility];  Service: General;  Laterality: Left;   BREAST SURGERY Left 05/19/13   CARDIAC CATHETERIZATION  1998   CARDIAC CATHETERIZATION N/A 11/04/2016   Procedure: Left Heart Cath and Coronary Angiography;  Surgeon: Lennette Bihari, MD;  Location: [facility]VASIVE CV [facility];  Service: Cardiovascular;  Laterality: N/A;   CARDIAC CATHETERIZATION N/A 11/05/2016   Procedure: Coronary Stent Intervention;  Surgeon: Clovis Pu  Tresa Endo, MD;  Location[facility] INVAS[facility] CV LAB;  Service: Cardiovascular;  Laterality: N/A;   CHOLECYSTECTOMY     COLONOSCOPY  05/06/2006   Diffuse inflammatory changes of the rectal mucosa, consistent  with proctitis.  Otherwise, normal colon to terminal ileum   CORONARY ANGIOPLASTY  11/04/2016   CORONARY STENT PLACEMENT     Drug-eluting coronary artery stent, non-bioabsorbable-polymer-coated   CYSTOSCOPY N/A 05/17/2014   Procedure: CYSTOSCOPY;  Surgeon: Martina Sinner, MD;  Location: [facility];  Service: Urology;  Laterality: N/A;   ESOPHAGOGASTRODUODENOSCOPY  02/09/2008   Schatzki ring status post dilation/Distal esophageal erosion consistent with mild erosive reflux  esophagitis, otherwise unremarkable esophagus, normal stomach, D1, D2.   EYE SURGERY     lt cataract-implant   EYE SURGERY     lt-lens repaced,l   LUMBAR DISC SURGERY  1993   RE-EXCISION OF BREAST LUMPECTOMY Left 05/19/2013   Procedure: RE-EXCISION OF BREAST LUMPECTOMY;  Surgeon: Mariella Saa, MD;  Location: [facility];  Service: General;  Laterality: Left;   RETINAL DETACHMENT SURGERY  1978   Left   VAGINAL HYSTERECTOMY  Bilateral 05/17/2014   Procedure: HYSTERECTOMY VAGINAL with Bilateral Salpingo-Oophorectomy; Bladder cystotomy repair;  Surgeon: Serita Kyle, MD;  Location: [facility];  Service: Gynecology;  Laterality: Bilateral;   VAGINAL PROLAPSE REPAIR N/A 05/17/2014   Procedure: VAGINAL VAULT PROLAPSE AND GRAFT;  Surgeon: Martina Sinner, MD;  Location: [facility];  Service: Urology;  Laterality: N/A;    Social History: Social History   Socioeconomic History   Marital status: Widowed    Spouse name: Not on file   Number of children: 6   Years of education: Not on file   Highest education level: Not on file  Occupational History   Occupation: Retired  Tobacco Use   Smoking status: Former    Packs/day: 1    Types: Cigarettes    Quit date: 04/28/1990    Years since quitting: 33.0   Smokeless tobacco: Never   Tobacco comments:    quit 28 yrs ago  Substance and Sexual Activity   Alcohol use: Yes    Alcohol/week: 1.0 standard drink of alcohol    Types: 1 Glasses of wine per week    Comment: occ   Drug use: No   Sexual activity: Not Currently    Birth control/protection: Post-menopausal  Other Topics Concern   Not on file  Social History Narrative    02/14/23 lives at home by herself and is independent of all activities.   all her children live about a mile away. Daughter are handling her finances and also helping with her bills.    -Daughter who has multiple myeloma at age 57   Social Determinants of Health   Financial Resource Strain: Low Risk  (10/07/2022)   Overall Financial Resource Strain (CARDIA)    Difficulty of Paying Living Expenses: Not hard at all  Food Insecurity: No Food Insecurity (10/07/2022)   Hunger Vital Sign    Worried About Running Out of Food in the Last Year: Never true    Ran Out of Food in the Last Year: Never true  Transportation Needs: No Transportation Needs (10/07/2022)   PRAPARE - Administrator, Civil Service (Medical): No    Lack of  Transportation (Non-Medical): No  Physical Activity: Insufficiently Active (09/27/2020)   Exercise Vital Sign    Days of Exercise per Week: 2 days    Minutes of Exercise per Session: 10 min  Stress: No  Stress Concern Present (01/09/2023)   [employer] of Occupational Health - Occupational Stress Questionnaire    Feeling of Stress : Only a little  Social Connections: Socially Isolated (09/27/2020)   Social Connection and Isolation Panel [NHANES]    Frequency of Communication with Friends and Family: More than three times a week    Frequency of Social Gatherings with Friends and Family: More than three times a week    Attends Religious Services: Never    Database administrator or Organizations: No    Attends Banker Meetings: Never    Marital Status: Widowed  Intimate Partner Violence: Not At Risk (01/09/2023)   Humiliation, Afraid, Rape, and Kick questionnaire    Fear of Current or Ex-Partner: No    Emotionally Abused: No    Physically Abused: No    Sexually Abused: No    Family History: Family History  Problem Relation Age of Onset   Cancer Brother        spinal   Heart attack Mother    Heart attack Father    Heart attack Son    Heart attack Son    Depression Son    Multiple myeloma Daughter    Anemia Daughter    Colon cancer Neg Hx     Current Medications:  Current Outpatient Medications:    acetaminophen (TYLENOL) 325 MG tablet, Take 2 tablets (650 mg total) by mouth every 6 (six) hours as needed for mild pain or headache (or temp > 37.5 C (99.5 F))., Disp: 40 tablet, Rfl: 0   anastrozole (ARIMIDEX) 1 MG tablet, TAKE 1 TABLET BY MOUTH EVERY DAY, Disp: 90 tablet, Rfl: 2   aspirin 81 MG EC tablet, Take 1 tablet (81 mg total) by mouth daily. Swallow whole., Disp: 60 tablet, Rfl: 2   carboxymethylcellulose (REFRESH PLUS) 0.5 % SOLN, Place 3-4 drops into both eyes 3 (three) times daily as needed (dry eyes). Preservative free, Disp: , Rfl:    escitalopram  (LEXAPRO) 20 MG tablet, Take 1 tablet (20 mg total) by mouth daily., Disp: 90 tablet, Rfl: 2   latanoprost (XALATAN) 0.005 % ophthalmic solution, Place 1 drop into both eyes at bedtime., Disp: , Rfl:    metoprolol tartrate (LOPRESSOR) 25 MG tablet, Take 1 tablet (25 mg total) by mouth 2 (two) times daily., Disp: 180 tablet, Rfl: 3   nitroGLYCERIN (NITROSTAT) 0.4 MG SL tablet, Place 1 tablet (0.4 mg total) under the tongue every 5 (five) minutes as needed., Disp: 25 tablet, Rfl: 3   OLANZapine (ZYPREXA) 2.5 MG tablet, Take 1 tablet (2.5 mg total) by mouth 2 (two) times daily. And an additional tab in the morning, Disp: 60 tablet, Rfl: 11   palbociclib (IBRANCE) 75 MG tablet, Take 1 tablet (75 mg total) by mouth daily. Take for 21 days on, 7 days off, repeat every 28 days., Disp: 21 tablet, Rfl: 6   Allergies: Allergies  Allergen Reactions   Contrast Media [Iodinated Contrast Media] Anaphylaxis    Adverse reaction; pt hyperventilating, c/o CP and SOB    Lipitor [Atorvastatin] Other (See Comments)    Muscle aches, cramps, fatigue, weight loss/poor appetite   Sulfamethoxazole Other (See Comments)    Reaction was years ago unknown   Sulfonamide Derivatives Other (See Comments)    Dizziness     REVIEW OF SYSTEMS:   Review of Systems  Constitutional:  Negative for chills, fatigue and fever.  HENT:   Negative for lump/mass, mouth sores, nosebleeds, sore throat and trouble  swallowing.   Eyes:  Negative for eye problems.  Respiratory:  Negative for cough and shortness of breath.   Cardiovascular:  Positive for leg swelling. Negative for chest pain and palpitations.  Gastrointestinal:  Negative for abdominal pain, constipation, diarrhea, nausea and vomiting.  Genitourinary:  Negative for bladder incontinence, difficulty urinating, dysuria, frequency, hematuria and nocturia.   Musculoskeletal:  Negative for arthralgias, back pain, flank pain, myalgias and neck pain.  Skin:  Negative for itching  and rash.  Neurological:  Positive for dizziness and numbness. Negative for headaches.  Hematological:  Does not bruise/bleed easily.  Psychiatric/Behavioral:  Negative for depression, sleep disturbance and suicidal ideas. The patient is not nervous/anxious.   All other systems reviewed and are negative.    VITALS:   Blood pressure 112/72, pulse 86, temperature 98.3 F (36.8 C), temperature source Oral, resp. rate 18, weight 156 lb 11.2 oz (71.1 kg), SpO2 95 %.  Wt Readings from Last 3 Encounters:  05/14/23 156 lb 11.2 oz (71.1 kg)  02/05/23 159 lb (72.1 kg)  01/08/23 157 lb (71.2 kg)    Body mass index is 26.9 kg/m.  Performance status (ECOG): 1 - Symptomatic but completely ambulatory  PHYSICAL EXAM:   Physical Exam Vitals and nursing note reviewed. Exam conducted with a chaperone present.  Constitutional:      Appearance: Normal appearance.  Cardiovascular:     Rate and Rhythm: Normal rate and regular rhythm.     Pulses: Normal pulses.     Heart sounds: Normal heart sounds.  Pulmonary:     Effort: Pulmonary effort is normal.     Breath sounds: Normal breath sounds.  Abdominal:     Palpations: Abdomen is soft. There is no hepatomegaly, splenomegaly or mass.     Tenderness: There is no abdominal tenderness.  Musculoskeletal:     Right lower leg: No edema.     Left lower leg: No edema.  Lymphadenopathy:     Cervical: No cervical adenopathy.     Right cervical: No superficial, deep or posterior cervical adenopathy.    Left cervical: No superficial, deep or posterior cervical adenopathy.     Upper Body:     Right upper body: No supraclavicular or axillary adenopathy.     Left upper body: No supraclavicular or axillary adenopathy.  Neurological:     General: No focal deficit present.     Mental Status: She is alert and oriented to person, place, and time.  Psychiatric:        Mood and Affect: Mood normal.        Behavior: Behavior normal.     LABS:      Latest  Ref Rng & Units 05/08/2023   11:25 AM 01/29/2023    1:25 PM 10/24/2022   10:12 AM  CBC  WBC 4.0 - 10.5 K/uL 4.9  4.0  3.2   Hemoglobin 12.0 - 15.0 g/dL 16.1  09.6  04.5   Hematocrit 36.0 - 46.0 % 36.9  34.9  35.6   Platelets 150 - 400 K/uL 214  127  266       Latest Ref Rng & Units 05/08/2023   11:25 AM 01/29/2023    1:25 PM 10/24/2022   10:12 AM  CMP  Glucose 70 - 99 mg/dL 95  409  93   BUN 8 - 23 mg/dL 14  13  9    Creatinine 0.44 - 1.00 mg/dL 8.11  9.14  7.82   Sodium 135 - 145 mmol/L 138  138  139   Potassium 3.5 - 5.1 mmol/L 3.9  3.7  4.4   Chloride 98 - 111 mmol/L 104  104  104   CO2 22 - 32 mmol/L 26  27  28    Calcium 8.9 - 10.3 mg/dL 9.2  8.9  8.5   Total Protein 6.5 - 8.1 g/dL 7.3  7.1  7.2   Total Bilirubin 0.3 - 1.2 mg/dL 0.8  0.7  0.5   Alkaline Phos 38 - 126 U/L 50  39  39   AST 15 - 41 U/L 15  17  21    ALT 0 - 44 U/L 10  10  12       No results found for: "CEA1", "CEA" / No results found for: "CEA1", "CEA" No results found for: "PSA1" Lab Results  Component Value Date   RUE454 6 10/09/2021   No results found for: "UJW119"  No results found for: "TOTALPROTELP", "ALBUMINELP", "A1GS", "A2GS", "BETS", "BETA2SER", "GAMS", "MSPIKE", "SPEI" No results found for: "TIBC", "FERRITIN", "IRONPCTSAT" No results found for: "LDH"   STUDIES:   NM PET Image Restag (PS) Skull Base To Thigh  Result Date: 05/14/2023 CLINICAL DATA:  Subsequent treatment strategy for left breast cancer. EXAM: NUCLEAR MEDICINE PET SKULL BASE TO THIGH TECHNIQUE: 8.0 mCi F-18 FDG was injected intravenously. Full-ring PET imaging was performed from the skull base to thigh after the radiotracer. CT data was obtained and used for attenuation correction and anatomic localization. Fasting blood glucose:  mg/dl COMPARISON:  14/78/2956. FINDINGS: Mediastinal blood pool activity: SUV max 2.8 Liver activity: SUV max NA NECK: No abnormal hypermetabolism. Incidental CT findings: None. CHEST: Mild right hilar  hypermetabolism, 3.2, corresponding to a 7 mm lymph node (3/86). No additional abnormal hypermetabolism. Incidental CT findings: Atherosclerotic calcification of the aorta and coronary arteries. Enlarged pulmonic trunk and heart. No pericardial or pleural effusion. Centrilobular emphysema. ABDOMEN/PELVIS: No abnormal hypermetabolism. Incidental CT findings: Liver, adrenal glands, kidneys, spleen, pancreas, stomach and bowel are grossly unremarkable. Cholecystectomy. SKELETON: No abnormal hypermetabolism. Incidental CT findings: Scattered lytic lesions in the visualized osseous structures. Again, no abnormal hypermetabolism. IMPRESSION: 1. No evidence of hypermetabolic metastatic disease. 2. Quiescent osseous metastatic disease. 3. Mildly hypermetabolic small right hilar lymph node, nonspecific. Recommend attention on follow-up. 4. Aortic atherosclerosis (ICD10-I70.0). Coronary artery calcification. 5. Enlarged pulmonic trunk, indicative of pulmonary arterial hypertension. 6.  Emphysema (ICD10-J43.9). Electronically Signed   By: Leanna Battles M.D.   On: 05/14/2023 15:11

## 2023-05-14 ENCOUNTER — Inpatient Hospital Stay (HOSPITAL_BASED_OUTPATIENT_CLINIC_OR_DEPARTMENT_OTHER): Payer: Medicare Other | Admitting: Hematology

## 2023-05-14 VITALS — BP 112/72 | HR 86 | Temp 98.3°F | Resp 18 | Wt 156.7 lb

## 2023-05-14 DIAGNOSIS — Z170 Estrogen receptor positive status [ER+]: Secondary | ICD-10-CM

## 2023-05-14 DIAGNOSIS — F419 Anxiety disorder, unspecified: Secondary | ICD-10-CM | POA: Diagnosis not present

## 2023-05-14 DIAGNOSIS — Z90710 Acquired absence of both cervix and uterus: Secondary | ICD-10-CM | POA: Diagnosis not present

## 2023-05-14 DIAGNOSIS — C7951 Secondary malignant neoplasm of bone: Secondary | ICD-10-CM | POA: Diagnosis not present

## 2023-05-14 DIAGNOSIS — F32A Depression, unspecified: Secondary | ICD-10-CM | POA: Diagnosis not present

## 2023-05-14 DIAGNOSIS — R197 Diarrhea, unspecified: Secondary | ICD-10-CM | POA: Diagnosis not present

## 2023-05-14 DIAGNOSIS — Z87891 Personal history of nicotine dependence: Secondary | ICD-10-CM | POA: Diagnosis not present

## 2023-05-14 DIAGNOSIS — R978 Other abnormal tumor markers: Secondary | ICD-10-CM | POA: Diagnosis not present

## 2023-05-14 DIAGNOSIS — Z7982 Long term (current) use of aspirin: Secondary | ICD-10-CM | POA: Diagnosis not present

## 2023-05-14 DIAGNOSIS — C50212 Malignant neoplasm of upper-inner quadrant of left female breast: Secondary | ICD-10-CM

## 2023-05-14 DIAGNOSIS — Z90722 Acquired absence of ovaries, bilateral: Secondary | ICD-10-CM | POA: Diagnosis not present

## 2023-05-14 DIAGNOSIS — Z79811 Long term (current) use of aromatase inhibitors: Secondary | ICD-10-CM | POA: Diagnosis not present

## 2023-05-14 DIAGNOSIS — Z79899 Other long term (current) drug therapy: Secondary | ICD-10-CM | POA: Diagnosis not present

## 2023-05-14 DIAGNOSIS — C50912 Malignant neoplasm of unspecified site of left female breast: Secondary | ICD-10-CM | POA: Diagnosis not present

## 2023-05-14 DIAGNOSIS — C771 Secondary and unspecified malignant neoplasm of intrathoracic lymph nodes: Secondary | ICD-10-CM | POA: Diagnosis not present

## 2023-05-14 NOTE — Patient Instructions (Addendum)
[facility]  Discharge Instructions  You were seen and examined today by Dr. Ellin Saba.  Dr. Ellin Saba discussed your most recent lab work and CT scan which revealed that everything looks good. You have one little 7 mm lymph node in the chest. Dr. Ellin Saba will just keep a watch on this.  Continue to be active as this will help with your energy levels. Drink 60 ounces of water everyday.  Continue taking Ibrance as prescribed.  Follow-up as scheduled in 3 months.    Thank you for choosing  [facility] to provide your oncology and hematology care.   To afford each patient quality time with our provider, please arrive at least 15 minutes before your scheduled appointment time. You may need to reschedule your appointment if you arrive late (10 or more minutes). Arriving late affects you and other patients whose appointments are after yours.  Also, if you miss three or more appointments without notifying the office, you may be dismissed from the clinic at the provider's discretion.    Again, thank you for choosing [facility].  Our hope is that these requests will decrease the amount of time that you wait before being seen by our physicians.   If you have a lab appointment with the Cancer Center - please note that after April 8th, all labs will be drawn in the cancer center.  You do not have to check in or register with the main entrance as you have in the past but will complete your check-in at the cancer center.            _____________________________________________________________  Should you have questions after your visit to [facility], please contact our office at [phone] and follow the prompts.  Our office hours are 8:00 a.m. to 4:30 p.m. Monday - Thursday and 8:00 a.m. to 2:30 p.m. Friday.  Please note that voicemails left after 4:00 p.m. may not be returned until the following business day.  We  are closed weekends and all major holidays.  You do have access to a nurse 24-7, just call the main number to the clinic [phone] and do not press any options, hold on the line and a nurse will answer the phone.    For prescription refill requests, have your pharmacy contact our office and allow 72 hours.    Masks are no longer required in the cancer centers. If you would like for your care team to wear a mask while they are taking care of you, please let them know. You may have one support person who is at least 87 years old accompany you for your appointments.

## 2023-05-16 ENCOUNTER — Ambulatory Visit: Payer: Self-pay | Admitting: *Deleted

## 2023-05-16 NOTE — Patient Outreach (Signed)
  Care Coordination   05/16/2023 Name: Kara Woods MRN: [id number] DOB: 01/30/1933   Care Coordination Outreach Attempts:  An unsuccessful telephone outreach was attempted today to offer the patient information about available care coordination services.  Follow Up Plan:  Additional outreach attempts will be made to offer the patient care coordination information and services.   Encounter Outcome:  No Answer   Care Coordination Interventions:  No, not indicated    Kara Woods L. Noelle Penner, RN, BSN, CCM [facility] Care Management Community Coordinator Office number [phone]

## 2023-06-01 ENCOUNTER — Other Ambulatory Visit: Payer: Self-pay | Admitting: Neurology

## 2023-06-25 ENCOUNTER — Other Ambulatory Visit: Payer: Self-pay | Admitting: Hematology

## 2023-06-25 DIAGNOSIS — C50919 Malignant neoplasm of unspecified site of unspecified female breast: Secondary | ICD-10-CM

## 2023-06-25 DIAGNOSIS — Z170 Estrogen receptor positive status [ER+]: Secondary | ICD-10-CM

## 2023-08-18 ENCOUNTER — Other Ambulatory Visit: Payer: Self-pay | Admitting: Neurology

## 2023-08-18 DIAGNOSIS — R300 Dysuria: Secondary | ICD-10-CM | POA: Diagnosis not present

## 2023-08-18 DIAGNOSIS — M7989 Other specified soft tissue disorders: Secondary | ICD-10-CM | POA: Diagnosis not present

## 2023-08-18 DIAGNOSIS — G629 Polyneuropathy, unspecified: Secondary | ICD-10-CM | POA: Diagnosis not present

## 2023-08-18 DIAGNOSIS — N390 Urinary tract infection, site not specified: Secondary | ICD-10-CM | POA: Diagnosis not present

## 2023-08-18 NOTE — Telephone Encounter (Signed)
Requested Prescriptions   Pending Prescriptions Disp Refills   clorazepate (TRANXENE) 3.75 MG tablet [Pharmacy Med Name: CLORAZEPATE 3.75 MG TABLET] 60 tablet     Sig: TAKE 1 TABLET (3.75 MG TOTAL) BY MOUTH 2 (TWO) TIMES DAILY AS NEEDED FOR ANXIETY.   Last seen 01/08/23 Next appt 01/12/24  I am not able to check the registry for this

## 2023-08-19 ENCOUNTER — Inpatient Hospital Stay: Payer: Medicare Other | Attending: Hematology

## 2023-08-19 DIAGNOSIS — C50212 Malignant neoplasm of upper-inner quadrant of left female breast: Secondary | ICD-10-CM | POA: Diagnosis not present

## 2023-08-19 DIAGNOSIS — R978 Other abnormal tumor markers: Secondary | ICD-10-CM

## 2023-08-19 DIAGNOSIS — Z170 Estrogen receptor positive status [ER+]: Secondary | ICD-10-CM | POA: Diagnosis not present

## 2023-08-19 LAB — COMPREHENSIVE METABOLIC PANEL
ALT: 10 U/L (ref 0–44)
AST: 18 U/L (ref 15–41)
Albumin: 3.9 g/dL (ref 3.5–5.0)
Alkaline Phosphatase: 53 U/L (ref 38–126)
Anion gap: 10 (ref 5–15)
BUN: 13 mg/dL (ref 8–23)
CO2: 25 mmol/L (ref 22–32)
Calcium: 9 mg/dL (ref 8.9–10.3)
Chloride: 102 mmol/L (ref 98–111)
Creatinine, Ser: 1.14 mg/dL — ABNORMAL HIGH (ref 0.44–1.00)
GFR, Estimated: 46 mL/min — ABNORMAL LOW (ref >60)
Glucose, Bld: 93 mg/dL (ref 70–99)
Potassium: 3.9 mmol/L (ref 3.5–5.1)
Sodium: 137 mmol/L (ref 135–145)
Total Bilirubin: 0.5 mg/dL (ref 0.3–1.2)
Total Protein: 7.1 g/dL (ref 6.5–8.1)

## 2023-08-19 LAB — CBC WITH DIFFERENTIAL/PLATELET
Abs Immature Granulocytes: 0.01 10*3/uL (ref 0.00–0.07)
Basophils Absolute: 0.1 10*3/uL (ref 0.0–0.1)
Basophils Relative: 1 %
Eosinophils Absolute: 0.1 10*3/uL (ref 0.0–0.5)
Eosinophils Relative: 2 %
HCT: 36.5 % (ref 36.0–46.0)
Hemoglobin: 12.1 g/dL (ref 12.0–15.0)
Immature Granulocytes: 0 %
Lymphocytes Relative: 51 %
Lymphs Abs: 2.1 10*3/uL (ref 0.7–4.0)
MCH: 35.7 pg — ABNORMAL HIGH (ref 26.0–34.0)
MCHC: 33.2 g/dL (ref 30.0–36.0)
MCV: 107.7 fL — ABNORMAL HIGH (ref 80.0–100.0)
Monocytes Absolute: 0.4 10*3/uL (ref 0.1–1.0)
Monocytes Relative: 10 %
Neutro Abs: 1.5 10*3/uL — ABNORMAL LOW (ref 1.7–7.7)
Neutrophils Relative %: 36 %
Platelets: 189 10*3/uL (ref 150–400)
RBC: 3.39 MIL/uL — ABNORMAL LOW (ref 3.87–5.11)
RDW: 14.3 % (ref 11.5–15.5)
WBC: 4.2 10*3/uL (ref 4.0–10.5)
nRBC: 0 % (ref 0.0–0.2)

## 2023-08-20 LAB — CANCER ANTIGEN 27.29: CA 27.29: 44.5 U/mL — ABNORMAL HIGH (ref 0.0–38.6)

## 2023-08-20 LAB — CANCER ANTIGEN 15-3: CA 15-3: 30.3 U/mL — ABNORMAL HIGH (ref 0.0–25.0)

## 2023-08-26 ENCOUNTER — Inpatient Hospital Stay: Payer: Medicare Other | Attending: Hematology | Admitting: Hematology

## 2023-08-26 ENCOUNTER — Inpatient Hospital Stay: Payer: Medicare Other

## 2023-08-26 VITALS — BP 101/80 | HR 70 | Temp 97.5°F | Resp 16 | Wt 156.6 lb

## 2023-08-26 DIAGNOSIS — F419 Anxiety disorder, unspecified: Secondary | ICD-10-CM | POA: Insufficient documentation

## 2023-08-26 DIAGNOSIS — Z79899 Other long term (current) drug therapy: Secondary | ICD-10-CM | POA: Insufficient documentation

## 2023-08-26 DIAGNOSIS — C50212 Malignant neoplasm of upper-inner quadrant of left female breast: Secondary | ICD-10-CM | POA: Insufficient documentation

## 2023-08-26 DIAGNOSIS — Z170 Estrogen receptor positive status [ER+]: Secondary | ICD-10-CM | POA: Diagnosis not present

## 2023-08-26 DIAGNOSIS — R443 Hallucinations, unspecified: Secondary | ICD-10-CM | POA: Insufficient documentation

## 2023-08-26 DIAGNOSIS — Z87891 Personal history of nicotine dependence: Secondary | ICD-10-CM | POA: Insufficient documentation

## 2023-08-26 DIAGNOSIS — F32A Depression, unspecified: Secondary | ICD-10-CM | POA: Insufficient documentation

## 2023-08-26 DIAGNOSIS — Z79811 Long term (current) use of aromatase inhibitors: Secondary | ICD-10-CM | POA: Insufficient documentation

## 2023-08-26 DIAGNOSIS — Z853 Personal history of malignant neoplasm of breast: Secondary | ICD-10-CM

## 2023-08-26 DIAGNOSIS — R7989 Other specified abnormal findings of blood chemistry: Secondary | ICD-10-CM | POA: Insufficient documentation

## 2023-08-26 DIAGNOSIS — R197 Diarrhea, unspecified: Secondary | ICD-10-CM | POA: Diagnosis not present

## 2023-08-26 DIAGNOSIS — R978 Other abnormal tumor markers: Secondary | ICD-10-CM | POA: Diagnosis not present

## 2023-08-26 DIAGNOSIS — C7951 Secondary malignant neoplasm of bone: Secondary | ICD-10-CM | POA: Insufficient documentation

## 2023-08-26 MED ORDER — DENOSUMAB 120 MG/1.7ML ~~LOC~~ SOLN
120.0000 mg | Freq: Once | SUBCUTANEOUS | Status: AC
  Administered 2023-08-26: 120 mg via SUBCUTANEOUS
  Filled 2023-08-26: qty 1.7

## 2023-08-26 NOTE — Progress Notes (Signed)
Xgeva given per order in abdominal tissue without incident.  Tolerated well.  Discharged ambulatory in stable condition.

## 2023-08-26 NOTE — Progress Notes (Signed)
Patient is taking Anastrozole as prescribed.  She has not missed any doses and reports no side effects at this time.   

## 2023-08-26 NOTE — Progress Notes (Signed)
[facility] [address], [place] [address]    Clinic Day:  08/26/2023  Referring physician: Benita Stabile, MD  Patient Care Team: Kara Stabile, MD as PCP - General (Internal Medicine) Kara Bihari, MD as PCP - Cardiology (Cardiology) Kara Gauss Gerrit Friends, MD (Gastroenterology) Kara Sarah, RN as Oncology Nurse Navigator (Oncology) Kara Gallant, RN as [facility] Care Management Kara Gallant, RN as [facility] Care Management   ASSESSMENT & PLAN:   Assessment: 1.  T2N0M1 left breast infiltrating lobular carcinoma: -Status post lumpectomy in June 2014, 1.4 cm, grade 1, lymphovascular invasion positive, ER 100%, PR 26%, Ki-67 18%. -She underwent XRT.  She did not take adjuvant endocrine therapy. -Mammogram on 07/13/2020, BI-RADS Category 1. -CEA was 10.3, Ca1 2515.8 and CA 19-9 05. -PET scan on 08/07/2020 shows widespread hypermetabolic bone meta stasis with soft tissue components at T1 and sacrum.  Thoracic nodal metastasis. -MRI of the thoracic spine on 08/15/2020 shows pathological fracture of T1 with mild osseous retropulsion and possible left C8 nerve root encroachment within the foramen at C7-T1.  No cord deformity. -Right third rib biopsy on 08/22/2020 consistent with metastatic breast cancer, ER 100% positive, PR 40% positive, Ki-67 10%, HER-2 2+ and negative by FISH. -Ibrance 100 mg 3 weeks on 1 week off on anastrozole 1 mg daily started on 08/31/2020. -Ibrance held on 10/13/2020 secondary to severe tiredness. - PET scan on 02/19/2021 shows improvement in metastatic disease.   2.  Social/family history: -She quit smoking 20 years ago, smoked 2 packs/day for more than 20 years prior to quitting. -She lives at home by herself and is independent of all activities. -Daughter who has multiple myeloma at age 79.    Plan: 1.  Metastatic breast cancer to the bones, ER/PR positive, HER2 negative: - PET scan (05/25/2023):  Mild right hilar bolus of 3.2, corresponding to 7 mm lymph node.  No evidence of hypermetabolic metastatic disease.  Quiescent bone metastatic disease. - She is tolerating Ibrance reasonably well.  She had UTI last week for which she had to take antibiotic.  No other infections reported. - She reports new onset tingling in the bottom of the feet most of the time.  She has seen her PMD for it. - I have reviewed labs from 08/19/2023: Normal LFTs.  Creatinine is mildly elevated at 1.14.  CBC grossly normal with stable macrocytosis.  CA 15-3 has increased to 30.3 from 28.6.  However CA 27-29 decreased to 44 from 51. - Recommend continuing Ibrance 75 mg 3 weeks on/1 week off.  Continue anastrozole daily. - Recommend follow-up in 3 months with repeat tumor markers are PET scan.   2.  Bone metastasis: - She has completed dental work.  Denosumab was on hold because of it. - Calcium today is 9.0.  We will restart her back on monthly denosumab.   3.  Depression/anxiety: - Continue Lexapro 20 mg daily and clorazepate as needed for anxiety. - Continue olanzapine 2.5 mg twice daily, started for hallucinations.   4.  Diarrhea: - Continue Imodium as needed.  She is not requiring daily.    Orders Placed This Encounter  Procedures   NM PET Image Restag (PS) Skull Base To Thigh    Standing Status:   Future    Standing Expiration Date:   08/25/2024    Order Specific Question:   If indicated for the ordered procedure, I authorize the administration of a radiopharmaceutical per  Radiology protocol    Answer:   Yes    Order Specific Question:   Preferred imaging location?    Answer:   [facility]    Order Specific Question:   Release to patient    Answer:   Immediate   Cancer antigen 15-3    Standing Status:   Future    Standing Expiration Date:   08/25/2024   Cancer antigen 27.29    Standing Status:   Future    Standing Expiration Date:   08/25/2024   CBC with Differential/Platelet    Standing Status:    Future    Standing Expiration Date:   08/25/2024    Order Specific Question:   Release to patient    Answer:   Immediate   Comprehensive metabolic panel    Standing Status:   Future    Standing Expiration Date:   08/25/2024    Order Specific Question:   Release to patient    Answer:   Immediate   Magnesium    Standing Status:   Future    Standing Expiration Date:   08/25/2024    Order Specific Question:   Release to patient    Answer:   Immediate      I,Kara Woods,acting as a scribe for Kara Massed, MD.,have documented all relevant documentation on the behalf of Kara Massed, MD,as directed by  Kara Massed, MD while in the presence of Kara Massed, MD.  I, Kara Massed MD, have reviewed the above documentation for accuracy and completeness, and I agree with the above. Not been   Kara Massed, MD   10/1/20243:18 PM  CHIEF COMPLAINT:   Diagnosis: left breast cancer    Cancer Staging  History of breast cancer Staging form: Breast, AJCC 7th Edition - Clinical stage from 04/20/2013: Stage IIA (T2, N0, cM0) - Unsigned - Pathologic: No stage assigned - Unsigned    Prior Therapy: 1. Left lumpectomy in June 2014  2.  XRT  Current Therapy:  Ibrance and anastrozole    HISTORY OF PRESENT ILLNESS:   Oncology History   No history exists.     INTERVAL HISTORY:   Kara Woods is a 89 y.o. female presenting to clinic today for follow up of left breast cancer. She was last seen by me on 05/14/23.  Today, she states that she is doing well overall. Her appetite level is at 100%. Her energy level is at 50%. She is accompanied by her daughter.   Her daughter denies any changes in medications. She has been on an antibiotic last week for a UTI, but denies any other infections. She saw Kara Woods last week and complained of an almost constant tingling in the soles of the feet. She has access to a cane, but does not wish to use it. She has not had  any dental surgeries in the last 3 months and does not have any planned in the future.   PAST MEDICAL HISTORY:   Past Medical History: Past Medical History:  Diagnosis Date   Anxiety    Arthritis    Blind left eye    Breast cancer (HCC)    left    Colitis    Coronary artery disease    a. 10/2016 Staged PCI: RCA 50p, 44m (2.25x12 Resolute Onyx DES), LCX 50p, 27m (2.75x23 Xience Alpine DES). Residual LAD 50p/m, 3m/d.   Cystocele 10/11/2013   Depression    Diastolic dysfunction    a. 09/2016 Echo: EF 50%, diffuse hypokinesis, mild LVH,  grade 1 diastolic dysfunction, mild mitral regurgitation, mildly dilated left atrium.   GERD (gastroesophageal reflux disease)    occasional Tums only   HOH (hard of hearing)    Pelvic relaxation 10/11/2013   Proctitis    colonsocopy 2007, Canasa suppositories   S/P endoscopy March 2009   mild erosive reflux esophagitis, Schatzki's ring, s/p dilation   Schatzki's ring    Shortness of breath    occ if anxiety   Wears dentures    upper   Wears glasses     Surgical History: Past Surgical History:  Procedure Laterality Date   ABDOMINAL HYSTERECTOMY     ANTERIOR AND POSTERIOR REPAIR N/A 05/17/2014   Procedure: ANTERIOR (CYSTOCELE) AND POSTERIOR REPAIR (RECTOCELE);  Surgeon: Martina Sinner, MD;  Location: [facility];  Service: Urology;  Laterality: N/A;   APPENDECTOMY     BREAST LUMPECTOMY WITH NEEDLE LOCALIZATION AND AXILLARY SENTINEL LYMPH NODE BX Left 05/03/2013   Procedure: BREAST LUMPECTOMY WITH NEEDLE LOCALIZATION AND AXILLARY SENTINEL LYMPH NODE BX;  Surgeon: Mariella Saa, MD;  Location: [facility];  Service: General;  Laterality: Left;   BREAST SURGERY Left 05/19/13   CARDIAC CATHETERIZATION  1998   CARDIAC CATHETERIZATION N/A 11/04/2016   Procedure: Left Heart Cath and Coronary Angiography;  Surgeon: Kara Bihari, MD;  Location: [facility];  Service: Cardiovascular;  Laterality: N/A;   CARDIAC CATHETERIZATION  N/A 11/05/2016   Procedure: Coronary Stent Intervention;  Surgeon: Kara Bihari, MD;  Location: [facility];  Service: Cardiovascular;  Laterality: N/A;   CHOLECYSTECTOMY     COLONOSCOPY  05/06/2006   Diffuse inflammatory changes of the rectal mucosa, consistent  with proctitis.  Otherwise, normal colon to terminal ileum   CORONARY ANGIOPLASTY  11/04/2016   CORONARY STENT PLACEMENT     Drug-eluting coronary artery stent, non-bioabsorbable-polymer-coated   CYSTOSCOPY N/A 05/17/2014   Procedure: CYSTOSCOPY;  Surgeon: Martina Sinner, MD;  Location: [facility];  Service: Urology;  Laterality: N/A;   ESOPHAGOGASTRODUODENOSCOPY  02/09/2008   Schatzki ring status post dilation/Distal esophageal erosion consistent with mild erosive reflux  esophagitis, otherwise unremarkable esophagus, normal stomach, D1, D2.   EYE SURGERY     lt cataract-implant   EYE SURGERY     lt-lens repaced,l   LUMBAR DISC SURGERY  1993   RE-EXCISION OF BREAST LUMPECTOMY Left 05/19/2013   Procedure: RE-EXCISION OF BREAST LUMPECTOMY;  Surgeon: Mariella Saa, MD;  Location:  SURGERY CENTER;  Service: General;  Laterality: Left;   RETINAL DETACHMENT SURGERY  1978   Left   VAGINAL HYSTERECTOMY Bilateral 05/17/2014   Procedure: HYSTERECTOMY VAGINAL with Bilateral Salpingo-Oophorectomy; Bladder cystotomy repair;  Surgeon: Serita Kyle, MD;  Location: [facility];  Service: Gynecology;  Laterality: Bilateral;   VAGINAL PROLAPSE REPAIR N/A 05/17/2014   Procedure: VAGINAL VAULT PROLAPSE AND GRAFT;  Surgeon: Martina Sinner, MD;  Location: [facility];  Service: Urology;  Laterality: N/A;    Social History: Social History   Socioeconomic History   Marital status: Widowed    Spouse name: Not on file   Number of children: 6   Years of education: Not on file   Highest education level: Not on file  Occupational History   Occupation: Retired  Tobacco Use   Smoking status: Former    Current packs/day: 0.00     Types: Cigarettes    Quit date: 04/28/1990    Years since quitting: 33.3   Smokeless tobacco: Never   Tobacco  comments:    quit 28 yrs ago  Substance and Sexual Activity   Alcohol use: Yes    Alcohol/week: 1.0 standard drink of alcohol    Types: 1 Glasses of wine per week    Comment: occ   Drug use: No   Sexual activity: Not Currently    Birth control/protection: Post-menopausal  Other Topics Concern   Not on file  Social History Narrative    02/14/23 lives at home by herself and is independent of all activities.   all her children live about a mile away. Daughter are handling her finances and also helping with her bills.    -Daughter who has multiple myeloma at age 87   Social Determinants of Health   Financial Resource Strain: Low Risk  (10/07/2022)   Overall Financial Resource Strain (CARDIA)    Difficulty of Paying Living Expenses: Not hard at all  Food Insecurity: No Food Insecurity (10/07/2022)   Hunger Vital Sign    Worried About Running Out of Food in the Last Year: Never true    Ran Out of Food in the Last Year: Never true  Transportation Needs: No Transportation Needs (10/07/2022)   PRAPARE - Administrator, Civil Service (Medical): No    Lack of Transportation (Non-Medical): No  Physical Activity: Insufficiently Active (09/27/2020)   Exercise Vital Sign    Days of Exercise per Week: 2 days    Minutes of Exercise per Session: 10 min  Stress: No Stress Concern Present (01/09/2023)   [employer] of Occupational Health - Occupational Stress Questionnaire    Feeling of Stress : Only a little  Social Connections: Socially Isolated (09/27/2020)   Social Connection and Isolation Panel [NHANES]    Frequency of Communication with Woods and Family: More than three times a week    Frequency of Social Gatherings with Woods and Family: More than three times a week    Attends Religious Services: Never    Database administrator or Organizations: No     Attends Banker Meetings: Never    Marital Status: Widowed  Intimate Partner Violence: Not At Risk (01/09/2023)   Humiliation, Afraid, Rape, and Kick questionnaire    Fear of Current or Ex-Partner: No    Emotionally Abused: No    Physically Abused: No    Sexually Abused: No    Family History: Family History  Problem Relation Age of Onset   Cancer Brother        spinal   Heart attack Mother    Heart attack Father    Heart attack Son    Heart attack Son    Depression Son    Multiple myeloma Daughter    Anemia Daughter    Colon cancer Neg Hx     Current Medications:  Current Outpatient Medications:    acetaminophen (TYLENOL) 325 MG tablet, Take 2 tablets (650 mg total) by mouth every 6 (six) hours as needed for mild pain or headache (or temp > 37.5 C (99.5 F))., Disp: 40 tablet, Rfl: 0   anastrozole (ARIMIDEX) 1 MG tablet, TAKE 1 TABLET BY MOUTH EVERY DAY, Disp: 90 tablet, Rfl: 2   aspirin 81 MG EC tablet, Take 1 tablet (81 mg total) by mouth daily. Swallow whole., Disp: 60 tablet, Rfl: 2   carboxymethylcellulose (REFRESH PLUS) 0.5 % SOLN, Place 3-4 drops into both eyes 3 (three) times daily as needed (dry eyes). Preservative free, Disp: , Rfl:    clorazepate (TRANXENE) 3.75 MG tablet,  TAKE 1 TABLET (3.75 MG TOTAL) BY MOUTH 2 (TWO) TIMES DAILY AS NEEDED FOR ANXIETY., Disp: 60 tablet, Rfl: 0   escitalopram (LEXAPRO) 20 MG tablet, TAKE 1 TABLET BY MOUTH EVERY DAY, Disp: 90 tablet, Rfl: 2   latanoprost (XALATAN) 0.005 % ophthalmic solution, Place 1 drop into both eyes at bedtime., Disp: , Rfl:    metoprolol tartrate (LOPRESSOR) 25 MG tablet, Take 1 tablet (25 mg total) by mouth 2 (two) times daily., Disp: 180 tablet, Rfl: 3   OLANZapine (ZYPREXA) 2.5 MG tablet, TAKE 1 TABLET (2.5 MG TOTAL) BY MOUTH 2 (TWO) TIMES DAILY. AND AN ADDITIONAL TAB IN THE MORNING, Disp: 180 tablet, Rfl: 4   palbociclib (IBRANCE) 75 MG tablet, Take 1 tablet (75 mg total) by mouth daily. Take for 21  days on, 7 days off, repeat every 28 days., Disp: 21 tablet, Rfl: 6   nitroGLYCERIN (NITROSTAT) 0.4 MG SL tablet, Place 1 tablet (0.4 mg total) under the tongue every 5 (five) minutes as needed. (Patient not taking: Reported on 08/26/2023), Disp: 25 tablet, Rfl: 3   Allergies: Allergies  Allergen Reactions   Contrast Media [Iodinated Contrast Media] Anaphylaxis    Adverse reaction; pt hyperventilating, c/o CP and SOB    Lipitor [Atorvastatin] Other (See Comments)    Muscle aches, cramps, fatigue, weight loss/poor appetite   Sulfamethoxazole Other (See Comments)    Reaction was years ago unknown   Sulfonamide Derivatives Other (See Comments)    Dizziness     REVIEW OF SYSTEMS:   Review of Systems  Constitutional:  Negative for chills, fatigue and fever.  HENT:   Positive for trouble swallowing (solids). Negative for lump/mass, mouth sores, nosebleeds and sore throat.   Eyes:  Negative for eye problems.  Respiratory:  Negative for cough and shortness of breath.   Cardiovascular:  Positive for palpitations. Negative for chest pain and leg swelling.  Gastrointestinal:  Negative for abdominal pain, constipation, diarrhea, nausea and vomiting.  Genitourinary:  Negative for bladder incontinence, difficulty urinating, dysuria, frequency, hematuria and nocturia.   Musculoskeletal:  Negative for arthralgias, back pain, flank pain, myalgias and neck pain.  Skin:  Negative for itching and rash.  Neurological:  Positive for dizziness and numbness (in feet). Negative for headaches.  Hematological:  Does not bruise/bleed easily.  Psychiatric/Behavioral:  Negative for depression, sleep disturbance and suicidal ideas. The patient is not nervous/anxious.   All other systems reviewed and are negative.    VITALS:   Blood pressure 101/80, pulse 70, temperature (!) 97.5 F (36.4 C), temperature source Oral, resp. rate 16, weight 156 lb 9.6 oz (71 kg), SpO2 96%.  Wt Readings from Last 3 Encounters:   08/26/23 156 lb 9.6 oz (71 kg)  05/14/23 156 lb 11.2 oz (71.1 kg)  02/05/23 159 lb (72.1 kg)    Body mass index is 26.88 kg/m.  Performance status (ECOG): 1 - Symptomatic but completely ambulatory  PHYSICAL EXAM:   Physical Exam Vitals and nursing note reviewed. Exam conducted with a chaperone present.  Constitutional:      Appearance: Normal appearance.  Cardiovascular:     Rate and Rhythm: Normal rate and regular rhythm.     Pulses: Normal pulses.     Heart sounds: Normal heart sounds.  Pulmonary:     Effort: Pulmonary effort is normal.     Breath sounds: Normal breath sounds.  Abdominal:     Palpations: Abdomen is soft. There is no hepatomegaly, splenomegaly or mass.     Tenderness: There  is no abdominal tenderness.  Musculoskeletal:     Right lower leg: No edema.     Left lower leg: No edema.  Lymphadenopathy:     Cervical: No cervical adenopathy.     Right cervical: No superficial, deep or posterior cervical adenopathy.    Left cervical: No superficial, deep or posterior cervical adenopathy.     Upper Body:     Right upper body: No supraclavicular or axillary adenopathy.     Left upper body: No supraclavicular or axillary adenopathy.  Neurological:     General: No focal deficit present.     Mental Status: She is alert and oriented to person, place, and time.  Psychiatric:        Mood and Affect: Mood normal.        Behavior: Behavior normal.     LABS:      Latest Ref Rng & Units 08/19/2023    1:02 PM 05/08/2023   11:25 AM 01/29/2023    1:25 PM  CBC  WBC 4.0 - 10.5 K/uL 4.2  4.9  4.0   Hemoglobin 12.0 - 15.0 g/dL 16.1  09.6  04.5   Hematocrit 36.0 - 46.0 % 36.5  36.9  34.9   Platelets 150 - 400 K/uL 189  214  127       Latest Ref Rng & Units 08/19/2023    1:02 PM 05/08/2023   11:25 AM 01/29/2023    1:25 PM  CMP  Glucose 70 - 99 mg/dL 93  95  409   BUN 8 - 23 mg/dL 13  14  13    Creatinine 0.44 - 1.00 mg/dL 8.11  9.14  7.82   Sodium 135 - 145 mmol/L 137   138  138   Potassium 3.5 - 5.1 mmol/L 3.9  3.9  3.7   Chloride 98 - 111 mmol/L 102  104  104   CO2 22 - 32 mmol/L 25  26  27    Calcium 8.9 - 10.3 mg/dL 9.0  9.2  8.9   Total Protein 6.5 - 8.1 g/dL 7.1  7.3  7.1   Total Bilirubin 0.3 - 1.2 mg/dL 0.5  0.8  0.7   Alkaline Phos 38 - 126 U/L 53  50  39   AST 15 - 41 U/L 18  15  17    ALT 0 - 44 U/L 10  10  10       No results found for: "CEA1", "CEA" / No results found for: "CEA1", "CEA" No results found for: "PSA1" Lab Results  Component Value Date   NFA213 6 10/09/2021   No results found for: "YQM578"  No results found for: "TOTALPROTELP", "ALBUMINELP", "A1GS", "A2GS", "BETS", "BETA2SER", "GAMS", "MSPIKE", "SPEI" No results found for: "TIBC", "FERRITIN", "IRONPCTSAT" No results found for: "LDH"   STUDIES:   No results found.

## 2023-08-26 NOTE — Patient Instructions (Signed)
[facility]  Discharge Instructions  You were seen and examined today by Dr. Katragadda.  Dr. Katragadda discussed your most recent lab work which revealed that everything looks good and stable.  Follow-up as scheduled in 3 months.    Thank you for choosing  [facility] to provide your oncology and hematology care.   To afford each patient quality time with our provider, please arrive at least 15 minutes before your scheduled appointment time. You may need to reschedule your appointment if you arrive late (10 or more minutes). Arriving late affects you and other patients whose appointments are after yours.  Also, if you miss three or more appointments without notifying the office, you may be dismissed from the clinic at the provider's discretion.    Again, thank you for choosing [facility].  Our hope is that these requests will decrease the amount of time that you wait before being seen by our physicians.   If you have a lab appointment with the Cancer Center - please note that after April 8th, all labs will be drawn in the cancer center.  You do not have to check in or register with the main entrance as you have in the past but will complete your check-in at the cancer center.            _____________________________________________________________  Should you have questions after your visit to [facility], please contact our office at [phone] and follow the prompts.  Our office hours are 8:00 a.m. to 4:30 p.m. Monday - Thursday and 8:00 a.m. to 2:30 p.m. Friday.  Please note that voicemails left after 4:00 p.m. may not be returned until the following business day.  We are closed weekends and all major holidays.  You do have access to a nurse 24-7, just call the main number to the clinic [phone] and do not press any options, hold on the line and a nurse will answer the phone.    For prescription  refill requests, have your pharmacy contact our office and allow 72 hours.    Masks are no longer required in the cancer centers. If you would like for your care team to wear a mask while they are taking care of you, please let them know. You may have one support person who is at least 87 years old accompany you for your appointments.  

## 2023-09-09 ENCOUNTER — Telehealth: Payer: Self-pay

## 2023-09-09 ENCOUNTER — Other Ambulatory Visit (HOSPITAL_COMMUNITY): Payer: Self-pay

## 2023-09-09 NOTE — Telephone Encounter (Signed)
*  [facility]  Pharmacy Patient Advocate Encounter  Received notification from [facility] that Prior Authorization for Clorazepate Dipotassium 3.75MG  tablets  has been APPROVED from 09/09/2023 to 10/09/2023. Ran test claim, Copay is $pharmacy must input DUR codes. This test claim was processed through [facility]- copay amounts may vary at other pharmacies due to pharmacy/plan contracts, or as the patient moves through the different stages of their insurance plan.   PA #/Case ID/Reference #: [id number]

## 2023-09-17 DIAGNOSIS — I10 Essential (primary) hypertension: Secondary | ICD-10-CM | POA: Diagnosis not present

## 2023-09-17 DIAGNOSIS — G629 Polyneuropathy, unspecified: Secondary | ICD-10-CM | POA: Diagnosis not present

## 2023-09-17 DIAGNOSIS — F411 Generalized anxiety disorder: Secondary | ICD-10-CM | POA: Diagnosis not present

## 2023-09-17 DIAGNOSIS — N390 Urinary tract infection, site not specified: Secondary | ICD-10-CM | POA: Diagnosis not present

## 2023-09-17 DIAGNOSIS — I2510 Atherosclerotic heart disease of native coronary artery without angina pectoris: Secondary | ICD-10-CM | POA: Diagnosis not present

## 2023-09-17 DIAGNOSIS — N3944 Nocturnal enuresis: Secondary | ICD-10-CM | POA: Diagnosis not present

## 2023-09-17 DIAGNOSIS — C7951 Secondary malignant neoplasm of bone: Secondary | ICD-10-CM | POA: Diagnosis not present

## 2023-09-17 DIAGNOSIS — E663 Overweight: Secondary | ICD-10-CM | POA: Diagnosis not present

## 2023-09-17 DIAGNOSIS — F4320 Adjustment disorder, unspecified: Secondary | ICD-10-CM | POA: Diagnosis not present

## 2023-09-17 DIAGNOSIS — R7301 Impaired fasting glucose: Secondary | ICD-10-CM | POA: Diagnosis not present

## 2023-09-17 DIAGNOSIS — E782 Mixed hyperlipidemia: Secondary | ICD-10-CM | POA: Diagnosis not present

## 2023-09-17 DIAGNOSIS — M7989 Other specified soft tissue disorders: Secondary | ICD-10-CM | POA: Diagnosis not present

## 2023-09-17 DIAGNOSIS — Z23 Encounter for immunization: Secondary | ICD-10-CM | POA: Diagnosis not present

## 2023-09-22 ENCOUNTER — Other Ambulatory Visit: Payer: Self-pay

## 2023-09-22 DIAGNOSIS — Z170 Estrogen receptor positive status [ER+]: Secondary | ICD-10-CM

## 2023-09-22 NOTE — Progress Notes (Signed)
Lab order entered.

## 2023-09-23 ENCOUNTER — Inpatient Hospital Stay: Payer: Medicare Other

## 2023-09-25 ENCOUNTER — Inpatient Hospital Stay: Payer: Medicare Other

## 2023-09-25 VITALS — BP 140/72 | HR 72 | Temp 97.7°F | Resp 18

## 2023-09-25 DIAGNOSIS — Z79811 Long term (current) use of aromatase inhibitors: Secondary | ICD-10-CM | POA: Diagnosis not present

## 2023-09-25 DIAGNOSIS — C7951 Secondary malignant neoplasm of bone: Secondary | ICD-10-CM | POA: Diagnosis not present

## 2023-09-25 DIAGNOSIS — R978 Other abnormal tumor markers: Secondary | ICD-10-CM | POA: Diagnosis not present

## 2023-09-25 DIAGNOSIS — F419 Anxiety disorder, unspecified: Secondary | ICD-10-CM | POA: Diagnosis not present

## 2023-09-25 DIAGNOSIS — Z170 Estrogen receptor positive status [ER+]: Secondary | ICD-10-CM

## 2023-09-25 DIAGNOSIS — Z853 Personal history of malignant neoplasm of breast: Secondary | ICD-10-CM

## 2023-09-25 DIAGNOSIS — C50212 Malignant neoplasm of upper-inner quadrant of left female breast: Secondary | ICD-10-CM | POA: Diagnosis not present

## 2023-09-25 DIAGNOSIS — R7989 Other specified abnormal findings of blood chemistry: Secondary | ICD-10-CM | POA: Diagnosis not present

## 2023-09-25 LAB — COMPREHENSIVE METABOLIC PANEL
ALT: 10 U/L (ref 0–44)
AST: 16 U/L (ref 15–41)
Albumin: 3.7 g/dL (ref 3.5–5.0)
Alkaline Phosphatase: 54 U/L (ref 38–126)
Anion gap: 8 (ref 5–15)
BUN: 11 mg/dL (ref 8–23)
CO2: 22 mmol/L (ref 22–32)
Calcium: 8.1 mg/dL — ABNORMAL LOW (ref 8.9–10.3)
Chloride: 107 mmol/L (ref 98–111)
Creatinine, Ser: 0.91 mg/dL (ref 0.44–1.00)
GFR, Estimated: >60 mL/min (ref >60)
Glucose, Bld: 110 mg/dL — ABNORMAL HIGH (ref 70–99)
Potassium: 4 mmol/L (ref 3.5–5.1)
Sodium: 137 mmol/L (ref 135–145)
Total Bilirubin: 0.6 mg/dL (ref 0.3–1.2)
Total Protein: 7 g/dL (ref 6.5–8.1)

## 2023-09-25 MED ORDER — DENOSUMAB 120 MG/1.7ML ~~LOC~~ SOLN
120.0000 mg | Freq: Once | SUBCUTANEOUS | Status: AC
  Administered 2023-09-25: 120 mg via SUBCUTANEOUS
  Filled 2023-09-25: qty 1.7

## 2023-09-25 NOTE — Progress Notes (Signed)
Patient tolerated Xgeva injection with no complaints voiced.  Calcium today is 8.1.  Patient states she is taking Calcium and Vitamin D supplements daily.  Site clean and dry with no bruising or swelling noted.  No complaints of pain.  Discharged with vital signs stable and no signs or symptoms of distress noted.

## 2023-09-25 NOTE — Patient Instructions (Signed)
[facility]  Discharge Instructions: Thank you for choosing [facility] to provide your oncology and hematology care.  If you have a lab appointment with the Cancer Center - please note that after April 8th, 2024, all labs will be drawn in the cancer center.  You do not have to check in or register with the main entrance as you have in the past but will complete your check-in in the cancer center.  Wear comfortable clothing and clothing appropriate for easy access to any Portacath or PICC line.   We strive to give you quality time with your provider. You may need to reschedule your appointment if you arrive late (15 or more minutes).  Arriving late affects you and other patients whose appointments are after yours.  Also, if you miss three or more appointments without notifying the office, you may be dismissed from the clinic at the provider's discretion.      For prescription refill requests, have your pharmacy contact our office and allow 72 hours for refills to be completed.    Today you received the following: Xgeva.  Denosumab Injection (Oncology) What is this medication? DENOSUMAB (den oh SUE mab) prevents weakened bones caused by cancer. It may also be used to treat noncancerous bone tumors that cannot be removed by surgery. It can also be used to treat high calcium levels in the blood caused by cancer. It works by blocking a protein that causes bones to break down quickly. This slows down the release of calcium from bones, which lowers calcium levels in your blood. It also makes your bones stronger and less likely to break (fracture). This medicine may be used for other purposes; ask your health care provider or pharmacist if you have questions. COMMON BRAND NAME(S): XGEVA What should I tell my care team before I take this medication? They need to know if you have any of these conditions: Dental disease Having surgery or tooth  extraction Infection Kidney disease Low levels of calcium or vitamin D in the blood Malnutrition On hemodialysis Skin conditions or sensitivity Thyroid or parathyroid disease An unusual reaction to denosumab, other medications, foods, dyes, or preservatives Pregnant or trying to get pregnant Breast-feeding How should I use this medication? This medication is for injection under the skin. It is given by your care team in a hospital or clinic setting. A special MedGuide will be given to you before each treatment. Be sure to read this information carefully each time. Talk to your care team about the use of this medication in children. While it may be prescribed for children as young as 13 years for selected conditions, precautions do apply. Overdosage: If you think you have taken too much of this medicine contact a poison control center or emergency room at once. NOTE: This medicine is only for you. Do not share this medicine with others. What if I miss a dose? Keep appointments for follow-up doses. It is important not to miss your dose. Call your care team if you are unable to keep an appointment. What may interact with this medication? Do not take this medication with any of the following: Other medications containing denosumab This medication may also interact with the following: Medications that lower your chance of fighting infection Steroid medications, such as prednisone or cortisone This list may not describe all possible interactions. Give your health care provider a list of all the medicines, herbs, non-prescription drugs, or dietary supplements you use. Also tell them if  you smoke, drink alcohol, or use illegal drugs. Some items may interact with your medicine. What should I watch for while using this medication? Your condition will be monitored carefully while you are receiving this medication. You may need blood work while taking this medication. This medication may increase  your risk of getting an infection. Call your care team for advice if you get a fever, chills, sore throat, or other symptoms of a cold or flu. Do not treat yourself. Try to avoid being around people who are sick. You should make sure you get enough calcium and vitamin D while you are taking this medication, unless your care team tells you not to. Discuss the foods you eat and the vitamins you take with your care team. Some people who take this medication have severe bone, joint, or muscle pain. This medication may also increase your risk for jaw problems or a broken thigh bone. Tell your care team right away if you have severe pain in your jaw, bones, joints, or muscles. Tell your care team if you have any pain that does not go away or that gets worse. Talk to your care team if you may be pregnant. Serious birth defects can occur if you take this medication during pregnancy and for 5 months after the last dose. You will need a negative pregnancy test before starting this medication. Contraception is recommended while taking this medication and for 5 months after the last dose. Your care team can help you find the option that works for you. What side effects may I notice from receiving this medication? Side effects that you should report to your care team as soon as possible: Allergic reactions--skin rash, itching, hives, swelling of the face, lips, tongue, or throat Bone, joint, or muscle pain Low calcium level--muscle pain or cramps, confusion, tingling, or numbness in the hands or feet Osteonecrosis of the jaw--pain, swelling, or redness in the mouth, numbness of the jaw, poor healing after dental work, unusual discharge from the mouth, visible bones in the mouth Side effects that usually do not require medical attention (report to your care team if they continue or are bothersome): Cough Diarrhea Fatigue Headache Nausea This list may not describe all possible side effects. Call your doctor for  medical advice about side effects. You may report side effects to FDA at 1-800-FDA-1088. Where should I keep my medication? This medication is given in a hospital or clinic. It will not be stored at home. NOTE: This sheet is a summary. It may not cover all possible information. If you have questions about this medicine, talk to your doctor, pharmacist, or health care provider.  2024 Elsevier/Gold Standard (2022-04-03 00:00:00)     To help prevent nausea and vomiting after your treatment, we encourage you to take your nausea medication as directed.  BELOW ARE SYMPTOMS THAT SHOULD BE REPORTED IMMEDIATELY: *FEVER GREATER THAN 100.4 F (38 C) OR HIGHER *CHILLS OR SWEATING *NAUSEA AND VOMITING THAT IS NOT CONTROLLED WITH YOUR NAUSEA MEDICATION *UNUSUAL SHORTNESS OF BREATH *UNUSUAL BRUISING OR BLEEDING *URINARY PROBLEMS (pain or burning when urinating, or frequent urination) *BOWEL PROBLEMS (unusual diarrhea, constipation, pain near the anus) TENDERNESS IN MOUTH AND THROAT WITH OR WITHOUT PRESENCE OF ULCERS (sore throat, sores in mouth, or a toothache) UNUSUAL RASH, SWELLING OR PAIN  UNUSUAL VAGINAL DISCHARGE OR ITCHING   Items with * indicate a potential emergency and should be followed up as soon as possible or go to the Emergency Department if any problems should occur.  Please show the CHEMOTHERAPY ALERT CARD or IMMUNOTHERAPY ALERT CARD at check-in to the Emergency Department and triage nurse.  Should you have questions after your visit or need to cancel or reschedule your appointment, please contact [facility] [phone]  and follow the prompts.  Office hours are 8:00 a.m. to 4:30 p.m. Monday - Friday. Please note that voicemails left after 4:00 p.m. may not be returned until the following business day.  We are closed weekends and major holidays. You have access to a nurse at all times for urgent questions. Please call the main number to the clinic [phone] and  follow the prompts.  For any non-urgent questions, you may also contact your provider using MyChart. We now offer e-Visits for anyone 74 and older to request care online for non-urgent symptoms. For details visit [email or web address].   Also download the MyChart app! Go to the app store, search "MyChart", open the app, select Start, and log in with your MyChart username and password.

## 2023-10-21 ENCOUNTER — Inpatient Hospital Stay: Payer: Medicare Other

## 2023-10-21 ENCOUNTER — Inpatient Hospital Stay: Payer: Medicare Other | Attending: Hematology

## 2023-10-21 ENCOUNTER — Other Ambulatory Visit: Payer: Self-pay | Admitting: *Deleted

## 2023-10-21 VITALS — BP 130/78 | HR 71 | Temp 97.7°F | Resp 18

## 2023-10-21 DIAGNOSIS — Z87891 Personal history of nicotine dependence: Secondary | ICD-10-CM | POA: Insufficient documentation

## 2023-10-21 DIAGNOSIS — C50212 Malignant neoplasm of upper-inner quadrant of left female breast: Secondary | ICD-10-CM

## 2023-10-21 DIAGNOSIS — R978 Other abnormal tumor markers: Secondary | ICD-10-CM

## 2023-10-21 DIAGNOSIS — R7989 Other specified abnormal findings of blood chemistry: Secondary | ICD-10-CM | POA: Insufficient documentation

## 2023-10-21 DIAGNOSIS — M899 Disorder of bone, unspecified: Secondary | ICD-10-CM

## 2023-10-21 DIAGNOSIS — Z853 Personal history of malignant neoplasm of breast: Secondary | ICD-10-CM

## 2023-10-21 DIAGNOSIS — Z79899 Other long term (current) drug therapy: Secondary | ICD-10-CM | POA: Insufficient documentation

## 2023-10-21 DIAGNOSIS — R443 Hallucinations, unspecified: Secondary | ICD-10-CM | POA: Diagnosis not present

## 2023-10-21 DIAGNOSIS — R197 Diarrhea, unspecified: Secondary | ICD-10-CM | POA: Insufficient documentation

## 2023-10-21 DIAGNOSIS — F32A Depression, unspecified: Secondary | ICD-10-CM | POA: Diagnosis not present

## 2023-10-21 DIAGNOSIS — F419 Anxiety disorder, unspecified: Secondary | ICD-10-CM | POA: Insufficient documentation

## 2023-10-21 DIAGNOSIS — C7951 Secondary malignant neoplasm of bone: Secondary | ICD-10-CM | POA: Diagnosis not present

## 2023-10-21 DIAGNOSIS — Z170 Estrogen receptor positive status [ER+]: Secondary | ICD-10-CM

## 2023-10-21 DIAGNOSIS — Z79811 Long term (current) use of aromatase inhibitors: Secondary | ICD-10-CM | POA: Diagnosis not present

## 2023-10-21 LAB — COMPREHENSIVE METABOLIC PANEL
ALT: 11 U/L (ref 0–44)
AST: 16 U/L (ref 15–41)
Albumin: 3.7 g/dL (ref 3.5–5.0)
Alkaline Phosphatase: 41 U/L (ref 38–126)
Anion gap: 4 — ABNORMAL LOW (ref 5–15)
BUN: 12 mg/dL (ref 8–23)
CO2: 26 mmol/L (ref 22–32)
Calcium: 8.1 mg/dL — ABNORMAL LOW (ref 8.9–10.3)
Chloride: 110 mmol/L (ref 98–111)
Creatinine, Ser: 0.93 mg/dL (ref 0.44–1.00)
GFR, Estimated: 59 mL/min — ABNORMAL LOW (ref >60)
Glucose, Bld: 107 mg/dL — ABNORMAL HIGH (ref 70–99)
Potassium: 3.8 mmol/L (ref 3.5–5.1)
Sodium: 140 mmol/L (ref 135–145)
Total Bilirubin: 0.7 mg/dL (ref <1.2)
Total Protein: 6.9 g/dL (ref 6.5–8.1)

## 2023-10-21 MED ORDER — DENOSUMAB 120 MG/1.7ML ~~LOC~~ SOLN
120.0000 mg | Freq: Once | SUBCUTANEOUS | Status: AC
Start: 2023-10-21 — End: 2023-10-21
  Administered 2023-10-21: 120 mg via SUBCUTANEOUS
  Filled 2023-10-21: qty 1.7

## 2023-10-21 NOTE — Progress Notes (Signed)
Patient's Ca 8.1. per patient she takes Calcium and Vitamin D supplements at home. Pharmacy made aware. Per pharmacy to proceed with Xgeva shot today. Patient tolerated injection with no complaints voiced.  Site clean and dry with no bruising or swelling noted at site.  See MAR for details.  Band aid applied.  Patient stable during and after injection.  Vss with discharge and left in satisfactory condition with no s/s of distress noted. All follow ups as scheduled.   Kara Woods [employer]

## 2023-11-04 DIAGNOSIS — H524 Presbyopia: Secondary | ICD-10-CM | POA: Diagnosis not present

## 2023-11-04 DIAGNOSIS — H50112 Monocular exotropia, left eye: Secondary | ICD-10-CM | POA: Diagnosis not present

## 2023-11-04 DIAGNOSIS — H1812 Bullous keratopathy, left eye: Secondary | ICD-10-CM | POA: Diagnosis not present

## 2023-11-04 DIAGNOSIS — H5203 Hypermetropia, bilateral: Secondary | ICD-10-CM | POA: Diagnosis not present

## 2023-11-04 DIAGNOSIS — H401131 Primary open-angle glaucoma, bilateral, mild stage: Secondary | ICD-10-CM | POA: Diagnosis not present

## 2023-11-04 DIAGNOSIS — H52221 Regular astigmatism, right eye: Secondary | ICD-10-CM | POA: Diagnosis not present

## 2023-11-04 DIAGNOSIS — H18513 Endothelial corneal dystrophy, bilateral: Secondary | ICD-10-CM | POA: Diagnosis not present

## 2023-11-05 ENCOUNTER — Telehealth: Payer: Self-pay

## 2023-11-05 NOTE — Telephone Encounter (Signed)
Oral Oncology Patient Advocate Encounter   **[employer] PAP to [facility] in Jan 2025**  Was successful in securing patient a $$6,000.00 grant from [employer] to provide copayment coverage for [place].  This will keep the out of pocket expense at $0.      The billing information is as follows and has been shared with [facility].   Member ID: [id number] Group ID: [id number] RxBin: [id number] PCN: [id number] Dates of Eligibility: 10/07/23 through 10/06/24  Fund name:  Metastatic Breast Cancer.   Ardeen Fillers, CPhT Oncology Pharmacy Patient Advocate  [facility]  [phone] (phone) [phone] (fax) 11/05/2023

## 2023-11-06 ENCOUNTER — Other Ambulatory Visit: Payer: Self-pay

## 2023-11-06 ENCOUNTER — Other Ambulatory Visit: Payer: Self-pay | Admitting: *Deleted

## 2023-11-06 MED ORDER — PALBOCICLIB 75 MG PO TABS: 75.0000 mg | ORAL_TABLET | Freq: Every day | ORAL | 0 refills | Status: DC

## 2023-11-11 ENCOUNTER — Inpatient Hospital Stay: Payer: Medicare Other | Attending: Hematology

## 2023-11-11 DIAGNOSIS — M25473 Effusion, unspecified ankle: Secondary | ICD-10-CM | POA: Diagnosis not present

## 2023-11-11 DIAGNOSIS — Z79811 Long term (current) use of aromatase inhibitors: Secondary | ICD-10-CM | POA: Diagnosis not present

## 2023-11-11 DIAGNOSIS — R197 Diarrhea, unspecified: Secondary | ICD-10-CM | POA: Insufficient documentation

## 2023-11-11 DIAGNOSIS — C7951 Secondary malignant neoplasm of bone: Secondary | ICD-10-CM | POA: Diagnosis not present

## 2023-11-11 DIAGNOSIS — F32A Depression, unspecified: Secondary | ICD-10-CM | POA: Insufficient documentation

## 2023-11-11 DIAGNOSIS — Z807 Family history of other malignant neoplasms of lymphoid, hematopoietic and related tissues: Secondary | ICD-10-CM | POA: Insufficient documentation

## 2023-11-11 DIAGNOSIS — Z923 Personal history of irradiation: Secondary | ICD-10-CM | POA: Diagnosis not present

## 2023-11-11 DIAGNOSIS — C50212 Malignant neoplasm of upper-inner quadrant of left female breast: Secondary | ICD-10-CM | POA: Diagnosis not present

## 2023-11-11 DIAGNOSIS — F419 Anxiety disorder, unspecified: Secondary | ICD-10-CM | POA: Diagnosis not present

## 2023-11-11 DIAGNOSIS — Z79899 Other long term (current) drug therapy: Secondary | ICD-10-CM | POA: Diagnosis not present

## 2023-11-11 DIAGNOSIS — R978 Other abnormal tumor markers: Secondary | ICD-10-CM

## 2023-11-11 DIAGNOSIS — Z87891 Personal history of nicotine dependence: Secondary | ICD-10-CM | POA: Diagnosis not present

## 2023-11-11 DIAGNOSIS — R443 Hallucinations, unspecified: Secondary | ICD-10-CM | POA: Diagnosis not present

## 2023-11-11 DIAGNOSIS — Z170 Estrogen receptor positive status [ER+]: Secondary | ICD-10-CM

## 2023-11-11 LAB — CBC WITH DIFFERENTIAL/PLATELET
Abs Immature Granulocytes: 0.02 10*3/uL (ref 0.00–0.07)
Basophils Absolute: 0.1 10*3/uL (ref 0.0–0.1)
Basophils Relative: 1 %
Eosinophils Absolute: 0 10*3/uL (ref 0.0–0.5)
Eosinophils Relative: 1 %
HCT: 36.5 % (ref 36.0–46.0)
Hemoglobin: 12.2 g/dL (ref 12.0–15.0)
Immature Granulocytes: 0 %
Lymphocytes Relative: 47 %
Lymphs Abs: 2.3 10*3/uL (ref 0.7–4.0)
MCH: 36.2 pg — ABNORMAL HIGH (ref 26.0–34.0)
MCHC: 33.4 g/dL (ref 30.0–36.0)
MCV: 108.3 fL — ABNORMAL HIGH (ref 80.0–100.0)
Monocytes Absolute: 0.5 10*3/uL (ref 0.1–1.0)
Monocytes Relative: 10 %
Neutro Abs: 2 10*3/uL (ref 1.7–7.7)
Neutrophils Relative %: 41 %
Platelets: 179 10*3/uL (ref 150–400)
RBC: 3.37 MIL/uL — ABNORMAL LOW (ref 3.87–5.11)
RDW: 14.5 % (ref 11.5–15.5)
WBC: 5 10*3/uL (ref 4.0–10.5)
nRBC: 0 % (ref 0.0–0.2)

## 2023-11-11 LAB — MAGNESIUM: Magnesium: 1.8 mg/dL (ref 1.7–2.4)

## 2023-11-13 ENCOUNTER — Encounter (HOSPITAL_COMMUNITY)
Admission: RE | Admit: 2023-11-13 | Discharge: 2023-11-13 | Disposition: A | Discharge status: Home / Self Care | Payer: Medicare Other | Source: Ambulatory Visit | Attending: Hematology | Admitting: Hematology

## 2023-11-13 DIAGNOSIS — R978 Other abnormal tumor markers: Secondary | ICD-10-CM | POA: Diagnosis not present

## 2023-11-13 DIAGNOSIS — C50212 Malignant neoplasm of upper-inner quadrant of left female breast: Secondary | ICD-10-CM | POA: Diagnosis not present

## 2023-11-13 DIAGNOSIS — C50919 Malignant neoplasm of unspecified site of unspecified female breast: Secondary | ICD-10-CM | POA: Diagnosis not present

## 2023-11-13 DIAGNOSIS — Z170 Estrogen receptor positive status [ER+]: Secondary | ICD-10-CM

## 2023-11-13 LAB — CANCER ANTIGEN 15-3: CA 15-3: 27.4 U/mL — ABNORMAL HIGH (ref 0.0–25.0)

## 2023-11-13 LAB — CANCER ANTIGEN 27.29: CA 27.29: 35.3 U/mL (ref 0.0–38.6)

## 2023-11-13 MED ORDER — FLUDEOXYGLUCOSE F - 18 (FDG) INJECTION
6.4700 | Freq: Once | INTRAVENOUS | Status: AC | PRN
  Administered 2023-11-13: 6.47 via INTRAVENOUS

## 2023-11-18 ENCOUNTER — Inpatient Hospital Stay (HOSPITAL_BASED_OUTPATIENT_CLINIC_OR_DEPARTMENT_OTHER): Payer: Medicare Other | Admitting: Hematology

## 2023-11-18 ENCOUNTER — Inpatient Hospital Stay: Payer: Medicare Other

## 2023-11-18 ENCOUNTER — Encounter: Payer: Self-pay | Admitting: Hematology

## 2023-11-18 VITALS — BP 125/84 | HR 88 | Temp 98.8°F | Resp 18 | Wt 157.4 lb

## 2023-11-18 DIAGNOSIS — F32A Depression, unspecified: Secondary | ICD-10-CM | POA: Diagnosis not present

## 2023-11-18 DIAGNOSIS — C50212 Malignant neoplasm of upper-inner quadrant of left female breast: Secondary | ICD-10-CM | POA: Diagnosis not present

## 2023-11-18 DIAGNOSIS — C7951 Secondary malignant neoplasm of bone: Secondary | ICD-10-CM | POA: Diagnosis not present

## 2023-11-18 DIAGNOSIS — R197 Diarrhea, unspecified: Secondary | ICD-10-CM | POA: Diagnosis not present

## 2023-11-18 DIAGNOSIS — Z79811 Long term (current) use of aromatase inhibitors: Secondary | ICD-10-CM | POA: Diagnosis not present

## 2023-11-18 DIAGNOSIS — Z170 Estrogen receptor positive status [ER+]: Secondary | ICD-10-CM

## 2023-11-18 DIAGNOSIS — F419 Anxiety disorder, unspecified: Secondary | ICD-10-CM | POA: Diagnosis not present

## 2023-11-18 NOTE — Progress Notes (Signed)
Patient is taking Ibrance as prescribed.  She has not missed any doses and reports no side effects at this time.   

## 2023-11-18 NOTE — Progress Notes (Signed)
Hold xgeva today per Dr. Ellin Saba

## 2023-11-18 NOTE — Progress Notes (Signed)
[facility] [address], [place] [address]    Clinic Day:  11/18/2023  Referring physician: Benita Stabile, MD  Patient Care Team: Benita Stabile, MD as PCP - General (Internal Medicine) Lennette Bihari, MD as PCP - Cardiology (Cardiology) Jena Gauss Gerrit Friends, MD (Gastroenterology) Therese Sarah, RN as Oncology Nurse Navigator (Oncology) Clinton Gallant, RN as [facility] Care Management Clinton Gallant, RN as [facility] Care Management   ASSESSMENT & PLAN:   Assessment: 1.  T2N0M1 left breast infiltrating lobular carcinoma: -Status post lumpectomy in June 2014, 1.4 cm, grade 1, lymphovascular invasion positive, ER 100%, PR 26%, Ki-67 18%. -She underwent XRT.  She did not take adjuvant endocrine therapy. -Mammogram on 07/13/2020, BI-RADS Category 1. -CEA was 10.3, Ca1 2515.8 and CA 19-9 05. -PET scan on 08/07/2020 shows widespread hypermetabolic bone meta stasis with soft tissue components at T1 and sacrum.  Thoracic nodal metastasis. -MRI of the thoracic spine on 08/15/2020 shows pathological fracture of T1 with mild osseous retropulsion and possible left C8 nerve root encroachment within the foramen at C7-T1.  No cord deformity. -Right third rib biopsy on 08/22/2020 consistent with metastatic breast cancer, ER 100% positive, PR 40% positive, Ki-67 10%, HER-2 2+ and negative by FISH. -Ibrance 100 mg 3 weeks on 1 week off on anastrozole 1 mg daily started on 08/31/2020. -Ibrance held on 10/13/2020 secondary to severe tiredness. - PET scan on 02/19/2021 shows improvement in metastatic disease.   2.  Social/family history: -She quit smoking 20 years ago, smoked 2 packs/day for more than 20 years prior to quitting. -She lives at home by herself and is independent of all activities. -Daughter who has multiple myeloma at age 80.    Plan: 1.  Metastatic breast cancer to the bones, ER/PR positive, HER2 negative: - She is tolerating  Ibrance and anastrozole well.  We reviewed labs from 11/11/2023 which showed CA 15-3 was 27.4 and stable.  CBC was normal. - Reviewed PET scan from 11/13/2023: No hypermetabolic metastatic disease. - Discussed that she continue Ibrance 75 mg 3 weeks on/1 week off and anastrozole daily until progression. - She will come back in 3 months with repeat labs including tumor markers.   2.  Bone metastasis: - She has started back denosumab.  Her calcium has been low at 8.1 last 2 times.  Will hold denosumab today.  She was told to start taking calcium supplements.   3.  Depression/anxiety: - She is continuing Lexapro and clorazepate as needed for anxiety.  Also continuing olanzapine 2.5 mg twice daily for hallucinations.   4.  Diarrhea: - She has intermittent diarrhea.  Continue Imodium as needed.    Orders Placed This Encounter  Procedures   Comprehensive metabolic panel   CBC with Differential    Standing Status:   Future    Expected Date:   02/10/2024    Expiration Date:   11/17/2024   Comprehensive metabolic panel    Standing Status:   Future    Expected Date:   02/10/2024    Expiration Date:   11/17/2024   Cancer antigen 15-3    Standing Status:   Future    Expected Date:   02/10/2024    Expiration Date:   11/17/2024   Cancer antigen 27.29    Standing Status:   Future    Expected Date:   02/10/2024    Expiration Date:   11/17/2024      [name]  R Teague,acting as a Neurosurgeon for Doreatha Massed, MD.,have documented all relevant documentation on the behalf of Doreatha Massed, MD,as directed by  Doreatha Massed, MD while in the presence of Doreatha Massed, MD.  I, Doreatha Massed MD, have reviewed the above documentation for accuracy and completeness, and I agree with the above.    Doreatha Massed, MD   12/24/202411:28 AM  CHIEF COMPLAINT:   Diagnosis: left breast cancer    Cancer Staging  History of breast cancer Staging form: Breast, AJCC 7th  Edition - Clinical stage from 04/20/2013: Stage IIA (T2, N0, cM0) - Unsigned - Pathologic: No stage assigned - Unsigned    Prior Therapy: 1. Left lumpectomy in June 2014  2.  XRT  Current Therapy:  Ibrance and anastrozole    HISTORY OF PRESENT ILLNESS:   Oncology History   No history exists.     INTERVAL HISTORY:   Kara Woods is a 89 y.o. female presenting to clinic today for follow up of left breast cancer. She was last seen by me on 08/26/23.  Since her last visit, she underwent restaging PET on 11/13/23 that found: no hypermetabolic metastatic disease and non tracer avid osseous lesions are similar, most consistent with treated metastasis.   Today, she states that she is doing well overall. Her appetite level is at 100%. Her energy level is at 0%. She is accompanied by her daughter. She is tolerating anastrozole well. She finished her dental work. She denies any dental issues. She is taking all medications as prescribed. She has on and off diarrhea, she treats with Imodium prn. She denies any recent infections in the last 6 months. She reports ankle swellings with an onset of 6 months, though it is very mild today. She does not take calcium supplements.   PAST MEDICAL HISTORY:   Past Medical History: Past Medical History:  Diagnosis Date   Anxiety    Arthritis    Blind left eye    Breast cancer (HCC)    left    Colitis    Coronary artery disease    a. 10/2016 Staged PCI: RCA 50p, 69m (2.25x12 Resolute Onyx DES), LCX 50p, 73m (2.75x23 Xience Alpine DES). Residual LAD 50p/m, 81m/d.   Cystocele 10/11/2013   Depression    Diastolic dysfunction    a. 09/2016 Echo: EF 50%, diffuse hypokinesis, mild LVH, grade 1 diastolic dysfunction, mild mitral regurgitation, mildly dilated left atrium.   GERD (gastroesophageal reflux disease)    occasional Tums only   HOH (hard of hearing)    Pelvic relaxation 10/11/2013   Proctitis    colonsocopy 2007, Canasa suppositories   S/P endoscopy  March 2009   mild erosive reflux esophagitis, Schatzki's ring, s/p dilation   Schatzki's ring    Shortness of breath    occ if anxiety   Wears dentures    upper   Wears glasses     Surgical History: Past Surgical History:  Procedure Laterality Date   ABDOMINAL HYSTERECTOMY     ANTERIOR AND POSTERIOR REPAIR N/A 05/17/2014   Procedure: ANTERIOR (CYSTOCELE) AND POSTERIOR REPAIR (RECTOCELE);  Surgeon: Martina Sinner, MD;  Location: [facility];  Service: Urology;  Laterality: N/A;   APPENDECTOMY     BREAST LUMPECTOMY WITH NEEDLE LOCALIZATION AND AXILLARY SENTINEL LYMPH NODE BX Left 05/03/2013   Procedure: BREAST LUMPECTOMY WITH NEEDLE LOCALIZATION AND AXILLARY SENTINEL LYMPH NODE BX;  Surgeon: Mariella Saa, MD;  Location: [facility];  Service: General;  Laterality: Left;   BREAST SURGERY  Left 05/19/13   CARDIAC CATHETERIZATION  1998   CARDIAC CATHETERIZATION N/A 11/04/2016   Procedure: Left Heart Cath and Coronary Angiography;  Surgeon: Lennette Bihari, MD;  Location: [facility] IN[facility]ASIVE CV LAB;  Service: Cardiovascular;  Laterality: N/A;   CARDIAC CATHETERIZATION N/A 11/05/2016   Procedure: Coronary Stent Intervention;  Surgeon: Lennette Bihari, MD;  Location: [facility] INVASIVE CV LAB;  Service: Cardiovascular;  Laterality: N/A;   CHOLECYSTECTOMY     COLONOSCOPY  05/06/2006   Diffuse inflammatory changes of the rectal mucosa, consistent  with proctitis.  Otherwise, normal colon to terminal ileum   CORONARY ANGIOPLASTY  11/04/2016   CORONARY STENT PLACEMENT     Drug-eluting coronary artery stent, non-bioabsorbable-polymer-coated   CYSTOSCOPY N/A 05/17/2014   Procedure: CYSTOSCOPY;  Surgeon: Martina Sinner, MD;  Location: [facility];  Service: Urology;  Laterality: N/A;   ESOPHAGOGASTRODUODENOSCOPY  02/09/2008   Schatzki ring status post dilation/Distal esophageal erosion consistent with mild erosive reflux  esophagitis, otherwise unremarkable esophagus, normal stomach, D1, D2.   EYE  SURGERY     lt cataract-implant   EYE SURGERY     lt-lens repaced,l   LUMBAR DISC SURGERY  1993   RE-EXCISION OF BREAST LUMPECTOMY Left 05/19/2013   Procedure: RE-EXCISION OF BREAST LUMPECTOMY;  Surgeon: Mariella Saa, MD;  Location: [facility];  Service: General;  Laterality: Left;   RETINAL DETACHMENT SURGERY  1978   Left   VAGINAL HYSTERECTOMY Bilateral 05/17/2014   Procedure: HYSTERECTOMY VAGINAL with Bilateral Salpingo-Oophorectomy; Bladder cystotomy repair;  Surgeon: Serita Kyle, MD;  Location: [facility];  Service: Gynecology;  Laterality: Bilateral;   VAGINAL PROLAPSE REPAIR N/A 05/17/2014   Procedure: VAGINAL VAULT PROLAPSE AND GRAFT;  Surgeon: Martina Sinner, MD;  Location: [facility];  Service: Urology;  Laterality: N/A;    Social History: Social History   Socioeconomic History   Marital status: Widowed    Spouse name: Not on file   Number of children: 6   Years of education: Not on file   Highest education level: Not on file  Occupational History   Occupation: Retired  Tobacco Use   Smoking status: Former    Current packs/day: 0.00    Types: Cigarettes    Quit date: 04/28/1990    Years since quitting: 33.5   Smokeless tobacco: Never   Tobacco comments:    quit 28 yrs ago  Substance and Sexual Activity   Alcohol use: Yes    Alcohol/week: 1.0 standard drink of alcohol    Types: 1 Glasses of wine per week    Comment: occ   Drug use: No   Sexual activity: Not Currently    Birth control/protection: Post-menopausal  Other Topics Concern   Not on file  Social History Narrative    02/14/23 lives at home by herself and is independent of all activities.   all her children live about a mile away. Daughter are handling her finances and also helping with her bills.    -Daughter who has multiple myeloma at age 28   Social Drivers of Health   Financial Resource Strain: Low Risk  (10/07/2022)   Overall Financial Resource Strain (CARDIA)     Difficulty of Paying Living Expenses: Not hard at all  Food Insecurity: No Food Insecurity (10/07/2022)   Hunger Vital Sign    Worried About Running Out of Food in the Last Year: Never true    Ran Out of Food in the Last Year: Never true  Transportation Needs: No  Transportation Needs (10/07/2022)   PRAPARE - Administrator, Civil Service (Medical): No    Lack of Transportation (Non-Medical): No  Physical Activity: Insufficiently Active (09/27/2020)   Exercise Vital Sign    Days of Exercise per Week: 2 days    Minutes of Exercise per Session: 10 min  Stress: No Stress Concern Present (01/09/2023)   [employer] of Occupational Health - Occupational Stress Questionnaire    Feeling of Stress : Only a little  Social Connections: Socially Isolated (09/27/2020)   Social Connection and Isolation Panel [NHANES]    Frequency of Communication with Friends and Family: More than three times a week    Frequency of Social Gatherings with Friends and Family: More than three times a week    Attends Religious Services: Never    Database administrator or Organizations: No    Attends Banker Meetings: Never    Marital Status: Widowed  Intimate Partner Violence: Not At Risk (01/09/2023)   Humiliation, Afraid, Rape, and Kick questionnaire    Fear of Current or Ex-Partner: No    Emotionally Abused: No    Physically Abused: No    Sexually Abused: No    Family History: Family History  Problem Relation Age of Onset   Cancer Brother        spinal   Heart attack Mother    Heart attack Father    Heart attack Son    Heart attack Son    Depression Son    Multiple myeloma Daughter    Anemia Daughter    Colon cancer Neg Hx     Current Medications:  Current Outpatient Medications:    acetaminophen (TYLENOL) 325 MG tablet, Take 2 tablets (650 mg total) by mouth every 6 (six) hours as needed for mild pain or headache (or temp > 37.5 C (99.5 F))., Disp: 40 tablet, Rfl: 0    anastrozole (ARIMIDEX) 1 MG tablet, TAKE 1 TABLET BY MOUTH EVERY DAY, Disp: 90 tablet, Rfl: 2   aspirin 81 MG EC tablet, Take 1 tablet (81 mg total) by mouth daily. Swallow whole., Disp: 60 tablet, Rfl: 2   carboxymethylcellulose (REFRESH PLUS) 0.5 % SOLN, Place 3-4 drops into both eyes 3 (three) times daily as needed (dry eyes). Preservative free, Disp: , Rfl:    escitalopram (LEXAPRO) 20 MG tablet, TAKE 1 TABLET BY MOUTH EVERY DAY, Disp: 90 tablet, Rfl: 2   latanoprost (XALATAN) 0.005 % ophthalmic solution, Place 1 drop into both eyes at bedtime., Disp: , Rfl:    metoprolol tartrate (LOPRESSOR) 25 MG tablet, Take 1 tablet (25 mg total) by mouth 2 (two) times daily., Disp: 180 tablet, Rfl: 3   nitroGLYCERIN (NITROSTAT) 0.4 MG SL tablet, Place 1 tablet (0.4 mg total) under the tongue every 5 (five) minutes as needed., Disp: 25 tablet, Rfl: 3   OLANZapine (ZYPREXA) 2.5 MG tablet, TAKE 1 TABLET (2.5 MG TOTAL) BY MOUTH 2 (TWO) TIMES DAILY. AND AN ADDITIONAL TAB IN THE MORNING, Disp: 180 tablet, Rfl: 4   palbociclib (IBRANCE) 75 MG tablet, Take 1 tablet (75 mg total) by mouth daily. Take for 21 days on, 7 days off, repeat every 28 days., Disp: 21 tablet, Rfl: 0   Allergies: Allergies  Allergen Reactions   Contrast Media [Iodinated Contrast Media] Anaphylaxis    Adverse reaction; pt hyperventilating, c/o CP and SOB    Lipitor [Atorvastatin] Other (See Comments)    Muscle aches, cramps, fatigue, weight loss/poor appetite   Sulfamethoxazole Other (  See Comments)    Reaction was years ago unknown   Sulfonamide Derivatives Other (See Comments)    Dizziness     REVIEW OF SYSTEMS:   Review of Systems  Constitutional:  Negative for chills, fatigue and fever.  HENT:   Negative for lump/mass, mouth sores, nosebleeds, sore throat and trouble swallowing.   Eyes:  Negative for eye problems.  Respiratory:  Positive for shortness of breath. Negative for cough.   Cardiovascular:  Negative for chest pain,  leg swelling and palpitations.  Gastrointestinal:  Negative for abdominal pain, constipation, diarrhea, nausea and vomiting.  Genitourinary:  Negative for bladder incontinence, difficulty urinating, dysuria, frequency, hematuria and nocturia.   Musculoskeletal:  Negative for arthralgias, back pain, flank pain, myalgias and neck pain.       +pain on right flank, 4/10 severity  Skin:  Negative for itching and rash.  Neurological:  Positive for dizziness and numbness (in hands). Negative for headaches.  Hematological:  Does not bruise/bleed easily.  Psychiatric/Behavioral:  Negative for depression, sleep disturbance and suicidal ideas. The patient is not nervous/anxious.   All other systems reviewed and are negative.    VITALS:   Blood pressure 125/84, pulse 88, temperature 98.8 F (37.1 C), temperature source Oral, resp. rate 18, weight 157 lb 6.4 oz (71.4 kg), SpO2 97%.  Wt Readings from Last 3 Encounters:  11/18/23 157 lb 6.4 oz (71.4 kg)  08/26/23 156 lb 9.6 oz (71 kg)  05/14/23 156 lb 11.2 oz (71.1 kg)    Body mass index is 27.02 kg/m.  Performance status (ECOG): 1 - Symptomatic but completely ambulatory  PHYSICAL EXAM:   Physical Exam Vitals and nursing note reviewed. Exam conducted with a chaperone present.  Constitutional:      Appearance: Normal appearance.  Cardiovascular:     Rate and Rhythm: Normal rate and regular rhythm.     Pulses: Normal pulses.     Heart sounds: Normal heart sounds.  Pulmonary:     Effort: Pulmonary effort is normal.     Breath sounds: Normal breath sounds.  Abdominal:     Palpations: Abdomen is soft. There is no hepatomegaly, splenomegaly or mass.     Tenderness: There is no abdominal tenderness.  Musculoskeletal:     Right lower leg: No edema.     Left lower leg: No edema.  Lymphadenopathy:     Cervical: No cervical adenopathy.     Right cervical: No superficial, deep or posterior cervical adenopathy.    Left cervical: No superficial,  deep or posterior cervical adenopathy.     Upper Body:     Right upper body: No supraclavicular or axillary adenopathy.     Left upper body: No supraclavicular or axillary adenopathy.  Neurological:     General: No focal deficit present.     Mental Status: She is alert and oriented to person, place, and time.  Psychiatric:        Mood and Affect: Mood normal.        Behavior: Behavior normal.     LABS:      Latest Ref Rng & Units 11/11/2023    1:29 PM 08/19/2023    1:02 PM 05/08/2023   11:25 AM  CBC  WBC 4.0 - 10.5 K/uL 5.0  4.2  4.9   Hemoglobin 12.0 - 15.0 g/dL 16.1  09.6  04.5   Hematocrit 36.0 - 46.0 % 36.5  36.5  36.9   Platelets 150 - 400 K/uL 179  189  214  Latest Ref Rng & Units 10/21/2023    1:43 PM 09/25/2023    1:23 PM 08/19/2023    1:02 PM  CMP  Glucose 70 - 99 mg/dL 025  427  93   BUN 8 - 23 mg/dL 12  11  13    Creatinine 0.44 - 1.00 mg/dL 0.62  3.76  2.83   Sodium 135 - 145 mmol/L 140  137  137   Potassium 3.5 - 5.1 mmol/L 3.8  4.0  3.9   Chloride 98 - 111 mmol/L 110  107  102   CO2 22 - 32 mmol/L 26  22  25    Calcium 8.9 - 10.3 mg/dL 8.1  8.1  9.0   Total Protein 6.5 - 8.1 g/dL 6.9  7.0  7.1   Total Bilirubin <1.2 mg/dL 0.7  0.6  0.5   Alkaline Phos 38 - 126 U/L 41  54  53   AST 15 - 41 U/L 16  16  18    ALT 0 - 44 U/L 11  10  10       No results found for: "CEA1", "CEA" / No results found for: "CEA1", "CEA" No results found for: "PSA1" Lab Results  Component Value Date   TDV761 6 10/09/2021   No results found for: "YWV371"  No results found for: "TOTALPROTELP", "ALBUMINELP", "A1GS", "A2GS", "BETS", "BETA2SER", "GAMS", "MSPIKE", "SPEI" No results found for: "TIBC", "FERRITIN", "IRONPCTSAT" No results found for: "LDH"   STUDIES:   NM PET Image Restag (PS) Skull Base To Thigh Result Date: 11/18/2023 CLINICAL DATA:  Subsequent treatment strategy for restaging of breast cancer. EXAM: NUCLEAR MEDICINE PET SKULL BASE TO THIGH TECHNIQUE: 6.5 mCi  F-18 FDG was injected intravenously. Full-ring PET imaging was performed from the skull base to thigh after the radiotracer. CT data was obtained and used for attenuation correction and anatomic localization. Fasting blood glucose: 109 mg/dl COMPARISON:  05/20/9484 FINDINGS: Mediastinal blood pool activity: SUV max 2.4 Liver activity: SUV max NA NECK: No areas of abnormal hypermetabolism. Incidental CT findings: No cervical adenopathy. Bilateral carotid atherosclerosis. CHEST: The right hilar mild hypermetabolism is no longer identified. No pulmonary parenchymal hypermetabolism. Incidental CT findings: Mild cardiomegaly. Aortic and coronary artery calcification. Pulmonary artery enlargement, outflow tract 3.3 cm. Right apical scarring. Centrilobular emphysema. ABDOMEN/PELVIS: No abdominopelvic parenchymal or nodal hypermetabolism. Incidental CT findings: Cholecystectomy. Normal adrenal glands. Abdominal aortic atherosclerosis. Hysterectomy. Pelvic floor laxity. SKELETON: No abnormal marrow activity. Incidental CT findings: Osteopenia. Similar appearance of scattered lytic lesions including throughout the thoracolumbar spine. Old bilateral rib fractures. IMPRESSION: 1. No hypermetabolic metastatic disease. 2. Non tracer avid osseous lesions are similar, most consistent with treated metastasis. 3. Incidental findings, including: Coronary artery atherosclerosis. Aortic Atherosclerosis (ICD10-I70.0). Emphysema (ICD10-J43.9). Pulmonary artery enlargement suggests pulmonary arterial hypertension. Electronically Signed   By: Jeronimo Greaves M.D.   On: 11/18/2023 08:29

## 2023-11-18 NOTE — Patient Instructions (Addendum)
[facility] at [facility] Discharge Instructions   You were seen and examined today by Dr. Ellin Saba.  He reviewed the results of your lab work which are normal/stable. Your tumor markers are stable.   He reviewed the results of your PET scan which is very good. The cancer is not active at this time.   Continue Ibrance and anastrozole as prescribed.  Start taking calcium chews one a day.   We will see you back in 3 months. We will repeat lab work prior to this visit.    Thank you for choosing [facility] at [facility] to provide your oncology and hematology care.  To afford each patient quality time with our provider, please arrive at least 15 minutes before your scheduled appointment time.   If you have a lab appointment with the Cancer Center please come in thru the Main Entrance and check in at the main information desk.  You need to re-schedule your appointment should you arrive 10 or more minutes late.  We strive to give you quality time with our providers, and arriving late affects you and other patients whose appointments are after yours.  Also, if you no show three or more times for appointments you may be dismissed from the clinic at the providers discretion.     Again, thank you for choosing [facility].  Our hope is that these requests will decrease the amount of time that you wait before being seen by our physicians.       _____________________________________________________________  Should you have questions after your visit to [facility], please contact our office at [phone] and follow the prompts.  Our office hours are 8:00 a.m. and 4:30 p.m. Monday - Friday.  Please note that voicemails left after 4:00 p.m. may not be returned until the following business day.  We are closed weekends and major holidays.  You do have access to a nurse 24-7, just call the main number to the clinic [phone] and  do not press any options, hold on the line and a nurse will answer the phone.    For prescription refill requests, have your pharmacy contact our office and allow 72 hours.    Due to Covid, you will need to wear a mask upon entering the hospital. If you do not have a mask, a mask will be given to you at the Main Entrance upon arrival. For doctor visits, patients may have 1 support person age 64 or older with them. For treatment visits, patients can not have anyone with them due to social distancing guidelines and our immunocompromised population.

## 2023-11-20 NOTE — Telephone Encounter (Signed)
Called and spoke with patient's daughter, Selena Batten, regarding grant and transitioning to [employer] for 2025. Patient's daughter indicated that patient has enough medication on hand to last through January and requested I call back on Monday, 12/22/23, to schedule delivery. Reminder has been set.    Ardeen Fillers, CPhT Oncology Pharmacy Patient Advocate  [facility]  [phone] (phone) [phone] (fax) 11/20/2023 2:09 PM

## 2023-12-15 ENCOUNTER — Other Ambulatory Visit: Payer: Self-pay

## 2023-12-15 DIAGNOSIS — Z170 Estrogen receptor positive status [ER+]: Secondary | ICD-10-CM

## 2023-12-16 ENCOUNTER — Inpatient Hospital Stay: Payer: Medicare Other

## 2023-12-16 ENCOUNTER — Ambulatory Visit: Payer: Medicare Other

## 2023-12-17 ENCOUNTER — Inpatient Hospital Stay: Payer: Medicare Other | Attending: Hematology

## 2023-12-17 ENCOUNTER — Inpatient Hospital Stay: Payer: Medicare Other

## 2023-12-17 VITALS — BP 117/64 | HR 94 | Temp 97.4°F | Resp 18

## 2023-12-17 DIAGNOSIS — M25473 Effusion, unspecified ankle: Secondary | ICD-10-CM | POA: Diagnosis not present

## 2023-12-17 DIAGNOSIS — F419 Anxiety disorder, unspecified: Secondary | ICD-10-CM | POA: Diagnosis not present

## 2023-12-17 DIAGNOSIS — Z170 Estrogen receptor positive status [ER+]: Secondary | ICD-10-CM

## 2023-12-17 DIAGNOSIS — R443 Hallucinations, unspecified: Secondary | ICD-10-CM | POA: Diagnosis not present

## 2023-12-17 DIAGNOSIS — Z79811 Long term (current) use of aromatase inhibitors: Secondary | ICD-10-CM | POA: Insufficient documentation

## 2023-12-17 DIAGNOSIS — Z923 Personal history of irradiation: Secondary | ICD-10-CM | POA: Diagnosis not present

## 2023-12-17 DIAGNOSIS — C7951 Secondary malignant neoplasm of bone: Secondary | ICD-10-CM | POA: Diagnosis not present

## 2023-12-17 DIAGNOSIS — F32A Depression, unspecified: Secondary | ICD-10-CM | POA: Diagnosis not present

## 2023-12-17 DIAGNOSIS — Z87891 Personal history of nicotine dependence: Secondary | ICD-10-CM | POA: Diagnosis not present

## 2023-12-17 DIAGNOSIS — Z853 Personal history of malignant neoplasm of breast: Secondary | ICD-10-CM

## 2023-12-17 DIAGNOSIS — Z807 Family history of other malignant neoplasms of lymphoid, hematopoietic and related tissues: Secondary | ICD-10-CM | POA: Diagnosis not present

## 2023-12-17 DIAGNOSIS — C50212 Malignant neoplasm of upper-inner quadrant of left female breast: Secondary | ICD-10-CM | POA: Insufficient documentation

## 2023-12-17 DIAGNOSIS — R197 Diarrhea, unspecified: Secondary | ICD-10-CM | POA: Diagnosis not present

## 2023-12-17 DIAGNOSIS — Z79899 Other long term (current) drug therapy: Secondary | ICD-10-CM | POA: Insufficient documentation

## 2023-12-17 LAB — COMPREHENSIVE METABOLIC PANEL
ALT: 8 U/L (ref 0–44)
AST: 15 U/L (ref 15–41)
Albumin: 3.8 g/dL (ref 3.5–5.0)
Alkaline Phosphatase: 37 U/L — ABNORMAL LOW (ref 38–126)
Anion gap: 8 (ref 5–15)
BUN: 16 mg/dL (ref 8–23)
CO2: 26 mmol/L (ref 22–32)
Calcium: 9 mg/dL (ref 8.9–10.3)
Chloride: 103 mmol/L (ref 98–111)
Creatinine, Ser: 1.02 mg/dL — ABNORMAL HIGH (ref 0.44–1.00)
GFR, Estimated: 53 mL/min — ABNORMAL LOW (ref >60)
Glucose, Bld: 110 mg/dL — ABNORMAL HIGH (ref 70–99)
Potassium: 4.1 mmol/L (ref 3.5–5.1)
Sodium: 137 mmol/L (ref 135–145)
Total Bilirubin: 0.7 mg/dL (ref 0.0–1.2)
Total Protein: 6.8 g/dL (ref 6.5–8.1)

## 2023-12-17 MED ORDER — DENOSUMAB 120 MG/1.7ML ~~LOC~~ SOLN
120.0000 mg | Freq: Once | SUBCUTANEOUS | Status: AC
  Administered 2023-12-17: 120 mg via SUBCUTANEOUS
  Filled 2023-12-17: qty 1.7

## 2023-12-17 NOTE — Progress Notes (Signed)
Patient presents today for Xgeva injection. Vital signs stable. Calcium 9.0. Creatinine 1.02. Patient denies any major upcoming dental work or jaw pain.  Patient states she is taking over the counter calcium supplements BID but unable to tell me the name of the calcium supplement.   Kara Woods presents today for injection per the provider's orders.  Xgeva administration without incident; injection site WNL; see MAR for injection details.  Patient tolerated procedure well and without incident.  No questions or complaints noted at this time. Discharged from clinic ambulatory in stable condition. Alert and oriented x 3. F/U with [facility] as scheduled.

## 2023-12-17 NOTE — Patient Instructions (Signed)
[facility]  - A DEPT OF [facility]  Discharge Instructions: Thank you for choosing [facility] to provide your oncology and hematology care.  If you have a lab appointment with the Cancer Center - please note that after April 8th, 2024, all labs will be drawn in the cancer center.  You do not have to check in or register with the main entrance as you have in the past but will complete your check-in in the cancer center.  Wear comfortable clothing and clothing appropriate for easy access to any Portacath or PICC line.   We strive to give you quality time with your provider. You may need to reschedule your appointment if you arrive late (15 or more minutes).  Arriving late affects you and other patients whose appointments are after yours.  Also, if you miss three or more appointments without notifying the office, you may be dismissed from the clinic at the provider's discretion.      For prescription refill requests, have your pharmacy contact our office and allow 72 hours for refills to be completed.    Today you received the following chemotherapy and/or immunotherapy agents Xgeva Denosumab Injection (Oncology) What is this medication? DENOSUMAB (den oh SUE mab) prevents weakened bones caused by cancer. It may also be used to treat noncancerous bone tumors that cannot be removed by surgery. It can also be used to treat high calcium levels in the blood caused by cancer. It works by blocking a protein that causes bones to break down quickly. This slows down the release of calcium from bones, which lowers calcium levels in your blood. It also makes your bones stronger and less likely to break (fracture). This medicine may be used for other purposes; ask your health care provider or pharmacist if you have questions. COMMON BRAND NAME(S): XGEVA What should I tell my care team before I take this medication? They need to know if you have any of these  conditions: Dental disease Having surgery or tooth extraction Infection Kidney disease Low levels of calcium or vitamin D in the blood Malnutrition On hemodialysis Skin conditions or sensitivity Thyroid or parathyroid disease An unusual reaction to denosumab, other medications, foods, dyes, or preservatives Pregnant or trying to get pregnant Breast-feeding How should I use this medication? This medication is for injection under the skin. It is given by your care team in a hospital or clinic setting. A special MedGuide will be given to you before each treatment. Be sure to read this information carefully each time. Talk to your care team about the use of this medication in children. While it may be prescribed for children as young as 13 years for selected conditions, precautions do apply. Overdosage: If you think you have taken too much of this medicine contact a poison control center or emergency room at once. NOTE: This medicine is only for you. Do not share this medicine with others. What if I miss a dose? Keep appointments for follow-up doses. It is important not to miss your dose. Call your care team if you are unable to keep an appointment. What may interact with this medication? Do not take this medication with any of the following: Other medications containing denosumab This medication may also interact with the following: Medications that lower your chance of fighting infection Steroid medications, such as prednisone or cortisone This list may not describe all possible interactions. Give your health care provider a list of all the medicines,  herbs, non-prescription drugs, or dietary supplements you use. Also tell them if you smoke, drink alcohol, or use illegal drugs. Some items may interact with your medicine. What should I watch for while using this medication? Your condition will be monitored carefully while you are receiving this medication. You may need blood work while  taking this medication. This medication may increase your risk of getting an infection. Call your care team for advice if you get a fever, chills, sore throat, or other symptoms of a cold or flu. Do not treat yourself. Try to avoid being around people who are sick. You should make sure you get enough calcium and vitamin D while you are taking this medication, unless your care team tells you not to. Discuss the foods you eat and the vitamins you take with your care team. Some people who take this medication have severe bone, joint, or muscle pain. This medication may also increase your risk for jaw problems or a broken thigh bone. Tell your care team right away if you have severe pain in your jaw, bones, joints, or muscles. Tell your care team if you have any pain that does not go away or that gets worse. Talk to your care team if you may be pregnant. Serious birth defects can occur if you take this medication during pregnancy and for 5 months after the last dose. You will need a negative pregnancy test before starting this medication. Contraception is recommended while taking this medication and for 5 months after the last dose. Your care team can help you find the option that works for you. What side effects may I notice from receiving this medication? Side effects that you should report to your care team as soon as possible: Allergic reactions--skin rash, itching, hives, swelling of the face, lips, tongue, or throat Bone, joint, or muscle pain Low calcium level--muscle pain or cramps, confusion, tingling, or numbness in the hands or feet Osteonecrosis of the jaw--pain, swelling, or redness in the mouth, numbness of the jaw, poor healing after dental work, unusual discharge from the mouth, visible bones in the mouth Side effects that usually do not require medical attention (report to your care team if they continue or are bothersome): Cough Diarrhea Fatigue Headache Nausea This list may not  describe all possible side effects. Call your doctor for medical advice about side effects. You may report side effects to FDA at 1-800-FDA-1088. Where should I keep my medication? This medication is given in a hospital or clinic. It will not be stored at home. NOTE: This sheet is a summary. It may not cover all possible information. If you have questions about this medicine, talk to your doctor, pharmacist, or health care provider.  2024 Elsevier/Gold Standard (2022-04-03 00:00:00)      To help prevent nausea and vomiting after your treatment, we encourage you to take your nausea medication as directed.  BELOW ARE SYMPTOMS THAT SHOULD BE REPORTED IMMEDIATELY: *FEVER GREATER THAN 100.4 F (38 C) OR HIGHER *CHILLS OR SWEATING *NAUSEA AND VOMITING THAT IS NOT CONTROLLED WITH YOUR NAUSEA MEDICATION *UNUSUAL SHORTNESS OF BREATH *UNUSUAL BRUISING OR BLEEDING *URINARY PROBLEMS (pain or burning when urinating, or frequent urination) *BOWEL PROBLEMS (unusual diarrhea, constipation, pain near the anus) TENDERNESS IN MOUTH AND THROAT WITH OR WITHOUT PRESENCE OF ULCERS (sore throat, sores in mouth, or a toothache) UNUSUAL RASH, SWELLING OR PAIN  UNUSUAL VAGINAL DISCHARGE OR ITCHING   Items with * indicate a potential emergency and should be followed up as soon  as possible or go to the Emergency Department if any problems should occur.  Please show the CHEMOTHERAPY ALERT CARD or IMMUNOTHERAPY ALERT CARD at check-in to the Emergency Department and triage nurse.  Should you have questions after your visit or need to cancel or reschedule your appointment, please contact [facility] [place] - A DEPT OF [facility] [phone]  and follow the prompts.  Office hours are 8:00 a.m. to 4:30 p.m. Monday - Friday. Please note that voicemails left after 4:00 p.m. may not be returned until the following business day.  We are closed weekends and major holidays. You have access to a nurse at  all times for urgent questions. Please call the main number to the clinic [phone] and follow the prompts.  For any non-urgent questions, you may also contact your provider using MyChart. We now offer e-Visits for anyone 2 and older to request care online for non-urgent symptoms. For details visit [email or web address].   Also download the MyChart app! Go to the app store, search "MyChart", open the app, select [place], and log in with your MyChart username and password.

## 2023-12-23 ENCOUNTER — Other Ambulatory Visit: Payer: Self-pay | Admitting: Pharmacist

## 2023-12-23 ENCOUNTER — Other Ambulatory Visit (HOSPITAL_COMMUNITY): Payer: Self-pay

## 2023-12-23 ENCOUNTER — Other Ambulatory Visit: Payer: Self-pay

## 2023-12-23 ENCOUNTER — Encounter (HOSPITAL_COMMUNITY): Payer: Self-pay | Admitting: Hematology

## 2023-12-23 ENCOUNTER — Telehealth: Payer: Self-pay

## 2023-12-23 DIAGNOSIS — C50212 Malignant neoplasm of upper-inner quadrant of left female breast: Secondary | ICD-10-CM

## 2023-12-23 MED ORDER — PALBOCICLIB 75 MG PO TABS
75.0000 mg | ORAL_TABLET | Freq: Every day | ORAL | 0 refills | Status: DC
  Filled 2023-12-23 – 2024-01-02 (×2): qty 21, 28d supply, fill #0

## 2023-12-23 NOTE — Progress Notes (Signed)
Patient switching from PAP to filling at [facility] (Specialty)

## 2023-12-23 NOTE — Telephone Encounter (Signed)
Oral Oncology Patient Advocate Encounter  New authorization   Received notification that prior authorization for [name] is required.   PA submitted on 12/23/23  Key [id number]  Status is pending     Ardeen Fillers, CPhT Oncology Pharmacy Patient Advocate  [facility]  [phone] (phone) [phone] (fax) 12/23/2023 11:28 AM

## 2023-12-23 NOTE — Progress Notes (Signed)
Specialty Pharmacy Initial Fill Coordination Note  Kara Woods is a 89 y.o. female contacted today regarding initial fill of specialty medication(s) Palbociclib [name])  Patient requested Delivery   Delivery date: 12/25/23   Verified address: [address] [place], [place] [address]  Medication will be filled on 12/24/23.   Patient is aware of $0.00 copayment. [facility] Secondary.    Ardeen Fillers, CPhT Oncology Pharmacy Patient Advocate  [facility]  [phone] (phone) [phone] (fax) 12/23/2023 11:27 AM

## 2023-12-23 NOTE — Progress Notes (Signed)
Specialty Pharmacy Initiation Note   Kara Woods is a 89 y.o. female who will be followed by the specialty pharmacy service for RxSp Oncology    Review of administration, indication, effectiveness, safety, potential side effects, storage/disposable, and missed dose instructions occurred today for patient's specialty medication(s) Palbociclib [name])     Patient/Caregiver did not have any additional questions or concerns.   Patient's therapy is appropriate to: Continue    Goals Addressed             This Visit's Progress    Slow Disease Progression       Patient is on track. Patient will maintain adherence        Patient switching from PAP to filling at [facility] (Specialty)  Remi Haggard Specialty Pharmacist

## 2023-12-24 ENCOUNTER — Other Ambulatory Visit: Payer: Self-pay

## 2023-12-24 NOTE — Telephone Encounter (Signed)
Oral Oncology Patient Advocate Encounter  Received notification that the request for prior authorization for [name] has been denied due to "Patient has not tried and failed [place] or [place]zen[place]."  Patient has been on [place] continuously for several years. Appeal will be started.     Ardeen Fillers, CPhT Oncology Pharmacy Patient Advocate  [facility]  [phone] (phone) [phone] (fax) 12/24/2023 9:47 AM

## 2023-12-25 ENCOUNTER — Telehealth: Payer: Self-pay

## 2023-12-25 ENCOUNTER — Encounter (HOSPITAL_COMMUNITY): Payer: Self-pay | Admitting: Hematology

## 2023-12-25 ENCOUNTER — Other Ambulatory Visit: Payer: Self-pay

## 2023-12-25 ENCOUNTER — Other Ambulatory Visit (HOSPITAL_COMMUNITY): Payer: Self-pay

## 2023-12-25 NOTE — Telephone Encounter (Signed)
Received Proof of Income from patient's daughter, Selena Batten. Proof of Income has been e-faxed to [facility] at [phone]. I will continue to follow until funds have been approved and released for patient.    Ardeen Fillers, CPhT Oncology Pharmacy Patient Advocate  [facility]  [phone] (phone) [phone] (fax) 12/25/2023 10:27 AM

## 2023-12-25 NOTE — Telephone Encounter (Signed)
Oral Oncology Patient Advocate Encounter  Prior Authorization for [name] has been approved.    PA# [id number]  Effective dates: 11/26/23 through 12/23/24  Patients co-pay is $1,915.02.   [facility] Grant obtained for $0.00 co-pay.   Ardeen Fillers, CPhT Oncology Pharmacy Patient Advocate  [facility]  [phone] (phone) [phone] (fax) 12/25/2023 9:22 AM

## 2023-12-25 NOTE — Telephone Encounter (Signed)
Oral Oncology Patient Advocate Encounter   An urgent appeal for the prior authorization denial of Kara Woods has been started by the pharmacist on 12/24/23.   This encounter will continue to be updated until final appeal determination.      Ardeen Fillers, CPhT Oncology Pharmacy Patient Advocate  [facility]  [phone] (phone) [phone] (fax) 12/25/2023 9:22 AM

## 2023-12-25 NOTE — Telephone Encounter (Signed)
Received notification from [facility] that [facility] was not processing. I called [facility] and was informed that after approval of grant they requested Proof of Income from patient. Patient has not received communication regarding this, nor has the MD Office. Patient's daughter, Selena Batten, is going to send me Proof of Income for patient to submit for final release of grant funds. I will continue to follow and update until completed.    Ardeen Fillers, CPhT Oncology Pharmacy Patient Advocate  [facility]  [phone] (phone) [phone] (fax) 12/25/2023 9:26 AM

## 2023-12-26 ENCOUNTER — Other Ambulatory Visit: Payer: Self-pay

## 2023-12-29 ENCOUNTER — Other Ambulatory Visit: Payer: Self-pay

## 2023-12-30 ENCOUNTER — Other Ambulatory Visit: Payer: Self-pay

## 2024-01-01 ENCOUNTER — Other Ambulatory Visit (HOSPITAL_COMMUNITY): Payer: Self-pay

## 2024-01-02 ENCOUNTER — Other Ambulatory Visit: Payer: Self-pay

## 2024-01-02 NOTE — Telephone Encounter (Signed)
 Received notification that funds had been added to patient's grant. [facility] was able to re-process script and receive paid claim of $0.00. Patient is aware.    Morene Potters, CPhT Oncology Pharmacy Patient Advocate  [facility]  [phone] (phone) [phone] (fax) 01/02/2024 2:38 PM

## 2024-01-12 ENCOUNTER — Encounter: Payer: Self-pay | Admitting: Neurology

## 2024-01-12 ENCOUNTER — Ambulatory Visit (INDEPENDENT_AMBULATORY_CARE_PROVIDER_SITE_OTHER): Payer: Medicare Other | Admitting: Neurology

## 2024-01-12 VITALS — BP 124/82 | HR 86 | Ht 66.0 in | Wt 155.0 lb

## 2024-01-12 DIAGNOSIS — R441 Visual hallucinations: Secondary | ICD-10-CM | POA: Diagnosis not present

## 2024-01-12 MED ORDER — OLANZAPINE 2.5 MG PO TABS: 2.5000 mg | ORAL_TABLET | Freq: Two times a day (BID) | ORAL | 4 refills | Status: AC

## 2024-01-12 NOTE — Patient Instructions (Signed)
Continue with Zyprexa, refill given Continue your other medications Continue follow-up PCP Return in 1 year or sooner if worse.

## 2024-01-12 NOTE — Progress Notes (Signed)
[facility]  PATIENT: Kara Woods DOB: [birth year]  REQUESTING CLINICIAN: Benita Stabile, MD HISTORY FROM: Patient and daughter  REASON FOR VISIT: New onset Hallucinations    HISTORICAL  CHIEF COMPLAINT:  Chief Complaint  Patient presents with   Follow-up    [name], here with son Terri  Pt is here following up on visual hallucinations.    INTERVAL HISTORY 01/12/2024:  Patient presents today for follow-up, last visit was a year ago.  She is accompanied by [name].  Tells me she is doing fine, on occasion she will have hallucinations but again these do not bother her.  Currently no complaint, and no concerns.  She is compliant with the medication no other side effects.   INTERVAL HISTORY 01/08/2023:  Patient presents today for follow-up, she is accompanied by her daughter.  At last visit I did start her on Zyprexa.  Daughter reports that Zyprexa has helped decrease the hallucinations.  Her sleep has also improved.  Patient reports she still continue to see the two little girls.  The last episode was yesterday.  They stayed a a few-minutes and the left.  She reported that these hallucinations are not bothering her.  She denies any other associated symptoms and no other complaints.  Reported that her sleep is fine.   HISTORY OF PRESENT ILLNESS:  This is a 89 year old woman past medical history of hypertension, anxiety/depression, insomnia, recurrent UTI and left eye blindness who is presenting with daughter with complaint of recurrent hallucinations.  Daughter has reported that the hallucinations have been going on for the past 8 months.  Initially it was attributed to UTI as patient is having recurrent UTIs but the frequency of the hallucinations have increased.  Patient reports seeing people in her house, 4 weeks ago, she had an episode when she was seen water coming out of every wall of her house, and underneath her there was a black sand.   She also reports  seeing people in the house. Patient tells me a story about couple weeks ago her phone rang someone call her and told her that "I do know you have cancer and we are here to help you" then they hang then few minutes later 5 people came to the house, they did not say a word but there was just looking around she asked questions but they were not responding. Since then she has been seeing mainly at night and describes tham as a lady with 2 adult children and 2 small children (girls) coming in the house almost every night.  Again she is reporting these people do not respond to her, they do not talk to her, unless on one occasion when she heard the mother talking to one of the sons.  She reports that these events are upsetting to her, she does not want to see them or hear them because they are bothersome to her. They come to her house, come to her bedroom, come to the bathroom, and since then she has difficulty falling asleep.  She is taking clorazepate to help with her sleep.  She has a history of breast cancer with bone mets, her most recent MRI brain did not show any Brain mets. Daughter reports that patient is very independent, lives alone but all her children live about a mile away. Daughter are handling her finances and also helping with her bills.    OTHER MEDICAL CONDITIONS: Hypertension, Anxiety/Depression, Insomnia, recurrent UTI, L eye blindness, Breast cancer  with bone mets   REVIEW OF SYSTEMS: Full 14 system review of systems performed and negative with exception of: As noted in the  HPI  ALLERGIES: Allergies  Allergen Reactions   Contrast Media [Iodinated Contrast Media] Anaphylaxis    Adverse reaction; pt hyperventilating, c/o CP and SOB    Lipitor [Atorvastatin] Other (See Comments)    Muscle aches, cramps, fatigue, weight loss/poor appetite   Sulfamethoxazole Other (See Comments)    Reaction was years ago unknown   Sulfonamide Derivatives Other (See Comments)    Dizziness     HOME  MEDICATIONS: Outpatient Medications Prior to Visit  Medication Sig Dispense Refill   acetaminophen (TYLENOL) 325 MG tablet Take 2 tablets (650 mg total) by mouth every 6 (six) hours as needed for mild pain or headache (or temp > 37.5 C (99.5 F)). 40 tablet 0   anastrozole (ARIMIDEX) 1 MG tablet TAKE 1 TABLET BY MOUTH EVERY DAY 90 tablet 2   aspirin 81 MG EC tablet Take 1 tablet (81 mg total) by mouth daily. Swallow whole. 60 tablet 2   carboxymethylcellulose (REFRESH PLUS) 0.5 % SOLN Place 3-4 drops into both eyes 3 (three) times daily as needed (dry eyes). Preservative free     escitalopram (LEXAPRO) 20 MG tablet TAKE 1 TABLET BY MOUTH EVERY DAY 90 tablet 2   latanoprost (XALATAN) 0.005 % ophthalmic solution Place 1 drop into both eyes at bedtime.     metoprolol tartrate (LOPRESSOR) 25 MG tablet Take 1 tablet (25 mg total) by mouth 2 (two) times daily. 180 tablet 3   nitroGLYCERIN (NITROSTAT) 0.4 MG SL tablet Place 1 tablet (0.4 mg total) under the tongue every 5 (five) minutes as needed. 25 tablet 3   palbociclib (IBRANCE) 75 MG tablet Take 1 tablet (75 mg total) by mouth daily. Take for 21 days on, 7 days off, repeat every 28 days. 21 tablet 0   OLANZapine (ZYPREXA) 2.5 MG tablet TAKE 1 TABLET (2.5 MG TOTAL) BY MOUTH 2 (TWO) TIMES DAILY. AND AN ADDITIONAL TAB IN THE MORNING 180 tablet 4   No facility-administered medications prior to visit.    PAST MEDICAL HISTORY: Past Medical History:  Diagnosis Date   Anxiety    Arthritis    Blind left eye    Breast cancer (HCC)    left    Colitis    Coronary artery disease    a. 10/2016 Staged PCI: RCA 50p, 95m (2.25x12 Resolute Onyx DES), LCX 50p, 12m (2.75x23 Xience Alpine DES). Residual LAD 50p/m, 5m/d.   Cystocele 10/11/2013   Depression    Diastolic dysfunction    a. 09/2016 Echo: EF 50%, diffuse hypokinesis, mild LVH, grade 1 diastolic dysfunction, mild mitral regurgitation, mildly dilated left atrium.   GERD (gastroesophageal reflux  disease)    occasional Tums only   HOH (hard of hearing)    Pelvic relaxation 10/11/2013   Proctitis    colonsocopy 2007, Canasa suppositories   S/P endoscopy March 2009   mild erosive reflux esophagitis, Schatzki's ring, s/p dilation   Schatzki's ring    Shortness of breath    occ if anxiety   Wears dentures    upper   Wears glasses     PAST SURGICAL HISTORY: Past Surgical History:  Procedure Laterality Date   ABDOMINAL HYSTERECTOMY     ANTERIOR AND POSTERIOR REPAIR N/A 05/17/2014   Procedure: ANTERIOR (CYSTOCELE) AND POSTERIOR REPAIR (RECTOCELE);  Surgeon: Martina Sinner, MD;  Location: [facility];  Service: Urology;  Laterality: N/A;  APPENDECTOMY     BREAST LUMPECTOMY WITH NEEDLE LOCALIZATION AND AXILLARY SENTINEL LYMPH NODE BX Left 05/03/2013   Procedure: BREAST LUMPECTOMY WITH NEEDLE LOCALIZATION AND AXILLARY SENTINEL LYMPH NODE BX;  Surgeon: Mariella Saa, MD;  Location: [facility];  Service: General;  Laterality: Left;   BREAST SURGERY Left 05/19/13   CARDIAC CATHETERIZATION  1998   CARDIAC CATHETERIZATION N/A 11/04/2016   Procedure: Left Heart Cath and Coronary Angiography;  Surgeon: Lennette Bihari, MD;  Location: [facility];  Service: Cardiovascular;  Laterality: N/A;   CARDIAC CATHETERIZATION N/A 11/05/2016   Procedure: Coronary Stent Intervention;  Surgeon: Lennette Bihari, MD;  Location: [facility];  Service: Cardiovascular;  Laterality: N/A;   CHOLECYSTECTOMY     COLONOSCOPY  05/06/2006   Diffuse inflammatory changes of the rectal mucosa, consistent  with proctitis.  Otherwise, normal colon to terminal ileum   CORONARY ANGIOPLASTY  11/04/2016   CORONARY STENT PLACEMENT     Drug-eluting coronary artery stent, non-bioabsorbable-polymer-coated   CYSTOSCOPY N/A 05/17/2014   Procedure: CYSTOSCOPY;  Surgeon: Martina Sinner, MD;  Location: [facility];  Service: Urology;  Laterality: N/A;   ESOPHAGOGASTRODUODENOSCOPY  02/09/2008   Schatzki  ring status post dilation/Distal esophageal erosion consistent with mild erosive reflux  esophagitis, otherwise unremarkable esophagus, normal stomach, D1, D2.   EYE SURGERY     lt cataract-implant   EYE SURGERY     lt-lens repaced,l   LUMBAR DISC SURGERY  1993   RE-EXCISION OF BREAST LUMPECTOMY Left 05/19/2013   Procedure: RE-EXCISION OF BREAST LUMPECTOMY;  Surgeon: Mariella Saa, MD;  Location:  SURGERY CENTER;  Service: General;  Laterality: Left;   RETINAL DETACHMENT SURGERY  1978   Left   VAGINAL HYSTERECTOMY Bilateral 05/17/2014   Procedure: HYSTERECTOMY VAGINAL with Bilateral Salpingo-Oophorectomy; Bladder cystotomy repair;  Surgeon: Serita Kyle, MD;  Location: [facility];  Service: Gynecology;  Laterality: Bilateral;   VAGINAL PROLAPSE REPAIR N/A 05/17/2014   Procedure: VAGINAL VAULT PROLAPSE AND GRAFT;  Surgeon: Martina Sinner, MD;  Location: [facility];  Service: Urology;  Laterality: N/A;    FAMILY HISTORY: Family History  Problem Relation Age of Onset   Cancer Brother        spinal   Heart attack Mother    Heart attack Father    Heart attack Son    Heart attack Son    Depression Son    Multiple myeloma Daughter    Anemia Daughter    Colon cancer Neg Hx     SOCIAL HISTORY: Social History   Socioeconomic History   Marital status: Widowed    Spouse name: Not on file   Number of children: 6   Years of education: Not on file   Highest education level: Not on file  Occupational History   Occupation: Retired  Tobacco Use   Smoking status: Former    Current packs/day: 0.00    Types: Cigarettes    Quit date: 04/28/1990    Years since quitting: 33.7   Smokeless tobacco: Never   Tobacco comments:    quit 28 yrs ago  Substance and Sexual Activity   Alcohol use: Yes    Alcohol/week: 1.0 standard drink of alcohol    Types: 1 Glasses of wine per week    Comment: occ   Drug use: No   Sexual activity: Not Currently    Birth control/protection:  Post-menopausal  Other Topics Concern   Not on file  Social History  Narrative    02/14/23 lives at home by herself and is independent of all activities.   all her children live about a mile away. Daughter are handling her finances and also helping with her bills.    -Daughter who has multiple myeloma at age 70   Social Drivers of Health   Financial Resource Strain: Low Risk  (10/07/2022)   Overall Financial Resource Strain (CARDIA)    Difficulty of Paying Living Expenses: Not hard at all  Food Insecurity: No Food Insecurity (10/07/2022)   Hunger Vital Sign    Worried About Running Out of Food in the Last Year: Never true    Ran Out of Food in the Last Year: Never true  Transportation Needs: No Transportation Needs (10/07/2022)   PRAPARE - Administrator, Civil Service (Medical): No    Lack of Transportation (Non-Medical): No  Physical Activity: Insufficiently Active (09/27/2020)   Exercise Vital Sign    Days of Exercise per Week: 2 days    Minutes of Exercise per Session: 10 min  Stress: No Stress Concern Present (01/09/2023)   [employer] of Occupational Health - Occupational Stress Questionnaire    Feeling of Stress : Only a little  Social Connections: Socially Isolated (09/27/2020)   Social Connection and Isolation Panel [NHANES]    Frequency of Communication with Friends and Family: More than three times a week    Frequency of Social Gatherings with Friends and Family: More than three times a week    Attends Religious Services: Never    Database administrator or Organizations: No    Attends Banker Meetings: Never    Marital Status: Widowed  Intimate Partner Violence: Not At Risk (01/09/2023)   Humiliation, Afraid, Rape, and Kick questionnaire    Fear of Current or Ex-Partner: No    Emotionally Abused: No    Physically Abused: No    Sexually Abused: No    PHYSICAL EXAM  GENERAL EXAM/CONSTITUTIONAL: Vitals:  Vitals:   01/12/24 1549  BP:  124/82  Pulse: 86  Weight: 155 lb (70.3 kg)  Height: 5\' 6"  (1.676 m)   Body mass index is 25.02 kg/m. Wt Readings from Last 3 Encounters:  01/12/24 155 lb (70.3 kg)  11/18/23 157 lb 6.4 oz (71.4 kg)  08/26/23 156 lb 9.6 oz (71 kg)   Patient is in no distress; well developed, nourished and groomed; neck is supple   MUSCULOSKELETAL: Gait, strength, tone, movements noted in Neurologic exam below  NEUROLOGIC: MENTAL STATUS:      No data to display         awake, alert, oriented to person, place and time Registration intact but unable to recall 3 items after 5 minutes.  Reports  7 quarters in $1.75, unable to spell EMPTY backward. Knows the last 2 [place] presidents language fluent, comprehension intact, naming intact fund of knowledge appropriate  CRANIAL NERVE:  2nd, 3rd, 4th, 6th -visual fields full to confrontation, extraocular muscles intact, no nystagmus. Blind in the left eye  5th - facial sensation symmetric 7th - facial strength symmetric 8th - hearing intact 9th - palate elevates symmetrically, uvula midline 11th - shoulder shrug symmetric 12th - tongue protrusion midline  MOTOR:  normal bulk and tone, full strength in the BUE, BLE  SENSORY:  normal and symmetric to light touch  COORDINATION:  finger-nose-finger, fine finger movements normal, noted to have a chin tremor  GAIT/STATION:  normal   DIAGNOSTIC DATA (LABS, IMAGING, TESTING) - I  reviewed patient records, labs, notes, testing and imaging myself where available.  Lab Results  Component Value Date   WBC 5.0 11/11/2023   HGB 12.2 11/11/2023   HCT 36.5 11/11/2023   MCV 108.3 (H) 11/11/2023   PLT 179 11/11/2023      Component Value Date/Time   NA 137 12/17/2023 1401   K 4.1 12/17/2023 1401   CL 103 12/17/2023 1401   CO2 26 12/17/2023 1401   GLUCOSE 110 (H) 12/17/2023 1401   BUN 16 12/17/2023 1401   CREATININE 1.02 (H) 12/17/2023 1401   CREATININE 0.72 11/11/2016 1208   CALCIUM 9.0  12/17/2023 1401   PROT 6.8 12/17/2023 1401   ALBUMIN 3.8 12/17/2023 1401   AST 15 12/17/2023 1401   ALT 8 12/17/2023 1401   ALKPHOS 37 (L) 12/17/2023 1401   BILITOT 0.7 12/17/2023 1401   GFRNONAA 53 (L) 12/17/2023 1401   GFRAA >60 08/28/2020 1449   Lab Results  Component Value Date   CHOL 162 10/20/2020   HDL 46 10/20/2020   LDLCALC 97 10/20/2020   TRIG 97 10/20/2020   CHOLHDL 3.5 10/20/2020   Lab Results  Component Value Date   HGBA1C 5.4 10/20/2020   Lab Results  Component Value Date   VITAMINB12 557 10/19/2020   Lab Results  Component Value Date   TSH 1.339 10/19/2020    MRI Brain 08/20/22 No evidence of acute intracranial abnormality or metastatic disease    Routine EEG 10/03/22 This is a normal EEG recorded while drowsy and awake. No evidence of interictal epileptiform discharges. Normal EEGs, however, do not rule out epilepsy.    ASSESSMENT AND PLAN  89 y.o. year old female with history of breast cancer with mets to bone, hypertension, anxiety/depression, insomnia, recurrent UTI and left eye blindness who is presenting for follow up for recurrent hallucinations.  The frequency of the hallucinations decreased since being on Zyprexa but they are still present.  They do not disturb patient.  Etiology remains unclear, repeat MRI Brain did not show any metastatic disease and her EEG was normal.  Will continue to monitor these hallucinations and they understand to contact me if they get worse.  I will see her in 1 year or sooner if worse.  Continue to follow with PCP   1. Hallucination, visual     Patient Instructions  Continue with Zyprexa, refill given Continue your other medications Continue follow-up PCP Return in 1 year or sooner if worse.  No orders of the defined types were placed in this encounter.   Meds ordered this encounter  Medications   OLANZapine (ZYPREXA) 2.5 MG tablet    Sig: Take 1 tablet (2.5 mg total) by mouth 2 (two) times daily. And an  additional tab in the morning    Dispense:  180 tablet    Refill:  4    Return in about 1 year (around 01/11/2025).    Windell Norfolk, MD 01/12/2024, 4:18 PM  [facility] [address] [place], [place] [address] [phone]

## 2024-01-13 ENCOUNTER — Inpatient Hospital Stay: Payer: Medicare Other | Attending: Hematology

## 2024-01-13 ENCOUNTER — Inpatient Hospital Stay: Payer: Medicare Other

## 2024-01-13 VITALS — BP 126/77 | HR 57 | Temp 97.6°F | Resp 17

## 2024-01-13 DIAGNOSIS — Z79811 Long term (current) use of aromatase inhibitors: Secondary | ICD-10-CM | POA: Insufficient documentation

## 2024-01-13 DIAGNOSIS — Z170 Estrogen receptor positive status [ER+]: Secondary | ICD-10-CM

## 2024-01-13 DIAGNOSIS — Z807 Family history of other malignant neoplasms of lymphoid, hematopoietic and related tissues: Secondary | ICD-10-CM | POA: Diagnosis not present

## 2024-01-13 DIAGNOSIS — C50212 Malignant neoplasm of upper-inner quadrant of left female breast: Secondary | ICD-10-CM | POA: Insufficient documentation

## 2024-01-13 DIAGNOSIS — Z923 Personal history of irradiation: Secondary | ICD-10-CM | POA: Insufficient documentation

## 2024-01-13 DIAGNOSIS — R443 Hallucinations, unspecified: Secondary | ICD-10-CM | POA: Diagnosis not present

## 2024-01-13 DIAGNOSIS — Z79899 Other long term (current) drug therapy: Secondary | ICD-10-CM | POA: Insufficient documentation

## 2024-01-13 DIAGNOSIS — F32A Depression, unspecified: Secondary | ICD-10-CM | POA: Diagnosis not present

## 2024-01-13 DIAGNOSIS — R197 Diarrhea, unspecified: Secondary | ICD-10-CM | POA: Diagnosis not present

## 2024-01-13 DIAGNOSIS — C7951 Secondary malignant neoplasm of bone: Secondary | ICD-10-CM | POA: Insufficient documentation

## 2024-01-13 DIAGNOSIS — M25473 Effusion, unspecified ankle: Secondary | ICD-10-CM | POA: Diagnosis not present

## 2024-01-13 DIAGNOSIS — Z87891 Personal history of nicotine dependence: Secondary | ICD-10-CM | POA: Diagnosis not present

## 2024-01-13 DIAGNOSIS — Z853 Personal history of malignant neoplasm of breast: Secondary | ICD-10-CM

## 2024-01-13 DIAGNOSIS — F419 Anxiety disorder, unspecified: Secondary | ICD-10-CM | POA: Diagnosis not present

## 2024-01-13 LAB — COMPREHENSIVE METABOLIC PANEL
ALT: 9 U/L (ref 0–44)
AST: 15 U/L (ref 15–41)
Albumin: 3.8 g/dL (ref 3.5–5.0)
Alkaline Phosphatase: 36 U/L — ABNORMAL LOW (ref 38–126)
Anion gap: 10 (ref 5–15)
BUN: 12 mg/dL (ref 8–23)
CO2: 26 mmol/L (ref 22–32)
Calcium: 8.6 mg/dL — ABNORMAL LOW (ref 8.9–10.3)
Chloride: 104 mmol/L (ref 98–111)
Creatinine, Ser: 0.84 mg/dL (ref 0.44–1.00)
GFR, Estimated: >60 mL/min (ref >60)
Glucose, Bld: 90 mg/dL (ref 70–99)
Potassium: 3.9 mmol/L (ref 3.5–5.1)
Sodium: 140 mmol/L (ref 135–145)
Total Bilirubin: 0.9 mg/dL (ref 0.0–1.2)
Total Protein: 7.3 g/dL (ref 6.5–8.1)

## 2024-01-13 LAB — CBC WITH DIFFERENTIAL/PLATELET
Abs Immature Granulocytes: 0.02 10*3/uL (ref 0.00–0.07)
Basophils Absolute: 0.1 10*3/uL (ref 0.0–0.1)
Basophils Relative: 1 %
Eosinophils Absolute: 0 10*3/uL (ref 0.0–0.5)
Eosinophils Relative: 1 %
HCT: 36.8 % (ref 36.0–46.0)
Hemoglobin: 12.3 g/dL (ref 12.0–15.0)
Immature Granulocytes: 0 %
Lymphocytes Relative: 41 %
Lymphs Abs: 2 10*3/uL (ref 0.7–4.0)
MCH: 35.5 pg — ABNORMAL HIGH (ref 26.0–34.0)
MCHC: 33.4 g/dL (ref 30.0–36.0)
MCV: 106.4 fL — ABNORMAL HIGH (ref 80.0–100.0)
Monocytes Absolute: 0.3 10*3/uL (ref 0.1–1.0)
Monocytes Relative: 6 %
Neutro Abs: 2.5 10*3/uL (ref 1.7–7.7)
Neutrophils Relative %: 51 %
Platelets: 232 10*3/uL (ref 150–400)
RBC: 3.46 MIL/uL — ABNORMAL LOW (ref 3.87–5.11)
RDW: 14.7 % (ref 11.5–15.5)
WBC: 4.9 10*3/uL (ref 4.0–10.5)
nRBC: 0 % (ref 0.0–0.2)

## 2024-01-13 MED ORDER — DENOSUMAB 120 MG/1.7ML ~~LOC~~ SOLN
120.0000 mg | Freq: Once | SUBCUTANEOUS | Status: AC
  Administered 2024-01-13: 120 mg via SUBCUTANEOUS
  Filled 2024-01-13: qty 1.7

## 2024-01-13 NOTE — Patient Instructions (Signed)
 [facility]  - A DEPT OF [facility]  Discharge Instructions: Thank you for choosing [facility] to provide your oncology and hematology care.  If you have a lab appointment with the Cancer Center - please note that after April 8th, 2024, all labs will be drawn in the cancer center.  You do not have to check in or register with the main entrance as you have in the past but will complete your check-in in the cancer center.  Wear comfortable clothing and clothing appropriate for easy access to any Portacath or PICC line.   We strive to give you quality time with your provider. You may need to reschedule your appointment if you arrive late (15 or more minutes).  Arriving late affects you and other patients whose appointments are after yours.  Also, if you miss three or more appointments without notifying the office, you may be dismissed from the clinic at the provider's discretion.      For prescription refill requests, have your pharmacy contact our office and allow 72 hours for refills to be completed.    Today you received the following chemotherapy and/or immunotherapy agents Xgeva Denosumab Injection (Oncology) What is this medication? DENOSUMAB (den oh SUE mab) prevents weakened bones caused by cancer. It may also be used to treat noncancerous bone tumors that cannot be removed by surgery. It can also be used to treat high calcium levels in the blood caused by cancer. It works by blocking a protein that causes bones to break down quickly. This slows down the release of calcium from bones, which lowers calcium levels in your blood. It also makes your bones stronger and less likely to break (fracture). This medicine may be used for other purposes; ask your health care provider or pharmacist if you have questions. COMMON BRAND NAME(S): XGEVA What should I tell my care team before I take this medication? They need to know if you have any of these  conditions: Dental disease Having surgery or tooth extraction Infection Kidney disease Low levels of calcium or vitamin D in the blood Malnutrition On hemodialysis Skin conditions or sensitivity Thyroid or parathyroid disease An unusual reaction to denosumab, other medications, foods, dyes, or preservatives Pregnant or trying to get pregnant Breast-feeding How should I use this medication? This medication is for injection under the skin. It is given by your care team in a hospital or clinic setting. A special MedGuide will be given to you before each treatment. Be sure to read this information carefully each time. Talk to your care team about the use of this medication in children. While it may be prescribed for children as young as 13 years for selected conditions, precautions do apply. Overdosage: If you think you have taken too much of this medicine contact a poison control center or emergency room at once. NOTE: This medicine is only for you. Do not share this medicine with others. What if I miss a dose? Keep appointments for follow-up doses. It is important not to miss your dose. Call your care team if you are unable to keep an appointment. What may interact with this medication? Do not take this medication with any of the following: Other medications containing denosumab This medication may also interact with the following: Medications that lower your chance of fighting infection Steroid medications, such as prednisone or cortisone This list may not describe all possible interactions. Give your health care provider a list of all the medicines,  herbs, non-prescription drugs, or dietary supplements you use. Also tell them if you smoke, drink alcohol, or use illegal drugs. Some items may interact with your medicine. What should I watch for while using this medication? Your condition will be monitored carefully while you are receiving this medication. You may need blood work while  taking this medication. This medication may increase your risk of getting an infection. Call your care team for advice if you get a fever, chills, sore throat, or other symptoms of a cold or flu. Do not treat yourself. Try to avoid being around people who are sick. You should make sure you get enough calcium and vitamin D while you are taking this medication, unless your care team tells you not to. Discuss the foods you eat and the vitamins you take with your care team. Some people who take this medication have severe bone, joint, or muscle pain. This medication may also increase your risk for jaw problems or a broken thigh bone. Tell your care team right away if you have severe pain in your jaw, bones, joints, or muscles. Tell your care team if you have any pain that does not go away or that gets worse. Talk to your care team if you may be pregnant. Serious birth defects can occur if you take this medication during pregnancy and for 5 months after the last dose. You will need a negative pregnancy test before starting this medication. Contraception is recommended while taking this medication and for 5 months after the last dose. Your care team can help you find the option that works for you. What side effects may I notice from receiving this medication? Side effects that you should report to your care team as soon as possible: Allergic reactions--skin rash, itching, hives, swelling of the face, lips, tongue, or throat Bone, joint, or muscle pain Low calcium level--muscle pain or cramps, confusion, tingling, or numbness in the hands or feet Osteonecrosis of the jaw--pain, swelling, or redness in the mouth, numbness of the jaw, poor healing after dental work, unusual discharge from the mouth, visible bones in the mouth Side effects that usually do not require medical attention (report to your care team if they continue or are bothersome): Cough Diarrhea Fatigue Headache Nausea This list may not  describe all possible side effects. Call your doctor for medical advice about side effects. You may report side effects to FDA at 1-800-FDA-1088. Where should I keep my medication? This medication is given in a hospital or clinic. It will not be stored at home. NOTE: This sheet is a summary. It may not cover all possible information. If you have questions about this medicine, talk to your doctor, pharmacist, or health care provider.  2024 Elsevier/Gold Standard (2022-04-03 00:00:00)      To help prevent nausea and vomiting after your treatment, we encourage you to take your nausea medication as directed.  BELOW ARE SYMPTOMS THAT SHOULD BE REPORTED IMMEDIATELY: *FEVER GREATER THAN 100.4 F (38 C) OR HIGHER *CHILLS OR SWEATING *NAUSEA AND VOMITING THAT IS NOT CONTROLLED WITH YOUR NAUSEA MEDICATION *UNUSUAL SHORTNESS OF BREATH *UNUSUAL BRUISING OR BLEEDING *URINARY PROBLEMS (pain or burning when urinating, or frequent urination) *BOWEL PROBLEMS (unusual diarrhea, constipation, pain near the anus) TENDERNESS IN MOUTH AND THROAT WITH OR WITHOUT PRESENCE OF ULCERS (sore throat, sores in mouth, or a toothache) UNUSUAL RASH, SWELLING OR PAIN  UNUSUAL VAGINAL DISCHARGE OR ITCHING   Items with * indicate a potential emergency and should be followed up as soon  as possible or go to the Emergency Department if any problems should occur.  Please show the CHEMOTHERAPY ALERT CARD or IMMUNOTHERAPY ALERT CARD at check-in to the Emergency Department and triage nurse.  Should you have questions after your visit or need to cancel or reschedule your appointment, please contact [facility] [place] - A DEPT OF [facility] [phone]  and follow the prompts.  Office hours are 8:00 a.m. to 4:30 p.m. Monday - Friday. Please note that voicemails left after 4:00 p.m. may not be returned until the following business day.  We are closed weekends and major holidays. You have access to a nurse at  all times for urgent questions. Please call the main number to the clinic [phone] and follow the prompts.  For any non-urgent questions, you may also contact your provider using MyChart. We now offer e-Visits for anyone 2 and older to request care online for non-urgent symptoms. For details visit [email or web address].   Also download the MyChart app! Go to the app store, search "MyChart", open the app, select [place], and log in with your MyChart username and password.

## 2024-01-13 NOTE — Progress Notes (Signed)
Kara Woods presents today for injection per the provider's orders. Creatinine 0.84. Calcium 8.6. Patient states she is taking Calcium supplements daily. Patient denies any jaw pain or upcoming major dental surgery.    XGeva administration without incident; injection site WNL; see MAR for injection details.  Patient tolerated procedure well and without incident.  No questions or complaints noted at this time. Discharged from clinic ambulatory in stable condition. Alert and oriented x 3. F/U with [facility] as scheduled.

## 2024-01-14 LAB — CANCER ANTIGEN 27.29: CA 27.29: 32.1 U/mL (ref 0.0–38.6)

## 2024-01-14 LAB — CANCER ANTIGEN 15-3: CA 15-3: 27.1 U/mL — ABNORMAL HIGH (ref 0.0–25.0)

## 2024-01-21 ENCOUNTER — Other Ambulatory Visit: Payer: Self-pay

## 2024-01-21 ENCOUNTER — Other Ambulatory Visit: Payer: Self-pay | Admitting: Hematology

## 2024-01-21 DIAGNOSIS — C50212 Malignant neoplasm of upper-inner quadrant of left female breast: Secondary | ICD-10-CM

## 2024-01-21 MED ORDER — PALBOCICLIB 75 MG PO TABS
75.0000 mg | ORAL_TABLET | Freq: Every day | ORAL | 0 refills | Status: DC
  Filled 2024-01-21: qty 21, 28d supply, fill #0

## 2024-01-21 NOTE — Progress Notes (Signed)
 Specialty Pharmacy Refill Coordination Note  Kara Woods is a 89 y.o. female contacted today regarding refills of specialty medication(s) Palbociclib [name]) Spoke with patient's daughter  Patient requested Delivery   Delivery date: 01/23/24   Verified address: [address] [place], [place] [address]   Medication will be filled on 02.27.25.

## 2024-01-21 NOTE — Progress Notes (Signed)
 Specialty Pharmacy Ongoing Clinical Assessment Note  Kara Woods is a 89 y.o. female who is being followed by the specialty pharmacy service for RxSp Oncology   Patient's specialty medication(s) reviewed today: Palbociclib [name])   Missed doses in the last 4 weeks: 0   Patient/Caregiver did not have any additional questions or concerns.   Therapeutic benefit summary: Patient is achieving benefit   Adverse events/side effects summary: No adverse events/side effects   Patient's therapy is appropriate to: Continue    Goals Addressed             This Visit's Progress    Slow Disease Progression   On track    Patient is on track. Patient will maintain adherence         Follow up:  6 months  Otto Herb Specialty Pharmacist

## 2024-01-22 ENCOUNTER — Other Ambulatory Visit: Payer: Self-pay

## 2024-01-25 ENCOUNTER — Other Ambulatory Visit: Payer: Self-pay | Admitting: Hematology

## 2024-01-25 DIAGNOSIS — Z170 Estrogen receptor positive status [ER+]: Secondary | ICD-10-CM

## 2024-01-25 DIAGNOSIS — C50919 Malignant neoplasm of unspecified site of unspecified female breast: Secondary | ICD-10-CM

## 2024-01-26 ENCOUNTER — Encounter (HOSPITAL_COMMUNITY): Payer: Self-pay | Admitting: Hematology

## 2024-02-01 ENCOUNTER — Other Ambulatory Visit: Payer: Self-pay | Admitting: Hematology

## 2024-02-02 ENCOUNTER — Encounter (HOSPITAL_COMMUNITY): Payer: Self-pay | Admitting: Hematology

## 2024-02-09 ENCOUNTER — Other Ambulatory Visit: Payer: Self-pay

## 2024-02-09 DIAGNOSIS — Z853 Personal history of malignant neoplasm of breast: Secondary | ICD-10-CM

## 2024-02-09 DIAGNOSIS — C50212 Malignant neoplasm of upper-inner quadrant of left female breast: Secondary | ICD-10-CM

## 2024-02-10 ENCOUNTER — Inpatient Hospital Stay: Payer: Medicare Other

## 2024-02-10 ENCOUNTER — Inpatient Hospital Stay: Payer: Medicare Other | Attending: Hematology

## 2024-02-10 VITALS — BP 121/70 | HR 70 | Temp 97.7°F | Resp 18

## 2024-02-10 DIAGNOSIS — F32A Depression, unspecified: Secondary | ICD-10-CM | POA: Insufficient documentation

## 2024-02-10 DIAGNOSIS — Z79811 Long term (current) use of aromatase inhibitors: Secondary | ICD-10-CM | POA: Insufficient documentation

## 2024-02-10 DIAGNOSIS — M25473 Effusion, unspecified ankle: Secondary | ICD-10-CM | POA: Diagnosis not present

## 2024-02-10 DIAGNOSIS — C7951 Secondary malignant neoplasm of bone: Secondary | ICD-10-CM | POA: Diagnosis not present

## 2024-02-10 DIAGNOSIS — Z853 Personal history of malignant neoplasm of breast: Secondary | ICD-10-CM

## 2024-02-10 DIAGNOSIS — Z79899 Other long term (current) drug therapy: Secondary | ICD-10-CM | POA: Diagnosis not present

## 2024-02-10 DIAGNOSIS — C50212 Malignant neoplasm of upper-inner quadrant of left female breast: Secondary | ICD-10-CM | POA: Diagnosis not present

## 2024-02-10 DIAGNOSIS — Z807 Family history of other malignant neoplasms of lymphoid, hematopoietic and related tissues: Secondary | ICD-10-CM | POA: Insufficient documentation

## 2024-02-10 DIAGNOSIS — F419 Anxiety disorder, unspecified: Secondary | ICD-10-CM | POA: Insufficient documentation

## 2024-02-10 DIAGNOSIS — R197 Diarrhea, unspecified: Secondary | ICD-10-CM | POA: Diagnosis not present

## 2024-02-10 DIAGNOSIS — Z87891 Personal history of nicotine dependence: Secondary | ICD-10-CM | POA: Diagnosis not present

## 2024-02-10 DIAGNOSIS — Z923 Personal history of irradiation: Secondary | ICD-10-CM | POA: Diagnosis not present

## 2024-02-10 DIAGNOSIS — R443 Hallucinations, unspecified: Secondary | ICD-10-CM | POA: Diagnosis not present

## 2024-02-10 LAB — CBC WITH DIFFERENTIAL/PLATELET
Abs Immature Granulocytes: 0.02 10*3/uL (ref 0.00–0.07)
Basophils Absolute: 0.1 10*3/uL (ref 0.0–0.1)
Basophils Relative: 2 %
Eosinophils Absolute: 0 10*3/uL (ref 0.0–0.5)
Eosinophils Relative: 1 %
HCT: 35.7 % — ABNORMAL LOW (ref 36.0–46.0)
Hemoglobin: 11.9 g/dL — ABNORMAL LOW (ref 12.0–15.0)
Immature Granulocytes: 1 %
Lymphocytes Relative: 43 %
Lymphs Abs: 1.9 10*3/uL (ref 0.7–4.0)
MCH: 36.1 pg — ABNORMAL HIGH (ref 26.0–34.0)
MCHC: 33.3 g/dL (ref 30.0–36.0)
MCV: 108.2 fL — ABNORMAL HIGH (ref 80.0–100.0)
Monocytes Absolute: 0.3 10*3/uL (ref 0.1–1.0)
Monocytes Relative: 6 %
Neutro Abs: 2.1 10*3/uL (ref 1.7–7.7)
Neutrophils Relative %: 47 %
Platelets: 181 10*3/uL (ref 150–400)
RBC: 3.3 MIL/uL — ABNORMAL LOW (ref 3.87–5.11)
RDW: 14.4 % (ref 11.5–15.5)
WBC: 4.3 10*3/uL (ref 4.0–10.5)
nRBC: 0 % (ref 0.0–0.2)

## 2024-02-10 LAB — COMPREHENSIVE METABOLIC PANEL
ALT: 8 U/L (ref 0–44)
AST: 14 U/L — ABNORMAL LOW (ref 15–41)
Albumin: 3.7 g/dL (ref 3.5–5.0)
Alkaline Phosphatase: 33 U/L — ABNORMAL LOW (ref 38–126)
Anion gap: 10 (ref 5–15)
BUN: 14 mg/dL (ref 8–23)
CO2: 24 mmol/L (ref 22–32)
Calcium: 8.7 mg/dL — ABNORMAL LOW (ref 8.9–10.3)
Chloride: 104 mmol/L (ref 98–111)
Creatinine, Ser: 0.98 mg/dL (ref 0.44–1.00)
GFR, Estimated: 56 mL/min — ABNORMAL LOW (ref >60)
Glucose, Bld: 92 mg/dL (ref 70–99)
Potassium: 3.8 mmol/L (ref 3.5–5.1)
Sodium: 138 mmol/L (ref 135–145)
Total Bilirubin: 0.7 mg/dL (ref 0.0–1.2)
Total Protein: 7.2 g/dL (ref 6.5–8.1)

## 2024-02-10 MED ORDER — DENOSUMAB 120 MG/1.7ML ~~LOC~~ SOLN
120.0000 mg | Freq: Once | SUBCUTANEOUS | Status: AC
  Administered 2024-02-10: 120 mg via SUBCUTANEOUS
  Filled 2024-02-10: qty 1.7

## 2024-02-10 NOTE — Progress Notes (Signed)
 Kara Woods presents today for injection per the provider's orders. Patient's calcium 8.7 . Creatinine 0.98.  Patient states she takes over the counter calcium supplements at home but was unable to tell the strenth.   Xgeva administration without incident; injection site WNL; see MAR for injection details.  Patient tolerated procedure well and without incident.  No questions or complaints noted at this time. Discharged from clinic ambulatory in stable condition. Alert and oriented x 3. F/U with [facility] as scheduled.

## 2024-02-10 NOTE — Patient Instructions (Signed)
 [facility]  - A DEPT OF [facility]  Discharge Instructions: Thank you for choosing [facility] to provide your oncology and hematology care.  If you have a lab appointment with the Cancer Center - please note that after April 8th, 2024, all labs will be drawn in the cancer center.  You do not have to check in or register with the main entrance as you have in the past but will complete your check-in in the cancer center.  Wear comfortable clothing and clothing appropriate for easy access to any Portacath or PICC line.   We strive to give you quality time with your provider. You may need to reschedule your appointment if you arrive late (15 or more minutes).  Arriving late affects you and other patients whose appointments are after yours.  Also, if you miss three or more appointments without notifying the office, you may be dismissed from the clinic at the provider's discretion.      For prescription refill requests, have your pharmacy contact our office and allow 72 hours for refills to be completed.    Today you received the following chemotherapy and/or immunotherapy agents Xgeva Denosumab Injection (Oncology) What is this medication? DENOSUMAB (den oh SUE mab) prevents weakened bones caused by cancer. It may also be used to treat noncancerous bone tumors that cannot be removed by surgery. It can also be used to treat high calcium levels in the blood caused by cancer. It works by blocking a protein that causes bones to break down quickly. This slows down the release of calcium from bones, which lowers calcium levels in your blood. It also makes your bones stronger and less likely to break (fracture). This medicine may be used for other purposes; ask your health care provider or pharmacist if you have questions. COMMON BRAND NAME(S): XGEVA What should I tell my care team before I take this medication? They need to know if you have any of these  conditions: Dental disease Having surgery or tooth extraction Infection Kidney disease Low levels of calcium or vitamin D in the blood Malnutrition On hemodialysis Skin conditions or sensitivity Thyroid or parathyroid disease An unusual reaction to denosumab, other medications, foods, dyes, or preservatives Pregnant or trying to get pregnant Breast-feeding How should I use this medication? This medication is for injection under the skin. It is given by your care team in a hospital or clinic setting. A special MedGuide will be given to you before each treatment. Be sure to read this information carefully each time. Talk to your care team about the use of this medication in children. While it may be prescribed for children as young as 13 years for selected conditions, precautions do apply. Overdosage: If you think you have taken too much of this medicine contact a poison control center or emergency room at once. NOTE: This medicine is only for you. Do not share this medicine with others. What if I miss a dose? Keep appointments for follow-up doses. It is important not to miss your dose. Call your care team if you are unable to keep an appointment. What may interact with this medication? Do not take this medication with any of the following: Other medications containing denosumab This medication may also interact with the following: Medications that lower your chance of fighting infection Steroid medications, such as prednisone or cortisone This list may not describe all possible interactions. Give your health care provider a list of all the medicines,  herbs, non-prescription drugs, or dietary supplements you use. Also tell them if you smoke, drink alcohol, or use illegal drugs. Some items may interact with your medicine. What should I watch for while using this medication? Your condition will be monitored carefully while you are receiving this medication. You may need blood work while  taking this medication. This medication may increase your risk of getting an infection. Call your care team for advice if you get a fever, chills, sore throat, or other symptoms of a cold or flu. Do not treat yourself. Try to avoid being around people who are sick. You should make sure you get enough calcium and vitamin D while you are taking this medication, unless your care team tells you not to. Discuss the foods you eat and the vitamins you take with your care team. Some people who take this medication have severe bone, joint, or muscle pain. This medication may also increase your risk for jaw problems or a broken thigh bone. Tell your care team right away if you have severe pain in your jaw, bones, joints, or muscles. Tell your care team if you have any pain that does not go away or that gets worse. Talk to your care team if you may be pregnant. Serious birth defects can occur if you take this medication during pregnancy and for 5 months after the last dose. You will need a negative pregnancy test before starting this medication. Contraception is recommended while taking this medication and for 5 months after the last dose. Your care team can help you find the option that works for you. What side effects may I notice from receiving this medication? Side effects that you should report to your care team as soon as possible: Allergic reactions--skin rash, itching, hives, swelling of the face, lips, tongue, or throat Bone, joint, or muscle pain Low calcium level--muscle pain or cramps, confusion, tingling, or numbness in the hands or feet Osteonecrosis of the jaw--pain, swelling, or redness in the mouth, numbness of the jaw, poor healing after dental work, unusual discharge from the mouth, visible bones in the mouth Side effects that usually do not require medical attention (report to your care team if they continue or are bothersome): Cough Diarrhea Fatigue Headache Nausea This list may not  describe all possible side effects. Call your doctor for medical advice about side effects. You may report side effects to FDA at 1-800-FDA-1088. Where should I keep my medication? This medication is given in a hospital or clinic. It will not be stored at home. NOTE: This sheet is a summary. It may not cover all possible information. If you have questions about this medicine, talk to your doctor, pharmacist, or health care provider.  2024 Elsevier/Gold Standard (2022-04-03 00:00:00)      To help prevent nausea and vomiting after your treatment, we encourage you to take your nausea medication as directed.  BELOW ARE SYMPTOMS THAT SHOULD BE REPORTED IMMEDIATELY: *FEVER GREATER THAN 100.4 F (38 C) OR HIGHER *CHILLS OR SWEATING *NAUSEA AND VOMITING THAT IS NOT CONTROLLED WITH YOUR NAUSEA MEDICATION *UNUSUAL SHORTNESS OF BREATH *UNUSUAL BRUISING OR BLEEDING *URINARY PROBLEMS (pain or burning when urinating, or frequent urination) *BOWEL PROBLEMS (unusual diarrhea, constipation, pain near the anus) TENDERNESS IN MOUTH AND THROAT WITH OR WITHOUT PRESENCE OF ULCERS (sore throat, sores in mouth, or a toothache) UNUSUAL RASH, SWELLING OR PAIN  UNUSUAL VAGINAL DISCHARGE OR ITCHING   Items with * indicate a potential emergency and should be followed up as soon  as possible or go to the Emergency Department if any problems should occur.  Please show the CHEMOTHERAPY ALERT CARD or IMMUNOTHERAPY ALERT CARD at check-in to the Emergency Department and triage nurse.  Should you have questions after your visit or need to cancel or reschedule your appointment, please contact [facility] [place] - A DEPT OF [facility] [phone]  and follow the prompts.  Office hours are 8:00 a.m. to 4:30 p.m. Monday - Friday. Please note that voicemails left after 4:00 p.m. may not be returned until the following business day.  We are closed weekends and major holidays. You have access to a nurse at  all times for urgent questions. Please call the main number to the clinic [phone] and follow the prompts.  For any non-urgent questions, you may also contact your provider using MyChart. We now offer e-Visits for anyone 2 and older to request care online for non-urgent symptoms. For details visit [email or web address].   Also download the MyChart app! Go to the app store, search "MyChart", open the app, select [place], and log in with your MyChart username and password.

## 2024-02-11 LAB — CANCER ANTIGEN 15-3: CA 15-3: 26.5 U/mL — ABNORMAL HIGH (ref 0.0–25.0)

## 2024-02-11 LAB — CANCER ANTIGEN 27.29: CA 27.29: 30.7 U/mL (ref 0.0–38.6)

## 2024-02-13 ENCOUNTER — Other Ambulatory Visit: Payer: Self-pay | Admitting: Hematology

## 2024-02-13 ENCOUNTER — Other Ambulatory Visit: Payer: Self-pay

## 2024-02-13 DIAGNOSIS — C50212 Malignant neoplasm of upper-inner quadrant of left female breast: Secondary | ICD-10-CM

## 2024-02-13 NOTE — Progress Notes (Signed)
 Specialty Pharmacy Refill Coordination Note  Kara Woods is a 89 y.o. female contacted today regarding refills of specialty medication(s) Palbociclib [name])   Spoke with patient's daughter Kara Woods  Patient requested Delivery   Delivery date: 02/18/24   Verified address: [address] [place], [place] [address]   Medication will be filled on 03.26.25.     This fill date is pending response to refill request from provider. Patient is aware and if they have not received fill by intended date they must follow up with pharmacy.

## 2024-02-16 ENCOUNTER — Other Ambulatory Visit: Payer: Self-pay

## 2024-02-16 ENCOUNTER — Encounter (HOSPITAL_COMMUNITY): Payer: Self-pay | Admitting: Hematology

## 2024-02-16 MED ORDER — PALBOCICLIB 75 MG PO TABS
75.0000 mg | ORAL_TABLET | Freq: Every day | ORAL | 4 refills | Status: DC
  Filled 2024-02-16: qty 21, 28d supply, fill #0
  Filled 2024-03-15: qty 21, 28d supply, fill #1
  Filled 2024-04-13: qty 21, 28d supply, fill #2
  Filled 2024-05-13: qty 21, 28d supply, fill #3
  Filled 2024-06-04: qty 21, 28d supply, fill #4

## 2024-02-17 ENCOUNTER — Other Ambulatory Visit (HOSPITAL_COMMUNITY): Payer: Self-pay

## 2024-02-17 ENCOUNTER — Inpatient Hospital Stay (HOSPITAL_BASED_OUTPATIENT_CLINIC_OR_DEPARTMENT_OTHER): Payer: Medicare Other | Admitting: Hematology

## 2024-02-17 VITALS — BP 147/80 | HR 66 | Resp 16 | Wt 157.4 lb

## 2024-02-17 DIAGNOSIS — R1310 Dysphagia, unspecified: Secondary | ICD-10-CM | POA: Diagnosis not present

## 2024-02-17 DIAGNOSIS — R197 Diarrhea, unspecified: Secondary | ICD-10-CM | POA: Diagnosis not present

## 2024-02-17 DIAGNOSIS — F32A Depression, unspecified: Secondary | ICD-10-CM | POA: Diagnosis not present

## 2024-02-17 DIAGNOSIS — C50212 Malignant neoplasm of upper-inner quadrant of left female breast: Secondary | ICD-10-CM | POA: Diagnosis not present

## 2024-02-17 DIAGNOSIS — C7951 Secondary malignant neoplasm of bone: Secondary | ICD-10-CM

## 2024-02-17 DIAGNOSIS — C50919 Malignant neoplasm of unspecified site of unspecified female breast: Secondary | ICD-10-CM

## 2024-02-17 DIAGNOSIS — Z79811 Long term (current) use of aromatase inhibitors: Secondary | ICD-10-CM | POA: Diagnosis not present

## 2024-02-17 DIAGNOSIS — F419 Anxiety disorder, unspecified: Secondary | ICD-10-CM | POA: Diagnosis not present

## 2024-02-17 NOTE — Patient Instructions (Signed)
 [facility] at [name] Discharge Instructions   You were seen and examined today by Dr. Ellin Saba.  He reviewed the results of your lab work which are normal/stable.   Continue Ibrance and anastrozole as prescribed.   We will see you back in 3 months. We will repeat a PET scan and lab work prior to this visit.    Return as scheduled.    Thank you for choosing [facility] at [facility] to provide your oncology and hematology care.  To afford each patient quality time with our provider, please arrive at least 15 minutes before your scheduled appointment time.   If you have a lab appointment with the Cancer Center please come in thru the Main Entrance and check in at the main information desk.  You need to re-schedule your appointment should you arrive 10 or more minutes late.  We strive to give you quality time with our providers, and arriving late affects you and other patients whose appointments are after yours.  Also, if you no show three or more times for appointments you may be dismissed from the clinic at the providers discretion.     Again, thank you for choosing [facility].  Our hope is that these requests will decrease the amount of time that you wait before being seen by our physicians.       _____________________________________________________________  Should you have questions after your visit to [facility], please contact our office at [phone] and follow the prompts.  Our office hours are 8:00 a.m. and 4:30 p.m. Monday - Friday.  Please note that voicemails left after 4:00 p.m. may not be returned until the following business day.  We are closed weekends and major holidays.  You do have access to a nurse 24-7, just call the main number to the clinic [phone] and do not press any options, hold on the line and a nurse will answer the phone.    For prescription refill requests, have your  pharmacy contact our office and allow 72 hours.    Due to Covid, you will need to wear a mask upon entering the hospital. If you do not have a mask, a mask will be given to you at the Main Entrance upon arrival. For doctor visits, patients may have 1 support person age 40 or older with them. For treatment visits, patients can not have anyone with them due to social distancing guidelines and our immunocompromised population.

## 2024-02-17 NOTE — Progress Notes (Signed)
Patient is taking Ibrance as prescribed.  She has not missed any doses and reports no side effects at this time.   

## 2024-02-17 NOTE — Progress Notes (Signed)
 [facility] [address], [place] [address]    Clinic Day:  02/17/2024  Referring physician: Benita Stabile, MD  Patient Care Team: Benita Stabile, MD as PCP - General (Internal Medicine) Lennette Bihari, MD as PCP - Cardiology (Cardiology) Jena Gauss Gerrit Friends, MD (Gastroenterology) Therese Sarah, RN as Oncology Nurse Navigator (Oncology) Clinton Gallant, RN as [facility] Care Management Clinton Gallant, RN as [facility] Care Management   ASSESSMENT & PLAN:   Assessment: 1.  T2N0M1 left breast infiltrating lobular carcinoma: -Status post lumpectomy in June 2014, 1.4 cm, grade 1, lymphovascular invasion positive, ER 100%, PR 26%, Ki-67 18%. -She underwent XRT.  She did not take adjuvant endocrine therapy. -Mammogram on 07/13/2020, BI-RADS Category 1. -CEA was 10.3, Ca1 2515.8 and CA 19-9 05. -PET scan on 08/07/2020 shows widespread hypermetabolic bone meta stasis with soft tissue components at T1 and sacrum.  Thoracic nodal metastasis. -MRI of the thoracic spine on 08/15/2020 shows pathological fracture of T1 with mild osseous retropulsion and possible left C8 nerve root encroachment within the foramen at C7-T1.  No cord deformity. -Right third rib biopsy on 08/22/2020 consistent with metastatic breast cancer, ER 100% positive, PR 40% positive, Ki-67 10%, HER-2 2+ and negative by FISH. -Ibrance 100 mg 3 weeks on 1 week off on anastrozole 1 mg daily started on 08/31/2020. -Ibrance held on 10/13/2020 secondary to severe tiredness. - PET scan on 02/19/2021 shows improvement in metastatic disease.   2.  Social/family history: -She quit smoking 20 years ago, smoked 2 packs/day for more than 20 years prior to quitting. -She lives at home by herself and is independent of all activities. -Daughter who has multiple myeloma at age 33.    Plan: 1.  Metastatic breast cancer to the bones, ER/PR positive, HER2 negative: - PET scan (11/13/2023): No  hypermetabolic metastatic disease. - She is tolerating Ibrance and anastrozole very well. - Chronic low back pain is stable.  She reports coughing when eating.  Not entirely clear if she has dysphagia.  She reports clear mucus.  No recent respiratory infections. - Reviewed labs today: Normal LFTs and creatinine.  CBC grossly normal.  CA 15-3 was 26.5 and stable.  CA 27-29 is stable. - I have offered her to do swallow study.  She would like to think about it and let us know.  Continue Ibrance 75 mg 3 weeks on/1 week off along with anastrozole daily.  RTC 3 months for follow-up with repeat tumor markers, LFTs and PET/CT scan.   2.  Bone metastasis: - She is tolerating denosumab very well.  Calcium is 8.7.  Continue denosumab monthly.  No side effects noted.   3.  Depression/anxiety: - Continue Lexapro and clorazepate for anxiety.   4.  Diarrhea: - She has intermittent diarrhea.  Continue Imodium as needed.    Orders Placed This Encounter  Procedures   NM PET Image Restage (PS) Skull Base to Thigh (F-18 FDG)    Standing Status:   Future    Expected Date:   05/19/2024    Expiration Date:   02/16/2025    If indicated for the ordered procedure, I authorize the administration of a radiopharmaceutical per Radiology protocol:   Yes    Preferred imaging location?:   [facility]   CBC with Differential    Standing Status:   Future    Expected Date:   05/13/2024    Expiration Date:   02/16/2025  Comprehensive metabolic panel    Standing Status:   Future    Expected Date:   05/13/2024    Expiration Date:   02/16/2025   Cancer antigen 15-3    Standing Status:   Future    Expected Date:   05/13/2024    Expiration Date:   02/16/2025   Cancer antigen 27.29    Standing Status:   Future    Expected Date:   05/13/2024    Expiration Date:   02/16/2025   SLP eval and treat    Standing Status:   Future    Expiration Date:   02/16/2025      [name] R Teague,acting as a scribe for Doreatha Massed,  MD.,have documented all relevant documentation on the behalf of Doreatha Massed, MD,as directed by  Doreatha Massed, MD while in the presence of Doreatha Massed, MD.  I, Doreatha Massed MD, have reviewed the above documentation for accuracy and completeness, and I agree with the above.     Doreatha Massed, MD   3/25/20252:49 PM  CHIEF COMPLAINT:   Diagnosis: left breast cancer    Cancer Staging  History of breast cancer Staging form: Breast, AJCC 7th Edition - Clinical stage from 04/20/2013: Stage IIA (T2, N0, cM0) - Unsigned - Pathologic: No stage assigned - Unsigned    Prior Therapy: 1. Left lumpectomy in June 2014  2.  XRT  Current Therapy:  Ibrance and anastrozole    HISTORY OF PRESENT ILLNESS:   Oncology History   No history exists.     INTERVAL HISTORY:   Kara Woods is a 89 y.o. female presenting to clinic today for follow up of left breast cancer. She was last seen by me on 11/18/23.  Today, she states that she is doing well overall. Her appetite level is at 100%. Her energy level is at 0%. She is accompanied by her daughter. Kara Woods reports chronic lower back pain she attributes to arthritis. She also notes coughing fits that produce hacking coughs particularly when eating and a white mucus. Coughing fits vary in lengths. [name] also reports occasional dysphagia. She denies having any allergies or any recent respiratory infections. She does not wish to proceed with a swallow study.  Kara Woods also notes alternating constipation and diarrhea, treated with Imodium.   PAST MEDICAL HISTORY:   Past Medical History: Past Medical History:  Diagnosis Date   Anxiety    Arthritis    Blind left eye    Breast cancer (HCC)    left    Colitis    Coronary artery disease    a. 10/2016 Staged PCI: RCA 50p, 78m (2.25x12 Resolute Onyx DES), LCX 50p, 68m (2.75x23 Xience Alpine DES). Residual LAD 50p/m, 16m/d.   Cystocele 10/11/2013   Depression    Diastolic  dysfunction    a. 09/2016 Echo: EF 50%, diffuse hypokinesis, mild LVH, grade 1 diastolic dysfunction, mild mitral regurgitation, mildly dilated left atrium.   GERD (gastroesophageal reflux disease)    occasional Tums only   HOH (hard of hearing)    Pelvic relaxation 10/11/2013   Proctitis    colonsocopy 2007, Canasa suppositories   S/P endoscopy March 2009   mild erosive reflux esophagitis, Schatzki's ring, s/p dilation   Schatzki's ring    Shortness of breath    occ if anxiety   Wears dentures    upper   Wears glasses     Surgical History: Past Surgical History:  Procedure Laterality Date   ABDOMINAL HYSTERECTOMY     ANTERIOR AND  POSTERIOR REPAIR N/A 05/17/2014   Procedure: ANTERIOR (CYSTOCELE) AND POSTERIOR REPAIR (RECTOCELE);  Surgeon: Martina Sinner, MD;  Location: [facility];  Service: Urology;  Laterality: N/A;   APPENDECTOMY     BREAST LUMPECTOMY WITH NEEDLE LOCALIZATION AND AXILLARY SENTINEL LYMPH NODE BX Left 05/03/2013   Procedure: BREAST LUMPECTOMY WITH NEEDLE LOCALIZATION AND AXILLARY SENTINEL LYMPH NODE BX;  Surgeon: Mariella Saa, MD;  Location: [facility];  Service: General;  Laterality: Left;   BREAST SURGERY Left 05/19/13   CARDIAC CATHETERIZATION  1998   CARDIAC CATHETERIZATION N/A 11/04/2016   Procedure: Left Heart Cath and Coronary Angiography;  Surgeon: Lennette Bihari, MD;  Location: [facility];  Service: Cardiovascular;  Laterality: N/A;   CARDIAC CATHETERIZATION N/A 11/05/2016   Procedure: Coronary Stent Intervention;  Surgeon: Lennette Bihari, MD;  Location: [facility];  Service: Cardiovascular;  Laterality: N/A;   CHOLECYSTECTOMY     COLONOSCOPY  05/06/2006   Diffuse inflammatory changes of the rectal mucosa, consistent  with proctitis.  Otherwise, normal colon to terminal ileum   CORONARY ANGIOPLASTY  11/04/2016   CORONARY STENT PLACEMENT     Drug-eluting coronary artery stent, non-bioabsorbable-polymer-coated    CYSTOSCOPY N/A 05/17/2014   Procedure: CYSTOSCOPY;  Surgeon: Martina Sinner, MD;  Location: [facility];  Service: Urology;  Laterality: N/A;   ESOPHAGOGASTRODUODENOSCOPY  02/09/2008   Schatzki ring status post dilation/Distal esophageal erosion consistent with mild erosive reflux  esophagitis, otherwise unremarkable esophagus, normal stomach, D1, D2.   EYE SURGERY     lt cataract-implant   EYE SURGERY     lt-lens repaced,l   LUMBAR DISC SURGERY  1993   RE-EXCISION OF BREAST LUMPECTOMY Left 05/19/2013   Procedure: RE-EXCISION OF BREAST LUMPECTOMY;  Surgeon: Mariella Saa, MD;  Location: [facility];  Service: General;  Laterality: Left;   RETINAL DETACHMENT SURGERY  1978   Left   VAGINAL HYSTERECTOMY Bilateral 05/17/2014   Procedure: HYSTERECTOMY VAGINAL with Bilateral Salpingo-Oophorectomy; Bladder cystotomy repair;  Surgeon: Serita Kyle, MD;  Location: [facility];  Service: Gynecology;  Laterality: Bilateral;   VAGINAL PROLAPSE REPAIR N/A 05/17/2014   Procedure: VAGINAL VAULT PROLAPSE AND GRAFT;  Surgeon: Martina Sinner, MD;  Location: [facility];  Service: Urology;  Laterality: N/A;    Social History: Social History   Socioeconomic History   Marital status: Widowed    Spouse name: Not on file   Number of children: 6   Years of education: Not on file   Highest education level: Not on file  Occupational History   Occupation: Retired  Tobacco Use   Smoking status: Former    Current packs/day: 0.00    Types: Cigarettes    Quit date: 04/28/1990    Years since quitting: 33.8   Smokeless tobacco: Never   Tobacco comments:    quit 28 yrs ago  Substance and Sexual Activity   Alcohol use: Yes    Alcohol/week: 1.0 standard drink of alcohol    Types: 1 Glasses of wine per week    Comment: occ   Drug use: No   Sexual activity: Not Currently    Birth control/protection: Post-menopausal  Other Topics Concern   Not on file  Social History Narrative    02/14/23  lives at home by herself and is independent of all activities.   all her children live about a mile away. Daughter are handling her finances and also helping with her bills.    -Daughter  who has multiple myeloma at age 42   Social Drivers of Health   Financial Resource Strain: Low Risk  (10/07/2022)   Overall Financial Resource Strain (CARDIA)    Difficulty of Paying Living Expenses: Not hard at all  Food Insecurity: No Food Insecurity (10/07/2022)   Hunger Vital Sign    Worried About Running Out of Food in the Last Year: Never true    Ran Out of Food in the Last Year: Never true  Transportation Needs: No Transportation Needs (10/07/2022)   PRAPARE - Administrator, Civil Service (Medical): No    Lack of Transportation (Non-Medical): No  Physical Activity: Insufficiently Active (09/27/2020)   Exercise Vital Sign    Days of Exercise per Week: 2 days    Minutes of Exercise per Session: 10 min  Stress: No Stress Concern Present (01/09/2023)   [employer] of Occupational Health - Occupational Stress Questionnaire    Feeling of Stress : Only a little  Social Connections: Socially Isolated (09/27/2020)   Social Connection and Isolation Panel [NHANES]    Frequency of Communication with Friends and Family: More than three times a week    Frequency of Social Gatherings with Friends and Family: More than three times a week    Attends Religious Services: Never    Database administrator or Organizations: No    Attends Banker Meetings: Never    Marital Status: Widowed  Intimate Partner Violence: Not At Risk (01/09/2023)   Humiliation, Afraid, Rape, and Kick questionnaire    Fear of Current or Ex-Partner: No    Emotionally Abused: No    Physically Abused: No    Sexually Abused: No    Family History: Family History  Problem Relation Age of Onset   Cancer Brother        spinal   Heart attack Mother    Heart attack Father    Heart attack Son    Heart attack  Son    Depression Son    Multiple myeloma Daughter    Anemia Daughter    Colon cancer Neg Hx     Current Medications:  Current Outpatient Medications:    acetaminophen (TYLENOL) 325 MG tablet, Take 2 tablets (650 mg total) by mouth every 6 (six) hours as needed for mild pain or headache (or temp > 37.5 C (99.5 F))., Disp: 40 tablet, Rfl: 0   anastrozole (ARIMIDEX) 1 MG tablet, TAKE 1 TABLET BY MOUTH EVERY DAY, Disp: 90 tablet, Rfl: 2   aspirin 81 MG EC tablet, Take 1 tablet (81 mg total) by mouth daily. Swallow whole., Disp: 60 tablet, Rfl: 2   carboxymethylcellulose (REFRESH PLUS) 0.5 % SOLN, Place 3-4 drops into both eyes 3 (three) times daily as needed (dry eyes). Preservative free, Disp: , Rfl:    escitalopram (LEXAPRO) 20 MG tablet, TAKE 1 TABLET BY MOUTH EVERY DAY, Disp: 90 tablet, Rfl: 2   latanoprost (XALATAN) 0.005 % ophthalmic solution, Place 1 drop into both eyes at bedtime., Disp: , Rfl:    metoprolol tartrate (LOPRESSOR) 25 MG tablet, Take 1 tablet (25 mg total) by mouth 2 (two) times daily., Disp: 180 tablet, Rfl: 3   nitroGLYCERIN (NITROSTAT) 0.4 MG SL tablet, Place 1 tablet (0.4 mg total) under the tongue every 5 (five) minutes as needed., Disp: 25 tablet, Rfl: 3   OLANZapine (ZYPREXA) 2.5 MG tablet, Take 1 tablet (2.5 mg total) by mouth 2 (two) times daily. And an additional tab in the morning, Disp:  180 tablet, Rfl: 4   palbociclib (IBRANCE) 75 MG tablet, Take 1 tablet (75 mg total) by mouth daily. Take for 21 days on, 7 days off, repeat every 28 days., Disp: 21 tablet, Rfl: 4   Allergies: Allergies  Allergen Reactions   Contrast Media [Iodinated Contrast Media] Anaphylaxis    Adverse reaction; pt hyperventilating, c/o CP and SOB    Lipitor [Atorvastatin] Other (See Comments)    Muscle aches, cramps, fatigue, weight loss/poor appetite   Sulfamethoxazole Other (See Comments)    Reaction was years ago unknown   Sulfonamide Derivatives Other (See Comments)     Dizziness     REVIEW OF SYSTEMS:   Review of Systems  Constitutional:  Negative for chills, fatigue and fever.  HENT:   Negative for lump/mass, mouth sores, nosebleeds, sore throat and trouble swallowing.   Eyes:  Negative for eye problems.  Respiratory:  Positive for cough and shortness of breath.   Cardiovascular:  Negative for chest pain, leg swelling and palpitations.  Gastrointestinal:  Positive for constipation and diarrhea. Negative for abdominal pain, nausea and vomiting.  Genitourinary:  Negative for bladder incontinence, difficulty urinating, dysuria, frequency, hematuria and nocturia.   Musculoskeletal:  Positive for back pain (10/10 severity). Negative for arthralgias, flank pain, myalgias and neck pain.  Skin:  Negative for itching and rash.  Neurological:  Positive for dizziness, headaches (occasional) and numbness (in hands).  Hematological:  Does not bruise/bleed easily.  Psychiatric/Behavioral:  Negative for depression, sleep disturbance and suicidal ideas. The patient is not nervous/anxious.   All other systems reviewed and are negative.    VITALS:   Blood pressure (!) 147/80, pulse 66, resp. rate 16, weight 157 lb 6.5 oz (71.4 kg), SpO2 99%.  Wt Readings from Last 3 Encounters:  02/17/24 157 lb 6.5 oz (71.4 kg)  01/12/24 155 lb (70.3 kg)  11/18/23 157 lb 6.4 oz (71.4 kg)    Body mass index is 25.41 kg/m.  Performance status (ECOG): 1 - Symptomatic but completely ambulatory  PHYSICAL EXAM:   Physical Exam Vitals and nursing note reviewed. Exam conducted with a chaperone present.  Constitutional:      Appearance: Normal appearance.  Cardiovascular:     Rate and Rhythm: Normal rate and regular rhythm.     Pulses: Normal pulses.     Heart sounds: Normal heart sounds.  Pulmonary:     Effort: Pulmonary effort is normal.     Breath sounds: Normal breath sounds.  Abdominal:     Palpations: Abdomen is soft. There is no hepatomegaly, splenomegaly or mass.      Tenderness: There is no abdominal tenderness.  Musculoskeletal:     Right lower leg: No edema.     Left lower leg: No edema.  Lymphadenopathy:     Cervical: No cervical adenopathy.     Right cervical: No superficial, deep or posterior cervical adenopathy.    Left cervical: No superficial, deep or posterior cervical adenopathy.     Upper Body:     Right upper body: No supraclavicular or axillary adenopathy.     Left upper body: No supraclavicular or axillary adenopathy.  Neurological:     General: No focal deficit present.     Mental Status: She is alert and oriented to person, place, and time.  Psychiatric:        Mood and Affect: Mood normal.        Behavior: Behavior normal.     LABS:      Latest Ref Rng &  Units 02/10/2024    9:27 AM 01/13/2024    9:40 AM 11/11/2023    1:29 PM  CBC  WBC 4.0 - 10.5 K/uL 4.3  4.9  5.0   Hemoglobin 12.0 - 15.0 g/dL 40.9  81.1  91.4   Hematocrit 36.0 - 46.0 % 35.7  36.8  36.5   Platelets 150 - 400 K/uL 181  232  179       Latest Ref Rng & Units 02/10/2024    9:27 AM 01/13/2024    9:40 AM 12/17/2023    2:01 PM  CMP  Glucose 70 - 99 mg/dL 92  90  782   BUN 8 - 23 mg/dL 14  12  16    Creatinine 0.44 - 1.00 mg/dL 9.56  2.13  0.86   Sodium 135 - 145 mmol/L 138  140  137   Potassium 3.5 - 5.1 mmol/L 3.8  3.9  4.1   Chloride 98 - 111 mmol/L 104  104  103   CO2 22 - 32 mmol/L 24  26  26    Calcium 8.9 - 10.3 mg/dL 8.7  8.6  9.0   Total Protein 6.5 - 8.1 g/dL 7.2  7.3  6.8   Total Bilirubin 0.0 - 1.2 mg/dL 0.7  0.9  0.7   Alkaline Phos 38 - 126 U/L 33  36  37   AST 15 - 41 U/L 14  15  15    ALT 0 - 44 U/L 8  9  8       No results found for: "CEA1", "CEA" / No results found for: "CEA1", "CEA" No results found for: "PSA1" Lab Results  Component Value Date   VHQ469 6 10/09/2021   No results found for: "GEX528"  No results found for: "TOTALPROTELP", "ALBUMINELP", "A1GS", "A2GS", "BETS", "BETA2SER", "GAMS", "MSPIKE", "SPEI" No results found  for: "TIBC", "FERRITIN", "IRONPCTSAT" No results found for: "LDH"   STUDIES:   No results found.

## 2024-02-27 ENCOUNTER — Other Ambulatory Visit: Payer: Self-pay | Admitting: Neurology

## 2024-03-01 NOTE — Telephone Encounter (Signed)
 Requested Prescriptions   Pending Prescriptions Disp Refills   clorazepate (TRANXENE) 3.75 MG tablet [Pharmacy Med Name: CLORAZEPATE 3.75 MG TABLET] 60 tablet     Sig: TAKE 1 TABLET BY MOUTH 2 TIMES DAILY AS NEEDED FOR ANXIETY.    Last seen 01/12/24, next appt not scheduled  I do not see   clorazepate (TRANXENE) 3.75 MG tablet    On med list and it was dc by: Ordering User: Mauro Kaufmann, NP      Last note from Dr. Teresa Coombs stated: Patient Instructions  Continue with Zyprexa, refill given Continue your other medications Continue follow-up PCP Return in 1 year or sooner if worse.   Routing to him to clarify

## 2024-03-02 ENCOUNTER — Telehealth: Payer: Self-pay

## 2024-03-02 NOTE — Telephone Encounter (Signed)
 Please clarify with daughter who started the medication. I might have refilled it by mistake in the past but I am sure I did not start it.

## 2024-03-02 NOTE — Telephone Encounter (Signed)
 Daughter called stating tranxene script was expired and requesting refill. She is not sure why it was discontinued by another provider. Advised I would send to Dr. Teresa Coombs for review.

## 2024-03-02 NOTE — Telephone Encounter (Signed)
 Call to daughter, she states that oncologist may have prescribed and is aware Dr. Teresa Coombs will not be refilling.

## 2024-03-09 ENCOUNTER — Other Ambulatory Visit: Payer: Self-pay

## 2024-03-09 ENCOUNTER — Inpatient Hospital Stay: Payer: Medicare Other

## 2024-03-09 DIAGNOSIS — C50919 Malignant neoplasm of unspecified site of unspecified female breast: Secondary | ICD-10-CM

## 2024-03-10 ENCOUNTER — Inpatient Hospital Stay: Attending: Hematology

## 2024-03-10 ENCOUNTER — Inpatient Hospital Stay

## 2024-03-10 VITALS — BP 130/72 | HR 67 | Resp 18

## 2024-03-10 DIAGNOSIS — F419 Anxiety disorder, unspecified: Secondary | ICD-10-CM | POA: Diagnosis not present

## 2024-03-10 DIAGNOSIS — Z807 Family history of other malignant neoplasms of lymphoid, hematopoietic and related tissues: Secondary | ICD-10-CM | POA: Diagnosis not present

## 2024-03-10 DIAGNOSIS — Z87891 Personal history of nicotine dependence: Secondary | ICD-10-CM | POA: Insufficient documentation

## 2024-03-10 DIAGNOSIS — F32A Depression, unspecified: Secondary | ICD-10-CM | POA: Diagnosis not present

## 2024-03-10 DIAGNOSIS — Z79899 Other long term (current) drug therapy: Secondary | ICD-10-CM | POA: Diagnosis not present

## 2024-03-10 DIAGNOSIS — R197 Diarrhea, unspecified: Secondary | ICD-10-CM | POA: Diagnosis not present

## 2024-03-10 DIAGNOSIS — C50212 Malignant neoplasm of upper-inner quadrant of left female breast: Secondary | ICD-10-CM | POA: Insufficient documentation

## 2024-03-10 DIAGNOSIS — C50919 Malignant neoplasm of unspecified site of unspecified female breast: Secondary | ICD-10-CM

## 2024-03-10 DIAGNOSIS — Z923 Personal history of irradiation: Secondary | ICD-10-CM | POA: Diagnosis not present

## 2024-03-10 DIAGNOSIS — R443 Hallucinations, unspecified: Secondary | ICD-10-CM | POA: Insufficient documentation

## 2024-03-10 DIAGNOSIS — C7951 Secondary malignant neoplasm of bone: Secondary | ICD-10-CM | POA: Diagnosis not present

## 2024-03-10 DIAGNOSIS — Z79811 Long term (current) use of aromatase inhibitors: Secondary | ICD-10-CM | POA: Insufficient documentation

## 2024-03-10 DIAGNOSIS — M25473 Effusion, unspecified ankle: Secondary | ICD-10-CM | POA: Diagnosis not present

## 2024-03-10 DIAGNOSIS — Z853 Personal history of malignant neoplasm of breast: Secondary | ICD-10-CM

## 2024-03-10 LAB — COMPREHENSIVE METABOLIC PANEL WITH GFR
ALT: 8 U/L (ref 0–44)
AST: 17 U/L (ref 15–41)
Albumin: 3.8 g/dL (ref 3.5–5.0)
Alkaline Phosphatase: 35 U/L — ABNORMAL LOW (ref 38–126)
Anion gap: 13 (ref 5–15)
BUN: 12 mg/dL (ref 8–23)
CO2: 24 mmol/L (ref 22–32)
Calcium: 9 mg/dL (ref 8.9–10.3)
Chloride: 102 mmol/L (ref 98–111)
Creatinine, Ser: 0.92 mg/dL (ref 0.44–1.00)
GFR, Estimated: 60 mL/min — ABNORMAL LOW (ref >60)
Glucose, Bld: 102 mg/dL — ABNORMAL HIGH (ref 70–99)
Potassium: 3.8 mmol/L (ref 3.5–5.1)
Sodium: 139 mmol/L (ref 135–145)
Total Bilirubin: 0.8 mg/dL (ref 0.0–1.2)
Total Protein: 7.3 g/dL (ref 6.5–8.1)

## 2024-03-10 MED ORDER — DENOSUMAB 120 MG/1.7ML ~~LOC~~ SOLN
120.0000 mg | Freq: Once | SUBCUTANEOUS | Status: AC
  Administered 2024-03-10: 120 mg via SUBCUTANEOUS
  Filled 2024-03-10: qty 1.7

## 2024-03-10 NOTE — Progress Notes (Signed)
Patient taking calcium as directed. Denied tooth, jaw, and leg pain. No recent or upcoming dental visits. Labs reviewed. Patient tolerated injection with no complaints voiced. See MAR for details. Patient stable during and after injection. Site clean and dry with no bruising or swelling noted. Band aid applied. Vss with discharge and left in satisfactory condition with no s/s of distress.  

## 2024-03-10 NOTE — Patient Instructions (Signed)
 [facility] - A DEPT OF [facility]  Discharge Instructions: Thank you for choosing [facility] to provide your oncology and hematology care.  If you have a lab appointment with the Cancer Center - please note that after April 8th, 2024, all labs will be drawn in the cancer center.  You do not have to check in or register with the main entrance as you have in the past but will complete your check-in in the cancer center.  Wear comfortable clothing and clothing appropriate for easy access to any Portacath or PICC line.   We strive to give you quality time with your provider. You may need to reschedule your appointment if you arrive late (15 or more minutes).  Arriving late affects you and other patients whose appointments are after yours.  Also, if you miss three or more appointments without notifying the office, you may be dismissed from the clinic at the provider's discretion.      For prescription refill requests, have your pharmacy contact our office and allow 72 hours for refills to be completed.    Today you received the following  Xgeva, return as scheduled.   To help prevent nausea and vomiting after your treatment, we encourage you to take your nausea medication as directed.  BELOW ARE SYMPTOMS THAT SHOULD BE REPORTED IMMEDIATELY: *FEVER GREATER THAN 100.4 F (38 C) OR HIGHER *CHILLS OR SWEATING *NAUSEA AND VOMITING THAT IS NOT CONTROLLED WITH YOUR NAUSEA MEDICATION *UNUSUAL SHORTNESS OF BREATH *UNUSUAL BRUISING OR BLEEDING *URINARY PROBLEMS (pain or burning when urinating, or frequent urination) *BOWEL PROBLEMS (unusual diarrhea, constipation, pain near the anus) TENDERNESS IN MOUTH AND THROAT WITH OR WITHOUT PRESENCE OF ULCERS (sore throat, sores in mouth, or a toothache) UNUSUAL RASH, SWELLING OR PAIN  UNUSUAL VAGINAL DISCHARGE OR ITCHING   Items with * indicate a potential emergency and should be followed up as soon as possible or  go to the Emergency Department if any problems should occur.  Please show the CHEMOTHERAPY ALERT CARD or IMMUNOTHERAPY ALERT CARD at check-in to the Emergency Department and triage nurse.  Should you have questions after your visit or need to cancel or reschedule your appointment, please contact [facility] [place] - A DEPT OF [facility] [phone]  and follow the prompts.  Office hours are 8:00 a.m. to 4:30 p.m. Monday - Friday. Please note that voicemails left after 4:00 p.m. may not be returned until the following business day.  We are closed weekends and major holidays. You have access to a nurse at all times for urgent questions. Please call the main number to the clinic [phone] and follow the prompts.  For any non-urgent questions, you may also contact your provider using MyChart. We now offer e-Visits for anyone 56 and older to request care online for non-urgent symptoms. For details visit [email or web address].   Also download the MyChart app! Go to the app store, search "MyChart", open the app, select [place], and log in with your MyChart username and password.

## 2024-03-15 ENCOUNTER — Other Ambulatory Visit: Payer: Self-pay

## 2024-03-15 NOTE — Progress Notes (Signed)
 Specialty Pharmacy Refill Coordination Note  Kara Woods is a 89 y.o. female contacted today regarding refills of specialty medication(s) Palbociclib  (IBRANCE )   Patient requested (Patient-Rptd) Delivery   Delivery date: 03/24/24   Verified address: (Patient-Rptd) [address] ,  [place], [place] [address]   Medication will be filled on 03/23/24.

## 2024-03-22 NOTE — Telephone Encounter (Signed)
 Daughter Wille Harms on [place], called needing to speak to a nurse regarding the pt medication. Please advise.

## 2024-03-22 NOTE — Telephone Encounter (Signed)
 Call to daughter, she reports need tranxene  refilled. Reviewed previous notes that Dr. Samara Crest was not original prescriber but did decrease the dose and prescription was refused previously by him. She states patient is having anxiety and needs medication.reviewed notes and duaghter still not interested in psychiatric eval. Advised I would sen to him again but he was not the original prescriber

## 2024-03-23 ENCOUNTER — Other Ambulatory Visit: Payer: Self-pay | Admitting: Neurology

## 2024-03-23 ENCOUNTER — Other Ambulatory Visit: Payer: Self-pay

## 2024-03-23 MED ORDER — CLORAZEPATE DIPOTASSIUM 3.75 MG PO TABS: 3.7500 mg | ORAL_TABLET | Freq: Two times a day (BID) | ORAL | 0 refills | Status: AC | PRN

## 2024-03-23 NOTE — Addendum Note (Signed)
 Addended byCassandra Cleveland on: 03/23/2024 01:45 PM   Modules accepted: Orders

## 2024-03-23 NOTE — Telephone Encounter (Signed)
 Pt's daughter Arlo Lama [phone] called and requested for RN to call her back when available.

## 2024-03-25 ENCOUNTER — Encounter (HOSPITAL_COMMUNITY): Payer: Self-pay | Admitting: Hematology

## 2024-03-25 ENCOUNTER — Other Ambulatory Visit (HOSPITAL_COMMUNITY): Payer: Self-pay

## 2024-03-25 ENCOUNTER — Telehealth: Payer: Self-pay

## 2024-03-25 NOTE — Telephone Encounter (Signed)
 Pharmacy Patient Advocate Encounter   Received notification from CoverMyMeds that prior authorization for Clorazepate  Dipotassium 3.75MG  tablets is required/requested.   Insurance verification completed.   The patient is insured through [facility] .   Per test claim: PA required; PA submitted to above mentioned insurance via CoverMyMeds Key/confirmation #/EOC [facility] Status is pending

## 2024-03-26 ENCOUNTER — Other Ambulatory Visit (HOSPITAL_COMMUNITY): Payer: Self-pay

## 2024-03-26 NOTE — Telephone Encounter (Signed)
 Pharmacy Patient Advocate Encounter  Received notification from [facility] that Prior Authorization for Clorazepate  Dipotassium 3.75MG  tablets has been APPROVED from 11/26/2023 to 04/24/2024   PA #/Case ID/Reference #: PA Case ID #: [id number]

## 2024-04-05 ENCOUNTER — Other Ambulatory Visit: Payer: Self-pay

## 2024-04-05 DIAGNOSIS — C50919 Malignant neoplasm of unspecified site of unspecified female breast: Secondary | ICD-10-CM

## 2024-04-06 ENCOUNTER — Inpatient Hospital Stay

## 2024-04-13 ENCOUNTER — Other Ambulatory Visit: Payer: Self-pay

## 2024-04-13 DIAGNOSIS — C50919 Malignant neoplasm of unspecified site of unspecified female breast: Secondary | ICD-10-CM

## 2024-04-13 NOTE — Progress Notes (Signed)
 Specialty Pharmacy Refill Coordination Note  Kara Woods is a 89 y.o. female contacted today regarding refills of specialty medication(s) Palbociclib  (IBRANCE )   Patient requested (Patient-Rptd) Delivery   Delivery date: (Patient-Rptd) 04/21/24   Verified address: (Patient-Rptd) [address] [place] [address]   Medication will be filled on 04/20/24.

## 2024-04-14 ENCOUNTER — Inpatient Hospital Stay: Attending: Hematology

## 2024-04-14 VITALS — BP 128/86 | HR 62 | Temp 97.5°F | Resp 18

## 2024-04-14 DIAGNOSIS — F32A Depression, unspecified: Secondary | ICD-10-CM | POA: Insufficient documentation

## 2024-04-14 DIAGNOSIS — Z923 Personal history of irradiation: Secondary | ICD-10-CM | POA: Insufficient documentation

## 2024-04-14 DIAGNOSIS — F419 Anxiety disorder, unspecified: Secondary | ICD-10-CM | POA: Diagnosis not present

## 2024-04-14 DIAGNOSIS — C50212 Malignant neoplasm of upper-inner quadrant of left female breast: Secondary | ICD-10-CM | POA: Insufficient documentation

## 2024-04-14 DIAGNOSIS — Z87891 Personal history of nicotine dependence: Secondary | ICD-10-CM | POA: Diagnosis not present

## 2024-04-14 DIAGNOSIS — C50919 Malignant neoplasm of unspecified site of unspecified female breast: Secondary | ICD-10-CM

## 2024-04-14 DIAGNOSIS — Z79899 Other long term (current) drug therapy: Secondary | ICD-10-CM | POA: Diagnosis not present

## 2024-04-14 DIAGNOSIS — M25473 Effusion, unspecified ankle: Secondary | ICD-10-CM | POA: Diagnosis not present

## 2024-04-14 DIAGNOSIS — C7951 Secondary malignant neoplasm of bone: Secondary | ICD-10-CM | POA: Insufficient documentation

## 2024-04-14 DIAGNOSIS — Z807 Family history of other malignant neoplasms of lymphoid, hematopoietic and related tissues: Secondary | ICD-10-CM | POA: Insufficient documentation

## 2024-04-14 DIAGNOSIS — R443 Hallucinations, unspecified: Secondary | ICD-10-CM | POA: Insufficient documentation

## 2024-04-14 DIAGNOSIS — R197 Diarrhea, unspecified: Secondary | ICD-10-CM | POA: Insufficient documentation

## 2024-04-14 DIAGNOSIS — Z853 Personal history of malignant neoplasm of breast: Secondary | ICD-10-CM

## 2024-04-14 DIAGNOSIS — Z79811 Long term (current) use of aromatase inhibitors: Secondary | ICD-10-CM | POA: Insufficient documentation

## 2024-04-14 LAB — CBC WITH DIFFERENTIAL/PLATELET
Abs Immature Granulocytes: 0.01 10*3/uL (ref 0.00–0.07)
Basophils Absolute: 0.1 10*3/uL (ref 0.0–0.1)
Basophils Relative: 1 %
Eosinophils Absolute: 0 10*3/uL (ref 0.0–0.5)
Eosinophils Relative: 1 %
HCT: 35.8 % — ABNORMAL LOW (ref 36.0–46.0)
Hemoglobin: 11.8 g/dL — ABNORMAL LOW (ref 12.0–15.0)
Immature Granulocytes: 0 %
Lymphocytes Relative: 53 %
Lymphs Abs: 2.2 10*3/uL (ref 0.7–4.0)
MCH: 35.6 pg — ABNORMAL HIGH (ref 26.0–34.0)
MCHC: 33 g/dL (ref 30.0–36.0)
MCV: 108.2 fL — ABNORMAL HIGH (ref 80.0–100.0)
Monocytes Absolute: 0.3 10*3/uL (ref 0.1–1.0)
Monocytes Relative: 6 %
Neutro Abs: 1.6 10*3/uL — ABNORMAL LOW (ref 1.7–7.7)
Neutrophils Relative %: 39 %
Platelets: 149 10*3/uL — ABNORMAL LOW (ref 150–400)
RBC: 3.31 MIL/uL — ABNORMAL LOW (ref 3.87–5.11)
RDW: 14 % (ref 11.5–15.5)
WBC: 4.2 10*3/uL (ref 4.0–10.5)
nRBC: 0 % (ref 0.0–0.2)

## 2024-04-14 LAB — COMPREHENSIVE METABOLIC PANEL WITH GFR
ALT: 9 U/L (ref 0–44)
AST: 15 U/L (ref 15–41)
Albumin: 3.5 g/dL (ref 3.5–5.0)
Alkaline Phosphatase: 40 U/L (ref 38–126)
Anion gap: 5 (ref 5–15)
BUN: 13 mg/dL (ref 8–23)
CO2: 27 mmol/L (ref 22–32)
Calcium: 8.8 mg/dL — ABNORMAL LOW (ref 8.9–10.3)
Chloride: 105 mmol/L (ref 98–111)
Creatinine, Ser: 0.82 mg/dL (ref 0.44–1.00)
GFR, Estimated: >60 mL/min (ref >60)
Glucose, Bld: 94 mg/dL (ref 70–99)
Potassium: 3.7 mmol/L (ref 3.5–5.1)
Sodium: 137 mmol/L (ref 135–145)
Total Bilirubin: 0.7 mg/dL (ref 0.0–1.2)
Total Protein: 6.9 g/dL (ref 6.5–8.1)

## 2024-04-14 MED ORDER — DENOSUMAB 120 MG/1.7ML ~~LOC~~ SOLN
120.0000 mg | Freq: Once | SUBCUTANEOUS | Status: AC
  Administered 2024-04-14: 120 mg via SUBCUTANEOUS
  Filled 2024-04-14: qty 1.7

## 2024-04-14 NOTE — Progress Notes (Signed)
 Labs reviewed. Per pt she is taking her calcium  and Vit D at home as prescribed.   Patient tolerated Xgeva  injection with no complaints voiced.  Site clean and dry with no bruising or swelling noted at site.  See MAR for details.  Band aid applied.  Patient stable during and after injection.  Vss with discharge and left in satisfactory condition with no s/s of distress noted. All follow ups as scheduled.   Kara Woods

## 2024-04-14 NOTE — Patient Instructions (Signed)
 Denosumab Injection (Oncology) What is this medication? DENOSUMAB (den oh SUE mab) prevents weakened bones caused by cancer. It may also be used to treat noncancerous bone tumors that cannot be removed by surgery. It can also be used to treat high calcium levels in the blood caused by cancer. It works by blocking a protein that causes bones to break down quickly. This slows down the release of calcium from bones, which lowers calcium levels in your blood. It also makes your bones stronger and less likely to break (fracture). This medicine may be used for other purposes; ask your health care provider or pharmacist if you have questions. COMMON BRAND NAME(S): XGEVA What should I tell my care team before I take this medication? They need to know if you have any of these conditions: Dental disease Having surgery or tooth extraction Infection Kidney disease Low levels of calcium or vitamin D in the blood Malnutrition On hemodialysis Skin conditions or sensitivity Thyroid or parathyroid disease An unusual reaction to denosumab, other medications, foods, dyes, or preservatives Pregnant or trying to get pregnant Breast-feeding How should I use this medication? This medication is for injection under the skin. It is given by your care team in a hospital or clinic setting. A special MedGuide will be given to you before each treatment. Be sure to read this information carefully each time. Talk to your care team about the use of this medication in children. While it may be prescribed for children as young as 13 years for selected conditions, precautions do apply. Overdosage: If you think you have taken too much of this medicine contact a poison control center or emergency room at once. NOTE: This medicine is only for you. Do not share this medicine with others. What if I miss a dose? Keep appointments for follow-up doses. It is important not to miss your dose. Call your care team if you are unable to  keep an appointment. What may interact with this medication? Do not take this medication with any of the following: Other medications containing denosumab This medication may also interact with the following: Medications that lower your chance of fighting infection Steroid medications, such as prednisone or cortisone This list may not describe all possible interactions. Give your health care provider a list of all the medicines, herbs, non-prescription drugs, or dietary supplements you use. Also tell them if you smoke, drink alcohol, or use illegal drugs. Some items may interact with your medicine. What should I watch for while using this medication? Your condition will be monitored carefully while you are receiving this medication. You may need blood work while taking this medication. This medication may increase your risk of getting an infection. Call your care team for advice if you get a fever, chills, sore throat, or other symptoms of a cold or flu. Do not treat yourself. Try to avoid being around people who are sick. You should make sure you get enough calcium and vitamin D while you are taking this medication, unless your care team tells you not to. Discuss the foods you eat and the vitamins you take with your care team. Some people who take this medication have severe bone, joint, or muscle pain. This medication may also increase your risk for jaw problems or a broken thigh bone. Tell your care team right away if you have severe pain in your jaw, bones, joints, or muscles. Tell your care team if you have any pain that does not go away or that gets worse. Talk  to your care team if you may be pregnant. Serious birth defects can occur if you take this medication during pregnancy and for 5 months after the last dose. You will need a negative pregnancy test before starting this medication. Contraception is recommended while taking this medication and for 5 months after the last dose. Your care team  can help you find the option that works for you. What side effects may I notice from receiving this medication? Side effects that you should report to your care team as soon as possible: Allergic reactions--skin rash, itching, hives, swelling of the face, lips, tongue, or throat Bone, joint, or muscle pain Low calcium level--muscle pain or cramps, confusion, tingling, or numbness in the hands or feet Osteonecrosis of the jaw--pain, swelling, or redness in the mouth, numbness of the jaw, poor healing after dental work, unusual discharge from the mouth, visible bones in the mouth Side effects that usually do not require medical attention (report to your care team if they continue or are bothersome): Cough Diarrhea Fatigue Headache Nausea This list may not describe all possible side effects. Call your doctor for medical advice about side effects. You may report side effects to FDA at 1-800-FDA-1088. Where should I keep my medication? This medication is given in a hospital or clinic. It will not be stored at home. NOTE: This sheet is a summary. It may not cover all possible information. If you have questions about this medicine, talk to your doctor, pharmacist, or health care provider.  2024 Elsevier/Gold Standard (2022-04-03 00:00:00)

## 2024-05-04 ENCOUNTER — Inpatient Hospital Stay

## 2024-05-04 DIAGNOSIS — H1812 Bullous keratopathy, left eye: Secondary | ICD-10-CM | POA: Diagnosis not present

## 2024-05-04 DIAGNOSIS — H04123 Dry eye syndrome of bilateral lacrimal glands: Secondary | ICD-10-CM | POA: Diagnosis not present

## 2024-05-04 DIAGNOSIS — H401131 Primary open-angle glaucoma, bilateral, mild stage: Secondary | ICD-10-CM | POA: Diagnosis not present

## 2024-05-04 DIAGNOSIS — H18513 Endothelial corneal dystrophy, bilateral: Secondary | ICD-10-CM | POA: Diagnosis not present

## 2024-05-12 ENCOUNTER — Inpatient Hospital Stay: Attending: Hematology

## 2024-05-12 ENCOUNTER — Inpatient Hospital Stay

## 2024-05-12 VITALS — BP 119/75 | HR 70 | Temp 97.5°F | Resp 18

## 2024-05-12 DIAGNOSIS — F32A Depression, unspecified: Secondary | ICD-10-CM | POA: Insufficient documentation

## 2024-05-12 DIAGNOSIS — C7951 Secondary malignant neoplasm of bone: Secondary | ICD-10-CM | POA: Diagnosis not present

## 2024-05-12 DIAGNOSIS — Z923 Personal history of irradiation: Secondary | ICD-10-CM | POA: Diagnosis not present

## 2024-05-12 DIAGNOSIS — Z87891 Personal history of nicotine dependence: Secondary | ICD-10-CM | POA: Diagnosis not present

## 2024-05-12 DIAGNOSIS — M25473 Effusion, unspecified ankle: Secondary | ICD-10-CM | POA: Diagnosis not present

## 2024-05-12 DIAGNOSIS — F419 Anxiety disorder, unspecified: Secondary | ICD-10-CM | POA: Insufficient documentation

## 2024-05-12 DIAGNOSIS — Z79899 Other long term (current) drug therapy: Secondary | ICD-10-CM | POA: Diagnosis not present

## 2024-05-12 DIAGNOSIS — Z807 Family history of other malignant neoplasms of lymphoid, hematopoietic and related tissues: Secondary | ICD-10-CM | POA: Diagnosis not present

## 2024-05-12 DIAGNOSIS — R443 Hallucinations, unspecified: Secondary | ICD-10-CM | POA: Insufficient documentation

## 2024-05-12 DIAGNOSIS — R197 Diarrhea, unspecified: Secondary | ICD-10-CM | POA: Diagnosis not present

## 2024-05-12 DIAGNOSIS — C50919 Malignant neoplasm of unspecified site of unspecified female breast: Secondary | ICD-10-CM

## 2024-05-12 DIAGNOSIS — Z79811 Long term (current) use of aromatase inhibitors: Secondary | ICD-10-CM | POA: Diagnosis not present

## 2024-05-12 DIAGNOSIS — C50212 Malignant neoplasm of upper-inner quadrant of left female breast: Secondary | ICD-10-CM | POA: Diagnosis not present

## 2024-05-12 DIAGNOSIS — Z853 Personal history of malignant neoplasm of breast: Secondary | ICD-10-CM

## 2024-05-12 LAB — COMPREHENSIVE METABOLIC PANEL WITH GFR
ALT: 7 U/L (ref 0–44)
AST: 15 U/L (ref 15–41)
Albumin: 3.8 g/dL (ref 3.5–5.0)
Alkaline Phosphatase: 39 U/L (ref 38–126)
Anion gap: 10 (ref 5–15)
BUN: 14 mg/dL (ref 8–23)
CO2: 22 mmol/L (ref 22–32)
Calcium: 8.7 mg/dL — ABNORMAL LOW (ref 8.9–10.3)
Chloride: 106 mmol/L (ref 98–111)
Creatinine, Ser: 1.02 mg/dL — ABNORMAL HIGH (ref 0.44–1.00)
GFR, Estimated: 53 mL/min — ABNORMAL LOW (ref >60)
Glucose, Bld: 98 mg/dL (ref 70–99)
Potassium: 3.9 mmol/L (ref 3.5–5.1)
Sodium: 138 mmol/L (ref 135–145)
Total Bilirubin: 0.8 mg/dL (ref 0.0–1.2)
Total Protein: 7.3 g/dL (ref 6.5–8.1)

## 2024-05-12 LAB — CBC WITH DIFFERENTIAL/PLATELET
Abs Immature Granulocytes: 0.01 10*3/uL (ref 0.00–0.07)
Basophils Absolute: 0.1 10*3/uL (ref 0.0–0.1)
Basophils Relative: 1 %
Eosinophils Absolute: 0 10*3/uL (ref 0.0–0.5)
Eosinophils Relative: 1 %
HCT: 35.2 % — ABNORMAL LOW (ref 36.0–46.0)
Hemoglobin: 12.3 g/dL (ref 12.0–15.0)
Immature Granulocytes: 0 %
Lymphocytes Relative: 46 %
Lymphs Abs: 2.5 10*3/uL (ref 0.7–4.0)
MCH: 37.3 pg — ABNORMAL HIGH (ref 26.0–34.0)
MCHC: 34.9 g/dL (ref 30.0–36.0)
MCV: 106.7 fL — ABNORMAL HIGH (ref 80.0–100.0)
Monocytes Absolute: 0.3 10*3/uL (ref 0.1–1.0)
Monocytes Relative: 6 %
Neutro Abs: 2.4 10*3/uL (ref 1.7–7.7)
Neutrophils Relative %: 46 %
Platelets: 163 10*3/uL (ref 150–400)
RBC: 3.3 MIL/uL — ABNORMAL LOW (ref 3.87–5.11)
RDW: 13.8 % (ref 11.5–15.5)
WBC: 5.3 10*3/uL (ref 4.0–10.5)
nRBC: 0 % (ref 0.0–0.2)

## 2024-05-12 MED ORDER — DENOSUMAB 120 MG/1.7ML ~~LOC~~ SOLN
120.0000 mg | Freq: Once | SUBCUTANEOUS | Status: AC
  Administered 2024-05-12: 120 mg via SUBCUTANEOUS
  Filled 2024-05-12: qty 1.7

## 2024-05-12 NOTE — Patient Instructions (Signed)

## 2024-05-12 NOTE — Progress Notes (Signed)
 Patient tolerated injection with no complaints voiced.  Site clean and dry with no bruising or swelling noted at site.  See MAR for details.  Band aid applied.  Patient stable during and after injection.  Vss with discharge and left in satisfactory condition with no s/s of distress noted.

## 2024-05-13 ENCOUNTER — Other Ambulatory Visit (HOSPITAL_COMMUNITY): Payer: Self-pay

## 2024-05-13 ENCOUNTER — Encounter (INDEPENDENT_AMBULATORY_CARE_PROVIDER_SITE_OTHER): Payer: Self-pay

## 2024-05-13 ENCOUNTER — Other Ambulatory Visit: Payer: Self-pay

## 2024-05-13 LAB — CANCER ANTIGEN 27.29: CA 27.29: 35.8 U/mL (ref 0.0–38.6)

## 2024-05-13 LAB — CANCER ANTIGEN 15-3: CA 15-3: 24.9 U/mL (ref 0.0–25.0)

## 2024-05-13 NOTE — Progress Notes (Signed)
 Specialty Pharmacy Refill Coordination Note  MyChart Questionnaire Submission  Kara Woods is a 89 y.o. female contacted today regarding refills of specialty medication(s) Ibrance .  Next cycle approx: 05/18/24.   Patient requested: (Patient-Rptd) Delivery   Delivery date: 05/14/24   Verified address: (Patient-Rptd) [address] [place] [address]  Medication will be filled on 05/13/24.

## 2024-05-20 ENCOUNTER — Encounter (HOSPITAL_COMMUNITY)
Admission: RE | Admit: 2024-05-20 | Discharge: 2024-05-20 | Disposition: A | Discharge status: Home / Self Care | Source: Ambulatory Visit | Attending: Hematology | Admitting: Hematology

## 2024-05-20 DIAGNOSIS — C7951 Secondary malignant neoplasm of bone: Secondary | ICD-10-CM | POA: Insufficient documentation

## 2024-05-20 DIAGNOSIS — C50919 Malignant neoplasm of unspecified site of unspecified female breast: Secondary | ICD-10-CM | POA: Insufficient documentation

## 2024-05-20 MED ORDER — FLUDEOXYGLUCOSE F - 18 (FDG) INJECTION
8.4800 | Freq: Once | INTRAVENOUS | Status: AC | PRN
  Administered 2024-05-20: 8.48 via INTRAVENOUS

## 2024-06-01 ENCOUNTER — Inpatient Hospital Stay

## 2024-06-01 ENCOUNTER — Inpatient Hospital Stay: Admitting: Hematology

## 2024-06-04 ENCOUNTER — Other Ambulatory Visit: Payer: Self-pay

## 2024-06-04 ENCOUNTER — Encounter (INDEPENDENT_AMBULATORY_CARE_PROVIDER_SITE_OTHER): Payer: Self-pay

## 2024-06-04 ENCOUNTER — Other Ambulatory Visit (HOSPITAL_COMMUNITY): Payer: Self-pay

## 2024-06-04 ENCOUNTER — Other Ambulatory Visit: Payer: Self-pay | Admitting: Pharmacy Technician

## 2024-06-04 NOTE — Progress Notes (Signed)
 Specialty Pharmacy Refill Coordination Note  Kara Woods is a 89 y.o. female contacted today regarding refills of specialty medication(s) Palbociclib  (IBRANCE )   Patient requested (Patient-Rptd) Delivery   Delivery date: 06/09/24 Verified address: (Patient-Rptd) [address]  [place] [address]   Medication will be filled on 06/08/24.

## 2024-06-08 ENCOUNTER — Other Ambulatory Visit: Payer: Self-pay

## 2024-06-08 DIAGNOSIS — Z853 Personal history of malignant neoplasm of breast: Secondary | ICD-10-CM

## 2024-06-08 DIAGNOSIS — C50919 Malignant neoplasm of unspecified site of unspecified female breast: Secondary | ICD-10-CM

## 2024-06-08 NOTE — Progress Notes (Signed)
 [facility] [address], [place] [address]    Clinic Day:  06/09/2024  Referring physician: Shona Norleen PEDLAR, MD  Patient Care Team: Shona Norleen PEDLAR, MD as PCP - General (Internal Medicine) Burnard Debby LABOR, MD as PCP - Cardiology (Cardiology) Shaaron Lamar HERO, MD (Gastroenterology) Celestia Joesph SQUIBB, RN as Oncology Nurse Navigator (Oncology) Ramonita Suzen CROME, RN as [facility] Care Management Ramonita Suzen CROME, RN as [facility] Care Management   ASSESSMENT & PLAN:   Assessment: 1.  T2N0M1 left breast infiltrating lobular carcinoma: -Status post lumpectomy in June 2014, 1.4 cm, grade 1, lymphovascular invasion positive, ER 100%, PR 26%, Ki-67 18%. -She underwent XRT.  She did not take adjuvant endocrine therapy. -Mammogram on 07/13/2020, BI-RADS Category 1. -CEA was 10.3, Ca1 2515.8 and CA 19-9 05. -PET scan on 08/07/2020 shows widespread hypermetabolic bone meta stasis with soft tissue components at T1 and sacrum.  Thoracic nodal metastasis. -MRI of the thoracic spine on 08/15/2020 shows pathological fracture of T1 with mild osseous retropulsion and possible left C8 nerve root encroachment within the foramen at C7-T1.  No cord deformity. -Right third rib biopsy on 08/22/2020 consistent with metastatic breast cancer, ER 100% positive, PR 40% positive, Ki-67 10%, HER-2 2+ and negative by FISH. -Ibrance  100 mg 3 weeks on 1 week off on anastrozole  1 mg daily started on 08/31/2020. -Ibrance  held on 10/13/2020 secondary to severe tiredness. - PET scan on 02/19/2021 shows improvement in metastatic disease.   2.  Social/family history: -She quit smoking 20 years ago, smoked 2 packs/day for more than 20 years prior to quitting. -She lives at home by herself and is independent of all activities. -Daughter who has multiple myeloma at age 65.    Plan: 1.  Metastatic breast cancer to the bones, ER/PR positive, HER2 negative: - She is tolerating Ibrance   and anastrozole  reasonably well. - Chronic low back pain is stable. - We reviewed PET scan images from 05/20/2024: No evidence of hypermetabolic metastatic disease.  Scattered lytic/lucent lesions in the bones without hypermetabolism.  Other benign findings were discussed. - Labs: CA 15-3 [place]  [place] [address] is staying normal.  CBC shows normal white count, platelet count and hemoglobin.  LFTs are normal.  Mildly elevated creatinine is stable. - Recommend continuing Ibrance  75 mg 3 weeks on/1 week off and anastrozole  1 mg tablet daily until progression. - She will return to clinic in 3 months with repeat labs.  Will repeat imaging in 6 to 12 months based on labs and symptoms.   2.  Bone metastasis: - She is tolerating monthly denosumab  very well.  Calcium  level is 9.0 today.  Continue calcium  supplements.  She has been on it for few years.  Will change to every 12 weeks.   3.  Depression/anxiety: - Continue Lexapro  and clorazepate  for anxiety.  Symptoms are well-controlled.   4.  Diarrhea: - She has intermittent diarrhea.  Continue Imodium as needed.    Orders Placed This Encounter  Procedures   CBC with Differential    Standing Status:   Future    Expected Date:   09/06/2024    Expiration Date:   12/05/2024   Comprehensive metabolic panel    Standing Status:   Future    Expected Date:   09/06/2024    Expiration Date:   12/05/2024   Cancer antigen 15-3    Standing Status:   Future    Expected Date:   09/06/2024  Expiration Date:   12/05/2024   Cancer antigen 27.29    Standing Status:   Future    Expected Date:   09/06/2024    Expiration Date:   12/05/2024      Kara Woods,acting as a scribe for Alean Stands, MD.,have documented all relevant documentation on the behalf of Alean Stands, MD,as directed by  Alean Stands, MD while in the presence of Alean Stands, MD.  I, Alean Stands MD, have reviewed the above documentation for accuracy  and completeness, and I agree with the above.      Alean Stands, MD   7/16/20252:01 PM  CHIEF COMPLAINT:   Diagnosis: left breast cancer    Cancer Staging  History of breast cancer Staging form: Breast, AJCC 7th Edition - Clinical stage from 04/20/2013: Stage IIA (T2, N0, cM0) - Unsigned - Pathologic: No stage assigned - Unsigned    Prior Therapy: 1. Left lumpectomy in June 2014  2.  XRT  Current Therapy:  Ibrance  and anastrozole     HISTORY OF PRESENT ILLNESS:   Oncology History   No history exists.     INTERVAL HISTORY:   Kara Woods is a 89 y.o. female presenting to clinic today for follow up of left breast cancer. She was last seen by me on 02/17/2024.  Today, she states that she is doing well overall. Her appetite level is at 100%. Her energy level is at 75%.   PAST MEDICAL HISTORY:   Past Medical History: Past Medical History:  Diagnosis Date   Anxiety    Arthritis    Blind left eye    Breast cancer (HCC)    left    Colitis    Coronary artery disease    a. 10/2016 Staged PCI: RCA 50p, 86m (2.25x12 Resolute Onyx DES), LCX 50p, 27m (2.75x23 Xience Alpine DES). Residual LAD 50p/m, 69m/d.   Cystocele 10/11/2013   Depression    Diastolic dysfunction    a. 09/2016 Echo: EF 50%, diffuse hypokinesis, mild LVH, grade 1 diastolic dysfunction, mild mitral regurgitation, mildly dilated left atrium.   GERD (gastroesophageal reflux disease)    occasional Tums only   HOH (hard of hearing)    Pelvic relaxation 10/11/2013   Proctitis    colonsocopy 2007, Canasa  suppositories   S/P endoscopy March 2009   mild erosive reflux esophagitis, Schatzki's ring, s/p dilation   Schatzki's ring    Shortness of breath    occ if anxiety   Wears dentures    upper   Wears glasses     Surgical History: Past Surgical History:  Procedure Laterality Date   ABDOMINAL HYSTERECTOMY     ANTERIOR AND POSTERIOR REPAIR N/A 05/17/2014   Procedure: ANTERIOR (CYSTOCELE) AND  POSTERIOR REPAIR (RECTOCELE);  Surgeon: Glendia DELENA Elizabeth, MD;  Location: [facility];  Service: Urology;  Laterality: N/A;   APPENDECTOMY     BREAST LUMPECTOMY WITH NEEDLE LOCALIZATION AND AXILLARY SENTINEL LYMPH NODE BX Left 05/03/2013   Procedure: BREAST LUMPECTOMY WITH NEEDLE LOCALIZATION AND AXILLARY SENTINEL LYMPH NODE BX;  Surgeon: Morene ONEIDA Olives, MD;  Location: [facility];  Service: General;  Laterality: Left;   BREAST SURGERY Left 05/19/13   CARDIAC CATHETERIZATION  1998   CARDIAC CATHETERIZATION N/A 11/04/2016   Procedure: Left Heart Cath and Coronary Angiography;  Surgeon: Debby DELENA Sor, MD;  Location: [facility];  Service: Cardiovascular;  Laterality: N/A;   CARDIAC CATHETERIZATION N/A 11/05/2016   Procedure: Coronary Stent Intervention;  Surgeon: Debby DELENA Sor, MD;  Location: [facility];  Service: Cardiovascular;  Laterality: N/A;   CHOLECYSTECTOMY     COLONOSCOPY  05/06/2006   Diffuse inflammatory changes of the rectal mucosa, consistent  with proctitis.  Otherwise, normal colon to terminal ileum   CORONARY ANGIOPLASTY  11/04/2016   CORONARY STENT PLACEMENT     Drug-eluting coronary artery stent, non-bioabsorbable-polymer-coated   CYSTOSCOPY N/A 05/17/2014   Procedure: CYSTOSCOPY;  Surgeon: Glendia DELENA Elizabeth, MD;  Location: [facility];  Service: Urology;  Laterality: N/A;   ESOPHAGOGASTRODUODENOSCOPY  02/09/2008   Schatzki ring status post dilation/Distal esophageal erosion consistent with mild erosive reflux  esophagitis, otherwise unremarkable esophagus, normal stomach, D1, D2.   EYE SURGERY     lt cataract-implant   EYE SURGERY     lt-lens repaced,l   LUMBAR DISC SURGERY  1993   RE-EXCISION OF BREAST LUMPECTOMY Left 05/19/2013   Procedure: RE-EXCISION OF BREAST LUMPECTOMY;  Surgeon: Morene ONEIDA Olives, MD;  Location: [facility];  Service: General;  Laterality: Left;   RETINAL DETACHMENT SURGERY  1978   Left   VAGINAL HYSTERECTOMY  Bilateral 05/17/2014   Procedure: HYSTERECTOMY VAGINAL with Bilateral Salpingo-Oophorectomy; Bladder cystotomy repair;  Surgeon: Dickie DELENA Carder, MD;  Location: [facility];  Service: Gynecology;  Laterality: Bilateral;   VAGINAL PROLAPSE REPAIR N/A 05/17/2014   Procedure: VAGINAL VAULT PROLAPSE AND GRAFT;  Surgeon: Glendia DELENA Elizabeth, MD;  Location: [facility];  Service: Urology;  Laterality: N/A;    Social History: Social History   Socioeconomic History   Marital status: Widowed    Spouse name: Not on file   Number of children: 6   Years of education: Not on file   Highest education level: Not on file  Occupational History   Occupation: Retired  Tobacco Use   Smoking status: Former    Current packs/day: 0.00    Types: Cigarettes    Quit date: 04/28/1990    Years since quitting: 34.1   Smokeless tobacco: Never   Tobacco comments:    quit 28 yrs ago  Substance and Sexual Activity   Alcohol  use: Yes    Alcohol /week: 1.0 standard drink of alcohol     Types: 1 Glasses of wine per week    Comment: occ   Drug use: No   Sexual activity: Not Currently    Birth control/protection: Post-menopausal  Other Topics Concern   Not on file  Social History Narrative    02/14/23 lives at home by herself and is independent of all activities.   all her children live about a mile away. Daughter are handling her finances and also helping with her bills.    -Daughter who has multiple myeloma at age 92   Social Drivers of Health   Financial Resource Strain: Low Risk  (10/07/2022)   Overall Financial Resource Strain (CARDIA)    Difficulty of Paying Living Expenses: Not hard at all  Food Insecurity: No Food Insecurity (10/07/2022)   Hunger Vital Sign    Worried About Running Out of Food in the Last Year: Never true    Ran Out of Food in the Last Year: Never true  Transportation Needs: No Transportation Needs (10/07/2022)   PRAPARE - Administrator, Civil Service (Medical): No    Lack of  Transportation (Non-Medical): No  Physical Activity: Insufficiently Active (09/27/2020)   Exercise Vital Sign    Days of Exercise per Week: 2 days    Minutes of Exercise per Session: 10 min  Stress: No Stress Concern  Present (01/09/2023)   [employer] of Occupational Health - Occupational Stress Questionnaire    Feeling of Stress : Only a little  Social Connections: Socially Isolated (09/27/2020)   Social Connection and Isolation Panel    Frequency of Communication with Friends and Family: More than three times a week    Frequency of Social Gatherings with Friends and Family: More than three times a week    Attends Religious Services: Never    Database administrator or Organizations: No    Attends Banker Meetings: Never    Marital Status: Widowed  Intimate Partner Violence: Not At Risk (01/09/2023)   Humiliation, Afraid, Rape, and Kick questionnaire    Fear of Current or Ex-Partner: No    Emotionally Abused: No    Physically Abused: No    Sexually Abused: No    Family History: Family History  Problem Relation Age of Onset   Cancer Brother        spinal   Heart attack Mother    Heart attack Father    Heart attack Son    Heart attack Son    Depression Son    Multiple myeloma Daughter    Anemia Daughter    Colon cancer Neg Hx     Current Medications:  Current Outpatient Medications:    acetaminophen  (TYLENOL ) 325 MG tablet, Take 2 tablets (650 mg total) by mouth every 6 (six) hours as needed for mild pain or headache (or temp > 37.5 C (99.5 F))., Disp: 40 tablet, Rfl: 0   anastrozole  (ARIMIDEX ) 1 MG tablet, TAKE 1 TABLET BY MOUTH EVERY DAY, Disp: 90 tablet, Rfl: 2   aspirin  81 MG EC tablet, Take 1 tablet (81 mg total) by mouth daily. Swallow whole., Disp: 60 tablet, Rfl: 2   Calcium -Phosphorus-Vitamin D (CALCIUM  GUMMIES PO), Take by mouth 2 (two) times daily., Disp: , Rfl:    carboxymethylcellulose (REFRESH PLUS) 0.5 % SOLN, Place 3-4 drops into both eyes 3  (three) times daily as needed (dry eyes). Preservative free, Disp: , Rfl:    clorazepate  (TRANXENE ) 3.75 MG tablet, Take 1 tablet (3.75 mg total) by mouth 2 (two) times daily as needed for anxiety., Disp: 60 tablet, Rfl: 0   escitalopram  (LEXAPRO ) 20 MG tablet, TAKE 1 TABLET BY MOUTH EVERY DAY, Disp: 90 tablet, Rfl: 2   latanoprost  (XALATAN ) 0.005 % ophthalmic solution, Place 1 drop into both eyes at bedtime., Disp: , Rfl:    metoprolol  tartrate (LOPRESSOR ) 25 MG tablet, Take 1 tablet (25 mg total) by mouth 2 (two) times daily., Disp: 180 tablet, Rfl: 3   nitroGLYCERIN  (NITROSTAT ) 0.4 MG SL tablet, Place 1 tablet (0.4 mg total) under the tongue every 5 (five) minutes as needed., Disp: 25 tablet, Rfl: 3   OLANZapine  (ZYPREXA ) 2.5 MG tablet, Take 1 tablet (2.5 mg total) by mouth 2 (two) times daily. And an additional tab in the morning, Disp: 180 tablet, Rfl: 4   palbociclib  (IBRANCE ) 75 MG tablet, Take 1 tablet (75 mg total) by mouth daily. Take for 21 days on, 7 days off, repeat every 28 days., Disp: 21 tablet, Rfl: 4   Allergies: Allergies  Allergen Reactions   Contrast Media [Iodinated Contrast Media] Anaphylaxis    Adverse reaction; pt hyperventilating, c/o CP and SOB    Lipitor  [Atorvastatin ] Other (See Comments)    Muscle aches, cramps, fatigue, weight loss/poor appetite   Sulfamethoxazole Other (See Comments)    Reaction was years ago unknown   Sulfonamide Derivatives Other (  See Comments)    Dizziness     REVIEW OF SYSTEMS:   Review of Systems  Constitutional:  Negative for chills, fatigue and fever.  HENT:   Negative for lump/mass, mouth sores, nosebleeds, sore throat and trouble swallowing.   Eyes:  Negative for eye problems.  Respiratory:  Positive for cough. Negative for shortness of breath.   Cardiovascular:  Negative for chest pain, leg swelling and palpitations.  Gastrointestinal:  Positive for constipation and diarrhea. Negative for abdominal pain, nausea and vomiting.   Genitourinary:  Negative for bladder incontinence, difficulty urinating, dysuria, frequency, hematuria and nocturia.   Musculoskeletal:  Negative for arthralgias, back pain, flank pain, myalgias and neck pain.  Skin:  Negative for itching and rash.  Neurological:  Negative for dizziness, headaches and numbness.  Hematological:  Does not bruise/bleed easily.  Psychiatric/Behavioral:  Negative for depression, sleep disturbance and suicidal ideas. The patient is not nervous/anxious.   All other systems reviewed and are negative.    VITALS:   There were no vitals taken for this visit.  Wt Readings from Last 3 Encounters:  02/17/24 157 lb 6.5 oz (71.4 kg)  01/12/24 155 lb (70.3 kg)  11/18/23 157 lb 6.4 oz (71.4 kg)    There is no height or weight on file to calculate BMI.  Performance status (ECOG): 1 - Symptomatic but completely ambulatory  PHYSICAL EXAM:   Physical Exam Vitals and nursing note reviewed. Exam conducted with a chaperone present.  Constitutional:      Appearance: Normal appearance.  Cardiovascular:     Rate and Rhythm: Normal rate and regular rhythm.     Pulses: Normal pulses.     Heart sounds: Normal heart sounds.  Pulmonary:     Effort: Pulmonary effort is normal.     Breath sounds: Normal breath sounds.  Abdominal:     Palpations: Abdomen is soft. There is no hepatomegaly, splenomegaly or mass.     Tenderness: There is no abdominal tenderness.  Musculoskeletal:     Right lower leg: No edema.     Left lower leg: No edema.  Lymphadenopathy:     Cervical: No cervical adenopathy.     Right cervical: No superficial, deep or posterior cervical adenopathy.    Left cervical: No superficial, deep or posterior cervical adenopathy.     Upper Body:     Right upper body: No supraclavicular or axillary adenopathy.     Left upper body: No supraclavicular or axillary adenopathy.  Neurological:     General: No focal deficit present.     Mental Status: She is alert and  oriented to person, place, and time.  Psychiatric:        Mood and Affect: Mood normal.        Behavior: Behavior normal.     LABS:      Latest Ref Rng & Units 06/09/2024   12:58 PM 05/12/2024   12:55 PM 04/14/2024   12:27 PM  CBC  WBC 4.0 - 10.5 K/uL 5.1  5.3  4.2   Hemoglobin 12.0 - 15.0 g/dL 87.8  87.6  88.1   Hematocrit 36.0 - 46.0 % 36.8  35.2  35.8   Platelets 150 - 400 K/uL 166  163  149       Latest Ref Rng & Units 06/09/2024   12:58 PM 05/12/2024   12:55 PM 04/14/2024   12:27 PM  CMP  Glucose 70 - 99 mg/dL 95  98  94   BUN 8 - 23  mg/dL 14  14  13    Creatinine 0.44 - 1.00 mg/dL 8.97  8.97  9.17   Sodium 135 - 145 mmol/L 138  138  137   Potassium 3.5 - 5.1 mmol/L 3.7  3.9  3.7   Chloride 98 - 111 mmol/L 100  106  105   CO2 22 - 32 mmol/L 26  22  27    Calcium  8.9 - 10.3 mg/dL 9.0  8.7  8.8   Total Protein 6.5 - 8.1 g/dL 7.0  7.3  6.9   Total Bilirubin 0.0 - 1.2 mg/dL 0.5  0.8  0.7   Alkaline Phos 38 - 126 U/L 45  39  40   AST 15 - 41 U/L 16  15  15    ALT 0 - 44 U/L 8  7  9       No results found for: CEA1, CEA / No results found for: CEA1, CEA No results found for: PSA1 Lab Results  Component Value Date   CAN199 6 10/09/2021   No results found for: CAN125  No results found for: TOTALPROTELP, ALBUMINELP, A1GS, A2GS, BETS, BETA2SER, GAMS, MSPIKE, SPEI No results found for: TIBC, FERRITIN, IRONPCTSAT No results found for: LDH   STUDIES:   NM PET Image Restage (PS) Skull Base to Thigh (F-18 FDG) Result Date: 05/26/2024 CLINICAL DATA:  Subsequent treatment strategy for breast cancer. EXAM: NUCLEAR MEDICINE PET SKULL BASE TO THIGH TECHNIQUE: 8.5 mCi F-18 FDG was injected intravenously. Full-ring PET imaging was performed from the skull base to thigh after the radiotracer. CT data was obtained and used for attenuation correction and anatomic localization. Fasting blood glucose: 103 mg/dl COMPARISON:  87/80/7975 FINDINGS:  Mediastinal blood pool activity: SUV max 2.5 Liver activity: SUV max NA NECK: No hypermetabolic lymph nodes in the neck. Incidental CT findings: None. CHEST: No hypermetabolic mediastinal or hilar nodes. No suspicious pulmonary nodules on the CT scan. Incidental CT findings: Coronary artery calcification is evident. Moderate atherosclerotic calcification is noted in the wall of the thoracic aorta. Enlargement of the pulmonary outflow tract and main pulmonary arteries suggests pulmonary arterial hypertension. Centrilobular emphysema. ABDOMEN/PELVIS: No abnormal hypermetabolic activity within the liver, pancreas, adrenal glands, or spleen. No hypermetabolic lymph nodes in the abdomen or pelvis. Incidental CT findings: Cholecystectomy. There is moderate atherosclerotic calcification of the abdominal aorta without aneurysm. SKELETON: No focal hypermetabolic activity to suggest skeletal metastasis. Incidental CT findings: Scattered lytic/lucent lesions identified in the left clavicle, lower cervical spine, thoracolumbar spine, and left femoral head are stable in the interval without hypermetabolism. IMPRESSION: 1. No evidence for hypermetabolic metastatic disease in the neck, chest, abdomen, or pelvis. 2. Stable scattered lytic/lucent lesions in the left clavicle, lower cervical spine, thoracolumbar spine, and left femoral head without hypermetabolism. Findings remain compatible with treated metastases. 3. Enlargement of the pulmonary outflow tract and main pulmonary arteries suggests pulmonary arterial hypertension. 4. Aortic Atherosclerosis (ICD10-I70.0) and Emphysema (ICD10-J43.9). Electronically Signed   By: Camellia Candle M.D.   On: 05/26/2024 05:40

## 2024-06-09 ENCOUNTER — Inpatient Hospital Stay: Attending: Hematology

## 2024-06-09 ENCOUNTER — Inpatient Hospital Stay (HOSPITAL_BASED_OUTPATIENT_CLINIC_OR_DEPARTMENT_OTHER): Admitting: Hematology

## 2024-06-09 ENCOUNTER — Inpatient Hospital Stay

## 2024-06-09 DIAGNOSIS — C50212 Malignant neoplasm of upper-inner quadrant of left female breast: Secondary | ICD-10-CM | POA: Diagnosis not present

## 2024-06-09 DIAGNOSIS — Z79899 Other long term (current) drug therapy: Secondary | ICD-10-CM | POA: Insufficient documentation

## 2024-06-09 DIAGNOSIS — Z853 Personal history of malignant neoplasm of breast: Secondary | ICD-10-CM

## 2024-06-09 DIAGNOSIS — Z807 Family history of other malignant neoplasms of lymphoid, hematopoietic and related tissues: Secondary | ICD-10-CM | POA: Insufficient documentation

## 2024-06-09 DIAGNOSIS — R197 Diarrhea, unspecified: Secondary | ICD-10-CM | POA: Diagnosis not present

## 2024-06-09 DIAGNOSIS — Z923 Personal history of irradiation: Secondary | ICD-10-CM | POA: Diagnosis not present

## 2024-06-09 DIAGNOSIS — Z87891 Personal history of nicotine dependence: Secondary | ICD-10-CM | POA: Insufficient documentation

## 2024-06-09 DIAGNOSIS — C50919 Malignant neoplasm of unspecified site of unspecified female breast: Secondary | ICD-10-CM | POA: Diagnosis not present

## 2024-06-09 DIAGNOSIS — F32A Depression, unspecified: Secondary | ICD-10-CM | POA: Diagnosis not present

## 2024-06-09 DIAGNOSIS — F419 Anxiety disorder, unspecified: Secondary | ICD-10-CM | POA: Diagnosis not present

## 2024-06-09 DIAGNOSIS — G8929 Other chronic pain: Secondary | ICD-10-CM | POA: Diagnosis not present

## 2024-06-09 DIAGNOSIS — M545 Low back pain, unspecified: Secondary | ICD-10-CM | POA: Diagnosis not present

## 2024-06-09 DIAGNOSIS — C7951 Secondary malignant neoplasm of bone: Secondary | ICD-10-CM

## 2024-06-09 DIAGNOSIS — Z79811 Long term (current) use of aromatase inhibitors: Secondary | ICD-10-CM | POA: Insufficient documentation

## 2024-06-09 LAB — COMPREHENSIVE METABOLIC PANEL WITH GFR
ALT: 8 U/L (ref 0–44)
AST: 16 U/L (ref 15–41)
Albumin: 3.7 g/dL (ref 3.5–5.0)
Alkaline Phosphatase: 45 U/L (ref 38–126)
Anion gap: 12 (ref 5–15)
BUN: 14 mg/dL (ref 8–23)
CO2: 26 mmol/L (ref 22–32)
Calcium: 9 mg/dL (ref 8.9–10.3)
Chloride: 100 mmol/L (ref 98–111)
Creatinine, Ser: 1.02 mg/dL — ABNORMAL HIGH (ref 0.44–1.00)
GFR, Estimated: 53 mL/min — ABNORMAL LOW (ref >60)
Glucose, Bld: 95 mg/dL (ref 70–99)
Potassium: 3.7 mmol/L (ref 3.5–5.1)
Sodium: 138 mmol/L (ref 135–145)
Total Bilirubin: 0.5 mg/dL (ref 0.0–1.2)
Total Protein: 7 g/dL (ref 6.5–8.1)

## 2024-06-09 LAB — CBC WITH DIFFERENTIAL/PLATELET
Abs Immature Granulocytes: 0 K/uL (ref 0.00–0.07)
Basophils Absolute: 0 K/uL (ref 0.0–0.1)
Basophils Relative: 0 %
Eosinophils Absolute: 0.1 K/uL (ref 0.0–0.5)
Eosinophils Relative: 2 %
HCT: 36.8 % (ref 36.0–46.0)
Hemoglobin: 12.1 g/dL (ref 12.0–15.0)
Lymphocytes Relative: 40 %
Lymphs Abs: 2 K/uL (ref 0.7–4.0)
MCH: 35.5 pg — ABNORMAL HIGH (ref 26.0–34.0)
MCHC: 32.9 g/dL (ref 30.0–36.0)
MCV: 107.9 fL — ABNORMAL HIGH (ref 80.0–100.0)
Monocytes Absolute: 0.1 K/uL (ref 0.1–1.0)
Monocytes Relative: 2 %
Neutro Abs: 2.9 K/uL (ref 1.7–7.7)
Neutrophils Relative %: 56 %
Platelets: 166 K/uL (ref 150–400)
RBC: 3.41 MIL/uL — ABNORMAL LOW (ref 3.87–5.11)
RDW: 13.5 % (ref 11.5–15.5)
WBC: 5.1 K/uL (ref 4.0–10.5)
nRBC: 0 % (ref 0.0–0.2)

## 2024-06-09 MED ORDER — DENOSUMAB 120 MG/1.7ML ~~LOC~~ SOLN
120.0000 mg | Freq: Once | SUBCUTANEOUS | Status: AC
Start: 2024-06-09 — End: 2024-06-09
  Administered 2024-06-09: 120 mg via SUBCUTANEOUS
  Filled 2024-06-09: qty 1.7

## 2024-06-09 NOTE — Progress Notes (Signed)
 Kara Woods presents today for Xgeva  injection per the provider's orders.  Patient has been taking Calcium /vitamin D supplements has had no jaw pain or dental work prior or up coming.  Stable during administration without incident; injection site WNL; see MAR for injection details.  Patient tolerated procedure well and without incident.  No questions or complaints noted at this time.

## 2024-06-09 NOTE — Patient Instructions (Addendum)
 [facility] at [facility] Discharge Instructions   You were seen and examined today by Dr. Rogers.  He reviewed the results of your lab work which are normal/stable.   He reviewed the results of your PET scan which did not show any evidence of cancer.   Continue Ibrance  and anastrozole  as prescribed.   We will continue the Xgeva  (bone shot) but will change it to every 3 months.   We will see you back in 3 months. We will repeat lab work prior to this visit.    Return as scheduled.    Thank you for choosing [facility] at [facility] to provide your oncology and hematology care.  To afford each patient quality time with our provider, please arrive at least 15 minutes before your scheduled appointment time.   If you have a lab appointment with the Cancer Center please come in thru the Main Entrance and check in at the main information desk.  You need to re-schedule your appointment should you arrive 10 or more minutes late.  We strive to give you quality time with our providers, and arriving late affects you and other patients whose appointments are after yours.  Also, if you no show three or more times for appointments you may be dismissed from the clinic at the providers discretion.     Again, thank you for choosing [facility].  Our hope is that these requests will decrease the amount of time that you wait before being seen by our physicians.       _____________________________________________________________  Should you have questions after your visit to [facility], please contact our office at [phone] and follow the prompts.  Our office hours are 8:00 a.m. and 4:30 p.m. Monday - Friday.  Please note that voicemails left after 4:00 p.m. may not be returned until the following business day.  We are closed weekends and major holidays.  You do have access to a nurse 24-7, just call the main number to the  clinic [phone] and do not press any options, hold on the line and a nurse will answer the phone.    For prescription refill requests, have your pharmacy contact our office and allow 72 hours.    Due to Covid, you will need to wear a mask upon entering the hospital. If you do not have a mask, a mask will be given to you at the Main Entrance upon arrival. For doctor visits, patients may have 1 support person age 30 or older with them. For treatment visits, patients can not have anyone with them due to social distancing guidelines and our immunocompromised population.

## 2024-06-09 NOTE — Progress Notes (Signed)
Patient is taking Ibrance as prescribed.  She has not missed any doses and reports no side effects at this time.   

## 2024-06-09 NOTE — Patient Instructions (Signed)
 [facility] [place] - A DEPT OF [facility]  Discharge Instructions: Thank you for choosing [facility] to provide your oncology and hematology care.  If you have a lab appointment with the Cancer Center - please note that after April 8th, 2024, all labs will be drawn in the cancer center.  You do not have to check in or register with the main entrance as you have in the past but will complete your check-in in the cancer center.  Wear comfortable clothing and clothing appropriate for easy access to any Portacath or PICC line.   We strive to give you quality time with your provider. You may need to reschedule your appointment if you arrive late (15 or more minutes).  Arriving late affects you and other patients whose appointments are after yours.  Also, if you miss three or more appointments without notifying the office, you may be dismissed from the clinic at the provider's discretion.      For prescription refill requests, have your pharmacy contact our office and allow 72 hours for refills to be completed.    Today you received the following chemotherapy and/or immunotherapy agents Xgeva      To help prevent nausea and vomiting after your treatment, we encourage you to take your nausea medication as directed.  BELOW ARE SYMPTOMS THAT SHOULD BE REPORTED IMMEDIATELY: *FEVER GREATER THAN 100.4 F (38 C) OR HIGHER *CHILLS OR SWEATING *NAUSEA AND VOMITING THAT IS NOT CONTROLLED WITH YOUR NAUSEA MEDICATION *UNUSUAL SHORTNESS OF BREATH *UNUSUAL BRUISING OR BLEEDING *URINARY PROBLEMS (pain or burning when urinating, or frequent urination) *BOWEL PROBLEMS (unusual diarrhea, constipation, pain near the anus) TENDERNESS IN MOUTH AND THROAT WITH OR WITHOUT PRESENCE OF ULCERS (sore throat, sores in mouth, or a toothache) UNUSUAL RASH, SWELLING OR PAIN  UNUSUAL VAGINAL DISCHARGE OR ITCHING   Items with * indicate a potential emergency and should be followed up as  soon as possible or go to the Emergency Department if any problems should occur.  Please show the CHEMOTHERAPY ALERT CARD or IMMUNOTHERAPY ALERT CARD at check-in to the Emergency Department and triage nurse.  Should you have questions after your visit or need to cancel or reschedule your appointment, please contact [facility] [place] - A DEPT OF Eligha Brideg[facility] [phone]  and follow the prompts.  Office hours are 8:00 a.m. to 4:30 p.m. Monday - Friday. Please note that voicemails left after 4:00 p.m. may not be returned until the following business day.  We are closed weekends and major holidays. You have access to a nurse at all times for urgent questions. Please call the main number to the clinic [phone] and follow the prompts.  For any non-urgent questions, you may also contact your provider using MyChart. We now offer e-Visits for anyone 65 and older to request care online for non-urgent symptoms. For details visit [email or web address].   Also download the MyChart app! Go to the app store, search "MyChart", open the app, select [place], and log in with your MyChart username and password.

## 2024-06-10 LAB — CANCER ANTIGEN 27.29: CA 27.29: 29.5 U/mL (ref 0.0–38.6)

## 2024-06-10 LAB — CANCER ANTIGEN 15-3: CA 15-3: 24.3 U/mL (ref 0.0–25.0)

## 2024-06-30 ENCOUNTER — Other Ambulatory Visit: Payer: Self-pay | Admitting: Oncology

## 2024-06-30 ENCOUNTER — Other Ambulatory Visit (HOSPITAL_COMMUNITY): Payer: Self-pay

## 2024-06-30 ENCOUNTER — Other Ambulatory Visit: Payer: Self-pay | Admitting: Pharmacy Technician

## 2024-06-30 ENCOUNTER — Encounter (INDEPENDENT_AMBULATORY_CARE_PROVIDER_SITE_OTHER): Payer: Self-pay

## 2024-06-30 ENCOUNTER — Other Ambulatory Visit: Payer: Self-pay

## 2024-06-30 DIAGNOSIS — C50212 Malignant neoplasm of upper-inner quadrant of left female breast: Secondary | ICD-10-CM

## 2024-06-30 MED ORDER — PALBOCICLIB 75 MG PO TABS
75.0000 mg | ORAL_TABLET | Freq: Every day | ORAL | 4 refills | Status: DC
  Filled 2024-06-30: qty 21, 28d supply, fill #0
  Filled 2024-08-02: qty 21, 28d supply, fill #1
  Filled 2024-09-02: qty 21, 28d supply, fill #2
  Filled 2024-09-23 – 2024-09-24 (×2): qty 21, 28d supply, fill #3
  Filled 2024-10-19: qty 21, 28d supply, fill #4

## 2024-06-30 NOTE — Telephone Encounter (Signed)
 Refill for Ibrance  approved.  Patient is tolerating and is to continue therapy.

## 2024-06-30 NOTE — Progress Notes (Signed)
 Specialty Pharmacy Refill Coordination Note  Kara Woods is a 89 y.o. female contacted today regarding refills of specialty medication(s) Palbociclib  (IBRANCE )   Spoke with Daughter [name]  Patient requested (Patient-Rptd) Delivery   Delivery date: 07/13/24 Verified address: (Patient-Rptd) [address] [place]   Medication will be filled on 07/12/24.   New cycle start 07/18/24.  RR sent to MD Davonna.

## 2024-07-12 ENCOUNTER — Other Ambulatory Visit: Payer: Self-pay

## 2024-07-13 ENCOUNTER — Other Ambulatory Visit: Payer: Self-pay

## 2024-07-13 NOTE — Progress Notes (Signed)
 Specialty Pharmacy Ongoing Clinical Assessment Note  Kara Woods is a 89 y.o. female who is being followed by the specialty pharmacy service for RxSp Oncology   Patient's specialty medication(s) reviewed today: No data recorded  Missed doses in the last 4 weeks: 0   Patient/Caregiver did not have any additional questions or concerns.   Therapeutic benefit summary: Patient is achieving benefit   Adverse events/side effects summary: No adverse events/side effects   Patient's therapy is appropriate to: Continue    Goals Addressed             This Visit's Progress    Slow Disease Progression   On track    Patient is on track. Patient will maintain adherence. Per 06/09/24 visit notes, CA 15-3 has normalized, CA 27.29 remains normal, PET Scan from 05/20/24 did not show hypermetabolic metastatic disease.          Follow up: 6 months  Sharyah Bostwick M Ahja Martello Specialty Pharmacist

## 2024-08-02 ENCOUNTER — Other Ambulatory Visit (HOSPITAL_COMMUNITY): Payer: Self-pay

## 2024-08-02 ENCOUNTER — Other Ambulatory Visit: Payer: Self-pay

## 2024-08-02 ENCOUNTER — Encounter (INDEPENDENT_AMBULATORY_CARE_PROVIDER_SITE_OTHER): Payer: Self-pay

## 2024-08-02 NOTE — Progress Notes (Signed)
 Encounter opened in error.

## 2024-08-02 NOTE — Progress Notes (Signed)
 Specialty Pharmacy Refill Coordination Note  MyChart Questionnaire Submission  Kara Woods is a 89 y.o. female contacted today regarding refills of specialty medication(s) Ibrance .  Doses on hand: (Patient-Rptd) 7   Next cycle: 08/16/24  Patient requested: (Patient-Rptd) Delivery   Delivery date: 08/04/24  Verified address: [address] [place] [address]  Medication will be filled on 08/03/24.

## 2024-08-03 ENCOUNTER — Other Ambulatory Visit (HOSPITAL_COMMUNITY): Payer: Self-pay

## 2024-08-24 ENCOUNTER — Other Ambulatory Visit: Payer: Self-pay

## 2024-09-02 ENCOUNTER — Inpatient Hospital Stay: Attending: Hematology

## 2024-09-02 ENCOUNTER — Other Ambulatory Visit: Payer: Self-pay

## 2024-09-02 ENCOUNTER — Encounter (INDEPENDENT_AMBULATORY_CARE_PROVIDER_SITE_OTHER): Payer: Self-pay

## 2024-09-02 ENCOUNTER — Other Ambulatory Visit (HOSPITAL_COMMUNITY): Payer: Self-pay

## 2024-09-02 DIAGNOSIS — Z807 Family history of other malignant neoplasms of lymphoid, hematopoietic and related tissues: Secondary | ICD-10-CM | POA: Insufficient documentation

## 2024-09-02 DIAGNOSIS — F419 Anxiety disorder, unspecified: Secondary | ICD-10-CM | POA: Insufficient documentation

## 2024-09-02 DIAGNOSIS — C7951 Secondary malignant neoplasm of bone: Secondary | ICD-10-CM | POA: Insufficient documentation

## 2024-09-02 DIAGNOSIS — Z23 Encounter for immunization: Secondary | ICD-10-CM | POA: Insufficient documentation

## 2024-09-02 DIAGNOSIS — Z79899 Other long term (current) drug therapy: Secondary | ICD-10-CM | POA: Insufficient documentation

## 2024-09-02 DIAGNOSIS — R197 Diarrhea, unspecified: Secondary | ICD-10-CM | POA: Insufficient documentation

## 2024-09-02 DIAGNOSIS — F32A Depression, unspecified: Secondary | ICD-10-CM | POA: Insufficient documentation

## 2024-09-02 DIAGNOSIS — C50212 Malignant neoplasm of upper-inner quadrant of left female breast: Secondary | ICD-10-CM | POA: Insufficient documentation

## 2024-09-02 DIAGNOSIS — Z87891 Personal history of nicotine dependence: Secondary | ICD-10-CM | POA: Insufficient documentation

## 2024-09-02 DIAGNOSIS — Z79811 Long term (current) use of aromatase inhibitors: Secondary | ICD-10-CM | POA: Insufficient documentation

## 2024-09-02 DIAGNOSIS — Z923 Personal history of irradiation: Secondary | ICD-10-CM | POA: Insufficient documentation

## 2024-09-02 NOTE — Progress Notes (Signed)
 Specialty Pharmacy Refill Coordination Note  MyChart Questionnaire Submission  Kara Woods is a 89 y.o. female contacted today regarding refills of specialty medication(s) Ibrance .  Doses on hand: (Patient-Rptd) un sure   Patient requested: (Patient-Rptd) Delivery   Delivery date: 09/03/24  Verified address: [address] [place] [address]  Medication will be filled on 09/02/24.

## 2024-09-08 ENCOUNTER — Inpatient Hospital Stay

## 2024-09-08 DIAGNOSIS — Z807 Family history of other malignant neoplasms of lymphoid, hematopoietic and related tissues: Secondary | ICD-10-CM | POA: Diagnosis not present

## 2024-09-08 DIAGNOSIS — Z79899 Other long term (current) drug therapy: Secondary | ICD-10-CM | POA: Diagnosis not present

## 2024-09-08 DIAGNOSIS — Z923 Personal history of irradiation: Secondary | ICD-10-CM | POA: Diagnosis not present

## 2024-09-08 DIAGNOSIS — C50212 Malignant neoplasm of upper-inner quadrant of left female breast: Secondary | ICD-10-CM | POA: Diagnosis not present

## 2024-09-08 DIAGNOSIS — F419 Anxiety disorder, unspecified: Secondary | ICD-10-CM | POA: Diagnosis not present

## 2024-09-08 DIAGNOSIS — R197 Diarrhea, unspecified: Secondary | ICD-10-CM | POA: Diagnosis not present

## 2024-09-08 DIAGNOSIS — Z79811 Long term (current) use of aromatase inhibitors: Secondary | ICD-10-CM | POA: Diagnosis not present

## 2024-09-08 DIAGNOSIS — C7951 Secondary malignant neoplasm of bone: Secondary | ICD-10-CM | POA: Diagnosis not present

## 2024-09-08 DIAGNOSIS — F32A Depression, unspecified: Secondary | ICD-10-CM | POA: Diagnosis not present

## 2024-09-08 DIAGNOSIS — Z23 Encounter for immunization: Secondary | ICD-10-CM | POA: Diagnosis not present

## 2024-09-08 DIAGNOSIS — Z87891 Personal history of nicotine dependence: Secondary | ICD-10-CM | POA: Diagnosis not present

## 2024-09-08 LAB — CBC WITH DIFFERENTIAL/PLATELET
Basophils Absolute: 0.2 K/uL — ABNORMAL HIGH (ref 0.0–0.1)
Basophils Relative: 5 %
Eosinophils Absolute: 0.1 K/uL (ref 0.0–0.5)
Eosinophils Relative: 3 %
HCT: 37.1 % (ref 36.0–46.0)
Hemoglobin: 12.5 g/dL (ref 12.0–15.0)
Lymphocytes Relative: 43 %
Lymphs Abs: 1.9 K/uL (ref 0.7–4.0)
MCH: 35.9 pg — ABNORMAL HIGH (ref 26.0–34.0)
MCHC: 33.7 g/dL (ref 30.0–36.0)
MCV: 106.6 fL — ABNORMAL HIGH (ref 80.0–100.0)
Monocytes Absolute: 0.1 K/uL (ref 0.1–1.0)
Monocytes Relative: 3 %
Neutro Abs: 2.1 K/uL (ref 1.7–7.7)
Neutrophils Relative %: 46 %
Platelets: 145 K/uL — ABNORMAL LOW (ref 150–400)
RBC: 3.48 MIL/uL — ABNORMAL LOW (ref 3.87–5.11)
RDW: 14 % (ref 11.5–15.5)
Smear Review: NORMAL
WBC: 4.5 K/uL (ref 4.0–10.5)
nRBC: 0 % (ref 0.0–0.2)

## 2024-09-08 LAB — COMPREHENSIVE METABOLIC PANEL WITH GFR
ALT: 5 U/L (ref 0–44)
AST: 19 U/L (ref 15–41)
Albumin: 4.1 g/dL (ref 3.5–5.0)
Alkaline Phosphatase: 47 U/L (ref 38–126)
Anion gap: 10 (ref 5–15)
BUN: 11 mg/dL (ref 8–23)
CO2: 26 mmol/L (ref 22–32)
Calcium: 9.1 mg/dL (ref 8.9–10.3)
Chloride: 103 mmol/L (ref 98–111)
Creatinine, Ser: 0.87 mg/dL (ref 0.44–1.00)
GFR, Estimated: >60 mL/min (ref >60)
Glucose, Bld: 91 mg/dL (ref 70–99)
Potassium: 4.1 mmol/L (ref 3.5–5.1)
Sodium: 140 mmol/L (ref 135–145)
Total Bilirubin: 0.5 mg/dL (ref 0.0–1.2)
Total Protein: 7.4 g/dL (ref 6.5–8.1)

## 2024-09-09 ENCOUNTER — Inpatient Hospital Stay: Admitting: Oncology

## 2024-09-09 ENCOUNTER — Inpatient Hospital Stay

## 2024-09-09 LAB — CANCER ANTIGEN 15-3: CA 15-3: 35 U/mL — ABNORMAL HIGH (ref 0.0–25.0)

## 2024-09-09 LAB — CANCER ANTIGEN 27.29: CA 27.29: 60.6 U/mL — ABNORMAL HIGH (ref 0.0–38.6)

## 2024-09-15 ENCOUNTER — Inpatient Hospital Stay: Admitting: Oncology

## 2024-09-15 ENCOUNTER — Encounter: Payer: Self-pay | Admitting: Oncology

## 2024-09-15 ENCOUNTER — Inpatient Hospital Stay

## 2024-09-15 VITALS — BP 133/64 | HR 72 | Temp 98.4°F | Resp 18 | Ht 66.0 in | Wt 144.0 lb

## 2024-09-15 DIAGNOSIS — C7951 Secondary malignant neoplasm of bone: Secondary | ICD-10-CM | POA: Diagnosis not present

## 2024-09-15 DIAGNOSIS — Z170 Estrogen receptor positive status [ER+]: Secondary | ICD-10-CM | POA: Diagnosis not present

## 2024-09-15 DIAGNOSIS — Z23 Encounter for immunization: Secondary | ICD-10-CM

## 2024-09-15 DIAGNOSIS — F419 Anxiety disorder, unspecified: Secondary | ICD-10-CM | POA: Diagnosis not present

## 2024-09-15 DIAGNOSIS — C50919 Malignant neoplasm of unspecified site of unspecified female breast: Secondary | ICD-10-CM | POA: Diagnosis not present

## 2024-09-15 DIAGNOSIS — Z79811 Long term (current) use of aromatase inhibitors: Secondary | ICD-10-CM | POA: Diagnosis not present

## 2024-09-15 DIAGNOSIS — C50212 Malignant neoplasm of upper-inner quadrant of left female breast: Secondary | ICD-10-CM

## 2024-09-15 DIAGNOSIS — Z853 Personal history of malignant neoplasm of breast: Secondary | ICD-10-CM

## 2024-09-15 DIAGNOSIS — F32A Depression, unspecified: Secondary | ICD-10-CM | POA: Diagnosis not present

## 2024-09-15 MED ORDER — INFLUENZA VAC SPLIT HIGH-DOSE 0.5 ML IM SUSY
0.5000 mL | PREFILLED_SYRINGE | Freq: Once | INTRAMUSCULAR | Status: AC
  Administered 2024-09-15: 0.5 mL via INTRAMUSCULAR
  Filled 2024-09-15: qty 0.5

## 2024-09-15 MED ORDER — DENOSUMAB 120 MG/1.7ML ~~LOC~~ SOLN
120.0000 mg | Freq: Once | SUBCUTANEOUS | Status: AC
  Administered 2024-09-15: 120 mg via SUBCUTANEOUS
  Filled 2024-09-15: qty 1.7

## 2024-09-15 NOTE — Patient Instructions (Signed)
 [facility] [place] - A DEPT OF [facility]  Discharge Instructions: Thank you for choosing [facility] to provide your oncology and hematology care.  If you have a lab appointment with the Cancer Center - please note that after April 8th, 2024, all labs will be drawn in the cancer center.  You do not have to check in or register with the main entrance as you have in the past but will complete your check-in in the cancer center.  Wear comfortable clothing and clothing appropriate for easy access to any Portacath or PICC line.   We strive to give you quality time with your provider. You may need to reschedule your appointment if you arrive late (15 or more minutes).  Arriving late affects you and other patients whose appointments are after yours.  Also, if you miss three or more appointments without notifying the office, you may be dismissed from the clinic at the provider's discretion.      For prescription refill requests, have your pharmacy contact our office and allow 72 hours for refills to be completed.    Today you received the following chemotherapy and/or immunotherapy agents Xgeva /influenza vaccination      To help prevent nausea and vomiting after your treatment, we encourage you to take your nausea medication as directed.  BELOW ARE SYMPTOMS THAT SHOULD BE REPORTED IMMEDIATELY: *FEVER GREATER THAN 100.4 F (38 C) OR HIGHER *CHILLS OR SWEATING *NAUSEA AND VOMITING THAT IS NOT CONTROLLED WITH YOUR NAUSEA MEDICATION *UNUSUAL SHORTNESS OF BREATH *UNUSUAL BRUISING OR BLEEDING *URINARY PROBLEMS (pain or burning when urinating, or frequent urination) *BOWEL PROBLEMS (unusual diarrhea, constipation, pain near the anus) TENDERNESS IN MOUTH AND THROAT WITH OR WITHOUT PRESENCE OF ULCERS (sore throat, sores in mouth, or a toothache) UNUSUAL RASH, SWELLING OR PAIN  UNUSUAL VAGINAL DISCHARGE OR ITCHING   Items with * indicate a potential emergency and  should be followed up as soon as possible or go to the Emergency Department if any problems should occur.  Please show the CHEMOTHERAPY ALERT CARD or IMMUNOTHERAPY ALERT CARD at check-in to the Emergency Department and triage nurse.  Should you have questions after your visit or need to cancel or reschedule your appointment, please contact [facility]ER CTR [place] - A DEPT OF [name] [phone]  and follow the prompts.  Office hours are 8:00 a.m. to 4:30 p.m. Monday - Friday. Please note that voicemails left after 4:00 p.m. may not be returned until the following business day.  We are closed weekends and major holidays. You have access to a nurse at all times for urgent questions. Please call the main number to the clinic [phone] and follow the prompts.  For any non-urgent questions, you may also contact your provider using MyChart. We now offer e-Visits for anyone 24 and older to request care online for non-urgent symptoms. For details visit [email or web address].   Also download the MyChart app! Go to the app store, search MyChart, open the app, select [place], and log in with your MyChart username and password.

## 2024-09-15 NOTE — Progress Notes (Signed)
 Kara Woods presents today for Xgeva /influenza vaccination per the provider's orders.  Message received from Dr. Davonna patient okay for injections.  Stable during administration without incident; injection sites WNL; see MAR for injection details.  Patient tolerated procedure well and without incident.  No questions or complaints noted at this time.

## 2024-09-15 NOTE — Patient Instructions (Signed)
 [facility] at [facility] Discharge Instructions   You were seen and examined today by Dr. Davonna.  She reviewed the results of your lab work which are normal/stable.   We will see you back in 3 months. We will repeat lab work and a PET scan prior to this visit.    Return as scheduled.    Thank you for choosing [facility] at [facility] to provide your oncology and hematology care.  To afford each patient quality time with our provider, please arrive at least 15 minutes before your scheduled appointment time.   If you have a lab appointment with the Cancer Center please come in thru the Main Entrance and check in at the main information desk.  You need to re-schedule your appointment should you arrive 10 or more minutes late.  We strive to give you quality time with our providers, and arriving late affects you and other patients whose appointments are after yours.  Also, if you no show three or more times for appointments you may be dismissed from the clinic at the providers discretion.     Again, thank you for choosing [facility].  Our hope is that these requests will decrease the amount of time that you wait before being seen by our physicians.       _____________________________________________________________  Should you have questions after your visit to [facility], please contact our office at [phone] and follow the prompts.  Our office hours are 8:00 a.m. and 4:30 p.m. Monday - Friday.  Please note that voicemails left after 4:00 p.m. may not be returned until the following business day.  We are closed weekends and major holidays.  You do have access to a nurse 24-7, just call the main number to the clinic [phone] and do not press any options, hold on the line and a nurse will answer the phone.    For prescription refill requests, have your pharmacy contact our office and allow 72 hours.    Due to  Covid, you will need to wear a mask upon entering the hospital. If you do not have a mask, a mask will be given to you at the Main Entrance upon arrival. For doctor visits, patients may have 1 support person age 30 or older with them. For treatment visits, patients can not have anyone with them due to social distancing guidelines and our immunocompromised population.

## 2024-09-15 NOTE — Progress Notes (Signed)
 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 female was transferred to my care after her prior physician has left.   ASSESSMENT & PLAN:   Assessment & Plan: Kara Woods  is a 89 y.o. 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  Patient did not tolerate endocrine therapy at that time. Recurrence in 2021 with elevated CEA and responded well to Ibrance  and anastrozole . Oncology history below Recent PET scan from 05/2024 with no evidence of metastatic disease but stable lytic bone lesions.  - We reviewed labs today: CMP: WNL, CBC: Normal WBC, normal hemoglobin, MCV: 106.6, platelets: 145 - Tolerating Ibrance  and anastrozole  well with no side effects. - Continue Ibrance  75 mg 3 weeks on/1 week off.  Continue anastrozole  1 mg daily - CA 15-3 and CA 27.29: Slightly elevated and uptrending. - Will repeat a PET scan in 3 months that is 11/2024  Return to clinic in 3 months with PET scan.  Metastatic disease to bone Currently on Xgeva  every 12 weeks  - Continue Xgeva  every 12 weeks - Continue vitamin D and calcium  supplementation  Depression/anxiety -Continue Lexapro  and clorazepate  for anxiety. Symptoms are well-controlled.   Diarrhea Patient had intermittent diarrhea previously.  Resolved  at this time.  The patient understands the plans discussed today and is in agreement with them.  She knows to contact our office if she develops concerns prior to her next appointment.  40 minutes of total time was spent for this patient encounter, including preparation,review of records,  face-to-face counseling with the patient and coordination of care, physical exam, and documentation of the encounter.   Kara Woods,acting as a neurosurgeon for Mickiel Dry, MD.,have documented all relevant documentation on the behalf of Mickiel Dry, MD,as directed by  Mickiel Dry, MD while in the presence of Mickiel Dry, MD.  I, Mickiel Dry MD, have reviewed the above documentation for accuracy and completeness, and I agree with the above.     Mickiel Dry, MD  [facility] [place] - A DEPT OF Kara Woods Dept: [phone] Dept Fax: [phone]   Orders Placed This Encounter  Procedures   NM PET Image Restag (PS) Skull Base To Thigh    Standing Status:   Future    Expected Date:   12/16/2024    Expiration Date:   09/15/2025    If indicated for the ordered procedure, I authorize the administration of a radiopharmaceutical per Radiology protocol:   Yes    Preferred imaging location?:   [facility]   CBC with Differential    Standing Status:   Future    Expected Date:   11/25/2024    Expiration Date:   02/23/2025   Comprehensive metabolic panel    Standing Status:   Future  Expected Date:   11/25/2024    Expiration Date:   02/23/2025   Cancer antigen 27.29    Standing Status:   Future    Expected Date:   11/25/2024    Expiration Date:   02/23/2025   Cancer antigen 15-3    Standing Status:   Future    Expected Date:   11/25/2024    Expiration Date:   02/23/2025     ONCOLOGY HISTORY:   I have reviewed her chart and materials related to her cancer extensively and collaborated history with  the patient. 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  Pathology: Invasive mammary carcinoma. Tumor cells are ER positive (100%) and PR positive (26%) by IHC. Tumor is Her2 negative by FISH. Ki-67 is 18%. -05/03/2013: Left lumpectomy.  Pathology: Invasive lobular carcinoma, 1.4 cm. Lymphovascular invasion identified. Invasive tumor is focally present at the inferior and posterior margins, and broadly present at the anterior margin. One axillary lymph node negative for metastatic carcinoma (0/1). Staging: pT1c, pN0. -05/19/2013: Re-excision of left lumpectomy.  Pathology: No residual carcinoma. Margins clear.  -05/2013??: XRT to left breast area -Patient did not take adjuvant endocrine therapy for poor tolerance -07/13/2020: Seen in clinic for elevated CEA. -07/13/2020: Diagnostic Bilateral Mammogram: No mammographic evidence of malignancy in the bilateral breasts or other finding to explain the patient's diffuse bilateral breast pain. -07/24/2020: MRI Brain: NED -08/07/2020: Initial PET: Widespread hypermetabolic osseous metastasis, including lesions with soft tissue components at T1 and the sacrum. Thoracic nodal metastasis. No other evidence of soft tissue metastasis. -08/15/2020: MRI Thoracic spine: As seen on recent PET-CT, there are multiple osseous metastases within the thoracic spine and posterior ribs bilaterally. Associated pathologic fracture of T1 with mild osseous retropulsion and possible left C8 nerve root encroachment within the foramen at C7-T1. No cord deformity. Mild pathologic fracture of the superior endplate of T7. No significant epidural or paraspinal tumor at the other levels. -08/15/2020: MRI Lumbar spine: Multilevel osseous metastases within the lumbar spine and upper pelvis as seen on recent PET-CT. No evidence of pathologic fracture or epidural  tumor. -08/22/2020: Right 3rd rib biopsy.  Pathology: Metastatic carcinoma, consistent with patient's clinical history of primary breast carcinoma. Tumor cells are Her2 equivocal (2+), ER positive (100%), and PR positive (40%) by IHC. Her2 negative by FISH. Ki-67 is 10%.   -08/28/2020: CA 15-3 elevated at 51.3. CA 27.29 elevated at 57.1.  -08/31/2020-current: Ibrance  100 mg 3 weeks on/1 week off and Anastrozole  1 mg daily, Ibrance  held on 10/13/2020 secondary to fatigue and low blood counts and subsequently dose reduced to 75 mg -10/12/2020-current: Xgeva  every 4 weeks, transitioned to every 12 weeks on 06/09/2024 -02/19/2021: PET: Interval response to therapy. Previous index skeletal lesions have decreased in size and degree of FDG uptake. Resolution of previous FDG avid mediastinal and left internal mammary lymph nodes. New small focal area of subpleural nodularity within the lateral right base exhibits mild FDG uptake. Cannot exclude new site of disease. -08/09/2021-current: PET scan show stable to no evidence of disease -2023-current: Cancer markers remain relatively stable, though range from the upper limit of normal to slightly elevated   Current Treatment:  Ibrance  75mg  3 weeks on/1 week off. Anastrozole  1mg  daily  INTERVAL HISTORY:   Discussed the use of AI scribe software for clinical note transcription with the patient, who gave verbal consent to proceed.  History of Present  Illness Akeila STELLAH DONOVAN is an 89 year old female who presents for a follow-up visit for her breast cancer and establish care with me.  She is accompanied by her daughter today.  She is currently undergoing treatment with Ibrance , which she takes three weeks on and one week off, and anastrozole , which she takes daily. She reports no problems with Ibrance , denies hot flashes and joint pains, and humorously notes that at her age, it would be surprising not to have any pains.   Her recent PET scan in June was  reported as good, and her blood work from last week showed slightly elevated cancer markers, which had previously occurred in 2024 without significant findings. Her most recent platelet count was 145.  She lives alone but is closely monitored by her family, who live nearby. Her daughter visits her daily for coffee and lunch, and she has siblings who also check in regularly. She has not driven in two years and does not cook as much, relying on her family for meals.   I have reviewed the past medical history, past surgical history, social history and family history with the patient and they are unchanged from previous note.  ALLERGIES:  is allergic to contrast media [iodinated contrast media], lipitor  [atorvastatin ], sulfamethoxazole, and sulfonamide derivatives.  MEDICATIONS:  Current Outpatient Medications  Medication Sig Dispense Refill   acetaminophen  (TYLENOL ) 325 MG tablet Take 2 tablets (650 mg total) by mouth every 6 (six) hours as needed for mild pain or headache (or temp > 37.5 C (99.5 F)). 40 tablet 0   anastrozole  (ARIMIDEX ) 1 MG tablet TAKE 1 TABLET BY MOUTH EVERY DAY 90 tablet 2   aspirin  81 MG EC tablet Take 1 tablet (81 mg total) by mouth daily. Swallow whole. 60 tablet 2   Calcium -Phosphorus-Vitamin D (CALCIUM  GUMMIES PO) Take by mouth 2 (two) times daily.     carboxymethylcellulose (REFRESH PLUS) 0.5 % SOLN Place 3-4 drops into both eyes 3 (three) times daily as needed (dry eyes). Preservative free     clorazepate  (TRANXENE ) 3.75 MG tablet Take 1 tablet (3.75 mg total) by mouth 2 (two) times daily as needed for anxiety. 60 tablet 0   escitalopram  (LEXAPRO ) 20 MG tablet TAKE 1 TABLET BY MOUTH EVERY DAY 90 tablet 2   latanoprost  (XALATAN ) 0.005 % ophthalmic solution Place 1 drop into both eyes at bedtime.     metoprolol  tartrate (LOPRESSOR ) 25 MG tablet Take 1 tablet (25 mg total) by mouth 2 (two) times daily. 180 tablet 3   nitroGLYCERIN  (NITROSTAT ) 0.4 MG SL tablet Place 1 tablet  (0.4 mg total) under the tongue every 5 (five) minutes as needed. 25 tablet 3   OLANZapine  (ZYPREXA ) 2.5 MG tablet Take 1 tablet (2.5 mg total) by mouth 2 (two) times daily. And an additional tab in the morning 180 tablet 4   palbociclib  (IBRANCE ) 75 MG tablet Take 1 tablet (75 mg total) by mouth daily. Take for 21 days on, 7 days off, repeat every 28 days. 21 tablet 4   No current facility-administered medications for this visit.    REVIEW OF SYSTEMS:   Constitutional: Denies fevers, chills or abnormal weight loss Eyes: Denies blurriness of vision Ears, nose, mouth, throat, and face: Denies mucositis or sore throat Respiratory: Denies cough, dyspnea or wheezes Cardiovascular: Denies palpitation, chest discomfort or lower extremity swelling Gastrointestinal:  Denies nausea, heartburn or change in bowel habits Skin: Denies abnormal skin rashes Lymphatics: Denies new lymphadenopathy or easy bruising Neurological:Denies numbness, tingling  or new weaknesses Behavioral/Psych: Mood is stable, no new changes  All other systems were reviewed with the patient and are negative.   VITALS:  Blood pressure 133/64, pulse 72, temperature 98.4 F (36.9 C), temperature source Tympanic, resp. rate 18, height 5' 6 (1.676 m), weight 144 lb (65.3 kg), SpO2 95%.  Wt Readings from Last 3 Encounters:  09/15/24 144 lb (65.3 kg)  02/17/24 157 lb 6.5 oz (71.4 kg)  01/12/24 155 lb (70.3 kg)    Body mass index is 23.24 kg/m.  Performance status (ECOG): 2 - Symptomatic, <50% confined to bed  PHYSICAL EXAM:   GENERAL:alert, no distress and comfortable SKIN: skin color, texture, turgor are normal, no rashes or significant lesions LYMPH:  no palpable lymphadenopathy in the cervical, axillary or inguinal LUNGS: clear to auscultation and percussion with normal breathing effort HEART: regular rate & rhythm and no murmurs and no lower extremity edema ABDOMEN:abdomen soft, non-tender and normal bowel  sounds Musculoskeletal:no cyanosis of digits and no clubbing  NEURO: alert & oriented x 3 with fluent speech, no focal motor/sensory deficits  LABORATORY DATA:  I have reviewed the data as listed   Lab Results  Component Value Date   WBC 4.5 09/08/2024   NEUTROABS 2.1 09/08/2024   HGB 12.5 09/08/2024   HCT 37.1 09/08/2024   MCV 106.6 (H) 09/08/2024   PLT 145 (L) 09/08/2024     Chemistry      Component Value Date/Time   NA 140 09/08/2024 1251   K 4.1 09/08/2024 1251   CL 103 09/08/2024 1251   CO2 26 09/08/2024 1251   BUN 11 09/08/2024 1251   CREATININE 0.87 09/08/2024 1251   CREATININE 0.72 11/11/2016 1208      Component Value Date/Time   CALCIUM  9.1 09/08/2024 1251   ALKPHOS 47 09/08/2024 1251   AST 19 09/08/2024 1251   ALT 5 09/08/2024 1251   BILITOT 0.5 09/08/2024 1251      Latest Reference Range & Units 09/08/24 12:51  CA 15-3 0.0 - 25.0 U/mL 35.0 (H)  CA 27.29 0.0 - 38.6 U/mL 60.6 (H)  (H): Data is abnormally high  RADIOGRAPHIC STUDIES: I have personally reviewed the radiological images as listed and agreed with the findings in the report.  NM PET Image Restage (PS) Skull Base to Thigh (F-18 FDG) CLINICAL DATA:  Subsequent treatment strategy for breast cancer.  EXAM: NUCLEAR MEDICINE PET SKULL BASE TO THIGH  TECHNIQUE: 8.5 mCi F-18 FDG was injected intravenously. Full-ring PET imaging was performed from the skull base to thigh after the radiotracer. CT data was obtained and used for attenuation correction and anatomic localization.  Fasting blood glucose: 103 mg/dl  COMPARISON:  87/80/7975  FINDINGS: Mediastinal blood pool activity: SUV max 2.5  Liver activity: SUV max NA  NECK: No hypermetabolic lymph nodes in the neck.  Incidental CT findings: None.  CHEST: No hypermetabolic mediastinal or hilar nodes. No suspicious pulmonary nodules on the CT scan.  Incidental CT findings: Coronary artery calcification is evident. Moderate  atherosclerotic calcification is noted in the wall of the thoracic aorta. Enlargement of the pulmonary outflow tract and main pulmonary arteries suggests pulmonary arterial hypertension. Centrilobular emphysema.  ABDOMEN/PELVIS: No abnormal hypermetabolic activity within the liver, pancreas, adrenal glands, or spleen. No hypermetabolic lymph nodes in the abdomen or pelvis.  Incidental CT findings: Cholecystectomy. There is moderate atherosclerotic calcification of the abdominal aorta without aneurysm.  SKELETON: No focal hypermetabolic activity to suggest skeletal metastasis.  Incidental CT findings: Scattered lytic/lucent lesions  identified in the left clavicle, lower cervical spine, thoracolumbar spine, and left femoral head are stable in the interval without hypermetabolism.  IMPRESSION: 1. No evidence for hypermetabolic metastatic disease in the neck, chest, abdomen, or pelvis. 2. Stable scattered lytic/lucent lesions in the left clavicle, lower cervical spine, thoracolumbar spine, and left femoral head without hypermetabolism. Findings remain compatible with treated metastases. 3. Enlargement of the pulmonary outflow tract and main pulmonary arteries suggests pulmonary arterial hypertension. 4. Aortic Atherosclerosis (ICD10-I70.0) and Emphysema (ICD10-J43.9).  Electronically Signed   By: Camellia Candle M.D.   On: 05/26/2024 05:40

## 2024-09-19 ENCOUNTER — Encounter (HOSPITAL_COMMUNITY): Payer: Self-pay | Admitting: Oncology

## 2024-09-23 ENCOUNTER — Other Ambulatory Visit (HOSPITAL_COMMUNITY): Payer: Self-pay

## 2024-09-24 ENCOUNTER — Other Ambulatory Visit: Payer: Self-pay

## 2024-09-24 ENCOUNTER — Other Ambulatory Visit (HOSPITAL_COMMUNITY): Payer: Self-pay

## 2024-09-24 ENCOUNTER — Encounter (INDEPENDENT_AMBULATORY_CARE_PROVIDER_SITE_OTHER): Payer: Self-pay

## 2024-09-24 NOTE — Progress Notes (Signed)
 Specialty Pharmacy Refill Coordination Note  Kara Woods is a 89 y.o. female contacted today regarding refills of specialty medication(s) Palbociclib  (IBRANCE )   Patient requested Delivery   Delivery date: 09/29/24   Verified address: [address] [place] [address]   Medication will be filled on: 09/28/24

## 2024-09-27 ENCOUNTER — Other Ambulatory Visit: Payer: Self-pay

## 2024-09-30 ENCOUNTER — Telehealth: Payer: Self-pay | Admitting: Pharmacy Technician

## 2024-09-30 ENCOUNTER — Encounter (HOSPITAL_COMMUNITY): Payer: Self-pay | Admitting: Oncology

## 2024-09-30 ENCOUNTER — Other Ambulatory Visit (HOSPITAL_COMMUNITY): Payer: Self-pay

## 2024-09-30 NOTE — Telephone Encounter (Signed)
 Oral Oncology Patient Advocate Encounter  Was successful in securing patient a $7500 grant from [facility] to provide copayment coverage for ibrance .  This will keep the out of pocket expense at $0.     [employer] ID: [id number]   The billing information is as follows and has been shared with [facility].    RxBin: [id number] PCN: [id number] Member ID: [id number] Group ID: [id number] Dates of Eligibility: 08/31/2024 through 08/30/2025  Fund:  Breast Cancer - Medicare Access  [place] (Patty) Chet Burnet, CPhT  [facility], [name], [facility] - Oral Chemotherapy Patient Advocate Specialist III Phone: [phone]  Fax: [phone]

## 2024-09-30 NOTE — Telephone Encounter (Signed)
 Oral Oncology Patient Advocate Encounter  [employer] foundation is requiring that I submit a diagnoses verification form in order to fully approve this patient's grant.  I have emailed the form to Joesph Bohr, RN, to assist with obtaining Dr. Armanda signature.  Averil Digman (Patty) Chet Burnet, CPhT  [facility], [name], [facility] - Oral Chemotherapy Patient Advocate Specialist III Phone: [phone]  Fax: [phone]

## 2024-10-01 ENCOUNTER — Other Ambulatory Visit (HOSPITAL_COMMUNITY): Payer: Self-pay

## 2024-10-01 ENCOUNTER — Encounter (HOSPITAL_COMMUNITY): Payer: Self-pay | Admitting: Oncology

## 2024-10-01 NOTE — Telephone Encounter (Signed)
 Oral Oncology Patient Advocate Encounter  Diagnoses verification form has been completed and submitted to [employer] via the [employer] portal.  No further action needed.  Kara Woods (Patty) Chet Burnet, CPhT  [facility], [name], [facility] - Oral Chemotherapy Patient Advocate Specialist III Phone: [phone]  Fax: [phone]

## 2024-10-08 NOTE — Progress Notes (Signed)
 Patient daughter called asking if patient needs to come in next week for xgeva  injection. Per Dr. Armanda last visit note patient will be getting injection every 12 weeks, last injection was 10/22. Called daughter and left voice mail stating patient will not need to come in next week and won't be due for another 12 weeks after that date.

## 2024-10-13 ENCOUNTER — Inpatient Hospital Stay

## 2024-10-15 DIAGNOSIS — H0015 Chalazion left lower eyelid: Secondary | ICD-10-CM | POA: Diagnosis not present

## 2024-10-15 DIAGNOSIS — H04123 Dry eye syndrome of bilateral lacrimal glands: Secondary | ICD-10-CM | POA: Diagnosis not present

## 2024-10-15 DIAGNOSIS — H0012 Chalazion right lower eyelid: Secondary | ICD-10-CM | POA: Diagnosis not present

## 2024-10-19 ENCOUNTER — Other Ambulatory Visit: Payer: Self-pay

## 2024-10-22 ENCOUNTER — Other Ambulatory Visit: Payer: Self-pay

## 2024-10-22 NOTE — Progress Notes (Signed)
 Specialty Pharmacy Refill Coordination Note  Kara Woods is a 89 y.o. female contacted today regarding refills of specialty medication(s) Palbociclib  (IBRANCE )   Patient requested Delivery   Delivery date: 10/27/24   Verified address: [address] [place] [address]   Medication will be filled on: 10/26/24

## 2024-10-26 ENCOUNTER — Other Ambulatory Visit: Payer: Self-pay

## 2024-11-01 ENCOUNTER — Other Ambulatory Visit: Payer: Self-pay | Admitting: *Deleted

## 2024-11-01 DIAGNOSIS — C50919 Malignant neoplasm of unspecified site of unspecified female breast: Secondary | ICD-10-CM

## 2024-11-01 DIAGNOSIS — C50212 Malignant neoplasm of upper-inner quadrant of left female breast: Secondary | ICD-10-CM

## 2024-11-01 MED ORDER — ESCITALOPRAM OXALATE 20 MG PO TABS: 20.0000 mg | ORAL_TABLET | Freq: Every day | ORAL | 2 refills | Status: AC

## 2024-11-03 ENCOUNTER — Other Ambulatory Visit: Payer: Self-pay | Admitting: *Deleted

## 2024-11-03 MED ORDER — ANASTROZOLE 1 MG PO TABS: 1.0000 mg | ORAL_TABLET | Freq: Every day | ORAL | 2 refills | Status: AC

## 2024-11-10 ENCOUNTER — Inpatient Hospital Stay

## 2024-11-17 ENCOUNTER — Other Ambulatory Visit: Payer: Self-pay

## 2024-11-17 ENCOUNTER — Other Ambulatory Visit: Payer: Self-pay | Admitting: Hematology

## 2024-11-17 DIAGNOSIS — C50212 Malignant neoplasm of upper-inner quadrant of left female breast: Secondary | ICD-10-CM

## 2024-11-19 ENCOUNTER — Other Ambulatory Visit: Payer: Self-pay | Admitting: Hematology

## 2024-11-19 ENCOUNTER — Other Ambulatory Visit (HOSPITAL_COMMUNITY): Payer: Self-pay

## 2024-11-19 DIAGNOSIS — C50212 Malignant neoplasm of upper-inner quadrant of left female breast: Secondary | ICD-10-CM

## 2024-11-22 ENCOUNTER — Encounter: Payer: Self-pay | Admitting: *Deleted

## 2024-11-22 ENCOUNTER — Other Ambulatory Visit: Payer: Self-pay

## 2024-11-22 ENCOUNTER — Encounter (INDEPENDENT_AMBULATORY_CARE_PROVIDER_SITE_OTHER): Payer: Self-pay

## 2024-11-22 MED ORDER — PALBOCICLIB 75 MG PO TABS
75.0000 mg | ORAL_TABLET | Freq: Every day | ORAL | 4 refills | Status: AC
  Filled 2024-11-22: qty 21, 21d supply, fill #0
  Filled 2024-11-23: qty 21, 28d supply, fill #0
  Filled 2024-12-15: qty 21, 28d supply, fill #1

## 2024-11-23 ENCOUNTER — Other Ambulatory Visit: Payer: Self-pay

## 2024-11-23 NOTE — Progress Notes (Signed)
 Specialty Pharmacy Refill Coordination Note  Kara Woods is a 89 y.o. female contacted today regarding refills of specialty medication(s) Palbociclib  (IBRANCE )   Patient requested Delivery   Delivery date: 11/26/24   Verified address: (Patient-Rptd) [address] [place] [address]   Medication will be filled on: 11/24/24

## 2024-11-24 ENCOUNTER — Other Ambulatory Visit: Payer: Self-pay

## 2024-12-02 ENCOUNTER — Encounter (HOSPITAL_COMMUNITY)
Admission: RE | Admit: 2024-12-02 | Discharge: 2024-12-02 | Disposition: A | Discharge status: Home / Self Care | Source: Ambulatory Visit | Attending: Oncology | Admitting: Oncology

## 2024-12-02 ENCOUNTER — Inpatient Hospital Stay: Attending: Hematology

## 2024-12-02 DIAGNOSIS — Z170 Estrogen receptor positive status [ER+]: Secondary | ICD-10-CM | POA: Insufficient documentation

## 2024-12-02 DIAGNOSIS — C7951 Secondary malignant neoplasm of bone: Secondary | ICD-10-CM | POA: Insufficient documentation

## 2024-12-02 DIAGNOSIS — Z923 Personal history of irradiation: Secondary | ICD-10-CM | POA: Diagnosis not present

## 2024-12-02 DIAGNOSIS — F419 Anxiety disorder, unspecified: Secondary | ICD-10-CM | POA: Insufficient documentation

## 2024-12-02 DIAGNOSIS — Z79811 Long term (current) use of aromatase inhibitors: Secondary | ICD-10-CM | POA: Insufficient documentation

## 2024-12-02 DIAGNOSIS — C50212 Malignant neoplasm of upper-inner quadrant of left female breast: Secondary | ICD-10-CM | POA: Insufficient documentation

## 2024-12-02 DIAGNOSIS — Z807 Family history of other malignant neoplasms of lymphoid, hematopoietic and related tissues: Secondary | ICD-10-CM | POA: Diagnosis not present

## 2024-12-02 DIAGNOSIS — C50919 Malignant neoplasm of unspecified site of unspecified female breast: Secondary | ICD-10-CM | POA: Insufficient documentation

## 2024-12-02 DIAGNOSIS — Z87891 Personal history of nicotine dependence: Secondary | ICD-10-CM | POA: Insufficient documentation

## 2024-12-02 DIAGNOSIS — Z79899 Other long term (current) drug therapy: Secondary | ICD-10-CM | POA: Insufficient documentation

## 2024-12-02 DIAGNOSIS — F32A Depression, unspecified: Secondary | ICD-10-CM | POA: Diagnosis not present

## 2024-12-02 LAB — CBC WITH DIFFERENTIAL/PLATELET
Abs Immature Granulocytes: 0.01 K/uL (ref 0.00–0.07)
Basophils Absolute: 0.1 K/uL (ref 0.0–0.1)
Basophils Relative: 2 %
Eosinophils Absolute: 0 K/uL (ref 0.0–0.5)
Eosinophils Relative: 1 %
HCT: 37.8 % (ref 36.0–46.0)
Hemoglobin: 12.5 g/dL (ref 12.0–15.0)
Immature Granulocytes: 0 %
Lymphocytes Relative: 47 %
Lymphs Abs: 1.6 K/uL (ref 0.7–4.0)
MCH: 35.9 pg — ABNORMAL HIGH (ref 26.0–34.0)
MCHC: 33.1 g/dL (ref 30.0–36.0)
MCV: 108.6 fL — ABNORMAL HIGH (ref 80.0–100.0)
Monocytes Absolute: 0.3 K/uL (ref 0.1–1.0)
Monocytes Relative: 9 %
Neutro Abs: 1.4 K/uL — ABNORMAL LOW (ref 1.7–7.7)
Neutrophils Relative %: 41 %
Platelets: 139 K/uL — ABNORMAL LOW (ref 150–400)
RBC: 3.48 MIL/uL — ABNORMAL LOW (ref 3.87–5.11)
RDW: 14.6 % (ref 11.5–15.5)
Smear Review: NORMAL
WBC: 3.4 K/uL — ABNORMAL LOW (ref 4.0–10.5)
nRBC: 0 % (ref 0.0–0.2)

## 2024-12-02 LAB — COMPREHENSIVE METABOLIC PANEL WITH GFR
ALT: 5 U/L (ref 0–44)
AST: 17 U/L (ref 15–41)
Albumin: 4.2 g/dL (ref 3.5–5.0)
Alkaline Phosphatase: 44 U/L (ref 38–126)
Anion gap: 14 (ref 5–15)
BUN: 10 mg/dL (ref 8–23)
CO2: 23 mmol/L (ref 22–32)
Calcium: 8.8 mg/dL — ABNORMAL LOW (ref 8.9–10.3)
Chloride: 105 mmol/L (ref 98–111)
Creatinine, Ser: 0.77 mg/dL (ref 0.44–1.00)
GFR, Estimated: >60 mL/min
Glucose, Bld: 85 mg/dL (ref 70–99)
Potassium: 3.9 mmol/L (ref 3.5–5.1)
Sodium: 141 mmol/L (ref 135–145)
Total Bilirubin: 0.5 mg/dL (ref 0.0–1.2)
Total Protein: 7.3 g/dL (ref 6.5–8.1)

## 2024-12-02 MED ORDER — FLUDEOXYGLUCOSE F - 18 (FDG) INJECTION
7.2600 | Freq: Once | INTRAVENOUS | Status: AC | PRN
  Administered 2024-12-02: 7.26 via INTRAVENOUS

## 2024-12-03 LAB — CANCER ANTIGEN 27.29: CA 27.29: 30.7 U/mL (ref 0.0–38.6)

## 2024-12-03 LAB — CANCER ANTIGEN 15-3: CA 15-3: 29.3 U/mL — ABNORMAL HIGH (ref 0.0–25.0)

## 2024-12-08 NOTE — Progress Notes (Signed)
 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  Patient did not tolerate endocrine therapy at that time. Recurrence in 2021 with elevated CEA and responded well to Ibrance  and anastrozole . Oncology history below Recent PET scan from 05/2024 with no evidence of metastatic disease but stable lytic bone lesions.  - We reviewed labs today: CMP: WNL, Calcium : 8.8, Normal LFTs, CBC: WBC:3.4, normal hemoglobin, MCV: 108.6, platelets: 139 - We reviewed the PET scan findings together. No evidence of disease - Tolerating Ibrance  and anastrozole  well with no side effects. - Continue Ibrance  75 mg 3 weeks on/1 week off.  Continue anastrozole  1 mg daily - CA 15-3 and CA 27.29: down trending. Will repeat in 3 months.  - Will repeat a PET scan in 6 months that is 05/2025  Return to clinic in 3 months with labs  Metastatic disease to bone Currently on Xgeva  every 12 weeks  - Continue Xgeva  every 12 weeks. Due today. Monitor calcium  levels - Continue vitamin D and calcium  supplementation  Depression/anxiety -Continue Lexapro  and clorazepate  for anxiety. Symptoms are well-controlled.    The patient understands the plans discussed today and is in agreement with them.  She knows to contact our office if she develops concerns prior to her next appointment.  13 minutes of  total time was spent for this patient encounter, including preparation,review of records,  face-to-face counseling with the patient and coordination of care, physical exam, and documentation of the encounter.   [name],acting as a neurosurgeon for Mickiel Dry, MD.,have documented all relevant documentation on the behalf of Mickiel Dry, MD,as directed by  Mickiel Dry, MD while in the presence of Mickiel Dry, MD.  I, Mickiel Dry MD, have reviewed the above documentation for accuracy and completeness, and I agree with the above.      Mickiel Dry, MD  [facility] [place] - A DEPT OF [name] [address] [place] [address] Dept: [phone] Dept Fax: [phone]   No orders of the defined types were placed in this encounter.    ONCOLOGY HISTORY:   I have reviewed her chart and materials related to her cancer extensively and collaborated history with the patient. 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  Pathology: Invasive mammary carcinoma. Tumor cells are ER positive (100%) and PR positive (26%) by IHC. Tumor is Her2 negative by FISH. Ki-67 is 18%. -05/03/2013: Left lumpectomy.  Pathology: Invasive lobular carcinoma, 1.4 cm. Lymphovascular invasion identified. Invasive tumor is focally present at the inferior and posterior margins, and broadly present at the anterior margin. One axillary lymph node negative for metastatic carcinoma (0/1). Staging: pT1c, pN0. -05/19/2013: Re-excision of left lumpectomy.  Pathology: No residual carcinoma. Margins clear.  -  05/2013??: XRT to left breast area -Patient did not take adjuvant endocrine therapy for poor tolerance -07/13/2020: Seen in clinic for elevated CEA. -07/13/2020: Diagnostic Bilateral Mammogram: No mammographic evidence of  malignancy in the bilateral breasts or other finding to explain the patient's diffuse bilateral breast pain. -07/24/2020: MRI Brain: NED -08/07/2020: Initial PET: Widespread hypermetabolic osseous metastasis, including lesions with soft tissue components at T1 and the sacrum. Thoracic nodal metastasis. No other evidence of soft tissue metastasis. -08/15/2020: MRI Thoracic spine: As seen on recent PET-CT, there are multiple osseous metastases within the thoracic spine and posterior ribs bilaterally. Associated pathologic fracture of T1 with mild osseous retropulsion and possible left C8 nerve root encroachment within the foramen at C7-T1. No cord deformity. Mild pathologic fracture of the superior endplate of T7. No significant epidural or paraspinal tumor at the other levels. -08/15/2020: MRI Lumbar spine: Multilevel osseous metastases within the lumbar spine and upper pelvis as seen on recent PET-CT. No evidence of pathologic fracture or epidural tumor. -08/22/2020: Right 3rd rib biopsy.  Pathology: Metastatic carcinoma, consistent with patient's clinical history of primary breast carcinoma. Tumor cells are Her2 equivocal (2+), ER positive (100%), and PR positive (40%) by IHC. Her2 negative by FISH. Ki-67 is 10%.   -08/28/2020: CA 15-3 elevated at 51.3. CA 27.29 elevated at 57.1.  -08/31/2020-current: Ibrance  100 mg 3 weeks on/1 week off and Anastrozole  1 mg daily, Ibrance  held on 10/13/2020 secondary to fatigue and low blood counts and subsequently dose reduced to 75 mg -10/12/2020-current: Xgeva  every 4 weeks, transitioned to every 12 weeks on 06/09/2024 -02/19/2021: PET: Interval response to therapy. Previous index skeletal lesions have decreased in size and degree of FDG uptake. Resolution of previous FDG avid mediastinal and left internal mammary lymph nodes. New small focal area of subpleural nodularity within the lateral right base exhibits mild FDG uptake. Cannot exclude new site of  disease. -08/09/2021-current: PET scan show stable to no evidence of disease -2023-current: Cancer markers remain relatively stable, though range from the upper limit of normal to slightly elevated   Current Treatment:  Ibrance  75mg  3 weeks on/1 week off. Anastrozole  1mg  daily  INTERVAL HISTORY:   JOSIAH WOJTASZEK is here today for follow-up of metastatic breast cancer. She is accompanied by her daughter today.   Deshaun is tolerating Ibrance  and anastrozole  well. She denies any hot flashes or diarrhea. She does not occasional constipation.   We discussed PET scan results and labs, which show no evidence of disease and are stable.   I have reviewed the past medical history, past surgical history, social history and family history with the patient and they are unchanged from previous note.  ALLERGIES:  is allergic to contrast media [iodinated contrast media], lipitor  [atorvastatin ], sulfamethoxazole, and sulfonamide derivatives.  MEDICATIONS:  Current Outpatient Medications  Medication Sig Dispense Refill   acetaminophen  (TYLENOL ) 325 MG tablet Take 2 tablets (650 mg total) by mouth every 6 (six) hours as needed for mild pain or headache (or temp > 37.5 C (99.5 F)). 40 tablet 0   anastrozole  (ARIMIDEX ) 1 MG tablet Take 1 tablet (1 mg total) by mouth daily. 90 tablet 2   aspirin  81 MG EC tablet Take 1 tablet (81 mg total) by mouth daily. Swallow whole. 60 tablet 2   Calcium -Phosphorus-Vitamin D (CALCIUM  GUMMIES PO) Take by mouth 2 (two) times daily.     carboxymethylcellulose (REFRESH PLUS) 0.5 % SOLN Place 3-4 drops into both eyes 3 (three) times daily as needed (dry eyes). Preservative free  clorazepate  (TRANXENE ) 3.75 MG tablet Take 1 tablet (3.75 mg total) by mouth 2 (two) times daily as needed for anxiety. 60 tablet 0   escitalopram  (LEXAPRO ) 20 MG tablet Take 1 tablet (20 mg total) by mouth daily. 90 tablet 2   latanoprost  (XALATAN ) 0.005 % ophthalmic solution Place 1 drop into  both eyes at bedtime.     metoprolol  tartrate (LOPRESSOR ) 25 MG tablet Take 1 tablet (25 mg total) by mouth 2 (two) times daily. 180 tablet 3   nitroGLYCERIN  (NITROSTAT ) 0.4 MG SL tablet Place 1 tablet (0.4 mg total) under the tongue every 5 (five) minutes as needed. 25 tablet 3   OLANZapine  (ZYPREXA ) 2.5 MG tablet Take 1 tablet (2.5 mg total) by mouth 2 (two) times daily. And an additional tab in the morning 180 tablet 4   palbociclib  (IBRANCE ) 75 MG tablet Take 1 tablet (75 mg total) by mouth daily. Take for 21 days on, 7 days off, repeat every 28 days. 21 tablet 4   No current facility-administered medications for this visit.   Facility-Administered Medications Ordered in Other Visits  Medication Dose Route Frequency Provider Last Rate Last Admin   denosumab  (XGEVA ) injection 120 mg  120 mg Subcutaneous Once Dynastee Brummell, MD         VITALS:  Blood pressure (!) 105/59, pulse 88, temperature (!) 97.5 F (36.4 C), temperature source Oral, resp. rate 20, weight 145 lb 1 oz (65.8 kg), SpO2 96%.  Wt Readings from Last 3 Encounters:  12/09/24 145 lb 1 oz (65.8 kg)  09/15/24 144 lb (65.3 kg)  02/17/24 157 lb 6.5 oz (71.4 kg)    Body mass index is 23.41 kg/m.  Performance status (ECOG): 2 - Symptomatic, <50% confined to bed  PHYSICAL EXAM:   GENERAL:alert, no distress and comfortable SKIN: skin color, texture, turgor are normal, no rashes or significant lesions LYMPH:  no palpable lymphadenopathy in the cervical, axillary or inguinal LUNGS: clear to auscultation and percussion with normal breathing effort HEART: regular rate & rhythm and no murmurs and no lower extremity edema ABDOMEN:abdomen soft, non-tender and normal bowel sounds Musculoskeletal:no cyanosis of digits and no clubbing  NEURO: alert & oriented x 3 with fluent speech  LABORATORY DATA:  I have reviewed the data as listed   Lab Results  Component Value Date   WBC 3.4 (L) 12/02/2024   NEUTROABS 1.4 (L)  12/02/2024   HGB 12.5 12/02/2024   HCT 37.8 12/02/2024   MCV 108.6 (H) 12/02/2024   PLT 139 (L) 12/02/2024     Chemistry      Component Value Date/Time   NA 141 12/02/2024 0909   K 3.9 12/02/2024 0909   CL 105 12/02/2024 0909   CO2 23 12/02/2024 0909   BUN 10 12/02/2024 0909   CREATININE 0.77 12/02/2024 0909   CREATININE 0.72 11/11/2016 1208      Component Value Date/Time   CALCIUM  8.8 (L) 12/02/2024 0909   ALKPHOS 44 12/02/2024 0909   AST 17 12/02/2024 0909   ALT 5 12/02/2024 0909   BILITOT 0.5 12/02/2024 0909      Latest Reference Range & Units 12/02/24 09:09  CA 15-3 0.0 - 25.0 U/mL 29.3 (H)  CA 27.29 0.0 - 38.6 U/mL 30.7  (H): Data is abnormally high  RADIOGRAPHIC STUDIES: I have personally reviewed the radiological images as listed and agreed with the findings in the report.  NM PET Image Restag (PS) Skull Base To Thigh CLINICAL DATA:  Subsequent treatment strategy for breast  cancer.  EXAM: NUCLEAR MEDICINE PET SKULL BASE TO THIGH  TECHNIQUE: 7.3 mCi F-18 FDG was injected intravenously. Full-ring PET imaging was performed from the skull base to thigh after the radiotracer. CT data was obtained and used for attenuation correction and anatomic localization.  Fasting blood glucose: 98 mg/dl  COMPARISON:  93/73/7974.  FINDINGS: Mediastinal blood pool activity: SUV max 2.8  Liver activity: SUV max NA  NECK:  No abnormal hypermetabolism.  Incidental CT findings:  Mild cerebral atrophy.  CHEST:  No abnormal hypermetabolism.  Incidental CT findings:  Atherosclerotic calcification of the aorta, aortic valve and coronary arteries. Enlarged pulmonic trunk and heart. No pericardial or pleural effusion. Centrilobular emphysema. Scattered pulmonary parenchymal scarring.  ABDOMEN/PELVIS:  No abnormal hypermetabolism.  Incidental CT findings:  Cholecystectomy.  SKELETON:  Scattered lucent lesions are again seen but without  abnormal hypermetabolism.  Incidental CT findings:  Degenerative changes in the spine. Old bilateral rib fractures.  IMPRESSION: 1. No evidence of recurrent or metastatic disease. 2. Quiescent osseous metastatic disease. 3. Aortic atherosclerosis (ICD10-I70.0). Coronary artery calcification. 4. Enlarged pulmonic trunk, indicative of pulmonary arterial hypertension. 5.  Emphysema (ICD10-J43.9).  Electronically Signed   By: Newell Eke M.D.   On: 12/08/2024 13:19    "

## 2024-12-09 ENCOUNTER — Inpatient Hospital Stay

## 2024-12-09 ENCOUNTER — Inpatient Hospital Stay: Admitting: Oncology

## 2024-12-09 VITALS — BP 105/59 | HR 88 | Temp 97.5°F | Resp 20 | Wt 145.1 lb

## 2024-12-09 DIAGNOSIS — C50212 Malignant neoplasm of upper-inner quadrant of left female breast: Secondary | ICD-10-CM

## 2024-12-09 DIAGNOSIS — F320 Major depressive disorder, single episode, mild: Secondary | ICD-10-CM

## 2024-12-09 DIAGNOSIS — C50919 Malignant neoplasm of unspecified site of unspecified female breast: Secondary | ICD-10-CM

## 2024-12-09 DIAGNOSIS — C7951 Secondary malignant neoplasm of bone: Secondary | ICD-10-CM | POA: Diagnosis not present

## 2024-12-09 DIAGNOSIS — Z853 Personal history of malignant neoplasm of breast: Secondary | ICD-10-CM

## 2024-12-09 DIAGNOSIS — Z170 Estrogen receptor positive status [ER+]: Secondary | ICD-10-CM

## 2024-12-09 MED ORDER — DENOSUMAB 120 MG/1.7ML ~~LOC~~ SOLN
120.0000 mg | Freq: Once | SUBCUTANEOUS | Status: AC
  Administered 2024-12-09: 120 mg via SUBCUTANEOUS
  Filled 2024-12-09: qty 1.7

## 2024-12-09 NOTE — Patient Instructions (Signed)
 [facility] - A DEPT OF [facility]  Discharge Instructions: Thank you for choosing [facility] to provide your oncology and hematology care.  If you have a lab appointment with the Cancer Center - please note that after April 8th, 2024, all labs will be drawn in the cancer center.  You do not have to check in or register with the main entrance as you have in the past but will complete your check-in in the cancer center.  Wear comfortable clothing and clothing appropriate for easy access to any Portacath or PICC line.   We strive to give you quality time with your provider. You may need to reschedule your appointment if you arrive late (15 or more minutes).  Arriving late affects you and other patients whose appointments are after yours.  Also, if you miss three or more appointments without notifying the office, you may be dismissed from the clinic at the provider's discretion.      For prescription refill requests, have your pharmacy contact our office and allow 72 hours for refills to be completed.    Today you received the following  Xgeva, return as scheduled.   To help prevent nausea and vomiting after your treatment, we encourage you to take your nausea medication as directed.  BELOW ARE SYMPTOMS THAT SHOULD BE REPORTED IMMEDIATELY: *FEVER GREATER THAN 100.4 F (38 C) OR HIGHER *CHILLS OR SWEATING *NAUSEA AND VOMITING THAT IS NOT CONTROLLED WITH YOUR NAUSEA MEDICATION *UNUSUAL SHORTNESS OF BREATH *UNUSUAL BRUISING OR BLEEDING *URINARY PROBLEMS (pain or burning when urinating, or frequent urination) *BOWEL PROBLEMS (unusual diarrhea, constipation, pain near the anus) TENDERNESS IN MOUTH AND THROAT WITH OR WITHOUT PRESENCE OF ULCERS (sore throat, sores in mouth, or a toothache) UNUSUAL RASH, SWELLING OR PAIN  UNUSUAL VAGINAL DISCHARGE OR ITCHING   Items with * indicate a potential emergency and should be followed up as soon as possible or  go to the Emergency Department if any problems should occur.  Please show the CHEMOTHERAPY ALERT CARD or IMMUNOTHERAPY ALERT CARD at check-in to the Emergency Department and triage nurse.  Should you have questions after your visit or need to cancel or reschedule your appointment, please contact [facility] [place] - A DEPT OF [facility] [phone]  and follow the prompts.  Office hours are 8:00 a.m. to 4:30 p.m. Monday - Friday. Please note that voicemails left after 4:00 p.m. may not be returned until the following business day.  We are closed weekends and major holidays. You have access to a nurse at all times for urgent questions. Please call the main number to the clinic [phone] and follow the prompts.  For any non-urgent questions, you may also contact your provider using MyChart. We now offer e-Visits for anyone 56 and older to request care online for non-urgent symptoms. For details visit [email or web address].   Also download the MyChart app! Go to the app store, search "MyChart", open the app, select [place], and log in with your MyChart username and password.

## 2024-12-09 NOTE — Progress Notes (Signed)
Patient taking calcium as directed. Denied tooth, jaw, and leg pain. No recent or upcoming dental visits. Labs reviewed. Patient tolerated injection with no complaints voiced. See MAR for details. Patient stable during and after injection. Site clean and dry with no bruising or swelling noted. Band aid applied. Vss with discharge and left in satisfactory condition with no s/s of distress.  

## 2024-12-09 NOTE — Patient Instructions (Signed)
 [facility] at [facility] Discharge Instructions   You were seen and examined today by Dr. Davonna.  She reviewed the results of your lab work which are normal/stable.   She reviewed the results of your PET scan which did not show any evidence of cancer.  We will .   Return as scheduled.    Thank you for choosing [facility] at [facility] to provide your oncology and hematology care.  To afford each patient quality time with our provider, please arrive at least 15 minutes before your scheduled appointment time.   If you have a lab appointment with the Cancer Center please come in thru the Main Entrance and check in at the main information desk.  You need to re-schedule your appointment should you arrive 10 or more minutes late.  We strive to give you quality time with our providers, and arriving late affects you and other patients whose appointments are after yours.  Also, if you no show three or more times for appointments you may be dismissed from the clinic at the providers discretion.     Again, thank you for choosing [facility].  Our hope is that these requests will decrease the amount of time that you wait before being seen by our physicians.       _____________________________________________________________  Should you have questions after your visit to [facility], please contact our office at [phone] and follow the prompts.  Our office hours are 8:00 a.m. and 4:30 p.m. Monday - Friday.  Please note that voicemails left after 4:00 p.m. may not be returned until the following business day.  We are closed weekends and major holidays.  You do have access to a nurse 24-7, just call the main number to the clinic [phone] and do not press any options, hold on the line and a nurse will answer the phone.    For prescription refill requests, have your pharmacy contact our office and allow 72 hours.    Due  to Covid, you will need to wear a mask upon entering the hospital. If you do not have a mask, a mask will be given to you at the Main Entrance upon arrival. For doctor visits, patients may have 1 support person age 72 or older with them. For treatment visits, patients can not have anyone with them due to social distancing guidelines and our immunocompromised population.

## 2024-12-09 NOTE — Progress Notes (Signed)
Patient is taking Ibrance as prescribed.  She has not missed any doses and reports no side effects at this time.   

## 2024-12-15 ENCOUNTER — Other Ambulatory Visit: Payer: Self-pay

## 2024-12-16 ENCOUNTER — Other Ambulatory Visit: Payer: Self-pay

## 2024-12-16 NOTE — Progress Notes (Signed)
 Specialty Pharmacy Refill Coordination Note  Kara Woods is a 89 y.o. female contacted today regarding refills of specialty medication(s) Palbociclib  (IBRANCE )   Patient requested (Patient-Rptd) Delivery   Delivery date: 12/24/24   Verified address: (Patient-Rptd) [address] [place] [address]   Medication will be filled on: 12/23/24

## 2024-12-23 ENCOUNTER — Other Ambulatory Visit: Payer: Self-pay

## 2025-02-24 ENCOUNTER — Inpatient Hospital Stay

## 2025-03-03 ENCOUNTER — Inpatient Hospital Stay

## 2025-03-03 ENCOUNTER — Inpatient Hospital Stay: Admitting: Oncology
